# Patient Record
Sex: Female | Born: 1960
Health system: Southern US, Community
[De-identification: ages and names within clinical notes are randomized; demographics above are authoritative.]

## PROBLEM LIST (undated history)

## (undated) DIAGNOSIS — M1990 Unspecified osteoarthritis, unspecified site: Secondary | ICD-10-CM

## (undated) DIAGNOSIS — G4730 Sleep apnea, unspecified: Secondary | ICD-10-CM

## (undated) DIAGNOSIS — I219 Acute myocardial infarction, unspecified: Secondary | ICD-10-CM

## (undated) DIAGNOSIS — M359 Systemic involvement of connective tissue, unspecified: Secondary | ICD-10-CM

## (undated) DIAGNOSIS — E785 Hyperlipidemia, unspecified: Secondary | ICD-10-CM

## (undated) DIAGNOSIS — K635 Polyp of colon: Secondary | ICD-10-CM

## (undated) DIAGNOSIS — K219 Gastro-esophageal reflux disease without esophagitis: Secondary | ICD-10-CM

## (undated) DIAGNOSIS — Z9981 Dependence on supplemental oxygen: Secondary | ICD-10-CM

## (undated) DIAGNOSIS — I10 Essential (primary) hypertension: Secondary | ICD-10-CM

## (undated) DIAGNOSIS — I2510 Atherosclerotic heart disease of native coronary artery without angina pectoris: Secondary | ICD-10-CM

## (undated) DIAGNOSIS — K2270 Barrett's esophagus without dysplasia: Secondary | ICD-10-CM

## (undated) DIAGNOSIS — R079 Chest pain, unspecified: Secondary | ICD-10-CM

## (undated) DIAGNOSIS — F419 Anxiety disorder, unspecified: Secondary | ICD-10-CM

## (undated) DIAGNOSIS — M5430 Sciatica, unspecified side: Secondary | ICD-10-CM

## (undated) DIAGNOSIS — F32A Depression, unspecified: Secondary | ICD-10-CM

## (undated) DIAGNOSIS — Z720 Tobacco use: Secondary | ICD-10-CM

## (undated) DIAGNOSIS — J45909 Unspecified asthma, uncomplicated: Secondary | ICD-10-CM

## (undated) DIAGNOSIS — F329 Major depressive disorder, single episode, unspecified: Secondary | ICD-10-CM

## (undated) HISTORY — PX: COLONOSCOPY W/ POLYPECTOMY: SHX1380

## (undated) HISTORY — PX: DILATION AND CURETTAGE, DIAGNOSTIC / THERAPEUTIC: SUR384

## (undated) HISTORY — PX: COLONOSCOPY: SHX174

## (undated) HISTORY — DX: Barrett's esophagus without dysplasia: K22.70

## (undated) HISTORY — DX: Polyp of colon: K63.5

## (undated) HISTORY — DX: Tobacco use: Z72.0

## (undated) HISTORY — DX: Chest pain, unspecified: R07.9

## (undated) HISTORY — DX: Hyperlipidemia, unspecified: E78.5

---

## 2003-10-09 HISTORY — PX: CARDIAC CATHETERIZATION: SHX172

## 2007-05-06 ENCOUNTER — Emergency Department (HOSPITAL_COMMUNITY): Admission: EM | Admit: 2007-05-06 | Discharge: 2007-05-06 | Payer: Self-pay | Admitting: Emergency Medicine

## 2007-07-25 ENCOUNTER — Emergency Department (HOSPITAL_COMMUNITY): Admission: EM | Admit: 2007-07-25 | Discharge: 2007-07-25 | Payer: Self-pay | Admitting: Emergency Medicine

## 2007-09-21 ENCOUNTER — Encounter (INDEPENDENT_AMBULATORY_CARE_PROVIDER_SITE_OTHER): Payer: Self-pay | Admitting: Family Medicine

## 2007-09-21 ENCOUNTER — Ambulatory Visit: Payer: Self-pay | Admitting: Internal Medicine

## 2007-09-21 LAB — CONVERTED CEMR LAB
BUN: 13 mg/dL (ref 6–23)
Basophils Relative: 0 % (ref 0–1)
CO2: 22 meq/L (ref 19–32)
Calcium: 8.9 mg/dL (ref 8.4–10.5)
Chloride: 106 meq/L (ref 96–112)
Cholesterol: 221 mg/dL — ABNORMAL HIGH (ref 0–200)
Creatinine, Ser: 1.06 mg/dL (ref 0.40–1.20)
Eosinophils Relative: 1 % (ref 0–5)
HCT: 43.6 % (ref 36.0–46.0)
HDL: 50 mg/dL (ref >39)
Hemoglobin: 14.5 g/dL (ref 12.0–15.0)
Lymphocytes Relative: 18 % (ref 12–46)
MCHC: 33.3 g/dL (ref 30.0–36.0)
Monocytes Absolute: 0.5 10*3/uL (ref 0.2–0.7)
Monocytes Relative: 5 % (ref 3–11)
Neutro Abs: 7.8 10*3/uL — ABNORMAL HIGH (ref 1.7–7.7)
RBC: 5.02 M/uL (ref 3.87–5.11)
Total Bilirubin: 0.5 mg/dL (ref 0.3–1.2)
Total CHOL/HDL Ratio: 4.4

## 2007-09-23 ENCOUNTER — Ambulatory Visit: Payer: Self-pay | Admitting: *Deleted

## 2007-10-09 ENCOUNTER — Inpatient Hospital Stay (HOSPITAL_COMMUNITY): Admission: EM | Admit: 2007-10-09 | Discharge: 2007-10-12 | Payer: Self-pay | Admitting: Emergency Medicine

## 2007-10-10 HISTORY — PX: CARDIAC CATHETERIZATION: SHX172

## 2007-10-28 ENCOUNTER — Inpatient Hospital Stay (HOSPITAL_COMMUNITY): Admission: EM | Admit: 2007-10-28 | Discharge: 2007-11-02 | Payer: Self-pay | Admitting: Emergency Medicine

## 2007-10-31 HISTORY — PX: CORONARY ANGIOPLASTY WITH STENT PLACEMENT: SHX49

## 2007-11-07 ENCOUNTER — Ambulatory Visit: Payer: Self-pay | Admitting: Internal Medicine

## 2007-11-07 LAB — CONVERTED CEMR LAB
ALT: 15 units/L (ref 0–35)
Albumin: 4.4 g/dL (ref 3.5–5.2)
BUN: 21 mg/dL (ref 6–23)
CO2: 25 meq/L (ref 19–32)
Chloride: 105 meq/L (ref 96–112)
Cholesterol: 201 mg/dL — ABNORMAL HIGH (ref 0–200)
Creatinine, Ser: 1.02 mg/dL (ref 0.40–1.20)
Glucose, Bld: 85 mg/dL (ref 70–99)
HDL: 56 mg/dL (ref >39)
Sodium: 141 meq/L (ref 135–145)
Total Bilirubin: 0.4 mg/dL (ref 0.3–1.2)
Total CHOL/HDL Ratio: 3.6
Triglycerides: 114 mg/dL (ref <150)
VLDL: 23 mg/dL (ref 0–40)

## 2008-02-08 ENCOUNTER — Ambulatory Visit: Payer: Self-pay | Admitting: Internal Medicine

## 2008-02-08 ENCOUNTER — Encounter: Payer: Self-pay | Admitting: Family Medicine

## 2008-02-28 ENCOUNTER — Ambulatory Visit (HOSPITAL_COMMUNITY): Admission: RE | Admit: 2008-02-28 | Discharge: 2008-02-28 | Payer: Self-pay | Admitting: Family Medicine

## 2008-03-23 ENCOUNTER — Emergency Department (HOSPITAL_COMMUNITY): Admission: EM | Admit: 2008-03-23 | Discharge: 2008-03-23 | Payer: Self-pay | Admitting: Emergency Medicine

## 2008-05-26 ENCOUNTER — Inpatient Hospital Stay (HOSPITAL_COMMUNITY): Admission: EM | Admit: 2008-05-26 | Discharge: 2008-05-30 | Payer: Self-pay | Admitting: Emergency Medicine

## 2008-05-28 HISTORY — PX: CARDIAC CATHETERIZATION: SHX172

## 2008-06-04 ENCOUNTER — Ambulatory Visit: Payer: Self-pay | Admitting: Family Medicine

## 2008-06-18 ENCOUNTER — Emergency Department (HOSPITAL_COMMUNITY): Admission: EM | Admit: 2008-06-18 | Discharge: 2008-06-18 | Payer: Self-pay | Admitting: Emergency Medicine

## 2008-07-16 ENCOUNTER — Emergency Department (HOSPITAL_COMMUNITY): Admission: EM | Admit: 2008-07-16 | Discharge: 2008-07-16 | Payer: Self-pay | Admitting: Emergency Medicine

## 2009-01-06 ENCOUNTER — Emergency Department (HOSPITAL_COMMUNITY): Admission: EM | Admit: 2009-01-06 | Discharge: 2009-01-06 | Payer: Self-pay | Admitting: Emergency Medicine

## 2009-02-04 ENCOUNTER — Ambulatory Visit: Payer: Self-pay | Admitting: Internal Medicine

## 2009-02-04 ENCOUNTER — Encounter (INDEPENDENT_AMBULATORY_CARE_PROVIDER_SITE_OTHER): Payer: Self-pay | Admitting: Adult Health

## 2009-02-04 LAB — CONVERTED CEMR LAB
Alkaline Phosphatase: 72 units/L (ref 39–117)
Basophils Absolute: 0 10*3/uL (ref 0.0–0.1)
Basophils Relative: 0 % (ref 0–1)
Glucose, Bld: 75 mg/dL (ref 70–99)
HDL: 55 mg/dL (ref >39)
Hemoglobin: 14.7 g/dL (ref 12.0–15.0)
LDL Cholesterol: 129 mg/dL — ABNORMAL HIGH (ref 0–99)
MCHC: 34.1 g/dL (ref 30.0–36.0)
Monocytes Absolute: 0.5 10*3/uL (ref 0.1–1.0)
Neutro Abs: 5.9 10*3/uL (ref 1.7–7.7)
Neutrophils Relative %: 72 % (ref 43–77)
RDW: 15.7 % — ABNORMAL HIGH (ref 11.5–15.5)
Sodium: 138 meq/L (ref 135–145)
Total Bilirubin: 0.3 mg/dL (ref 0.3–1.2)
Total CHOL/HDL Ratio: 4.1
Total Protein: 7.4 g/dL (ref 6.0–8.3)
Triglycerides: 220 mg/dL — ABNORMAL HIGH (ref <150)
VLDL: 44 mg/dL — ABNORMAL HIGH (ref 0–40)

## 2009-02-07 ENCOUNTER — Ambulatory Visit: Payer: Self-pay | Admitting: Internal Medicine

## 2009-02-25 ENCOUNTER — Ambulatory Visit: Payer: Self-pay | Admitting: Internal Medicine

## 2009-02-28 ENCOUNTER — Ambulatory Visit (HOSPITAL_COMMUNITY): Admission: RE | Admit: 2009-02-28 | Discharge: 2009-02-28 | Payer: Self-pay | Admitting: Internal Medicine

## 2009-05-10 ENCOUNTER — Encounter (INDEPENDENT_AMBULATORY_CARE_PROVIDER_SITE_OTHER): Payer: Self-pay | Admitting: Adult Health

## 2009-05-10 ENCOUNTER — Ambulatory Visit: Payer: Self-pay | Admitting: Internal Medicine

## 2009-05-15 ENCOUNTER — Emergency Department (HOSPITAL_COMMUNITY): Admission: EM | Admit: 2009-05-15 | Discharge: 2009-05-15 | Payer: Self-pay | Admitting: Emergency Medicine

## 2009-05-20 ENCOUNTER — Emergency Department (HOSPITAL_COMMUNITY): Admission: EM | Admit: 2009-05-20 | Discharge: 2009-05-20 | Payer: Self-pay | Admitting: Emergency Medicine

## 2009-05-23 ENCOUNTER — Emergency Department (HOSPITAL_COMMUNITY): Admission: EM | Admit: 2009-05-23 | Discharge: 2009-05-23 | Payer: Self-pay | Admitting: Emergency Medicine

## 2009-08-23 HISTORY — PX: NM MYOCAR PERF WALL MOTION: HXRAD629

## 2009-08-26 ENCOUNTER — Inpatient Hospital Stay (HOSPITAL_COMMUNITY): Admission: EM | Admit: 2009-08-26 | Discharge: 2009-08-27 | Payer: Self-pay | Admitting: Emergency Medicine

## 2009-09-09 ENCOUNTER — Ambulatory Visit: Payer: Self-pay | Admitting: Internal Medicine

## 2009-09-15 HISTORY — PX: CARDIAC CATHETERIZATION: SHX172

## 2009-09-20 ENCOUNTER — Emergency Department (HOSPITAL_COMMUNITY): Admission: EM | Admit: 2009-09-20 | Discharge: 2009-09-20 | Payer: Self-pay | Admitting: Family Medicine

## 2009-09-20 ENCOUNTER — Telehealth (INDEPENDENT_AMBULATORY_CARE_PROVIDER_SITE_OTHER): Payer: Self-pay | Admitting: *Deleted

## 2009-10-09 ENCOUNTER — Ambulatory Visit: Payer: Self-pay | Admitting: Internal Medicine

## 2009-10-31 ENCOUNTER — Encounter (INDEPENDENT_AMBULATORY_CARE_PROVIDER_SITE_OTHER): Payer: Self-pay | Admitting: Adult Health

## 2009-10-31 ENCOUNTER — Ambulatory Visit: Payer: Self-pay | Admitting: Internal Medicine

## 2009-10-31 LAB — CONVERTED CEMR LAB
BUN: 22 mg/dL (ref 6–23)
CO2: 21 meq/L (ref 19–32)
Cholesterol: 184 mg/dL (ref 0–200)
Creatinine, Ser: 1.21 mg/dL — ABNORMAL HIGH (ref 0.40–1.20)
Glucose, Bld: 91 mg/dL (ref 70–99)
Total Bilirubin: 0.5 mg/dL (ref 0.3–1.2)
Total CHOL/HDL Ratio: 4.2
Triglycerides: 127 mg/dL (ref <150)
VLDL: 25 mg/dL (ref 0–40)

## 2009-12-13 ENCOUNTER — Emergency Department (HOSPITAL_COMMUNITY): Admission: EM | Admit: 2009-12-13 | Discharge: 2009-12-13 | Payer: Self-pay | Admitting: Family Medicine

## 2009-12-26 ENCOUNTER — Ambulatory Visit: Payer: Self-pay | Admitting: Family Medicine

## 2010-01-29 ENCOUNTER — Ambulatory Visit: Payer: Self-pay | Admitting: Internal Medicine

## 2010-03-03 ENCOUNTER — Ambulatory Visit (HOSPITAL_COMMUNITY): Admission: RE | Admit: 2010-03-03 | Discharge: 2010-03-03 | Payer: Self-pay | Admitting: Internal Medicine

## 2010-03-28 ENCOUNTER — Ambulatory Visit: Payer: Self-pay | Admitting: Family Medicine

## 2010-03-28 ENCOUNTER — Encounter (INDEPENDENT_AMBULATORY_CARE_PROVIDER_SITE_OTHER): Payer: Self-pay | Admitting: Adult Health

## 2010-03-28 ENCOUNTER — Emergency Department (HOSPITAL_COMMUNITY): Admission: EM | Admit: 2010-03-28 | Discharge: 2010-03-28 | Payer: Self-pay | Admitting: Emergency Medicine

## 2010-03-28 LAB — CONVERTED CEMR LAB
ALT: 18 units/L (ref 0–35)
Amphetamine Screen, Ur: NEGATIVE
BUN: 19 mg/dL (ref 6–23)
Barbiturate Quant, Ur: NEGATIVE
Basophils Relative: 0 % (ref 0–1)
CO2: 28 meq/L (ref 19–32)
Cholesterol: 154 mg/dL (ref 0–200)
Cocaine Metabolites: NEGATIVE
Creatinine, Ser: 1.11 mg/dL (ref 0.40–1.20)
Eosinophils Absolute: 0.1 10*3/uL (ref 0.0–0.7)
Eosinophils Relative: 2 % (ref 0–5)
HDL: 47 mg/dL (ref >39)
MCHC: 33.2 g/dL (ref 30.0–36.0)
MCV: 86.3 fL (ref 78.0–100.0)
Marijuana Metabolite: POSITIVE — AB
Monocytes Relative: 4 % (ref 3–12)
Neutrophils Relative %: 70 % (ref 43–77)
Opiate Screen, Urine: POSITIVE — AB
Phencyclidine (PCP): NEGATIVE
Platelets: 257 10*3/uL (ref 150–400)
Rhuematoid fact SerPl-aCnc: <20 intl units/mL (ref 0–20)
Total Bilirubin: 0.3 mg/dL (ref 0.3–1.2)
Total CHOL/HDL Ratio: 3.3
VLDL: 45 mg/dL — ABNORMAL HIGH (ref 0–40)

## 2010-06-16 ENCOUNTER — Ambulatory Visit: Payer: Self-pay | Admitting: Internal Medicine

## 2010-06-16 ENCOUNTER — Encounter (INDEPENDENT_AMBULATORY_CARE_PROVIDER_SITE_OTHER): Payer: Self-pay | Admitting: Adult Health

## 2010-06-16 LAB — CONVERTED CEMR LAB
Amphetamine Screen, Ur: NEGATIVE
Barbiturate Quant, Ur: NEGATIVE
Cocaine Metabolites: NEGATIVE
Marijuana Metabolite: POSITIVE — AB
Methadone: NEGATIVE
Opiate Screen, Urine: NEGATIVE

## 2010-09-10 ENCOUNTER — Encounter (INDEPENDENT_AMBULATORY_CARE_PROVIDER_SITE_OTHER): Payer: Self-pay | Admitting: Internal Medicine

## 2010-09-10 LAB — CONVERTED CEMR LAB
AST: 16 units/L (ref 0–37)
Albumin: 4.4 g/dL (ref 3.5–5.2)
Alkaline Phosphatase: 64 units/L (ref 39–117)
BUN: 16 mg/dL (ref 6–23)
Basophils Relative: 0 % (ref 0–1)
Benzodiazepines.: NEGATIVE
Calcium: 9.6 mg/dL (ref 8.4–10.5)
Chloride: 100 meq/L (ref 96–112)
Cocaine Metabolites: NEGATIVE
Creatinine, Ser: 1.01 mg/dL (ref 0.40–1.20)
Creatinine,U: 130.6 mg/dL
Eosinophils Absolute: 0.1 10*3/uL (ref 0.0–0.7)
Glucose, Bld: 81 mg/dL (ref 70–99)
Hemoglobin: 14.3 g/dL (ref 12.0–15.0)
Lymphs Abs: 1.8 10*3/uL (ref 0.7–4.0)
MCHC: 35 g/dL (ref 30.0–36.0)
MCV: 84.5 fL (ref 78.0–100.0)
Monocytes Absolute: 0.5 10*3/uL (ref 0.1–1.0)
Monocytes Relative: 7 % (ref 3–12)
Neutrophils Relative %: 66 % (ref 43–77)
Phencyclidine (PCP): NEGATIVE
Potassium: 3.6 meq/L (ref 3.5–5.3)
Propoxyphene: NEGATIVE
RBC: 4.84 M/uL (ref 3.87–5.11)
WBC: 7.3 10*3/uL (ref 4.0–10.5)

## 2010-09-11 ENCOUNTER — Encounter (INDEPENDENT_AMBULATORY_CARE_PROVIDER_SITE_OTHER): Payer: Self-pay | Admitting: *Deleted

## 2010-09-16 ENCOUNTER — Emergency Department (HOSPITAL_COMMUNITY)
Admission: EM | Admit: 2010-09-16 | Discharge: 2010-09-16 | Payer: Self-pay | Source: Home / Self Care | Admitting: Emergency Medicine

## 2010-10-13 ENCOUNTER — Encounter (INDEPENDENT_AMBULATORY_CARE_PROVIDER_SITE_OTHER): Payer: Self-pay | Admitting: *Deleted

## 2010-11-25 ENCOUNTER — Encounter (INDEPENDENT_AMBULATORY_CARE_PROVIDER_SITE_OTHER): Payer: Self-pay | Admitting: *Deleted

## 2010-12-25 NOTE — Letter (Signed)
Summary: Referral - not able to see patient  [facility]  [address] [place], [place] [address]   Phone: [phone]  Fax: [phone]        November 25, 2010    [facility] [address] [place], [place] [address]    Re:   Tracey Manning DOB:  Dec 31, 1960 MRN:   [id number]    Dear Dala Dock:  Thank you for your kind referral of the above patient.  We have attempted to schedule the recommended Consultation but have not been able to schedule because:  X   The patient was not available by phone and/or has not returned our calls.  ___ The patient declined to schedule the procedure at this time.  We appreciate the referral and hope that we will have the opportunity to treat this patient in the future.    Sincerely,    [facility] [phone]

## 2011-02-04 LAB — WET PREP, GENITAL
Clue Cells Wet Prep HPF POC: NONE SEEN
Trich, Wet Prep: NONE SEEN
Yeast Wet Prep HPF POC: NONE SEEN

## 2011-02-04 LAB — URINALYSIS, ROUTINE W REFLEX MICROSCOPIC
Nitrite: NEGATIVE
Protein, ur: NEGATIVE mg/dL
Specific Gravity, Urine: 1.006 (ref 1.005–1.030)
Urobilinogen, UA: 0.2 mg/dL (ref 0.0–1.0)

## 2011-02-04 LAB — COMPREHENSIVE METABOLIC PANEL
ALT: 33 U/L (ref 0–35)
AST: 25 U/L (ref 0–37)
Albumin: 3.7 g/dL (ref 3.5–5.2)
Alkaline Phosphatase: 64 U/L (ref 39–117)
CO2: 29 mEq/L (ref 19–32)
Calcium: 9.7 mg/dL (ref 8.4–10.5)
Chloride: 98 mEq/L (ref 96–112)
Total Protein: 7.2 g/dL (ref 6.0–8.3)

## 2011-02-04 LAB — DIFFERENTIAL
Basophils Relative: 0 % (ref 0–1)
Eosinophils Absolute: 0.1 10*3/uL (ref 0.0–0.7)
Eosinophils Relative: 1 % (ref 0–5)
Lymphs Abs: 1.7 10*3/uL (ref 0.7–4.0)
Monocytes Absolute: 0.5 10*3/uL (ref 0.1–1.0)
Monocytes Relative: 7 % (ref 3–12)
Neutrophils Relative %: 71 % (ref 43–77)

## 2011-02-04 LAB — CBC
MCHC: 34.2 g/dL (ref 30.0–36.0)
Platelets: 217 10*3/uL (ref 150–400)
RDW: 15.7 % — ABNORMAL HIGH (ref 11.5–15.5)
WBC: 8 10*3/uL (ref 4.0–10.5)

## 2011-02-04 LAB — POCT PREGNANCY, URINE: Preg Test, Ur: NEGATIVE

## 2011-02-04 LAB — GC/CHLAMYDIA PROBE AMP, GENITAL: GC Probe Amp, Genital: NEGATIVE

## 2011-02-26 LAB — CBC
HCT: 33 % — ABNORMAL LOW (ref 36.0–46.0)
Hemoglobin: 11.4 g/dL — ABNORMAL LOW (ref 12.0–15.0)
Hemoglobin: 12.5 g/dL (ref 12.0–15.0)
MCHC: 34.5 g/dL (ref 30.0–36.0)
RBC: 4.17 MIL/uL (ref 3.87–5.11)
RBC: 4.44 MIL/uL (ref 3.87–5.11)
RDW: 15.7 % — ABNORMAL HIGH (ref 11.5–15.5)
WBC: 5.4 10*3/uL (ref 4.0–10.5)
WBC: 8.8 10*3/uL (ref 4.0–10.5)

## 2011-02-26 LAB — BASIC METABOLIC PANEL
BUN: 10 mg/dL (ref 6–23)
BUN: 11 mg/dL (ref 6–23)
CO2: 26 mEq/L (ref 19–32)
Calcium: 8.6 mg/dL (ref 8.4–10.5)
Calcium: 9.1 mg/dL (ref 8.4–10.5)
Creatinine, Ser: 0.98 mg/dL (ref 0.4–1.2)
Creatinine, Ser: 1.04 mg/dL (ref 0.4–1.2)
GFR calc Af Amer: >60 mL/min (ref >60)
GFR calc non Af Amer: 57 mL/min — ABNORMAL LOW (ref >60)
GFR calc non Af Amer: >60 mL/min (ref >60)
GFR calc non Af Amer: >60 mL/min (ref >60)
Glucose, Bld: 84 mg/dL (ref 70–99)
Glucose, Bld: 95 mg/dL (ref 70–99)
Sodium: 136 mEq/L (ref 135–145)
Sodium: 136 mEq/L (ref 135–145)

## 2011-02-26 LAB — COMPREHENSIVE METABOLIC PANEL
ALT: 11 U/L (ref 0–35)
Alkaline Phosphatase: 57 U/L (ref 39–117)
CO2: 25 mEq/L (ref 19–32)
Chloride: 102 mEq/L (ref 96–112)
GFR calc non Af Amer: 59 mL/min — ABNORMAL LOW (ref >60)
Glucose, Bld: 92 mg/dL (ref 70–99)
Potassium: 2.9 mEq/L — ABNORMAL LOW (ref 3.5–5.1)
Sodium: 137 mEq/L (ref 135–145)
Total Bilirubin: 0.6 mg/dL (ref 0.3–1.2)
Total Protein: 6.4 g/dL (ref 6.0–8.3)

## 2011-02-26 LAB — DIFFERENTIAL
Basophils Absolute: 0 10*3/uL (ref 0.0–0.1)
Basophils Absolute: 0 10*3/uL (ref 0.0–0.1)
Basophils Relative: 0 % (ref 0–1)
Basophils Relative: 0 % (ref 0–1)
Eosinophils Absolute: 0.2 10*3/uL (ref 0.0–0.7)
Lymphocytes Relative: 20 % (ref 12–46)
Monocytes Relative: 7 % (ref 3–12)
Neutro Abs: 6.3 10*3/uL (ref 1.7–7.7)
Neutrophils Relative %: 64 % (ref 43–77)
Neutrophils Relative %: 71 % (ref 43–77)

## 2011-02-26 LAB — PROTIME-INR: INR: 1 (ref 0.00–1.49)

## 2011-02-26 LAB — POCT CARDIAC MARKERS: Myoglobin, poc: 72.4 ng/mL (ref 12–200)

## 2011-02-26 LAB — CARDIAC PANEL(CRET KIN+CKTOT+MB+TROPI)
CK, MB: 1.2 ng/mL (ref 0.3–4.0)
Relative Index: INVALID (ref 0.0–2.5)
Total CK: 72 U/L (ref 7–177)
Total CK: 78 U/L (ref 7–177)

## 2011-02-26 LAB — HEPARIN LEVEL (UNFRACTIONATED): Heparin Unfractionated: 1.43 IU/mL — ABNORMAL HIGH (ref 0.30–0.70)

## 2011-02-26 LAB — HEMOGLOBIN A1C: Mean Plasma Glucose: 117 mg/dL

## 2011-03-01 LAB — URINALYSIS, ROUTINE W REFLEX MICROSCOPIC
Bilirubin Urine: NEGATIVE
Hgb urine dipstick: NEGATIVE
Specific Gravity, Urine: 1.021 (ref 1.005–1.030)
pH: 6 (ref 5.0–8.0)

## 2011-03-01 LAB — URINE MICROSCOPIC-ADD ON

## 2011-03-10 LAB — COMPREHENSIVE METABOLIC PANEL
ALT: 15 U/L (ref 0–35)
Calcium: 8.9 mg/dL (ref 8.4–10.5)
Creatinine, Ser: 1.03 mg/dL (ref 0.4–1.2)
Glucose, Bld: 99 mg/dL (ref 70–99)
Sodium: 138 mEq/L (ref 135–145)
Total Protein: 7 g/dL (ref 6.0–8.3)

## 2011-03-10 LAB — URINALYSIS, ROUTINE W REFLEX MICROSCOPIC
Ketones, ur: NEGATIVE mg/dL
Nitrite: NEGATIVE
Protein, ur: NEGATIVE mg/dL

## 2011-03-10 LAB — CBC
Hemoglobin: 14.3 g/dL (ref 12.0–15.0)
MCHC: 35 g/dL (ref 30.0–36.0)
RDW: 15.7 % — ABNORMAL HIGH (ref 11.5–15.5)

## 2011-03-25 ENCOUNTER — Other Ambulatory Visit (HOSPITAL_COMMUNITY): Payer: Self-pay | Admitting: Family Medicine

## 2011-03-25 DIAGNOSIS — Z1231 Encounter for screening mammogram for malignant neoplasm of breast: Secondary | ICD-10-CM

## 2011-04-07 NOTE — Discharge Summary (Signed)
Tracey Manning, Tracey Manning             ACCOUNT NO.:  [id number]   MEDICAL RECORD NO.:  [id number]          PATIENT TYPE:  INP   LOCATION:  2029                         FACILITY:  [facility]   PHYSICIAN:  Nicki Guadalajara, M.D.     DATE OF BIRTH:  May 09, 1961   DATE OF ADMISSION:  05/26/2008  DATE OF DISCHARGE:  05/30/2008                               DISCHARGE SUMMARY   DISCHARGE DIAGNOSES:  1. Chest pain, unstable angina resolved.      a.     Negative myocardial infarction.  2. Coronary disease with cardiac catheterization this admission with      stable coronary, stable stent, all patent.  3. Sinus bradycardia, due to increase of Toprol.  We have decreased      down to 25.  4. Hypertension.  5. Left back pain with radiation to the left mid quadrant and down      left leg.  6. Hyperlipidemia.  7. Tobacco use, ongoing.  8. Depression anxiety, multiple home stressors.   DISCHARGE CONDITION:  Improved and stable.   PROCEDURES:  May 28, 2008 combined left heart cath by Dr. Yates Decamp with  very mild decrease in EF 50%-55% with 100% RCA stenosis which is old  with left-to-right collaterals.  Her circumflex stent Taxus is stable  and nonobstructive disease of the LAD.  Please note, the RCA is also  very small.   DISCHARGE MEDICATIONS:  1. Lisinopril 10 mg daily.  2. Lipitor 80 mg daily.  3. Aspirin 325 mg daily.  4. Plavix 75 mg daily.  5. Toprol-XL 25 mg daily.  6. Norvasc 5 mg daily.  7. Nitroglycerin sublingual as needed for chest pain.  8. Flexeril 5 mg one every 8 hours.  9. Vicodin 5/500 one to two every 6 hours as needed for back pain.  10.Zantac 150 mg one twice a day.  11.Nicotrol Inhaler as needed.   DISCHARGE INSTRUCTIONS:  1. May return to work June 07, 2008 if stable.  2. Low-sodium heart-healthy diet.  3. Increase activity slowly.  May shower or bathe.  No lifting for 1      week.  No driving for 2 days.  4. Wash cath site with soap and water.  Call if any bleeding,   swelling, or drainage.  5. Follow up with Dr. Tresa Endo, June 25, 2008 at 10 a.m.  6. Follow up with [facility] on June 04, 2008 as previously arranged.  7. Dr. Landry Dyke office will schedule orthopedic appointment for you and      will call you at home with the date and the time and the location.   HISTORY OF PRESENT ILLNESS:  This is a 50 year old African American  female with known coronary disease with a Taxus stent in the circumflex  came to the emergency room on May 26, 2008 with chest pain ongoing for  approximately a month.  It was worse on the day of admission and occurs  all day.  Today on the day of admission, she took one nitro and the  chest pain was relieved.  Pain was a pressure-type pain in the middle of  her chest described as 8/10 and awakened from her sleep.  That was even  worse later in the day, tried to go to work but was unable to work, and  she then came to the emergency room.   PAST MEDICAL HISTORY:  Last cath was in December 2008.  She has had  Taxus stent in the distal circumflex.  She still has a small RCA that  was occluded and a 60% mid LAD stenosis, though that had improved on  this cardiac cath this admission.  Other history of hypertension,  dyslipidemia, history of asthma, and tobacco use, on going.   MEDICATIONS:  Outpatient meds essentially the same as discharge, though  we did add back pain medications as well as Zantac to see if that will  control any chest pain she may have had from reflux.   ALLERGIES:  No known allergies.   FAMILY HISTORY/SOCIAL HISTORY/REVIEW OF SYSTEMS:  See H&P.   PHYSICAL EXAMINATION:  GENERAL:  Alert, oriented African American  female.  SKIN:  Warm and dry.  Brisk capillary refill.  EYES:  Sclerae clear.  NECK:  Supple.  No JVD.  HEART:  S1 and S2.  Regular rate and rhythm.  LUNGS:  Clear.  ABDOMEN:  Soft and nontender.   She continued to complain of back pain that improved with Vicodin the  night prior to discharge.   She was ambulating up and down the hall  without difficulty on the Vicodin.   The patient does relate the morphine helps more so with back pain than  the Vicodin does, but we added Flexeril as well.   LABORATORY DATA:  Hemoglobin was 13.7 on admission, 39.8 hematocrit, WBC  was 8.1, and platelets were 245, these remained stable throughout  hospitalization.  PT on admission was 14.8, INR of 1.1 on heparin, PTT  was greater than 200, and her heparin levels were 1.03 and therapeutic.  D-dimer was 0.24 and negative.  Chemistry, sodium 136, potassium 3.8,  chloride 103, CO2 26, glucose 89, BUN 12, creatinine 0.91, total bili  0.9, alkaline phos 63, SGOT 22, SGPT 28, total protein 7.5, albumin 4.2,  and calcium 9.4 and prior to discharge these remained same.  Magnesium  was 2.1, hemoglobin A1c was 5.9, and cardiac enzymes were negative.  CKs  128, 109, 119.  MBs 1.8, 1.7, 1.5, and troponin Is were negative at  0.02.  Urine study, she had 2 UAs done that were both negative.  TSH was  0.841.   Lipid panel, total cholesterol 115, triglycerides 43, HDL 44, LDL 62.   RADIOLOGY:  Chest x-ray on admission, cardiopericardial silhouette is  enlarged.  Lungs are clear.  No focal air space disease is evident.  The  visualized soft tissues and bony thorax were unremarkable.  A  cardiomegaly without failure.   MRI of the spine without contrast, advanced multilevel facet  degeneration for age, worst at L3-L4 and L5-S1.  These are a possible  source for local low back pain which can radiate.  Multifactorial  moderate L5-S1 right lateral recess stenosis, correlate for right S1  radiculitis and severe right L5 neural foraminal stenosis, correlate for  right L5 radiculitis, mild multifactorial L3-L4 lateral recess, and L4-  L5 neural foraminal stenosis.  EKG is sinus bradycardia of 42, no acute  changes.   HOSPITAL COURSE:  The patient was admitted with chest pain, though she  did state she had some back  pain on admission, was ruled out for  myocardial  infarction, and underwent cardiac catheterization with her  history of coronary disease.  Cath as stated previously stable, though  she needs to continue on statins, hypertension medicine including beta  blocker and lisinopril.  Her back pain increased prior to the cath and  on the next day and she was taking morphine frequently.  We changed  morphine to Vicodin and Flexeril which did not relieve it nearly as long  as the morphine, but she did obtained some pain control with this.  We  then did the MRI with results as stated.  She will follow up with [facility]  orthopedics and we will go from there concerning her back pain.   During the hospitalization, her husband was quite concerned about her  diet.  She eats what she wants including a lot of red meat.  I did have  nutritionist see both of them and discussed what she should and should  not eat and this relieved some of the stress.  She will follow up with  [facility].  There are multiple issues within the family, I believe her  2 sons are ill or in dire predicaments.  She will follow up with  [facility].  She may need SSRI which may help her back pain as well  though I do think with her amount of disease she needs further  management.  Dr. Jacinto Halim saw her on the day of discharge.  She will follow  up with Dr. Tresa Endo.  She was agreeable to the discharge.      Darcella Gasman. Annie Paras, N.P.    ______________________________  Nicki Guadalajara, M.D.    LRI/MEDQ  D:  05/30/2008  T:  05/31/2008  Job:  [id number]   cc:   [facility]  [facility]

## 2011-04-07 NOTE — Cardiovascular Report (Signed)
Tracey Manning             ACCOUNT NO.:  [id number]   MEDICAL RECORD NO.:  [id number]          PATIENT TYPE:  INP   LOCATION:  2922                         FACILITY:  [facility]   PHYSICIAN:  Tracey Manning, M.D.     DATE OF BIRTH:  03-Jul-1961   DATE OF PROCEDURE:  10/31/2007  DATE OF DISCHARGE:                            CARDIAC CATHETERIZATION   INDICATIONS:  Tracey Manning is a 50 year old, African American  female with a history of documented coronary artery disease by initial  cardiac catheterization several years ago in [place], [place].  She was  seen approximately 3 weeks ago and admitted for chest pain.  She was new  to our group at that time.  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  She also had diffuse 80% circumflex stenosis  distally and 70% septal stenosis with 60-70% mid LAD stenosis.  There  were left-to-right collaterals to her RCA.  She was started on medical  therapy.  She was readmitted to [facility] on Friday, October 28, 2007, with recurrent chest pain compatible with unstable angina.  Her  symptoms ultimately improved with heparinization and nitroglycerin.  She  also was started on additional medical therapy.  She did have recurrent  chest pain while hospitalized.  Repeat catheterization is performed  today with possible attempt at percutaneous coronary intervention.   PROCEDURE:  After premedication with Versed intravenously, 2 mg  initially with an additional 1 mg, the patient was prepped and draped in  usual fashion.  The right femoral artery was punctured anteriorly and a  5-French sheath was inserted without difficulty.  Diagnostic  catheterization was done with 5-French, Judkins-4, left coronary  catheters.  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  Pigtail catheter was used for RAO  ventriculography.  At this point, it was felt that an attempt should be  made in light of her recurrence symptomatology to try opening up the  very proximal RCA, if possible, and certainly to try opening up the  distal circumflex.  Of note, with the initial injection of her left  system, her large first marginal vessel appeared significantly stenotic,  but following intracoronary nitroglycerin, this has almost normalized,  but there was no change in the distal circumflex stenoses which remained  approximately 90%.  The 5-French sheath was upgraded to a 6-French  system.  Bivalirudin was administered.  In anticipation of her  procedure, the patient was given Plavix 150 mg on Saturday, an  additional 150 mg on Sunday yesterday and received her Plavix this  morning.  Attention was first directed at possibly performing PTCA to  the small proximal high-grade RCA stenosis in the anomalous origin  vessel.  Multiple attempts at selectively engaging the right coronary  artery with a 6-French system, however, were unsuccessful including an  AL-1 with side holes, an AL-0.75, an AR-1, an AL-2, a hockey-stick and a  Proofreader.  At this point, the decision was made not to attempt  intervention to the RCA due to inability to selectively engage the  anomalous takeoff with the guiding catheter.   Attention was then directed at the left circumflex system.  A Voda 3.5,  left guide was inserted.  A Prowater wire was advanced down to the  distal circumflex.  ACT was documented to be therapeutic throughout the  procedure.  Predilatation was done with a 2.0 x 20-mm, Maverick balloon.  A 2.25 x 24-mm, Taxus Adam stent was then successfully deployed up to 11  atmospheres in this distal circumflex segment.  Poststent dilatation was  done utilizing a 2.5 x 15-mm, Quantum balloon with planned with no with  stent taper from 2.3 distally to up to 2.41  in the proximal area of the  stent.  There was excellent angiographic result.  There was no evidence  for dissection.  The patient tolerated the procedure well.   HEMODYNAMIC DATA:  Central aortic pressure was initially 144/84.  On  pullback, left ventricular pressure was 140/10.  Aortic pressure was  approximately 140/83.   ANGIOGRAPHIC DATA:  Left main coronary artery was a long vessel which  bifurcated to an LAD and left circumflex system.   The LAD had mild tortuosity.  There was 60% mid stenoses.   The circumflex vessel gave rise to a large OM-1 vessel which on the  initial angiogram had 80 percent smooth stenosis proximally.  The distal  aspect of the circumflex had diffuse 80-90% stenoses somewhat  eccentrically.  Following IC nitroglycerin administration, the OM-1  vessel almost normalized with residual narrowing of approximately 10%.  There was no significant change in the more distal 80-90% circumflex  stenosis.  There were left-to-right collaterals supplying the distal  RCA.   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  There was 90% proximal stenosis in a very small-  caliber vessel.  A small marginal branch proximally had diffuse  narrowing of 80%.  The RCA was totally occluded in its mid segment.   Following successful coronary intervention to the distal circumflex  vessel with PTCA utilizing a 2.0 x 20-mm, Maverick balloon with stenting  with a 2.25 x 24 Taxus Adam stent postdilated to 2.4 proximally and 2.3  distally, the entire segment was reduced to 0%.  There was brisk TIMI-3  flow.  There was no evidence for dissection.   Single-plane RAO ventriculography did show mid to basal  hypocontractility with an ejection fraction of approximately 50%.   IMPRESSION:  1. Low normal global left ventricular function with mild posterobasal      hypocontractility.  2. 60% narrowing in the mid left anterior descending.   3. 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  4. Successful percutaneous coronary intervention/stenting of the      distal circumflex vessel with ultimate insertion of a 2.25 x 24-mm      Taxus Adam  drug-eluting stent done with bivalirudin/Plavix and      intracoronary nitroglycerin.           ______________________________  Tracey Manning, M.D.     TK/MEDQ  D:  10/31/2007  T:  10/31/2007  Job:  [id number]

## 2011-04-07 NOTE — Discharge Summary (Signed)
Tracey Manning, Tracey Manning             ACCOUNT NO.:  [id number]   MEDICAL RECORD NO.:  [id number]          PATIENT TYPE:  INP   LOCATION:  2033                         FACILITY:  [facility]   PHYSICIAN:  Nicki Guadalajara, M.D.     DATE OF BIRTH:  08/05/1961   DATE OF ADMISSION:  10/28/2007  DATE OF DISCHARGE:  11/02/2007                               DISCHARGE SUMMARY   DISCHARGE DIAGNOSES:  1. Unstable angina with negative CK-MBs, positive troponin.  2. Coronary artery disease with a 90% stenosis of the right coronary      artery, 80% of the circumflex, undergoing a percutaneous      transluminal coronary angiography and stent deployment with a Taxus      drug-eluting stent to the circumflex artery      a.     Unable to cross the 90% lesion at this time.  Treat       medically.      b.     Residual disease of the left anterior descending coronary       artery as well.  3. Coronary spasm on cardiac catheterization.  4. Hypertension.  5. Dyslipidemia.  6. History of asthma.  7. Sinus bradycardia on higher doses of beta blocker.   CONDITION ON DISCHARGE:  Improved.   PROCEDURE:  Combined left heart catheterization with stent deployment to  the circumflex on October 31, 2007, by Dr. Nicki Guadalajara, with a drug-  eluting Taxus stent placed.  We will need to medically treat the RCA  lesion.   DISCHARGE MEDICATIONS:  1. Imdur 30 mg daily.  2. Simvastatin 40 mg daily.  3. Aspirin 325 mg daily.  4. Nitroglycerin sublingual p.r.n. chest pain.  5. Lisinopril 5 mg daily.  6. Plavix 75 mg daily.  Do not stop for one year at least.  If she      stops, it could cause a heart attack.  7. Ranexa 500 mg, one twice daily.  8. Toprol XL 25 mg, 1/2 tab daily instead of the metoprolol that she      was on.  9. Stop Nicoderm patch.  10.Stop metoprolol.   DISCHARGE INSTRUCTIONS:  1. A low-sodium, heart-healthy diet.  2. Return to work at [employer].  Dr. Tresa Endo will decide.  I did fill out      an illness  leave paper for the patient.  I put possibly return to      work on November 14, 2007, but it will depend upon Dr. Landry Dyke      judgment.  3. She will follow up with Dr. Tresa Endo on November 08, 2007, at 11:30      a.m.  4. Wash right groin catheterization site with soap and water.  5. Call if any bleeding, swelling or drainage.  6. May shower and bathe.  7. Increase activity slowly.  8. No lifting for two days.  9. No driving for two days.   HISTORY OF PRESENT ILLNESS:  This 50 year old African/American female  who underwent a cardiac catheterization on October 10, 2007, was found  to have coronary artery disease of the RCA, as  well as the circumflex,  which were the most significant.  The recommendation was to continue  medical therapy at that time.  She has now failed medical therapy, being  readmitted on October 28, 2007, with chest pain.  The pain started on  October 27, 2007, at around 11 a.m.  She had pain throughout the day.  Could not sleep well and presented to the emergency room on October 28, 2007.  She was given nitroglycerin with partial relief.  She described  the pain a weight in the middle of her chest, radiating to her left neck  and bilateral shoulders.  She complained of shortness of breath.   She was admitted to the CCU and placed on heparin and nitroglycerin.  She had previously been on a  Nicotine patch, and that was decreased to 7 mg per 24 hours.   PAST MEDICAL HISTORY:  1. Coronary artery disease, as stated.  2. Hypertension.  3. Dyslipidemia.  4. Asthma.   FAMILY HISTORY/SOCIAL HISTORY/REVIEW OF SYSTEMS:  See the H&P.   ALLERGIES:  No known drug allergies.   DISCHARGE PHYSICAL EXAMINATION:  VITAL SIGNS:  Blood pressure down to  94/53, pulse 60, respirations 20, temperature 98.4 degrees. Oxygen  saturation on room air 97%.  HEART:  A regular rate and rhythm.  LUNGS:  Clear.  ABDOMEN:  Soft.  EXTREMITIES:  Catheterization site in the groin was  stable.   LABORATORY DATA:  Admitting labs revealed a hemoglobin of 12.2,  hematocrit 35.5, WBCs 7.4, platelets 311.  At discharge hemoglobin 12,  hematocrit 34.6, WBCs 7.4, platelets 256, MCV 84.9.  Chemistry:  On  admission sodium 137, potassium 3.1, chloride 105, CO2 of 26, BUN 15,  creatinine 1.1, glucose 138.  She was given a potassium supplement.  On  one other day it also dropped to 3.3, again with supplementation.  At  discharge sodium 137, potassium 4, chloride 103, CO2 of 29, BUN 13,  creatinine 1.17, glucose 87.  Actually this was done on October 30, 2007.  Coags were normal on admission.  Liver function tests were  normal.  AST 17, ALT 14, alkaline phosphatase 52, total bilirubin 0.7,  albumin 3.2.  Cardiac markers initially CK 79, 77, 50, 43 and 50, with  negative MBs of 4.3, 3.4, 1.4 and 1.7.  Troponin I on admission was  0.20.  It remained there and then dropped to 0.13, 0.17 and 0.16.  Calcium normal.  Urinalysis was negative.   Chest x-ray:  Cardiomegaly, mild bronchitic changes.   Electrocardiograms on admission:  Sinus bradycardia, ST abnormality.  Followup on October 29, 2007, sinus bradycardia, sinus arrhythmia, but  no acute changes from the previous tracing.  Her last electrocardiogram  on November 01, 2007:  Sinus bradycardia with a rate of 44, GGC was 429  msec.  No acute changes.   HOSPITAL COURSE:  The patient was admitted by Dr. Tresa Endo and placed on IV  heparin and IV nitroglycerin and taken to the CCU.  Cardiac CK-MBs were  negative.  Troponin was somewhat elevated, most likely due to unstable  angina, but it did have a strange pattern.  She was also hypokalemic and  was replaced for potassium.  She underwent a cardiac catheterization and  a stent deployment to the circumflex on October 31, 2007.  They were  unable to do the procedure in the RCA.  The ejection fraction was 50%.  The patient had been started on Ranexa and she continued to improve.  By   October 31, 2007, though she had heart rates down to 39.  Her blood  pressure was stable.  The beta blocker was held for 24 hours and the  heart rate occasionally would drop to 44, but mostly the heart rate was  in the 50's and 60's.  The blood pressure was slightly low at discharge  at 94/53.  That will be rechecked, but the patient was ambulating in the  hall without any side effects.   DISPOSITION:  The patient was seen in discharge by Dr. Nanetta Batty.  She will follow up with Dr. Tresa Endo as an outpatient on November 08, 2007,  and decisions made concerning return to work at that time.      Darcella Gasman. Annie Paras, N.P.    ______________________________  Nicki Guadalajara, M.D.    LRI/MEDQ  D:  11/02/2007  T:  11/02/2007  Job:  [id number]   cc:   Nicki Guadalajara, M.D.  [facility]

## 2011-04-07 NOTE — Cardiovascular Report (Signed)
Tracey Manning             ACCOUNT NO.:  [id number]   MEDICAL RECORD NO.:  [id number]          PATIENT TYPE:  INP   LOCATION:  3735                         FACILITY:  [facility]   PHYSICIAN:  Nicki Guadalajara, M.D.     DATE OF BIRTH:  June 19, 1961   DATE OF PROCEDURE:  DATE OF DISCHARGE:                            CARDIAC CATHETERIZATION   INDICATIONS:  [name] is a 50 year old African American  female who apparently has remote cardiac history.  In 2004, she  underwent apparent stenting of one her vessels.  She has no idea as to  which vessel was involved.  She has a history of hypertension as well as  continued tobacco use, currently smoking a pack of cigarettes per day.  She was admitted to [facility] last evening with chest pain  worrisome for unstable angina.  Cardiac catheterization was recommended.   PROCEDURE:  After premedication with Versed 2 mg intravenously, the  patient prepped and draped in usual fashion.  The right femoral artery  was punctured anteriorly and a 5-French sheath was inserted.  Diagnostic  cardiac catheterization was done utilizing 5-French Judkins for a left  coronary catheter as well as initially a 5-French right catheter.  The  right catheter was unsuccessful at selectively engaging the RCA and  ultimately was changed to a no torque which also was unsuccessful.  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  The pigtail catheter was used for biplane selective  ventriculography.  Distal aortography was also performed with the  patient's hypertensive history to make certain she did not have any  renovascular etiology to her hypertension.  Hemostasis was obtained by  direct manual pressure.   HEMODYNAMIC DATA:  Central aortic pressure was 159/91.  Left ventricle  pressure was 160/17 post A-wave 26.   ANGIOGRAPHIC DATA:  Left  main coronary was a long vessel which  bifurcated into an LAD and left circumflex system.   The LAD gave rise to a very small first diagonal vessel.  Following this  first diagonal vessel was a first septal perforating artery that was 70%  proximal stenosis.  The mid LAD before a sharp bend in the vessel had  narrowing of 60-70%.  This was after the second diagonal and septal  vessel.   The circumflex vessel was tortuous.  There was 20% narrowing in the  midportion after the first marginal branch.  In a distal marginal  branch, where the vessel was small, there was diffuse 80% stenosis.  There was collateralization of the distal RCA seen both via circumflex  as well as septal perforator collaterals.   The right coronary artery seemed to arise superiorly.  Ultimately an AL-  1 catheter was necessary for selective engagement.  There was a 90%  proximal stenosis diffusely in a bend in the small vessel followed by  70% stenosis on the secondary bend.  There was 80% diffuse stenosis in a  very small anterior RV marginal branch.  The RCA was  totally occluded in  its midsegment after a mid anterior RV marginal branch.  Again  collaterals were seen left-to-right on the left coronary circulation.   Biplane selective arteriography revealed low normal to preserved global  contractility with an ejection fraction of 50 55%.  There was mild mid  to basal posterior hypocontractility in the RAO projection and subtle  low posterolateral hypocontractility on the LAO projection.   Distal aortography revealed a significantly tortuous abdominal aorta  without significant stenoses.  There was no evidence for renal artery  stenosis.   IMPRESSION:  1. Low normal/normal LV contractility with mild residual mid to basal      to low posterolateral hypocontractility.  2. 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   RECOMMENDATIONS:  Will try to obtain the angiographic data from the  patients [facility] hospitalization of 2004.  Presently, she has not been  on much in the way of anti-anginal medication.  She will be started on  beta blockade, nitrates, antiplatelet therapy with probable plans for  initial medical therapy trial.  The patient will also be started on  statin for aggressive lipid lowering, and will require smoking  cessation.  She will continue on her ACE inhibition.           ______________________________  Nicki Guadalajara, M.D.     TK/MEDQ  D:  10/10/2007  T:  10/10/2007  Job:  [id number]   cc:   [facility]

## 2011-04-07 NOTE — Discharge Summary (Signed)
Tracey Manning, RIBAUDO             ACCOUNT NO.:  [id number]   MEDICAL RECORD NO.:  [id number]          PATIENT TYPE:  INP   LOCATION:  3735                         FACILITY:  [facility]   PHYSICIAN:  Nicki Guadalajara, M.D.     DATE OF BIRTH:  1961/01/05   DATE OF ADMISSION:  10/09/2007  DATE OF DISCHARGE:  10/12/2007                               DISCHARGE SUMMARY   DISCHARGE DIAGNOSES:  1. Unstable angina.  2. Three-vessel moderate coronary artery disease at catheterization.  3. Preserved left ventricular function.  4. Hypertension.  5. History of smoking.  6. Dyslipidemia.  7. History of asthma.   HOSPITAL COURSE:  The patient is a 50 year old [place] American female  who is followed by [facility].  She works at [employer].  She has a  history of a prior PCI in 2004 in [place], [place].  She was on  clonidine and lisinopril prior to admission as well as Ventolin  inhalers.  She presented October 09, 2007, with chest pain consistent  with unstable angina.  She was admitted and put on IV heparin and  nitrates.  Her CK-MB and troponins initially were negative.  Catheterization was done October 10, 2007.  This revealed a 90%  proximal RCA, 70% mid RCA and total RCA mid vessel.  There were left-to-  right collaterals.  She had a 70% septal perforator and 60-70% LAD and  an 80% distal circumflex lesion.  Her EF was 50-55%.  Her renal arteries  were normal and iliacs were normal.  Plan is for medical therapy.  Postprocedure, her troponins went to 0.15.  Her CK and MBs were  negative.  We did have smoking cessation counselor see her.  We feel she  can be discharged October 12, 2007.   DISCHARGE MEDICATIONS:  1. Imdur 30 mg a day.  2. Metoprolol 25 mg twice a day.  3. Simvastatin 40 mg a day.  4. Coated aspirin 325 mg a day.  5. Nicoderm patch 14 mg a day.  6. Nitroglycerin sublingual p.r.n.  7. Lisinopril 5 mg a day.   LABORATORY DATA:  White count 8.6, hemoglobin 11.4, hematocrit  32.7,  platelets 216.  Sodium 135, potassium 3.6, BUN 8, creatinine 0.8.  LFTs  were normal.  CK on September 09, 2007, was 87 with 2.4 MB and troponin of  0.15.  Alcohol level was less 5 on admission.  TSH is 3.37.   Chest X-Ray:  Mild cardiomegaly and chronic bronchitic changes.   EKG shows sinus rhythm, sinus bradycardia, poor anterior R-wave  progression.   DISPOSITION:  The patient is discharged in stable condition and will  follow-up with Dr. Tresa Endo.  She will see him next week.  She has been  instructed stay out of work until she sees Dr. Tresa Endo.      Abelino Derrick, P.A.    ______________________________  Nicki Guadalajara, M.D.    Lenard Lance  D:  10/12/2007  T:  10/12/2007  Job:  [id number]   cc:   Melvern Banker

## 2011-06-11 ENCOUNTER — Emergency Department (HOSPITAL_COMMUNITY): Payer: Self-pay

## 2011-06-11 ENCOUNTER — Inpatient Hospital Stay (HOSPITAL_COMMUNITY)
Admission: EM | Admit: 2011-06-11 | Discharge: 2011-06-12 | DRG: 247 | Disposition: A | Discharge status: Home / Self Care | Payer: Self-pay | Attending: Cardiovascular Disease | Admitting: Cardiovascular Disease

## 2011-06-11 DIAGNOSIS — I2510 Atherosclerotic heart disease of native coronary artery without angina pectoris: Principal | ICD-10-CM | POA: Diagnosis present

## 2011-06-11 DIAGNOSIS — R109 Unspecified abdominal pain: Secondary | ICD-10-CM | POA: Diagnosis present

## 2011-06-11 DIAGNOSIS — E669 Obesity, unspecified: Secondary | ICD-10-CM | POA: Diagnosis present

## 2011-06-11 DIAGNOSIS — E876 Hypokalemia: Secondary | ICD-10-CM | POA: Diagnosis not present

## 2011-06-11 DIAGNOSIS — F17200 Nicotine dependence, unspecified, uncomplicated: Secondary | ICD-10-CM | POA: Diagnosis present

## 2011-06-11 DIAGNOSIS — Z7982 Long term (current) use of aspirin: Secondary | ICD-10-CM

## 2011-06-11 DIAGNOSIS — I200 Unstable angina: Secondary | ICD-10-CM | POA: Diagnosis present

## 2011-06-11 DIAGNOSIS — Z79899 Other long term (current) drug therapy: Secondary | ICD-10-CM

## 2011-06-11 DIAGNOSIS — Z8249 Family history of ischemic heart disease and other diseases of the circulatory system: Secondary | ICD-10-CM

## 2011-06-11 DIAGNOSIS — G8929 Other chronic pain: Secondary | ICD-10-CM | POA: Diagnosis present

## 2011-06-11 DIAGNOSIS — E785 Hyperlipidemia, unspecified: Secondary | ICD-10-CM | POA: Diagnosis present

## 2011-06-11 DIAGNOSIS — I2582 Chronic total occlusion of coronary artery: Secondary | ICD-10-CM | POA: Diagnosis present

## 2011-06-11 DIAGNOSIS — M549 Dorsalgia, unspecified: Secondary | ICD-10-CM | POA: Diagnosis present

## 2011-06-11 HISTORY — PX: CORONARY ANGIOPLASTY WITH STENT PLACEMENT: SHX49

## 2011-06-11 LAB — COMPREHENSIVE METABOLIC PANEL
AST: 12 U/L (ref 0–37)
Albumin: 3.4 g/dL — ABNORMAL LOW (ref 3.5–5.2)
Alkaline Phosphatase: 61 U/L (ref 39–117)
Chloride: 102 mEq/L (ref 96–112)
Potassium: 3.7 mEq/L (ref 3.5–5.1)
Sodium: 138 mEq/L (ref 135–145)
Total Bilirubin: 0.3 mg/dL (ref 0.3–1.2)
Total Protein: 6.9 g/dL (ref 6.0–8.3)

## 2011-06-11 LAB — URINALYSIS, ROUTINE W REFLEX MICROSCOPIC
Bilirubin Urine: NEGATIVE
Glucose, UA: NEGATIVE mg/dL
Ketones, ur: NEGATIVE mg/dL
Leukocytes, UA: NEGATIVE
Specific Gravity, Urine: 1.018 (ref 1.005–1.030)
pH: 6 (ref 5.0–8.0)

## 2011-06-11 LAB — URINE MICROSCOPIC-ADD ON

## 2011-06-11 LAB — POCT PREGNANCY, URINE: Preg Test, Ur: NEGATIVE

## 2011-06-11 LAB — HEMOGLOBIN A1C
Hgb A1c MFr Bld: 6 % — ABNORMAL HIGH (ref <5.7)
Mean Plasma Glucose: 126 mg/dL — ABNORMAL HIGH (ref <117)

## 2011-06-11 LAB — CBC
HCT: 37.6 % (ref 36.0–46.0)
Hemoglobin: 13.4 g/dL (ref 12.0–15.0)
MCV: 83.2 fL (ref 78.0–100.0)
RBC: 4.52 MIL/uL (ref 3.87–5.11)
WBC: 7.4 10*3/uL (ref 4.0–10.5)

## 2011-06-11 LAB — PROTIME-INR: INR: 1.01 (ref 0.00–1.49)

## 2011-06-11 LAB — DIFFERENTIAL
Eosinophils Relative: 1 % (ref 0–5)
Lymphocytes Relative: 18 % (ref 12–46)
Lymphs Abs: 1.3 10*3/uL (ref 0.7–4.0)
Neutro Abs: 5.6 10*3/uL (ref 1.7–7.7)
Neutrophils Relative %: 76 % (ref 43–77)

## 2011-06-11 LAB — CARDIAC PANEL(CRET KIN+CKTOT+MB+TROPI)
CK, MB: 2.2 ng/mL (ref 0.3–4.0)
Total CK: 62 U/L (ref 7–177)
Troponin I: <0.3 ng/mL (ref <0.30)

## 2011-06-11 LAB — APTT: aPTT: 32 seconds (ref 24–37)

## 2011-06-11 LAB — CK TOTAL AND CKMB (NOT AT ARMC): Relative Index: INVALID (ref 0.0–2.5)

## 2011-06-11 LAB — TROPONIN I: Troponin I: <0.3 ng/mL (ref <0.30)

## 2011-06-12 LAB — LIPID PANEL
LDL Cholesterol: 136 mg/dL — ABNORMAL HIGH (ref 0–99)
Total CHOL/HDL Ratio: 4.5 RATIO
Triglycerides: 143 mg/dL (ref <150)
VLDL: 29 mg/dL (ref 0–40)

## 2011-06-12 LAB — CBC
MCH: 29 pg (ref 26.0–34.0)
MCHC: 34.9 g/dL (ref 30.0–36.0)
Platelets: 182 10*3/uL (ref 150–400)

## 2011-06-12 LAB — BASIC METABOLIC PANEL
CO2: 26 mEq/L (ref 19–32)
Calcium: 8.2 mg/dL — ABNORMAL LOW (ref 8.4–10.5)
Chloride: 104 mEq/L (ref 96–112)
Creatinine, Ser: 0.86 mg/dL (ref 0.50–1.10)
Glucose, Bld: 105 mg/dL — ABNORMAL HIGH (ref 70–99)
Sodium: 138 mEq/L (ref 135–145)

## 2011-06-12 LAB — CARDIAC PANEL(CRET KIN+CKTOT+MB+TROPI): Total CK: 59 U/L (ref 7–177)

## 2011-06-12 LAB — URINE CULTURE: Culture  Setup Time: 201207191527

## 2011-06-15 NOTE — Cardiovascular Report (Signed)
[name], [name]             ACCOUNT NO.:  [id number]  MEDICAL RECORD NO.:  [id number]  LOCATION:  6526                         FACILITY:  [facility]  PHYSICIAN:  Nicki Guadalajara, M.D.     DATE OF BIRTH:  Feb 25, 1961  DATE OF PROCEDURE:  06/11/2011 DATE OF DISCHARGE:                           CARDIAC CATHETERIZATION   INDICATIONS:  [name] is a 50 year old African American female who has documented history of coronary artery disease.  She is status post prior stenting to her circumflex vessel with a 2.25 x 24-mm Taxus Atom stent in December 2008.  She has previously documented 50% LAD lesions and known RCA occlusion.  Recently, she has experienced increased episodes of recurrent chest pain over the past 10 days.  Over the past 3 days, she has noticed increasing symptomatology to the point where yesterday, she required 6 nitroglycerin off and on throughout the day.  Her chest pain recurred again today where she required additionalnitroglycerin and ultimately presented to the emergency room for further evaluation.  In the ER, she was noted to have new T-wave changes in leads II, [name] and V2 through V6.  She is now referred for cardiac catheterization and possible percutaneous coronary intervention.  PROCEDURE:  After premedication with Versed 2 mg plus fentanyl 50 mcg, the patient was prepped and draped in usual fashion.  Her right femoral artery was punctured anteriorly and a 5-French sheath was inserted without difficulty.  Diagnostic catheterization was done utilizing 5- [place] Judkins for left coronary catheters.  An Amplatz L2 catheter was necessary for angiography to the right coronary artery.  The pigtail catheter was used for left ventriculography.  With the patient's history of hypertension, distal aortography was performed to make certain she did not have any renovascular etiology.  With the demonstration of a new 95% proximal-to-mid LAD stenosis, a decision was  made to perform intervention.  Her right femoral sheath was upgraded to a 6-French system.  The patient received additional Versed plus fentanyl for conscious sedation.  Because she was significantly hypertensive, IV nitroglycerin was titrated during the procedure up to 50 mcg. Initially, an XB LAD 3 guide was necessary, but this did not provide suboptimal coaxial engagement and an FL 3.5 catheter was used for the intervention.  We did not have an XB LAD 3.5 guide to use for the procedure.  IC nitroglycerin was administered down the LAD without significant change in the 95% stenosis.  The patient had been on chronic Plavix.  She was switched to Effient and received 60 mg load orally. Angiomax was administered and ACT was documented to be therapeutic.  A 10 Medtronic intervention wire was advanced down the LAD system. Predilatation was done utilizing a 2.5 x 12-mm sprinter balloon.  A 3.0 x 15-mm DES Resolute stent was then inserted and dilated x2 up to 13 atmospheres.  A 3.25 x 12-mm noncompliant sprinter balloon was used for post-stent dilatation up to 3.28 mm.  [facility]out angiography confirmed an excellent angiographic result.  The stent was placed just distal to the proximal septal perforating artery to cover a 40% stenosis beyond the septal and extended to involve the 95% stenosis.  All lesions were reduced to 0%.  There was no change in the previously noted 30%-40% mid LAD stenosis.  The patient tolerated the procedure well.  The arterial sheath was sutured in place with plantar sheath removal later today.  HEMODYNAMIC DATA:  Central aortic pressure is 170/105, electrical pressure 170/24.  ANGIOGRAPHIC DATA:  Left main coronary artery was angiographically normal vessel which bifurcated into an LAD and left circumflex system.  The LAD had an upper takeoff initially with a proximal bend.  After the first prominent septal perforating artery, there was 40% narrowing. Shortly thereafter  was a 95% stenosis before a bend in the vessel into the mid LAD segment.  There also was 30%-40% mid LAD stenosis.  The LAD wrapped around the LV apex.  Distal collateralization to the RCA was evident through the left injection.  The circumflex vessel gave rise to a proximal marginal vessel that had smooth 30%-40% proximal narrowing.  The mid distal AV groove circumflex had a widely patent previously placed Taxus 2.25 x 24-mm stent which was widely patent.  The right coronary artery was seen to arise in the area of the left coronary cusp.  There was 95% proximal stenosis.  After small proximal branches, the RCA was totally occluded.  There was faint antegrade collateral, supplying the mid RCA.  There was prominent left-to-right collateral supplying the distal RCA.  Left ventriculography revealed mid-to-basal posterior hypocontractility. Ejection fraction was 50%.  Distal aortography revealed patent renal arteries without significant obstructive aortoiliac disease.  Following percutaneous coronary intervention with PTCA and stenting to the proximal-to-mid LAD, the region of 40% and 95% LAD stenoses were reduced to 0% with insertion of the 3.0 x 15-mm DES Resolute stent postdilated to 3.28 mm.  IMPRESSION: 1. Low normal left ventricular function with mild mid-to-basal     posterior hypocontractility. 2. Multivessel coronary artery disease with 40% narrowing in the left     anterior descending artery just after the takeoff of the septal     perforating artery with 95% stenosis focally in the left anterior     descending artery shortly thereafter just proximal to the bend in     the left anterior descending artery system with 30%-40% mid left     anterior descending artery stenosis. 3. Patent stent in the mid distal atrioventricular groove circumflex     with evidence for 30%-40% narrowing in the first obtuse marginal     vessel. 4. A 95% very proximal right coronary artery  stenosis with total     occlusion of the right coronary artery with both right-to-right and     left-to-right collaterals. 5. No significant aortoiliac disease. 6. Successful percutaneous transluminal coronary angioplasty stenting     of the left anterior descending artery with the segmental proximal-     to-mid left anterior descending artery stenoses being reduced to 0%     with insertion of a 3.0 x 15-mm drug-eluting Resolute stent     postdilated to 3.28 mm. 7. Intravenous and intracoronary nitroglycerin/intravenous Lopressor     2.5 mg, bivalirudin and 60 mg oral Effient administered during the     procedure.          ______________________________ Nicki Guadalajara, M.D.     TK/MEDQ  D:  06/11/2011  T:  06/12/2011  Job:  [id number]  Electronically Signed by Nicki Guadalajara M.D. on 06/15/2011 04:23:36 PM

## 2011-06-24 NOTE — Discharge Summary (Signed)
Tracey Manning, Tracey Manning             ACCOUNT NO.:  [id number]  MEDICAL RECORD NO.:  [id number]  LOCATION:  6526                         FACILITY:  [facility]  PHYSICIAN:  Nicki Guadalajara, M.D.     DATE OF BIRTH:  [birth year]  DATE OF ADMISSION:  06/11/2011 DATE OF DISCHARGE:  06/12/2011                              DISCHARGE SUMMARY   DISCHARGE DIAGNOSES: 1. Coronary artery disease status post stent to Tracey circumflex artery     in 2008, status post left heart catheterization this admission with     a drug-eluting stent placed in Tracey left anterior descending     coronary artery, 95% proximal right coronary artery stenosis     followed by total occlusion of Tracey right coronary artery with     collateral arteries. 2. Chest pain, unstable angina. 3. Hypertension, treated. 4. Tobacco abuse. 5. Obesity. 6. Hypokalemia, repleted.  HOSPITAL COURSE:  Tracey Manning is a 50 year old obese African American female who continues to smoke.  She recently moved to [place], [place] approximately November of last year, and has been back within Tracey last month and a half.  She apparently had been seen Dr. Lawerance Cruel, cardiologist, in [place].  She has a history of 50-60% lesion in Tracey LAD, 90% stenosis in Tracey circumflex, status post stent to that artery in 2008, and a total nondominant RCA with left-to-right collaterals.  Her last catheterization was in 2010, which showed total RCA, nonocclusive disease elsewhere.  She had a normal nuclear stress test in 2010 as well.  Her history also includes chronic right upper quadrant pain and bradycardia.  She reported that chest pain as a "fluttering" in her chest as well as tightness and burning sensation.  She responded quickly to nitroglycerin which she had taken 6 Tracey day before admission.  She was also noted new T-wave inversions in V2 and V6 on EKG.  She was started on IV nitroglycerin and heparin.  She is scheduled for left heart catheterization Tracey day she was  presented to Tracey emergency room. Cardiac enzymes were cycled x8, negative each time.  Tobacco cessation was also initiated.  Her A1c, lipids, and TSH were checked, results below.  Left heart catheterization showed 90% LAD stenosis which was stented with a drug-eluting stent and also 30-40% mid LAD stenosis. Right coronary artery was occluded after a 90% proximal stenosis with left-to-right collaterals.  Currently, Tracey Manning is stable and has been seen by Dr. Allyson Sabal who feels she is ready for discharge home. Cardiac rehab was initiated.  Tobacco cessation counseling was completed on June 12, 2011.  Tracey Manning is interested in quitting very much.  Of note, Tracey Manning was crying during Tracey session and indicated she was feeling depressed.  Tracey Manning was referred to social work for assistance.  Tracey Manning was also referred to Tracey Quit Now Line for assistance.  DISCHARGE LABS:  WBC is 8.7, hemoglobin 12.0, hematocrit 34.4, platelets 182.  Sodium 138, potassium 3.4, chloride 104, carbon dioxide 26, BUN 10, creatinine 0.86, glucose 105, calcium 8.2.  Hemoglobin A1c 6.0. Cardiac enzymes negative x3.  Total cholesterol 212, triglycerides 143, HDL 47, LDL 136, VLDL 29.  Total cholesterol/HDL ratio 4.5.  Thyroid stimulating  hormone of 1.329.  Urine pregnancy test was negative.  A urinalysis showed small blood, protein of 30, few squamous epithelials, rare bacteria.  Total bilirubin 0.3, alkaline phosphatase 61, AST 12, ALT 15, total protein 6.9, albumin 3.4.  STUDIES/PROCEDURES: 1. Chest x-ray shows no acute cardiopulmonary findings. 2. Cardiac catheterization, June 11, 2011.  Impression; low normal     left ventricular function with mild mid-to-basal posterior     hypocontractility.  Multivessel coronary artery disease with 40%     narrowing in Tracey left anterior descending artery just after Tracey     takeoff Tracey septal perforating artery with 95% stenosis focally in     Tracey left anterior  descending artery, shortly thereafter just     proximal to Tracey bend in Tracey left anterior descending artery system     with 30-40% mid left anterior descending artery stenosis.  It was a     patent stent in Tracey mid distal atrioventricular groove circumflex     with evidence of 30-40% narrowing in Tracey first obtuse marginal     vessel.  A 95% very proximal right coronary artery stenosis with     total occlusion of Tracey right coronary artery with both right-to-     right and left-to-right collaterals.  No significant aortoiliac     disease.  Successful percutaneous transluminal coronary angioplasty     stenting to Tracey left anterior descending artery with segmental     proximal mid left anterior descending artery stenosis has been     reduced to 0% with a drug-eluting Resolute stent.  DISCHARGE MEDICATIONS: 1. Acetaminophen 325 mg 2 tablets by mouth every 4 hours as needed for     pain. 2. Amlodipine 5 mg 1 tablet by mouth daily. 3. Metoprolol 12.5 mg 1/2 tablet by mouth every 12 hours. 4. Prasugrel 10 mg 1 tablet by mouth daily. 5. Aspirin 325 mg 1 tablet by mouth daily. 6. Lipitor 40 mg 1 tablet by mouth daily. 7. Lisinopril/HCTZ 20/25 mg 1 tablet by mouth daily. 8. Nitroglycerin sublingual 0.4 mg 1 tablet under Tracey tongue every 5     minutes up to 3 doses total as needed for chest pain.  DISPOSITION:  Tracey Manning will be discharged home in stable condition. It was recommended she increase her activity slowly.  She may shower and bathe.  No lifting or driving for 3 days.  It was recommended she eats a heart-healthy, low-carbohydrate diet.  If her catheter site becomes red, painful, swollen, or discharges fluid or pus, she is to call our office. She will follow up with Dr. Tresa Endo on Tuesday, June 30, 2011, at 9:15 a.m., and she will follow up [facility] on June 17, 2011, at 8:45 a.m. Tracey Manning was also provided with Effient assistance form and 30-day prescription for  assistance.    ______________________________ Wilburt Finlay, PA   ______________________________ Nicki Guadalajara, M.D.    BH/MEDQ  D:  06/12/2011  T:  06/12/2011  Job:  [id number]  Electronically Signed by Wilburt Finlay PA on 06/22/2011 02:11:31 PM Electronically Signed by Nicki Guadalajara M.D. on 06/24/2011 04:38:05 PM

## 2011-08-20 LAB — CK TOTAL AND CKMB (NOT AT ARMC)
CK, MB: 1.5
CK, MB: 1.8
Relative Index: 1.4
Total CK: 119

## 2011-08-20 LAB — CBC
HCT: 36.7
HCT: 39.8
Hemoglobin: 12.4
Hemoglobin: 13.7
MCHC: 34.5
MCHC: 34.8
MCV: 84.9
MCV: 85.1
MCV: 86.2
Platelets: 223
Platelets: 224
Platelets: 245
RBC: 4.18
RBC: 4.18
RDW: 15.6 — ABNORMAL HIGH
RDW: 16 — ABNORMAL HIGH
WBC: 8.1

## 2011-08-20 LAB — URINALYSIS, ROUTINE W REFLEX MICROSCOPIC
Bilirubin Urine: NEGATIVE
Bilirubin Urine: NEGATIVE
Glucose, UA: NEGATIVE
Hgb urine dipstick: NEGATIVE
Ketones, ur: NEGATIVE
Nitrite: NEGATIVE
Protein, ur: NEGATIVE
Protein, ur: NEGATIVE
Specific Gravity, Urine: 1.02
Urobilinogen, UA: 0.2
Urobilinogen, UA: 1

## 2011-08-20 LAB — BASIC METABOLIC PANEL
BUN: 12
GFR calc non Af Amer: >60
Glucose, Bld: 104 — ABNORMAL HIGH
Potassium: 3.8

## 2011-08-20 LAB — COMPREHENSIVE METABOLIC PANEL
Albumin: 4.2
Alkaline Phosphatase: 63
BUN: 12
CO2: 26
Chloride: 103
Creatinine, Ser: 0.91
GFR calc non Af Amer: >60
Glucose, Bld: 89
Potassium: 3.8
Total Bilirubin: 0.9

## 2011-08-20 LAB — HEMOGLOBIN A1C: Hgb A1c MFr Bld: 5.9

## 2011-08-20 LAB — TROPONIN I: Troponin I: 0.02

## 2011-08-20 LAB — POCT CARDIAC MARKERS
CKMB, poc: 1.3
CKMB, poc: <1 — ABNORMAL LOW
Myoglobin, poc: 73.3
Myoglobin, poc: 88.9
Operator id: 196461
Troponin i, poc: <0.05

## 2011-08-20 LAB — D-DIMER, QUANTITATIVE: D-Dimer, Quant: 0.24

## 2011-08-20 LAB — DIFFERENTIAL
Basophils Absolute: 0
Basophils Relative: 0
Lymphocytes Relative: 20
Neutro Abs: 5.8
Neutrophils Relative %: 72

## 2011-08-20 LAB — LIPID PANEL
HDL: 44
Total CHOL/HDL Ratio: 2.6
VLDL: 9

## 2011-08-20 LAB — TSH: TSH: 0.841 (ref 0.350–4.500)

## 2011-08-20 LAB — MAGNESIUM: Magnesium: 2.1

## 2011-08-20 LAB — POCT PREGNANCY, URINE: Preg Test, Ur: NEGATIVE

## 2011-08-20 LAB — PROTIME-INR: Prothrombin Time: 14.8

## 2011-08-20 LAB — HEPARIN LEVEL (UNFRACTIONATED): Heparin Unfractionated: 0.9 — ABNORMAL HIGH

## 2011-08-31 LAB — CBC
HCT: 34.1 — ABNORMAL LOW
HCT: 36
HCT: 36.4
Hemoglobin: 11.9 — ABNORMAL LOW
Hemoglobin: 12.2
Hemoglobin: 12.4
Hemoglobin: 12.6
MCHC: 34.4
MCHC: 34.5
MCHC: 34.6
MCHC: 34.6
MCV: 85.5
MCV: 85.7
RBC: 4.15
RBC: 4.2
RBC: 4.26
RDW: 15
RDW: 15.3
RDW: 15.4
RDW: 15.4
RDW: 15.7 — ABNORMAL HIGH

## 2011-08-31 LAB — POCT CARDIAC MARKERS
Myoglobin, poc: 91.9
Operator id: 198171
Troponin i, poc: <0.05

## 2011-08-31 LAB — HEPARIN LEVEL (UNFRACTIONATED): Heparin Unfractionated: 0.96 — ABNORMAL HIGH

## 2011-08-31 LAB — CARDIAC PANEL(CRET KIN+CKTOT+MB+TROPI)
Relative Index: INVALID
Relative Index: INVALID
Relative Index: INVALID
Total CK: 43
Total CK: 50
Total CK: 77
Troponin I: 0.16 — ABNORMAL HIGH
Troponin I: 0.2 — ABNORMAL HIGH

## 2011-08-31 LAB — BASIC METABOLIC PANEL
CO2: 27
CO2: 28
CO2: 29
Calcium: 8.6
Calcium: 8.7
Calcium: 9.1
Chloride: 103
Chloride: 105
Creatinine, Ser: 1.01
Creatinine, Ser: 1.17
GFR calc Af Amer: >60
GFR calc Af Amer: >60
GFR calc non Af Amer: 51 — ABNORMAL LOW
GFR calc non Af Amer: 56 — ABNORMAL LOW
GFR calc non Af Amer: 59 — ABNORMAL LOW
Glucose, Bld: 87
Glucose, Bld: 95
Glucose, Bld: 95
Potassium: 3.3 — ABNORMAL LOW
Potassium: 3.5
Sodium: 136
Sodium: 137
Sodium: 138
Sodium: 139

## 2011-08-31 LAB — COMPREHENSIVE METABOLIC PANEL
AST: 17
Alkaline Phosphatase: 52
BUN: 15
CO2: 26
Chloride: 105
Creatinine, Ser: 1.1
GFR calc non Af Amer: 53 — ABNORMAL LOW
Potassium: 3.1 — ABNORMAL LOW
Total Bilirubin: 0.7

## 2011-08-31 LAB — URINALYSIS, ROUTINE W REFLEX MICROSCOPIC
Bilirubin Urine: NEGATIVE
Glucose, UA: NEGATIVE
Hgb urine dipstick: NEGATIVE
Ketones, ur: NEGATIVE
Specific Gravity, Urine: 1.009
pH: 6

## 2011-08-31 LAB — I-STAT 8, (EC8 V) (CONVERTED LAB)
BUN: 19
Chloride: 104
Glucose, Bld: 100 — ABNORMAL HIGH
pCO2, Ven: 43.3 — ABNORMAL LOW
pH, Ven: 7.398 — ABNORMAL HIGH

## 2011-08-31 LAB — PROTIME-INR
INR: 1.1
Prothrombin Time: 14.1
Prothrombin Time: 14.4

## 2011-09-01 LAB — CBC
HCT: 32.7 — ABNORMAL LOW
HCT: 38.2
Hemoglobin: 11.4 — ABNORMAL LOW
Hemoglobin: 11.5 — ABNORMAL LOW
Hemoglobin: 13
MCHC: 34.8
MCHC: 34.9
MCV: 84.1
RBC: 3.89
RBC: 3.91
RBC: 4.21
RBC: 4.47
RDW: 14.9
RDW: 15.1
WBC: 8.6
WBC: 9.4

## 2011-09-01 LAB — COMPREHENSIVE METABOLIC PANEL
ALT: 16
AST: 19
Albumin: 3.7
Alkaline Phosphatase: 58
Chloride: 101
GFR calc Af Amer: >60
Potassium: 3.1 — ABNORMAL LOW
Sodium: 136
Total Bilirubin: 0.7
Total Protein: 6.9

## 2011-09-01 LAB — POCT CARDIAC MARKERS
CKMB, poc: 1.2
Myoglobin, poc: 87.1
Operator id: 257131
Operator id: 272551
Troponin i, poc: <0.05
Troponin i, poc: <0.05
Troponin i, poc: <0.05

## 2011-09-01 LAB — I-STAT 8, (EC8 V) (CONVERTED LAB)
Acid-Base Excess: 1
BUN: 19
Bicarbonate: 27.4 — ABNORMAL HIGH
Chloride: 102
Glucose, Bld: 91
HCT: 43
Hemoglobin: 14.6
Operator id: 257131
Potassium: 3.2 — ABNORMAL LOW
Sodium: 136
TCO2: 29
pCO2, Ven: 49.3
pH, Ven: 7.353 — ABNORMAL HIGH

## 2011-09-01 LAB — DIFFERENTIAL
Basophils Absolute: 0.1
Basophils Relative: 1
Eosinophils Relative: 2
Monocytes Absolute: 0.5
Monocytes Relative: 5

## 2011-09-01 LAB — CARDIAC PANEL(CRET KIN+CKTOT+MB+TROPI)
CK, MB: 2.3
CK, MB: 2.4
Relative Index: INVALID
Total CK: 77
Total CK: 95

## 2011-09-01 LAB — CK TOTAL AND CKMB (NOT AT ARMC): Relative Index: 2.1

## 2011-09-01 LAB — ETHANOL: Alcohol, Ethyl (B): <5

## 2011-09-01 LAB — BASIC METABOLIC PANEL
Calcium: 8.3 — ABNORMAL LOW
GFR calc Af Amer: >60
GFR calc non Af Amer: >60
Sodium: 135

## 2011-09-01 LAB — POCT I-STAT CREATININE
Creatinine, Ser: 1.2
Operator id: 257131

## 2011-09-01 LAB — LIPID PANEL
Cholesterol: 236 — ABNORMAL HIGH
Total CHOL/HDL Ratio: 4.2

## 2011-09-01 LAB — HEPARIN LEVEL (UNFRACTIONATED): Heparin Unfractionated: <0.1 — ABNORMAL LOW

## 2011-09-01 LAB — TROPONIN I: Troponin I: 0.12 — ABNORMAL HIGH

## 2013-08-07 ENCOUNTER — Emergency Department (HOSPITAL_COMMUNITY): Payer: Medicaid Other

## 2013-08-07 ENCOUNTER — Encounter (HOSPITAL_COMMUNITY): Payer: Self-pay | Admitting: *Deleted

## 2013-08-07 ENCOUNTER — Emergency Department (HOSPITAL_COMMUNITY)
Admission: EM | Admit: 2013-08-07 | Discharge: 2013-08-07 | Disposition: A | Discharge status: Home / Self Care | Payer: Medicaid Other | Attending: Emergency Medicine | Admitting: Emergency Medicine

## 2013-08-07 DIAGNOSIS — G8929 Other chronic pain: Secondary | ICD-10-CM | POA: Insufficient documentation

## 2013-08-07 DIAGNOSIS — M549 Dorsalgia, unspecified: Secondary | ICD-10-CM | POA: Insufficient documentation

## 2013-08-07 DIAGNOSIS — N760 Acute vaginitis: Secondary | ICD-10-CM | POA: Insufficient documentation

## 2013-08-07 DIAGNOSIS — R109 Unspecified abdominal pain: Secondary | ICD-10-CM | POA: Insufficient documentation

## 2013-08-07 DIAGNOSIS — Z7902 Long term (current) use of antithrombotics/antiplatelets: Secondary | ICD-10-CM | POA: Insufficient documentation

## 2013-08-07 DIAGNOSIS — I10 Essential (primary) hypertension: Secondary | ICD-10-CM | POA: Insufficient documentation

## 2013-08-07 DIAGNOSIS — R51 Headache: Secondary | ICD-10-CM | POA: Insufficient documentation

## 2013-08-07 DIAGNOSIS — Z79899 Other long term (current) drug therapy: Secondary | ICD-10-CM | POA: Insufficient documentation

## 2013-08-07 DIAGNOSIS — F17200 Nicotine dependence, unspecified, uncomplicated: Secondary | ICD-10-CM | POA: Insufficient documentation

## 2013-08-07 DIAGNOSIS — I2510 Atherosclerotic heart disease of native coronary artery without angina pectoris: Secondary | ICD-10-CM | POA: Insufficient documentation

## 2013-08-07 DIAGNOSIS — I252 Old myocardial infarction: Secondary | ICD-10-CM | POA: Insufficient documentation

## 2013-08-07 DIAGNOSIS — Z3202 Encounter for pregnancy test, result negative: Secondary | ICD-10-CM | POA: Insufficient documentation

## 2013-08-07 DIAGNOSIS — M5430 Sciatica, unspecified side: Secondary | ICD-10-CM | POA: Insufficient documentation

## 2013-08-07 DIAGNOSIS — J45901 Unspecified asthma with (acute) exacerbation: Secondary | ICD-10-CM | POA: Insufficient documentation

## 2013-08-07 DIAGNOSIS — B9689 Other specified bacterial agents as the cause of diseases classified elsewhere: Secondary | ICD-10-CM | POA: Insufficient documentation

## 2013-08-07 DIAGNOSIS — A499 Bacterial infection, unspecified: Secondary | ICD-10-CM | POA: Insufficient documentation

## 2013-08-07 HISTORY — DX: Sciatica, unspecified side: M54.30

## 2013-08-07 HISTORY — DX: Unspecified asthma, uncomplicated: J45.909

## 2013-08-07 HISTORY — DX: Atherosclerotic heart disease of native coronary artery without angina pectoris: I25.10

## 2013-08-07 HISTORY — DX: Essential (primary) hypertension: I10

## 2013-08-07 HISTORY — DX: Acute myocardial infarction, unspecified: I21.9

## 2013-08-07 LAB — POCT PREGNANCY, URINE: Preg Test, Ur: NEGATIVE

## 2013-08-07 LAB — URINALYSIS, ROUTINE W REFLEX MICROSCOPIC
Hgb urine dipstick: NEGATIVE
Protein, ur: NEGATIVE mg/dL
Urobilinogen, UA: 0.2 mg/dL (ref 0.0–1.0)

## 2013-08-07 LAB — CBC
MCH: 30.4 pg (ref 26.0–34.0)
MCV: 84.5 fL (ref 78.0–100.0)
Platelets: 242 10*3/uL (ref 150–400)
RBC: 4.9 MIL/uL (ref 3.87–5.11)
RDW: 15.6 % — ABNORMAL HIGH (ref 11.5–15.5)

## 2013-08-07 LAB — PRO B NATRIURETIC PEPTIDE: Pro B Natriuretic peptide (BNP): 224.9 pg/mL — ABNORMAL HIGH (ref 0–125)

## 2013-08-07 LAB — BASIC METABOLIC PANEL
CO2: 28 mEq/L (ref 19–32)
Calcium: 9.6 mg/dL (ref 8.4–10.5)
Creatinine, Ser: 0.94 mg/dL (ref 0.50–1.10)

## 2013-08-07 LAB — WET PREP, GENITAL: Yeast Wet Prep HPF POC: NONE SEEN

## 2013-08-07 MED ORDER — METOCLOPRAMIDE HCL 5 MG/ML IJ SOLN
10.0000 mg | Freq: Once | INTRAMUSCULAR | Status: AC
  Administered 2013-08-07: 10 mg via INTRAVENOUS
  Filled 2013-08-07: qty 2

## 2013-08-07 MED ORDER — HYDROCODONE-ACETAMINOPHEN 5-325 MG PO TABS
1.0000 | ORAL_TABLET | Freq: Once | ORAL | Status: AC
  Administered 2013-08-07: 1 via ORAL
  Filled 2013-08-07: qty 1

## 2013-08-07 MED ORDER — METRONIDAZOLE 500 MG PO TABS: 500.0000 mg | ORAL_TABLET | Freq: Three times a day (TID) | ORAL | Status: DC

## 2013-08-07 MED ORDER — HYDROMORPHONE HCL PF 1 MG/ML IJ SOLN
1.0000 mg | Freq: Once | INTRAMUSCULAR | Status: DC
  Filled 2013-08-07: qty 1

## 2013-08-07 MED ORDER — ONDANSETRON HCL 4 MG/2ML IJ SOLN
4.0000 mg | Freq: Once | INTRAMUSCULAR | Status: DC
  Filled 2013-08-07: qty 2

## 2013-08-07 MED ORDER — IOHEXOL 300 MG/ML  SOLN
100.0000 mL | Freq: Once | INTRAMUSCULAR | Status: AC | PRN
  Administered 2013-08-07: 100 mL via INTRAVENOUS

## 2013-08-07 MED ORDER — IOHEXOL 300 MG/ML  SOLN
25.0000 mL | INTRAMUSCULAR | Status: AC
  Administered 2013-08-07: 25 mL via ORAL

## 2013-08-07 MED ORDER — DEXAMETHASONE SODIUM PHOSPHATE 10 MG/ML IJ SOLN
10.0000 mg | Freq: Once | INTRAMUSCULAR | Status: AC
  Administered 2013-08-07: 10 mg via INTRAVENOUS
  Filled 2013-08-07: qty 1

## 2013-08-07 MED ORDER — DIPHENHYDRAMINE HCL 50 MG/ML IJ SOLN
25.0000 mg | Freq: Once | INTRAMUSCULAR | Status: AC
  Administered 2013-08-07: 25 mg via INTRAVENOUS
  Filled 2013-08-07: qty 1

## 2013-08-07 NOTE — ED Notes (Signed)
Pt comfortable with d/c and f/u instructions. Prescriptions x1 

## 2013-08-07 NOTE — ED Provider Notes (Signed)
CSN: [id number]     Arrival date & time 08/07/13  1242 History   First MD Initiated Contact with Patient 08/07/13 1528     Chief Complaint  Patient presents with  . Chest Pain  . Cough  . Abdominal Pain   (Consider location/radiation/quality/duration/timing/severity/associated sxs/prior Treatment) HPI Comments: Patient is 52 year old female who reports that she just moved to [place] from [place] about 1 month ago - she states she has not gotten her medicaid card yet and so she was waiting on it to see a PCP.  She reports that she was seen years ago by Dr. Tresa Endo with [facility].  She is here with multiple complaints including headache with photophobia, left sided chest pain with shortness of breath, lower abdominal pain with no mentrual period this month and vaginal discharge and exacerbation of her right lower back pain with radiation into her right thigh.  She reports that she occasionally has issues with these and that she is not taking all of her medications that she is supposed to because they cost $1 each and she has no money.  She denies being homeless.   Patient is a 52 y.o. female presenting with chest pain, cough, and abdominal pain. The history is provided by the patient. No language interpreter was used.  Chest Pain Pain location:  L chest Pain quality: not aching and not dull   Pain radiates to:  Lower back Pain radiates to the back: yes   Pain severity:  Moderate Onset quality:  Gradual Duration:  1 week Timing:  Constant Progression:  Worsening Chronicity:  Chronic Context: breathing, at rest and stress   Context: not eating, no intercourse, no movement and no trauma   Relieved by:  Nothing Worsened by:  Nothing tried Ineffective treatments:  None tried Associated symptoms: abdominal pain, back pain, cough, fatigue, headache and shortness of breath   Associated symptoms: no dizziness, no fever, no heartburn, no nausea, no near-syncope, no numbness,  no palpitations, no syncope, not vomiting and no weakness   Risk factors: coronary artery disease, hypertension, obesity and smoking   Risk factors: no birth control   Cough Associated symptoms: chest pain, headaches and shortness of breath   Associated symptoms: no fever   Abdominal Pain Associated symptoms: chest pain, cough, fatigue and shortness of breath   Associated symptoms: no fever, no nausea and no vomiting     Past Medical History  Diagnosis Date  . Coronary artery disease   . Sciatica   . Hypertension   . Myocardial infarct    Past Surgical History  Procedure Laterality Date  . Coronary angioplasty with stent placement    . Cesarean section    . Dilation and curettage, diagnostic / therapeutic     No family history on file. History  Substance Use Topics  . Smoking status: Current Every Day Smoker  . Smokeless tobacco: Not on file  . Alcohol Use: No   OB History   Grav Para Term Preterm Abortions TAB SAB Ect Mult Living                 Review of Systems  Constitutional: Positive for fatigue. Negative for fever.  Respiratory: Positive for cough and shortness of breath.   Cardiovascular: Positive for chest pain. Negative for palpitations, syncope and near-syncope.  Gastrointestinal: Positive for abdominal pain. Negative for heartburn, nausea and vomiting.  Genitourinary: Positive for pelvic pain.  Musculoskeletal: Positive for back pain.  Neurological: Positive for  headaches. Negative for dizziness, weakness and numbness.  All other systems reviewed and are negative.    Allergies  Other  Home Medications   Current Outpatient Rx  Name  Route  Sig  Dispense  Refill  . clopidogrel (PLAVIX) 75 MG tablet   Oral   Take 75 mg by mouth daily.         Marland Kitchen HYDROCHLOROTHIAZIDE PO   Oral   Take by mouth. 3 am 2 pm bid         . oxyCODONE-acetaminophen (PERCOCET/ROXICET) 5-325 MG per tablet   Oral   Take 1 tablet by mouth every 4 (four) hours as needed  for pain.         . Sertraline HCl (ZOLOFT PO)   Oral   Take 1 tablet by mouth daily.         Marland Kitchen zolpidem (AMBIEN) 10 MG tablet   Oral   Take 10 mg by mouth at bedtime as needed for sleep.          BP 140/110  Pulse 66  Temp(Src) 97.4 F (36.3 C) (Oral)  Resp 20  SpO2 100% Physical Exam  Nursing note and vitals reviewed. Constitutional: She is oriented to person, place, and time. She appears well-developed and well-nourished. No distress.  HENT:  Head: Normocephalic and atraumatic.  Right Ear: External ear normal.  Left Ear: External ear normal.  Nose: Nose normal.  Mouth/Throat: Oropharynx is clear and moist. No oropharyngeal exudate.  Eyes: Conjunctivae are normal. Pupils are equal, round, and reactive to light. No scleral icterus.  Neck: Normal range of motion. Neck supple.  Cardiovascular: Normal rate, regular rhythm and normal heart sounds.  Exam reveals no gallop and no friction rub.   No murmur heard. Pulmonary/Chest: Effort normal and breath sounds normal. No respiratory distress. She has no wheezes. She has no rales. She exhibits no tenderness.  Abdominal: Soft. Bowel sounds are normal. She exhibits no distension and no mass. There is tenderness. There is no rebound and no guarding.  Mild suprapubic abdominal pain.  Genitourinary: Vagina normal. There is no rash or tenderness on the right labia. There is no rash or tenderness on the left labia. Uterus is tender. Cervix exhibits no motion tenderness, no discharge and no friability. Right adnexum displays tenderness. Right adnexum displays no mass. Left adnexum displays tenderness. Left adnexum displays no mass. No vaginal discharge found.  Musculoskeletal: Normal range of motion. She exhibits no edema and no tenderness.  Lymphadenopathy:    She has no cervical adenopathy.  Neurological: She is alert and oriented to person, place, and time. No cranial nerve deficit. She exhibits normal muscle tone. Coordination  normal.  Skin: Skin is warm and dry. No rash noted. No erythema. No pallor.  Psychiatric: She has a normal mood and affect. Her behavior is normal. Judgment and thought content normal.    ED Course  Procedures (including critical care time) Labs Review Labs Reviewed  CBC - Abnormal; Notable for the following:    RDW 15.6 (*)    All other components within normal limits  BASIC METABOLIC PANEL - Abnormal; Notable for the following:    GFR calc non Af Amer 69 (*)    GFR calc Af Amer 79 (*)    All other components within normal limits  PRO B NATRIURETIC PEPTIDE - Abnormal; Notable for the following:    Pro B Natriuretic peptide (BNP) 224.9 (*)    All other components within normal limits  GC/CHLAMYDIA PROBE AMP  WET PREP, GENITAL  RPR  HIV ANTIBODY (ROUTINE TESTING)  URINALYSIS, ROUTINE W REFLEX MICROSCOPIC  POCT PREGNANCY, URINE  POCT I-STAT TROPONIN I   Imaging Review Dg Chest 2 View  08/07/2013   CLINICAL DATA:  Chest pain, cough.  EXAM: CHEST  2 VIEW  COMPARISON:  06/11/2011  FINDINGS: The heart size and mediastinal contours are within normal limits. Both lungs are clear. The visualized skeletal structures are unremarkable.  IMPRESSION: No active cardiopulmonary disease.   Electronically Signed   By: Charlett Nose M.D.   On: 08/07/2013 13:50    Date: 08/07/2013  Rate: 68  Rhythm: sinus  QRS Axis: low voltage QRS  Intervals: normal  ST/T Wave abnormalities: non specific ST changes  Conduction Disutrbances:none  Narrative Interpretation: Reviewed by Dr. Wilkie Aye  Old EKG Reviewed: 06/11/2011 - ST inversion in anterior leads, overall improved. 7:34 PM Patient reports no relief of pain with any medication that she has been given.  She was initially offered dilaudid IV but she reports this was like "giving her water".  She then requested norco which I gave her, she says that she normally gets "the 10mg  ones and this will not help either".  I am getting more of an impression that this  paitent has a long history of chronic pain in multiple areas.  I am getting a CT scan of abdomen based on her being so tender on examination and pelvic exam.  My suspicion is there will be no acute findings on CT scan and she will be discharged back to home.  All other labs are basically unremarkable with the exception of + clue cells, her BNP is not elevated enough to warrant admission to the hospital.  Her main concern with me was more of the "sciatica pain" in the right lower back and radiation into the right thigh.  8:47 PM Report given to G. Tomasa Blase, NP who will take over care of the patient.  I suspect CT will be negative and she will follow up on her chronic pain issues.  MDM  Chronic pain  Patient with a history of chronic pain but with multiple medical issues.  I suspect this to be an exacerbation of chronic pain.   Izola Price Marisue Humble, PA-C 08/07/13 2050

## 2013-08-07 NOTE — ED Notes (Signed)
Pt is here with sciatica pain to right side - lower back and down leg.  Pt reports right lower abdominal pain for one week.  Pt reports chest pain and has history of cardiac stent and shortness of breath

## 2013-08-07 NOTE — ED Notes (Signed)
RN came in with dilaudid and zofran- pt sts "she don't listen that's like candy for me I need hydrocodone" PA made aware.

## 2013-08-08 LAB — GC/CHLAMYDIA PROBE AMP
CT Probe RNA: NEGATIVE
GC Probe RNA: NEGATIVE

## 2013-08-08 LAB — HIV ANTIBODY (ROUTINE TESTING W REFLEX): HIV: NONREACTIVE

## 2013-08-08 NOTE — ED Provider Notes (Signed)
Medical screening examination/treatment/procedure(s) were performed by non-physician practitioner and as supervising physician I was immediately available for consultation/collaboration.  Courtney F Horton, MD 08/08/13 1024 

## 2013-08-14 MED ORDER — IOHEXOL 300 MG/ML  SOLN
100.0000 mL | Freq: Once | INTRAMUSCULAR | Status: AC | PRN
  Administered 2013-08-14: 100 mL via INTRAVENOUS

## 2013-10-02 ENCOUNTER — Encounter (HOSPITAL_COMMUNITY): Payer: Self-pay | Admitting: Emergency Medicine

## 2013-10-02 ENCOUNTER — Emergency Department (HOSPITAL_COMMUNITY): Payer: Medicaid Other

## 2013-10-02 ENCOUNTER — Emergency Department (HOSPITAL_COMMUNITY)
Admission: EM | Admit: 2013-10-02 | Discharge: 2013-10-02 | Disposition: A | Discharge status: Home / Self Care | Payer: Medicaid Other | Attending: Emergency Medicine | Admitting: Emergency Medicine

## 2013-10-02 DIAGNOSIS — Y929 Unspecified place or not applicable: Secondary | ICD-10-CM | POA: Insufficient documentation

## 2013-10-02 DIAGNOSIS — R296 Repeated falls: Secondary | ICD-10-CM | POA: Insufficient documentation

## 2013-10-02 DIAGNOSIS — I252 Old myocardial infarction: Secondary | ICD-10-CM | POA: Insufficient documentation

## 2013-10-02 DIAGNOSIS — Y939 Activity, unspecified: Secondary | ICD-10-CM | POA: Insufficient documentation

## 2013-10-02 DIAGNOSIS — Z8739 Personal history of other diseases of the musculoskeletal system and connective tissue: Secondary | ICD-10-CM | POA: Insufficient documentation

## 2013-10-02 DIAGNOSIS — F17200 Nicotine dependence, unspecified, uncomplicated: Secondary | ICD-10-CM | POA: Insufficient documentation

## 2013-10-02 DIAGNOSIS — IMO0002 Reserved for concepts with insufficient information to code with codable children: Secondary | ICD-10-CM | POA: Insufficient documentation

## 2013-10-02 DIAGNOSIS — I2510 Atherosclerotic heart disease of native coronary artery without angina pectoris: Secondary | ICD-10-CM | POA: Insufficient documentation

## 2013-10-02 DIAGNOSIS — J45909 Unspecified asthma, uncomplicated: Secondary | ICD-10-CM | POA: Insufficient documentation

## 2013-10-02 DIAGNOSIS — M549 Dorsalgia, unspecified: Secondary | ICD-10-CM

## 2013-10-02 DIAGNOSIS — Z7982 Long term (current) use of aspirin: Secondary | ICD-10-CM | POA: Insufficient documentation

## 2013-10-02 DIAGNOSIS — Z79899 Other long term (current) drug therapy: Secondary | ICD-10-CM | POA: Insufficient documentation

## 2013-10-02 DIAGNOSIS — Z9861 Coronary angioplasty status: Secondary | ICD-10-CM | POA: Insufficient documentation

## 2013-10-02 DIAGNOSIS — I10 Essential (primary) hypertension: Secondary | ICD-10-CM | POA: Insufficient documentation

## 2013-10-02 MED ORDER — OXYCODONE-ACETAMINOPHEN 5-325 MG PO TABS: 2.0000 | ORAL_TABLET | Freq: Four times a day (QID) | ORAL | Status: DC | PRN

## 2013-10-02 MED ORDER — HYDROMORPHONE HCL PF 2 MG/ML IJ SOLN
2.0000 mg | Freq: Once | INTRAMUSCULAR | Status: AC
  Administered 2013-10-02: 2 mg via INTRAMUSCULAR
  Filled 2013-10-02: qty 1

## 2013-10-02 NOTE — ED Provider Notes (Signed)
CSN: [id number]     Arrival date & time 10/02/13  1151 History   First MD Initiated Contact with Patient 10/02/13 1207     Chief Complaint  Patient presents with  . Back Pain   (Consider location/radiation/quality/duration/timing/severity/associated sxs/prior Treatment) HPI Comments: Patient presents to the emergency department with chief complaint of low back pain. Patient reports having a fall approximately 3 weeks ago. States that she's been taking Percocet for the pain. She states the pain is moderate to severe. She states that she is trying to get in touch with the regular doctor today. She does not have a PCP. Patient states that hurts to ambulate. She denies bowel or bladder incontinence. Denies any saddle anesthesia. Denies fevers, chills, nausea, or vomiting. Nothing makes the symptoms better or worse.  The history is provided by the patient. No language interpreter was used.    Past Medical History  Diagnosis Date  . Coronary artery disease   . Sciatica   . Hypertension   . Myocardial infarct   . Asthma    Past Surgical History  Procedure Laterality Date  . Coronary angioplasty with stent placement    . Cesarean section    . Dilation and curettage, diagnostic / therapeutic     History reviewed. No pertinent family history. History  Substance Use Topics  . Smoking status: Current Every Day Smoker  . Smokeless tobacco: Not on file  . Alcohol Use: No   OB History   Grav Para Term Preterm Abortions TAB SAB Ect Mult Living                 Review of Systems  All other systems reviewed and are negative.    Allergies  Other  Home Medications   Current Outpatient Rx  Name  Route  Sig  Dispense  Refill  . albuterol (PROVENTIL HFA;VENTOLIN HFA) 108 (90 BASE) MCG/ACT inhaler   Inhalation   Inhale 2 puffs into the lungs every 6 (six) hours as needed for wheezing or shortness of breath.         . fluticasone (FLOVENT HFA) 220 MCG/ACT inhaler   Inhalation  Inhale 1 puff into the lungs 2 (two) times daily.         Marland Kitchen oxyCODONE-acetaminophen (PERCOCET/ROXICET) 5-325 MG per tablet   Oral   Take 1 tablet by mouth every 4 (four) hours as needed for pain.         Marland Kitchen acyclovir (ZOVIRAX) 400 MG tablet   Oral   Take 400 mg by mouth 2 (two) times daily.         Marland Kitchen amLODipine (NORVASC) 10 MG tablet   Oral   Take 10 mg by mouth daily.         Marland Kitchen aspirin EC 325 MG tablet   Oral   Take 325 mg by mouth daily.         Marland Kitchen atorvastatin (LIPITOR) 80 MG tablet   Oral   Take 80 mg by mouth daily.         . clopidogrel (PLAVIX) 75 MG tablet   Oral   Take 75 mg by mouth daily.         . furosemide (LASIX) 20 MG tablet   Oral   Take 20 mg by mouth 2 (two) times daily.         Marland Kitchen gabapentin (NEURONTIN) 300 MG capsule   Oral   Take 300 mg by mouth 3 (three) times daily.         Marland Kitchen  HYDROCHLOROTHIAZIDE PO   Oral   Take 2-3 tablets by mouth 2 (two) times daily. 3 am and 2 pm bid         . lisinopril (PRINIVIL,ZESTRIL) 40 MG tablet   Oral   Take 40 mg by mouth daily.         . nitroGLYCERIN (NITROSTAT) 0.4 MG SL tablet   Sublingual   Place 0.4 mg under the tongue every 5 (five) minutes as needed for chest pain.         Marland Kitchen oxyCODONE-acetaminophen (PERCOCET/ROXICET) 5-325 MG per tablet   Oral   Take 2 tablets by mouth every 6 (six) hours as needed for severe pain.   15 tablet   0   . Sertraline HCl (ZOLOFT PO)   Oral   Take 1 tablet by mouth daily.         Marland Kitchen zolpidem (AMBIEN) 10 MG tablet   Oral   Take 10 mg by mouth at bedtime as needed for sleep.          BP 166/100  Pulse 82  Temp(Src) 97 F (36.1 C) (Oral)  Resp 16  SpO2 95%  LMP 06/28/2013 Physical Exam  Nursing note and vitals reviewed. Constitutional: She is oriented to person, place, and time. She appears well-developed and well-nourished. No distress.  HENT:  Head: Normocephalic and atraumatic.  Eyes: Conjunctivae and EOM are normal. Right eye  exhibits no discharge. Left eye exhibits no discharge. No scleral icterus.  Neck: Normal range of motion. Neck supple. No tracheal deviation present.  Cardiovascular: Normal rate, regular rhythm and normal heart sounds.  Exam reveals no gallop and no friction rub.   No murmur heard. Pulmonary/Chest: Effort normal and breath sounds normal. No respiratory distress. She has no wheezes.  Abdominal: Soft. She exhibits no distension. There is no tenderness.  Musculoskeletal: Normal range of motion.  Lumbar paraspinal muscles tender to palpation, no bony tenderness, step-offs, or gross abnormality or deformity of spine, patient is able to ambulate, moves all extremities  Bilateral great toe extension intact Bilateral plantar/dorsiflexion intact  Neurological: She is alert and oriented to person, place, and time. She has normal reflexes.  Sensation and strength intact bilaterally Symmetrical reflexes  Skin: Skin is warm. She is not diaphoretic.  Psychiatric: She has a normal mood and affect. Her behavior is normal. Judgment and thought content normal.    ED Course  Procedures (including critical care time) Labs Review Labs Reviewed - No data to display Imaging Review Dg Foot Complete Left  10/02/2013   CLINICAL DATA:  Fall. Left foot pain.  EXAM: LEFT FOOT - COMPLETE 3+ VIEW  COMPARISON:  None.  FINDINGS: Plantar calcaneal spur. No acute bony abnormality. Specifically, no fracture, subluxation, or dislocation. Soft tissues are intact.  IMPRESSION: No acute bony abnormality.   Electronically Signed   By: Charlett Nose M.D.   On: 10/02/2013 14:59    EKG Interpretation   None       MDM   1. Back pain   Patient with back pain.  No neurological deficits and normal neuro exam.  Patient can walk but states is painful.  No loss of bowel or bladder control.  No concern for cauda equina.  No fever, night sweats, weight loss, h/o cancer, IVDU.  RICE protocol and pain medicine indicated and discussed  with patient.      Roxy Horseman, PA-C 10/02/13 1517

## 2013-10-02 NOTE — ED Notes (Signed)
Per EMS, pt had a fall 3 weeks ago and is out of pain medication.  Lower back pain. Pt ambulatory to room and ambulance. 160/110. Pulse 110

## 2013-10-02 NOTE — ED Provider Notes (Signed)
Medical screening examination/treatment/procedure(s) were performed by non-physician practitioner and as supervising physician I was immediately available for consultation/collaboration.  Sonda Coppens L Kyron Schlitt, MD 10/02/13 1623 

## 2013-10-27 ENCOUNTER — Other Ambulatory Visit: Payer: Self-pay | Admitting: Neurosurgery

## 2013-10-27 DIAGNOSIS — M545 Low back pain: Secondary | ICD-10-CM

## 2013-11-07 ENCOUNTER — Ambulatory Visit
Admission: RE | Admit: 2013-11-07 | Discharge: 2013-11-07 | Disposition: A | Discharge status: Home / Self Care | Payer: Medicaid Other | Source: Ambulatory Visit | Attending: Neurosurgery | Admitting: Neurosurgery

## 2013-11-07 DIAGNOSIS — M545 Low back pain, unspecified: Secondary | ICD-10-CM

## 2013-12-17 ENCOUNTER — Emergency Department (HOSPITAL_COMMUNITY): Payer: Medicaid Other

## 2013-12-17 ENCOUNTER — Emergency Department (HOSPITAL_COMMUNITY)
Admission: EM | Admit: 2013-12-17 | Discharge: 2013-12-17 | Disposition: A | Discharge status: Home / Self Care | Payer: Medicaid Other | Attending: Emergency Medicine | Admitting: Emergency Medicine

## 2013-12-17 ENCOUNTER — Encounter (HOSPITAL_COMMUNITY): Payer: Self-pay | Admitting: Emergency Medicine

## 2013-12-17 DIAGNOSIS — R5383 Other fatigue: Secondary | ICD-10-CM

## 2013-12-17 DIAGNOSIS — J45901 Unspecified asthma with (acute) exacerbation: Secondary | ICD-10-CM | POA: Insufficient documentation

## 2013-12-17 DIAGNOSIS — R5381 Other malaise: Secondary | ICD-10-CM | POA: Insufficient documentation

## 2013-12-17 DIAGNOSIS — Z7982 Long term (current) use of aspirin: Secondary | ICD-10-CM | POA: Insufficient documentation

## 2013-12-17 DIAGNOSIS — I10 Essential (primary) hypertension: Secondary | ICD-10-CM | POA: Insufficient documentation

## 2013-12-17 DIAGNOSIS — F17200 Nicotine dependence, unspecified, uncomplicated: Secondary | ICD-10-CM | POA: Insufficient documentation

## 2013-12-17 DIAGNOSIS — Z9861 Coronary angioplasty status: Secondary | ICD-10-CM | POA: Insufficient documentation

## 2013-12-17 DIAGNOSIS — Z7902 Long term (current) use of antithrombotics/antiplatelets: Secondary | ICD-10-CM | POA: Insufficient documentation

## 2013-12-17 DIAGNOSIS — I2510 Atherosclerotic heart disease of native coronary artery without angina pectoris: Secondary | ICD-10-CM | POA: Insufficient documentation

## 2013-12-17 DIAGNOSIS — IMO0002 Reserved for concepts with insufficient information to code with codable children: Secondary | ICD-10-CM | POA: Insufficient documentation

## 2013-12-17 DIAGNOSIS — I252 Old myocardial infarction: Secondary | ICD-10-CM | POA: Insufficient documentation

## 2013-12-17 DIAGNOSIS — Z79899 Other long term (current) drug therapy: Secondary | ICD-10-CM | POA: Insufficient documentation

## 2013-12-17 DIAGNOSIS — J40 Bronchitis, not specified as acute or chronic: Secondary | ICD-10-CM

## 2013-12-17 LAB — POCT I-STAT, CHEM 8
BUN: 6 mg/dL (ref 6–23)
CALCIUM ION: 1.16 mmol/L (ref 1.12–1.23)
Chloride: 102 mEq/L (ref 96–112)
Creatinine, Ser: 0.9 mg/dL (ref 0.50–1.10)
Glucose, Bld: 88 mg/dL (ref 70–99)
HEMATOCRIT: 41 % (ref 36.0–46.0)
Hemoglobin: 13.9 g/dL (ref 12.0–15.0)
Potassium: 3.8 mEq/L (ref 3.7–5.3)
Sodium: 139 mEq/L (ref 137–147)
TCO2: 26 mmol/L (ref 0–100)

## 2013-12-17 LAB — POCT I-STAT TROPONIN I: Troponin i, poc: 0.01 ng/mL (ref 0.00–0.08)

## 2013-12-17 LAB — CBC WITH DIFFERENTIAL/PLATELET
BASOS PCT: 0 % (ref 0–1)
Basophils Absolute: 0 10*3/uL (ref 0.0–0.1)
EOS PCT: 3 % (ref 0–5)
Eosinophils Absolute: 0.2 10*3/uL (ref 0.0–0.7)
HEMATOCRIT: 39 % (ref 36.0–46.0)
Hemoglobin: 13.7 g/dL (ref 12.0–15.0)
Lymphocytes Relative: 23 % (ref 12–46)
Lymphs Abs: 1.1 10*3/uL (ref 0.7–4.0)
MCH: 29.3 pg (ref 26.0–34.0)
MCHC: 35.1 g/dL (ref 30.0–36.0)
MCV: 83.5 fL (ref 78.0–100.0)
MONO ABS: 0.4 10*3/uL (ref 0.1–1.0)
Monocytes Relative: 8 % (ref 3–12)
Neutro Abs: 3.1 10*3/uL (ref 1.7–7.7)
Neutrophils Relative %: 65 % (ref 43–77)
Platelets: 197 10*3/uL (ref 150–400)
RBC: 4.67 MIL/uL (ref 3.87–5.11)
RDW: 15.1 % (ref 11.5–15.5)
WBC: 4.8 10*3/uL (ref 4.0–10.5)

## 2013-12-17 MED ORDER — PREDNISONE 10 MG PO TABS: ORAL_TABLET | ORAL | Status: DC

## 2013-12-17 MED ORDER — HYDROCOD POLST-CHLORPHEN POLST 10-8 MG/5ML PO LQCR
5.0000 mL | Freq: Once | ORAL | Status: AC
  Administered 2013-12-17: 5 mL via ORAL
  Filled 2013-12-17: qty 5

## 2013-12-17 MED ORDER — ALBUTEROL SULFATE (2.5 MG/3ML) 0.083% IN NEBU
5.0000 mg | INHALATION_SOLUTION | Freq: Once | RESPIRATORY_TRACT | Status: AC
  Administered 2013-12-17: 5 mg via RESPIRATORY_TRACT
  Filled 2013-12-17: qty 6

## 2013-12-17 MED ORDER — PREDNISONE 20 MG PO TABS
60.0000 mg | ORAL_TABLET | Freq: Once | ORAL | Status: AC
  Administered 2013-12-17: 60 mg via ORAL
  Filled 2013-12-17: qty 3

## 2013-12-17 MED ORDER — HYDROCODONE-HOMATROPINE 5-1.5 MG/5ML PO SYRP: 5.0000 mL | ORAL_SOLUTION | Freq: Four times a day (QID) | ORAL | Status: DC | PRN

## 2013-12-17 MED ORDER — IPRATROPIUM BROMIDE 0.02 % IN SOLN
0.5000 mg | Freq: Once | RESPIRATORY_TRACT | Status: AC
  Administered 2013-12-17: 0.5 mg via RESPIRATORY_TRACT
  Filled 2013-12-17: qty 2.5

## 2013-12-17 NOTE — ED Notes (Signed)
Pt has not taken her lisinopril and Lipitor,  Since last july

## 2013-12-17 NOTE — ED Notes (Signed)
Patient discharged to home with family. NAD.  

## 2013-12-17 NOTE — ED Notes (Signed)
Pt here with c/o chest pain and cough , pt has not taken her plavix since last July ,

## 2013-12-17 NOTE — ED Notes (Addendum)
Pt d/c'd from IV, monitor, continuous pulse oximetry and blood pressure cuff; pt getting dressed to be discharged home at this time

## 2013-12-17 NOTE — Discharge Instructions (Signed)
Take your pro air inhaler 2 puffs every 4 hrs. Take prednisone as prescribed until all gone. Take cough medication as prescribed. Follow up with primary care doctor.  Bronchitis Bronchitis is inflammation of the airways that extend from the windpipe into the lungs (bronchi). The inflammation often causes mucus to develop, which leads to a cough. If the inflammation becomes severe, it may cause shortness of breath. CAUSES  Bronchitis may be caused by:   Viral infections.   Bacteria.   Cigarette smoke.   Allergens, pollutants, and other irritants.  SIGNS AND SYMPTOMS  The most common symptom of bronchitis is a frequent cough that produces mucus. Other symptoms include:  Fever.   Body aches.   Chest congestion.   Chills.   Shortness of breath.   Sore throat.  DIAGNOSIS  Bronchitis is usually diagnosed through a medical history and physical exam. Tests, such as chest X-rays, are sometimes done to rule out other conditions.  TREATMENT  You may need to avoid contact with whatever caused the problem (smoking, for example). Medicines are sometimes needed. These may include:  Antibiotics. These may be prescribed if the condition is caused by bacteria.  Cough suppressants. These may be prescribed for relief of cough symptoms.   Inhaled medicines. These may be prescribed to help open your airways and make it easier for you to breathe.   Steroid medicines. These may be prescribed for those with recurrent (chronic) bronchitis. HOME CARE INSTRUCTIONS  Get plenty of rest.   Drink enough fluids to keep your urine clear or pale yellow (unless you have a medical condition that requires fluid restriction). Increasing fluids may help thin your secretions and will prevent dehydration.   Only take over-the-counter or prescription medicines as directed by your health care provider.  Only take antibiotics as directed. Make sure you finish them even if you start to feel  better.  Avoid secondhand smoke, irritating chemicals, and strong fumes. These will make bronchitis worse. If you are a smoker, quit smoking. Consider using nicotine gum or skin patches to help control withdrawal symptoms. Quitting smoking will help your lungs heal faster.   Put a cool-mist humidifier in your bedroom at night to moisten the air. This may help loosen mucus. Change the water in the humidifier daily. You can also run the hot water in your shower and sit in the bathroom with the door closed for 5 10 minutes.   Follow up with your health care provider as directed.   Wash your hands frequently to avoid catching bronchitis again or spreading an infection to others.  SEEK MEDICAL CARE IF: Your symptoms do not improve after 1 week of treatment.  SEEK IMMEDIATE MEDICAL CARE IF:  Your fever increases.  You have chills.   You have chest pain.   You have worsening shortness of breath.   You have bloody sputum.  You faint.  You have lightheadedness.  You have a severe headache.   You vomit repeatedly. MAKE SURE YOU:   Understand these instructions.  Will watch your condition.  Will get help right away if you are not doing well or get worse. Document Released: 11/09/2005 Document Revised: 08/30/2013 Document Reviewed: 07/04/2013 [facility] Patient Information 2014 [place].

## 2013-12-17 NOTE — ED Notes (Addendum)
Patient transported to X-ray 

## 2013-12-17 NOTE — ED Provider Notes (Signed)
CSN: [id number]     Arrival date & time 12/17/13  1236 History   First MD Initiated Contact with Patient 12/17/13 1251     Chief Complaint  Patient presents with  . Chest Pain  . Cough   (Consider location/radiation/quality/duration/timing/severity/associated sxs/prior Treatment) HPI Tracey Manning is a 53 y.o. female who presents to emergency department complaining of cough. Patient states she has hot cough for 2 weeks. States it is getting worse. She states that she is not feeling short of breath. She also admits to chest pain only when she's coughing. She states that she has been taking multiple over-the-counter medications including TheraFlu, Alka-Seltzer, DayQuil, with no relief. Patient states she also has 3 inhalers but only has been taking "the blue one." Patient denies any fevers. She states her cough is productive. It's worse with laying down. Patient also admits to generalized weakness. She states she has not been out of her house for the last 2 weeks because of this illness. She denies any nausea, vomiting, diarrhea, abdominal pain. She states she's congested but denies any sore throat. Patient also states she wants to get checked for diabetes.  Past Medical History  Diagnosis Date  . Coronary artery disease   . Sciatica   . Hypertension   . Myocardial infarct   . Asthma    Past Surgical History  Procedure Laterality Date  . Coronary angioplasty with stent placement    . Cesarean section    . Dilation and curettage, diagnostic / therapeutic     History reviewed. No pertinent family history. History  Substance Use Topics  . Smoking status: Current Every Day Smoker  . Smokeless tobacco: Not on file  . Alcohol Use: No   OB History   Grav Para Term Preterm Abortions TAB SAB Ect Mult Living                 Review of Systems  Constitutional: Negative for fever and chills.  HENT: Positive for congestion.   Respiratory: Positive for cough, shortness of breath and  wheezing. Negative for chest tightness.   Cardiovascular: Positive for chest pain. Negative for palpitations and leg swelling.  Gastrointestinal: Negative for nausea, vomiting, abdominal pain and diarrhea.  Genitourinary: Negative for dysuria, flank pain, vaginal bleeding, vaginal discharge, vaginal pain and pelvic pain.  Musculoskeletal: Negative for arthralgias, myalgias, neck pain and neck stiffness.  Skin: Negative for rash.  Neurological: Negative for dizziness, weakness and headaches.  All other systems reviewed and are negative.    Allergies  Other  Home Medications   Current Outpatient Rx  Name  Route  Sig  Dispense  Refill  . albuterol (PROVENTIL HFA;VENTOLIN HFA) 108 (90 BASE) MCG/ACT inhaler   Inhalation   Inhale 2 puffs into the lungs every 6 (six) hours as needed for wheezing or shortness of breath.         Marland Kitchen aspirin EC 325 MG tablet   Oral   Take 325 mg by mouth daily.         Marland Kitchen amLODipine (NORVASC) 10 MG tablet   Oral   Take 10 mg by mouth daily.         Marland Kitchen atorvastatin (LIPITOR) 80 MG tablet   Oral   Take 80 mg by mouth daily.         . clopidogrel (PLAVIX) 75 MG tablet   Oral   Take 75 mg by mouth daily.         . fluticasone (FLOVENT HFA) 220 MCG/ACT inhaler  Inhalation   Inhale 1 puff into the lungs 2 (two) times daily.         Marland Kitchen HYDROCHLOROTHIAZIDE PO   Oral   Take 2-3 tablets by mouth 2 (two) times daily. 3 am and 2 pm bid         . lisinopril (PRINIVIL,ZESTRIL) 40 MG tablet   Oral   Take 40 mg by mouth daily.         . nitroGLYCERIN (NITROSTAT) 0.4 MG SL tablet   Sublingual   Place 0.4 mg under the tongue every 5 (five) minutes as needed for chest pain.          BP 154/110  Pulse 87  Temp(Src) 97.3 F (36.3 C) (Oral)  Resp 20  Ht 5\' 1"  (1.549 m)  Wt 203 lb (92.08 kg)  BMI 38.38 kg/m2  SpO2 97%  LMP 06/28/2013 Physical Exam  Nursing note and vitals reviewed. Constitutional: She appears well-developed and  well-nourished. No distress.  HENT:  Head: Normocephalic.  Eyes: Conjunctivae are normal.  Neck: Neck supple.  Cardiovascular: Normal rate, regular rhythm and normal heart sounds.   Pulmonary/Chest: Effort normal and breath sounds normal. No respiratory distress. She has no wheezes. She has no rales.  Abdominal: Soft. Bowel sounds are normal. She exhibits no distension. There is no tenderness. There is no rebound.  Musculoskeletal: She exhibits no edema.  Neurological: She is alert.  Skin: Skin is warm and dry.  Psychiatric: She has a normal mood and affect. Her behavior is normal.    ED Course  Procedures (including critical care time) Labs Review Labs Reviewed  CBC WITH DIFFERENTIAL  POCT I-STAT, CHEM 8  POCT I-STAT TROPONIN I   Imaging Review Dg Chest 2 View  12/17/2013   CLINICAL DATA:  Cough and congestion.  EXAM: CHEST  2 VIEW  COMPARISON:  08/07/2013.  FINDINGS: Mediastinum and hilar structures normal. Mild cardiomegaly, no pulmonary venous congestion. No focal infiltrate. No pleural effusion or pneumothorax. Degenerative changes thoracic spine.  IMPRESSION: Mild cardiomegaly.  No CHF.   Electronically Signed   By: Marcello Moores  Register   On: 12/17/2013 14:20    EKG Interpretation    Date/Time:  Sunday December 17 2013 12:39:07 EST Ventricular Rate:  89 PR Interval:  152 QRS Duration: 92 QT Interval:  386 QTC Calculation: 469 R Axis:   74 Text Interpretation:  Sinus rhythm with occasional Premature ventricular complexes Otherwise normal ECG No significant change since last tracing Confirmed by [facility]  MD, JOHN ([id number]) on 12/17/2013 4:19:57 PM            MDM   1. Bronchitis     Patient in emergency department complaining of cough for several weeks. No fever, no chest pain, mild shortness of breath. She does have history of coronary disease, however she denies any chest pressure. Will get lab work including EKG and troponin, chest x-ray to rule out pneumonia, will  start on neb treatments, prednisone ordered, Tussionex for cough.  3:24 PM Pt's cxr showed cardiomegally, otherwise negative. Labs normal. Pt's cough and breathing improved with neb treatment, prednisone, Tussionex. Her vital signs are normal other than slight hypertension. She'll be discharged home with prednisone, inhaler, hycodan and follow up with PCP.    Filed Vitals:   12/17/13 1242 12/17/13 1427 12/17/13 1514 12/17/13 1524  BP: 154/110  165/98   Pulse:   85   Temp:      TempSrc:      Resp:   30  Height:      Weight:      SpO2:  100% 99% 99%      Renold Genta, PA-C 12/17/13 1626

## 2013-12-17 NOTE — ED Provider Notes (Signed)
Medical screening examination/treatment/procedure(s) were performed by non-physician practitioner and as supervising physician I was immediately available for consultation/collaboration.  EKG Interpretation    Date/Time:  Sunday December 17 2013 12:39:07 EST Ventricular Rate:  89 PR Interval:  152 QRS Duration: 92 QT Interval:  386 QTC Calculation: 469 R Axis:   74 Text Interpretation:  Sinus rhythm with occasional Premature ventricular complexes Otherwise normal ECG No significant change since last tracing Confirmed by Stevie Kern  MD, Jenny Reichmann [phone]) on 12/17/2013 4:19:57 PM             Babette Relic, MD 12/17/13 2038

## 2013-12-17 NOTE — ED Notes (Signed)
Pt is out of the department at this time 

## 2014-01-04 ENCOUNTER — Encounter (HOSPITAL_COMMUNITY): Payer: Self-pay | Admitting: Emergency Medicine

## 2014-01-04 ENCOUNTER — Emergency Department (INDEPENDENT_AMBULATORY_CARE_PROVIDER_SITE_OTHER)
Admission: EM | Admit: 2014-01-04 | Discharge: 2014-01-04 | Disposition: A | Discharge status: Home / Self Care | Payer: Medicaid Other | Source: Home / Self Care | Attending: Emergency Medicine | Admitting: Emergency Medicine

## 2014-01-04 ENCOUNTER — Other Ambulatory Visit (HOSPITAL_COMMUNITY)
Admission: RE | Admit: 2014-01-04 | Discharge: 2014-01-04 | Disposition: A | Discharge status: Home / Self Care | Payer: Medicaid Other | Source: Ambulatory Visit | Attending: Emergency Medicine | Admitting: Emergency Medicine

## 2014-01-04 DIAGNOSIS — B009 Herpesviral infection, unspecified: Secondary | ICD-10-CM

## 2014-01-04 DIAGNOSIS — R109 Unspecified abdominal pain: Secondary | ICD-10-CM

## 2014-01-04 DIAGNOSIS — B373 Candidiasis of vulva and vagina: Secondary | ICD-10-CM

## 2014-01-04 DIAGNOSIS — E7800 Pure hypercholesterolemia, unspecified: Secondary | ICD-10-CM

## 2014-01-04 DIAGNOSIS — M5430 Sciatica, unspecified side: Secondary | ICD-10-CM

## 2014-01-04 DIAGNOSIS — N760 Acute vaginitis: Secondary | ICD-10-CM | POA: Insufficient documentation

## 2014-01-04 DIAGNOSIS — R519 Headache, unspecified: Secondary | ICD-10-CM

## 2014-01-04 DIAGNOSIS — R079 Chest pain, unspecified: Secondary | ICD-10-CM

## 2014-01-04 DIAGNOSIS — M549 Dorsalgia, unspecified: Secondary | ICD-10-CM

## 2014-01-04 DIAGNOSIS — J45909 Unspecified asthma, uncomplicated: Secondary | ICD-10-CM

## 2014-01-04 DIAGNOSIS — I10 Essential (primary) hypertension: Secondary | ICD-10-CM

## 2014-01-04 DIAGNOSIS — R51 Headache: Secondary | ICD-10-CM

## 2014-01-04 DIAGNOSIS — M25569 Pain in unspecified knee: Secondary | ICD-10-CM

## 2014-01-04 DIAGNOSIS — O26899 Other specified pregnancy related conditions, unspecified trimester: Secondary | ICD-10-CM

## 2014-01-04 DIAGNOSIS — I2510 Atherosclerotic heart disease of native coronary artery without angina pectoris: Secondary | ICD-10-CM

## 2014-01-04 DIAGNOSIS — Z113 Encounter for screening for infections with a predominantly sexual mode of transmission: Secondary | ICD-10-CM | POA: Insufficient documentation

## 2014-01-04 DIAGNOSIS — B3731 Acute candidiasis of vulva and vagina: Secondary | ICD-10-CM

## 2014-01-04 LAB — POCT URINALYSIS DIP (DEVICE)
Bilirubin Urine: NEGATIVE
Glucose, UA: NEGATIVE mg/dL
KETONES UR: NEGATIVE mg/dL
LEUKOCYTES UA: NEGATIVE
Nitrite: NEGATIVE
PH: 6 (ref 5.0–8.0)
PROTEIN: 30 mg/dL — AB
Specific Gravity, Urine: 1.025 (ref 1.005–1.030)
Urobilinogen, UA: 0.2 mg/dL (ref 0.0–1.0)

## 2014-01-04 LAB — POCT I-STAT, CHEM 8
BUN: 13 mg/dL (ref 6–23)
CALCIUM ION: 1.22 mmol/L (ref 1.12–1.23)
Chloride: 102 mEq/L (ref 96–112)
Creatinine, Ser: 1.2 mg/dL — ABNORMAL HIGH (ref 0.50–1.10)
GLUCOSE: 101 mg/dL — AB (ref 70–99)
HCT: 41 % (ref 36.0–46.0)
Hemoglobin: 13.9 g/dL (ref 12.0–15.0)
Potassium: 3.7 mEq/L (ref 3.7–5.3)
Sodium: 141 mEq/L (ref 137–147)
TCO2: 29 mmol/L (ref 0–100)

## 2014-01-04 LAB — POCT PREGNANCY, URINE: PREG TEST UR: NEGATIVE

## 2014-01-04 MED ORDER — NITROGLYCERIN 0.4 MG SL SUBL: 0.4000 mg | SUBLINGUAL_TABLET | SUBLINGUAL | Status: DC | PRN

## 2014-01-04 MED ORDER — FLUTICASONE PROPIONATE HFA 220 MCG/ACT IN AERO: 2.0000 | INHALATION_SPRAY | Freq: Two times a day (BID) | RESPIRATORY_TRACT | Status: DC

## 2014-01-04 MED ORDER — FLUCONAZOLE 150 MG PO TABS: 150.0000 mg | ORAL_TABLET | Freq: Once | ORAL | Status: DC

## 2014-01-04 MED ORDER — CLOPIDOGREL BISULFATE 75 MG PO TABS: 75.0000 mg | ORAL_TABLET | Freq: Every day | ORAL | Status: DC

## 2014-01-04 MED ORDER — AMLODIPINE BESYLATE 5 MG PO TABS: 5.0000 mg | ORAL_TABLET | Freq: Every day | ORAL | Status: DC

## 2014-01-04 MED ORDER — ACYCLOVIR 400 MG PO TABS: 400.0000 mg | ORAL_TABLET | Freq: Two times a day (BID) | ORAL | Status: DC

## 2014-01-04 MED ORDER — ATORVASTATIN CALCIUM 40 MG PO TABS: 40.0000 mg | ORAL_TABLET | Freq: Every day | ORAL | Status: DC

## 2014-01-04 MED ORDER — LISINOPRIL-HYDROCHLOROTHIAZIDE 20-12.5 MG PO TABS: 1.0000 | ORAL_TABLET | Freq: Every day | ORAL | Status: DC

## 2014-01-04 MED ORDER — ALBUTEROL SULFATE HFA 108 (90 BASE) MCG/ACT IN AERS: 2.0000 | INHALATION_SPRAY | Freq: Four times a day (QID) | RESPIRATORY_TRACT | Status: DC | PRN

## 2014-01-04 MED ORDER — HYDROCODONE-ACETAMINOPHEN 5-325 MG PO TABS: ORAL_TABLET | ORAL | Status: DC

## 2014-01-04 MED ORDER — FLUTICASONE-SALMETEROL 115-21 MCG/ACT IN AERO: 2.0000 | INHALATION_SPRAY | Freq: Two times a day (BID) | RESPIRATORY_TRACT | Status: DC

## 2014-01-04 NOTE — ED Provider Notes (Signed)
Chief Complaint   Chief Complaint  Patient presents with  . Hypertension  . Vaginitis    History of Present Illness   Tracey Manning is a 53 year old female who recently moved here from [place]. She comes in today for refill on blood pressure pills post multiple other medications. She has had high blood pressure for about 15 years. She's been on lisinopril 20 mg once a day, Norvasc 5 mg per day, and Lasix 2 tablets daily. She's been off all blood pressure pills for the past 5 months. She would like to get refills. She's had some headaches and some spots in front of her eyes. She denies any dizziness or shortness of breath. She has had chest pain off-and-on the last 2 days. She's not been monitoring her blood pressure. The patient states that she does try to be careful about salt and sodium intake. She's not a smoker. She also needs a refill on her Plavix. She takes this because she had a stent placed. She also takes aspirin. She also notes aching in her knees, lower back pain, sciatica, and vaginal itching and discharge. She was seen at the emergency room a couple days ago with a respiratory infection. She was given codeine-containing cough syrup. Her menses have been regular. She has had some pelvic pain. She has a history of elevated cholesterol. Other medications include nitroglycerin, Advair, albuterol, Lipitor, and acyclovir for herpes. She has a history of asthma.  Review of Systems   Other than as noted above, the patient denies any of the following symptoms: Respiratory:  No coughing, wheezing, or shortness of breath. Cardiac:  No chest pain, tightness, pressure, palpitations, syncope, or edema. Neuro:  No headache, dizziness, blurred vision, weakness, paresthesias, or strokelike symptoms.   [place]   Past medical history, family history, social history, meds, and allergies were reviewed.    Physical Examination   Vital signs:  BP 169/115  Pulse 85  Temp(Src) 98.1 F (36.7  C) (Oral)  Resp 18  SpO2 98%  LMP 12/19/2012 General:  Alert, oriented, in no distress. Lungs:  Breath sounds clear and equal bilaterally.  No wheezes, rales, or rhonchi. Heart:  Regular rhythm, no gallops, murmers, clicks or rubs.  Abdomen:  Soft and flat.  Nontender, no organomegaly or mass.  No pulsatile midline abdominal mass or bruit. Pelvic: Normal external genitalia, vaginal and cervical mucosa were normal. There was minimal discharge. She has moderate tenderness to palpation of cervical motion, uterus is normal in size and shape and moderately tender. She has moderate bilateral adnexal tenderness without a mass. DNA probes for gonorrhea, Chlamydia, Trichomonas, Gardnerella, Candida were obtained. Ext:  No edema, pulses full. Neurological exam:  Alert and oriented.  Speech is clear.  No pronator drift.  CNs intact.  Labs   Results for orders placed during the hospital encounter of 01/04/14  POCT URINALYSIS DIP (DEVICE)      Result Value Ref Range   Glucose, UA NEGATIVE  NEGATIVE mg/dL   Bilirubin Urine NEGATIVE  NEGATIVE   Ketones, ur NEGATIVE  NEGATIVE mg/dL   Specific Gravity, Urine 1.025  1.005 - 1.030   Hgb urine dipstick SMALL (*) NEGATIVE   pH 6.0  5.0 - 8.0   Protein, ur 30 (*) NEGATIVE mg/dL   Urobilinogen, UA 0.2  0.0 - 1.0 mg/dL   Nitrite NEGATIVE  NEGATIVE   Leukocytes, UA NEGATIVE  NEGATIVE  POCT I-STAT, CHEM 8      Result Value Ref Range   Sodium 141  137 - 147 mEq/L   Potassium 3.7  3.7 - 5.3 mEq/L   Chloride 102  96 - 112 mEq/L   BUN 13  6 - 23 mg/dL   Creatinine, Ser 1.20 (*) 0.50 - 1.10 mg/dL   Glucose, Bld 101 (*) 70 - 99 mg/dL   Calcium, Ion 1.22  1.12 - 1.23 mmol/L   TCO2 29  0 - 100 mmol/L   Hemoglobin 13.9  12.0 - 15.0 g/dL   HCT 41.0  36.0 - 46.0 %  POCT PREGNANCY, URINE      Result Value Ref Range   Preg Test, Ur NEGATIVE  NEGATIVE    EKG Results    Date: 01/04/2014  Rate: 73  Rhythm: normal sinus rhythm and sinus arrhythmia  QRS Axis:  left  Intervals: normal  ST/T Wave abnormalities: normal  Conduction Disutrbances:none  Narrative Interpretation: Normal sinus rhythm with sinus arrhythmia, possible left atrial enlargement, no significant change since last tracing.  Old EKG Reviewed: unchanged  Assessment   The primary encounter diagnosis was Hypertension. Diagnoses of CAD (coronary artery disease), Hypercholesteremia, Asthma, Candida vaginitis, Herpes simplex, Chest pain, Abdominal pain complicating pregnancy, Headache, Knee pain, Back pain, and Sciatica were also pertinent to this visit.  The patient has multiple medical problems. She will need followup with primary care.  Plan   1.  Meds:  The following meds were prescribed:   Discharge Medication List as of 01/04/2014  3:25 PM    START taking these medications   Details  acyclovir (ZOVIRAX) 400 MG tablet Take 1 tablet (400 mg total) by mouth 2 (two) times daily., Starting 01/04/2014, Until Discontinued, Normal    !! albuterol (PROVENTIL HFA;VENTOLIN HFA) 108 (90 BASE) MCG/ACT inhaler Inhale 2 puffs into the lungs every 6 (six) hours as needed for wheezing or shortness of breath., Starting 01/04/2014, Until Discontinued, Normal    !! amLODipine (NORVASC) 5 MG tablet Take 1 tablet (5 mg total) by mouth daily., Starting 01/04/2014, Until Discontinued, Normal    !! atorvastatin (LIPITOR) 40 MG tablet Take 1 tablet (40 mg total) by mouth daily., Starting 01/04/2014, Until Discontinued, Normal    !! clopidogrel (PLAVIX) 75 MG tablet Take 1 tablet (75 mg total) by mouth daily with breakfast., Starting 01/04/2014, Until Discontinued, Normal    fluconazole (DIFLUCAN) 150 MG tablet Take 1 tablet (150 mg total) by mouth once., Starting 01/04/2014, Normal    !! fluticasone (FLOVENT HFA) 220 MCG/ACT inhaler Inhale 2 puffs into the lungs 2 (two) times daily., Starting 01/04/2014, Until Discontinued, Normal    fluticasone-salmeterol (ADVAIR HFA) 115-21 MCG/ACT inhaler Inhale 2 puffs  into the lungs 2 (two) times daily., Starting 01/04/2014, Until Discontinued, Normal    HYDROcodone-acetaminophen (NORCO/VICODIN) 5-325 MG per tablet 1 to 2 tabs every 4 to 6 hours as needed for pain., Print    lisinopril-hydrochlorothiazide (ZESTORETIC) 20-12.5 MG per tablet Take 1 tablet by mouth daily., Starting 01/04/2014, Until Discontinued, Normal    !! nitroGLYCERIN (NITROSTAT) 0.4 MG SL tablet Place 1 tablet (0.4 mg total) under the tongue every 5 (five) minutes as needed for chest pain., Starting 01/04/2014, Until Discontinued, Normal     !! - Potential duplicate medications found. Please discuss with provider.      2.  Patient Education/Counseling:  The patient was given appropriate handouts, self care instructions, and instructed in symptomatic relief. Specifically discussed salt and sodium restriction, weight control, and exercise.   3.  Follow up:  The patient was told to follow up here if no  better in 3 to 4 days, or sooner if becoming worse in any way, and given some red flag symptoms such as severe headache, vision changes, shortness of breath, chest pain or stroke like symptoms which would prompt immediate return.  Followup at the [facility] as soon as possible.    [name], MD 01/04/14 [phone]

## 2014-01-04 NOTE — ED Notes (Signed)
C/o high blood pressure. First time taking it was 181/119 lt side, second time on right side 169/115. Stated that she has been unable to see her PCP because she is on a waiting list. Hasn't taking any medications for blood pressure. Having back and leg pain. Also stated that she has a possible yeast infection, sx include white/brown discharge,itching, and no odor. Written by: Lenore Manner, SMA

## 2014-01-04 NOTE — Discharge Instructions (Signed)
Blood pressure over the ideal can put you at higher risk for stroke, heart disease, and kidney failure.  For this reason, it's important to try to get your blood pressure as close as possible to the ideal.  The ideal blood pressure is 120/80.  Blood pressures from 469-629 systolic over 52-84 diastolic are labeled as "prehypertension."  This means you are at higher risk of developing hypertension in the future.  Blood pressures in this range are not treated with medication, but lifestyle changes are recommended to prevent progression to hypertension.  Blood pressures of 132 and above systolic over 90 and above diastolic are classified as hypertension and are treated with medications.  Lifestyle changes which can benefit both prehypertension and hypertension include the following:   Salt and sodium restriction.  Weight loss.  Regular exercise.  Avoidance of tobacco.  Avoidance of excess alcohol.  The "D.A.S.H" diet.   People with hypertension and prehypertension should limit their salt intake to less than 1500 mg daily.  Reading the nutrition information on the label of many prepared foods can give you an idea of how much sodium you're consuming at each meal.  Remember that the most important number on the nutrition information is the serving size.  It may be smaller than you think.  Try to avoid adding extra salt at the table.  You may add small amounts of salt while cooking.  Remember that salt is an acquired taste and you may get used to a using a whole lot less salt than you are using now.  Using less salt lets the food's natural flavors come through.  You might want to consider using salt substitutes, potassium chloride, pepper, or blends of herbs and spices to enhance the flavor of your food.  Foods that contain the most salt include: processed meats (like ham, bacon, lunch meat, sausage, hot dogs, and breakfast meat), chips, pretzels, salted nuts, soups, salty snacks, canned foods, junk  food, fast food, restaurant food, mustard, pickles, pizza, popcorn, soy sauce, and worcestershire sauce--quite a list!  You might ask, "Is there anything I can eat?"  The answer is, "yes."  Fruits and vegetables are usually low in salt.  Fresh is better than frozen which is better than canned.  If you have canned vegetables, you can cut down on the salt content by rinsing them in tap water 3 times before cooking.     Weight loss is the second thing you can do to lower your blood pressure.  Getting to and maintaining ideal weight will often normalize your blood pressure and allow you to avoid medications, entirely, cut way down on your dosage of medications, or allow to wean off your meds.  (Note, this should only be done under the supervision of your primary care doctor.)  Of course, weight loss takes time and you may need to be on medication in the meantime.  You shoot for a body mass index of 20-25.  When you go to the urgent care or to your primary care doctor, they should calculate your BMI.  If you don't know what it is, ask.  You can calculate your BMI with the following formula:  Weight in pounds x 703/ (height in inches) x (height in inches).  There are many good diets out there: Weight Watchers and the D.A.S.H. Diet are the best, but often, just modifying a few factors can be helpful:  Don't skip meals, don't eat out, and keeping a food diary.  I do not recommend  fad diets or diet pills which often raise blood pressure.  ° °· Everyone should get regular exercise, but this is particularly important for people with high blood pressure.  Just about any exercise is good.  The only exercise which may be harmful is lifting extreme heavy weights.  I recommend moderate exercise such as walking for 30 minutes 5 days a week.  Going to the gym for a 50 minute workout 3 times a week is also good.  This amounts to 150 minutes of exercise weekly. ° °· Anyone with high blood pressure should avoid any use of tobacco.   Tobacco use does not elevate blood pressure, but it increases the risk of heart disease and stroke.  If you are interested in quitting, discuss with your doctor how to quit.  If you are not interested in quitting, ask yourself, "What would my life be like in 10 years if I continue to smoke?"  "How will I know when it is time to quit?"  "How would my life be better if I were to quit." ° °· Excess alcohol intake can raise the blood pressure.  The safe alcohol intake is 2 drinks or less per day for men and 1 drink per day or less for women. ° °· There is a very good diet which I recommend that has been designed for people with blood pressure called the D.A.S.H. Diet (dietary approaches to stop hypertension).  It consists of fruits, vegetables, lean meats, low fat dairy, whole grains, nuts and seeds.  It is very low in salt and sodium.  It has also been found to have other beneficial health effects such as lowering cholesterol and helping lose weight.  It has been developed by the National Institutes of [employer] and can be downloaded from the internet without any cost. Just do a web search on "D.A.S.H. Diet." or go the NIH website (www.nih.gov).  There are also cookbooks and diet plans that can be gotten from [employer] to help you with this diet. °·  °

## 2014-01-05 ENCOUNTER — Telehealth (HOSPITAL_COMMUNITY): Payer: Self-pay | Admitting: Emergency Medicine

## 2014-01-05 MED ORDER — METRONIDAZOLE 500 MG PO TABS: 500.0000 mg | ORAL_TABLET | Freq: Two times a day (BID) | ORAL | Status: DC

## 2014-01-05 NOTE — ED Notes (Signed)
DNA probe came back positive for Gardnerella. There was no yeast present. All other testing was negative. She was treated with Diflucan only. She was not treated with metronidazole. She will need metronidazole 500 mg, #14, 1 twice a day. We will send into her [facility] [address]. We will need to call and inform the patient of this result.  [name], MD 01/05/14 [phone]

## 2014-01-08 ENCOUNTER — Telehealth (HOSPITAL_COMMUNITY): Payer: Self-pay | Admitting: *Deleted

## 2014-01-08 NOTE — ED Notes (Signed)
GC/Chlamydia neg., Affirm: Candida and Trich neg., Gardnerella pos. Dr. Jake Michaelis e-prescribed Flagyl to pt. 's pharmacy.  I called pt. Pt. verified x 2 and given results.  Pt. told she needs Flagyl for bacterial vaginosis and where to pick up her Rx. Pt.'s questions answered.   Pt. instructed to no alcohol while taking this medication. Tracey Manning 01/08/2014

## 2014-03-14 ENCOUNTER — Other Ambulatory Visit: Payer: Self-pay

## 2014-03-14 DIAGNOSIS — Z1231 Encounter for screening mammogram for malignant neoplasm of breast: Secondary | ICD-10-CM

## 2014-03-29 ENCOUNTER — Encounter: Payer: Self-pay | Admitting: *Deleted

## 2014-03-29 ENCOUNTER — Telehealth: Payer: Self-pay | Admitting: Cardiovascular Disease

## 2014-03-30 ENCOUNTER — Encounter: Payer: Self-pay | Admitting: Cardiovascular Disease

## 2014-03-30 NOTE — Telephone Encounter (Signed)
Closed encounter °

## 2014-04-03 ENCOUNTER — Ambulatory Visit
Admission: RE | Admit: 2014-04-03 | Discharge: 2014-04-03 | Disposition: A | Discharge status: Home / Self Care | Payer: Medicaid Other | Source: Ambulatory Visit

## 2014-04-03 ENCOUNTER — Encounter (INDEPENDENT_AMBULATORY_CARE_PROVIDER_SITE_OTHER): Payer: Self-pay

## 2014-04-03 DIAGNOSIS — Z1231 Encounter for screening mammogram for malignant neoplasm of breast: Secondary | ICD-10-CM

## 2014-04-04 ENCOUNTER — Ambulatory Visit (INDEPENDENT_AMBULATORY_CARE_PROVIDER_SITE_OTHER): Payer: Medicaid Other | Admitting: Cardiovascular Disease

## 2014-04-04 ENCOUNTER — Encounter: Payer: Self-pay | Admitting: Cardiovascular Disease

## 2014-04-04 VITALS — BP 158/116 | HR 67 | Ht 61.0 in | Wt 202.9 lb

## 2014-04-04 DIAGNOSIS — E785 Hyperlipidemia, unspecified: Secondary | ICD-10-CM

## 2014-04-04 DIAGNOSIS — Z79899 Other long term (current) drug therapy: Secondary | ICD-10-CM

## 2014-04-04 DIAGNOSIS — I2510 Atherosclerotic heart disease of native coronary artery without angina pectoris: Secondary | ICD-10-CM

## 2014-04-04 DIAGNOSIS — R079 Chest pain, unspecified: Secondary | ICD-10-CM

## 2014-04-04 DIAGNOSIS — I10 Essential (primary) hypertension: Secondary | ICD-10-CM

## 2014-04-04 DIAGNOSIS — Z9861 Coronary angioplasty status: Secondary | ICD-10-CM | POA: Insufficient documentation

## 2014-04-04 DIAGNOSIS — R0602 Shortness of breath: Secondary | ICD-10-CM

## 2014-04-04 MED ORDER — LISINOPRIL-HYDROCHLOROTHIAZIDE 20-12.5 MG PO TABS: 1.0000 | ORAL_TABLET | Freq: Two times a day (BID) | ORAL | Status: DC

## 2014-04-04 NOTE — Progress Notes (Signed)
04/04/2014 Tracey Manning   [birth year]  [id number]  Primary Physician No PCP Per Patient Primary Cardiologist: [name] MD Renae Gloss   HPI:  Tracey Manning is a 53 year old moderately overweight single African American female mother of 2 children, grandmother and 2 grandchildren who currently is out of work on [employer] and disability. She was referred by Dr. Shawna Orleans for cardiovascular evaluation because of chest pain. Her cardiac risk factor profile is positive for 20-pack-years of tobacco abuse currently smoking one pack per day, treated type of pressure and high cholesterol. She has had multiple heart attacks in the past dating back to 2004. She had a stent placed in her circumflex by Dr. Einar Gip December 2008 (Taxus 2.5 x 24 mm). She had a Medtronic stent placed in her proximal LAD by Dr. Claiborne Billings 06/11/11. Over the last 6 months she has developed recurrent chest pain as well as dyspnea on exertion.   Current Outpatient Prescriptions  Medication Sig Dispense Refill  . acyclovir (ZOVIRAX) 400 MG tablet Take 1 tablet (400 mg total) by mouth 2 (two) times daily.  60 tablet  2  . albuterol (PROVENTIL HFA;VENTOLIN HFA) 108 (90 BASE) MCG/ACT inhaler Inhale 2 puffs into the lungs every 6 (six) hours as needed for wheezing or shortness of breath.      Marland Kitchen amLODipine (NORVASC) 5 MG tablet Take 1 tablet (5 mg total) by mouth daily.  30 tablet  2  . aspirin EC 325 MG tablet Take 325 mg by mouth daily.      Marland Kitchen atorvastatin (LIPITOR) 40 MG tablet Take 1 tablet (40 mg total) by mouth daily.  30 tablet  2  . clopidogrel (PLAVIX) 75 MG tablet Take 1 tablet (75 mg total) by mouth daily with breakfast.  30 tablet  2  . fluconazole (DIFLUCAN) 150 MG tablet Take 1 tablet (150 mg total) by mouth once.  1 tablet  0  . fluticasone (FLOVENT HFA) 220 MCG/ACT inhaler Inhale 1 puff into the lungs 2 (two) times daily.      . fluticasone-salmeterol (ADVAIR HFA) 115-21 MCG/ACT inhaler Inhale 2  puffs into the lungs 2 (two) times daily.  1 Inhaler  12  . HYDROcodone-acetaminophen (NORCO/VICODIN) 5-325 MG per tablet 1 to 2 tabs every 4 to 6 hours as needed for pain.  20 tablet  0  . HYDROcodone-homatropine (HYCODAN) 5-1.5 MG/5ML syrup Take 5 mLs by mouth every 6 (six) hours as needed for cough.  120 mL  0  . lisinopril-hydrochlorothiazide (ZESTORETIC) 20-12.5 MG per tablet Take 1 tablet by mouth daily.  30 tablet  2  . metroNIDAZOLE (FLAGYL) 500 MG tablet Take 1 tablet (500 mg total) by mouth 2 (two) times daily.  14 tablet  0  . nitroGLYCERIN (NITROSTAT) 0.4 MG SL tablet Place 0.4 mg under the tongue every 5 (five) minutes as needed for chest pain.      . nitroGLYCERIN (NITROSTAT) 0.4 MG SL tablet Place 1 tablet (0.4 mg total) under the tongue every 5 (five) minutes as needed for chest pain.  30 tablet  12   No current facility-administered medications for this visit.    Allergies  Allergen Reactions  . Other Anaphylaxis    Fresh strawberries    History   Social History  . Marital Status: Single    Spouse Name: N/A    Number of Children: 2  . Years of Education: 12   Occupational History  . Not on file.   Social History Main Topics  .  Smoking status: Current Every Day Smoker -- 0.50 packs/day    Types: Cigarettes  . Smokeless tobacco: Not on file  . Alcohol Use: No  . Drug Use: Yes    Special: Marijuana  . Sexual Activity: Not on file   Other Topics Concern  . Not on file   Social History Narrative  . No narrative on file     Review of Systems: General: negative for chills, fever, night sweats or weight changes.  Cardiovascular: negative for chest pain, dyspnea on exertion, edema, orthopnea, palpitations, paroxysmal nocturnal dyspnea or shortness of breath Dermatological: negative for rash Respiratory: negative for cough or wheezing Urologic: negative for hematuria Abdominal: negative for nausea, vomiting, diarrhea, bright red blood per rectum, melena, or  hematemesis Neurologic: negative for visual changes, syncope, or dizziness All other systems reviewed and are otherwise negative except as noted above.    Blood pressure 158/116, pulse 67, height 5\' 1"  (1.549 m), weight 202 lb 14.4 oz (92.035 kg), last menstrual period 12/19/2012.  General appearance: alert and no distress Neck: no adenopathy, no carotid bruit, no JVD, supple, symmetrical, trachea midline and thyroid not enlarged, symmetric, no tenderness/mass/nodules Lungs: clear to auscultation bilaterally Heart: regular rate and rhythm, S1, S2 normal, no murmur, click, rub or gallop Extremities: extremities normal, atraumatic, no cyanosis or edema and 2+ pedal pulses bilaterally  EKG normal sinus rhythm at 67 without ST or T wave changes  ASSESSMENT AND PLAN:   Coronary artery disease History of myocardial infarction in the past with multiple coronary stents. She had a Taxus drug-eluting stent placed to her circumflex coronary artery December 2008 by Dr. Einar Gip She underwent cardiac catheterization by Dr. Claiborne Billings originally 06/11/11 and had a Medtronic bare-metal stent placed in her proximal LAD. Her risk factors include ongoing tobacco abuse, hypertension and hyperlipidemia. Over the last 6 months she's had recurrent chest pain occurring several times a month along with dyspnea on exertion. I'm going to get an enzymologic Myoview stress test and 2-D echocardiogram, I will see her back after that.  Essential hypertension Poorly controlled on current medications. I am going to increase her lisinopril chlorothiazide.  Hyperlipidemia On statin therapy. We'll recheck a lipid and liver profile      [name] MD [facility], [facility] 04/04/2014 12:28 PM

## 2014-04-04 NOTE — Assessment & Plan Note (Signed)
History of myocardial infarction in the past with multiple coronary stents. She had a Taxus drug-eluting stent placed to her circumflex coronary artery December 2008 by Dr. Einar Gip She underwent cardiac catheterization by Dr. Claiborne Billings originally 06/11/11 and had a Medtronic bare-metal stent placed in her proximal LAD. Her risk factors include ongoing tobacco abuse, hypertension and hyperlipidemia. Over the last 6 months she's had recurrent chest pain occurring several times a month along with dyspnea on exertion. I'm going to get an enzymologic Myoview stress test and 2-D echocardiogram, I will see her back after that.

## 2014-04-04 NOTE — Assessment & Plan Note (Signed)
On statin therapy. We'll recheck a lipid and liver profile 

## 2014-04-04 NOTE — Assessment & Plan Note (Signed)
Poorly controlled on current medications. I am going to increase her lisinopril chlorothiazide.

## 2014-04-04 NOTE — Patient Instructions (Signed)
  We will see you back in follow up after the tests  Dr Gwenlyn Found has ordered :  1.  Echocardiogram. Echocardiography is a painless test that uses sound waves to create images of your heart. It provides your doctor with information about the size and shape of your heart and how well your heart's chambers and valves are working. This procedure takes approximately one hour. There are no restrictions for this procedure.   2. Lexiscan Myoview- this is a test that looks at the blood flow to your heart muscle.  It takes approximately 2 1/2 hours. Please follow instruction sheet, as given.   3. Increase the lisinopril hct to twice a day  4. Your physician recommends that you return for a FASTING lipid profile

## 2014-04-12 ENCOUNTER — Telehealth (HOSPITAL_COMMUNITY): Payer: Self-pay

## 2014-04-17 ENCOUNTER — Encounter (HOSPITAL_COMMUNITY): Payer: Self-pay | Admitting: Emergency Medicine

## 2014-04-17 ENCOUNTER — Ambulatory Visit (HOSPITAL_COMMUNITY)
Admission: RE | Admit: 2014-04-17 | Discharge: 2014-04-17 | Disposition: A | Discharge status: Home / Self Care | Payer: Medicaid Other | Source: Ambulatory Visit | Attending: Cardiovascular Disease | Admitting: Cardiovascular Disease

## 2014-04-17 ENCOUNTER — Emergency Department (HOSPITAL_COMMUNITY): Payer: Medicaid Other

## 2014-04-17 ENCOUNTER — Emergency Department (HOSPITAL_COMMUNITY)
Admission: EM | Admit: 2014-04-17 | Discharge: 2014-04-18 | Disposition: A | Discharge status: Home / Self Care | Payer: Medicaid Other | Attending: Emergency Medicine | Admitting: Emergency Medicine

## 2014-04-17 ENCOUNTER — Other Ambulatory Visit: Payer: Self-pay

## 2014-04-17 ENCOUNTER — Ambulatory Visit (HOSPITAL_BASED_OUTPATIENT_CLINIC_OR_DEPARTMENT_OTHER)
Admission: RE | Admit: 2014-04-17 | Discharge: 2014-04-17 | Disposition: A | Discharge status: Home / Self Care | Payer: Medicaid Other | Source: Ambulatory Visit | Attending: Cardiovascular Disease | Admitting: Cardiovascular Disease

## 2014-04-17 DIAGNOSIS — I519 Heart disease, unspecified: Secondary | ICD-10-CM

## 2014-04-17 DIAGNOSIS — R079 Chest pain, unspecified: Secondary | ICD-10-CM

## 2014-04-17 DIAGNOSIS — E785 Hyperlipidemia, unspecified: Secondary | ICD-10-CM | POA: Insufficient documentation

## 2014-04-17 DIAGNOSIS — J45909 Unspecified asthma, uncomplicated: Secondary | ICD-10-CM | POA: Insufficient documentation

## 2014-04-17 DIAGNOSIS — Z7982 Long term (current) use of aspirin: Secondary | ICD-10-CM | POA: Insufficient documentation

## 2014-04-17 DIAGNOSIS — R0602 Shortness of breath: Secondary | ICD-10-CM

## 2014-04-17 DIAGNOSIS — F17200 Nicotine dependence, unspecified, uncomplicated: Secondary | ICD-10-CM | POA: Insufficient documentation

## 2014-04-17 DIAGNOSIS — I2510 Atherosclerotic heart disease of native coronary artery without angina pectoris: Secondary | ICD-10-CM | POA: Insufficient documentation

## 2014-04-17 DIAGNOSIS — Z79899 Other long term (current) drug therapy: Secondary | ICD-10-CM | POA: Insufficient documentation

## 2014-04-17 DIAGNOSIS — M5430 Sciatica, unspecified side: Secondary | ICD-10-CM | POA: Insufficient documentation

## 2014-04-17 DIAGNOSIS — Z9889 Other specified postprocedural states: Secondary | ICD-10-CM | POA: Insufficient documentation

## 2014-04-17 DIAGNOSIS — Z794 Long term (current) use of insulin: Secondary | ICD-10-CM | POA: Insufficient documentation

## 2014-04-17 DIAGNOSIS — I252 Old myocardial infarction: Secondary | ICD-10-CM | POA: Insufficient documentation

## 2014-04-17 DIAGNOSIS — Z9861 Coronary angioplasty status: Secondary | ICD-10-CM | POA: Insufficient documentation

## 2014-04-17 DIAGNOSIS — Z87891 Personal history of nicotine dependence: Secondary | ICD-10-CM | POA: Insufficient documentation

## 2014-04-17 DIAGNOSIS — I10 Essential (primary) hypertension: Secondary | ICD-10-CM | POA: Insufficient documentation

## 2014-04-17 DIAGNOSIS — Z91040 Latex allergy status: Secondary | ICD-10-CM | POA: Insufficient documentation

## 2014-04-17 LAB — CBC WITH DIFFERENTIAL/PLATELET
Basophils Absolute: 0 10*3/uL (ref 0.0–0.1)
Basophils Relative: 0 % (ref 0–1)
EOS PCT: 1 % (ref 0–5)
Eosinophils Absolute: 0.1 10*3/uL (ref 0.0–0.7)
HCT: 39.3 % (ref 36.0–46.0)
Hemoglobin: 14.4 g/dL (ref 12.0–15.0)
LYMPHS ABS: 1.5 10*3/uL (ref 0.7–4.0)
Lymphocytes Relative: 15 % (ref 12–46)
MCH: 31 pg (ref 26.0–34.0)
MCHC: 36.6 g/dL — ABNORMAL HIGH (ref 30.0–36.0)
MCV: 84.7 fL (ref 78.0–100.0)
MONO ABS: 0.6 10*3/uL (ref 0.1–1.0)
Monocytes Relative: 6 % (ref 3–12)
Neutro Abs: 7.5 10*3/uL (ref 1.7–7.7)
Neutrophils Relative %: 77 % (ref 43–77)
PLATELETS: 223 10*3/uL (ref 150–400)
RBC: 4.64 MIL/uL (ref 3.87–5.11)
RDW: 15.6 % — ABNORMAL HIGH (ref 11.5–15.5)
WBC: 9.7 10*3/uL (ref 4.0–10.5)

## 2014-04-17 LAB — BASIC METABOLIC PANEL
BUN: 12 mg/dL (ref 6–23)
CALCIUM: 9.8 mg/dL (ref 8.4–10.5)
CO2: 25 meq/L (ref 19–32)
CREATININE: 0.74 mg/dL (ref 0.50–1.10)
Chloride: 97 mEq/L (ref 96–112)
GFR calc Af Amer: >90 mL/min (ref >90)
GLUCOSE: 89 mg/dL (ref 70–99)
Potassium: 3.6 mEq/L — ABNORMAL LOW (ref 3.7–5.3)
Sodium: 136 mEq/L — ABNORMAL LOW (ref 137–147)

## 2014-04-17 LAB — TROPONIN I: Troponin I: <0.3 ng/mL (ref <0.30)

## 2014-04-17 LAB — PRO B NATRIURETIC PEPTIDE: Pro B Natriuretic peptide (BNP): 168.7 pg/mL — ABNORMAL HIGH (ref 0–125)

## 2014-04-17 MED ORDER — MORPHINE SULFATE 4 MG/ML IJ SOLN
4.0000 mg | Freq: Once | INTRAMUSCULAR | Status: AC
  Administered 2014-04-18: 4 mg via INTRAVENOUS
  Filled 2014-04-17: qty 1

## 2014-04-17 MED ORDER — REGADENOSON 0.4 MG/5ML IV SOLN
0.4000 mg | Freq: Once | INTRAVENOUS | Status: AC
Start: 2014-04-17 — End: 2014-04-17
  Administered 2014-04-17: 0.4 mg via INTRAVENOUS

## 2014-04-17 MED ORDER — ONDANSETRON HCL 4 MG/2ML IJ SOLN
4.0000 mg | Freq: Once | INTRAMUSCULAR | Status: AC
  Administered 2014-04-18: 4 mg via INTRAVENOUS
  Filled 2014-04-17: qty 2

## 2014-04-17 MED ORDER — AMINOPHYLLINE 25 MG/ML IV SOLN
75.0000 mg | Freq: Once | INTRAVENOUS | Status: AC
  Administered 2014-04-17: 75 mg via INTRAVENOUS

## 2014-04-17 MED ORDER — TECHNETIUM TC 99M SESTAMIBI GENERIC - CARDIOLITE
10.8000 | Freq: Once | INTRAVENOUS | Status: AC | PRN
  Administered 2014-04-17: 11 via INTRAVENOUS

## 2014-04-17 MED ORDER — TECHNETIUM TC 99M SESTAMIBI GENERIC - CARDIOLITE
30.4000 | Freq: Once | INTRAVENOUS | Status: AC | PRN
Start: 2014-04-17 — End: 2014-04-17
  Administered 2014-04-17: 30 via INTRAVENOUS

## 2014-04-17 NOTE — ED Provider Notes (Signed)
CSN: [id number]     Arrival date & time 04/17/14  2100 History   First MD Initiated Contact with Patient 04/17/14 2137     Chief Complaint  Patient presents with  . Chest Pain     (Consider location/radiation/quality/duration/timing/severity/associated sxs/prior Treatment) HPI Comments: Patient presents to the ER for evaluation of chest pain. Patient reports that she developed chest pain after coming home from her stress test and cardiac echo. She reports that she laid down because she was tired, woke up with it he heaviness and pressure over the left side of her chest that radiates into the arm. Pain was severe when it started. She took nitroglycerin at home and reports that it did help the pain. Pain is now down to 8/10, significantly improved from previous. She also has a headache now since taking the nitroglycerin. There is no shortness of breath.  Patient is a 53 y.o. female presenting with chest pain.  Chest Pain   Past Medical History  Diagnosis Date  . Coronary artery disease   . Sciatica   . Hypertension   . Myocardial infarct   . Asthma   . Dyslipidemia   . Tobacco abuse    Past Surgical History  Procedure Laterality Date  . Cesarean section    . Dilation and curettage, diagnostic / therapeutic    . Nm myocar perf wall motion  08/2009    persantine myoview - normal perfusion in all regions, perfusion defect in anterior region (breast attenuation), EF 52%, low risk scan  . Cardiac catheterization  10/09/2003    [place] - LAD with 30% prox narrowing, 50% stenosis in mid-portion of PLA; RCA with 40% narrowing proximally ([name], [name])  . Cardiac catheterization  10/10/2007    70% stenosis in first septal perforator branch of LAD, 60-70% narrowing in mid LAD, 20% narrowing in mid AV groove Cfx with 80% diffuse narrowing in small distal marginal, total occlusion of mid RCA with L to R collaterals, 90% stenosis diffusely in prox branch of RCA  followed by 70% stenosis in secondary curve & 80% stenosis in small marginal branch ([name])  . Coronary angioplasty with stent placement  10/31/2007    PCI of distal Cfx with 2.25x39mm Taxus Adam DES, 60% narrowing of mid LAD ([name])  . Cardiac catheterization  05/28/2008    normal L main, RCA with 100% prox lesion w/distal filling from LAD collaterals, LAD with 20% prox tubular lesion/ 40% mid LAD lesion/previous stent patent ([name])  . Cardiac catheterization  09/15/2009    discrete 100% osital RCA lesion, 50% prox LAD lesion, non-obstructive disease in all coronaries ([name])  . Coronary angioplasty with stent placement  06/11/2011    PCI of prox-mid LAD with 3x2mm DES Resolute ([name])   Family History  Problem Relation Age of Onset  . Leukemia Mother   . Clotting disorder Father     blood clot  . Hypertension Sister   . Stroke Sister 29  . Bleeding Disorder Son     ITP "free bleeding disorder"   History  Substance Use Topics  . Smoking status: Current Every Day Smoker -- 0.50 packs/day    Types: Cigarettes  . Smokeless tobacco: Not on file  . Alcohol Use: No   OB History   Grav Para Term Preterm Abortions TAB SAB Ect Mult Living  Review of Systems  Cardiovascular: Positive for chest pain.  All other systems reviewed and are negative.     Allergies  Other; Dilaudid; and Latex  Home Medications   Prior to Admission medications   Medication Sig Start Date End Date Taking? Authorizing Provider  acyclovir (ZOVIRAX) 400 MG tablet Take 400 mg by mouth daily.   Yes Historical Provider, MD  albuterol (PROVENTIL HFA;VENTOLIN HFA) 108 (90 BASE) MCG/ACT inhaler Inhale 2 puffs into the lungs every 6 (six) hours as needed for wheezing or shortness of breath.   Yes Historical Provider, MD  amLODipine (NORVASC) 5 MG tablet Take 10 mg by mouth daily.   Yes Historical Provider, MD  aspirin EC 325 MG tablet Take 325 mg by mouth daily.    Yes Historical Provider, MD  atorvastatin (LIPITOR) 40 MG tablet Take 1 tablet (40 mg total) by mouth daily. 01/04/14  Yes [name], MD  clopidogrel (PLAVIX) 75 MG tablet Take 1 tablet (75 mg total) by mouth daily with breakfast. 01/04/14  Yes [name], MD  fluticasone (FLOVENT HFA) 220 MCG/ACT inhaler Inhale 1 puff into the lungs 2 (two) times daily.   Yes Historical Provider, MD  fluticasone-salmeterol (ADVAIR HFA) 115-21 MCG/ACT inhaler Inhale 2 puffs into the lungs 2 (two) times daily. 01/04/14  Yes [name], MD  lisinopril-hydrochlorothiazide (PRINZIDE,ZESTORETIC) 20-12.5 MG per tablet Take 2 tablets by mouth daily.   Yes Historical Provider, MD  nitroGLYCERIN (NITROSTAT) 0.4 MG SL tablet Place 0.4 mg under the tongue every 5 (five) minutes as needed for chest pain.   Yes Historical Provider, MD  zolpidem (AMBIEN) 10 MG tablet Take 10 mg by mouth at bedtime as needed for sleep.   Yes Historical Provider, MD   BP 132/76  Pulse 65  Temp(Src) 97.9 F (36.6 C) (Oral)  Resp 18  Ht 5\' 1"  (1.549 m)  Wt 202 lb (91.627 kg)  BMI 38.19 kg/m2  SpO2 98%  LMP 12/19/2012 Physical Exam  Constitutional: She is oriented to person, place, and time. She appears well-developed and well-nourished. No distress.  HENT:  Head: Normocephalic and atraumatic.  Right Ear: Hearing normal.  Left Ear: Hearing normal.  Nose: Nose normal.  Mouth/Throat: Oropharynx is clear and moist and mucous membranes are normal.  Eyes: Conjunctivae and EOM are normal. Pupils are equal, round, and reactive to light.  Neck: Normal range of motion. Neck supple.  Cardiovascular: Regular rhythm, S1 normal and S2 normal.  Exam reveals no gallop and no friction rub.   No murmur heard. Pulmonary/Chest: Effort normal and breath sounds normal. No respiratory distress. She exhibits no tenderness.  Abdominal: Soft. Normal appearance and bowel sounds are normal. There is no hepatosplenomegaly. There is no tenderness. There  is no rebound, no guarding, no tenderness at McBurney's point and negative Murphy's sign. No hernia.  Musculoskeletal: Normal range of motion.  Neurological: She is alert and oriented to person, place, and time. She has normal strength. No cranial nerve deficit or sensory deficit. Coordination normal. GCS eye subscore is 4. GCS verbal subscore is 5. GCS motor subscore is 6.  Skin: Skin is warm, dry and intact. No rash noted. No cyanosis.  Psychiatric: She has a normal mood and affect. Her speech is normal and behavior is normal. Thought content normal.    ED Course  Procedures (including critical care time) Labs Review Labs Reviewed  CBC WITH DIFFERENTIAL - Abnormal; Notable for the following:    MCHC 36.6 (*)    RDW  15.6 (*)    All other components within normal limits  BASIC METABOLIC PANEL - Abnormal; Notable for the following:    Sodium 136 (*)    Potassium 3.6 (*)    All other components within normal limits  PRO B NATRIURETIC PEPTIDE - Abnormal; Notable for the following:    Pro B Natriuretic peptide (BNP) 168.7 (*)    All other components within normal limits  TROPONIN I  TROPONIN I    Imaging Review Dg Chest 2 View  04/17/2014   CLINICAL DATA:  Left arm and chest pain.  EXAM: CHEST  2 VIEW  COMPARISON:  Chest radiograph performed 12/17/2013  FINDINGS: The lungs are well-aerated and clear. There is no evidence of focal opacification, pleural effusion or pneumothorax.  The heart is normal in size; the mediastinal contour is within normal limits. No acute osseous abnormalities are seen.  IMPRESSION: No acute cardiopulmonary process seen.   Electronically Signed   By: Garald Balding M.D.   On: 04/17/2014 23:37     EKG Interpretation   Date/Time:  Tuesday Apr 17 2014 22:40:48 EDT Ventricular Rate:  63 PR Interval:  173 QRS Duration: 93 QT Interval:  446 QTC Calculation: 457 R Axis:   26 Text Interpretation:  Age not entered, assumed to be  53 years old for  purpose of ECG  interpretation Sinus rhythm Borderline low voltage,  extremity leads ED PHYSICIAN INTERPRETATION AVAILABLE IN [facility]  Confirmed by TEST, Record ([id number]) on 04/19/2014 7:07:02 AM      Date: 04/17/2014  Rate: 63  Rhythm: normal sinus rhythm  QRS Axis: normal  Intervals: normal  ST/T Wave abnormalities: normal  Conduction Disutrbances:none  Narrative Interpretation:   Old EKG Reviewed: unchanged    MDM   Final diagnoses:  Chest pain  CAD (coronary artery disease)   Patient presents to the ER for evaluation of chest pain. Patient has a history of coronary artery disease, has had previous stenting. She appears, however, to have a somewhat chronic chest pain syndrome as she has been seen multiple times for this. She has been worked up by her primary doctor including heart attack echo and nuclear medicine perfusion test performed today, both of which were normal.  Patient's EKG is unchanged. Troponin is negative. Case discussed with cardiology fellow, Doctor Colon Flattery. As the patient has had studies today that were normal, he felt that the patient could be discharged if a second troponin was negative. Followup with Doctor Gwenlyn Found in the office as soon as possible.  Case will be signed out to Doctor Reather Converse to followup in the troponin, discharge if negative.   Orpah Greek, MD 04/19/14 [phone]

## 2014-04-17 NOTE — Progress Notes (Signed)
2D Echocardiogram Complete.  04/17/2014   Tashianna Broome, RDCS 

## 2014-04-17 NOTE — Procedures (Addendum)
[facility] [address] [place] [address] [phone]  Cardiology Nuclear Med Study  Tracey Manning is a 53 y.o. female     MRN : [id number]     DOB: 1961-04-09  Procedure Date: 04/17/2014  Nuclear Med Background Indication for Stress Test:  Stent Patency History:  Asthma, COPD and CAD;MI-2004;STENT/PTCA-multiple;Last NUC MPI on 08/30/2009-nonischemic;EF=52% Cardiac Risk Factors: Hypertension, Lipids, Obesity and Smoker  Symptoms:  Chest Pain, Dizziness, DOE, Light-Headedness and Palpitations   Nuclear Pre-Procedure Caffeine/Decaff Intake:  7:00pm NPO After: 5:00am   IV Site: R Forearm  IV 0.9% NS with Angio Cath:  22g  Chest Size (in):  n/a IV Started by: Rolene Course, RN  Height: 5\' 1"  (1.549 m)  Cup Size: DDD  BMI:  Body mass index is 38.19 kg/(m^2). Weight:  202 lb (91.627 kg)   Tech Comments:  n/a    Nuclear Med Study 1 or 2 day study: 1 day  Stress Test Type:  [place] Provider:  Quay Burow, MD   Resting Radionuclide: Technetium 55m Sestamibi  Resting Radionuclide Dose: 10.8 mCi   Stress Radionuclide:  Technetium 58m Sestamibi  Stress Radionuclide Dose: 30.4 mCi           Stress Protocol Rest HR: 57 Stress HR: 88  Rest BP: 139/102 Stress BP: 170/96  Exercise Time (min): n/a METS: n/a          Dose of Adenosine (mg):  n/a Dose of Lexiscan: 0.4 mg  Dose of Atropine (mg): n/a Dose of Dobutamine: n/a mcg/kg/min (at max HR)  Stress Test Technologist: Mellody Memos, CCT Nuclear Technologist: Imagene Riches, CNMT   Rest Procedure:  Myocardial perfusion imaging was performed at rest 45 minutes following the intravenous administration of Technetium 26m Sestamibi. Stress Procedure:  The patient received IV Lexiscan 0.4 mg over 15-seconds.  Technetium 29m Sestamibi injected Iv at 30-seconds.  Patient experienced shortness of breath, head pain, chest tightness and was administered 75 mg  of Aminophylline IV at 5 minutes. There were no significant changes with Lexiscan.  Quantitative spect images were obtained after a 45 minute delay.  Transient Ischemic Dilatation (Normal <1.22):  1.36 Lung/Heart Ratio (Normal <0.45):  0.25 QGS EDV:  138 ml QGS ESV:  80 ml LV Ejection Fraction: 42%        Rest ECG: NSR - Normal EKG  Stress ECG: No significant change from baseline ECG  QPS Raw Data Images:  Normal; no motion artifact; normal heart/lung ratio. Stress Images:  Normal homogeneous uptake in all areas of the myocardium. Rest Images:  Normal homogeneous uptake in all areas of the myocardium. Subtraction (SDS):  No evidence of ischemia.  Impression Exercise Capacity:  Lexiscan with no exercise. BP Response:  Hypertensive blood pressure response. Clinical Symptoms:  Typical chest pain. ECG Impression:  No significant ST segment change suggestive of ischemia. Comparison with Prior Nuclear Study: No significant change from previous study  Overall Impression:  Normal stress nuclear study.  LV Wall Motion:  Moderate global decraease in LV function   [name], MD  04/17/2014 5:17 PM

## 2014-04-17 NOTE — ED Notes (Signed)
Pt to ED via [facility] with c/o mid to left chest pain and pain in left arm.  Pt had stress test earlier today and went home to take a nap.  St's she woke up to watch a TV program when she noticed she was having pain in her chest and left arm.  Pt also st's she felt short of breath.  Pt took NTG 1 SL and 1 ASA prior to calling EMS.  EMS also gave pt 1 NTG.   Pt st's pain in chest is a #8 on pain scale o/10.

## 2014-04-18 ENCOUNTER — Telehealth: Payer: Self-pay | Admitting: Internal Medicine

## 2014-04-18 LAB — TROPONIN I: Troponin I: <0.3 ng/mL (ref <0.30)

## 2014-04-18 NOTE — Discharge Instructions (Signed)
Call cardiology tomorrow. If you were given medicines take as directed.  If you are on coumadin or contraceptives realize their levels and effectiveness is altered by many different medicines.  If you have any reaction (rash, tongues swelling, other) to the medicines stop taking and see a physician.   Please follow up as directed and return to the ER or see a physician for new or worsening symptoms.  Thank you. Filed Vitals:   04/17/14 2146  BP: 132/86  Temp: 97.9 F (36.6 C)  TempSrc: Oral  Resp: 21  Height: 5\' 1"  (1.549 m)  Weight: 202 lb (91.627 kg)  SpO2: 100%

## 2014-04-18 NOTE — Telephone Encounter (Signed)
Patient presented to ED on 5/26 with chest pain.  Patient had negative serial enzymes with no acute ekg changes and was discharged to follow up with Cardiology

## 2014-04-19 ENCOUNTER — Ambulatory Visit (HOSPITAL_COMMUNITY): Payer: Medicaid Other

## 2014-04-19 ENCOUNTER — Encounter (HOSPITAL_COMMUNITY): Payer: Medicaid Other

## 2014-04-20 ENCOUNTER — Telehealth: Payer: Self-pay | Admitting: *Deleted

## 2014-04-20 NOTE — Telephone Encounter (Signed)
Message copied by Raiford Simmonds on Fri Apr 20, 2014 12:14 PM ------      Message from: [name]      Created: Wed Apr 18, 2014  4:32 PM       No ischemia. Low risk study. EF mod decreased on myoview but nl on 2D which I believe. ------

## 2014-04-20 NOTE — Telephone Encounter (Signed)
Message copied by Raiford Simmonds on Fri Apr 20, 2014 12:16 PM ------      Message from: [name]      Created: Wed Apr 18, 2014  4:39 PM       Call and tell normal ------

## 2014-04-20 NOTE — Telephone Encounter (Signed)
Spoke to patient. Result given . Verbalized understanding  

## 2014-04-24 ENCOUNTER — Telehealth: Payer: Self-pay | Admitting: Cardiovascular Disease

## 2014-04-27 NOTE — Telephone Encounter (Signed)
Closed encounter °

## 2014-05-03 ENCOUNTER — Ambulatory Visit: Payer: Medicaid Other | Admitting: Cardiovascular Disease

## 2014-05-22 ENCOUNTER — Ambulatory Visit: Payer: Medicaid Other | Admitting: Cardiovascular Disease

## 2014-06-07 NOTE — Telephone Encounter (Signed)
Encounter complete. 

## 2014-06-08 ENCOUNTER — Encounter: Payer: Self-pay | Admitting: Cardiovascular Disease

## 2014-06-08 ENCOUNTER — Ambulatory Visit (INDEPENDENT_AMBULATORY_CARE_PROVIDER_SITE_OTHER): Payer: Medicaid Other | Admitting: Cardiovascular Disease

## 2014-06-08 VITALS — BP 174/114 | HR 62 | Ht 61.0 in | Wt 203.9 lb

## 2014-06-08 DIAGNOSIS — E785 Hyperlipidemia, unspecified: Secondary | ICD-10-CM

## 2014-06-08 DIAGNOSIS — I10 Essential (primary) hypertension: Secondary | ICD-10-CM

## 2014-06-08 NOTE — Assessment & Plan Note (Signed)
Her blood pressure is elevated today despite being on antihypertensive medications because causing pain chronic pain

## 2014-06-08 NOTE — Patient Instructions (Signed)
Your physician recommends that you schedule a follow-up appointment in: ONE YEAR with Dr. Berry.  

## 2014-06-08 NOTE — Assessment & Plan Note (Signed)
On statin therapy followed by her PCP 

## 2014-06-08 NOTE — Progress Notes (Signed)
06/08/2014 [name]   May 25, 1961  [id number]  Primary Physician No PCP Per Patient Primary Cardiologist: [name] MD Renae Gloss   HPI:  Ms. Veldman is a 53 year old moderately overweight single African American female mother of 2 children, grandmother and 2 grandchildren who currently is out of work on [employer] and disability. She was referred by Dr. Shawna Orleans for cardiovascular evaluation because of chest pain. Her cardiac risk factor profile is positive for 20-pack-years of tobacco abuse currently smoking one pack per day, treated type of pressure and high cholesterol. She has had multiple heart attacks in the past dating back to 2004. She had a stent placed in her circumflex by Dr. Einar Gip December 2008 (Taxus 2.5 x 24 mm). She had a Medtronic stent placed in her proximal LAD by Dr. Claiborne Billings 06/11/11. Over the last 6 months she has developed recurrent chest pain as well as dyspnea on exertion. Since I saw her on 04/04/14 I obtained a Myoview stress test which was normal and a 2-D echo which revealed normal LV function. She does have diffuse somatic complaints as well as depression. I'm not convinced that her symptoms are cardiac in nature. She does continue to smoke one half pack per day.    Current Outpatient Prescriptions  Medication Sig Dispense Refill  . acyclovir (ZOVIRAX) 400 MG tablet Take 400 mg by mouth daily.      Marland Kitchen albuterol (PROVENTIL HFA;VENTOLIN HFA) 108 (90 BASE) MCG/ACT inhaler Inhale 2 puffs into the lungs every 6 (six) hours as needed for wheezing or shortness of breath.      Marland Kitchen amLODipine (NORVASC) 5 MG tablet Take 10 mg by mouth daily.      Marland Kitchen aspirin EC 325 MG tablet Take 325 mg by mouth daily.      Marland Kitchen atorvastatin (LIPITOR) 40 MG tablet Take 1 tablet (40 mg total) by mouth daily.  30 tablet  2  . clopidogrel (PLAVIX) 75 MG tablet Take 1 tablet (75 mg total) by mouth daily with breakfast.  30 tablet  2  . fluticasone (FLOVENT HFA) 220 MCG/ACT  inhaler Inhale 1 puff into the lungs 2 (two) times daily.      . fluticasone-salmeterol (ADVAIR HFA) 115-21 MCG/ACT inhaler Inhale 2 puffs into the lungs 2 (two) times daily.  1 Inhaler  12  . lisinopril-hydrochlorothiazide (PRINZIDE,ZESTORETIC) 20-12.5 MG per tablet Take 2 tablets by mouth daily.      . nitroGLYCERIN (NITROSTAT) 0.4 MG SL tablet Place 0.4 mg under the tongue every 5 (five) minutes as needed for chest pain.      Marland Kitchen zolpidem (AMBIEN) 10 MG tablet Take 10 mg by mouth at bedtime as needed for sleep.       No current facility-administered medications for this visit.    Allergies  Allergen Reactions  . Other Anaphylaxis and Hives    Fresh strawberries (throat swelled)  . Dilaudid [Hydromorphone Hcl] Nausea And Vomiting  . Latex Swelling    No reaction with bandaids    History   Social History  . Marital Status: Single    Spouse Name: N/A    Number of Children: 2  . Years of Education: 12   Occupational History  . Not on file.   Social History Main Topics  . Smoking status: Current Every Day Smoker -- 0.50 packs/day    Types: Cigarettes  . Smokeless tobacco: Not on file  . Alcohol Use: No  . Drug Use: Yes    Special: Marijuana  .  Sexual Activity: Not on file   Other Topics Concern  . Not on file   Social History Narrative  . No narrative on file     Review of Systems: General: negative for chills, fever, night sweats or weight changes.  Cardiovascular: negative for chest pain, dyspnea on exertion, edema, orthopnea, palpitations, paroxysmal nocturnal dyspnea or shortness of breath Dermatological: negative for rash Respiratory: negative for cough or wheezing Urologic: negative for hematuria Abdominal: negative for nausea, vomiting, diarrhea, bright red blood per rectum, melena, or hematemesis Neurologic: negative for visual changes, syncope, or dizziness All other systems reviewed and are otherwise negative except as noted above.    Blood pressure  174/114, pulse 62, height 5\' 1"  (1.549 m), weight 203 lb 14.4 oz (92.488 kg), last menstrual period 12/19/2012.  General appearance: alert and no distress Neck: no adenopathy, no carotid bruit, no JVD, supple, symmetrical, trachea midline and thyroid not enlarged, symmetric, no tenderness/mass/nodules Lungs: clear to auscultation bilaterally Heart: regular rate and rhythm, S1, S2 normal, no murmur, click, rub or gallop Extremities: extremities normal, atraumatic, no cyanosis or edema  EKG not performed today  ASSESSMENT AND PLAN:   Hyperlipidemia On statin therapy followed by her PCP  Essential hypertension Her blood pressure is elevated today despite being on antihypertensive medications because causing pain chronic pain  Coronary artery disease The patient really had a Myoview stress test which was entirely normal as was a 2-D echo revealed normal LV function. I don't think her pain is cardiac. She does have diffuse somatic complaints as well as depression.      [name] MD FACP,FACC,FAHA, [facility] 06/08/2014 11:16 AM

## 2014-06-08 NOTE — Assessment & Plan Note (Signed)
The patient really had a Myoview stress test which was entirely normal as was a 2-D echo revealed normal LV function. I don't think her pain is cardiac. She does have diffuse somatic complaints as well as depression.

## 2014-06-19 ENCOUNTER — Ambulatory Visit (INDEPENDENT_AMBULATORY_CARE_PROVIDER_SITE_OTHER): Payer: Medicaid Other | Admitting: Obstetrics

## 2014-06-19 ENCOUNTER — Encounter: Payer: Self-pay | Admitting: Obstetrics

## 2014-06-19 VITALS — BP 177/100 | HR 65 | Temp 97.9°F | Wt 203.0 lb

## 2014-06-19 DIAGNOSIS — Z0000 Encounter for general adult medical examination without abnormal findings: Secondary | ICD-10-CM

## 2014-06-19 NOTE — Progress Notes (Signed)
Subjective:     Tracey Manning is a 53 y.o. female here for a routine exam.  Current complaints: None.    Personal health questionnaire:  Is patient [name], have a family history of breast and/or ovarian cancer: no Is there a family history of uterine cancer diagnosed at age < 22, gastrointestinal cancer, urinary tract cancer, family member who is a Field seismologist syndrome-associated carrier: no Is the patient overweight and hypertensive, family history of diabetes, personal history of gestational diabetes or PCOS: no Is patient over 27, have PCOS,  family history of premature CHD under age 67, diabetes, smoke, have hypertension or peripheral artery disease:  no At any time, has a partner hit, kicked or otherwise hurt or frightened you?: no Over the past 2 weeks, have you felt down, depressed or hopeless?: no Over the past 2 weeks, have you felt little interest or pleasure in doing things?:no   Gynecologic History Patient's last menstrual period was 12/19/2012. Contraception: none Last Pap: 2014. Results were: normal Last mammogram: 2014. Results were: normal  Obstetric History OB History  No data available    Past Medical History  Diagnosis Date  . Coronary artery disease   . Sciatica   . Hypertension   . Myocardial infarct   . Asthma   . Dyslipidemia   . Tobacco abuse   . Chest pain     Past Surgical History  Procedure Laterality Date  . Cesarean section    . Dilation and curettage, diagnostic / therapeutic    . Nm myocar perf wall motion  08/2009    persantine myoview - normal perfusion in all regions, perfusion defect in anterior region (breast attenuation), EF 52%, low risk scan  . Cardiac catheterization  10/09/2003    [place], [place]) - LAD with 30% prox narrowing, 50% stenosis in mid-portion of PLA; RCA with 40% narrowing proximally ([name], [name])  . Cardiac catheterization  10/10/2007    70% stenosis in first septal perforator branch  of LAD, 60-70% narrowing in mid LAD, 20% narrowing in mid AV groove Cfx with 80% diffuse narrowing in small distal marginal, total occlusion of mid RCA with L to R collaterals, 90% stenosis diffusely in prox branch of RCA followed by 70% stenosis in secondary curve & 80% stenosis in small marginal branch ([name])  . Coronary angioplasty with stent placement  10/31/2007    PCI of distal Cfx with 2.25x43mm Taxus Adam DES, 60% narrowing of mid LAD ([name])  . Cardiac catheterization  05/28/2008    normal L main, RCA with 100% prox lesion w/distal filling from LAD collaterals, LAD with 20% prox tubular lesion/ 40% mid LAD lesion/previous stent patent ([name])  . Cardiac catheterization  09/15/2009    discrete 100% osital RCA lesion, 50% prox LAD lesion, non-obstructive disease in all coronaries ([name])  . Coronary angioplasty with stent placement  06/11/2011    PCI of prox-mid LAD with 3x25mm DES Resolute ([name])    Current outpatient prescriptions:acyclovir (ZOVIRAX) 400 MG tablet, Take 400 mg by mouth daily., Disp: , Rfl: ;  albuterol (PROVENTIL HFA;VENTOLIN HFA) 108 (90 BASE) MCG/ACT inhaler, Inhale 2 puffs into the lungs every 6 (six) hours as needed for wheezing or shortness of breath., Disp: , Rfl: ;  amLODipine (NORVASC) 5 MG tablet, Take 10 mg by mouth daily., Disp: , Rfl: ;  aspirin EC 325 MG tablet, Take 325 mg by mouth daily., Disp: , Rfl:  atorvastatin (LIPITOR) 40 MG tablet, Take 1 tablet (40 mg total) by mouth daily., Disp: 30 tablet, Rfl: 2;  clopidogrel (PLAVIX) 75 MG tablet, Take 1 tablet (75 mg total) by mouth daily with breakfast., Disp: 30 tablet, Rfl: 2;  fluticasone (FLOVENT HFA) 220 MCG/ACT inhaler, Inhale 1 puff into the lungs 2 (two) times daily., Disp: , Rfl:  fluticasone-salmeterol (ADVAIR HFA) 115-21 MCG/ACT inhaler, Inhale 2 puffs into the lungs 2 (two) times daily., Disp: 1 Inhaler, Rfl: 12;  lisinopril-hydrochlorothiazide (PRINZIDE,ZESTORETIC)  20-12.5 MG per tablet, Take 2 tablets by mouth daily., Disp: , Rfl: ;  nitroGLYCERIN (NITROSTAT) 0.4 MG SL tablet, Place 0.4 mg under the tongue every 5 (five) minutes as needed for chest pain., Disp: , Rfl:  zolpidem (AMBIEN) 10 MG tablet, Take 10 mg by mouth at bedtime as needed for sleep., Disp: , Rfl:  Allergies  Allergen Reactions  . Other Anaphylaxis and Hives    Fresh strawberries (throat swelled)  . Dilaudid [Hydromorphone Hcl] Nausea And Vomiting  . Latex Swelling    No reaction with bandaids    History  Substance Use Topics  . Smoking status: Current Every Day Smoker -- 0.50 packs/day    Types: Cigarettes  . Smokeless tobacco: Not on file  . Alcohol Use: No    Family History  Problem Relation Age of Onset  . Leukemia Mother   . Clotting disorder Father     blood clot  . Hypertension Sister   . Stroke Sister 33  . Bleeding Disorder Son     ITP "free bleeding disorder"      Review of Systems  Constitutional: negative for fatigue and weight loss Respiratory: negative for cough and wheezing Cardiovascular: negative for chest pain, fatigue and palpitations Gastrointestinal: negative for abdominal pain and change in bowel habits Musculoskeletal:negative for myalgias Neurological: negative for gait problems and tremors Behavioral/Psych: negative for abusive relationship, depression Endocrine: negative for temperature intolerance   Genitourinary:negative for abnormal menstrual periods, genital lesions, hot flashes, sexual problems and vaginal discharge Integument/breast: negative for breast lump, breast tenderness, nipple discharge and skin lesion(s)    Objective:       BP 177/100  Pulse 65  Temp(Src) 97.9 F (36.6 C)  Wt 203 lb (92.08 kg)  LMP 12/19/2012 General:   alert  Skin:   no rash or abnormalities  Lungs:   clear to auscultation bilaterally  Heart:   regular rate and rhythm, S1, S2 normal, no murmur, click, rub or gallop  Breasts:   normal without  suspicious masses, skin or nipple changes or axillary nodes  Abdomen:  normal findings: no organomegaly, soft, non-tender and no hernia  Pelvis:  External genitalia: normal general appearance Urinary system: urethral meatus normal and bladder without fullness, nontender Vaginal: normal without tenderness, induration or masses Cervix: normal appearance Adnexa: normal bimanual exam Uterus: anteverted and non-tender, normal size   Lab Review Urine pregnancy test Labs reviewed yes Radiologic studies reviewed yes    Assessment:    Healthy female exam.    Plan:    Education reviewed: calcium supplements, low fat, low cholesterol diet, self breast exams, smoking cessation and weight bearing exercise. Follow up in: 1 year.   No orders of the defined types were placed in this encounter.   No orders of the defined types were placed in this encounter.

## 2014-06-19 NOTE — Addendum Note (Signed)
Addended by: Lewie Loron D on: 06/19/2014 04:47 PM   Modules accepted: Orders

## 2014-06-20 LAB — WET PREP BY MOLECULAR PROBE
Candida species: NEGATIVE
GARDNERELLA VAGINALIS: POSITIVE — AB
TRICHOMONAS VAG: NEGATIVE

## 2014-06-20 LAB — PAP IG AND HPV HIGH-RISK: HPV DNA HIGH RISK: NOT DETECTED

## 2014-06-20 LAB — GC/CHLAMYDIA PROBE AMP
CT PROBE, AMP APTIMA: NEGATIVE
GC PROBE AMP APTIMA: NEGATIVE

## 2014-06-21 ENCOUNTER — Other Ambulatory Visit: Payer: Self-pay | Admitting: *Deleted

## 2014-06-21 MED ORDER — TINIDAZOLE 500 MG PO TABS: 1.0000 g | ORAL_TABLET | Freq: Every day | ORAL | Status: DC

## 2014-06-21 NOTE — Progress Notes (Signed)
Pt was made aware of lab result and Tinidazole sent to pharmacy.

## 2014-07-17 ENCOUNTER — Ambulatory Visit (HOSPITAL_BASED_OUTPATIENT_CLINIC_OR_DEPARTMENT_OTHER): Payer: Medicaid Other | Attending: Internal Medicine | Admitting: Radiology

## 2014-07-17 VITALS — Ht 61.0 in | Wt 206.0 lb

## 2014-07-17 DIAGNOSIS — G4733 Obstructive sleep apnea (adult) (pediatric): Secondary | ICD-10-CM | POA: Insufficient documentation

## 2014-07-21 DIAGNOSIS — G4733 Obstructive sleep apnea (adult) (pediatric): Secondary | ICD-10-CM

## 2014-07-21 NOTE — Sleep Study (Signed)
   NAME: Tracey Manning DATE OF BIRTH:  May 31, 1961 MEDICAL RECORD NUMBER [id number]  LOCATION: [facility]  PHYSICIAN: Sierra Bissonette D  DATE OF STUDY: 07/17/2014  SLEEP STUDY TYPE: Nocturnal Polysomnogram               REFERRING PHYSICIAN: [name], MD  INDICATION FOR STUDY: Hypersomnia with sleep apnea  EPWORTH SLEEPINESS SCORE:   8/24 HEIGHT: 5\' 1"  (154.9 cm)  WEIGHT: 206 lb (93.441 kg)    Body mass index is 38.94 kg/(m^2).  NECK SIZE: 16 in.  MEDICATIONS: Charted for review  SLEEP ARCHITECTURE: Total sleep time 271.5 minutes with sleep efficiency 73.5%. Stage I was 8.1%, stage II 76.2%, stage [name] 0.9%, REM 14.7% of total sleep time. Sleep latency 6 minutes, REM latency 52.5 minutes, awake after sleep onset 92 minutes, arousal index 29.6. Bedtime medication: None  RESPIRATORY DATA: Apnea hypopneas index (AHI) 8 per hour. 36 total events scored including 4 obstructive apneas, 32 hypopneas. Most events were while nonsupine. REM AHI 45 per hour. There were not enough early events to permit application of split protocol CPAP titration.  OXYGEN DATA: Moderately loud snoring with oxygen desaturation to a nadir of 87% and mean saturation of 95.9% on room air  CARDIAC DATA: Normal sinus rhythm with PVCs and PACs  MOVEMENT/PARASOMNIA: 116 total limb jerks counted of which 5 were associated with arousal or awakening for periodic limb movement with arousal index of 1.1 per hour. Bathroom x2  IMPRESSION/ RECOMMENDATION:   1) Mild obstructive sleep apnea/hypopneas syndrome, AHI 8 per hour with most events while nonsupine and while in REM. Moderately loud snoring with oxygen desaturation to a nadir of 87% and mean saturation 95.9% on room air. 2) There were not enough events to permit application of split protocol CPAP titration on this study. If conservative measures are insufficient, CPAP may be an option, depending on clinical judgment.   Deneise Lever Diplomate, American Board of Sleep Medicine  ELECTRONICALLY SIGNED ON:  07/21/2014, 10:35 AM [place] PH: [phone]   FX: [phone] [place]

## 2014-11-30 ENCOUNTER — Encounter (HOSPITAL_COMMUNITY): Payer: Self-pay | Admitting: Emergency Medicine

## 2014-11-30 ENCOUNTER — Emergency Department (HOSPITAL_COMMUNITY)
Admission: EM | Admit: 2014-11-30 | Discharge: 2014-12-01 | Disposition: A | Discharge status: Home / Self Care | Payer: Medicaid Other | Attending: Emergency Medicine | Admitting: Emergency Medicine

## 2014-11-30 DIAGNOSIS — Z91040 Latex allergy status: Secondary | ICD-10-CM | POA: Insufficient documentation

## 2014-11-30 DIAGNOSIS — R51 Headache: Secondary | ICD-10-CM | POA: Insufficient documentation

## 2014-11-30 DIAGNOSIS — I10 Essential (primary) hypertension: Secondary | ICD-10-CM | POA: Diagnosis not present

## 2014-11-30 DIAGNOSIS — R519 Headache, unspecified: Secondary | ICD-10-CM

## 2014-11-30 DIAGNOSIS — Z720 Tobacco use: Secondary | ICD-10-CM | POA: Insufficient documentation

## 2014-11-30 DIAGNOSIS — Z79899 Other long term (current) drug therapy: Secondary | ICD-10-CM | POA: Insufficient documentation

## 2014-11-30 DIAGNOSIS — R110 Nausea: Secondary | ICD-10-CM | POA: Diagnosis not present

## 2014-11-30 DIAGNOSIS — Z7982 Long term (current) use of aspirin: Secondary | ICD-10-CM | POA: Insufficient documentation

## 2014-11-30 DIAGNOSIS — Z7902 Long term (current) use of antithrombotics/antiplatelets: Secondary | ICD-10-CM | POA: Insufficient documentation

## 2014-11-30 DIAGNOSIS — E785 Hyperlipidemia, unspecified: Secondary | ICD-10-CM | POA: Diagnosis not present

## 2014-11-30 DIAGNOSIS — J45909 Unspecified asthma, uncomplicated: Secondary | ICD-10-CM | POA: Diagnosis not present

## 2014-11-30 DIAGNOSIS — Z9889 Other specified postprocedural states: Secondary | ICD-10-CM | POA: Insufficient documentation

## 2014-11-30 DIAGNOSIS — I2510 Atherosclerotic heart disease of native coronary artery without angina pectoris: Secondary | ICD-10-CM | POA: Insufficient documentation

## 2014-11-30 DIAGNOSIS — Z7951 Long term (current) use of inhaled steroids: Secondary | ICD-10-CM | POA: Diagnosis not present

## 2014-11-30 DIAGNOSIS — I252 Old myocardial infarction: Secondary | ICD-10-CM | POA: Insufficient documentation

## 2014-11-30 DIAGNOSIS — Z955 Presence of coronary angioplasty implant and graft: Secondary | ICD-10-CM | POA: Insufficient documentation

## 2014-11-30 LAB — CBC WITH DIFFERENTIAL/PLATELET
BASOS ABS: 0 10*3/uL (ref 0.0–0.1)
BASOS PCT: 0 % (ref 0–1)
Eosinophils Absolute: 0.1 10*3/uL (ref 0.0–0.7)
Eosinophils Relative: 2 % (ref 0–5)
HCT: 41.8 % (ref 36.0–46.0)
Hemoglobin: 14.5 g/dL (ref 12.0–15.0)
Lymphocytes Relative: 26 % (ref 12–46)
Lymphs Abs: 1.8 10*3/uL (ref 0.7–4.0)
MCH: 29.1 pg (ref 26.0–34.0)
MCHC: 34.7 g/dL (ref 30.0–36.0)
MCV: 83.9 fL (ref 78.0–100.0)
MONO ABS: 0.3 10*3/uL (ref 0.1–1.0)
Monocytes Relative: 5 % (ref 3–12)
Neutro Abs: 4.6 10*3/uL (ref 1.7–7.7)
Neutrophils Relative %: 67 % (ref 43–77)
Platelets: 215 10*3/uL (ref 150–400)
RBC: 4.98 MIL/uL (ref 3.87–5.11)
RDW: 15.3 % (ref 11.5–15.5)
WBC: 6.9 10*3/uL (ref 4.0–10.5)

## 2014-11-30 LAB — COMPREHENSIVE METABOLIC PANEL
ALK PHOS: 77 U/L (ref 39–117)
ALT: 19 U/L (ref 0–35)
AST: 20 U/L (ref 0–37)
Albumin: 4 g/dL (ref 3.5–5.2)
Anion gap: 6 (ref 5–15)
BILIRUBIN TOTAL: 0.4 mg/dL (ref 0.3–1.2)
BUN: 17 mg/dL (ref 6–23)
CALCIUM: 9.5 mg/dL (ref 8.4–10.5)
CO2: 31 mmol/L (ref 19–32)
Chloride: 99 mEq/L (ref 96–112)
Creatinine, Ser: 1.05 mg/dL (ref 0.50–1.10)
GFR calc Af Amer: 69 mL/min — ABNORMAL LOW (ref >90)
GFR calc non Af Amer: 60 mL/min — ABNORMAL LOW (ref >90)
Glucose, Bld: 136 mg/dL — ABNORMAL HIGH (ref 70–99)
Potassium: 3.4 mmol/L — ABNORMAL LOW (ref 3.5–5.1)
SODIUM: 136 mmol/L (ref 135–145)
TOTAL PROTEIN: 7.2 g/dL (ref 6.0–8.3)

## 2014-11-30 MED ORDER — KETOROLAC TROMETHAMINE 30 MG/ML IJ SOLN
30.0000 mg | Freq: Once | INTRAMUSCULAR | Status: AC
  Administered 2014-11-30: 30 mg via INTRAVENOUS
  Filled 2014-11-30: qty 1

## 2014-11-30 MED ORDER — SODIUM CHLORIDE 0.9 % IV BOLUS (SEPSIS)
1000.0000 mL | Freq: Once | INTRAVENOUS | Status: AC
  Administered 2014-11-30: 1000 mL via INTRAVENOUS

## 2014-11-30 MED ORDER — DIPHENHYDRAMINE HCL 50 MG/ML IJ SOLN
25.0000 mg | Freq: Once | INTRAMUSCULAR | Status: AC
  Administered 2014-11-30: 25 mg via INTRAVENOUS
  Filled 2014-11-30: qty 1

## 2014-11-30 MED ORDER — METOCLOPRAMIDE HCL 5 MG/ML IJ SOLN
10.0000 mg | INTRAMUSCULAR | Status: AC
  Administered 2014-11-30: 10 mg via INTRAVENOUS
  Filled 2014-11-30: qty 2

## 2014-11-30 NOTE — ED Notes (Signed)
Pt. reports persistent headache onset this afternoon with mild nausea , denies head injury , denies fever or chills, hypertensive at triage .

## 2014-11-30 NOTE — ED Provider Notes (Signed)
CSN: [id number]     Arrival date & time 11/30/14  1926 History   First MD Initiated Contact with Patient 11/30/14 2239     Chief Complaint  Patient presents with  . Headache     (Consider location/radiation/quality/duration/timing/severity/associated sxs/prior Treatment) HPI Comments: Patient is a 54 year old female with a past medical history of migraines, hypertension, and CAD who presents with a headache that started a few hours prior to arrival. Patient reports a gradual onset and progressive worsening of the headache. The pain is sharp, constant and is located in left head without radiation. Patient has tried nothing for symptoms without relief. No alleviating/aggravating factors. Patient reports associated nausea. Patient denies fever, vomiting, photophobia, diarrhea, numbness/tingling, weakness, visual changes, congestion, chest pain, SOB, abdominal pain. Patient reports this feels like her normal migraines which generally improve with migraine cocktail. Patient denies head injury or trauma. Patient takes Plavix.      Past Medical History  Diagnosis Date  . Coronary artery disease   . Sciatica   . Hypertension   . Myocardial infarct   . Asthma   . Dyslipidemia   . Tobacco abuse   . Chest pain    Past Surgical History  Procedure Laterality Date  . Cesarean section    . Dilation and curettage, diagnostic / therapeutic    . Nm myocar perf wall motion  08/2009    persantine myoview - normal perfusion in all regions, perfusion defect in anterior region (breast attenuation), EF 52%, low risk scan  . Cardiac catheterization  10/09/2003    [place], [place]) - LAD with 30% prox narrowing, 50% stenosis in mid-portion of PLA; RCA with 40% narrowing proximally ([name], [name])  . Cardiac catheterization  10/10/2007    70% stenosis in first septal perforator branch of LAD, 60-70% narrowing in mid LAD, 20% narrowing in mid AV groove Cfx with 80% diffuse narrowing in  small distal marginal, total occlusion of mid RCA with L to R collaterals, 90% stenosis diffusely in prox branch of RCA followed by 70% stenosis in secondary curve & 80% stenosis in small marginal branch ([name])  . Coronary angioplasty with stent placement  10/31/2007    PCI of distal Cfx with 2.25x25mm Taxus Adam DES, 60% narrowing of mid LAD ([name])  . Cardiac catheterization  05/28/2008    normal L main, RCA with 100% prox lesion w/distal filling from LAD collaterals, LAD with 20% prox tubular lesion/ 40% mid LAD lesion/previous stent patent ([name])  . Cardiac catheterization  09/15/2009    discrete 100% osital RCA lesion, 50% prox LAD lesion, non-obstructive disease in all coronaries ([name])  . Coronary angioplasty with stent placement  06/11/2011    PCI of prox-mid LAD with 3x62mm DES Resolute ([name])   Family History  Problem Relation Age of Onset  . Leukemia Mother   . Clotting disorder Father     blood clot  . Hypertension Sister   . Stroke Sister 64  . Bleeding Disorder Son     ITP "free bleeding disorder"   History  Substance Use Topics  . Smoking status: Current Every Day Smoker -- 0.50 packs/day    Types: Cigarettes  . Smokeless tobacco: Not on file  . Alcohol Use: No   OB History    No data available     Review of Systems  Constitutional: Negative for fever, chills and fatigue.  HENT: Negative for trouble swallowing.  Eyes: Negative for visual disturbance.  Respiratory: Negative for shortness of breath.   Cardiovascular: Negative for chest pain and palpitations.  Gastrointestinal: Positive for nausea. Negative for vomiting, abdominal pain and diarrhea.  Genitourinary: Negative for dysuria and difficulty urinating.  Musculoskeletal: Negative for arthralgias and neck pain.  Skin: Negative for color change.  Neurological: Positive for headaches. Negative for dizziness and weakness.  Psychiatric/Behavioral: Negative for dysphoric  mood.      Allergies  Other; Dilaudid; and Latex  Home Medications   Prior to Admission medications   Medication Sig Start Date End Date Taking? Authorizing Provider  acyclovir (ZOVIRAX) 400 MG tablet Take 400 mg by mouth daily.    Historical Provider, MD  albuterol (PROVENTIL HFA;VENTOLIN HFA) 108 (90 BASE) MCG/ACT inhaler Inhale 2 puffs into the lungs every 6 (six) hours as needed for wheezing or shortness of breath.    Historical Provider, MD  amLODipine (NORVASC) 5 MG tablet Take 10 mg by mouth daily.    Historical Provider, MD  aspirin EC 325 MG tablet Take 325 mg by mouth daily.    Historical Provider, MD  atorvastatin (LIPITOR) 40 MG tablet Take 1 tablet (40 mg total) by mouth daily. 01/04/14   [name], MD  clopidogrel (PLAVIX) 75 MG tablet Take 1 tablet (75 mg total) by mouth daily with breakfast. 01/04/14   [name], MD  fluticasone (FLOVENT HFA) 220 MCG/ACT inhaler Inhale 1 puff into the lungs 2 (two) times daily.    Historical Provider, MD  fluticasone-salmeterol (ADVAIR HFA) 115-21 MCG/ACT inhaler Inhale 2 puffs into the lungs 2 (two) times daily. 01/04/14   [name], MD  lisinopril-hydrochlorothiazide (PRINZIDE,ZESTORETIC) 20-12.5 MG per tablet Take 2 tablets by mouth daily.    Historical Provider, MD  nitroGLYCERIN (NITROSTAT) 0.4 MG SL tablet Place 0.4 mg under the tongue every 5 (five) minutes as needed for chest pain.    Historical Provider, MD  tinidazole (TINDAMAX) 500 MG tablet Take 2 tablets (1,000 mg total) by mouth daily with breakfast. For 5 days 06/21/14   [name], MD  zolpidem (AMBIEN) 10 MG tablet Take 10 mg by mouth at bedtime as needed for sleep.    Historical Provider, MD   BP 185/120 mmHg  Pulse 88  Temp(Src) 98.3 F (36.8 C)  Resp 18  SpO2 99%  LMP 12/19/2012 Physical Exam  Constitutional: She is oriented to person, place, and time. She appears well-developed and well-nourished. No distress.  HENT:  Head: Normocephalic and  atraumatic.  Mouth/Throat: Oropharynx is clear and moist. No oropharyngeal exudate.  Eyes: Conjunctivae and EOM are normal. Pupils are equal, round, and reactive to light.  Neck: Normal range of motion.  Cardiovascular: Normal rate and regular rhythm.  Exam reveals no gallop and no friction rub.   No murmur heard. Pulmonary/Chest: Effort normal and breath sounds normal. She has no wheezes. She has no rales. She exhibits no tenderness.  Abdominal: Soft. She exhibits no distension. There is no tenderness. There is no rebound and no guarding.  Musculoskeletal: Normal range of motion.  Neurological: She is alert and oriented to person, place, and time. No cranial nerve deficit. Coordination normal.  Extremity strength and sensation equal and intact bilaterally. Speech is goal-oriented. Moves limbs without ataxia.   Skin: Skin is warm and dry.  Psychiatric: She has a normal mood and affect. Her behavior is normal.  Nursing note and vitals reviewed.   ED Course  Procedures (including critical care time) Labs Review Labs  Reviewed  COMPREHENSIVE METABOLIC PANEL - Abnormal; Notable for the following:    Potassium 3.4 (*)    Glucose, Bld 136 (*)    GFR calc non Af Amer 60 (*)    GFR calc Af Amer 69 (*)    All other components within normal limits  CBC WITH DIFFERENTIAL    Imaging Review Ct Head Wo Contrast  12/01/2014   CLINICAL DATA:  Headache since yesterday afternoon in the left frontal region and left eye.  EXAM: CT HEAD WITHOUT CONTRAST  TECHNIQUE: Contiguous axial images were obtained from the base of the skull through the vertex without intravenous contrast.  COMPARISON:  01/06/2009  FINDINGS: Ventricles and sulci appear symmetrical. No mass effect or midline shift. No abnormal extra-axial fluid collections. Gray-white matter junctions are distinct. Basal cisterns are not effaced. No evidence of acute intracranial hemorrhage. No depressed skull fractures. Visualized paranasal sinuses and  mastoid air cells are not opacified.  IMPRESSION: No acute intracranial abnormalities.  Normal examination.   Electronically Signed   By: Lucienne Capers M.D.   On: 12/01/2014 01:40     EKG Interpretation None      MDM   Final diagnoses:  Headache    11:18 PM Labs unremarkable for acute changes. Patient does not appear in distress on physical exam and interview. Patient has no neuro deficits. Patient has experienced these migraines in the past and reports improvement with "a cocktail." Patient will have fluids, toradol, reglan, benadryl.   2:21 AM CT head unremarkable for acute changes. Patient reports improvement of her symptoms. Patient will be discharged with instructions to return with worsening or concerning symptoms.    Alvina Chou, PA-C 12/01/14 0225  Mariea Clonts, MD 12/01/14 [phone]

## 2014-12-01 ENCOUNTER — Emergency Department (HOSPITAL_COMMUNITY): Payer: Medicaid Other

## 2014-12-01 NOTE — Discharge Instructions (Signed)
Refer to attached documents for more information. Return to the ED with worsening or concerning symptoms.

## 2015-01-07 ENCOUNTER — Telehealth: Payer: Self-pay | Admitting: Cardiovascular Disease

## 2015-01-07 NOTE — Telephone Encounter (Signed)
Tracey Manning is calling because she is needing clearance to have the dentist put her to sleep for cleaning and restoration  ,Has severe gag reflex. Also need to know how many days she need to stop her Plavix . Please call  the information to Phone # [phone].  Thanks

## 2015-01-08 NOTE — Telephone Encounter (Signed)
Okay to have dental procedure. Can be off Plavix for 1 week prior to procedure.

## 2015-01-14 NOTE — Telephone Encounter (Signed)
Letter faxed to [facility] giving clearance per Dr. Gwenlyn Found.

## 2015-02-04 ENCOUNTER — Emergency Department (HOSPITAL_COMMUNITY): Payer: Medicaid Other

## 2015-02-04 ENCOUNTER — Ambulatory Visit (HOSPITAL_COMMUNITY)
Admission: RE | Admit: 2015-02-04 | Discharge: 2015-02-04 | Disposition: A | Discharge status: Home / Self Care | Payer: Medicaid Other | Source: Ambulatory Visit | Attending: Internal Medicine | Admitting: Internal Medicine

## 2015-02-04 ENCOUNTER — Encounter (HOSPITAL_COMMUNITY): Payer: Self-pay | Admitting: Emergency Medicine

## 2015-02-04 ENCOUNTER — Other Ambulatory Visit (HOSPITAL_COMMUNITY): Payer: Self-pay | Admitting: Internal Medicine

## 2015-02-04 ENCOUNTER — Ambulatory Visit: Payer: Medicaid Other

## 2015-02-04 ENCOUNTER — Emergency Department (HOSPITAL_COMMUNITY)
Admission: EM | Admit: 2015-02-04 | Discharge: 2015-02-04 | Disposition: A | Discharge status: Home / Self Care | Payer: Medicaid Other | Attending: Emergency Medicine | Admitting: Emergency Medicine

## 2015-02-04 ENCOUNTER — Other Ambulatory Visit: Payer: Self-pay

## 2015-02-04 DIAGNOSIS — R52 Pain, unspecified: Secondary | ICD-10-CM

## 2015-02-04 DIAGNOSIS — M5430 Sciatica, unspecified side: Secondary | ICD-10-CM | POA: Insufficient documentation

## 2015-02-04 DIAGNOSIS — R51 Headache: Secondary | ICD-10-CM | POA: Diagnosis not present

## 2015-02-04 DIAGNOSIS — N8320 Unspecified ovarian cysts: Secondary | ICD-10-CM | POA: Insufficient documentation

## 2015-02-04 DIAGNOSIS — I252 Old myocardial infarction: Secondary | ICD-10-CM | POA: Insufficient documentation

## 2015-02-04 DIAGNOSIS — H53149 Visual discomfort, unspecified: Secondary | ICD-10-CM | POA: Insufficient documentation

## 2015-02-04 DIAGNOSIS — R109 Unspecified abdominal pain: Secondary | ICD-10-CM

## 2015-02-04 DIAGNOSIS — Z9861 Coronary angioplasty status: Secondary | ICD-10-CM | POA: Diagnosis not present

## 2015-02-04 DIAGNOSIS — N83299 Other ovarian cyst, unspecified side: Secondary | ICD-10-CM

## 2015-02-04 DIAGNOSIS — Z79899 Other long term (current) drug therapy: Secondary | ICD-10-CM | POA: Diagnosis not present

## 2015-02-04 DIAGNOSIS — Z91040 Latex allergy status: Secondary | ICD-10-CM | POA: Insufficient documentation

## 2015-02-04 DIAGNOSIS — I10 Essential (primary) hypertension: Secondary | ICD-10-CM | POA: Diagnosis not present

## 2015-02-04 DIAGNOSIS — R1032 Left lower quadrant pain: Secondary | ICD-10-CM | POA: Insufficient documentation

## 2015-02-04 DIAGNOSIS — Z7902 Long term (current) use of antithrombotics/antiplatelets: Secondary | ICD-10-CM | POA: Insufficient documentation

## 2015-02-04 DIAGNOSIS — Z3202 Encounter for pregnancy test, result negative: Secondary | ICD-10-CM | POA: Diagnosis not present

## 2015-02-04 DIAGNOSIS — I2510 Atherosclerotic heart disease of native coronary artery without angina pectoris: Secondary | ICD-10-CM | POA: Insufficient documentation

## 2015-02-04 DIAGNOSIS — R197 Diarrhea, unspecified: Secondary | ICD-10-CM | POA: Insufficient documentation

## 2015-02-04 DIAGNOSIS — J45909 Unspecified asthma, uncomplicated: Secondary | ICD-10-CM | POA: Diagnosis not present

## 2015-02-04 DIAGNOSIS — E785 Hyperlipidemia, unspecified: Secondary | ICD-10-CM | POA: Diagnosis not present

## 2015-02-04 DIAGNOSIS — Z7951 Long term (current) use of inhaled steroids: Secondary | ICD-10-CM | POA: Insufficient documentation

## 2015-02-04 DIAGNOSIS — Z9889 Other specified postprocedural states: Secondary | ICD-10-CM | POA: Diagnosis not present

## 2015-02-04 DIAGNOSIS — Z720 Tobacco use: Secondary | ICD-10-CM | POA: Diagnosis not present

## 2015-02-04 DIAGNOSIS — Z7982 Long term (current) use of aspirin: Secondary | ICD-10-CM | POA: Diagnosis not present

## 2015-02-04 DIAGNOSIS — R079 Chest pain, unspecified: Secondary | ICD-10-CM | POA: Diagnosis not present

## 2015-02-04 HISTORY — DX: Systemic involvement of connective tissue, unspecified: M35.9

## 2015-02-04 LAB — URINALYSIS, ROUTINE W REFLEX MICROSCOPIC
Bilirubin Urine: NEGATIVE
Glucose, UA: NEGATIVE mg/dL
Hgb urine dipstick: NEGATIVE
Ketones, ur: NEGATIVE mg/dL
LEUKOCYTES UA: NEGATIVE
Nitrite: NEGATIVE
PROTEIN: NEGATIVE mg/dL
Specific Gravity, Urine: 1.011 (ref 1.005–1.030)
UROBILINOGEN UA: 0.2 mg/dL (ref 0.0–1.0)
pH: 5.5 (ref 5.0–8.0)

## 2015-02-04 LAB — CBC
HCT: 43.7 % (ref 36.0–46.0)
Hemoglobin: 15 g/dL (ref 12.0–15.0)
MCH: 29 pg (ref 26.0–34.0)
MCHC: 34.3 g/dL (ref 30.0–36.0)
MCV: 84.4 fL (ref 78.0–100.0)
PLATELETS: 262 10*3/uL (ref 150–400)
RBC: 5.18 MIL/uL — ABNORMAL HIGH (ref 3.87–5.11)
RDW: 15.5 % (ref 11.5–15.5)
WBC: 7.6 10*3/uL (ref 4.0–10.5)

## 2015-02-04 LAB — BASIC METABOLIC PANEL
Anion gap: 11 (ref 5–15)
BUN: 12 mg/dL (ref 6–23)
CALCIUM: 9.5 mg/dL (ref 8.4–10.5)
CO2: 27 mmol/L (ref 19–32)
Chloride: 97 mmol/L (ref 96–112)
Creatinine, Ser: 0.96 mg/dL (ref 0.50–1.10)
GFR, EST AFRICAN AMERICAN: 77 mL/min — AB (ref >90)
GFR, EST NON AFRICAN AMERICAN: 66 mL/min — AB (ref >90)
Glucose, Bld: 92 mg/dL (ref 70–99)
POTASSIUM: 3.5 mmol/L (ref 3.5–5.1)
Sodium: 135 mmol/L (ref 135–145)

## 2015-02-04 LAB — I-STAT TROPONIN, ED: Troponin i, poc: 0 ng/mL (ref 0.00–0.08)

## 2015-02-04 LAB — HEPATIC FUNCTION PANEL
ALT: 14 U/L (ref 0–35)
AST: 16 U/L (ref 0–37)
Albumin: 3.9 g/dL (ref 3.5–5.2)
Alkaline Phosphatase: 76 U/L (ref 39–117)
BILIRUBIN DIRECT: 0.2 mg/dL (ref 0.0–0.5)
BILIRUBIN INDIRECT: 0.4 mg/dL (ref 0.3–0.9)
Total Bilirubin: 0.6 mg/dL (ref 0.3–1.2)
Total Protein: 8 g/dL (ref 6.0–8.3)

## 2015-02-04 LAB — LIPASE, BLOOD: Lipase: 23 U/L (ref 11–59)

## 2015-02-04 LAB — PREGNANCY, URINE: Preg Test, Ur: NEGATIVE

## 2015-02-04 MED ORDER — SODIUM CHLORIDE 0.9 % IV BOLUS (SEPSIS)
1000.0000 mL | Freq: Once | INTRAVENOUS | Status: AC
  Administered 2015-02-04: 1000 mL via INTRAVENOUS

## 2015-02-04 MED ORDER — MORPHINE SULFATE 4 MG/ML IJ SOLN
4.0000 mg | Freq: Once | INTRAMUSCULAR | Status: AC
  Administered 2015-02-04: 4 mg via INTRAVENOUS
  Filled 2015-02-04: qty 1

## 2015-02-04 MED ORDER — METOCLOPRAMIDE HCL 5 MG/ML IJ SOLN
10.0000 mg | Freq: Once | INTRAMUSCULAR | Status: AC
  Administered 2015-02-04: 10 mg via INTRAVENOUS
  Filled 2015-02-04: qty 2

## 2015-02-04 MED ORDER — OXYCODONE-ACETAMINOPHEN 5-325 MG PO TABS: 1.0000 | ORAL_TABLET | ORAL | Status: DC | PRN

## 2015-02-04 MED ORDER — IOHEXOL 300 MG/ML  SOLN
100.0000 mL | Freq: Once | INTRAMUSCULAR | Status: AC | PRN
  Administered 2015-02-04: 100 mL via INTRAVENOUS

## 2015-02-04 MED ORDER — IOHEXOL 300 MG/ML  SOLN
25.0000 mL | Freq: Once | INTRAMUSCULAR | Status: AC | PRN
  Administered 2015-02-04: 25 mL via ORAL

## 2015-02-04 MED ORDER — DIPHENHYDRAMINE HCL 50 MG/ML IJ SOLN
12.5000 mg | Freq: Once | INTRAMUSCULAR | Status: AC
  Administered 2015-02-04: 12.5 mg via INTRAVENOUS
  Filled 2015-02-04: qty 1

## 2015-02-04 NOTE — ED Notes (Signed)
MD at bedside. 

## 2015-02-04 NOTE — ED Notes (Signed)
Patient states started having back pain going down into R leg "a while back.  I have sciatica".  Patient states high blood pressure today, and has a bad headache.   Patient states she had went to her doctor this morning and he sent her here to get xrays of abdomen.  Patient started having abdominal pain x 3 days ago.  Patient states while she was in radiology, she started having chest pain.  EKG ran in triage.

## 2015-02-04 NOTE — ED Notes (Signed)
Patient transported to Ultrasound 

## 2015-02-04 NOTE — Discharge Instructions (Signed)
Take the prescribed medication as directed. Follow-up with your OB-GYN, you will need a follow-up ultrasound in 6-12 weeks.  Copies of imaging and labs attached on back for physician review. Return to the ED for new or worsening symptoms.

## 2015-02-04 NOTE — ED Provider Notes (Signed)
CSN: [id number]     Arrival date & time 02/04/15  1114 History   First MD Initiated Contact with Patient 02/04/15 1307     Chief Complaint  Patient presents with  . Chest Pain  . Abdominal Pain  . Back Pain  . Headache     (Consider location/radiation/quality/duration/timing/severity/associated sxs/prior Treatment) HPI  Pt is a 54yo female with hx of CAD including a stent in proximal-mid LAD, MI, HTN, asthma, dyslipidemia, tobacco abuse and sciatic, presenting to ED with multiple complaints including chest pain, HA, abdominal pain and lower back pain.  Pt states she was seen by her PCP this morning who increased her BP medication from once daily to twice daily.  States she believes her increased BP is due to the pain she has been in with her headache and abdomen as she has been taking her medications as prescribed.  PCP also sent pt here for x-ray of her abdomen due to intermittent left sided abdominal pain since the end of February, worse over the last 3 days.  Abdominal pain is sharp and aching, 10/10 at worst, worse with palpation. Pt does not know what makes it better or worse, no correlation with eating. Denies hx of kidney stones.  States she believes she is going through menopause but worried of the small chance of pregnancy. Denies urinary or vaginal symptoms.  Abdominal pain is along entire left flank.   Pt also c/o a severe frontal headache that has gradually worsened since last night.  Pain is aching and throbbing, 10/10.  States she has had more frequent headaches since January of this year.  Pt was seen in ED at that time and had a normal head CT.  Denies f/u with neurology. She has not taken any tylenol or motrin for pain.  Denies fever, chills, n/v/d. Denies change in vision. Denies recent trauma. Denies numbness or tingling in arms or legs.    Past Medical History  Diagnosis Date  . Coronary artery disease   . Sciatica   . Hypertension   . Myocardial infarct   . Asthma   .  Dyslipidemia   . Tobacco abuse   . Chest pain   . Collagen vascular disease    Past Surgical History  Procedure Laterality Date  . Cesarean section    . Dilation and curettage, diagnostic / therapeutic    . Nm myocar perf wall motion  08/2009    persantine myoview - normal perfusion in all regions, perfusion defect in anterior region (breast attenuation), EF 52%, low risk scan  . Cardiac catheterization  10/09/2003    [facility], [place]) - LAD with 30% prox narrowing, 50% stenosis in mid-portion of PLA; RCA with 40% narrowing proximally ([name], [name])  . Cardiac catheterization  10/10/2007    70% stenosis in first septal perforator branch of LAD, 60-70% narrowing in mid LAD, 20% narrowing in mid AV groove Cfx with 80% diffuse narrowing in small distal marginal, total occlusion of mid RCA with L to R collaterals, 90% stenosis diffusely in prox branch of RCA followed by 70% stenosis in secondary curve & 80% stenosis in small marginal branch ([name])  . Coronary angioplasty with stent placement  10/31/2007    PCI of distal Cfx with 2.25x67mm Taxus Adam DES, 60% narrowing of mid LAD ([name])  . Cardiac catheterization  05/28/2008    normal L main, RCA with 100% prox lesion w/distal filling from LAD collaterals, LAD with 20% prox  tubular lesion/ 40% mid LAD lesion/previous stent patent ([name])  . Cardiac catheterization  09/15/2009    discrete 100% osital RCA lesion, 50% prox LAD lesion, non-obstructive disease in all coronaries ([name])  . Coronary angioplasty with stent placement  06/11/2011    PCI of prox-mid LAD with 3x86mm DES Resolute ([name])   Family History  Problem Relation Age of Onset  . Leukemia Mother   . Clotting disorder Father     blood clot  . Hypertension Sister   . Stroke Sister 95  . Bleeding Disorder Son     ITP "free bleeding disorder"   History  Substance Use Topics  . Smoking status: Current Every Day Smoker  -- 0.50 packs/day    Types: Cigarettes  . Smokeless tobacco: Not on file  . Alcohol Use: No   OB History    No data available     Review of Systems  Constitutional: Negative for fever, chills, appetite change and fatigue.  Eyes: Positive for photophobia. Negative for pain, redness and visual disturbance.  Respiratory: Negative for shortness of breath.   Cardiovascular: Positive for chest pain. Negative for palpitations and leg swelling.  Gastrointestinal: Positive for abdominal pain ( left sided). Negative for nausea, vomiting, diarrhea, constipation and blood in stool.  Genitourinary: Positive for flank pain ( left). Negative for dysuria, urgency, frequency, hematuria, menstrual problem and pelvic pain.  Neurological: Positive for headaches.  All other systems reviewed and are negative.     Allergies  Other; Dilaudid; and Latex  Home Medications   Prior to Admission medications   Medication Sig Start Date End Date Taking? Authorizing Provider  acyclovir (ZOVIRAX) 400 MG tablet Take 400 mg by mouth daily.   Yes Historical Provider, MD  albuterol (PROVENTIL HFA;VENTOLIN HFA) 108 (90 BASE) MCG/ACT inhaler Inhale 2 puffs into the lungs every 6 (six) hours as needed for wheezing or shortness of breath.   Yes Historical Provider, MD  amLODipine (NORVASC) 5 MG tablet Take 10 mg by mouth daily.   Yes Historical Provider, MD  aspirin EC 325 MG tablet Take 325 mg by mouth daily.   Yes Historical Provider, MD  atorvastatin (LIPITOR) 40 MG tablet Take 1 tablet (40 mg total) by mouth daily. 01/04/14  Yes [name], MD  clopidogrel (PLAVIX) 75 MG tablet Take 1 tablet (75 mg total) by mouth daily with breakfast. 01/04/14  Yes [name], MD  fluticasone (FLOVENT HFA) 220 MCG/ACT inhaler Inhale 1 puff into the lungs 2 (two) times daily.   Yes Historical Provider, MD  fluticasone-salmeterol (ADVAIR HFA) 115-21 MCG/ACT inhaler Inhale 2 puffs into the lungs 2 (two) times daily. 01/04/14  Yes  [name], MD  lisinopril-hydrochlorothiazide (PRINZIDE,ZESTORETIC) 20-12.5 MG per tablet Take 2 tablets by mouth daily.   Yes Historical Provider, MD  nitroGLYCERIN (NITROSTAT) 0.4 MG SL tablet Place 0.4 mg under the tongue every 5 (five) minutes as needed for chest pain.   Yes Historical Provider, MD  zolpidem (AMBIEN) 10 MG tablet Take 10 mg by mouth at bedtime as needed for sleep.   Yes Historical Provider, MD  tinidazole (TINDAMAX) 500 MG tablet Take 2 tablets (1,000 mg total) by mouth daily with breakfast. For 5 days Patient not taking: Reported on 12/01/2014 06/21/14   [name], MD   BP 153/91 mmHg  Pulse 66  Temp(Src) 98 F (36.7 C) (Oral)  Resp 15  Ht 5\' 1"  (1.549 m)  Wt 199 lb 2 oz (  90.323 kg)  BMI 37.64 kg/m2  SpO2 100%  LMP 01/18/2013 Physical Exam  Constitutional: She is oriented to person, place, and time. She appears well-developed and well-nourished. No distress.  HENT:  Head: Normocephalic and atraumatic.  Eyes: Conjunctivae and EOM are normal. Pupils are equal, round, and reactive to light. No scleral icterus.  Neck: Normal range of motion. Neck supple.  Cardiovascular: Normal rate, regular rhythm and normal heart sounds.   Pulmonary/Chest: Effort normal and breath sounds normal. No respiratory distress. She has no wheezes. She has no rales. She exhibits no tenderness.  Abdominal: Soft. Bowel sounds are normal. She exhibits no distension and no mass. There is tenderness. There is guarding. There is no rebound.  Musculoskeletal: Normal range of motion.  Neurological: She is alert and oriented to person, place, and time. She has normal strength. No cranial nerve deficit or sensory deficit. Coordination normal. GCS eye subscore is 4. GCS verbal subscore is 5. GCS motor subscore is 6.  Skin: Skin is warm and dry. She is not diaphoretic.  Nursing note and vitals reviewed.   ED Course  Procedures (including critical care time) Labs Review Labs Reviewed  CBC -  Abnormal; Notable for the following:    RBC 5.18 (*)    All other components within normal limits  BASIC METABOLIC PANEL - Abnormal; Notable for the following:    GFR calc non Af Amer 66 (*)    GFR calc Af Amer 77 (*)    All other components within normal limits  HEPATIC FUNCTION PANEL  LIPASE, BLOOD  URINALYSIS, ROUTINE W REFLEX MICROSCOPIC  PREGNANCY, URINE  I-STAT TROPOININ, ED    Imaging Review Dg Chest 2 View  02/04/2015   CLINICAL DATA:  One day history of midportion chest pain  EXAM: CHEST  2 VIEW  COMPARISON:  Apr 17, 2014  FINDINGS: Lungs are clear. Heart size and pulmonary vascularity are normal. No adenopathy. No pneumothorax. There is degenerative change in the thoracic spine.  IMPRESSION: No edema or consolidation.   Electronically Signed   By: Lowella Grip [name] M.D.   On: 02/04/2015 13:06   Dg Abd 2 Views  02/04/2015   CLINICAL DATA:  Left lower quadrant abdominal pain and diarrhea for 3 days. History hypertension  EXAM: ABDOMEN - 2 VIEW  COMPARISON:  CT abdomen and pelvis - 08/07/2013  FINDINGS: Nonobstructive bowel-gas pattern. No pneumoperitoneum, pneumatosis or portal venous gas.  Several vascular calcifications overlie the lower pelvis bilaterally. Otherwise, no definite abnormal intra-abdominal calcifications.  Limited visualization of the lower thorax suggests borderline cardiomegaly, possibly accentuated due to technique.  No acute osseus abnormalities. Bilateral facet degenerative change of the lower lumbar spine is suspected though incompletely evaluated. Degenerative change of the pubic symphysis.  IMPRESSION: Nonobstructive bowel-gas pattern.   Electronically Signed   By: Sandi Mariscal M.D.   On: 02/04/2015 11:32     EKG Interpretation   Date/Time:  Monday February 04 2015 12:01:38 EDT Ventricular Rate:  68 PR Interval:  166 QRS Duration: 96 QT Interval:  422 QTC Calculation: 448 R Axis:   42 Text Interpretation:  Normal sinus rhythm Low voltage QRS Borderline  ECG  No significant change since last tracing Confirmed by YAO  MD, DAVID  ([id number]) on 02/04/2015 4:17:20 PM      MDM   Final diagnoses:  None    Pt is a 54yo female presenting to ED with multiple complaints including elevated BP (hx of HTN), HA, chest pain and left sided abdominal  pain. No focal neuro deficit. Pt is tender in Left side of abdomen with guarding. No masses.  Pt is afebrile, denies vomiting or diarrhea. Labs BM- earlier today.  Doubt SBO, however, will get CT abd r/o diverticulitis as persistent severe pain.  Doubt mesenteric ischemia as pain not worse after eating. Discussed pt with Dr. Darl Householder who also examined pt.  Agrees with plan to get CT abd w/ contrast.   Pt was given "headache cocktail" IV fluids, morphine, reglan and benadryl. HA improved from 10/10 to 8/10. HA was gradual in onset, similar to previous. Normal head CT in Jan 2016 for similar type of headache.  Doubt SAH, intracranial bleed or mass.  Doubt CVA.    Chest pain has resolved since BP decreasing and pt resting in ED.  BP initially 171/112 upon arrival, BP improved to 146/95 prior to medications being given.  Troponin: 0.00  EKG: similar to previous, see Dr. Kathleen Lime note. Doubt ACS.    4:17 PM CT abdomen- pending.  Pt signed out to Quincy Carnes, PA-C at shift change. Plan is to f/u on CT abdomen, tx appropriately.  If normal, pt may be discharged home to f/u with PCP.     Noland Fordyce, PA-C 02/04/15 1618  Wandra Arthurs, MD 02/04/15 [phone]

## 2015-02-04 NOTE — ED Notes (Signed)
Pt verbalized understanding of d/c instructions and has no further questions.  

## 2015-02-04 NOTE — ED Provider Notes (Signed)
Patient received in sign out from PA Whitsett at shift change.  Briefly, 54 y.o. F with left lower abdominal pain over the past few weeks.  No dysuria or vaginal complaints.  Patient states she does have hx of uterine fibroids.   Patient with other various complaints of headache and CP which have been evaluated in the past.  Lab work thus far reassuring.  Plan:  CT abdomen/pelvis pending.  Will follow results and dispo accordingly.  Results for orders placed or performed during the hospital encounter of 02/04/15  CBC  Result Value Ref Range   WBC 7.6 4.0 - 10.5 K/uL   RBC 5.18 (H) 3.87 - 5.11 MIL/uL   Hemoglobin 15.0 12.0 - 15.0 g/dL   HCT 43.7 36.0 - 46.0 %   MCV 84.4 78.0 - 100.0 fL   MCH 29.0 26.0 - 34.0 pg   MCHC 34.3 30.0 - 36.0 g/dL   RDW 15.5 11.5 - 15.5 %   Platelets 262 150 - 400 K/uL  Basic metabolic panel  Result Value Ref Range   Sodium 135 135 - 145 mmol/L   Potassium 3.5 3.5 - 5.1 mmol/L   Chloride 97 96 - 112 mmol/L   CO2 27 19 - 32 mmol/L   Glucose, Bld 92 70 - 99 mg/dL   BUN 12 6 - 23 mg/dL   Creatinine, Ser 0.96 0.50 - 1.10 mg/dL   Calcium 9.5 8.4 - 10.5 mg/dL   GFR calc non Af Amer 66 (L) >90 mL/min   GFR calc Af Amer 77 (L) >90 mL/min   Anion gap 11 5 - 15  Hepatic function panel  Result Value Ref Range   Total Protein 8.0 6.0 - 8.3 g/dL   Albumin 3.9 3.5 - 5.2 g/dL   AST 16 0 - 37 U/L   ALT 14 0 - 35 U/L   Alkaline Phosphatase 76 39 - 117 U/L   Total Bilirubin 0.6 0.3 - 1.2 mg/dL   Bilirubin, Direct 0.2 0.0 - 0.5 mg/dL   Indirect Bilirubin 0.4 0.3 - 0.9 mg/dL  Lipase, blood  Result Value Ref Range   Lipase 23 11 - 59 U/L  Urinalysis, Routine w reflex microscopic  Result Value Ref Range   Color, Urine YELLOW YELLOW   APPearance CLEAR CLEAR   Specific Gravity, Urine 1.011 1.005 - 1.030   pH 5.5 5.0 - 8.0   Glucose, UA NEGATIVE NEGATIVE mg/dL   Hgb urine dipstick NEGATIVE NEGATIVE   Bilirubin Urine NEGATIVE NEGATIVE   Ketones, ur NEGATIVE NEGATIVE  mg/dL   Protein, ur NEGATIVE NEGATIVE mg/dL   Urobilinogen, UA 0.2 0.0 - 1.0 mg/dL   Nitrite NEGATIVE NEGATIVE   Leukocytes, UA NEGATIVE NEGATIVE  Pregnancy, urine  Result Value Ref Range   Preg Test, Ur NEGATIVE NEGATIVE  I-stat troponin, ED (not at [facility])  Result Value Ref Range   Troponin i, poc 0.00 0.00 - 0.08 ng/mL   Comment 3           Dg Chest 2 View  02/04/2015   CLINICAL DATA:  One day history of midportion chest pain  EXAM: CHEST  2 VIEW  COMPARISON:  Apr 17, 2014  FINDINGS: Lungs are clear. Heart size and pulmonary vascularity are normal. No adenopathy. No pneumothorax. There is degenerative change in the thoracic spine.  IMPRESSION: No edema or consolidation.   Electronically Signed   By: Lowella Grip [name] M.D.   On: 02/04/2015 13:06   US Transvaginal Non-ob  02/04/2015  CLINICAL DATA:  Left pelvic pain, evaluate left ovarian cyst on CT  EXAM: TRANSABDOMINAL AND TRANSVAGINAL ULTRASOUND OF PELVIS  DOPPLER ULTRASOUND OF OVARIES  TECHNIQUE: Both transabdominal and transvaginal ultrasound examinations of the pelvis were performed. Transabdominal technique was performed for global imaging of the pelvis including uterus, ovaries, adnexal regions, and pelvic cul-de-sac.  It was necessary to proceed with endovaginal exam following the transabdominal exam to visualize the bilateral ovaries. Color and duplex Doppler ultrasound was utilized to evaluate blood flow to the ovaries.  COMPARISON:  CT abdomen pelvis dated 02/04/2015  FINDINGS: Uterus  Measurements: 7.9 x 4.9 x 4.7 cm. 3.2 x 2.2 x 2.6 cm subserosal fibroid in the posterior fundus.  Endometrium  Thickness: 11 mm.  No focal abnormality visualized.  Right ovary  Measurements: 3.7 x 2.3 x 2.2 cm. 1.8 x 1.4 x 1.4 cm cyst/follicle, likely physiologic.  Left ovary  Measurements: 4.7 x 2.4 x 3.7 cm. 3.2 x 2.2 x 2.6 cm cyst/follicle, simple, likely physiologic.  Pulsed Doppler evaluation of both ovaries demonstrates normal low-resistance  arterial and venous waveforms.  Other findings  No free fluid.  IMPRESSION: 3.2 cm simple left ovarian cyst/follicle, likely physiologic in a premenopausal female. If clinically warranted, follow-up pelvic ultrasound can be performed in 6-12 weeks.  No evidence of ovarian torsion.  3.2 cm subserosal fibroid in the posterior uterine fundus.   Electronically Signed   By: Julian Hy M.D.   On: 02/04/2015 18:51   US Pelvis Complete  02/04/2015   CLINICAL DATA:  Left pelvic pain, evaluate left ovarian cyst on CT  EXAM: TRANSABDOMINAL AND TRANSVAGINAL ULTRASOUND OF PELVIS  DOPPLER ULTRASOUND OF OVARIES  TECHNIQUE: Both transabdominal and transvaginal ultrasound examinations of the pelvis were performed. Transabdominal technique was performed for global imaging of the pelvis including uterus, ovaries, adnexal regions, and pelvic cul-de-sac.  It was necessary to proceed with endovaginal exam following the transabdominal exam to visualize the bilateral ovaries. Color and duplex Doppler ultrasound was utilized to evaluate blood flow to the ovaries.  COMPARISON:  CT abdomen pelvis dated 02/04/2015  FINDINGS: Uterus  Measurements: 7.9 x 4.9 x 4.7 cm. 3.2 x 2.2 x 2.6 cm subserosal fibroid in the posterior fundus.  Endometrium  Thickness: 11 mm.  No focal abnormality visualized.  Right ovary  Measurements: 3.7 x 2.3 x 2.2 cm. 1.8 x 1.4 x 1.4 cm cyst/follicle, likely physiologic.  Left ovary  Measurements: 4.7 x 2.4 x 3.7 cm. 3.2 x 2.2 x 2.6 cm cyst/follicle, simple, likely physiologic.  Pulsed Doppler evaluation of both ovaries demonstrates normal low-resistance arterial and venous waveforms.  Other findings  No free fluid.  IMPRESSION: 3.2 cm simple left ovarian cyst/follicle, likely physiologic in a premenopausal female. If clinically warranted, follow-up pelvic ultrasound can be performed in 6-12 weeks.  No evidence of ovarian torsion.  3.2 cm subserosal fibroid in the posterior uterine fundus.   Electronically  Signed   By: Julian Hy M.D.   On: 02/04/2015 18:51   Ct Abdomen Pelvis W Contrast  02/04/2015   CLINICAL DATA:  Initial evaluation for chest pain abdomen pain dizziness  EXAM: CT ABDOMEN AND PELVIS WITH CONTRAST  TECHNIQUE: Multidetector CT imaging of the abdomen and pelvis was performed using the standard protocol following bolus administration of intravenous contrast.  CONTRAST:  167mL OMNIPAQUE IOHEXOL 300 MG/ML  SOLN  COMPARISON:  08/07/2013  FINDINGS: Visualized portions of the lung bases clear.  Liver and gallbladder normal. Spleen and pancreas normal. 13 mm right adrenal low-attenuation  nodule stable, likely an adenoma. Left adrenal normal. 15 mm upper pole cyst right kidney stable. Two tiny right renal lesions consistent with stable simple cysts identified. Left kidney is normal.  No significant abnormality involving the aortoiliac vessels. Nonobstructive bowel gas pattern. Fluid contents into the distal colon. Diverticulosis of the descending colon. Appendix appears normal. Small bowel is normal. Stomach is normal.  No free fluid. No significant adenopathy in the abdomen or pelvis. 3.9 left ovarian cystic lesion. Reproductive organs otherwise normal. No acute musculoskeletal findings. Moderate calcified disc osteophytic bulge at L5-S1 similar to prior study.  IMPRESSION: Stable nonacute findings including right renal cysts, right adrenal lesion (likely an adenoma), and 3.9 cm simple appearing left ovarian cystic lesion.Although stable, given patient age and size of cyst, left ovary should be evaluated with ultrasound. This recommendation follows ACR consensus guidelines: White Paper of the ACR Incidental Findings Committee II on Adnexal Findings. J Am Coll Radiol [phone].   Electronically Signed   By: Skipper Cliche M.D.   On: 02/04/2015 16:51   [place] Art/ven Flow Abd Pelv Doppler  02/04/2015   CLINICAL DATA:  Left pelvic pain, evaluate left ovarian cyst on CT  EXAM: TRANSABDOMINAL AND  TRANSVAGINAL ULTRASOUND OF PELVIS  DOPPLER ULTRASOUND OF OVARIES  TECHNIQUE: Both transabdominal and transvaginal ultrasound examinations of the pelvis were performed. Transabdominal technique was performed for global imaging of the pelvis including uterus, ovaries, adnexal regions, and pelvic cul-de-sac.  It was necessary to proceed with endovaginal exam following the transabdominal exam to visualize the bilateral ovaries. Color and duplex Doppler ultrasound was utilized to evaluate blood flow to the ovaries.  COMPARISON:  CT abdomen pelvis dated 02/04/2015  FINDINGS: Uterus  Measurements: 7.9 x 4.9 x 4.7 cm. 3.2 x 2.2 x 2.6 cm subserosal fibroid in the posterior fundus.  Endometrium  Thickness: 11 mm.  No focal abnormality visualized.  Right ovary  Measurements: 3.7 x 2.3 x 2.2 cm. 1.8 x 1.4 x 1.4 cm cyst/follicle, likely physiologic.  Left ovary  Measurements: 4.7 x 2.4 x 3.7 cm. 3.2 x 2.2 x 2.6 cm cyst/follicle, simple, likely physiologic.  Pulsed Doppler evaluation of both ovaries demonstrates normal low-resistance arterial and venous waveforms.  Other findings  No free fluid.  IMPRESSION: 3.2 cm simple left ovarian cyst/follicle, likely physiologic in a premenopausal female. If clinically warranted, follow-up pelvic ultrasound can be performed in 6-12 weeks.  No evidence of ovarian torsion.  3.2 cm subserosal fibroid in the posterior uterine fundus.   Electronically Signed   By: Julian Hy M.D.   On: 02/04/2015 18:51   Dg Abd 2 Views  02/04/2015   CLINICAL DATA:  Left lower quadrant abdominal pain and diarrhea for 3 days. History hypertension  EXAM: ABDOMEN - 2 VIEW  COMPARISON:  CT abdomen and pelvis - 08/07/2013  FINDINGS: Nonobstructive bowel-gas pattern. No pneumoperitoneum, pneumatosis or portal venous gas.  Several vascular calcifications overlie the lower pelvis bilaterally. Otherwise, no definite abnormal intra-abdominal calcifications.  Limited visualization of the lower thorax suggests  borderline cardiomegaly, possibly accentuated due to technique.  No acute osseus abnormalities. Bilateral facet degenerative change of the lower lumbar spine is suspected though incompletely evaluated. Degenerative change of the pubic symphysis.  IMPRESSION: Nonobstructive bowel-gas pattern.   Electronically Signed   By: Sandi Mariscal M.D.   On: 02/04/2015 11:32      5:29 PM CT resulted, no evidence of diverticulitis but incidental findings of right renal cysts, likely adrenal adenoma and left sided ovarian cysts.  Ultrasound  recommended for further evaluation.  Patient has OB-GYN but is not scheduled to see them until July.  Will go ahead with u/s while in ED.  Pelvic u/s with physiologic ovarian cysts and fibroids, no evidence of torsion.  Patient has OB-GYN that she will follow up with for repeat ultrasound in 6-12 weeks as recommended.  Copies of lab work and imaging studies given for physician review.  Patient d/c home with short supply percocet for pain control.  Discussed plan with patient, he/she acknowledged understanding and agreed with plan of care.  Return precautions given for new or worsening symptoms.  Larene Pickett, PA-C 02/04/15 [id number]  Debby Freiberg, MD 02/06/15 [phone]

## 2015-02-06 ENCOUNTER — Telehealth: Payer: Self-pay | Admitting: Cardiovascular Disease

## 2015-02-06 NOTE — Telephone Encounter (Signed)
Pt called in wanting to know if Dr. Gwenlyn Found received some paperwork that would be coming from Dr. Claudie Revering  Office (dentist) in regards to some medication that she is taking. She would like to the questionnaire to be filled out promptly and faxed back so she can have the dental services done that she needs. Please call.   The number to Dr. Claudie Revering office is [phone]  Thanks

## 2015-02-07 ENCOUNTER — Ambulatory Visit: Payer: Medicaid Other | Admitting: Obstetrics

## 2015-02-07 NOTE — Telephone Encounter (Signed)
Paperwork received.  Dr Gwenlyn Found will review tomorrow.

## 2015-02-08 ENCOUNTER — Ambulatory Visit (INDEPENDENT_AMBULATORY_CARE_PROVIDER_SITE_OTHER): Payer: Medicaid Other | Admitting: Obstetrics

## 2015-02-08 ENCOUNTER — Encounter: Payer: Self-pay | Admitting: Obstetrics

## 2015-02-08 VITALS — BP 152/100 | HR 79 | Wt 200.0 lb

## 2015-02-08 DIAGNOSIS — N8320 Unspecified ovarian cysts: Secondary | ICD-10-CM | POA: Diagnosis not present

## 2015-02-08 DIAGNOSIS — R102 Pelvic and perineal pain: Secondary | ICD-10-CM | POA: Diagnosis not present

## 2015-02-08 DIAGNOSIS — K589 Irritable bowel syndrome without diarrhea: Secondary | ICD-10-CM | POA: Diagnosis not present

## 2015-02-08 DIAGNOSIS — R197 Diarrhea, unspecified: Secondary | ICD-10-CM | POA: Diagnosis not present

## 2015-02-08 DIAGNOSIS — N83202 Unspecified ovarian cyst, left side: Secondary | ICD-10-CM

## 2015-02-08 MED ORDER — DICYCLOMINE HCL 20 MG PO TABS: 20.0000 mg | ORAL_TABLET | Freq: Three times a day (TID) | ORAL | Status: DC

## 2015-02-08 MED ORDER — KETOROLAC TROMETHAMINE 60 MG/2ML IM SOLN
60.0000 mg | Freq: Once | INTRAMUSCULAR | Status: AC
Start: 2015-02-08 — End: 2015-02-08
  Administered 2015-02-08: 60 mg via INTRAMUSCULAR

## 2015-02-08 NOTE — Addendum Note (Signed)
Addended by: Lewie Loron D on: 02/08/2015 04:19 PM   Modules accepted: Orders

## 2015-02-08 NOTE — Progress Notes (Signed)
Patient ID: Tracey Manning, female   DOB: May 10, 1961, 54 y.o.   MRN: [id number]  Chief Complaint  Patient presents with  . Ovarian Cyst    HPI Tracey Manning is a 54 y.o. female.  Lower abdominal pain since February.  Has also had diarrhea since February.  HPI  Past Medical History  Diagnosis Date  . Coronary artery disease   . Sciatica   . Hypertension   . Myocardial infarct   . Asthma   . Dyslipidemia   . Tobacco abuse   . Chest pain   . Collagen vascular disease     Past Surgical History  Procedure Laterality Date  . Cesarean section    . Dilation and curettage, diagnostic / therapeutic    . Nm myocar perf wall motion  08/2009    persantine myoview - normal perfusion in all regions, perfusion defect in anterior region (breast attenuation), EF 52%, low risk scan  . Cardiac catheterization  10/09/2003    [facility] [place], [place]) - LAD with 30% prox narrowing, 50% stenosis in mid-portion of PLA; RCA with 40% narrowing proximally ([name], [name])  . Cardiac catheterization  10/10/2007    70% stenosis in first septal perforator branch of LAD, 60-70% narrowing in mid LAD, 20% narrowing in mid AV groove Cfx with 80% diffuse narrowing in small distal marginal, total occlusion of mid RCA with L to R collaterals, 90% stenosis diffusely in prox branch of RCA followed by 70% stenosis in secondary curve & 80% stenosis in small marginal branch ([name])  . Coronary angioplasty with stent placement  10/31/2007    PCI of distal Cfx with 2.25x26mm Taxus Adam DES, 60% narrowing of mid LAD ([name])  . Cardiac catheterization  05/28/2008    normal L main, RCA with 100% prox lesion w/distal filling from LAD collaterals, LAD with 20% prox tubular lesion/ 40% mid LAD lesion/previous stent patent ([name])  . Cardiac catheterization  09/15/2009    discrete 100% osital RCA lesion, 50% prox LAD lesion, non-obstructive disease in all coronaries ([name])  .  Coronary angioplasty with stent placement  06/11/2011    PCI of prox-mid LAD with 3x41mm DES Resolute ([name])    Family History  Problem Relation Age of Onset  . Leukemia Mother   . Clotting disorder Father     blood clot  . Hypertension Sister   . Stroke Sister 46  . Bleeding Disorder Son     ITP "free bleeding disorder"    Social History History  Substance Use Topics  . Smoking status: Current Every Day Smoker -- 0.50 packs/day    Types: Cigarettes  . Smokeless tobacco: Not on file  . Alcohol Use: No    Allergies  Allergen Reactions  . Other Anaphylaxis and Hives    Fresh strawberries (throat swelled)  . Dilaudid [Hydromorphone Hcl] Nausea And Vomiting  . Latex Swelling    No reaction with bandaids    Current Outpatient Prescriptions  Medication Sig Dispense Refill  . acyclovir (ZOVIRAX) 400 MG tablet Take 400 mg by mouth daily.    Marland Kitchen albuterol (PROVENTIL HFA;VENTOLIN HFA) 108 (90 BASE) MCG/ACT inhaler Inhale 2 puffs into the lungs every 6 (six) hours as needed for wheezing or shortness of breath.    Marland Kitchen amLODipine (NORVASC) 5 MG tablet Take 10 mg by mouth daily.    Marland Kitchen aspirin EC 325 MG tablet Take 325 mg by mouth daily.    Marland Kitchen  atorvastatin (LIPITOR) 40 MG tablet Take 1 tablet (40 mg total) by mouth daily. 30 tablet 2  . clopidogrel (PLAVIX) 75 MG tablet Take 1 tablet (75 mg total) by mouth daily with breakfast. 30 tablet 2  . dicyclomine (BENTYL) 20 MG tablet Take 1 tablet (20 mg total) by mouth every 8 (eight) hours. 42 tablet 1  . fluticasone (FLOVENT HFA) 220 MCG/ACT inhaler Inhale 1 puff into the lungs 2 (two) times daily.    . fluticasone-salmeterol (ADVAIR HFA) 115-21 MCG/ACT inhaler Inhale 2 puffs into the lungs 2 (two) times daily. 1 Inhaler 12  . lisinopril-hydrochlorothiazide (PRINZIDE,ZESTORETIC) 20-12.5 MG per tablet Take 2 tablets by mouth daily.    . nitroGLYCERIN (NITROSTAT) 0.4 MG SL tablet Place 0.4 mg under the tongue every 5 (five) minutes as needed  for chest pain.    Marland Kitchen oxyCODONE-acetaminophen (PERCOCET/ROXICET) 5-325 MG per tablet Take 1 tablet by mouth every 4 (four) hours as needed. 15 tablet 0  . tinidazole (TINDAMAX) 500 MG tablet Take 2 tablets (1,000 mg total) by mouth daily with breakfast. For 5 days (Patient not taking: Reported on 12/01/2014) 10 tablet 0  . zolpidem (AMBIEN) 10 MG tablet Take 10 mg by mouth at bedtime as needed for sleep.     No current facility-administered medications for this visit.    Review of Systems Review of Systems Constitutional: negative for fatigue and weight loss Respiratory: negative for cough and wheezing Cardiovascular: negative for chest pain, fatigue and palpitations Gastrointestinal: positive for abdominal pain and change in bowel habits Genitourinary: pelvic pain Integument/breast: negative for nipple discharge Musculoskeletal:negative for myalgias Neurological: negative for gait problems and tremors Behavioral/Psych: negative for abusive relationship, depression Endocrine: negative for temperature intolerance     Blood pressure 152/100, pulse 79, weight 200 lb (90.719 kg), last menstrual period 01/18/2015.  Physical Exam Physical Exam                Abdomen:  No organomegaly, soft.  Tender bilateral, LLQ>RLQ  Pelvis:  External genitalia: normal general appearance Urinary system: urethral meatus normal and bladder without fullness, nontender Vaginal: normal without tenderness, induration or masses Cervix: normal appearance Adnexa: LLQ tenderness.  No masses. Uterus: anteverted and non-tender, normal size      Data Reviewed Labs Ultrasound CT scan  Assessment     Pelvic pain. Left ovarian cyst, small, simple  Diarrhea.  R/O IBS-D.  May need GI referral.    Plan    CA 125 ordered Bentyl Rx F/U 2 weeks  Orders Placed This Encounter  Procedures  . SureSwab Bacterial Vaginosis/itis  . CA 125   Meds ordered this encounter  Medications  . dicyclomine (BENTYL)  20 MG tablet    Sig: Take 1 tablet (20 mg total) by mouth every 8 (eight) hours.    Dispense:  42 tablet    Refill:  1

## 2015-02-09 LAB — CA 125: CA 125: 10 U/mL (ref <35)

## 2015-02-11 NOTE — Telephone Encounter (Signed)
Form filled out by Dr Gwenlyn Found and faxed back to Dr Mable Paris.

## 2015-02-12 LAB — SURESWAB BACTERIAL VAGINOSIS/ITIS
Atopobium vaginae: 6.9 Log (cells/mL)
BV CATEGORY: UNDETERMINED — AB
C. TROPICALIS, DNA: DETECTED — AB
C. albicans, DNA: DETECTED — AB
C. glabrata, DNA: NOT DETECTED
C. parapsilosis, DNA: NOT DETECTED
Gardnerella vaginalis: 8 Log (cells/mL)
LACTOBACILLUS SPECIES: 6.5 Log (cells/mL)
MEGASPHAERA SPECIES: NOT DETECTED Log (cells/mL)
T. VAGINALIS RNA, QL TMA: NOT DETECTED

## 2015-02-14 ENCOUNTER — Other Ambulatory Visit: Payer: Self-pay | Admitting: Obstetrics

## 2015-02-14 DIAGNOSIS — B3731 Acute candidiasis of vulva and vagina: Secondary | ICD-10-CM

## 2015-02-14 DIAGNOSIS — B373 Candidiasis of vulva and vagina: Secondary | ICD-10-CM

## 2015-02-14 DIAGNOSIS — N760 Acute vaginitis: Principal | ICD-10-CM

## 2015-02-14 DIAGNOSIS — B9689 Other specified bacterial agents as the cause of diseases classified elsewhere: Secondary | ICD-10-CM

## 2015-02-14 MED ORDER — FLUCONAZOLE 150 MG PO TABS: 150.0000 mg | ORAL_TABLET | Freq: Once | ORAL | Status: DC

## 2015-02-14 MED ORDER — TINIDAZOLE 500 MG PO TABS: 1.0000 g | ORAL_TABLET | Freq: Every day | ORAL | Status: DC

## 2015-02-21 ENCOUNTER — Ambulatory Visit (INDEPENDENT_AMBULATORY_CARE_PROVIDER_SITE_OTHER): Payer: Medicaid Other | Admitting: Obstetrics

## 2015-02-21 ENCOUNTER — Encounter: Payer: Self-pay | Admitting: Obstetrics

## 2015-02-21 VITALS — BP 174/112 | HR 66 | Temp 97.7°F | Ht 61.0 in | Wt 201.0 lb

## 2015-02-21 DIAGNOSIS — N8320 Unspecified ovarian cysts: Secondary | ICD-10-CM

## 2015-02-21 DIAGNOSIS — N83202 Unspecified ovarian cyst, left side: Secondary | ICD-10-CM

## 2015-02-21 DIAGNOSIS — R52 Pain, unspecified: Secondary | ICD-10-CM

## 2015-02-21 MED ORDER — OXYCODONE-ACETAMINOPHEN 10-325 MG PO TABS
1.0000 | ORAL_TABLET | Freq: Four times a day (QID) | ORAL | Status: DC | PRN
Start: 2015-02-21 — End: 2015-03-28

## 2015-02-21 NOTE — Progress Notes (Signed)
Patient ID: Tracey Manning, female   DOB: Sep 06, 1961, 54 y.o.   MRN: [id number]  Chief Complaint  Patient presents with  . Follow-up    Ovaries     HPI Tracey Manning is a 54 y.o. female.  LLQ pelvic pain.  Ultrasound reveals small, simple left ovarian cyst.  CA 125 is normal. HPI  Past Medical History  Diagnosis Date  . Coronary artery disease   . Sciatica   . Hypertension   . Myocardial infarct   . Asthma   . Dyslipidemia   . Tobacco abuse   . Chest pain   . Collagen vascular disease     Past Surgical History  Procedure Laterality Date  . Cesarean section    . Dilation and curettage, diagnostic / therapeutic    . Nm myocar perf wall motion  08/2009    persantine myoview - normal perfusion in all regions, perfusion defect in anterior region (breast attenuation), EF 52%, low risk scan  . Cardiac catheterization  10/09/2003    [facility] [place], [place]) - LAD with 30% prox narrowing, 50% stenosis in mid-portion of PLA; RCA with 40% narrowing proximally ([name], [name])  . Cardiac catheterization  10/10/2007    70% stenosis in first septal perforator branch of LAD, 60-70% narrowing in mid LAD, 20% narrowing in mid AV groove Cfx with 80% diffuse narrowing in small distal marginal, total occlusion of mid RCA with L to R collaterals, 90% stenosis diffusely in prox branch of RCA followed by 70% stenosis in secondary curve & 80% stenosis in small marginal branch ([name])  . Coronary angioplasty with stent placement  10/31/2007    PCI of distal Cfx with 2.25x85mm Taxus Adam DES, 60% narrowing of mid LAD ([name])  . Cardiac catheterization  05/28/2008    normal L main, RCA with 100% prox lesion w/distal filling from LAD collaterals, LAD with 20% prox tubular lesion/ 40% mid LAD lesion/previous stent patent ([name])  . Cardiac catheterization  09/15/2009    discrete 100% osital RCA lesion, 50% prox LAD lesion, non-obstructive disease in all coronaries (Dr.  [name])  . Coronary angioplasty with stent placement  06/11/2011    PCI of prox-mid LAD with 3x35mm DES Resolute ([name])    Family History  Problem Relation Age of Onset  . Leukemia Mother   . Clotting disorder Father     blood clot  . Hypertension Sister   . Stroke Sister 66  . Bleeding Disorder Son     ITP "free bleeding disorder"    Social History History  Substance Use Topics  . Smoking status: Current Every Day Smoker -- 0.50 packs/day    Types: Cigarettes  . Smokeless tobacco: Not on file  . Alcohol Use: No    Allergies  Allergen Reactions  . Other Anaphylaxis and Hives    Fresh strawberries (throat swelled)  . Dilaudid [Hydromorphone Hcl] Nausea And Vomiting  . Latex Swelling    No reaction with bandaids    Current Outpatient Prescriptions  Medication Sig Dispense Refill  . acyclovir (ZOVIRAX) 400 MG tablet Take 400 mg by mouth daily.    Marland Kitchen albuterol (PROVENTIL HFA;VENTOLIN HFA) 108 (90 BASE) MCG/ACT inhaler Inhale 2 puffs into the lungs every 6 (six) hours as needed for wheezing or shortness of breath.    Marland Kitchen amLODipine (NORVASC) 5 MG tablet Take 10 mg by mouth daily.    Marland Kitchen aspirin EC 325 MG tablet Take 325 mg  by mouth daily.    Marland Kitchen atorvastatin (LIPITOR) 40 MG tablet Take 1 tablet (40 mg total) by mouth daily. 30 tablet 2  . clopidogrel (PLAVIX) 75 MG tablet Take 1 tablet (75 mg total) by mouth daily with breakfast. 30 tablet 2  . dicyclomine (BENTYL) 20 MG tablet Take 1 tablet (20 mg total) by mouth every 8 (eight) hours. 42 tablet 1  . fluticasone (FLOVENT HFA) 220 MCG/ACT inhaler Inhale 1 puff into the lungs 2 (two) times daily.    . fluticasone-salmeterol (ADVAIR HFA) 115-21 MCG/ACT inhaler Inhale 2 puffs into the lungs 2 (two) times daily. 1 Inhaler 12  . lisinopril-hydrochlorothiazide (PRINZIDE,ZESTORETIC) 20-12.5 MG per tablet Take 2 tablets by mouth daily.    . nitroGLYCERIN (NITROSTAT) 0.4 MG SL tablet Place 0.4 mg under the tongue every 5 (five)  minutes as needed for chest pain.    Marland Kitchen oxyCODONE-acetaminophen (PERCOCET/ROXICET) 5-325 MG per tablet Take 1 tablet by mouth every 4 (four) hours as needed. 15 tablet 0  . zolpidem (AMBIEN) 10 MG tablet Take 10 mg by mouth at bedtime as needed for sleep.    Marland Kitchen oxyCODONE-acetaminophen (PERCOCET) 10-325 MG per tablet Take 1 tablet by mouth every 6 (six) hours as needed for pain. 40 tablet 0   No current facility-administered medications for this visit.    Review of Systems Review of Systems Constitutional: negative for fatigue and weight loss Respiratory: negative for cough and wheezing Cardiovascular: negative for chest pain, fatigue and palpitations Gastrointestinal: negative for abdominal pain and change in bowel habits Genitourinary: Left ovarian cyst Integument/breast: negative for nipple discharge Musculoskeletal:negative for myalgias Neurological: negative for gait problems and tremors Behavioral/Psych: negative for abusive relationship, depression Endocrine: negative for temperature intolerance     Blood pressure 174/112, pulse 66, temperature 97.7 F (36.5 C), height 5\' 1"  (1.549 m), weight 201 lb (91.173 kg), last menstrual period 02/19/2015.  Physical Exam Physical Exam: 100% of 10 min visit spent on counseling and coordination of care.   Data Reviewed Ultrasound CA 125  Assessment     Symptomatic, small, simple left ovarian cyst.  Normal CA 125.   Perimenopause Dysmenorrhea      Plan    Repeat ultrasound in 6-8 weeks. Oxycodone Rx  No orders of the defined types were placed in this encounter.   Meds ordered this encounter  Medications  . oxyCODONE-acetaminophen (PERCOCET) 10-325 MG per tablet    Sig: Take 1 tablet by mouth every 6 (six) hours as needed for pain.    Dispense:  40 tablet    Refill:  0

## 2015-02-25 ENCOUNTER — Other Ambulatory Visit: Payer: Self-pay | Admitting: Obstetrics

## 2015-02-25 ENCOUNTER — Telehealth: Payer: Self-pay | Admitting: *Deleted

## 2015-02-25 ENCOUNTER — Telehealth: Payer: Self-pay | Admitting: Obstetrics

## 2015-02-25 ENCOUNTER — Emergency Department (HOSPITAL_COMMUNITY)
Admission: EM | Admit: 2015-02-25 | Discharge: 2015-02-26 | Disposition: A | Discharge status: Home / Self Care | Payer: Medicaid Other | Attending: Emergency Medicine | Admitting: Emergency Medicine

## 2015-02-25 ENCOUNTER — Encounter (HOSPITAL_COMMUNITY): Payer: Self-pay

## 2015-02-25 DIAGNOSIS — M5430 Sciatica, unspecified side: Secondary | ICD-10-CM | POA: Insufficient documentation

## 2015-02-25 DIAGNOSIS — Z7982 Long term (current) use of aspirin: Secondary | ICD-10-CM | POA: Diagnosis not present

## 2015-02-25 DIAGNOSIS — I10 Essential (primary) hypertension: Secondary | ICD-10-CM | POA: Diagnosis not present

## 2015-02-25 DIAGNOSIS — Z7902 Long term (current) use of antithrombotics/antiplatelets: Secondary | ICD-10-CM | POA: Insufficient documentation

## 2015-02-25 DIAGNOSIS — I2510 Atherosclerotic heart disease of native coronary artery without angina pectoris: Secondary | ICD-10-CM | POA: Insufficient documentation

## 2015-02-25 DIAGNOSIS — Z7951 Long term (current) use of inhaled steroids: Secondary | ICD-10-CM | POA: Insufficient documentation

## 2015-02-25 DIAGNOSIS — Z79899 Other long term (current) drug therapy: Secondary | ICD-10-CM | POA: Insufficient documentation

## 2015-02-25 DIAGNOSIS — R102 Pelvic and perineal pain: Secondary | ICD-10-CM

## 2015-02-25 DIAGNOSIS — R1031 Right lower quadrant pain: Secondary | ICD-10-CM | POA: Diagnosis present

## 2015-02-25 DIAGNOSIS — Z720 Tobacco use: Secondary | ICD-10-CM | POA: Insufficient documentation

## 2015-02-25 DIAGNOSIS — I252 Old myocardial infarction: Secondary | ICD-10-CM | POA: Insufficient documentation

## 2015-02-25 DIAGNOSIS — E785 Hyperlipidemia, unspecified: Secondary | ICD-10-CM | POA: Insufficient documentation

## 2015-02-25 DIAGNOSIS — Z9861 Coronary angioplasty status: Secondary | ICD-10-CM | POA: Insufficient documentation

## 2015-02-25 DIAGNOSIS — R109 Unspecified abdominal pain: Secondary | ICD-10-CM

## 2015-02-25 DIAGNOSIS — Z9889 Other specified postprocedural states: Secondary | ICD-10-CM | POA: Insufficient documentation

## 2015-02-25 DIAGNOSIS — R1084 Generalized abdominal pain: Secondary | ICD-10-CM | POA: Diagnosis not present

## 2015-02-25 DIAGNOSIS — J45909 Unspecified asthma, uncomplicated: Secondary | ICD-10-CM | POA: Insufficient documentation

## 2015-02-25 DIAGNOSIS — Z91040 Latex allergy status: Secondary | ICD-10-CM | POA: Diagnosis not present

## 2015-02-25 DIAGNOSIS — Z3202 Encounter for pregnancy test, result negative: Secondary | ICD-10-CM | POA: Diagnosis not present

## 2015-02-25 LAB — CBC WITH DIFFERENTIAL/PLATELET
BASOS ABS: 0 10*3/uL (ref 0.0–0.1)
BASOS PCT: 0 % (ref 0–1)
EOS ABS: 0.1 10*3/uL (ref 0.0–0.7)
EOS PCT: 1 % (ref 0–5)
HCT: 42.2 % (ref 36.0–46.0)
Hemoglobin: 14.3 g/dL (ref 12.0–15.0)
LYMPHS PCT: 22 % (ref 12–46)
Lymphs Abs: 1.7 10*3/uL (ref 0.7–4.0)
MCH: 28.7 pg (ref 26.0–34.0)
MCHC: 33.9 g/dL (ref 30.0–36.0)
MCV: 84.6 fL (ref 78.0–100.0)
Monocytes Absolute: 0.3 10*3/uL (ref 0.1–1.0)
Monocytes Relative: 4 % (ref 3–12)
Neutro Abs: 5.6 10*3/uL (ref 1.7–7.7)
Neutrophils Relative %: 73 % (ref 43–77)
PLATELETS: 225 10*3/uL (ref 150–400)
RBC: 4.99 MIL/uL (ref 3.87–5.11)
RDW: 15.7 % — AB (ref 11.5–15.5)
WBC: 7.7 10*3/uL (ref 4.0–10.5)

## 2015-02-25 LAB — COMPREHENSIVE METABOLIC PANEL
ALBUMIN: 3.7 g/dL (ref 3.5–5.2)
ALT: 20 U/L (ref 0–35)
AST: 19 U/L (ref 0–37)
Alkaline Phosphatase: 69 U/L (ref 39–117)
Anion gap: 8 (ref 5–15)
BUN: 10 mg/dL (ref 6–23)
CALCIUM: 9.1 mg/dL (ref 8.4–10.5)
CO2: 27 mmol/L (ref 19–32)
CREATININE: 0.98 mg/dL (ref 0.50–1.10)
Chloride: 99 mmol/L (ref 96–112)
GFR calc Af Amer: 75 mL/min — ABNORMAL LOW (ref >90)
GFR calc non Af Amer: 65 mL/min — ABNORMAL LOW (ref >90)
Glucose, Bld: 121 mg/dL — ABNORMAL HIGH (ref 70–99)
POTASSIUM: 3.2 mmol/L — AB (ref 3.5–5.1)
SODIUM: 134 mmol/L — AB (ref 135–145)
Total Bilirubin: 0.5 mg/dL (ref 0.3–1.2)
Total Protein: 6.9 g/dL (ref 6.0–8.3)

## 2015-02-25 LAB — URINE MICROSCOPIC-ADD ON

## 2015-02-25 LAB — URINALYSIS, ROUTINE W REFLEX MICROSCOPIC
Glucose, UA: NEGATIVE mg/dL
Hgb urine dipstick: NEGATIVE
Ketones, ur: 15 mg/dL — AB
Nitrite: NEGATIVE
Protein, ur: 30 mg/dL — AB
SPECIFIC GRAVITY, URINE: 1.026 (ref 1.005–1.030)
Urobilinogen, UA: 1 mg/dL (ref 0.0–1.0)
pH: 6 (ref 5.0–8.0)

## 2015-02-25 LAB — LIPASE, BLOOD: LIPASE: 21 U/L (ref 11–59)

## 2015-02-25 MED ORDER — MORPHINE SULFATE 4 MG/ML IJ SOLN
4.0000 mg | Freq: Once | INTRAMUSCULAR | Status: AC
Start: 2015-02-25 — End: 2015-02-25
  Administered 2015-02-25: 4 mg via INTRAVENOUS
  Filled 2015-02-25: qty 1

## 2015-02-25 MED ORDER — ONDANSETRON HCL 4 MG/2ML IJ SOLN
4.0000 mg | Freq: Once | INTRAMUSCULAR | Status: AC
  Administered 2015-02-25: 4 mg via INTRAVENOUS
  Filled 2015-02-25: qty 2

## 2015-02-25 MED ORDER — SODIUM CHLORIDE 0.9 % IV SOLN
1000.0000 mL | Freq: Once | INTRAVENOUS | Status: AC
  Administered 2015-02-25: 1000 mL via INTRAVENOUS

## 2015-02-25 MED ORDER — SODIUM CHLORIDE 0.9 % IV SOLN
1000.0000 mL | INTRAVENOUS | Status: DC
  Administered 2015-02-26: 1000 mL via INTRAVENOUS

## 2015-02-25 NOTE — ED Provider Notes (Signed)
CSN: [id number]     Arrival date & time 02/25/15  1913 History   First MD Initiated Contact with Patient 02/25/15 2216     Chief Complaint  Patient presents with  . Abdominal Pain     (Consider location/radiation/quality/duration/timing/severity/associated sxs/prior Treatment) Patient is a 54 y.o. female presenting with abdominal pain. The history is provided by the patient.  Abdominal Pain She complains of right lower abdominal pain for the last 2 days. Pain is severe and she rates at 10/10. She describes it as severe cramping pain. There is no radiation of pain but she does also have some cramping in her lower back. Pain is worse when she coughs but nothing makes it better. She denies fever, chills, sweats. She denies nausea, vomiting, diarrhea. There has been some mild anorexia. She denies urinary urgency, frequency, tenesmus, dysuria. She is currently on her menses. She states she has not had sexual intercourse for the last 3 months. She was seen in the emergency department 2 weeks ago and diagnosed with a left-sided ovarian cyst. She had taken 2 tablets of oxycodone-acetaminophen 10-325 without any relief.  Past Medical History  Diagnosis Date  . Coronary artery disease   . Sciatica   . Hypertension   . Myocardial infarct   . Asthma   . Dyslipidemia   . Tobacco abuse   . Chest pain   . Collagen vascular disease    Past Surgical History  Procedure Laterality Date  . Cesarean section    . Dilation and curettage, diagnostic / therapeutic    . Nm myocar perf wall motion  08/2009    persantine myoview - normal perfusion in all regions, perfusion defect in anterior region (breast attenuation), EF 52%, low risk scan  . Cardiac catheterization  10/09/2003    [place], [place]) - LAD with 30% prox narrowing, 50% stenosis in mid-portion of PLA; RCA with 40% narrowing proximally ([name], [name])  . Cardiac catheterization  10/10/2007    70% stenosis in first septal  perforator branch of LAD, 60-70% narrowing in mid LAD, 20% narrowing in mid AV groove Cfx with 80% diffuse narrowing in small distal marginal, total occlusion of mid RCA with L to R collaterals, 90% stenosis diffusely in prox branch of RCA followed by 70% stenosis in secondary curve & 80% stenosis in small marginal branch ([name])  . Coronary angioplasty with stent placement  10/31/2007    PCI of distal Cfx with 2.25x42mm Taxus Adam DES, 60% narrowing of mid LAD ([name])  . Cardiac catheterization  05/28/2008    normal L main, RCA with 100% prox lesion w/distal filling from LAD collaterals, LAD with 20% prox tubular lesion/ 40% mid LAD lesion/previous stent patent ([name])  . Cardiac catheterization  09/15/2009    discrete 100% osital RCA lesion, 50% prox LAD lesion, non-obstructive disease in all coronaries ([name])  . Coronary angioplasty with stent placement  06/11/2011    PCI of prox-mid LAD with 3x70mm DES Resolute ([name])   Family History  Problem Relation Age of Onset  . Leukemia Mother   . Clotting disorder Father     blood clot  . Hypertension Sister   . Stroke Sister 18  . Bleeding Disorder Son     ITP "free bleeding disorder"   History  Substance Use Topics  . Smoking status: Current Every Day Smoker -- 0.50 packs/day    Types: Cigarettes  . Smokeless tobacco: Not on  file  . Alcohol Use: No   OB History    No data available     Review of Systems  Gastrointestinal: Positive for abdominal pain.  All other systems reviewed and are negative.     Allergies  Other; Dilaudid; and Latex  Home Medications   Prior to Admission medications   Medication Sig Start Date End Date Taking? Authorizing Provider  acyclovir (ZOVIRAX) 400 MG tablet Take 400 mg by mouth daily.    Historical Provider, MD  albuterol (PROVENTIL HFA;VENTOLIN HFA) 108 (90 BASE) MCG/ACT inhaler Inhale 2 puffs into the lungs every 6 (six) hours as needed for wheezing or  shortness of breath.    Historical Provider, MD  amLODipine (NORVASC) 5 MG tablet Take 10 mg by mouth daily.    Historical Provider, MD  aspirin EC 325 MG tablet Take 325 mg by mouth daily.    Historical Provider, MD  atorvastatin (LIPITOR) 40 MG tablet Take 1 tablet (40 mg total) by mouth daily. 01/04/14   [name], MD  clopidogrel (PLAVIX) 75 MG tablet Take 1 tablet (75 mg total) by mouth daily with breakfast. 01/04/14   [name], MD  dicyclomine (BENTYL) 20 MG tablet Take 1 tablet (20 mg total) by mouth every 8 (eight) hours. 02/08/15   [name], MD  fluticasone (FLOVENT HFA) 220 MCG/ACT inhaler Inhale 1 puff into the lungs 2 (two) times daily.    Historical Provider, MD  fluticasone-salmeterol (ADVAIR HFA) 115-21 MCG/ACT inhaler Inhale 2 puffs into the lungs 2 (two) times daily. 01/04/14   [name], MD  lisinopril-hydrochlorothiazide (PRINZIDE,ZESTORETIC) 20-12.5 MG per tablet Take 2 tablets by mouth daily.    Historical Provider, MD  nitroGLYCERIN (NITROSTAT) 0.4 MG SL tablet Place 0.4 mg under the tongue every 5 (five) minutes as needed for chest pain.    Historical Provider, MD  oxyCODONE-acetaminophen (PERCOCET) 10-325 MG per tablet Take 1 tablet by mouth every 6 (six) hours as needed for pain. 02/21/15   [name], MD  oxyCODONE-acetaminophen (PERCOCET/ROXICET) 5-325 MG per tablet Take 1 tablet by mouth every 4 (four) hours as needed. 02/04/15   Larene Pickett, PA-C  zolpidem (AMBIEN) 10 MG tablet Take 10 mg by mouth at bedtime as needed for sleep.    Historical Provider, MD   BP 178/96 mmHg  Pulse 75  Temp(Src) 98.2 F (36.8 C) (Oral)  Resp 16  SpO2 98%  LMP 02/19/2015 Physical Exam  Nursing note and vitals reviewed.  54 year old female, resting comfortably and in no acute distress. Vital signs are significant for hypertension. Oxygen saturation is 98%, which is normal. Head is normocephalic and atraumatic. PERRLA, EOMI. Oropharynx is clear. Neck is  nontender and supple without adenopathy or JVD. Back is nontender and there is no CVA tenderness. Lungs are clear without rales, wheezes, or rhonchi. Chest is nontender. Heart has regular rate and rhythm without murmur. Abdomen is soft, flat, with diffuse tenderness. Tenderness is worse in the right lower quadrant. There is no rebound or guarding. There are no masses or hepatosplenomegaly and peristalsis is hypoactive. Extremities have no cyanosis or edema, full range of motion is present. Skin is warm and dry without rash. Neurologic: Mental status is normal, cranial nerves are intact, there are no motor or sensory deficits.  ED Course  Procedures (including critical care time) Labs Review Results for orders placed or performed during the hospital encounter of 02/25/15  CBC with Differential  Result Value Ref Range  WBC 7.7 4.0 - 10.5 K/uL   RBC 4.99 3.87 - 5.11 MIL/uL   Hemoglobin 14.3 12.0 - 15.0 g/dL   HCT 42.2 36.0 - 46.0 %   MCV 84.6 78.0 - 100.0 fL   MCH 28.7 26.0 - 34.0 pg   MCHC 33.9 30.0 - 36.0 g/dL   RDW 15.7 (H) 11.5 - 15.5 %   Platelets 225 150 - 400 K/uL   Neutrophils Relative % 73 43 - 77 %   Neutro Abs 5.6 1.7 - 7.7 K/uL   Lymphocytes Relative 22 12 - 46 %   Lymphs Abs 1.7 0.7 - 4.0 K/uL   Monocytes Relative 4 3 - 12 %   Monocytes Absolute 0.3 0.1 - 1.0 K/uL   Eosinophils Relative 1 0 - 5 %   Eosinophils Absolute 0.1 0.0 - 0.7 K/uL   Basophils Relative 0 0 - 1 %   Basophils Absolute 0.0 0.0 - 0.1 K/uL  Comprehensive metabolic panel  Result Value Ref Range   Sodium 134 (L) 135 - 145 mmol/L   Potassium 3.2 (L) 3.5 - 5.1 mmol/L   Chloride 99 96 - 112 mmol/L   CO2 27 19 - 32 mmol/L   Glucose, Bld 121 (H) 70 - 99 mg/dL   BUN 10 6 - 23 mg/dL   Creatinine, Ser 0.98 0.50 - 1.10 mg/dL   Calcium 9.1 8.4 - 10.5 mg/dL   Total Protein 6.9 6.0 - 8.3 g/dL   Albumin 3.7 3.5 - 5.2 g/dL   AST 19 0 - 37 U/L   ALT 20 0 - 35 U/L   Alkaline Phosphatase 69 39 - 117 U/L    Total Bilirubin 0.5 0.3 - 1.2 mg/dL   GFR calc non Af Amer 65 (L) >90 mL/min   GFR calc Af Amer 75 (L) >90 mL/min   Anion gap 8 5 - 15  Lipase, blood  Result Value Ref Range   Lipase 21 11 - 59 U/L  Urinalysis, Routine w reflex microscopic  Result Value Ref Range   Color, Urine AMBER (A) YELLOW   APPearance HAZY (A) CLEAR   Specific Gravity, Urine 1.026 1.005 - 1.030   pH 6.0 5.0 - 8.0   Glucose, UA NEGATIVE NEGATIVE mg/dL   Hgb urine dipstick NEGATIVE NEGATIVE   Bilirubin Urine SMALL (A) NEGATIVE   Ketones, ur 15 (A) NEGATIVE mg/dL   Protein, ur 30 (A) NEGATIVE mg/dL   Urobilinogen, UA 1.0 0.0 - 1.0 mg/dL   Nitrite NEGATIVE NEGATIVE   Leukocytes, UA TRACE (A) NEGATIVE  Urine microscopic-add on  Result Value Ref Range   Squamous Epithelial / LPF MANY (A) RARE   WBC, UA 0-2 <3 WBC/hpf   RBC / HPF 0-2 <3 RBC/hpf   Bacteria, UA FEW (A) RARE   Crystals CA OXALATE CRYSTALS (A) NEGATIVE   Urine-Other MUCOUS PRESENT   Pregnancy, urine  Result Value Ref Range   Preg Test, Ur NEGATIVE NEGATIVE  Imaging Review US Transvaginal Non-ob  02/26/2015   CLINICAL DATA:  Pelvic pain.  EXAM: TRANSABDOMINAL AND TRANSVAGINAL ULTRASOUND OF PELVIS  TECHNIQUE: Both transabdominal and transvaginal ultrasound examinations of the pelvis were performed. Transabdominal technique was performed for global imaging of the pelvis including uterus, ovaries, adnexal regions, and pelvic cul-de-sac. It was necessary to proceed with endovaginal exam following the transabdominal exam to visualize the endometrium and ovaries.  COMPARISON:  CT abdomen and pelvis 02/26/2015.  FINDINGS: Uterus  Measurements: 11 x 5.7 x 5.1 cm, anteverted. Exophytic hypoechoic nodule arising  from the posterior fundus measuring about 3.1 cm diameter consistent with a fibroid. Small nabothian cysts in the cervix.  Endometrium  Thickness: 14.3 mm.  No focal abnormality visualized.  Right ovary  Measurements: 3.6 x 2.2 x 2.4 cm. Normal appearance/no  adnexal mass.  Left ovary  Measurements: 2.4 x 1.6 x 1.3 cm. Normal appearance/no adnexal mass.  Other findings  Minimal free fluid in the pelvis.  IMPRESSION: Uterine fibroid. Minimal free fluid. Endometrium is upper limits of normal thickness. Ovaries are normal without abnormal adnexal mass.   Electronically Signed   By: Lucienne Capers M.D.   On: 02/26/2015 04:22   US Pelvis Complete  02/26/2015   CLINICAL DATA:  Pelvic pain.  EXAM: TRANSABDOMINAL AND TRANSVAGINAL ULTRASOUND OF PELVIS  TECHNIQUE: Both transabdominal and transvaginal ultrasound examinations of the pelvis were performed. Transabdominal technique was performed for global imaging of the pelvis including uterus, ovaries, adnexal regions, and pelvic cul-de-sac. It was necessary to proceed with endovaginal exam following the transabdominal exam to visualize the endometrium and ovaries.  COMPARISON:  CT abdomen and pelvis 02/26/2015.  FINDINGS: Uterus  Measurements: 11 x 5.7 x 5.1 cm, anteverted. Exophytic hypoechoic nodule arising from the posterior fundus measuring about 3.1 cm diameter consistent with a fibroid. Small nabothian cysts in the cervix.  Endometrium  Thickness: 14.3 mm.  No focal abnormality visualized.  Right ovary  Measurements: 3.6 x 2.2 x 2.4 cm. Normal appearance/no adnexal mass.  Left ovary  Measurements: 2.4 x 1.6 x 1.3 cm. Normal appearance/no adnexal mass.  Other findings  Minimal free fluid in the pelvis.  IMPRESSION: Uterine fibroid. Minimal free fluid. Endometrium is upper limits of normal thickness. Ovaries are normal without abnormal adnexal mass.   Electronically Signed   By: Lucienne Capers M.D.   On: 02/26/2015 04:22   Ct Abdomen Pelvis W Contrast  02/26/2015   CLINICAL DATA:  Left lower quadrant pain beginning 2 weeks ago. Diagnosed with an ovarian cyst. Last night began having right lower quadrant pain.  EXAM: CT ABDOMEN AND PELVIS WITH CONTRAST  TECHNIQUE: Multidetector CT imaging of the abdomen and pelvis was  performed using the standard protocol following bolus administration of intravenous contrast.  CONTRAST:  15mL OMNIPAQUE IOHEXOL 300 MG/ML  SOLN  COMPARISON:  Ultrasound pelvis 02/04/2015. CT abdomen and pelvis 02/04/2015.  FINDINGS: Lung bases are clear.  Mild diffuse fatty infiltration of the liver. Gallbladder, spleen, pancreas, inferior vena cava, and retroperitoneal lymph nodes are unremarkable. Calcification of the abdominal aorta without aneurysm. Subcentimeter cysts in the right kidney. No hydronephrosis in either kidney. Small right adrenal gland nodule measuring 17 mm diameter. No change since prior study. 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 Similar appearance on previous study from 09/16/2010. The stomach, small bowel, and colon are unremarkable for degree of distention. No free air or free fluid in the abdomen.  Pelvis: Appendix is normal. Uterus and ovaries are not enlarged. Previous left ovarian cyst has resolved. Bladder wall is not thickened. No free or loculated pelvic fluid collections. No pelvic mass or lymphadenopathy. Diverticula in the sigmoid colon without evidence of diverticulitis. Degenerative changes in the spine. No destructive bone lesions.  IMPRESSION: Mild diffuse fatty infiltration of the liver. Stable appearing right adrenal gland lesion. Interval resolution of previous left ovarian cyst. No abnormal adnexal masses demonstrated today.   Electronically Signed   By: Lucienne Capers M.D.   On: 02/26/2015 02:15   Images viewed by me.  MDM   Final diagnoses:  Abdominal pain, unspecified abdominal location  Pelvic pain in female    Right lower quadrant pain worrisome for possible appendicitis. However, I am suspicious that this might be another ovarian cyst. Evaluation will start with CT of abdomen and pelvis and will follow-up with pelvic ultrasound if no clear etiology found. Of note, laboratory evaluation shows normal WBC and urinalysis significant for  ketonuria.  CT was unremarkable. She was sent for ultrasound of the pelvis which was also unremarkable except for uterine fibroids. She initially had good relief of pain with morphine but pain started to recur. She's given an additional dose of morphine. She is referred back to her primary care provider and her obstetrician for further outpatient workup.  Delora Fuel, MD 64/40/34 [id number]

## 2015-02-25 NOTE — Telephone Encounter (Addendum)
Patient called regarding RLQ pain that started yesterday.  Denies N/V, diarrhea or fever.  H/O of left ovarian cyst that was painful on the left side last week but she says that that pain has ceased and now she has pain on the right side.  Has taken Percocet that is not working.  Discussed possibility of appendicitis.  Discussed signs and symptoms of appendicitis and to go to hospital if symptoms worsen.  A/P:  Pelvic pain.  R/O appendicitis.  Patient to call office in am with update on condition.  Baltazar Najjar MD

## 2015-02-25 NOTE — ED Notes (Signed)
The patient was called to be roomed and she  did not answer.  The patient was called three separated times and she did not answer.

## 2015-02-25 NOTE — Telephone Encounter (Signed)
Patient called- left message- she is in pain and the percocet is not helping she wants to know if she needs to go to the hospital. Dr [name] to call her.

## 2015-02-25 NOTE — ED Notes (Signed)
The patient was called and she did not answer.  She then came to the front desk and asked if she had been called and I advised her that we called and checked the restroom.  The patient was placed back in the waiting room.

## 2015-02-25 NOTE — ED Notes (Signed)
Pt. Reports seen x2 weeks ago and diagnosed with left sided ovarian cyst.  Has been taking percocet for pain but yesterday stopped working - pain is worse. States pain is right lower abdomen, denies left sided abdominal pain. Denies N/V/D. Denies vaginal discharge or urinary symptoms. Took percocet 10-325 x4 hours ago.

## 2015-02-26 ENCOUNTER — Emergency Department (HOSPITAL_COMMUNITY): Payer: Medicaid Other

## 2015-02-26 ENCOUNTER — Encounter (HOSPITAL_COMMUNITY): Payer: Self-pay

## 2015-02-26 LAB — PREGNANCY, URINE: Preg Test, Ur: NEGATIVE

## 2015-02-26 MED ORDER — IOHEXOL 300 MG/ML  SOLN
100.0000 mL | Freq: Once | INTRAMUSCULAR | Status: AC | PRN
  Administered 2015-02-26: 100 mL via INTRAVENOUS

## 2015-02-26 MED ORDER — MORPHINE SULFATE 4 MG/ML IJ SOLN
4.0000 mg | Freq: Once | INTRAMUSCULAR | Status: AC
Start: 2015-02-26 — End: 2015-02-26
  Administered 2015-02-26: 4 mg via INTRAVENOUS
  Filled 2015-02-26: qty 1

## 2015-02-26 NOTE — ED Notes (Signed)
Patient reverbalized discharge instructions and has good knowledge. Will call provider for follow up appointment.

## 2015-02-26 NOTE — Discharge Instructions (Signed)
Your CAT scan and ultrasound showed no evidence of appendicitis and no evidence of ovarian cyst. Cause for your pain is not clear. Please follow-up with your primary care provider and your gynecologist. Return if symptoms worsen.  Abdominal Pain Many things can cause abdominal pain. Usually, abdominal pain is not caused by a disease and will improve without treatment. It can often be observed and treated at home. Your health care provider will do a physical exam and possibly order blood tests and X-rays to help determine the seriousness of your pain. However, in many cases, more time must pass before a clear cause of the pain can be found. Before that point, your health care provider may not know if you need more testing or further treatment. HOME CARE INSTRUCTIONS  Monitor your abdominal pain for any changes. The following actions may help to alleviate any discomfort you are experiencing:  Only take over-the-counter or prescription medicines as directed by your health care provider.  Do not take laxatives unless directed to do so by your health care provider.  Try a clear liquid diet (broth, tea, or water) as directed by your health care provider. Slowly move to a bland diet as tolerated. SEEK MEDICAL CARE IF:  You have unexplained abdominal pain.  You have abdominal pain associated with nausea or diarrhea.  You have pain when you urinate or have a bowel movement.  You experience abdominal pain that wakes you in the night.  You have abdominal pain that is worsened or improved by eating food.  You have abdominal pain that is worsened with eating fatty foods.  You have a fever. SEEK IMMEDIATE MEDICAL CARE IF:   Your pain does not go away within 2 hours.  You keep throwing up (vomiting).  Your pain is felt only in portions of the abdomen, such as the right side or the left lower portion of the abdomen.  You pass bloody or black tarry stools. MAKE SURE YOU:  Understand these  instructions.   Will watch your condition.   Will get help right away if you are not doing well or get worse.  Document Released: 08/19/2005 Document Revised: 11/14/2013 Document Reviewed: 07/19/2013 [facility] Patient Information 2015 [place], [place]. This information is not intended to replace advice given to you by your health care provider. Make sure you discuss any questions you have with your health care provider.

## 2015-02-26 NOTE — ED Notes (Signed)
Patient has completed drinking contrast for CT and CT was phoned with update.

## 2015-02-26 NOTE — ED Notes (Signed)
Patient transported to Ultrasound 

## 2015-03-04 ENCOUNTER — Other Ambulatory Visit (HOSPITAL_COMMUNITY): Payer: Self-pay | Admitting: Internal Medicine

## 2015-03-04 DIAGNOSIS — Z1231 Encounter for screening mammogram for malignant neoplasm of breast: Secondary | ICD-10-CM

## 2015-03-14 ENCOUNTER — Telehealth: Payer: Self-pay | Admitting: *Deleted

## 2015-03-14 NOTE — Telephone Encounter (Signed)
Placed call to pt to make her aware that her Tinidazole Rx has been approved by insurance.  Pt advised to follow up with pharmacy to pick up Rx.

## 2015-03-15 ENCOUNTER — Telehealth: Payer: Self-pay | Admitting: *Deleted

## 2015-03-15 NOTE — Telephone Encounter (Signed)
Her blood pressure was elevated probably because of chronic pain during her last visit. The questions regarding her obstructive sleep apnea probably need to be referred to her primary care physician.

## 2015-03-15 NOTE — Telephone Encounter (Signed)
Dr Mable Paris sent a form requesting information.   1. Is her hypertension under good control?  2. Does the patient have mild, moderate, or severe sleep apnea?  3. Does patient have asthma, COPD, or other pulmonary disorder?   The planned dental treatment is fillings, extractions and/or gum treatment.

## 2015-03-18 NOTE — Telephone Encounter (Signed)
Information faxed back to Dr Mable Paris.

## 2015-03-28 ENCOUNTER — Emergency Department (HOSPITAL_COMMUNITY): Payer: Medicaid Other

## 2015-03-28 ENCOUNTER — Emergency Department (HOSPITAL_COMMUNITY)
Admission: EM | Admit: 2015-03-28 | Discharge: 2015-03-28 | Disposition: A | Discharge status: Home / Self Care | Payer: Medicaid Other | Attending: Emergency Medicine | Admitting: Emergency Medicine

## 2015-03-28 ENCOUNTER — Encounter (HOSPITAL_COMMUNITY): Payer: Self-pay | Admitting: Emergency Medicine

## 2015-03-28 DIAGNOSIS — Z91040 Latex allergy status: Secondary | ICD-10-CM | POA: Insufficient documentation

## 2015-03-28 DIAGNOSIS — I10 Essential (primary) hypertension: Secondary | ICD-10-CM | POA: Insufficient documentation

## 2015-03-28 DIAGNOSIS — Z79899 Other long term (current) drug therapy: Secondary | ICD-10-CM | POA: Insufficient documentation

## 2015-03-28 DIAGNOSIS — R102 Pelvic and perineal pain: Secondary | ICD-10-CM | POA: Diagnosis not present

## 2015-03-28 DIAGNOSIS — I252 Old myocardial infarction: Secondary | ICD-10-CM | POA: Diagnosis not present

## 2015-03-28 DIAGNOSIS — Z720 Tobacco use: Secondary | ICD-10-CM | POA: Diagnosis not present

## 2015-03-28 DIAGNOSIS — Z9889 Other specified postprocedural states: Secondary | ICD-10-CM | POA: Insufficient documentation

## 2015-03-28 DIAGNOSIS — E785 Hyperlipidemia, unspecified: Secondary | ICD-10-CM | POA: Diagnosis not present

## 2015-03-28 DIAGNOSIS — Z9861 Coronary angioplasty status: Secondary | ICD-10-CM | POA: Insufficient documentation

## 2015-03-28 DIAGNOSIS — Z8739 Personal history of other diseases of the musculoskeletal system and connective tissue: Secondary | ICD-10-CM | POA: Diagnosis not present

## 2015-03-28 DIAGNOSIS — J45909 Unspecified asthma, uncomplicated: Secondary | ICD-10-CM | POA: Diagnosis not present

## 2015-03-28 DIAGNOSIS — R112 Nausea with vomiting, unspecified: Secondary | ICD-10-CM | POA: Diagnosis not present

## 2015-03-28 DIAGNOSIS — Z7951 Long term (current) use of inhaled steroids: Secondary | ICD-10-CM | POA: Insufficient documentation

## 2015-03-28 DIAGNOSIS — I2510 Atherosclerotic heart disease of native coronary artery without angina pectoris: Secondary | ICD-10-CM | POA: Diagnosis not present

## 2015-03-28 DIAGNOSIS — Z7982 Long term (current) use of aspirin: Secondary | ICD-10-CM | POA: Insufficient documentation

## 2015-03-28 DIAGNOSIS — R1030 Lower abdominal pain, unspecified: Secondary | ICD-10-CM | POA: Diagnosis present

## 2015-03-28 LAB — BASIC METABOLIC PANEL
Anion gap: 9 (ref 5–15)
BUN: 10 mg/dL (ref 6–20)
CO2: 27 mmol/L (ref 22–32)
Calcium: 9.3 mg/dL (ref 8.9–10.3)
Chloride: 101 mmol/L (ref 101–111)
Creatinine, Ser: 0.88 mg/dL (ref 0.44–1.00)
GFR calc Af Amer: >60 mL/min (ref >60)
GFR calc non Af Amer: >60 mL/min (ref >60)
Glucose, Bld: 112 mg/dL — ABNORMAL HIGH (ref 70–99)
Potassium: 3.6 mmol/L (ref 3.5–5.1)
Sodium: 137 mmol/L (ref 135–145)

## 2015-03-28 LAB — CBC WITH DIFFERENTIAL/PLATELET
Basophils Absolute: 0 10*3/uL (ref 0.0–0.1)
Basophils Relative: 0 % (ref 0–1)
Eosinophils Absolute: 0.1 10*3/uL (ref 0.0–0.7)
Eosinophils Relative: 1 % (ref 0–5)
HCT: 41.5 % (ref 36.0–46.0)
Hemoglobin: 14.4 g/dL (ref 12.0–15.0)
Lymphocytes Relative: 24 % (ref 12–46)
Lymphs Abs: 1.9 10*3/uL (ref 0.7–4.0)
MCH: 29.2 pg (ref 26.0–34.0)
MCHC: 34.7 g/dL (ref 30.0–36.0)
MCV: 84.2 fL (ref 78.0–100.0)
Monocytes Absolute: 0.4 10*3/uL (ref 0.1–1.0)
Monocytes Relative: 5 % (ref 3–12)
Neutro Abs: 5.3 10*3/uL (ref 1.7–7.7)
Neutrophils Relative %: 70 % (ref 43–77)
Platelets: 218 10*3/uL (ref 150–400)
RBC: 4.93 MIL/uL (ref 3.87–5.11)
RDW: 15.8 % — ABNORMAL HIGH (ref 11.5–15.5)
WBC: 7.7 10*3/uL (ref 4.0–10.5)

## 2015-03-28 LAB — URINE MICROSCOPIC-ADD ON

## 2015-03-28 LAB — URINALYSIS, ROUTINE W REFLEX MICROSCOPIC
Bilirubin Urine: NEGATIVE
Glucose, UA: NEGATIVE mg/dL
Ketones, ur: NEGATIVE mg/dL
Leukocytes, UA: NEGATIVE
Nitrite: NEGATIVE
Protein, ur: 100 mg/dL — AB
Specific Gravity, Urine: 1.01 (ref 1.005–1.030)
Urobilinogen, UA: 0.2 mg/dL (ref 0.0–1.0)
pH: 7.5 (ref 5.0–8.0)

## 2015-03-28 LAB — WET PREP, GENITAL
Clue Cells Wet Prep HPF POC: NONE SEEN
Trich, Wet Prep: NONE SEEN
Yeast Wet Prep HPF POC: NONE SEEN

## 2015-03-28 LAB — POC URINE PREG, ED: Preg Test, Ur: NEGATIVE

## 2015-03-28 MED ORDER — MORPHINE SULFATE 4 MG/ML IJ SOLN
6.0000 mg | Freq: Once | INTRAMUSCULAR | Status: AC
  Administered 2015-03-28: 6 mg via INTRAVENOUS
  Filled 2015-03-28: qty 2

## 2015-03-28 MED ORDER — OXYCODONE-ACETAMINOPHEN 5-325 MG PO TABS: 1.0000 | ORAL_TABLET | Freq: Four times a day (QID) | ORAL | Status: DC | PRN

## 2015-03-28 MED ORDER — ONDANSETRON HCL 4 MG/2ML IJ SOLN
4.0000 mg | Freq: Once | INTRAMUSCULAR | Status: AC
  Administered 2015-03-28: 4 mg via INTRAVENOUS
  Filled 2015-03-28: qty 2

## 2015-03-28 NOTE — ED Notes (Signed)
Pt states she had a sudden onset of serve LLQ pain and vomited once. Pt states she is nauseous at this time denies any diarrhea.

## 2015-03-28 NOTE — ED Provider Notes (Signed)
CSN: [id number]     Arrival date & time 03/28/15  1139 History   First MD Initiated Contact with Patient 03/28/15 1156     Chief Complaint  Patient presents with  . Abdominal Pain     (Consider location/radiation/quality/duration/timing/severity/associated sxs/prior Treatment) HPI Comments: Patients with history of intermittent pelvic pain, ovarian cyst, fibroid -- presents with acute onset of left groin, vaginal, and lower abdominal pain beginning at 11:30 AM. Pain described as 'cramping'. It comes and goes in waves. It has been associated with vomiting. No fever, diarrhea, urinary symptoms. Last menstrual period was approximately 2 weeks ago. Patient states that she had 2 periods during the month of April. No current vaginal bleeding or discharge. Patient is sexually active with a new partner. No treatments prior to arrival. Course is constant. Nothing makes symptoms better. Palpation makes the pain worse. She has been taking Percocet at home which helps.   The history is provided by the patient and medical records.    Past Medical History  Diagnosis Date  . Coronary artery disease   . Sciatica   . Hypertension   . Myocardial infarct   . Asthma   . Dyslipidemia   . Tobacco abuse   . Chest pain   . Collagen vascular disease    Past Surgical History  Procedure Laterality Date  . Cesarean section    . Dilation and curettage, diagnostic / therapeutic    . Nm myocar perf wall motion  08/2009    persantine myoview - normal perfusion in all regions, perfusion defect in anterior region (breast attenuation), EF 52%, low risk scan  . Cardiac catheterization  10/09/2003    [place], [place]) - LAD with 30% prox narrowing, 50% stenosis in mid-portion of PLA; RCA with 40% narrowing proximally ([name], [name])  . Cardiac catheterization  10/10/2007    70% stenosis in first septal perforator branch of LAD, 60-70% narrowing in mid LAD, 20% narrowing in mid AV groove Cfx  with 80% diffuse narrowing in small distal marginal, total occlusion of mid RCA with L to R collaterals, 90% stenosis diffusely in prox branch of RCA followed by 70% stenosis in secondary curve & 80% stenosis in small marginal branch ([name])  . Coronary angioplasty with stent placement  10/31/2007    PCI of distal Cfx with 2.25x71mm Taxus Adam DES, 60% narrowing of mid LAD ([name])  . Cardiac catheterization  05/28/2008    normal L main, RCA with 100% prox lesion w/distal filling from LAD collaterals, LAD with 20% prox tubular lesion/ 40% mid LAD lesion/previous stent patent ([name])  . Cardiac catheterization  09/15/2009    discrete 100% osital RCA lesion, 50% prox LAD lesion, non-obstructive disease in all coronaries ([name])  . Coronary angioplasty with stent placement  06/11/2011    PCI of prox-mid LAD with 3x20mm DES Resolute ([name])   Family History  Problem Relation Age of Onset  . Leukemia Mother   . Clotting disorder Father     blood clot  . Hypertension Sister   . Stroke Sister 75  . Bleeding Disorder Son     ITP "free bleeding disorder"   History  Substance Use Topics  . Smoking status: Current Every Day Smoker -- 0.50 packs/day    Types: Cigarettes  . Smokeless tobacco: Not on file  . Alcohol Use: No   OB History    No data available     Review  of Systems  Constitutional: Negative for fever.  HENT: Negative for rhinorrhea and sore throat.   Eyes: Negative for redness.  Respiratory: Negative for cough.   Cardiovascular: Negative for chest pain.  Gastrointestinal: Positive for nausea, vomiting and abdominal pain. Negative for diarrhea.  Genitourinary: Positive for pelvic pain. Negative for dysuria, frequency, hematuria, flank pain, vaginal bleeding and vaginal discharge.  Musculoskeletal: Negative for myalgias.  Skin: Negative for rash.  Neurological: Negative for headaches.      Allergies  Other; Dilaudid; and Latex  Home  Medications   Prior to Admission medications   Medication Sig Start Date End Date Taking? Authorizing Provider  acyclovir (ZOVIRAX) 400 MG tablet Take 400 mg by mouth daily.    Historical Provider, MD  albuterol (PROVENTIL HFA;VENTOLIN HFA) 108 (90 BASE) MCG/ACT inhaler Inhale 2 puffs into the lungs every 6 (six) hours as needed for wheezing or shortness of breath.    Historical Provider, MD  amLODipine (NORVASC) 5 MG tablet Take 10 mg by mouth daily.    Historical Provider, MD  aspirin EC 325 MG tablet Take 325 mg by mouth daily.    Historical Provider, MD  atorvastatin (LIPITOR) 40 MG tablet Take 1 tablet (40 mg total) by mouth daily. 01/04/14   [name], MD  clopidogrel (PLAVIX) 75 MG tablet Take 1 tablet (75 mg total) by mouth daily with breakfast. 01/04/14   [name], MD  dicyclomine (BENTYL) 20 MG tablet Take 1 tablet (20 mg total) by mouth every 8 (eight) hours. 02/08/15   [name], MD  fluticasone (FLOVENT HFA) 220 MCG/ACT inhaler Inhale 1 puff into the lungs 2 (two) times daily.    Historical Provider, MD  fluticasone-salmeterol (ADVAIR HFA) 115-21 MCG/ACT inhaler Inhale 2 puffs into the lungs 2 (two) times daily. 01/04/14   [name], MD  lisinopril-hydrochlorothiazide (PRINZIDE,ZESTORETIC) 20-12.5 MG per tablet Take 2 tablets by mouth daily.    Historical Provider, MD  nitroGLYCERIN (NITROSTAT) 0.4 MG SL tablet Place 0.4 mg under the tongue every 5 (five) minutes as needed for chest pain.    Historical Provider, MD  oxyCODONE-acetaminophen (PERCOCET) 10-325 MG per tablet Take 1 tablet by mouth every 6 (six) hours as needed for pain. 02/21/15   [name], MD  oxyCODONE-acetaminophen (PERCOCET/ROXICET) 5-325 MG per tablet Take 1 tablet by mouth every 4 (four) hours as needed. 02/04/15   Larene Pickett, PA-C  zolpidem (AMBIEN) 10 MG tablet Take 10 mg by mouth at bedtime as needed for sleep.    Historical Provider, MD   BP 134/116 mmHg  Pulse 69  Temp(Src) 97.7  F (36.5 C) (Oral)  Resp 16  Ht 5\' 1"  (1.549 m)  Wt 201 lb (91.173 kg)  BMI 38.00 kg/m2  SpO2 100%  LMP 03/13/2015   Physical Exam  Constitutional: She appears well-developed and well-nourished.  HENT:  Head: Normocephalic and atraumatic.  Eyes: Conjunctivae are normal. Right eye exhibits no discharge. Left eye exhibits no discharge.  Neck: Normal range of motion. Neck supple.  Cardiovascular: Normal rate, regular rhythm and normal heart sounds.   Pulmonary/Chest: Effort normal and breath sounds normal.  Abdominal: Soft. She exhibits no distension. There is tenderness. There is no rebound and no guarding.  There is generalized tenderness all over the left upper and lower side of the abdomen. Not able to localize. No distention. No rebound or guarding.  Genitourinary: There is tenderness on the right labia. There is no rash or lesion on the right  labia. There is tenderness on the left labia. There is no rash or lesion on the left labia. Uterus is tender. Uterus is not enlarged. Cervix exhibits no motion tenderness and no discharge. Right adnexum displays no mass, no tenderness and no fullness. Left adnexum displays no mass, no tenderness and no fullness. There is tenderness in the vagina. No erythema or bleeding in the vagina. No foreign body around the vagina. No vaginal discharge found.  Generalized tenderness without localization, entire vagina with insertion of speculum and with insertion of fingers on bimanual. No masses palpated.   Neurological: She is alert.  Skin: Skin is warm and dry.  Psychiatric: She has a normal mood and affect.  Nursing note and vitals reviewed.   ED Course  Procedures (including critical care time) Labs Review Labs Reviewed  BASIC METABOLIC PANEL - Abnormal; Notable for the following:    Glucose, Bld 112 (*)    All other components within normal limits  CBC WITH DIFFERENTIAL/PLATELET - Abnormal; Notable for the following:    RDW 15.8 (*)    All other  components within normal limits  URINALYSIS, ROUTINE W REFLEX MICROSCOPIC - Abnormal; Notable for the following:    Hgb urine dipstick TRACE (*)    Protein, ur 100 (*)    All other components within normal limits  URINE MICROSCOPIC-ADD ON - Abnormal; Notable for the following:    Squamous Epithelial / LPF FEW (*)    All other components within normal limits  WET PREP, GENITAL  POC URINE PREG, ED  GC/CHLAMYDIA PROBE AMP ()    Imaging Review US Transvaginal Non-ob  03/28/2015   CLINICAL DATA:  Left lower quadrant pain .  EXAM: TRANSABDOMINAL AND TRANSVAGINAL  TECHNIQUE: Both transabdominal and transvaginal ultrasound examinations of the pelvis were performed. Transabdominal technique was performed for global imaging of the pelvis including uterus, ovaries, adnexal regions, and pelvic cul-de-sac.  It was necessary to proceed with endovaginal exam following the transabdominal exam to visualize the uterus and ovaries. Color and duplex Doppler ultrasound was utilized to evaluate blood flow to the ovaries.  COMPARISON:  None.  FINDINGS: Uterus  Measurements: 9.6 x 4.9 x 4.8 cm. 3.2 cm posterior fundal fibroid.  Endometrium  Thickness: 5.3 mm.  No focal abnormality visualized.  Right ovary  Measurements: 2.5 x 2.0 x 2.4 cm. Normal appearance/no adnexal mass.  Left ovary  Measurements: 3.7 x 2.1 x 3.5 cm. Normal appearance/no adnexal mass.  Pulsed Doppler evaluation of both ovaries demonstrates normal low-resistance arterial and venous waveforms.  Other findings  No free fluid.  IMPRESSION: 1. 3.2 cm uterine fibroid. 2. Exam otherwise normal.  No evidence of torsion.  ULTRASOUND OF PELVIS  DOPPLER ULTRASOUND OF OVARIES   Electronically Signed   By: Winona   On: 03/28/2015 14:23   US Pelvis Complete  03/28/2015   CLINICAL DATA:  Left lower quadrant pain .  EXAM: TRANSABDOMINAL AND TRANSVAGINAL  TECHNIQUE: Both transabdominal and transvaginal ultrasound examinations of the pelvis were  performed. Transabdominal technique was performed for global imaging of the pelvis including uterus, ovaries, adnexal regions, and pelvic cul-de-sac.  It was necessary to proceed with endovaginal exam following the transabdominal exam to visualize the uterus and ovaries. Color and duplex Doppler ultrasound was utilized to evaluate blood flow to the ovaries.  COMPARISON:  None.  FINDINGS: Uterus  Measurements: 9.6 x 4.9 x 4.8 cm. 3.2 cm posterior fundal fibroid.  Endometrium  Thickness: 5.3 mm.  No focal abnormality visualized.  Right ovary  Measurements: 2.5 x 2.0 x 2.4 cm. Normal appearance/no adnexal mass.  Left ovary  Measurements: 3.7 x 2.1 x 3.5 cm. Normal appearance/no adnexal mass.  Pulsed Doppler evaluation of both ovaries demonstrates normal low-resistance arterial and venous waveforms.  Other findings  No free fluid.  IMPRESSION: 1. 3.2 cm uterine fibroid. 2. Exam otherwise normal.  No evidence of torsion.  ULTRASOUND OF PELVIS  DOPPLER ULTRASOUND OF OVARIES   Electronically Signed   By: Marcello Moores  Register   On: 03/28/2015 14:23   [place] Art/ven Flow Abd Pelv Doppler  03/28/2015   CLINICAL DATA:  Left lower quadrant pain .  EXAM: TRANSABDOMINAL AND TRANSVAGINAL  TECHNIQUE: Both transabdominal and transvaginal ultrasound examinations of the pelvis were performed. Transabdominal technique was performed for global imaging of the pelvis including uterus, ovaries, adnexal regions, and pelvic cul-de-sac.  It was necessary to proceed with endovaginal exam following the transabdominal exam to visualize the uterus and ovaries. Color and duplex Doppler ultrasound was utilized to evaluate blood flow to the ovaries.  COMPARISON:  None.  FINDINGS: Uterus  Measurements: 9.6 x 4.9 x 4.8 cm. 3.2 cm posterior fundal fibroid.  Endometrium  Thickness: 5.3 mm.  No focal abnormality visualized.  Right ovary  Measurements: 2.5 x 2.0 x 2.4 cm. Normal appearance/no adnexal mass.  Left ovary  Measurements: 3.7 x 2.1 x 3.5 cm. Normal  appearance/no adnexal mass.  Pulsed Doppler evaluation of both ovaries demonstrates normal low-resistance arterial and venous waveforms.  Other findings  No free fluid.  IMPRESSION: 1. 3.2 cm uterine fibroid. 2. Exam otherwise normal.  No evidence of torsion.  ULTRASOUND OF PELVIS  DOPPLER ULTRASOUND OF OVARIES   Electronically Signed   By: Marcello Moores  Register   On: 03/28/2015 14:23     EKG Interpretation None       12:21 PM Patient seen and examined. Work-up initiated. Medications ordered. Will perform pelvic exam. Transvaginal ultrasound ordered. Feel given the acute onset of severe pain with vomiting necessitates rule out for torsion.  Vital signs reviewed and are as follows: BP 134/116 mmHg  Pulse 69  Temp(Src) 97.7 F (36.5 C) (Oral)  Resp 16  Ht 5\' 1"  (1.549 m)  Wt 201 lb (91.173 kg)  BMI 38.00 kg/m2  SpO2 100%  LMP 03/13/2015  4:18 PM Pelvic exam performed by McVey PA-S with nurse chaperone under my direct supervision. Findings as above.   Awaiting wet prep.   4:32 PM Patient informed of wet prep results. She is much more comfortable, drinking soda in the room.   Will discharge to home with GYN follow-up. Patient states she will call her doctor today or tomorrow. She has a scheduled appointment in one week. Will give pain medicine for home.  Patient counseled on use of narcotic pain medications. Counseled not to combine these medications with others containing tylenol. Urged not to drink alcohol, drive, or perform any other activities that requires focus while taking these medications. The patient verbalizes understanding and agrees with the plan.   The patient was urged to return to the Emergency Department immediately with worsening of current symptoms, worsening abdominal pain, persistent vomiting, blood noted in stools, fever, or any other concerns. The patient verbalized understanding.     MDM   Final diagnoses:  Adnexal pain  Vaginal pain   Patient with several  months of pelvic pain which has become recurrent. Patient presented today with left-sided pelvic pain, severe pain, with associated vomiting. Normal transvaginal ultrasound without signs of ovarian  torsion. Patient will need continued GYN f/u for further eval. Do not suspect soft tissue abscess given no fever, normal WBC, duration of symptoms. Patient appears well, non-toxic.   No dangerous or life-threatening conditions suspected or identified by history, physical exam, and by work-up. No indications for hospitalization identified.      Carlisle Cater, PA-C 03/28/15 1643  Charlesetta Shanks, MD 04/03/15 [phone]

## 2015-03-28 NOTE — Discharge Instructions (Signed)
Please read and follow all provided instructions.  Your diagnoses today include:  1. Vaginal pain   2. Adnexal pain     Tests performed today include:  Blood counts and electrolytes  Blood tests to check kidney function  Urine test to look for infection and pregnancy (in women)  Wet prep - does not show vaginal infection  Vital signs. See below for your results today.   Medications prescribed:   Percocet (oxycodone/acetaminophen) - narcotic pain medication  DO NOT drive or perform any activities that require you to be awake and alert because this medicine can make you drowsy. BE VERY CAREFUL not to take multiple medicines containing Tylenol (also called acetaminophen). Doing so can lead to an overdose which can damage your liver and cause liver failure and possibly death.  Take any prescribed medications only as directed.  Home care instructions:   Follow any educational materials contained in this packet.  Follow-up instructions: Please follow-up with your GYN in the next 7 days for further evaluation of your symptoms.    Return instructions:  SEEK IMMEDIATE MEDICAL ATTENTION IF:  The pain does not go away or becomes severe   A temperature above 101F develops   Repeated vomiting occurs (multiple episodes)   The pain becomes localized to portions of the abdomen. The right side could possibly be appendicitis. In an adult, the left lower portion of the abdomen could be colitis or diverticulitis.   Blood is being passed in stools or vomit (bright red or black tarry stools)   You develop chest pain, difficulty breathing, dizziness or fainting, or become confused, poorly responsive, or inconsolable (young children)  If you have any other emergent concerns regarding your health  Additional Information: Abdominal (belly) pain can be caused by many things. Your caregiver performed an examination and possibly ordered blood/urine tests and imaging (CT scan, x-rays,  ultrasound). Many cases can be observed and treated at home after initial evaluation in the emergency department. Even though you are being discharged home, abdominal pain can be unpredictable. Therefore, you need a repeated exam if your pain does not resolve, returns, or worsens. Most patients with abdominal pain don't have to be admitted to the hospital or have surgery, but serious problems like appendicitis and gallbladder attacks can start out as nonspecific pain. Many abdominal conditions cannot be diagnosed in one visit, so follow-up evaluations are very important.  Your vital signs today were: BP 161/101 mmHg   Pulse 75   Temp(Src) 97.7 F (36.5 C) (Oral)   Resp 18   Ht 5\' 1"  (1.549 m)   Wt 201 lb (91.173 kg)   BMI 38.00 kg/m2   SpO2 99%   LMP 03/13/2015 If your blood pressure (bp) was elevated above 135/85 this visit, please have this repeated by your doctor within one month. --------------

## 2015-03-28 NOTE — ED Notes (Signed)
Pt states she has not taken BP meds today.

## 2015-03-29 LAB — GC/CHLAMYDIA PROBE AMP (~~LOC~~) NOT AT ARMC
Chlamydia: NEGATIVE
Neisseria Gonorrhea: NEGATIVE

## 2015-04-04 ENCOUNTER — Ambulatory Visit (INDEPENDENT_AMBULATORY_CARE_PROVIDER_SITE_OTHER): Payer: Medicaid Other | Admitting: Obstetrics

## 2015-04-04 ENCOUNTER — Encounter: Payer: Self-pay | Admitting: Obstetrics

## 2015-04-04 VITALS — BP 178/111 | HR 60 | Wt 200.0 lb

## 2015-04-04 DIAGNOSIS — R102 Pelvic and perineal pain: Secondary | ICD-10-CM

## 2015-04-04 MED ORDER — OXYCODONE HCL 10 MG PO TABS: 10.0000 mg | ORAL_TABLET | Freq: Four times a day (QID) | ORAL | Status: DC | PRN

## 2015-04-05 ENCOUNTER — Encounter: Payer: Self-pay | Admitting: Obstetrics

## 2015-04-05 NOTE — Progress Notes (Signed)
Patient ID: Tracey Manning, female   DOB: 08-15-1961, 54 y.o.   MRN: [id number]  Chief Complaint  Patient presents with  . Follow-up    pelvic pain    HPI Tracey Manning is a 54 y.o. female.  H/O pelvic pain.  Denies N/V, diarrhea, constipation.  Ultrasound done and was WNL's.  HPI  Past Medical History  Diagnosis Date  . Coronary artery disease   . Sciatica   . Hypertension   . Myocardial infarct   . Asthma   . Dyslipidemia   . Tobacco abuse   . Chest pain   . Collagen vascular disease     Past Surgical History  Procedure Laterality Date  . Cesarean section    . Dilation and curettage, diagnostic / therapeutic    . Nm myocar perf wall motion  08/2009    persantine myoview - normal perfusion in all regions, perfusion defect in anterior region (breast attenuation), EF 52%, low risk scan  . Cardiac catheterization  10/09/2003    [place], [place]) - LAD with 30% prox narrowing, 50% stenosis in mid-portion of PLA; RCA with 40% narrowing proximally ([name], [name])  . Cardiac catheterization  10/10/2007    70% stenosis in first septal perforator branch of LAD, 60-70% narrowing in mid LAD, 20% narrowing in mid AV groove Cfx with 80% diffuse narrowing in small distal marginal, total occlusion of mid RCA with L to R collaterals, 90% stenosis diffusely in prox branch of RCA followed by 70% stenosis in secondary curve & 80% stenosis in small marginal branch ([name])  . Coronary angioplasty with stent placement  10/31/2007    PCI of distal Cfx with 2.25x89mm Taxus Adam DES, 60% narrowing of mid LAD ([name])  . Cardiac catheterization  05/28/2008    normal L main, RCA with 100% prox lesion w/distal filling from LAD collaterals, LAD with 20% prox tubular lesion/ 40% mid LAD lesion/previous stent patent ([name])  . Cardiac catheterization  09/15/2009    discrete 100% osital RCA lesion, 50% prox LAD lesion, non-obstructive disease in all coronaries (Dr.  [name])  . Coronary angioplasty with stent placement  06/11/2011    PCI of prox-mid LAD with 3x28mm DES Resolute ([name])    Family History  Problem Relation Age of Onset  . Leukemia Mother   . Clotting disorder Father     blood clot  . Hypertension Sister   . Stroke Sister 20  . Bleeding Disorder Son     ITP "free bleeding disorder"    Social History History  Substance Use Topics  . Smoking status: Current Every Day Smoker -- 0.50 packs/day    Types: Cigarettes  . Smokeless tobacco: Not on file  . Alcohol Use: No    Allergies  Allergen Reactions  . Other Anaphylaxis and Hives    Fresh strawberries (throat swelled)  . Dilaudid [Hydromorphone Hcl] Nausea And Vomiting  . Latex Swelling    No reaction with bandaids    Current Outpatient Prescriptions  Medication Sig Dispense Refill  . acyclovir (ZOVIRAX) 400 MG tablet Take 400 mg by mouth daily.    Marland Kitchen albuterol (PROVENTIL HFA;VENTOLIN HFA) 108 (90 BASE) MCG/ACT inhaler Inhale 2 puffs into the lungs every 6 (six) hours as needed for wheezing or shortness of breath.    Marland Kitchen amLODipine (NORVASC) 5 MG tablet Take 10 mg by mouth daily.    Marland Kitchen aspirin EC 325 MG tablet Take 325 mg by  mouth daily.    Marland Kitchen atorvastatin (LIPITOR) 40 MG tablet Take 1 tablet (40 mg total) by mouth daily. 30 tablet 2  . clopidogrel (PLAVIX) 75 MG tablet Take 1 tablet (75 mg total) by mouth daily with breakfast. 30 tablet 2  . dicyclomine (BENTYL) 20 MG tablet Take 1 tablet (20 mg total) by mouth every 8 (eight) hours. 42 tablet 1  . fluticasone (FLOVENT HFA) 220 MCG/ACT inhaler Inhale 1 puff into the lungs 2 (two) times daily as needed (wheezing).     . fluticasone-salmeterol (ADVAIR HFA) 115-21 MCG/ACT inhaler Inhale 2 puffs into the lungs 2 (two) times daily. 1 Inhaler 12  . lisinopril-hydrochlorothiazide (PRINZIDE,ZESTORETIC) 20-12.5 MG per tablet Take 2 tablets by mouth daily.    . nitroGLYCERIN (NITROSTAT) 0.4 MG SL tablet Place 0.4 mg under the  tongue every 5 (five) minutes as needed for chest pain.    . Oxycodone HCl 10 MG TABS Take 1 tablet (10 mg total) by mouth every 6 (six) hours as needed. 40 tablet 0  . oxyCODONE-acetaminophen (PERCOCET/ROXICET) 5-325 MG per tablet Take 1-2 tablets by mouth every 6 (six) hours as needed for severe pain. 10 tablet 0  . zolpidem (AMBIEN) 10 MG tablet Take 10 mg by mouth at bedtime as needed for sleep.     No current facility-administered medications for this visit.    Review of Systems Review of Systems Constitutional: negative for fatigue and weight loss Respiratory: negative for cough and wheezing Cardiovascular: negative for chest pain, fatigue and palpitations Gastrointestinal: negative for abdominal pain and change in bowel habits Genitourinary:pelvic pain Integument/breast: negative for nipple discharge Musculoskeletal:negative for myalgias Neurological: negative for gait problems and tremors Behavioral/Psych: negative for abusive relationship, depression Endocrine: negative for temperature intolerance     Blood pressure 178/111, pulse 60, weight 200 lb (90.719 kg), last menstrual period 03/13/2015.  Physical Exam Physical Exam General:   alert  Skin:   no rash or abnormalities  Lungs:   clear to auscultation bilaterally  Heart:   regular rate and rhythm, S1, S2 normal, no murmur, click, rub or gallop  Breasts:   normal without suspicious masses, skin or nipple changes or axillary nodes  Abdomen:  normal findings: no organomegaly, soft, non-tender and no hernia  Pelvis:  External genitalia: normal general appearance Urinary system: urethral meatus normal and bladder without fullness, nontender Vaginal: normal without tenderness, induration or masses Cervix: normal appearance Adnexa: normal bimanual exam Uterus: anteverted and non-tender, normal size     Data Reviewed ultrasound  Assessment     Pelvic pain.  ? Etiology.  R/O GI.     Plan  Consider referral to GI      No orders of the defined types were placed in this encounter.   Meds ordered this encounter  Medications  . Oxycodone HCl 10 MG TABS    Sig: Take 1 tablet (10 mg total) by mouth every 6 (six) hours as needed.    Dispense:  40 tablet    Refill:  0

## 2015-04-10 ENCOUNTER — Ambulatory Visit (HOSPITAL_COMMUNITY)
Admission: RE | Admit: 2015-04-10 | Discharge: 2015-04-10 | Disposition: A | Discharge status: Home / Self Care | Payer: Medicaid Other | Source: Ambulatory Visit | Attending: Internal Medicine | Admitting: Internal Medicine

## 2015-04-10 DIAGNOSIS — Z1231 Encounter for screening mammogram for malignant neoplasm of breast: Secondary | ICD-10-CM | POA: Diagnosis present

## 2015-04-27 ENCOUNTER — Encounter (HOSPITAL_COMMUNITY): Payer: Self-pay | Admitting: Emergency Medicine

## 2015-04-27 ENCOUNTER — Emergency Department (HOSPITAL_COMMUNITY)
Admission: EM | Admit: 2015-04-27 | Discharge: 2015-04-27 | Disposition: A | Discharge status: Home / Self Care | Payer: Medicaid Other | Attending: Emergency Medicine | Admitting: Emergency Medicine

## 2015-04-27 DIAGNOSIS — J45909 Unspecified asthma, uncomplicated: Secondary | ICD-10-CM | POA: Diagnosis not present

## 2015-04-27 DIAGNOSIS — Z9861 Coronary angioplasty status: Secondary | ICD-10-CM | POA: Insufficient documentation

## 2015-04-27 DIAGNOSIS — I2510 Atherosclerotic heart disease of native coronary artery without angina pectoris: Secondary | ICD-10-CM | POA: Diagnosis not present

## 2015-04-27 DIAGNOSIS — Z9889 Other specified postprocedural states: Secondary | ICD-10-CM | POA: Insufficient documentation

## 2015-04-27 DIAGNOSIS — Z720 Tobacco use: Secondary | ICD-10-CM | POA: Insufficient documentation

## 2015-04-27 DIAGNOSIS — E785 Hyperlipidemia, unspecified: Secondary | ICD-10-CM | POA: Insufficient documentation

## 2015-04-27 DIAGNOSIS — Z8739 Personal history of other diseases of the musculoskeletal system and connective tissue: Secondary | ICD-10-CM | POA: Diagnosis not present

## 2015-04-27 DIAGNOSIS — I252 Old myocardial infarction: Secondary | ICD-10-CM | POA: Insufficient documentation

## 2015-04-27 DIAGNOSIS — I10 Essential (primary) hypertension: Secondary | ICD-10-CM | POA: Diagnosis not present

## 2015-04-27 DIAGNOSIS — Z7951 Long term (current) use of inhaled steroids: Secondary | ICD-10-CM | POA: Insufficient documentation

## 2015-04-27 DIAGNOSIS — Z91040 Latex allergy status: Secondary | ICD-10-CM | POA: Diagnosis not present

## 2015-04-27 DIAGNOSIS — Z79899 Other long term (current) drug therapy: Secondary | ICD-10-CM | POA: Insufficient documentation

## 2015-04-27 DIAGNOSIS — L02214 Cutaneous abscess of groin: Secondary | ICD-10-CM | POA: Diagnosis not present

## 2015-04-27 DIAGNOSIS — Z7982 Long term (current) use of aspirin: Secondary | ICD-10-CM | POA: Diagnosis not present

## 2015-04-27 MED ORDER — LIDOCAINE-EPINEPHRINE (PF) 2 %-1:200000 IJ SOLN
10.0000 mL | Freq: Once | INTRAMUSCULAR | Status: AC
  Administered 2015-04-27: 10 mL
  Filled 2015-04-27: qty 20

## 2015-04-27 MED ORDER — OXYCODONE-ACETAMINOPHEN 5-325 MG PO TABS: 1.0000 | ORAL_TABLET | Freq: Four times a day (QID) | ORAL | Status: DC | PRN

## 2015-04-27 MED ORDER — SULFAMETHOXAZOLE-TRIMETHOPRIM 800-160 MG PO TABS: 1.0000 | ORAL_TABLET | Freq: Two times a day (BID) | ORAL | Status: AC

## 2015-04-27 MED ORDER — MORPHINE SULFATE 4 MG/ML IJ SOLN: 6.0000 mg | Freq: Once | INTRAMUSCULAR | Status: DC

## 2015-04-27 MED ORDER — MORPHINE SULFATE 4 MG/ML IJ SOLN
6.0000 mg | Freq: Once | INTRAMUSCULAR | Status: AC
  Administered 2015-04-27: 6 mg via INTRAMUSCULAR
  Filled 2015-04-27: qty 2

## 2015-04-27 MED ORDER — ONDANSETRON 4 MG PO TBDP
4.0000 mg | ORAL_TABLET | Freq: Once | ORAL | Status: AC
  Administered 2015-04-27: 4 mg via ORAL
  Filled 2015-04-27: qty 1

## 2015-04-27 NOTE — ED Notes (Signed)
Pt. Srtated, I got this boil on my vaginal part, Im in a lot of pain since yesterday morning

## 2015-04-27 NOTE — ED Provider Notes (Signed)
CSN: [id number]     Arrival date & time 04/27/15  1329 History  This chart was scribed for Carlisle Cater, PA-C, working with Pattricia Boss, MD by Julien Nordmann, ED Scribe. This patient was seen in room TR11C/TR11C and the patient's care was started at 2:11 PM.    Chief Complaint  Patient presents with  . Abscess     The history is provided by the patient. No language interpreter was used.    HPI Comments: Tracey Manning is a 54 y.o. female who presents to the Emergency Department complaining of a constant, gradual worsening abscess on her genital area. Pt has been been in a lot of pain since yesterday morning. She notes she is unable to wear underwear among many other things. Pt notes she does not have any prior history of having abscesses in this area. No fever, N/V. No treatments PTA.   Past Medical History  Diagnosis Date  . Coronary artery disease   . Sciatica   . Hypertension   . Myocardial infarct   . Asthma   . Dyslipidemia   . Tobacco abuse   . Chest pain   . Collagen vascular disease    Past Surgical History  Procedure Laterality Date  . Cesarean section    . Dilation and curettage, diagnostic / therapeutic    . Nm myocar perf wall motion  08/2009    persantine myoview - normal perfusion in all regions, perfusion defect in anterior region (breast attenuation), EF 52%, low risk scan  . Cardiac catheterization  10/09/2003    [facility], [place]) - LAD with 30% prox narrowing, 50% stenosis in mid-portion of PLA; RCA with 40% narrowing proximally ([name], [name])  . Cardiac catheterization  10/10/2007    70% stenosis in first septal perforator branch of LAD, 60-70% narrowing in mid LAD, 20% narrowing in mid AV groove Cfx with 80% diffuse narrowing in small distal marginal, total occlusion of mid RCA with L to R collaterals, 90% stenosis diffusely in prox branch of RCA followed by 70% stenosis in secondary curve & 80% stenosis in small marginal branch ([name])  . Coronary angioplasty with stent placement  10/31/2007    PCI of distal Cfx with 2.25x9mm Taxus Adam DES, 60% narrowing of mid LAD ([name])  . Cardiac catheterization  05/28/2008    normal L main, RCA with 100% prox lesion w/distal filling from LAD collaterals, LAD with 20% prox tubular lesion/ 40% mid LAD lesion/previous stent patent ([name])  . Cardiac catheterization  09/15/2009    discrete 100% osital RCA lesion, 50% prox LAD lesion, non-obstructive disease in all coronaries ([name])  . Coronary angioplasty with stent placement  06/11/2011    PCI of prox-mid LAD with 3x93mm DES Resolute ([name])   Family History  Problem Relation Age of Onset  . Leukemia Mother   . Clotting disorder Father     blood clot  . Hypertension Sister   . Stroke Sister 44  . Bleeding Disorder Son     ITP "free bleeding disorder"   History  Substance Use Topics  . Smoking status: Current Every Day Smoker -- 0.50 packs/day    Types: Cigarettes  . Smokeless tobacco: Not on file  . Alcohol Use: No   OB History    No data available     Review of Systems  Constitutional: Negative for fever.  Gastrointestinal: Negative for nausea and vomiting.  Skin: Negative for  color change.       Pt has an abscess on top genital area  Hematological: Negative for adenopathy.      Allergies  Other; Dilaudid; and Latex  Home Medications   Prior to Admission medications   Medication Sig Start Date End Date Taking? Authorizing Provider  acyclovir (ZOVIRAX) 400 MG tablet Take 400 mg by mouth daily.    Historical Provider, MD  albuterol (PROVENTIL HFA;VENTOLIN HFA) 108 (90 BASE) MCG/ACT inhaler Inhale 2 puffs into the lungs every 6 (six) hours as needed for wheezing or shortness of breath.    Historical Provider, MD  amLODipine (NORVASC) 5 MG tablet Take 10 mg by mouth daily.    Historical Provider, MD  aspirin EC 325 MG tablet Take 325 mg by mouth daily.    Historical Provider, MD   atorvastatin (LIPITOR) 40 MG tablet Take 1 tablet (40 mg total) by mouth daily. 01/04/14   [name], MD  clopidogrel (PLAVIX) 75 MG tablet Take 1 tablet (75 mg total) by mouth daily with breakfast. 01/04/14   [name], MD  dicyclomine (BENTYL) 20 MG tablet Take 1 tablet (20 mg total) by mouth every 8 (eight) hours. 02/08/15   [name], MD  fluticasone (FLOVENT HFA) 220 MCG/ACT inhaler Inhale 1 puff into the lungs 2 (two) times daily as needed (wheezing).     Historical Provider, MD  fluticasone-salmeterol (ADVAIR HFA) 115-21 MCG/ACT inhaler Inhale 2 puffs into the lungs 2 (two) times daily. 01/04/14   [name], MD  lisinopril-hydrochlorothiazide (PRINZIDE,ZESTORETIC) 20-12.5 MG per tablet Take 2 tablets by mouth daily.    Historical Provider, MD  nitroGLYCERIN (NITROSTAT) 0.4 MG SL tablet Place 0.4 mg under the tongue every 5 (five) minutes as needed for chest pain.    Historical Provider, MD  Oxycodone HCl 10 MG TABS Take 1 tablet (10 mg total) by mouth every 6 (six) hours as needed. 04/04/15   [name], MD  oxyCODONE-acetaminophen (PERCOCET/ROXICET) 5-325 MG per tablet Take 1-2 tablets by mouth every 6 (six) hours as needed for severe pain. 03/28/15   Carlisle Cater, PA-C  zolpidem (AMBIEN) 10 MG tablet Take 10 mg by mouth at bedtime as needed for sleep.    Historical Provider, MD   Triage vitals: BP 149/106 mmHg  Pulse 79  Temp(Src) 97.5 F (36.4 C) (Oral)  Resp 20  Ht 5\' 1"  (1.549 m)  Wt 210 lb (95.255 kg)  BMI 39.70 kg/m2  SpO2 95%  LMP 04/07/2015 Physical Exam  Constitutional: She is oriented to person, place, and time. She appears well-developed and well-nourished. No distress.  HENT:  Head: Normocephalic.  Eyes: Conjunctivae are normal. Pupils are equal, round, and reactive to light. No scleral icterus.  Neck: Normal range of motion. Neck supple. No thyromegaly present.  Cardiovascular: Normal rate and regular rhythm.  Exam reveals no gallop and no  friction rub.   No murmur heard. Pulmonary/Chest: Effort normal and breath sounds normal. No respiratory distress. She has no wheezes. She has no rales.  Abdominal: Soft. Bowel sounds are normal. She exhibits no distension. There is no tenderness. There is no rebound.  Musculoskeletal: Normal range of motion.  Neurological: She is alert and oriented to person, place, and time.  Skin: Skin is warm and dry. No rash noted.  2 cm area of induration consistent with abscess right groin. No surrounding cellulitis. Very tender to palpation.  Psychiatric: She has a normal mood and affect. Her behavior is normal.  Nursing  note and vitals reviewed.   ED Course  Procedures  DIAGNOSTIC STUDIES: Oxygen Saturation is 95% on RA,adequate by my interpretation.  COORDINATION OF CARE:  2:14 PM Discussed treatment plan which includes I&D and pain medication with pt at bedside and pt agreed to plan.   Labs Review Labs Reviewed - No data to display  Imaging Review No results found.   EKG Interpretation None      INCISION AND DRAINAGE Performed by: Faustino Congress Consent: Verbal consent obtained. Risks and benefits: risks, benefits and alternatives were discussed Type: abscess  Body area: R groin  Anesthesia: local infiltration  Incision was made with a scalpel.  Local anesthetic: lidocaine 2% with epinephrine  Anesthetic total: 4 ml  Complexity: complex Blunt dissection to break up loculations  Drainage: purulent  Drainage amount: moderate  Packing material: none  Patient tolerance: Patient tolerated the procedure well with no immediate complications.  The patient was urged to return to the Emergency Department urgently with worsening pain, swelling, expanding erythema especially if it streaks away from the affected area, fever, or if they have any other concerns.   The patient was urged to return to the Emergency Department or go to their PCP in 48 hours for wound recheck if  the area is not significantly improved. The patient verbalized understanding and stated agreement with this plan.   Patient counseled on use of narcotic pain medications. Counseled not to combine these medications with others containing tylenol. Urged not to drink alcohol, drive, or perform any other activities that requires focus while taking these medications. The patient verbalizes understanding and agrees with the plan.  4:22 PM BP elevated prior to discharge. Pt asymptomatic. Pt just underwent painful procedure. Also, she has not taken her BP meds yet. She is going to go home and take her lisinopril. D/c to home.    MDM   Final diagnoses:  Abscess of groin, right   Patient with skin abscess amenable to incision and drainage. No signs of cellulitis is surrounding skin. Will d/c to home. Abx given due to location.   I personally performed the services described in this documentation, which was scribed in my presence. The recorded information has been reviewed and is accurate.     Carlisle Cater, PA-C 04/27/15 [address] PA-C 04/27/15 1623  Pattricia Boss, MD 04/27/15 [phone]

## 2015-04-27 NOTE — Discharge Instructions (Signed)
Please read and follow all provided instructions.  Your diagnoses today include:  1. Abscess of groin, right     Tests performed today include:  Vital signs. See below for your results today.   Medications prescribed:   Percocet (oxycodone/acetaminophen) - narcotic pain medication  DO NOT drive or perform any activities that require you to be awake and alert because this medicine can make you drowsy. BE VERY CAREFUL not to take multiple medicines containing Tylenol (also called acetaminophen). Doing so can lead to an overdose which can damage your liver and cause liver failure and possibly death.  Take any prescribed medications only as directed.   Home care instructions:   Follow any educational materials contained in this packet  Follow-up instructions: Return to the Emergency Department in 48 hours for a recheck if your symptoms are not significantly improved.  Please follow-up with your primary care provider in the next 1 week for further evaluation of your symptoms.   Return instructions:  Return to the Emergency Department if you have:  Fever  Worsening symptoms  Worsening pain  Worsening swelling  Redness of the skin that moves away from the affected area, especially if it streaks away from the affected area   Any other emergent concerns  Your vital signs today were: BP 149/106 mmHg   Pulse 79   Temp(Src) 97.5 F (36.4 C) (Oral)   Resp 20   Ht 5\' 1"  (1.549 m)   Wt 210 lb (95.255 kg)   BMI 39.70 kg/m2   SpO2 95%   LMP 04/07/2015 If your blood pressure (BP) was elevated above 135/85 this visit, please have this repeated by your doctor within one month. --------------

## 2015-04-27 NOTE — ED Notes (Signed)
Declined W/C at D/C and was escorted to lobby by RN. 

## 2015-05-15 ENCOUNTER — Telehealth: Payer: Self-pay | Admitting: Cardiovascular Disease

## 2015-05-21 ENCOUNTER — Ambulatory Visit (INDEPENDENT_AMBULATORY_CARE_PROVIDER_SITE_OTHER): Payer: Medicaid Other | Admitting: Podiatry

## 2015-05-21 ENCOUNTER — Encounter: Payer: Self-pay | Admitting: Podiatry

## 2015-05-21 VITALS — BP 156/105 | HR 74 | Resp 16

## 2015-05-21 DIAGNOSIS — L603 Nail dystrophy: Secondary | ICD-10-CM | POA: Diagnosis not present

## 2015-05-21 NOTE — Progress Notes (Signed)
   Subjective:    Patient ID: Tracey Manning, female    DOB: [birth year], 54 y.o.   MRN: [id number]  HPI Comments: "I have dark nails and something in the bottoms"  Patient c/o dark 1st nails bilateral for several years. There is a darkened line running from base of nail to tip. They are not sore, just concerned.  Also, she has 2 areas plantar bilateral that are callused and dark. Hurts when they get thick and when she wears flip flops.     Review of Systems  Musculoskeletal: Positive for back pain.  Skin:       Change in nails  Allergic/Immunologic: Positive for food allergies.  All other systems reviewed and are negative.      Objective:   Physical Exam: I have reviewed her past medical history medications allergies surgery social history and review of systems. Pulses are strongly palpable bilateral. Neurologic sensorium is intact Semmes-Weinstein monofilament. Deep tendon reflexes are intact bilaterally muscle strength is 5 over 5 dorsiflexion plantar flexors internal evertors. Orthopedic evaluation shows almost distal to the angle full range of motion without crepitation. Cutaneous evaluation demonstrates melanocytic onychia and 2 calluses bilaterally symmetrical medial and lateral midfoot associated with shoe gear wear.        Assessment & Plan:  Assessment: Callosities plantar aspect of the bilateral foot due to shoe gear. Next is normal melanocytic onychia.  Plan: Follow up with me as needed. We discussed the etiology pathology conservative versus surgical therapies.

## 2015-05-24 NOTE — Telephone Encounter (Signed)
Close encounter 

## 2015-05-27 ENCOUNTER — Emergency Department (HOSPITAL_COMMUNITY): Payer: Medicaid Other

## 2015-05-27 ENCOUNTER — Encounter (HOSPITAL_COMMUNITY): Payer: Self-pay | Admitting: Emergency Medicine

## 2015-05-27 ENCOUNTER — Emergency Department (HOSPITAL_COMMUNITY)
Admission: EM | Admit: 2015-05-27 | Discharge: 2015-05-27 | Disposition: A | Discharge status: Home / Self Care | Payer: Medicaid Other | Attending: Emergency Medicine | Admitting: Emergency Medicine

## 2015-05-27 DIAGNOSIS — I252 Old myocardial infarction: Secondary | ICD-10-CM | POA: Insufficient documentation

## 2015-05-27 DIAGNOSIS — I10 Essential (primary) hypertension: Secondary | ICD-10-CM | POA: Insufficient documentation

## 2015-05-27 DIAGNOSIS — Z91040 Latex allergy status: Secondary | ICD-10-CM | POA: Insufficient documentation

## 2015-05-27 DIAGNOSIS — Z9861 Coronary angioplasty status: Secondary | ICD-10-CM | POA: Insufficient documentation

## 2015-05-27 DIAGNOSIS — E785 Hyperlipidemia, unspecified: Secondary | ICD-10-CM | POA: Diagnosis not present

## 2015-05-27 DIAGNOSIS — Z9889 Other specified postprocedural states: Secondary | ICD-10-CM | POA: Insufficient documentation

## 2015-05-27 DIAGNOSIS — J45901 Unspecified asthma with (acute) exacerbation: Secondary | ICD-10-CM | POA: Diagnosis not present

## 2015-05-27 DIAGNOSIS — I2510 Atherosclerotic heart disease of native coronary artery without angina pectoris: Secondary | ICD-10-CM | POA: Insufficient documentation

## 2015-05-27 DIAGNOSIS — Z720 Tobacco use: Secondary | ICD-10-CM | POA: Diagnosis not present

## 2015-05-27 DIAGNOSIS — Z8739 Personal history of other diseases of the musculoskeletal system and connective tissue: Secondary | ICD-10-CM | POA: Insufficient documentation

## 2015-05-27 DIAGNOSIS — Z7982 Long term (current) use of aspirin: Secondary | ICD-10-CM | POA: Insufficient documentation

## 2015-05-27 DIAGNOSIS — Z7951 Long term (current) use of inhaled steroids: Secondary | ICD-10-CM | POA: Insufficient documentation

## 2015-05-27 DIAGNOSIS — Z79899 Other long term (current) drug therapy: Secondary | ICD-10-CM | POA: Diagnosis not present

## 2015-05-27 DIAGNOSIS — R0602 Shortness of breath: Secondary | ICD-10-CM | POA: Diagnosis present

## 2015-05-27 MED ORDER — HYDROCODONE-HOMATROPINE 5-1.5 MG/5ML PO SYRP: 5.0000 mL | ORAL_SOLUTION | Freq: Four times a day (QID) | ORAL | Status: DC | PRN

## 2015-05-27 MED ORDER — IPRATROPIUM BROMIDE 0.02 % IN SOLN
0.5000 mg | Freq: Once | RESPIRATORY_TRACT | Status: AC
  Administered 2015-05-27: 0.5 mg via RESPIRATORY_TRACT
  Filled 2015-05-27: qty 2.5

## 2015-05-27 MED ORDER — ALBUTEROL SULFATE HFA 108 (90 BASE) MCG/ACT IN AERS
2.0000 | INHALATION_SPRAY | Freq: Four times a day (QID) | RESPIRATORY_TRACT | Status: DC
  Administered 2015-05-27: 2 via RESPIRATORY_TRACT
  Filled 2015-05-27: qty 6.7

## 2015-05-27 MED ORDER — ALBUTEROL SULFATE (2.5 MG/3ML) 0.083% IN NEBU
5.0000 mg | INHALATION_SOLUTION | Freq: Once | RESPIRATORY_TRACT | Status: AC
  Administered 2015-05-27: 5 mg via RESPIRATORY_TRACT
  Filled 2015-05-27: qty 6

## 2015-05-27 MED ORDER — PREDNISONE 20 MG PO TABS: 40.0000 mg | ORAL_TABLET | Freq: Every day | ORAL | Status: DC

## 2015-05-27 MED ORDER — AZITHROMYCIN 250 MG PO TABS: 250.0000 mg | ORAL_TABLET | Freq: Every day | ORAL | Status: DC

## 2015-05-27 MED ORDER — PREDNISONE 20 MG PO TABS
60.0000 mg | ORAL_TABLET | Freq: Once | ORAL | Status: AC
  Administered 2015-05-27: 60 mg via ORAL
  Filled 2015-05-27: qty 3

## 2015-05-27 NOTE — ED Provider Notes (Signed)
CSN: [id number]     Arrival date & time 05/27/15  1354 History  This chart was scribed for non-physician practitioner, Montine Circle, PA-C, working with Orlie Dakin, MD by Ladene Artist, ED Scribe. This patient was seen in room TR01C/TR01C and the patient's care was started at 2:45 PM.   Chief Complaint  Patient presents with  . Asthma   The history is provided by the patient. No language interpreter was used.   HPI Comments: Tracey Manning is a 54 y.o. female, with a h/o asthma, who presents to the [facility] complaining of gradually worsening productive cough with sputum over the past week. Pt reports associated chest pain only with coughing, SOB and a globus sensation. She has tried 2 nebulizer treatments and using her rescue inhaler today without relief. Pt has tried Prednisone in the past. No h/o DM.  Past Medical History  Diagnosis Date  . Coronary artery disease   . Sciatica   . Hypertension   . Myocardial infarct   . Asthma   . Dyslipidemia   . Tobacco abuse   . Chest pain   . Collagen vascular disease    Past Surgical History  Procedure Laterality Date  . Cesarean section    . Dilation and curettage, diagnostic / therapeutic    . Nm myocar perf wall motion  08/2009    persantine myoview - normal perfusion in all regions, perfusion defect in anterior region (breast attenuation), EF 52%, low risk scan  . Cardiac catheterization  10/09/2003    [facility] [place], [place]) - LAD with 30% prox narrowing, 50% stenosis in mid-portion of PLA; RCA with 40% narrowing proximally ([name], [name])  . Cardiac catheterization  10/10/2007    70% stenosis in first septal perforator branch of LAD, 60-70% narrowing in mid LAD, 20% narrowing in mid AV groove Cfx with 80% diffuse narrowing in small distal marginal, total occlusion of mid RCA with L to R collaterals, 90% stenosis diffusely in prox branch of RCA followed by 70% stenosis in secondary curve & 80%  stenosis in small marginal branch ([name])  . Coronary angioplasty with stent placement  10/31/2007    PCI of distal Cfx with 2.25x42mm Taxus Adam DES, 60% narrowing of mid LAD ([name])  . Cardiac catheterization  05/28/2008    normal L main, RCA with 100% prox lesion w/distal filling from LAD collaterals, LAD with 20% prox tubular lesion/ 40% mid LAD lesion/previous stent patent ([name])  . Cardiac catheterization  09/15/2009    discrete 100% osital RCA lesion, 50% prox LAD lesion, non-obstructive disease in all coronaries ([name])  . Coronary angioplasty with stent placement  06/11/2011    PCI of prox-mid LAD with 3x2mm DES Resolute ([name])   Family History  Problem Relation Age of Onset  . Leukemia Mother   . Clotting disorder Father     blood clot  . Hypertension Sister   . Stroke Sister 58  . Bleeding Disorder Son     ITP "free bleeding disorder"   History  Substance Use Topics  . Smoking status: Current Every Day Smoker -- 0.50 packs/day    Types: Cigarettes  . Smokeless tobacco: Not on file  . Alcohol Use: 0.0 oz/week    0 Standard drinks or equivalent per week   OB History    No data available     Review of Systems  Constitutional: Negative for fever.  Respiratory: Positive  for cough and shortness of breath.   Cardiovascular: Positive for chest pain (only with coughing).   Allergies  Other; Dilaudid; and Latex  Home Medications   Prior to Admission medications   Medication Sig Start Date End Date Taking? Authorizing Provider  acyclovir (ZOVIRAX) 400 MG tablet Take 400 mg by mouth daily.    Historical Provider, MD  albuterol (PROVENTIL HFA;VENTOLIN HFA) 108 (90 BASE) MCG/ACT inhaler Inhale 2 puffs into the lungs every 6 (six) hours as needed for wheezing or shortness of breath.    Historical Provider, MD  amLODipine (NORVASC) 5 MG tablet Take 10 mg by mouth daily.    Historical Provider, MD  aspirin EC 325 MG tablet Take 325 mg by  mouth daily.    Historical Provider, MD  atorvastatin (LIPITOR) 40 MG tablet Take 1 tablet (40 mg total) by mouth daily. 01/04/14   [name], MD  clopidogrel (PLAVIX) 75 MG tablet Take 1 tablet (75 mg total) by mouth daily with breakfast. 01/04/14   [name], MD  dicyclomine (BENTYL) 20 MG tablet Take 1 tablet (20 mg total) by mouth every 8 (eight) hours. 02/08/15   [name], MD  fluticasone (FLOVENT HFA) 220 MCG/ACT inhaler Inhale 1 puff into the lungs 2 (two) times daily as needed (wheezing).     Historical Provider, MD  fluticasone-salmeterol (ADVAIR HFA) 115-21 MCG/ACT inhaler Inhale 2 puffs into the lungs 2 (two) times daily. 01/04/14   [name], MD  lisinopril-hydrochlorothiazide (PRINZIDE,ZESTORETIC) 20-12.5 MG per tablet Take 2 tablets by mouth daily.    Historical Provider, MD  nitroGLYCERIN (NITROSTAT) 0.4 MG SL tablet Place 0.4 mg under the tongue every 5 (five) minutes as needed for chest pain.    Historical Provider, MD  Oxycodone HCl 10 MG TABS Take 1 tablet (10 mg total) by mouth every 6 (six) hours as needed. 04/04/15   [name], MD  oxyCODONE-acetaminophen (PERCOCET/ROXICET) 5-325 MG per tablet Take 1 tablet by mouth every 6 (six) hours as needed for severe pain. 04/27/15   Carlisle Cater, PA-C  zolpidem (AMBIEN) 10 MG tablet Take 10 mg by mouth at bedtime as needed for sleep.    Historical Provider, MD   BP 134/88 mmHg  Pulse 104  Temp(Src) 98.2 F (36.8 C) (Oral)  Resp 22  Ht 5\' 1"  (1.549 m)  Wt 210 lb (95.255 kg)  BMI 39.70 kg/m2  SpO2 97%  LMP 05/15/2015 Physical Exam  Constitutional: She is oriented to person, place, and time. She appears well-developed and well-nourished. No distress.  HENT:  Head: Normocephalic and atraumatic.  Eyes: Conjunctivae and EOM are normal.  Neck: Neck supple. No tracheal deviation present.  Cardiovascular: Normal rate.   Pulmonary/Chest: Effort normal. No respiratory distress. She has wheezes.  Scattered  wheezes. No increased work of breathing. Coughing on exam.   Musculoskeletal: Normal range of motion.  Neurological: She is alert and oriented to person, place, and time.  Skin: Skin is warm and dry.  Psychiatric: She has a normal mood and affect. Her behavior is normal.  Nursing note and vitals reviewed.  ED Course  Procedures (including critical care time) DIAGNOSTIC STUDIES: Oxygen Saturation is 97% on RA, normal by my interpretation.    COORDINATION OF CARE: 2:48 PM-Discussed treatment plan which includes Prednisone, nebulizer and CXR with pt at bedside and pt agreed to plan.   Labs Review Labs Reviewed - No data to display  Imaging Review Dg Chest 2 View  05/27/2015  CLINICAL DATA:  Three day history of nonproductive cough and shortness of breath. Mid chest pain while coughing. Current smoker with current history of asthma, hypertension and prior MI with coronary stent placement.  EXAM: CHEST  2 VIEW  COMPARISON:  02/04/2015 and earlier.  FINDINGS: Suboptimal inspiration due to body habitus accounts for crowded bronchovascular markings, especially in the bases, and accentuates the cardiac silhouette. Taking this into account, cardiac silhouette mildly enlarged but stable. Lungs clear. Bronchovascular markings normal. Pulmonary vascularity normal. No visible pleural effusions. No pneumothorax. Degenerative changes throughout the thoracic spine.  IMPRESSION: Suboptimal inspiration. Stable mild cardiomegaly. No acute cardiopulmonary disease.   Electronically Signed   By: Evangeline Dakin M.D.   On: 05/27/2015 15:17    EKG Interpretation None      MDM   Final diagnoses:  Asthma exacerbation    Patient ambulated in ED with O2 saturations maintained >90, no current signs of respiratory distress. Lung exam improved after nebulizer treatment. Prednisone given in the ED and pt will bd dc with 5 day burst. Pt states they are breathing at baseline. Pt has been instructed to continue using  prescribed medications and to speak with PCP about today's exacerbation.   Filed Vitals:   05/27/15 1608  BP: 127/79  Pulse: 79  Temp: 98.2 F (36.8 C)  Resp: 20     I personally performed the services described in this documentation, which was scribed in my presence. The recorded information has been reviewed and is accurate.     Montine Circle, PA-C 05/27/15 1622  Orlie Dakin, MD 05/27/15 [id number]

## 2015-05-27 NOTE — ED Notes (Signed)
Okayed with Janit Bern, NP to move patient to FT.

## 2015-05-27 NOTE — ED Notes (Signed)
Patient states has been having trouble with asthma for the last week.  Patient states started having additional problems today, took two breathing treatments this morning and rescue inhaler.   Patient states chest hurts only when she coughs.   Patient can speak in full sentences.

## 2015-05-27 NOTE — ED Notes (Signed)
Pt verbalizes understanding of d/c instructions and denies any further needs at this time. 

## 2015-05-27 NOTE — Discharge Instructions (Signed)

## 2015-06-05 ENCOUNTER — Ambulatory Visit (INDEPENDENT_AMBULATORY_CARE_PROVIDER_SITE_OTHER): Payer: Medicaid Other | Admitting: Cardiovascular Disease

## 2015-06-05 ENCOUNTER — Encounter: Payer: Self-pay | Admitting: Cardiovascular Disease

## 2015-06-05 VITALS — BP 142/90 | HR 77 | Ht 61.0 in | Wt 194.4 lb

## 2015-06-05 DIAGNOSIS — I10 Essential (primary) hypertension: Secondary | ICD-10-CM

## 2015-06-05 DIAGNOSIS — I2583 Coronary atherosclerosis due to lipid rich plaque: Secondary | ICD-10-CM

## 2015-06-05 DIAGNOSIS — E785 Hyperlipidemia, unspecified: Secondary | ICD-10-CM

## 2015-06-05 DIAGNOSIS — I2510 Atherosclerotic heart disease of native coronary artery without angina pectoris: Secondary | ICD-10-CM

## 2015-06-05 DIAGNOSIS — R0602 Shortness of breath: Secondary | ICD-10-CM

## 2015-06-05 MED ORDER — NITROGLYCERIN 0.4 MG SL SUBL: 0.4000 mg | SUBLINGUAL_TABLET | SUBLINGUAL | Status: DC | PRN

## 2015-06-05 NOTE — Patient Instructions (Signed)
Dr Berry recommends that you schedule a follow-up appointment in 1 year. You will receive a reminder letter in the mail two months in advance. If you don't receive a letter, please call our office to schedule the follow-up appointment. 

## 2015-06-05 NOTE — Assessment & Plan Note (Signed)
History of coronary artery disease dating back to 2004. She has been placed by Dr. Royal Piedra December 2008 (Taxus 2.5 mm x 24,000,000). She had a Medtronic stent placed in her proximal LAD by Dr. Claiborne Billings 06/11/11. A Myoview stress test performed last year and 2-D echo were normal. She denies chest pain or shortness of breath.

## 2015-06-05 NOTE — Assessment & Plan Note (Signed)
History of hypertension blood pressure measured at 142/90. She did say she that she missed her lisinopril this morning. She is on amlodipine, lisinopril and hydrochlorothiazide. Continue current meds at current dosing

## 2015-06-05 NOTE — Progress Notes (Signed)
06/05/2015 Tracey Manning   07/27/1961  [id number]  Primary Physician [name], MD Primary Cardiologist: [name] MD Renae Gloss   HPI:  Ms. Hayden is a 54 year old moderately overweight single African American female mother of 2 children, grandmother and 2 grandchildren who currently is out of work on [employer] and disability. I last saw her in the office one year ago.She was referred by Dr. Shawna Orleans for cardiovascular evaluation because of chest pain. Her cardiac risk factor profile is positive for 20-pack-years of tobacco abuse currently smoking one pack per day, treated type of pressure and high cholesterol. She has had multiple heart attacks in the past dating back to 2004. She had a stent placed in her circumflex by Dr. Einar Gip December 2008 (Taxus 2.5 x 24 mm). She had a Medtronic stent placed in her proximal LAD by Dr. Claiborne Billings 06/11/11. Over the last 6 months she has developed recurrent chest pain as well as dyspnea on exertion. Since I saw her on 04/04/14 I obtained a Myoview stress test which was normal and a 2-D echo which revealed normal LV function. Since I saw her back year ago she's done remarkably well. She is trying to lose weight. Her blood pressure is under better control.  Current Outpatient Prescriptions  Medication Sig Dispense Refill  . acyclovir (ZOVIRAX) 400 MG tablet Take 400 mg by mouth daily.    Marland Kitchen albuterol (PROVENTIL HFA;VENTOLIN HFA) 108 (90 BASE) MCG/ACT inhaler Inhale 2 puffs into the lungs every 6 (six) hours as needed for wheezing or shortness of breath.    Marland Kitchen amLODipine (NORVASC) 5 MG tablet Take 10 mg by mouth daily.    Marland Kitchen aspirin EC 325 MG tablet Take 325 mg by mouth daily.    Marland Kitchen atorvastatin (LIPITOR) 40 MG tablet Take 1 tablet (40 mg total) by mouth daily. 30 tablet 2  . azithromycin (ZITHROMAX Z-PAK) 250 MG tablet Take 1 tablet (250 mg total) by mouth daily. 500mg  PO day 1, then 250mg  PO days 205 6 tablet 0  . clopidogrel  (PLAVIX) 75 MG tablet Take 1 tablet (75 mg total) by mouth daily with breakfast. 30 tablet 2  . dicyclomine (BENTYL) 20 MG tablet Take 1 tablet (20 mg total) by mouth every 8 (eight) hours. 42 tablet 1  . fluticasone (FLOVENT HFA) 220 MCG/ACT inhaler Inhale 1 puff into the lungs 2 (two) times daily as needed (wheezing).     . fluticasone-salmeterol (ADVAIR HFA) 115-21 MCG/ACT inhaler Inhale 2 puffs into the lungs 2 (two) times daily. 1 Inhaler 12  . HYDROcodone-homatropine (HYCODAN) 5-1.5 MG/5ML syrup Take 5 mLs by mouth every 6 (six) hours as needed for cough. 120 mL 0  . lisinopril-hydrochlorothiazide (PRINZIDE,ZESTORETIC) 20-12.5 MG per tablet Take 2 tablets by mouth daily.    . nitroGLYCERIN (NITROSTAT) 0.4 MG SL tablet Place 1 tablet (0.4 mg total) under the tongue every 5 (five) minutes as needed for chest pain. 25 tablet 6  . Oxycodone HCl 10 MG TABS Take 1 tablet (10 mg total) by mouth every 6 (six) hours as needed. 40 tablet 0  . oxyCODONE-acetaminophen (PERCOCET/ROXICET) 5-325 MG per tablet Take 1 tablet by mouth every 6 (six) hours as needed for severe pain. 6 tablet 0  . predniSONE (DELTASONE) 20 MG tablet Take 2 tablets (40 mg total) by mouth daily. 10 tablet 0  . zolpidem (AMBIEN) 10 MG tablet Take 10 mg by mouth at bedtime as needed for sleep.     No current facility-administered medications  for this visit.    Allergies  Allergen Reactions  . Other Anaphylaxis and Hives    Fresh strawberries (throat swelled)  . Dilaudid [Hydromorphone Hcl] Nausea And Vomiting  . Latex Swelling    No reaction with bandaids    History   Social History  . Marital Status: Single    Spouse Name: N/A  . Number of Children: 2  . Years of Education: 12   Occupational History  . Not on file.   Social History Main Topics  . Smoking status: Current Every Day Smoker -- 0.50 packs/day    Types: Cigarettes  . Smokeless tobacco: Not on file  . Alcohol Use: 0.0 oz/week    0 Standard drinks or  equivalent per week  . Drug Use: Yes    Special: Marijuana  . Sexual Activity: Not Currently   Other Topics Concern  . Not on file   Social History Narrative     Review of Systems: General: negative for chills, fever, night sweats or weight changes.  Cardiovascular: negative for chest pain, dyspnea on exertion, edema, orthopnea, palpitations, paroxysmal nocturnal dyspnea or shortness of breath Dermatological: negative for rash Respiratory: negative for cough or wheezing Urologic: negative for hematuria Abdominal: negative for nausea, vomiting, diarrhea, bright red blood per rectum, melena, or hematemesis Neurologic: negative for visual changes, syncope, or dizziness All other systems reviewed and are otherwise negative except as noted above.    Blood pressure 142/90, pulse 77, height 5\' 1"  (1.549 m), weight 194 lb 6.4 oz (88.179 kg), last menstrual period 05/14/2015.  General appearance: alert and no distress Neck: no adenopathy, no carotid bruit, no JVD, supple, symmetrical, trachea midline and thyroid not enlarged, symmetric, no tenderness/mass/nodules Lungs: clear to auscultation bilaterally Heart: regular rate and rhythm, S1, S2 normal, no murmur, click, rub or gallop Extremities: extremities normal, atraumatic, no cyanosis or edema  EKG normal sinus rhythm at 77 without ST or T-wave changes. I personally reviewed this EKG  ASSESSMENT AND PLAN:   Hyperlipidemia History of hyperlipidemia on Lipitor for 40 mg a day followed by her PCP  Essential hypertension History of hypertension blood pressure measured at 142/90. She did say she that she missed her lisinopril this morning. She is on amlodipine, lisinopril and hydrochlorothiazide. Continue current meds at current dosing  Coronary artery disease History of coronary artery disease dating back to 2004. She has been placed by Dr. Royal Piedra December 2008 (Taxus 2.5 mm x 24,000,000). She had a Medtronic stent placed in her proximal  LAD by Dr. Claiborne Billings 06/11/11. A Myoview stress test performed last year and 2-D echo were normal. She denies chest pain or shortness of breath.      [name] MD FACP,FACC,FAHA, [facility] 06/05/2015 11:26 AM

## 2015-06-05 NOTE — Assessment & Plan Note (Signed)
History of hyperlipidemia on Lipitor for 40 mg a day followed by her PCP

## 2015-06-20 ENCOUNTER — Ambulatory Visit (INDEPENDENT_AMBULATORY_CARE_PROVIDER_SITE_OTHER): Payer: Medicaid Other | Admitting: Obstetrics

## 2015-06-20 DIAGNOSIS — Z0000 Encounter for general adult medical examination without abnormal findings: Secondary | ICD-10-CM

## 2015-06-21 NOTE — Progress Notes (Signed)
Patient cancelled appointment.

## 2015-08-05 ENCOUNTER — Ambulatory Visit (INDEPENDENT_AMBULATORY_CARE_PROVIDER_SITE_OTHER): Payer: Medicaid Other | Admitting: Obstetrics

## 2015-08-05 ENCOUNTER — Encounter: Payer: Self-pay | Admitting: Obstetrics

## 2015-08-05 VITALS — BP 143/95 | HR 67 | Temp 97.7°F | Wt 206.6 lb

## 2015-08-05 DIAGNOSIS — Z01419 Encounter for gynecological examination (general) (routine) without abnormal findings: Secondary | ICD-10-CM

## 2015-08-05 DIAGNOSIS — IMO0002 Reserved for concepts with insufficient information to code with codable children: Secondary | ICD-10-CM

## 2015-08-05 DIAGNOSIS — Z0000 Encounter for general adult medical examination without abnormal findings: Secondary | ICD-10-CM

## 2015-08-05 DIAGNOSIS — R102 Pelvic and perineal pain: Secondary | ICD-10-CM

## 2015-08-05 MED ORDER — OXYCODONE HCL 10 MG PO TABS: 10.0000 mg | ORAL_TABLET | Freq: Four times a day (QID) | ORAL | Status: DC | PRN

## 2015-08-06 ENCOUNTER — Encounter: Payer: Self-pay | Admitting: Obstetrics

## 2015-08-06 NOTE — Progress Notes (Signed)
Subjective:        Tracey Manning is a 54 y.o. female here for a routine exam.  Current complaints: Painful intercourse.    Personal health questionnaire:  Is patient [name], have a family history of breast and/or ovarian cancer: no Is there a family history of uterine cancer diagnosed at age < 82, gastrointestinal cancer, urinary tract cancer, family member who is a Field seismologist syndrome-associated carrier: no Is the patient overweight and hypertensive, family history of diabetes, personal history of gestational diabetes, preeclampsia or PCOS: no Is patient over 46, have PCOS,  family history of premature CHD under age 40, diabetes, smoke, have hypertension or peripheral artery disease:  no At any time, has a partner hit, kicked or otherwise hurt or frightened you?: no Over the past 2 weeks, have you felt down, depressed or hopeless?: no Over the past 2 weeks, have you felt little interest or pleasure in doing things?:no   Gynecologic History No LMP recorded. Contraception: none Last Pap: 2015. Results were: normal Last mammogram: 2016. Results were: normal  Obstetric History OB History  No data available    Past Medical History  Diagnosis Date  . Coronary artery disease   . Sciatica   . Hypertension   . Myocardial infarct   . Asthma   . Dyslipidemia   . Tobacco abuse   . Chest pain   . Collagen vascular disease     Past Surgical History  Procedure Laterality Date  . Cesarean section    . Dilation and curettage, diagnostic / therapeutic    . Nm myocar perf wall motion  08/2009    persantine myoview - normal perfusion in all regions, perfusion defect in anterior region (breast attenuation), EF 52%, low risk scan  . Cardiac catheterization  10/09/2003    [place], [place]) - LAD with 30% prox narrowing, 50% stenosis in mid-portion of PLA; RCA with 40% narrowing proximally ([name], [name])  . Cardiac catheterization  10/10/2007    70%  stenosis in first septal perforator branch of LAD, 60-70% narrowing in mid LAD, 20% narrowing in mid AV groove Cfx with 80% diffuse narrowing in small distal marginal, total occlusion of mid RCA with L to R collaterals, 90% stenosis diffusely in prox branch of RCA followed by 70% stenosis in secondary curve & 80% stenosis in small marginal branch ([name])  . Coronary angioplasty with stent placement  10/31/2007    PCI of distal Cfx with 2.25x43mm Taxus Adam DES, 60% narrowing of mid LAD ([name])  . Cardiac catheterization  05/28/2008    normal L main, RCA with 100% prox lesion w/distal filling from LAD collaterals, LAD with 20% prox tubular lesion/ 40% mid LAD lesion/previous stent patent ([name])  . Cardiac catheterization  09/15/2009    discrete 100% osital RCA lesion, 50% prox LAD lesion, non-obstructive disease in all coronaries ([name])  . Coronary angioplasty with stent placement  06/11/2011    PCI of prox-mid LAD with 3x46mm DES Resolute ([name])     Current outpatient prescriptions:  .  acyclovir (ZOVIRAX) 400 MG tablet, Take 400 mg by mouth daily., Disp: , Rfl:  .  amLODipine (NORVASC) 5 MG tablet, Take 10 mg by mouth daily., Disp: , Rfl:  .  aspirin EC 325 MG tablet, Take 325 mg by mouth daily., Disp: , Rfl:  .  atorvastatin (LIPITOR) 40 MG tablet, Take 1 tablet (40 mg total) by mouth daily.,  Disp: 30 tablet, Rfl: 2 .  clopidogrel (PLAVIX) 75 MG tablet, Take 1 tablet (75 mg total) by mouth daily with breakfast., Disp: 30 tablet, Rfl: 2 .  dicyclomine (BENTYL) 20 MG tablet, Take 1 tablet (20 mg total) by mouth every 8 (eight) hours., Disp: 42 tablet, Rfl: 1 .  fluticasone (FLOVENT HFA) 220 MCG/ACT inhaler, Inhale 1 puff into the lungs 2 (two) times daily as needed (wheezing). , Disp: , Rfl:  .  fluticasone-salmeterol (ADVAIR HFA) 115-21 MCG/ACT inhaler, Inhale 2 puffs into the lungs 2 (two) times daily., Disp: 1 Inhaler, Rfl: 12 .   lisinopril-hydrochlorothiazide (PRINZIDE,ZESTORETIC) 20-12.5 MG per tablet, Take 2 tablets by mouth daily., Disp: , Rfl:  .  nitroGLYCERIN (NITROSTAT) 0.4 MG SL tablet, Place 1 tablet (0.4 mg total) under the tongue every 5 (five) minutes as needed for chest pain., Disp: 25 tablet, Rfl: 6 .  oxyCODONE-acetaminophen (PERCOCET/ROXICET) 5-325 MG per tablet, Take 1 tablet by mouth every 6 (six) hours as needed for severe pain., Disp: 6 tablet, Rfl: 0 .  zolpidem (AMBIEN) 10 MG tablet, Take 10 mg by mouth at bedtime as needed for sleep., Disp: , Rfl:  .  albuterol (PROVENTIL HFA;VENTOLIN HFA) 108 (90 BASE) MCG/ACT inhaler, Inhale 2 puffs into the lungs every 6 (six) hours as needed for wheezing or shortness of breath., Disp: , Rfl:  .  azithromycin (ZITHROMAX Z-PAK) 250 MG tablet, Take 1 tablet (250 mg total) by mouth daily. 500mg  PO day 1, then 250mg  PO days 205, Disp: 6 tablet, Rfl: 0 .  HYDROcodone-homatropine (HYCODAN) 5-1.5 MG/5ML syrup, Take 5 mLs by mouth every 6 (six) hours as needed for cough., Disp: 120 mL, Rfl: 0 .  Oxycodone HCl 10 MG TABS, Take 1 tablet (10 mg total) by mouth every 6 (six) hours as needed., Disp: 40 tablet, Rfl: 0 .  predniSONE (DELTASONE) 20 MG tablet, Take 2 tablets (40 mg total) by mouth daily., Disp: 10 tablet, Rfl: 0 Allergies  Allergen Reactions  . Other Anaphylaxis and Hives    Fresh strawberries (throat swelled)  . Dilaudid [Hydromorphone Hcl] Nausea And Vomiting  . Latex Swelling    No reaction with bandaids    Social History  Substance Use Topics  . Smoking status: Current Every Day Smoker -- 0.50 packs/day    Types: Cigarettes  . Smokeless tobacco: Not on file  . Alcohol Use: 0.0 oz/week    0 Standard drinks or equivalent per week    Family History  Problem Relation Age of Onset  . Leukemia Mother   . Clotting disorder Father     blood clot  . Hypertension Sister   . Stroke Sister 32  . Bleeding Disorder Son     ITP "free bleeding disorder"       Review of Systems  Constitutional: negative for fatigue and weight loss Respiratory: negative for cough and wheezing Cardiovascular: negative for chest pain, fatigue and palpitations Gastrointestinal: negative for abdominal pain and change in bowel habits Musculoskeletal:negative for myalgias Neurological: negative for gait problems and tremors Behavioral/Psych: negative for abusive relationship, depression Endocrine: negative for temperature intolerance   Genitourinary:negative for abnormal menstrual periods, genital lesions, hot flashes, sexual problems and vaginal discharge Integument/breast: negative for breast lump, breast tenderness, nipple discharge and skin lesion(s)    Objective:       BP 143/95 mmHg  Pulse 67  Temp(Src) 97.7 F (36.5 C)  Wt 206 lb 9.6 oz (93.713 kg) General:   alert  Skin:   no rash  or abnormalities  Lungs:   clear to auscultation bilaterally  Heart:   regular rate and rhythm, S1, S2 normal, no murmur, click, rub or gallop  Breasts:   normal without suspicious masses, skin or nipple changes or axillary nodes  Abdomen:  normal findings: no organomegaly, soft, non-tender and no hernia  Pelvis:  External genitalia: normal general appearance Urinary system: urethral meatus normal and bladder without fullness, nontender Vaginal: normal without tenderness, induration or masses Cervix: normal appearance Adnexa: normal bimanual exam Uterus: anteverted and non-tender, normal size   Lab Review Urine pregnancy test Labs reviewed yes Radiologic studies reviewed yes    Assessment:    Healthy female exam.    Dyspareunia / Pelvic pain   Plan:   Oxycodone Rx   Education reviewed: calcium supplements, low fat, low cholesterol diet, safe sex/STD prevention, self breast exams and weight bearing exercise. Follow up in: 1 year.   Meds ordered this encounter  Medications  . Oxycodone HCl 10 MG TABS    Sig: Take 1 tablet (10 mg total) by mouth every  6 (six) hours as needed.    Dispense:  40 tablet    Refill:  0   Orders Placed This Encounter  Procedures  . SureSwab, Vaginosis/Vaginitis Plus

## 2015-08-07 LAB — PAP, TP IMAGING W/ HPV RNA, RFLX HPV TYPE 16,18/45: HPV mRNA, High Risk: NOT DETECTED

## 2015-08-09 ENCOUNTER — Other Ambulatory Visit: Payer: Self-pay | Admitting: Obstetrics

## 2015-08-09 DIAGNOSIS — N760 Acute vaginitis: Principal | ICD-10-CM

## 2015-08-09 DIAGNOSIS — B9689 Other specified bacterial agents as the cause of diseases classified elsewhere: Secondary | ICD-10-CM

## 2015-08-09 LAB — SURESWAB, VAGINOSIS/VAGINITIS PLUS
Atopobium vaginae: 7.6 Log (cells/mL)
C. albicans, DNA: NOT DETECTED
C. glabrata, DNA: NOT DETECTED
C. parapsilosis, DNA: NOT DETECTED
C. trachomatis RNA, TMA: NOT DETECTED
C. tropicalis, DNA: NOT DETECTED
Gardnerella vaginalis: >8 Log (cells/mL)
LACTOBACILLUS SPECIES: NOT DETECTED Log (cells/mL)
MEGASPHAERA SPECIES: >8 Log (cells/mL)
N. gonorrhoeae RNA, TMA: NOT DETECTED
T. vaginalis RNA, QL TMA: NOT DETECTED

## 2015-08-09 MED ORDER — METRONIDAZOLE 500 MG PO TABS: 500.0000 mg | ORAL_TABLET | Freq: Two times a day (BID) | ORAL | Status: DC

## 2015-08-12 ENCOUNTER — Telehealth: Payer: Self-pay | Admitting: *Deleted

## 2015-08-12 NOTE — Telephone Encounter (Signed)
Error

## 2015-11-13 ENCOUNTER — Observation Stay (HOSPITAL_COMMUNITY)
Admission: AD | Admit: 2015-11-13 | Discharge: 2015-11-14 | Disposition: A | Discharge status: Home / Self Care | Payer: Medicaid Other | Source: Ambulatory Visit | Attending: Internal Medicine | Admitting: Internal Medicine

## 2015-11-13 DIAGNOSIS — N939 Abnormal uterine and vaginal bleeding, unspecified: Secondary | ICD-10-CM | POA: Diagnosis present

## 2015-11-13 DIAGNOSIS — N760 Acute vaginitis: Secondary | ICD-10-CM | POA: Insufficient documentation

## 2015-11-13 DIAGNOSIS — Z91040 Latex allergy status: Secondary | ICD-10-CM | POA: Insufficient documentation

## 2015-11-13 DIAGNOSIS — Z7902 Long term (current) use of antithrombotics/antiplatelets: Secondary | ICD-10-CM | POA: Insufficient documentation

## 2015-11-13 DIAGNOSIS — I2510 Atherosclerotic heart disease of native coronary artery without angina pectoris: Secondary | ICD-10-CM | POA: Insufficient documentation

## 2015-11-13 DIAGNOSIS — J45909 Unspecified asthma, uncomplicated: Secondary | ICD-10-CM | POA: Insufficient documentation

## 2015-11-13 DIAGNOSIS — R079 Chest pain, unspecified: Secondary | ICD-10-CM | POA: Diagnosis not present

## 2015-11-13 DIAGNOSIS — R102 Pelvic and perineal pain: Secondary | ICD-10-CM | POA: Diagnosis not present

## 2015-11-13 DIAGNOSIS — M5136 Other intervertebral disc degeneration, lumbar region: Secondary | ICD-10-CM | POA: Diagnosis not present

## 2015-11-13 DIAGNOSIS — Z79899 Other long term (current) drug therapy: Secondary | ICD-10-CM | POA: Diagnosis not present

## 2015-11-13 DIAGNOSIS — R001 Bradycardia, unspecified: Secondary | ICD-10-CM | POA: Diagnosis not present

## 2015-11-13 DIAGNOSIS — K5730 Diverticulosis of large intestine without perforation or abscess without bleeding: Secondary | ICD-10-CM | POA: Insufficient documentation

## 2015-11-13 DIAGNOSIS — R109 Unspecified abdominal pain: Secondary | ICD-10-CM | POA: Diagnosis present

## 2015-11-13 DIAGNOSIS — E785 Hyperlipidemia, unspecified: Secondary | ICD-10-CM | POA: Insufficient documentation

## 2015-11-13 DIAGNOSIS — B9689 Other specified bacterial agents as the cause of diseases classified elsewhere: Secondary | ICD-10-CM

## 2015-11-13 DIAGNOSIS — I10 Essential (primary) hypertension: Secondary | ICD-10-CM | POA: Insufficient documentation

## 2015-11-13 DIAGNOSIS — R1031 Right lower quadrant pain: Secondary | ICD-10-CM | POA: Insufficient documentation

## 2015-11-13 DIAGNOSIS — Z885 Allergy status to narcotic agent status: Secondary | ICD-10-CM | POA: Insufficient documentation

## 2015-11-13 DIAGNOSIS — K760 Fatty (change of) liver, not elsewhere classified: Secondary | ICD-10-CM | POA: Diagnosis not present

## 2015-11-13 DIAGNOSIS — N938 Other specified abnormal uterine and vaginal bleeding: Secondary | ICD-10-CM | POA: Diagnosis not present

## 2015-11-13 DIAGNOSIS — R51 Headache: Secondary | ICD-10-CM | POA: Insufficient documentation

## 2015-11-13 DIAGNOSIS — D259 Leiomyoma of uterus, unspecified: Secondary | ICD-10-CM | POA: Diagnosis not present

## 2015-11-13 DIAGNOSIS — F17210 Nicotine dependence, cigarettes, uncomplicated: Secondary | ICD-10-CM | POA: Insufficient documentation

## 2015-11-13 DIAGNOSIS — Z7982 Long term (current) use of aspirin: Secondary | ICD-10-CM | POA: Diagnosis not present

## 2015-11-13 DIAGNOSIS — Z888 Allergy status to other drugs, medicaments and biological substances status: Secondary | ICD-10-CM | POA: Insufficient documentation

## 2015-11-13 DIAGNOSIS — M25511 Pain in right shoulder: Secondary | ICD-10-CM | POA: Diagnosis not present

## 2015-11-13 DIAGNOSIS — Z9861 Coronary angioplasty status: Secondary | ICD-10-CM | POA: Diagnosis present

## 2015-11-13 DIAGNOSIS — I252 Old myocardial infarction: Secondary | ICD-10-CM | POA: Diagnosis not present

## 2015-11-13 DIAGNOSIS — R0789 Other chest pain: Secondary | ICD-10-CM | POA: Diagnosis present

## 2015-11-13 HISTORY — DX: Gastro-esophageal reflux disease without esophagitis: K21.9

## 2015-11-13 HISTORY — DX: Anxiety disorder, unspecified: F41.9

## 2015-11-13 HISTORY — DX: Depression, unspecified: F32.A

## 2015-11-13 HISTORY — DX: Major depressive disorder, single episode, unspecified: F32.9

## 2015-11-13 NOTE — MAU Note (Signed)
Provider at bedside

## 2015-11-13 NOTE — MAU Note (Signed)
Pt reports no period since May 2016, started bleeding on 12/09 and has continued to bleed, also reports lower abd pain since 12/09 and is getting worse. Also started having chest pain off/on x 2 days, has NTG and used it at 2300 and now has a headache.

## 2015-11-13 NOTE — MAU Provider Note (Signed)
History     CSN: [id number]  Arrival date and time: 11/13/15 2307   First Provider Initiated Contact with Patient 11/13/15 2328      Chief Complaint  Patient presents with  . Vaginal Bleeding  . Chest Pain   Vaginal Bleeding The patient's primary symptoms include vaginal discharge. This is a new problem. The current episode started 1 to 4 weeks ago. The problem occurs constantly. The problem has been gradually worsening. The pain is moderate. The problem affects both sides. She is not pregnant. Associated symptoms include abdominal pain and headaches. The vaginal discharge was bloody. The vaginal bleeding is heavier than menses. She has not been passing clots. She has not been passing tissue. Nothing aggravates the symptoms. She has tried nothing for the symptoms. She is sexually active. It is unknown whether or not her partner has an STD. (Had stent and ballon on the left in 2012 )  Chest Pain  This is a new problem. The current episode started today. The onset quality is sudden. The problem has been unchanged. The pain is present in the substernal region. The pain is at a severity of 10/10. The pain radiates to the right arm and left arm. Associated symptoms include abdominal pain, headaches and shortness of breath (intermittent ). She has tried nitroglycerin for the symptoms. The treatment provided no relief. Risk factors include obesity.  Her past medical history is significant for hypertension. Past medical history comments: had stent and ballon on the left in 2012      Past Medical History  Diagnosis Date  . Coronary artery disease   . Sciatica   . Hypertension   . Myocardial infarct   . Asthma   . Dyslipidemia   . Tobacco abuse   . Chest pain   . Collagen vascular disease     Past Surgical History  Procedure Laterality Date  . Cesarean section    . Dilation and curettage, diagnostic / therapeutic    . Nm myocar perf wall motion  08/2009    persantine myoview - normal  perfusion in all regions, perfusion defect in anterior region (breast attenuation), EF 52%, low risk scan  . Cardiac catheterization  10/09/2003    [place], [place]) - LAD with 30% prox narrowing, 50% stenosis in mid-portion of PLA; RCA with 40% narrowing proximally ([name], [name])  . Cardiac catheterization  10/10/2007    70% stenosis in first septal perforator branch of LAD, 60-70% narrowing in mid LAD, 20% narrowing in mid AV groove Cfx with 80% diffuse narrowing in small distal marginal, total occlusion of mid RCA with L to R collaterals, 90% stenosis diffusely in prox branch of RCA followed by 70% stenosis in secondary curve & 80% stenosis in small marginal branch ([name])  . Coronary angioplasty with stent placement  10/31/2007    PCI of distal Cfx with 2.25x21mm Taxus Adam DES, 60% narrowing of mid LAD ([name])  . Cardiac catheterization  05/28/2008    normal L main, RCA with 100% prox lesion w/distal filling from LAD collaterals, LAD with 20% prox tubular lesion/ 40% mid LAD lesion/previous stent patent ([name])  . Cardiac catheterization  09/15/2009    discrete 100% osital RCA lesion, 50% prox LAD lesion, non-obstructive disease in all coronaries ([name])  . Coronary angioplasty with stent placement  06/11/2011    PCI of prox-mid LAD with 3x84mm DES Resolute ([name])    Family History  Problem Relation Age of Onset  . Leukemia Mother   . Clotting disorder Father     blood clot  . Hypertension Sister   . Stroke Sister 24  . Bleeding Disorder Son     ITP "free bleeding disorder"    Social History  Substance Use Topics  . Smoking status: Current Every Day Smoker -- 0.50 packs/day    Types: Cigarettes  . Smokeless tobacco: Not on file  . Alcohol Use: 0.0 oz/week    0 Standard drinks or equivalent per week    Allergies:  Allergies  Allergen Reactions  . Other Anaphylaxis and Hives    Fresh strawberries (throat swelled)  .  Dilaudid [Hydromorphone Hcl] Nausea And Vomiting  . Latex Swelling    No reaction with bandaids    Prescriptions prior to admission  Medication Sig Dispense Refill Last Dose  . acyclovir (ZOVIRAX) 400 MG tablet Take 400 mg by mouth daily.   Taking  . albuterol (PROVENTIL HFA;VENTOLIN HFA) 108 (90 BASE) MCG/ACT inhaler Inhale 2 puffs into the lungs every 6 (six) hours as needed for wheezing or shortness of breath.   Not Taking  . amLODipine (NORVASC) 5 MG tablet Take 10 mg by mouth daily.   Taking  . aspirin EC 325 MG tablet Take 325 mg by mouth daily.   Taking  . atorvastatin (LIPITOR) 40 MG tablet Take 1 tablet (40 mg total) by mouth daily. 30 tablet 2 Taking  . azithromycin (ZITHROMAX Z-PAK) 250 MG tablet Take 1 tablet (250 mg total) by mouth daily. 500mg  PO day 1, then 250mg  PO days 205 6 tablet 0 Taking  . clopidogrel (PLAVIX) 75 MG tablet Take 1 tablet (75 mg total) by mouth daily with breakfast. 30 tablet 2 Taking  . dicyclomine (BENTYL) 20 MG tablet Take 1 tablet (20 mg total) by mouth every 8 (eight) hours. 42 tablet 1 Taking  . fluticasone (FLOVENT HFA) 220 MCG/ACT inhaler Inhale 1 puff into the lungs 2 (two) times daily as needed (wheezing).    Taking  . fluticasone-salmeterol (ADVAIR HFA) 115-21 MCG/ACT inhaler Inhale 2 puffs into the lungs 2 (two) times daily. 1 Inhaler 12 Taking  . HYDROcodone-homatropine (HYCODAN) 5-1.5 MG/5ML syrup Take 5 mLs by mouth every 6 (six) hours as needed for cough. 120 mL 0 Taking  . lisinopril-hydrochlorothiazide (PRINZIDE,ZESTORETIC) 20-12.5 MG per tablet Take 2 tablets by mouth daily.   Taking  . metroNIDAZOLE (FLAGYL) 500 MG tablet Take 1 tablet (500 mg total) by mouth 2 (two) times daily. 14 tablet 2   . nitroGLYCERIN (NITROSTAT) 0.4 MG SL tablet Place 1 tablet (0.4 mg total) under the tongue every 5 (five) minutes as needed for chest pain. 25 tablet 6 Taking  . Oxycodone HCl 10 MG TABS Take 1 tablet (10 mg total) by mouth every 6 (six) hours as  needed. 40 tablet 0   . oxyCODONE-acetaminophen (PERCOCET/ROXICET) 5-325 MG per tablet Take 1 tablet by mouth every 6 (six) hours as needed for severe pain. 6 tablet 0 Taking  . predniSONE (DELTASONE) 20 MG tablet Take 2 tablets (40 mg total) by mouth daily. 10 tablet 0 Taking  . zolpidem (AMBIEN) 10 MG tablet Take 10 mg by mouth at bedtime as needed for sleep.   Taking    Review of Systems  Respiratory: Positive for shortness of breath (intermittent ).   Cardiovascular: Positive for chest pain.  Gastrointestinal: Positive for abdominal pain.  Genitourinary: Positive for vaginal bleeding and vaginal discharge.  Neurological: Positive for headaches.  Physical Exam   Blood pressure 173/105, pulse 71, temperature 97.9 F (36.6 C), temperature source Oral, resp. rate 20, last menstrual period 10/24/2015, SpO2 96 %.  Physical Exam  Nursing note and vitals reviewed. Constitutional: She is oriented to person, place, and time. She appears well-developed and well-nourished.  HENT:  Head: Normocephalic.  Cardiovascular: Normal rate.   Respiratory: Effort normal.  GI: Soft. There is no tenderness. There is no rebound.  Neurological: She is alert and oriented to person, place, and time.  Skin: Skin is warm and dry.  Psychiatric: She has a normal mood and affect.    MAU Course  Procedures  MDM 2337: Dr. Laneta Simmers at [facility] accepts transfer  2356: [facility] here to transfer patient  Assessment and Plan   1. Chest pain, unspecified chest pain type    Transfer to [facility] for further evaluation    Mathis Bud 11/13/2015, 11:29 PM

## 2015-11-14 ENCOUNTER — Ambulatory Visit (HOSPITAL_COMMUNITY): Payer: Medicaid Other

## 2015-11-14 ENCOUNTER — Observation Stay (HOSPITAL_COMMUNITY): Payer: Medicaid Other

## 2015-11-14 ENCOUNTER — Inpatient Hospital Stay (HOSPITAL_COMMUNITY): Payer: Medicaid Other

## 2015-11-14 ENCOUNTER — Encounter (HOSPITAL_COMMUNITY): Payer: Self-pay | Admitting: Emergency Medicine

## 2015-11-14 DIAGNOSIS — B9689 Other specified bacterial agents as the cause of diseases classified elsewhere: Secondary | ICD-10-CM | POA: Diagnosis present

## 2015-11-14 DIAGNOSIS — N760 Acute vaginitis: Secondary | ICD-10-CM | POA: Diagnosis not present

## 2015-11-14 DIAGNOSIS — R079 Chest pain, unspecified: Secondary | ICD-10-CM | POA: Insufficient documentation

## 2015-11-14 DIAGNOSIS — R102 Pelvic and perineal pain: Secondary | ICD-10-CM | POA: Diagnosis present

## 2015-11-14 DIAGNOSIS — N938 Other specified abnormal uterine and vaginal bleeding: Secondary | ICD-10-CM | POA: Diagnosis present

## 2015-11-14 DIAGNOSIS — R0789 Other chest pain: Secondary | ICD-10-CM | POA: Diagnosis present

## 2015-11-14 DIAGNOSIS — A499 Bacterial infection, unspecified: Secondary | ICD-10-CM

## 2015-11-14 DIAGNOSIS — M25511 Pain in right shoulder: Secondary | ICD-10-CM | POA: Diagnosis present

## 2015-11-14 DIAGNOSIS — R109 Unspecified abdominal pain: Secondary | ICD-10-CM | POA: Diagnosis present

## 2015-11-14 LAB — COMPREHENSIVE METABOLIC PANEL
ALK PHOS: 59 U/L (ref 38–126)
ALT: 13 U/L — ABNORMAL LOW (ref 14–54)
ANION GAP: 10 (ref 5–15)
AST: 13 U/L — ABNORMAL LOW (ref 15–41)
Albumin: 3.7 g/dL (ref 3.5–5.0)
BUN: 15 mg/dL (ref 6–20)
CALCIUM: 9.6 mg/dL (ref 8.9–10.3)
CHLORIDE: 100 mmol/L — AB (ref 101–111)
CO2: 25 mmol/L (ref 22–32)
Creatinine, Ser: 0.89 mg/dL (ref 0.44–1.00)
GFR calc non Af Amer: >60 mL/min (ref >60)
Glucose, Bld: 92 mg/dL (ref 65–99)
Potassium: 3.5 mmol/L (ref 3.5–5.1)
SODIUM: 135 mmol/L (ref 135–145)
Total Bilirubin: 0.4 mg/dL (ref 0.3–1.2)
Total Protein: 6.2 g/dL — ABNORMAL LOW (ref 6.5–8.1)

## 2015-11-14 LAB — TROPONIN I

## 2015-11-14 LAB — CBC WITH DIFFERENTIAL/PLATELET
Basophils Absolute: 0 10*3/uL (ref 0.0–0.1)
Basophils Relative: 0 %
EOS ABS: 0.2 10*3/uL (ref 0.0–0.7)
Eosinophils Relative: 2 %
HCT: 39.5 % (ref 36.0–46.0)
Hemoglobin: 13.3 g/dL (ref 12.0–15.0)
LYMPHS ABS: 2.3 10*3/uL (ref 0.7–4.0)
Lymphocytes Relative: 26 %
MCH: 28.9 pg (ref 26.0–34.0)
MCHC: 33.7 g/dL (ref 30.0–36.0)
MCV: 85.9 fL (ref 78.0–100.0)
MONOS PCT: 4 %
Monocytes Absolute: 0.4 10*3/uL (ref 0.1–1.0)
Neutro Abs: 6 10*3/uL (ref 1.7–7.7)
Neutrophils Relative %: 67 %
PLATELETS: 251 10*3/uL (ref 150–400)
RBC: 4.6 MIL/uL (ref 3.87–5.11)
RDW: 15.7 % — ABNORMAL HIGH (ref 11.5–15.5)
WBC: 8.9 10*3/uL (ref 4.0–10.5)

## 2015-11-14 LAB — URINALYSIS, ROUTINE W REFLEX MICROSCOPIC
BILIRUBIN URINE: NEGATIVE
Glucose, UA: NEGATIVE mg/dL
KETONES UR: NEGATIVE mg/dL
Leukocytes, UA: NEGATIVE
Nitrite: NEGATIVE
PROTEIN: NEGATIVE mg/dL
Specific Gravity, Urine: 1.02 (ref 1.005–1.030)
pH: 6 (ref 5.0–8.0)

## 2015-11-14 LAB — URINE MICROSCOPIC-ADD ON: WBC, UA: NONE SEEN WBC/hpf (ref 0–5)

## 2015-11-14 LAB — WET PREP, GENITAL
SPERM: NONE SEEN
Trich, Wet Prep: NONE SEEN
Yeast Wet Prep HPF POC: NONE SEEN

## 2015-11-14 LAB — GC/CHLAMYDIA PROBE AMP (~~LOC~~) NOT AT ARMC
CHLAMYDIA, DNA PROBE: NEGATIVE
NEISSERIA GONORRHEA: NEGATIVE

## 2015-11-14 LAB — I-STAT TROPONIN, ED
TROPONIN I, POC: 0.01 ng/mL (ref 0.00–0.08)
Troponin i, poc: 0.01 ng/mL (ref 0.00–0.08)

## 2015-11-14 LAB — HCG, SERUM, QUALITATIVE: PREG SERUM: NEGATIVE

## 2015-11-14 LAB — HIV ANTIBODY (ROUTINE TESTING W REFLEX): HIV Screen 4th Generation wRfx: NONREACTIVE

## 2015-11-14 LAB — LIPASE, BLOOD: LIPASE: 21 U/L (ref 11–51)

## 2015-11-14 LAB — RPR: RPR: NONREACTIVE

## 2015-11-14 MED ORDER — ACETAMINOPHEN 325 MG PO TABS: 650.0000 mg | ORAL_TABLET | ORAL | Status: DC | PRN

## 2015-11-14 MED ORDER — METRONIDAZOLE 500 MG PO TABS
500.0000 mg | ORAL_TABLET | Freq: Two times a day (BID) | ORAL | Status: DC
  Administered 2015-11-14: 500 mg via ORAL
  Filled 2015-11-14: qty 1

## 2015-11-14 MED ORDER — METRONIDAZOLE IN NACL 5-0.79 MG/ML-% IV SOLN
500.0000 mg | Freq: Two times a day (BID) | INTRAVENOUS | Status: DC
Start: 2015-11-14 — End: 2015-11-14

## 2015-11-14 MED ORDER — PANTOPRAZOLE SODIUM 40 MG IV SOLR: 40.0000 mg | INTRAVENOUS | Status: DC

## 2015-11-14 MED ORDER — KCL IN DEXTROSE-NACL 20-5-0.9 MEQ/L-%-% IV SOLN
INTRAVENOUS | Status: DC
  Filled 2015-11-14 (×2): qty 1000

## 2015-11-14 MED ORDER — ASPIRIN EC 325 MG PO TBEC
325.0000 mg | DELAYED_RELEASE_TABLET | Freq: Every day | ORAL | Status: DC
  Administered 2015-11-14: 325 mg via ORAL
  Filled 2015-11-14: qty 1

## 2015-11-14 MED ORDER — IBUPROFEN 600 MG PO TABS: 600.0000 mg | ORAL_TABLET | Freq: Four times a day (QID) | ORAL | Status: DC | PRN

## 2015-11-14 MED ORDER — ENOXAPARIN SODIUM 40 MG/0.4ML ~~LOC~~ SOLN
40.0000 mg | SUBCUTANEOUS | Status: DC
  Filled 2015-11-14: qty 0.4

## 2015-11-14 MED ORDER — ATORVASTATIN CALCIUM 40 MG PO TABS
40.0000 mg | ORAL_TABLET | Freq: Every day | ORAL | Status: DC
  Administered 2015-11-14: 40 mg via ORAL
  Filled 2015-11-14: qty 1

## 2015-11-14 MED ORDER — MORPHINE SULFATE (PF) 2 MG/ML IV SOLN: 1.0000 mg | INTRAVENOUS | Status: DC | PRN

## 2015-11-14 MED ORDER — ACYCLOVIR 400 MG PO TABS
400.0000 mg | ORAL_TABLET | Freq: Every day | ORAL | Status: DC
  Administered 2015-11-14: 400 mg via ORAL
  Filled 2015-11-14: qty 1

## 2015-11-14 MED ORDER — METRONIDAZOLE 500 MG PO TABS: 500.0000 mg | ORAL_TABLET | Freq: Two times a day (BID) | ORAL | Status: DC

## 2015-11-14 MED ORDER — KETOROLAC TROMETHAMINE 15 MG/ML IJ SOLN
15.0000 mg | Freq: Three times a day (TID) | INTRAMUSCULAR | Status: DC
  Filled 2015-11-14 (×4): qty 1

## 2015-11-14 MED ORDER — MOMETASONE FURO-FORMOTEROL FUM 100-5 MCG/ACT IN AERO
2.0000 | INHALATION_SPRAY | Freq: Two times a day (BID) | RESPIRATORY_TRACT | Status: DC
  Filled 2015-11-14: qty 8.8

## 2015-11-14 MED ORDER — LISINOPRIL 20 MG PO TABS
20.0000 mg | ORAL_TABLET | Freq: Every day | ORAL | Status: DC
  Administered 2015-11-14: 20 mg via ORAL
  Filled 2015-11-14: qty 1

## 2015-11-14 MED ORDER — PROMETHAZINE HCL 25 MG/ML IJ SOLN
25.0000 mg | Freq: Four times a day (QID) | INTRAMUSCULAR | Status: DC | PRN
Start: 2015-11-14 — End: 2015-11-14

## 2015-11-14 MED ORDER — ALBUTEROL SULFATE (2.5 MG/3ML) 0.083% IN NEBU: 3.0000 mL | INHALATION_SOLUTION | Freq: Four times a day (QID) | RESPIRATORY_TRACT | Status: DC | PRN

## 2015-11-14 MED ORDER — IOHEXOL 300 MG/ML  SOLN
100.0000 mL | Freq: Once | INTRAMUSCULAR | Status: AC | PRN
  Administered 2015-11-14: 100 mL via INTRAVENOUS

## 2015-11-14 MED ORDER — ONDANSETRON HCL 4 MG/2ML IJ SOLN
4.0000 mg | Freq: Once | INTRAMUSCULAR | Status: AC
  Administered 2015-11-14: 4 mg via INTRAVENOUS
  Filled 2015-11-14: qty 2

## 2015-11-14 MED ORDER — ONDANSETRON HCL 4 MG/2ML IJ SOLN
4.0000 mg | Freq: Four times a day (QID) | INTRAMUSCULAR | Status: DC | PRN
  Administered 2015-11-14: 4 mg via INTRAVENOUS
  Filled 2015-11-14: qty 2

## 2015-11-14 MED ORDER — GI COCKTAIL ~~LOC~~: 30.0000 mL | Freq: Four times a day (QID) | ORAL | Status: DC | PRN

## 2015-11-14 MED ORDER — PANTOPRAZOLE SODIUM 40 MG PO TBEC: 40.0000 mg | DELAYED_RELEASE_TABLET | Freq: Every day | ORAL | Status: DC

## 2015-11-14 MED ORDER — CLOPIDOGREL BISULFATE 75 MG PO TABS
75.0000 mg | ORAL_TABLET | Freq: Every day | ORAL | Status: DC
Start: 2015-11-15 — End: 2015-11-14
  Filled 2015-11-14: qty 1

## 2015-11-14 MED ORDER — MORPHINE SULFATE (PF) 4 MG/ML IV SOLN
8.0000 mg | Freq: Once | INTRAVENOUS | Status: AC
  Administered 2015-11-14: 8 mg via INTRAVENOUS
  Filled 2015-11-14: qty 2

## 2015-11-14 MED ORDER — ZOLPIDEM TARTRATE 5 MG PO TABS: 5.0000 mg | ORAL_TABLET | Freq: Every evening | ORAL | Status: DC | PRN

## 2015-11-14 MED ORDER — SERTRALINE HCL 100 MG PO TABS
100.0000 mg | ORAL_TABLET | Freq: Every day | ORAL | Status: DC
  Administered 2015-11-14: 100 mg via ORAL
  Filled 2015-11-14: qty 1

## 2015-11-14 MED ORDER — HYDROCODONE-ACETAMINOPHEN 5-325 MG PO TABS
2.0000 | ORAL_TABLET | Freq: Once | ORAL | Status: AC
  Administered 2015-11-14: 2 via ORAL
  Filled 2015-11-14: qty 2

## 2015-11-14 MED ORDER — ONDANSETRON HCL 4 MG/2ML IJ SOLN: 4.0000 mg | Freq: Four times a day (QID) | INTRAMUSCULAR | Status: DC | PRN

## 2015-11-14 MED ORDER — OXYCODONE HCL 5 MG PO TABS
5.0000 mg | ORAL_TABLET | ORAL | Status: DC | PRN
  Administered 2015-11-14: 10 mg via ORAL
  Filled 2015-11-14: qty 2

## 2015-11-14 MED ORDER — AMLODIPINE BESYLATE 10 MG PO TABS
10.0000 mg | ORAL_TABLET | Freq: Every day | ORAL | Status: DC
  Administered 2015-11-14: 10 mg via ORAL
  Filled 2015-11-14: qty 1

## 2015-11-14 MED ORDER — BUDESONIDE 0.5 MG/2ML IN SUSP: 0.5000 mg | Freq: Two times a day (BID) | RESPIRATORY_TRACT | Status: DC | PRN

## 2015-11-14 NOTE — ED Notes (Signed)
Patient transported to X-ray 

## 2015-11-14 NOTE — Progress Notes (Signed)
Consult for  Patient inability to pay for Medication  Patient has Medicaid insurance with prescription drug coverage $3.00 and less. Unable to provide any assistance.  Member: Tracey Manning, Tracey Manning [id number]  Plan: MEDICAID [place] ACCESS [246* Payor: MEDICAID [place] [[id number]]    Coverage Information    Coverage information:     Subscriber: [id number] [name],[name]     Rel to sub: 01 - Self     Member ID: [id number]     Payor: 246-MEDICAID No Name     Benefit plan: [id number]-MEDICAID [place] Ph: [phone]     Group number: Not given     Member effective dates: Not given

## 2015-11-14 NOTE — Discharge Summary (Signed)
Physician Discharge Summary  Tracey Manning [id number] DOB: 06-12-1961 DOA: 11/13/2015  PCP: [name], MD  Admit date: 11/13/2015 Discharge date: 11/14/2015  Time spent: 30 minutes  Recommendations for Outpatient Follow-up:  1. Follow-up with gynecologist regarding vaginal bleeding and recurrent bacterial vaginosis 2. Keep previously scheduled appointment with cardiologist 3. Follow up with PCP regarding right shoulder pain    Discharge Diagnoses:  Principal Problem:   Chest pain syndrome Active Problems:   Coronary artery disease   Essential hypertension   Dysfunctional uterine bleeding   Right sided abdominal pain   Pelvic pain in female   Hyperlipidemia   Right anterior shoulder pain   Bacterial vaginosis (recurrent)   Chest pain   Discharge Condition: Stable  Diet recommendation: Heart healthy  Filed Weights   11/14/15 0810  Weight: 200 lb 13.4 oz (91.1 kg)    History of present illness:  54 year old female patient with known CAD and history of multiple MI. She has prior stent to the circumflex in 2008 as well as stent to the proximal LAD in 2012. Last evaluated by cardiology July 2016 and at that time was not having chest pain. Last Myoview stress test was May 2015 and this was normal as well as 2-D echo which revealed normal LV function. Patient has history of hypertension, obesity, ongoing tobacco abuse, dyslipidemia. She reports of history of recurrent bacterial vaginosis. Patient recently developed dysfunctional uterine bleeding on 12/9 after having no menses since May 2016. She reported intermittent chest discomfort not completely typical for her prior cardiac pain at that time. She was utilizing nitroglycerin with some resolution in her pain. She was evaluated by the gynecologist at [facility] for her bleeding on 12/21. In further discussion with the patient in addition to vaginal bleeding the patient reported she has had substernal chest  discomfort that radiates both to the right and left arm associated at times with shortness of breath but no nausea or dyspnea on exertion. Because of her history of CAD and chest pain symptoms she was transported to [facility] ER. Additional symptoms clarified since arrival include: Patient was having chest pain as described above initially not typical of her previous cardiac pain which was significant substernal chest pain. Chest pain has become more frequent in the substernal area radiating to both arms as described above. She also describes "a fluttering" in her chest with this pain. Nitroglycerin has helped but has given her significant headache. In addition she's also had some right side abdominal pain with pelvic pain and has had 2 days of diarrhea. No apparent dysuria. She reports since decreasing her cigarettes that her weight is up to 207 pounds.   ER Evaluation and treatment: EKG with sinus rhythm and nonischemic Pelvic exam completed in ER with moderate white vaginal discharge noted with only scant blood within the vagina. No CMT or adnexal masses although she was tender in the right adnexal region with RLQ tenderness to palpation CT abd/pelvis with contrast revealed steatic hepatosis, no appendicitis, colonic diverticulosis; fibroid uterus Chest x-ray was unremarkable Morphine 8 mg IV 1 Zofran 4 mg IV 1 Vicodin 5-3 25 2  tablets 1  Hospital Course:  Chest pain syndrome/CAD w/ prior stents (circumflex 2008 and proximal LAD 2012) -Telemetry observation status -Initial  Point of caretroponin negative 2 with negative EKG -Heart score 4 (known CAD, HLD, age 7, cont'd tobacco abuse, obesity and HTN) -Cycled troponin x 1 more collection which was also normal -Current sxs and evaluation  Were not typical for  cardiac ischemia  Mitzi Hansen more consistent with musculoskeletal etiology -Continue aspirin and Plavix -Noted with occasional bradycardia so no beta blocker -No indication for  ECHO  Active Problems:  Essential hypertension -cont Norvasc and lisinopril/HCTZ   Dysfunctional uterine bleeding -onset bleeding 12/9 -No active bleeding at present -Hgb stable w/o evidence of anemia -Suspect patient perimenopausal -Follow-up with GYN after discharge -Fibroid noted on CT abd/pelvis   Right sided abdominal pain/Pelvic pain in female -Uncertain etiology -LFTs normal -UA unremarkable -Contrasted CT abdomen pelvis unrevealing -+ Clue cells but BV typically does not cause pain -EDP obtained RPR HIV and GC chlamydia probe with results pending at time of discharge   Hyperlipidemia -Continue statin   Right anterior shoulder pain -Appears musculoskeletal in nature -Denied injury or trauma -Right shoulder XR completed- no acute injury -IV Toradol given during hospitalization, transitioned to ibuprofen 600 mg prn pain -Recommendation follow up with primary care physician regarding this pain   Bacterial vaginosis (recurrent) -Flagyl 500 twice a day 7 days  Procedures:  None  Consultations:  None  Discharge Exam: Filed Vitals:   11/14/15 0810 11/14/15 1132  BP: 170/93 133/94  Pulse: 70 69  Temp: 98.6 F (37 C) 98.6 F (37 C)  Resp: 20 18    General: In no acute distress except for reports of right anterior shoulder discomfort and right-sided abdominal pain Cardiovascular: Regular rate and rhythm, no murmurs, no JVD, no peripheral edema Respiratory: Clear to auscultation with room air saturations and percent Abdomen: nontender except for mild persistent pelvic pain Extremities; Mild persistent right shoulder pain with AROM  Discharge Instructions   Discharge Instructions    Call MD for:  persistant nausea and vomiting    Complete by:  As directed      Call MD for:  severe uncontrolled pain    Complete by:  As directed      Call MD for:  temperature >100.4    Complete by:  As directed      Diet general    Complete by:  As directed       Increase activity slowly    Complete by:  As directed      Sexual Activity Restrictions    Complete by:  As directed   No sexual activity for one week-if pelvic pain persists then no sex until seen by gynecologist          Current Discharge Medication List    START taking these medications   Details  ibuprofen (ADVIL,MOTRIN) 600 MG tablet Take 1 tablet (600 mg total) by mouth every 6 (six) hours as needed for headache or moderate pain (take with food/snack- DO NOT take on empty stomach- May substitute e OTC Ibuprofen tabs if prefers to not obtain by prescription). Qty: 30 tablet, Refills: 0    !! metroNIDAZOLE (FLAGYL) 500 MG tablet Take 1 tablet (500 mg total) by mouth 2 (two) times daily. Qty: 14 tablet, Refills: 0    !! metroNIDAZOLE (FLAGYL) 500 MG tablet Take 1 tablet (500 mg total) by mouth every 12 (twelve) hours. Qty: 14 tablet, Refills: 0     !! - Potential duplicate medications found. Please discuss with provider.    CONTINUE these medications which have NOT CHANGED   Details  acyclovir (ZOVIRAX) 400 MG tablet Take 400 mg by mouth daily.    albuterol (PROVENTIL HFA;VENTOLIN HFA) 108 (90 BASE) MCG/ACT inhaler Inhale 2 puffs into the lungs every 6 (six) hours as needed for wheezing or shortness of breath.  amLODipine (NORVASC) 5 MG tablet Take 10 mg by mouth daily.    aspirin EC 325 MG tablet Take 325 mg by mouth daily.    atorvastatin (LIPITOR) 40 MG tablet Take 1 tablet (40 mg total) by mouth daily. Qty: 30 tablet, Refills: 2    clopidogrel (PLAVIX) 75 MG tablet Take 1 tablet (75 mg total) by mouth daily with breakfast. Qty: 30 tablet, Refills: 2    fluticasone (FLOVENT HFA) 220 MCG/ACT inhaler Inhale 1 puff into the lungs 2 (two) times daily as needed (wheezing).     fluticasone-salmeterol (ADVAIR HFA) 115-21 MCG/ACT inhaler Inhale 2 puffs into the lungs 2 (two) times daily. Qty: 1 Inhaler, Refills: 12    lisinopril-hydrochlorothiazide (PRINZIDE,ZESTORETIC)  20-12.5 MG per tablet Take 2 tablets by mouth daily.    nitroGLYCERIN (NITROSTAT) 0.4 MG SL tablet Place 1 tablet (0.4 mg total) under the tongue every 5 (five) minutes as needed for chest pain. Qty: 25 tablet, Refills: 6    sertraline (ZOLOFT) 100 MG tablet Take 100 mg by mouth daily.    zolpidem (AMBIEN) 10 MG tablet Take 10 mg by mouth at bedtime as needed for sleep.       Allergies  Allergen Reactions  . Other Anaphylaxis and Hives    Fresh strawberries (throat swelled)  . Dilaudid [Hydromorphone Hcl] Nausea And Vomiting  . Latex Swelling    No reaction with bandaids   Follow-up Information    Follow up with [facility].   Specialty:  Emergency Medicine   Why:  If symptoms worsen   Contact information:   [id number] [place] [phone]      Call [name], MD.   Specialty:  Internal Medicine   Why:  follow-up in 2-3 days   Contact information:   [place] [address] [phone]        The results of significant diagnostics from this hospitalization (including imaging, microbiology, ancillary and laboratory) are listed below for reference.    Significant Diagnostic Studies: Dg Chest 2 View  11/14/2015  CLINICAL DATA:  54 year old female with chest pain EXAM: CHEST  2 VIEW COMPARISON:  Radiograph dated 05/27/2015 FINDINGS: The heart size and mediastinal contours are within normal limits. Both lungs are clear. The visualized skeletal structures are unremarkable. IMPRESSION: No active cardiopulmonary disease. Electronically Signed   By: Anner Crete M.D.   On: 11/14/2015 01:48   Dg Shoulder Right  11/14/2015  CLINICAL DATA:  Right shoulder joint pain for 6 months. Symptoms are worsening. EXAM: RIGHT SHOULDER - 2+ VIEW COMPARISON:  Chest radiograph 11/14/2015 FINDINGS: Right shoulder is located without a fracture. Right AC joint is intact. Visualized  right ribs are intact. Minimal degenerative changes along the inferior aspect of the right glenohumeral joint. IMPRESSION: No acute abnormality. Electronically Signed   By: Markus Daft M.D.   On: 11/14/2015 13:53   Ct Abdomen Pelvis W Contrast  11/14/2015  CLINICAL DATA:  Right lower quadrant abdominal pain EXAM: CT ABDOMEN AND PELVIS WITH CONTRAST TECHNIQUE: Multidetector CT imaging of the abdomen and pelvis was performed using the standard protocol following bolus administration of intravenous contrast. CONTRAST:  118mL OMNIPAQUE IOHEXOL 300 MG/ML  SOLN COMPARISON:  02/26/2015 FINDINGS: Lower chest and abdominal wall: Coronary atherosclerosis seen in the left circumflex Hepatobiliary: Hepatic steatosis.No evidence of biliary obstruction or stone. Pancreas: Splenule touches the tail the pancreas.  No acute finding. Spleen: Unremarkable. Adrenals/Urinary Tract: 13 mm right adrenal adenoma.  18 mm low-density lesion in the right renal cortex measures greater than water density but is likely a complex cyst given water density by CT in 2014. Other renal cortical low densities are too small to characterize. No hydronephrosis or stone. Unremarkable bladder. Reproductive:Prominent cervical thickness, but stable and likely from redundant vaginal wall. 03/28/2015 sonography shows unremarkable appearing cervical architecture with nabothian cysts. Fibroids, up to 32 mm at the fundus. Stomach/Bowel: No obstruction. No appendicitis. Extensive colonic diverticulosis. Vascular/Lymphatic: Atherosclerosis with aortic and iliac tortuosity. No mass or adenopathy. Peritoneal: Trace pelvic fluid, nonspecific. Musculoskeletal: Severe facet degeneration throughout the lumbar spine. IMPRESSION: 1. No acute finding. 2. Fibroid uterus. 3. Colonic diverticulosis. Electronically Signed   By: Monte Fantasia M.D.   On: 11/14/2015 03:59    Microbiology: Recent Results (from the past 240 hour(s))  Wet prep, genital     Status: Abnormal    Collection Time: 11/14/15  2:35 AM  Result Value Ref Range Status   Yeast Wet Prep HPF POC NONE SEEN NONE SEEN Final   Trich, Wet Prep NONE SEEN NONE SEEN Final   Clue Cells Wet Prep HPF POC PRESENT (A) NONE SEEN Final   WBC, Wet Prep HPF POC FEW (A) NONE SEEN Final   Sperm NONE SEEN  Final     Labs: Basic Metabolic Panel:  Recent Labs Lab 11/14/15 0005  NA 135  K 3.5  CL 100*  CO2 25  GLUCOSE 92  BUN 15  CREATININE 0.89  CALCIUM 9.6   Liver Function Tests:  Recent Labs Lab 11/14/15 0005  AST 13*  ALT 13*  ALKPHOS 59  BILITOT 0.4  PROT 6.2*  ALBUMIN 3.7    Recent Labs Lab 11/14/15 0005  LIPASE 21   No results for input(s): AMMONIA in the last 168 hours. CBC:  Recent Labs Lab 11/14/15 0005  WBC 8.9  NEUTROABS 6.0  HGB 13.3  HCT 39.5  MCV 85.9  PLT 251   Cardiac Enzymes:  Recent Labs Lab 11/14/15 0940 11/14/15 1700  TROPONINI <0.03 <0.03   BNP: BNP (last 3 results) No results for input(s): BNP in the last 8760 hours.  ProBNP (last 3 results) No results for input(s): PROBNP in the last 8760 hours.  CBG: No results for input(s): GLUCAP in the last 168 hours.     Signed:  Erin Hearing, ANP  [facility] 11/14/2015, 6:13 PM

## 2015-11-14 NOTE — ED Notes (Signed)
Patient transported to CT 

## 2015-11-14 NOTE — ED Provider Notes (Signed)
CSN: [id number]     Arrival date & time 11/13/15  2307 History   By signing my name below, I, Forrestine Him, attest that this documentation has been prepared under the direction and in the presence of Forde Dandy, MD.  Electronically Signed: Forrestine Him, ED Scribe. 11/14/2015. 12:37 AM.   Chief Complaint  Patient presents with  . Vaginal Bleeding  . Chest Pain   The history is provided by the patient. No language interpreter was used.    HPI Comments: Tracey Manning brought in by [facility] form [facility] is a 54 y.o. female with a PMHx of CAD, HTN, and MI who presents to the Emergency Department complaining of constant, ongoing vaginal bleeding x 3 weeks. Pt states blood has been a brownish/purple color. She admits to going through 30 overnight pads in a 24 hour period. No recent follow up with OB/GYN since onset of symptoms. However, pt states she contacted her PCP but had to cancel her appointment. LNMP May 2016. Pt also reports constant, ongoing abdominal pain x 2 weeks. Associated constant diarrhea x 2 days reported that is now resolved. Pt states she had several episodes of diarrhea throughout the day earlier this week.  Pt reports intermittent, ongoing chest pain x 1 week, worsened this evening. Currently she described chest pain as "someone hitting me in the chest with a hammer". No aggravating or alleviating factors at this time. 1 dose of Nitro attempted prior to arrival. In addition, pt states she has been taking 3 Nitro tabs daily since onset of chest pain. Ms. Leffers also reports intermittent, ongoing R shoulder pain today after taking nitro. Discomfort is made worse with deep palpation and movement over shoulder but chest pain is not reproducible. No alleviating factors at this time. No recent injury, trauma, heavy lifting, or fall. Pt denies any fever, chills, or shortness of breath.  PCP: [name], MD    Past Medical History  Diagnosis Date  . Coronary artery disease   .  Sciatica   . Hypertension   . Myocardial infarct ([place])   . Asthma   . Dyslipidemia   . Tobacco abuse   . Chest pain   . Collagen vascular disease [facility])    Past Surgical History  Procedure Laterality Date  . Cesarean section    . Dilation and curettage, diagnostic / therapeutic    . Nm myocar perf wall motion  08/2009    persantine myoview - normal perfusion in all regions, perfusion defect in anterior region (breast attenuation), EF 52%, low risk scan  . Cardiac catheterization  10/09/2003    [facility] [place], [place]) - LAD with 30% prox narrowing, 50% stenosis in mid-portion of PLA; RCA with 40% narrowing proximally ([name], [name])  . Cardiac catheterization  10/10/2007    70% stenosis in first septal perforator branch of LAD, 60-70% narrowing in mid LAD, 20% narrowing in mid AV groove Cfx with 80% diffuse narrowing in small distal marginal, total occlusion of mid RCA with L to R collaterals, 90% stenosis diffusely in prox branch of RCA followed by 70% stenosis in secondary curve & 80% stenosis in small marginal branch ([name])  . Coronary angioplasty with stent placement  10/31/2007    PCI of distal Cfx with 2.25x32mm Taxus Adam DES, 60% narrowing of mid LAD ([name])  . Cardiac catheterization  05/28/2008    normal L main, RCA with 100% prox lesion w/distal filling from LAD collaterals, LAD with 20% prox  tubular lesion/ 40% mid LAD lesion/previous stent patent ([name])  . Cardiac catheterization  09/15/2009    discrete 100% osital RCA lesion, 50% prox LAD lesion, non-obstructive disease in all coronaries ([name])  . Coronary angioplasty with stent placement  06/11/2011    PCI of prox-mid LAD with 3x67mm DES Resolute ([name])   Family History  Problem Relation Age of Onset  . Leukemia Mother   . Clotting disorder Father     blood clot  . Hypertension Sister   . Stroke Sister 71  . Bleeding Disorder Son     ITP "free bleeding disorder"    Social History  Substance Use Topics  . Smoking status: Current Every Day Smoker -- 0.50 packs/day    Types: Cigarettes  . Smokeless tobacco: None  . Alcohol Use: 0.0 oz/week    0 Standard drinks or equivalent per week   OB History    No data available     Review of Systems  Constitutional: Negative for fever and chills.  Respiratory: Negative for shortness of breath.   Cardiovascular: Positive for chest pain.  Gastrointestinal: Positive for abdominal pain and diarrhea.  Genitourinary: Positive for vaginal bleeding and vaginal discharge.  Musculoskeletal: Positive for arthralgias.  Neurological: Negative for weakness and numbness.  Psychiatric/Behavioral: Negative for confusion.  All other systems reviewed and are negative.     Allergies  Other; Dilaudid; and Latex  Home Medications   Prior to Admission medications   Medication Sig Start Date End Date Taking? Authorizing Provider  acyclovir (ZOVIRAX) 400 MG tablet Take 400 mg by mouth daily.   Yes Historical Provider, MD  albuterol (PROVENTIL HFA;VENTOLIN HFA) 108 (90 BASE) MCG/ACT inhaler Inhale 2 puffs into the lungs every 6 (six) hours as needed for wheezing or shortness of breath.   Yes Historical Provider, MD  amLODipine (NORVASC) 5 MG tablet Take 10 mg by mouth daily.   Yes Historical Provider, MD  aspirin EC 325 MG tablet Take 325 mg by mouth daily.   Yes Historical Provider, MD  atorvastatin (LIPITOR) 40 MG tablet Take 1 tablet (40 mg total) by mouth daily. 01/04/14  Yes [name], MD  clopidogrel (PLAVIX) 75 MG tablet Take 1 tablet (75 mg total) by mouth daily with breakfast. 01/04/14  Yes [name], MD  fluticasone (FLOVENT HFA) 220 MCG/ACT inhaler Inhale 1 puff into the lungs 2 (two) times daily as needed (wheezing).    Yes Historical Provider, MD  fluticasone-salmeterol (ADVAIR HFA) 115-21 MCG/ACT inhaler Inhale 2 puffs into the lungs 2 (two) times daily. 01/04/14  Yes [name], MD   lisinopril-hydrochlorothiazide (PRINZIDE,ZESTORETIC) 20-12.5 MG per tablet Take 2 tablets by mouth daily.   Yes Historical Provider, MD  nitroGLYCERIN (NITROSTAT) 0.4 MG SL tablet Place 1 tablet (0.4 mg total) under the tongue every 5 (five) minutes as needed for chest pain. 06/05/15  Yes [name], MD  sertraline (ZOLOFT) 100 MG tablet Take 100 mg by mouth daily.   Yes Historical Provider, MD  zolpidem (AMBIEN) 10 MG tablet Take 10 mg by mouth at bedtime as needed for sleep.   Yes Historical Provider, MD  metroNIDAZOLE (FLAGYL) 500 MG tablet Take 1 tablet (500 mg total) by mouth 2 (two) times daily. 11/14/15   Forde Dandy, MD   Triage Vitals: BP 128/76 mmHg  Pulse 55  Temp(Src) 97.9 F (36.6 C) (Oral)  Resp 21  SpO2 95%  LMP 10/24/2015   Physical Exam  Constitutional:  She is oriented to person, place, and time. She appears well-developed and well-nourished. No distress.  HENT:  Head: Normocephalic and atraumatic.  Eyes: EOM are normal.  Neck: Normal range of motion.  Cardiovascular: Normal rate, regular rhythm and normal heart sounds.   Pulses:      Dorsalis pedis pulses are 2+ on the right side, and 2+ on the left side.  Pulmonary/Chest: Effort normal and breath sounds normal. No respiratory distress. She has no wheezes. She has no rales.  Tenderness to palpation over R shoulder to R anterior chest wall No deformity noted   Abdominal: Soft. She exhibits no distension. There is tenderness.  Tenderness to periumbilical to lower abdomen  Musculoskeletal: Normal range of motion. She exhibits no edema.  Neurological: She is alert and oriented to person, place, and time.  Skin: Skin is warm and dry.  Psychiatric: She has a normal mood and affect. Judgment normal.  Nursing note and vitals reviewed.  Pelvic: Normal external genitalia. Normal internal genitalia. Moderate white vaginaldischarge. Scant blood within the vagina. No active bleeding. No cervical motion tenderness. No  adnexal masses. Right adnexal and RLQ tenderness to palpation  ED Course  Procedures (including critical care time)  DIAGNOSTIC STUDIES: Oxygen Saturation is 98% on RA, Normal by my interpretation.    COORDINATION OF CARE: 12:21 AM- Will give Morphine and Zofran. Will order CXR, RPR, HIV antibody, CBC, CMP, Lipase, urinalysis, hCG, and i-stat troponin. Discussed treatment plan with pt at bedside and pt agreed to plan.     Labs Review Labs Reviewed  WET PREP, GENITAL - Abnormal; Notable for the following:    Clue Cells Wet Prep HPF POC PRESENT (*)    WBC, Wet Prep HPF POC FEW (*)    All other components within normal limits  CBC WITH DIFFERENTIAL/PLATELET - Abnormal; Notable for the following:    RDW 15.7 (*)    All other components within normal limits  COMPREHENSIVE METABOLIC PANEL - Abnormal; Notable for the following:    Chloride 100 (*)    Total Protein 6.2 (*)    AST 13 (*)    ALT 13 (*)    All other components within normal limits  URINALYSIS, ROUTINE W REFLEX MICROSCOPIC (NOT AT [facility]) - Abnormal; Notable for the following:    APPearance CLOUDY (*)    Hgb urine dipstick TRACE (*)    All other components within normal limits  URINE MICROSCOPIC-ADD ON - Abnormal; Notable for the following:    Squamous Epithelial / LPF 0-5 (*)    Bacteria, UA RARE (*)    All other components within normal limits  LIPASE, BLOOD  HCG, SERUM, QUALITATIVE  RPR  HIV ANTIBODY (ROUTINE TESTING)  TROPONIN I  TROPONIN I  TROPONIN I  I-STAT TROPOININ, ED  I-STAT TROPOININ, ED  GC/CHLAMYDIA PROBE AMP ([place]) NOT AT [facility]    Imaging Review Dg Chest 2 View  11/14/2015  CLINICAL DATA:  54 year old female with chest pain EXAM: CHEST  2 VIEW COMPARISON:  Radiograph dated 05/27/2015 FINDINGS: The heart size and mediastinal contours are within normal limits. Both lungs are clear. The visualized skeletal structures are unremarkable. IMPRESSION: No active cardiopulmonary disease. Electronically  Signed   By: Anner Crete M.D.   On: 11/14/2015 01:48   Ct Abdomen Pelvis W Contrast  11/14/2015  CLINICAL DATA:  Right lower quadrant abdominal pain EXAM: CT ABDOMEN AND PELVIS WITH CONTRAST TECHNIQUE: Multidetector CT imaging of the abdomen and pelvis was performed using the standard protocol  following bolus administration of intravenous contrast. CONTRAST:  125mL OMNIPAQUE IOHEXOL 300 MG/ML  SOLN COMPARISON:  02/26/2015 FINDINGS: Lower chest and abdominal wall: Coronary atherosclerosis seen in the left circumflex Hepatobiliary: Hepatic steatosis.No evidence of biliary obstruction or stone. Pancreas: Splenule touches the tail the pancreas.  No acute finding. Spleen: Unremarkable. Adrenals/Urinary Tract: 13 mm right adrenal adenoma. 18 mm low-density lesion in the right renal cortex measures greater than water density but is likely a complex cyst given water density by CT in 2014. Other renal cortical low densities are too small to characterize. No hydronephrosis or stone. Unremarkable bladder. Reproductive:Prominent cervical thickness, but stable and likely from redundant vaginal wall. 03/28/2015 sonography shows unremarkable appearing cervical architecture with nabothian cysts. Fibroids, up to 32 mm at the fundus. Stomach/Bowel: No obstruction. No appendicitis. Extensive colonic diverticulosis. Vascular/Lymphatic: Atherosclerosis with aortic and iliac tortuosity. No mass or adenopathy. Peritoneal: Trace pelvic fluid, nonspecific. Musculoskeletal: Severe facet degeneration throughout the lumbar spine. IMPRESSION: 1. No acute finding. 2. Fibroid uterus. 3. Colonic diverticulosis. Electronically Signed   By: Monte Fantasia M.D.   On: 11/14/2015 03:59   I have personally reviewed and evaluated these images and lab results as part of my medical decision-making.   EKG Interpretation   Date/Time:  Thursday November 14 2015 04:05:26 EST Ventricular Rate:  63 PR Interval:  172 QRS Duration: 100 QT  Interval:  443 QTC Calculation: 453 R Axis:   55 Text Interpretation:  Sinus rhythm Consider left atrial enlargement No  significant change since last tracing Confirmed by LIU MD, DANA [phone]) on  11/14/2015 4:44:39 AM      MDM   Final diagnoses:  Chest pain, unspecified chest pain type  Uterine leiomyoma, unspecified location  Vaginal bleeding  Bacterial vaginosis  Right shoulder pain    54 year old female with extensive cardiac history who presents with intermittent chest pain as well as ongoing abdominal pain and vaginal bleeding. Is tearful on presentation, stating that she is having severe chest pain and abdominal pain.She is hemodynamically stable. Her abdomen is soft and non-peritoneal, with initially reported diffuse tenderness to palpation. Cardiopulmonary exam is unremarkable. The pain over her right shoulder is reproducible with palpation and movement, and seems more musculoskeletal in nature. States that her central chest pressure though is not reproducible. EKG does not show acute ischemic changes. Serial troponins are negative and repeat EKG does not show dynamic changes. Chest x-ray without acute cardiopulmonary processes. Presentation not concerning for that of PE or dissection. Pain improves with analgesics. Chest pain resolves but right shoulder pain persists. Given increased frequency of chest pain with strong cardiac history, will obs admit for cardiac rule out.   In regards to her abdominal pain and vaginal bleeding, she does not have active bleeding noted, and she has stable hemoglobin. Scant blood is noted within the vagina, she is noted to have some significant right adnexal tenderness.CT shows uterine fibroid, likely cause of her pain and bleeding. She will follow-up with her gynecologist regarding this.      I personally performed the services described in this documentation, which was scribed in my presence. The recorded information has been reviewed and is  accurate.   Forde Dandy, MD 11/14/15 [phone]

## 2015-11-14 NOTE — ED Notes (Addendum)
Pt presents with [facility] from [facility] for post menapausal bleeding and CP intermittent x 1 weeks; pt CAOx4 at this time; [facility] reports staff at [facility] obtained IV access and patient took own NTG x 1 without relief (pt reports HA); pt reports changing pads Q1hr x 2 days

## 2015-11-14 NOTE — ED Notes (Signed)
Liu, MD at bedside.

## 2015-11-14 NOTE — [facility] Note (Signed)
Patient discharged from [facility] to transfer to [facility]. Patient verbalized understanding. Belongings sent with patient.

## 2015-11-14 NOTE — Plan of Care (Signed)
Was called for admission by Dr. [name] with regarding to patient Tracey Manning, 54 year old female with known history of CAD presenting with chest pain and abdominal pain and vaginal bleeding. CT abdomen and pelvis shows uterine fibroid probably causing the vaginal bleed. Patient's chest pain at this time has resolved but given the history of CAD patient will be admitted for further workup for chest pain. Cardiac markers and EKG were unremarkable.  Tracey Manning.

## 2015-11-14 NOTE — H&P (Signed)
[facility] History and Physical                                                                                    Tracey Manning, is a 54 y.o. female  MRN: [id number]   DOB - 01/12/1961  Admit Date - 11/13/2015  Outpatient Primary MD for the patient is Tracey Fendt, MD  Referring MD: [name] / ER  PMH: Past Medical History  Diagnosis Date  . Coronary artery disease   . Sciatica   . Hypertension   . Myocardial infarct ([place])   . Asthma   . Dyslipidemia   . Tobacco abuse   . Chest pain   . Collagen vascular disease (HCC)       PSH: Past Surgical History  Procedure Laterality Date  . Cesarean section    . Dilation and curettage, diagnostic / therapeutic    . Nm myocar perf wall motion  08/2009    persantine myoview - normal perfusion in all regions, perfusion defect in anterior region (breast attenuation), EF 52%, low risk scan  . Cardiac catheterization  10/09/2003    [facility] [place], [place]) - LAD with 30% prox narrowing, 50% stenosis in mid-portion of PLA; RCA with 40% narrowing proximally ([name], [name])  . Cardiac catheterization  10/10/2007    70% stenosis in first septal perforator branch of LAD, 60-70% narrowing in mid LAD, 20% narrowing in mid AV groove Cfx with 80% diffuse narrowing in small distal marginal, total occlusion of mid RCA with L to R collaterals, 90% stenosis diffusely in prox branch of RCA followed by 70% stenosis in secondary curve & 80% stenosis in small marginal branch ([name])  . Coronary angioplasty with stent placement  10/31/2007    PCI of distal Cfx with 2.25x62mm Taxus Adam DES, 60% narrowing of mid LAD ([name])  . Cardiac catheterization  05/28/2008    normal L main, RCA with 100% prox lesion w/distal filling from LAD collaterals, LAD with 20% prox tubular lesion/ 40% mid LAD lesion/previous stent patent ([name])  . Cardiac catheterization  09/15/2009    discrete 100% osital RCA lesion, 50% prox LAD  lesion, non-obstructive disease in all coronaries ([name])  . Coronary angioplasty with stent placement  06/11/2011    PCI of prox-mid LAD with 3x9mm DES Resolute ([name])     CC:  Chief Complaint  Patient presents with  . Vaginal Bleeding  . Chest Pain     HPI: 54 year old female patient with known CAD and history of multiple MI. She has prior stent to the circumflex in 2008 as well as stent to the proximal LAD in 2012. Last evaluated by cardiology July 2016 and at that time was not having chest pain. Last Myoview stress test was May 2015 and this was normal as well as 2-D echo which revealed normal LV function. Patient has history of hypertension, obesity, ongoing tobacco abuse, dyslipidemia. She reports of history of recurrent bacterial vaginosis. Patient recently developed dysfunctional uterine bleeding on 12/9 after having no menses since May 2016. She reported intermittent chest discomfort not completely typical for her prior cardiac  pain at that time. She was utilizing nitroglycerin with some resolution in her pain. She was evaluated by the gynecologist at [facility] for her bleeding on 12/21. In further discussion with the patient in addition to vaginal bleeding the patient reported she has had substernal chest discomfort that radiates both to the right and left arm associated at times with shortness of breath but no nausea or dyspnea on exertion. Because of her history of CAD and chest pain symptoms she was transported to [name] ER. Additional symptoms clarified since arrival include: Patient was having chest pain as described above initially not typical of her previous cardiac pain which was significant substernal chest pain. Chest pain has become more frequent in the substernal area radiating to both arms as described above. She also describes "a fluttering" in her chest with this pain. Nitroglycerin has helped but has given her significant headache. In addition  she's also had some right side abdominal pain with pelvic pain and has had 2 days of diarrhea. No apparent dysuria. She reports since decreasing her cigarettes that her weight is up to 207 pounds.  ER Evaluation and treatment: EKG with sinus rhythm and nonischemic Pelvic exam completed in ER with moderate white vaginal discharge noted with only scant blood within the vagina. No CMT or adnexal masses although she was tender in the right adnexal region with RLQ tenderness to palpation CT abd/pelvis with contrast revealed steatic hepatosis, no appendicitis, colonic diverticulosis; fibroid uterus Chest x-ray was unremarkable Morphine 8 mg IV 1 Zofran 4 mg IV 1 Vicodin 5-3 25 2  tablets 1  Review of Systems   In addition to the HPI above,  No Fever-chills, myalgias or other constitutional symptoms No changes with Vision or hearing, new weakness, tingling, numbness in any extremity, No problems swallowing food or Liquids, indigestion/reflux No Cough or Shortness of Breath, palpitations, orthopnea or DOE No  N/V; no melena or hematochezia, no dark tarry stools No dysuria, hematuria or flank pain No new skin rashes, lesions, masses or bruises, No new joints pains-aches No recent weight gain or loss No polyuria, polydypsia or polyphagia,  *A full 10 point Review of Systems was done, except as stated above, all other Review of Systems were negative.  Social History Social History  Substance Use Topics  . Smoking status: Current Every Day Smoker -- 0.50 packs/day    Types: Cigarettes  . Smokeless tobacco: Not on file  . Alcohol Use: 0.0 oz/week    0 Standard drinks or equivalent per week    Resides at: Private residence  Lives with: Boyfriend  Ambulatory status: Without assistive devices   Family History Family History  Problem Relation Age of Onset  . Leukemia Mother   . Clotting disorder Father     blood clot  . Hypertension Sister   . Stroke Sister 25  . Bleeding Disorder  Son     ITP "free bleeding disorder"     Prior to Admission medications   Medication Sig Start Date End Date Taking? Authorizing Provider  acyclovir (ZOVIRAX) 400 MG tablet Take 400 mg by mouth daily.   Yes Historical Provider, MD  albuterol (PROVENTIL HFA;VENTOLIN HFA) 108 (90 BASE) MCG/ACT inhaler Inhale 2 puffs into the lungs every 6 (six) hours as needed for wheezing or shortness of breath.   Yes Historical Provider, MD  amLODipine (NORVASC) 5 MG tablet Take 10 mg by mouth daily.   Yes Historical Provider, MD  aspirin EC 325 MG tablet Take 325 mg by  mouth daily.   Yes Historical Provider, MD  atorvastatin (LIPITOR) 40 MG tablet Take 1 tablet (40 mg total) by mouth daily. 01/04/14  Yes [name], MD  clopidogrel (PLAVIX) 75 MG tablet Take 1 tablet (75 mg total) by mouth daily with breakfast. 01/04/14  Yes [name], MD  fluticasone (FLOVENT HFA) 220 MCG/ACT inhaler Inhale 1 puff into the lungs 2 (two) times daily as needed (wheezing).    Yes Historical Provider, MD  fluticasone-salmeterol (ADVAIR HFA) 115-21 MCG/ACT inhaler Inhale 2 puffs into the lungs 2 (two) times daily. 01/04/14  Yes [name], MD  lisinopril-hydrochlorothiazide (PRINZIDE,ZESTORETIC) 20-12.5 MG per tablet Take 2 tablets by mouth daily.   Yes Historical Provider, MD  nitroGLYCERIN (NITROSTAT) 0.4 MG SL tablet Place 1 tablet (0.4 mg total) under the tongue every 5 (five) minutes as needed for chest pain. 06/05/15  Yes [name], MD  sertraline (ZOLOFT) 100 MG tablet Take 100 mg by mouth daily.   Yes Historical Provider, MD  zolpidem (AMBIEN) 10 MG tablet Take 10 mg by mouth at bedtime as needed for sleep.   Yes Historical Provider, MD  metroNIDAZOLE (FLAGYL) 500 MG tablet Take 1 tablet (500 mg total) by mouth 2 (two) times daily. 11/14/15   Forde Dandy, MD    Allergies  Allergen Reactions  . Other Anaphylaxis and Hives    Fresh strawberries (throat swelled)  . Dilaudid [Hydromorphone Hcl] Nausea And  Vomiting  . Latex Swelling    No reaction with bandaids    Physical Exam  Vitals  Blood pressure 128/76, pulse 55, temperature 97.9 F (36.6 C), temperature source Oral, resp. rate 21, last menstrual period 10/24/2015, SpO2 95 %.   General:  In no acute distress initially although began complaining of significant abdominal pain after abdominal exam completed, otherwise appears healthy and well nourished  Psych:  Normal affect, Denies Suicidal or Homicidal ideations, Awake Alert, Oriented X 3. Speech and thought patterns are clear and appropriate, no apparent short term memory deficits  Neuro:   No focal neurological deficits, CN II through XII intact, Strength 5/5 all 4 extremities, Sensation intact all 4 extremities.  ENT:  Ears and Eyes appear Normal, Conjunctivae clear, PER. Moist oral mucosa without erythema or exudates.  Neck:  Supple, No lymphadenopathy appreciated  Respiratory:  Symmetrical chest wall movement, Good air movement bilaterally, CTAB. Room Air-see below regarding right-sided reproducible chest wall tenderness  Cardiac:  RRR, No Murmurs, no LE edema noted, no JVD, No carotid bruits, peripheral pulses palpable at 2+  Abdomen:  Positive but hypoactive bowel sounds, Soft, exquisitely tender right side right lower quadrant and suprapubic region tender, Non distended,  No masses appreciated, no obvious hepatosplenomegaly  Skin:  No Cyanosis, Normal Skin Turgor, No Skin Rash or Bruise.  Extremities: Symmetrical without obvious trauma or injury,  no effusions. Patient exquisitely tender to palpation with palpation over right anterior chest wall and right shoulder and pain also reproducible with passive range of motion of right arm  Data Review  CBC  Recent Labs Lab 11/14/15 0005  WBC 8.9  HGB 13.3  HCT 39.5  PLT 251  MCV 85.9  MCH 28.9  MCHC 33.7  RDW 15.7*  LYMPHSABS 2.3  MONOABS 0.4  EOSABS 0.2  BASOSABS 0.0    Chemistries   Recent Labs Lab  11/14/15 0005  NA 135  K 3.5  CL 100*  CO2 25  GLUCOSE 92  BUN 15  CREATININE 0.89  CALCIUM 9.6  AST 13*  ALT 13*  ALKPHOS 59  BILITOT 0.4    CrCl cannot be calculated (Unknown ideal weight.).  No results for input(s): TSH, T4TOTAL, T3FREE, THYROIDAB in the last 72 hours.  Invalid input(s): FREET3  Coagulation profile No results for input(s): INR, PROTIME in the last 168 hours.  No results for input(s): DDIMER in the last 72 hours.  Cardiac Enzymes No results for input(s): CKMB, TROPONINI, MYOGLOBIN in the last 168 hours.  Invalid input(s): CK  Invalid input(s): POCBNP  Urinalysis    Component Value Date/Time   COLORURINE YELLOW 11/14/2015 0058   APPEARANCEUR CLOUDY* 11/14/2015 0058   LABSPEC 1.020 11/14/2015 0058   PHURINE 6.0 11/14/2015 0058   GLUCOSEU NEGATIVE 11/14/2015 0058   HGBUR TRACE* 11/14/2015 0058   BILIRUBINUR NEGATIVE 11/14/2015 0058   KETONESUR NEGATIVE 11/14/2015 0058   PROTEINUR NEGATIVE 11/14/2015 0058   UROBILINOGEN 0.2 03/28/2015 1236   NITRITE NEGATIVE 11/14/2015 0058   LEUKOCYTESUR NEGATIVE 11/14/2015 0058    Imaging results:   Dg Chest 2 View  11/14/2015  CLINICAL DATA:  54 year old female with chest pain EXAM: CHEST  2 VIEW COMPARISON:  Radiograph dated 05/27/2015 FINDINGS: The heart size and mediastinal contours are within normal limits. Both lungs are clear. The visualized skeletal structures are unremarkable. IMPRESSION: No active cardiopulmonary disease. Electronically Signed   By: Anner Crete M.D.   On: 11/14/2015 01:48   Ct Abdomen Pelvis W Contrast  11/14/2015  CLINICAL DATA:  Right lower quadrant abdominal pain EXAM: CT ABDOMEN AND PELVIS WITH CONTRAST TECHNIQUE: Multidetector CT imaging of the abdomen and pelvis was performed using the standard protocol following bolus administration of intravenous contrast. CONTRAST:  131mL OMNIPAQUE IOHEXOL 300 MG/ML  SOLN COMPARISON:  02/26/2015 FINDINGS: Lower chest and abdominal  wall: Coronary atherosclerosis seen in the left circumflex Hepatobiliary: Hepatic steatosis.No evidence of biliary obstruction or stone. Pancreas: Splenule touches the tail the pancreas.  No acute finding. Spleen: Unremarkable. Adrenals/Urinary Tract: 13 mm right adrenal adenoma. 18 mm low-density lesion in the right renal cortex measures greater than water density but is likely a complex cyst given water density by CT in 2014. Other renal cortical low densities are too small to characterize. No hydronephrosis or stone. Unremarkable bladder. Reproductive:Prominent cervical thickness, but stable and likely from redundant vaginal wall. 03/28/2015 sonography shows unremarkable appearing cervical architecture with nabothian cysts. Fibroids, up to 32 mm at the fundus. Stomach/Bowel: No obstruction. No appendicitis. Extensive colonic diverticulosis. Vascular/Lymphatic: Atherosclerosis with aortic and iliac tortuosity. No mass or adenopathy. Peritoneal: Trace pelvic fluid, nonspecific. Musculoskeletal: Severe facet degeneration throughout the lumbar spine. IMPRESSION: 1. No acute finding. 2. Fibroid uterus. 3. Colonic diverticulosis. Electronically Signed   By: Monte Fantasia M.D.   On: 11/14/2015 03:59     EKG: (Independently reviewed) sinus rhythm with QTC 453 mainly seconds and no ischemic changes.   Assessment & Plan  Principal Problem:   Chest pain syndrome/CAD w/ prior stents (circumflex 2008 and proximal LAD 2012) -Telemetry observation status -Initial troponin negative 2 with negative EKG -Heart score 4 (known CAD, HLD, age 58, cont'd tobacco abuse, obesity and HTN) -Cycle troponin x 1 more collection -Current sxs and evaluation do not appear typical for cardiac ischemia although pain was relieved with nitroglycerin and at times was associated with shortness of breath -Continue aspirin and Plavix -Noted with occasional bradycardia so no beta blocker -No indication for full anticoagulation or  ECHO  Active Problems:   Essential hypertension -cont Norvasc and lisinopril but hold thiazide diuretic  Dysfunctional uterine bleeding -onset bleeding 12/9 -No active bleeding at present -Hgb stable w/o evidence of anemia -Suspect patient perimenopausal -Follow-up with GYN after discharge -Fibroid noted on CT abd/pelvis    Right sided abdominal pain/Pelvic pain in female -Uncertain etiology -LFTs normal -UA unremarkable -Contrasted CT abdomen pelvis unrevealing -+ Clue cells but BV typically does not cause pain -EDP obtained RPR HIV and GC chlamydia probe -Oxycodone and Tylenol while in ER    Hyperlipidemia -Continue statin    Right anterior shoulder pain -Appears musculoskeletal in nature -Denies injury or trauma -Scheduled IV Toradol then PO NSAIDs after dc    Bacterial vaginosis (recurrent) -Flagyl 500 twice a day 7 days    DVT Prophylaxis: Lovenox  Family Communication:   No family at bedside  Code Status: Full code   Condition:  Stable  Discharge disposition: Anticipate discharge today if 3rd TNI negative Time spent in minutes : 60      Roy Snuffer L. ANP on 11/14/2015 at 7:52 AM  You may contact me by going to [email or web address] - password [email or web address]  I am available from 7a-7p but please confirm I am on the schedule by going to Amion as above.   After 7p please contact night coverage person covering me after hours  [facility]

## 2015-11-14 NOTE — Progress Notes (Signed)
Pt has orders to be discharged. Discharge instructions given and pt has no additional questions at this time. Medication regimen reviewed and pt educated. Pt verbalized understanding and has no additional questions. Telemetry box removed. IV removed and site in good condition. Pt stable and waiting for transportation. 

## 2015-11-14 NOTE — Discharge Instructions (Signed)
Bacterial Vaginosis Bacterial vaginosis is an infection of the vagina. It happens when too many germs (bacteria) grow in the vagina. Having this infection puts you at risk for getting other infections from sex. Treating this infection can help lower your risk for other infections, such as:   Chlamydia.  Gonorrhea.  HIV.  Herpes. HOME CARE  Take your medicine as told by your doctor.  Finish your medicine even if you start to feel better.  Tell your sex partner that you have an infection. They should see their doctor for treatment.  During treatment:  Avoid sex or use condoms correctly.  Do not douche.  Do not drink alcohol unless your doctor tells you it is ok.  Do not breastfeed unless your doctor tells you it is ok. GET HELP IF:  You are not getting better after 3 days of treatment.  You have more grey fluid (discharge) coming from your vagina than before.  You have more pain than before.  You have a fever. MAKE SURE YOU:   Understand these instructions.  Will watch your condition.  Will get help right away if you are not doing well or get worse.   This information is not intended to replace advice given to you by your health care provider. Make sure you discuss any questions you have with your health care provider.   Document Released: 08/18/2008 Document Revised: 11/30/2014 Document Reviewed: 06/21/2013 Elsevier Interactive Patient Education 2016 Else[employer]  Shoulder Pain The shoulder is the joint that connects your arms to your body. The bones that form the shoulder joint include the upper arm bone (humerus), the shoulder blade (scapula), and the collarbone (clavicle). The top of the humerus is shaped like a ball and fits into a rather flat socket on the scapula (glenoid cavity). A combination of muscles and strong, fibrous tissues that connect muscles to bones (tendons) support your shoulder joint and hold the ball in the socket. Small, fluid-filled sacs  (bursae) are located in different areas of the joint. They act as cushions between the bones and the overlying soft tissues and help reduce friction between the gliding tendons and the bone as you move your arm. Your shoulder joint allows a wide range of motion in your arm. This range of motion allows you to do things like scratch your back or throw a ball. However, this range of motion also makes your shoulder more prone to pain from overuse and injury. Causes of shoulder pain can originate from both injury and overuse and usually can be grouped in the following four categories:  Redness, swelling, and pain (inflammation) of the tendon (tendinitis) or the bursae (bursitis).  Instability, such as a dislocation of the joint.  Inflammation of the joint (arthritis).  Broken bone (fracture). HOME CARE INSTRUCTIONS   Apply ice to the sore area.  Put ice in a plastic bag.  Place a towel between your skin and the bag.  Leave the ice on for 15-20 minutes, 3-4 times per day for the first 2 days, or as directed by your health care provider.  Stop using cold packs if they do not help with the pain.  If you have a shoulder sling or immobilizer, wear it as long as your caregiver instructs. Only remove it to shower or bathe. Move your arm as little as possible, but keep your hand moving to prevent swelling.  Squeeze a soft ball or foam pad as much as possible to help prevent swelling.  Only take over-the-counter or  prescription medicines for pain, discomfort, or fever as directed by your caregiver. SEEK MEDICAL CARE IF:   Your shoulder pain increases, or new pain develops in your arm, hand, or fingers.  Your hand or fingers become cold and numb.  Your pain is not relieved with medicines. SEEK IMMEDIATE MEDICAL CARE IF:   Your arm, hand, or fingers are numb or tingling.  Your arm, hand, or fingers are significantly swollen or turn white or blue. MAKE SURE YOU:   Understand these  instructions.  Will watch your condition.  Will get help right away if you are not doing well or get worse.   This information is not intended to replace advice given to you by your health care provider. Make sure you discuss any questions you have with your health care provider.   Document Released: 08/19/2005 Document Revised: 11/30/2014 Document Reviewed: 03/04/2015 Elsevier Interactive Patient Education [employer].

## 2015-11-20 ENCOUNTER — Ambulatory Visit (INDEPENDENT_AMBULATORY_CARE_PROVIDER_SITE_OTHER): Payer: Medicaid Other | Admitting: Obstetrics

## 2015-11-20 ENCOUNTER — Encounter: Payer: Self-pay | Admitting: Obstetrics

## 2015-11-20 VITALS — BP 171/103 | HR 69 | Temp 97.6°F | Wt 210.0 lb

## 2015-11-20 DIAGNOSIS — N739 Female pelvic inflammatory disease, unspecified: Secondary | ICD-10-CM | POA: Diagnosis not present

## 2015-11-20 DIAGNOSIS — N760 Acute vaginitis: Secondary | ICD-10-CM | POA: Diagnosis not present

## 2015-11-20 DIAGNOSIS — A499 Bacterial infection, unspecified: Secondary | ICD-10-CM | POA: Diagnosis not present

## 2015-11-20 DIAGNOSIS — B9689 Other specified bacterial agents as the cause of diseases classified elsewhere: Secondary | ICD-10-CM

## 2015-11-20 DIAGNOSIS — R102 Pelvic and perineal pain: Secondary | ICD-10-CM

## 2015-11-20 MED ORDER — DOXYCYCLINE HYCLATE 50 MG PO CAPS: 50.0000 mg | ORAL_CAPSULE | Freq: Two times a day (BID) | ORAL | Status: DC

## 2015-11-20 MED ORDER — METRONIDAZOLE 0.75 % VA GEL: 1.0000 | Freq: Two times a day (BID) | VAGINAL | Status: DC

## 2015-11-20 NOTE — Progress Notes (Signed)
Patient ID: Tracey Manning, female   DOB: [birth year], 54 y.o.   MRN: [id number]  Chief Complaint  Patient presents with  . Follow-up    Hospital follow up    HPI Tracey Manning is a 54 y.o. female.  H/O chronic pelvic pain.  Also had stopped having periods in June, then started an abnormally long period from 11-01-15 to 11-14-15 that was very heavy and painful. HPI  Past Medical History  Diagnosis Date  . Coronary artery disease   . Sciatica   . Hypertension   . Myocardial infarct ([place])   . Asthma   . Dyslipidemia   . Tobacco abuse   . Chest pain   . Collagen vascular disease ([place])   . Anxiety   . Depression   . GERD (gastroesophageal reflux disease)     Past Surgical History  Procedure Laterality Date  . Cesarean section    . Dilation and curettage, diagnostic / therapeutic    . Nm myocar perf wall motion  08/2009    persantine myoview - normal perfusion in all regions, perfusion defect in anterior region (breast attenuation), EF 52%, low risk scan  . Cardiac catheterization  10/09/2003    [facility] [place]) - LAD with 30% prox narrowing, 50% stenosis in mid-portion of PLA; RCA with 40% narrowing proximally ([name], [name])  . Cardiac catheterization  10/10/2007    70% stenosis in first septal perforator branch of LAD, 60-70% narrowing in mid LAD, 20% narrowing in mid AV groove Cfx with 80% diffuse narrowing in small distal marginal, total occlusion of mid RCA with L to R collaterals, 90% stenosis diffusely in prox branch of RCA followed by 70% stenosis in secondary curve & 80% stenosis in small marginal branch ([name])  . Coronary angioplasty with stent placement  10/31/2007    PCI of distal Cfx with 2.25x11mm Taxus Adam DES, 60% narrowing of mid LAD ([name])  . Cardiac catheterization  05/28/2008    normal L main, RCA with 100% prox lesion w/distal filling from LAD collaterals, LAD with 20% prox tubular lesion/ 40% mid LAD lesion/previous  stent patent ([name])  . Cardiac catheterization  09/15/2009    discrete 100% osital RCA lesion, 50% prox LAD lesion, non-obstructive disease in all coronaries ([name])  . Coronary angioplasty with stent placement  06/11/2011    PCI of prox-mid LAD with 3x34mm DES Resolute ([name])    Family History  Problem Relation Age of Onset  . Leukemia Mother   . Clotting disorder Father     blood clot  . Hypertension Sister   . Stroke Sister 68  . Bleeding Disorder Son     ITP "free bleeding disorder"    Social History Social History  Substance Use Topics  . Smoking status: Current Every Day Smoker -- 0.50 packs/day for 20 years    Types: Cigarettes  . Smokeless tobacco: Never Used  . Alcohol Use: 0.0 oz/week    0 Standard drinks or equivalent per week    Allergies  Allergen Reactions  . Other Anaphylaxis and Hives    Fresh strawberries (throat swelled)  . Dilaudid [Hydromorphone Hcl] Nausea And Vomiting  . Latex Swelling    No reaction with bandaids    Current Outpatient Prescriptions  Medication Sig Dispense Refill  . acyclovir (ZOVIRAX) 400 MG tablet Take 400 mg by mouth daily.    Marland Kitchen albuterol (PROVENTIL HFA;VENTOLIN HFA) 108 (90 BASE) MCG/ACT inhaler Inhale  2 puffs into the lungs every 6 (six) hours as needed for wheezing or shortness of breath.    Marland Kitchen amLODipine (NORVASC) 5 MG tablet Take 10 mg by mouth daily.    Marland Kitchen aspirin EC 325 MG tablet Take 325 mg by mouth daily.    Marland Kitchen atorvastatin (LIPITOR) 40 MG tablet Take 1 tablet (40 mg total) by mouth daily. 30 tablet 2  . clopidogrel (PLAVIX) 75 MG tablet Take 1 tablet (75 mg total) by mouth daily with breakfast. 30 tablet 2  . fluticasone (FLOVENT HFA) 220 MCG/ACT inhaler Inhale 1 puff into the lungs 2 (two) times daily as needed (wheezing).     . fluticasone-salmeterol (ADVAIR HFA) 115-21 MCG/ACT inhaler Inhale 2 puffs into the lungs 2 (two) times daily. 1 Inhaler 12  . ibuprofen (ADVIL,MOTRIN) 600 MG tablet Take  1 tablet (600 mg total) by mouth every 6 (six) hours as needed for headache or moderate pain (take with food/snack- DO NOT take on empty stomach- May substitute e OTC Ibuprofen tabs if prefers to not obtain by prescription). 30 tablet 0  . lisinopril-hydrochlorothiazide (PRINZIDE,ZESTORETIC) 20-12.5 MG per tablet Take 2 tablets by mouth daily.    . metroNIDAZOLE (FLAGYL) 500 MG tablet Take 1 tablet (500 mg total) by mouth every 12 (twelve) hours. 14 tablet 0  . nitroGLYCERIN (NITROSTAT) 0.4 MG SL tablet Place 1 tablet (0.4 mg total) under the tongue every 5 (five) minutes as needed for chest pain. 25 tablet 6  . sertraline (ZOLOFT) 100 MG tablet Take 100 mg by mouth daily.    Marland Kitchen zolpidem (AMBIEN) 10 MG tablet Take 10 mg by mouth at bedtime as needed for sleep.    Marland Kitchen doxycycline (VIBRAMYCIN) 50 MG capsule Take 1 capsule (50 mg total) by mouth 2 (two) times daily. 14 capsule 0  . metroNIDAZOLE (METROGEL VAGINAL) 0.75 % vaginal gel Place 1 Applicatorful vaginally 2 (two) times daily. 70 g 2   No current facility-administered medications for this visit.    Review of Systems Review of Systems Constitutional: negative for fatigue and weight loss Respiratory: negative for cough and wheezing Cardiovascular: negative for chest pain, fatigue and palpitations Gastrointestinal: negative for abdominal pain and change in bowel habits Genitourinary:negative Integument/breast: negative for nipple discharge Musculoskeletal:negative for myalgias Neurological: negative for gait problems and tremors Behavioral/Psych: negative for abusive relationship, depression Endocrine: negative for temperature intolerance     Blood pressure 171/103, pulse 69, temperature 97.6 F (36.4 C), weight 210 lb (95.255 kg), last menstrual period 10/24/2015.  Physical Exam Physical Exam:  Deferred  100% of 10 min visit spent on counseling and coordination of care.   Data Reviewed Ultrasound Labs  Assessment     Chronic  pelvic pain.  Ultrasound normal except small intramural fibroid. AUB     Plan    F/U in 2 weeks for Endometrial Biopsy.   No orders of the defined types were placed in this encounter.   Meds ordered this encounter  Medications  . metroNIDAZOLE (METROGEL VAGINAL) 0.75 % vaginal gel    Sig: Place 1 Applicatorful vaginally 2 (two) times daily.    Dispense:  70 g    Refill:  2  . doxycycline (VIBRAMYCIN) 50 MG capsule    Sig: Take 1 capsule (50 mg total) by mouth 2 (two) times daily.    Dispense:  14 capsule    Refill:  0

## 2015-11-20 NOTE — Patient Instructions (Signed)
Endometrial Biopsy Endometrial biopsy is a procedure in which a tissue sample is taken from inside the uterus. The tissue sample is then looked at under a microscope to see if the tissue is normal or abnormal. The endometrium is the lining of the uterus. This procedure helps determine where you are in your menstrual cycle and how hormone levels are affecting the lining of the uterus. This procedure may also be used to evaluate uterine bleeding or to diagnose endometrial cancer, tuberculosis, polyps, or inflammatory conditions.  LET YOUR HEALTH CARE PROVIDER KNOW ABOUT:  Any allergies you have.  All medicines you are taking, including vitamins, herbs, eye drops, creams, and over-the-counter medicines.  Previous problems you or members of your family have had with the use of anesthetics.  Any blood disorders you have.  Previous surgeries you have had.  Medical conditions you have.  Possibility of pregnancy. RISKS AND COMPLICATIONS Generally, this is a safe procedure. However, as with any procedure, complications can occur. Possible complications include:  Bleeding.  Pelvic infection.  Puncture of the uterine wall with the biopsy device (rare). BEFORE THE PROCEDURE   Keep a record of your menstrual cycles as directed by your health care provider. You may need to schedule your procedure for a specific time in your cycle.  You may want to bring a sanitary pad to wear home after the procedure.  Arrange for someone to drive you home after the procedure if you will be given a medicine to help you relax (sedative). PROCEDURE   You may be given a sedative to relax you.  You will lie on an exam table with your feet and legs supported as in a pelvic exam.  Your health care provider will insert an instrument (speculum) into your vagina to see your cervix.  Your cervix will be cleansed with an antiseptic solution. A medicine (local anesthetic) will be used to numb the cervix.  A forceps  instrument (tenaculum) will be used to hold your cervix steady for the biopsy.  A thin, rodlike instrument (uterine sound) will be inserted through your cervix to determine the length of your uterus and the location where the biopsy sample will be removed.  A thin, flexible tube (catheter) will be inserted through your cervix and into the uterus. The catheter is used to collect the biopsy sample from your endometrial tissue.  The catheter and speculum will then be removed, and the tissue sample will be sent to a lab for examination. AFTER THE PROCEDURE  You will rest in a recovery area until you are ready to go home.  You may have mild cramping and a small amount of vaginal bleeding for a few days after the procedure. This is normal.  Make sure you find out how to get your test results.   This information is not intended to replace advice given to you by your health care provider. Make sure you discuss any questions you have with your health care provider.   Document Released: 03/12/2005 Document Revised: 07/12/2013 Document Reviewed: 04/26/2013 Elsevier Interactive Patient Education 2016 [employer]  

## 2015-12-24 ENCOUNTER — Emergency Department (HOSPITAL_COMMUNITY): Payer: Medicaid Other

## 2015-12-24 ENCOUNTER — Telehealth: Payer: Self-pay | Admitting: *Deleted

## 2015-12-24 ENCOUNTER — Encounter (HOSPITAL_COMMUNITY): Payer: Self-pay | Admitting: Cardiology

## 2015-12-24 ENCOUNTER — Emergency Department (HOSPITAL_COMMUNITY)
Admission: EM | Admit: 2015-12-24 | Discharge: 2015-12-24 | Disposition: A | Discharge status: Home / Self Care | Payer: Medicaid Other | Attending: Emergency Medicine | Admitting: Emergency Medicine

## 2015-12-24 DIAGNOSIS — Z7902 Long term (current) use of antithrombotics/antiplatelets: Secondary | ICD-10-CM | POA: Insufficient documentation

## 2015-12-24 DIAGNOSIS — F419 Anxiety disorder, unspecified: Secondary | ICD-10-CM | POA: Diagnosis not present

## 2015-12-24 DIAGNOSIS — Z3202 Encounter for pregnancy test, result negative: Secondary | ICD-10-CM | POA: Insufficient documentation

## 2015-12-24 DIAGNOSIS — N939 Abnormal uterine and vaginal bleeding, unspecified: Secondary | ICD-10-CM

## 2015-12-24 DIAGNOSIS — J45909 Unspecified asthma, uncomplicated: Secondary | ICD-10-CM | POA: Insufficient documentation

## 2015-12-24 DIAGNOSIS — Z91040 Latex allergy status: Secondary | ICD-10-CM | POA: Diagnosis not present

## 2015-12-24 DIAGNOSIS — F329 Major depressive disorder, single episode, unspecified: Secondary | ICD-10-CM | POA: Diagnosis not present

## 2015-12-24 DIAGNOSIS — F17210 Nicotine dependence, cigarettes, uncomplicated: Secondary | ICD-10-CM | POA: Insufficient documentation

## 2015-12-24 DIAGNOSIS — I10 Essential (primary) hypertension: Secondary | ICD-10-CM | POA: Diagnosis not present

## 2015-12-24 DIAGNOSIS — Z79899 Other long term (current) drug therapy: Secondary | ICD-10-CM | POA: Diagnosis not present

## 2015-12-24 DIAGNOSIS — I2510 Atherosclerotic heart disease of native coronary artery without angina pectoris: Secondary | ICD-10-CM | POA: Diagnosis not present

## 2015-12-24 DIAGNOSIS — Z7982 Long term (current) use of aspirin: Secondary | ICD-10-CM | POA: Insufficient documentation

## 2015-12-24 DIAGNOSIS — I252 Old myocardial infarction: Secondary | ICD-10-CM | POA: Diagnosis not present

## 2015-12-24 DIAGNOSIS — K219 Gastro-esophageal reflux disease without esophagitis: Secondary | ICD-10-CM | POA: Diagnosis not present

## 2015-12-24 DIAGNOSIS — R102 Pelvic and perineal pain: Secondary | ICD-10-CM

## 2015-12-24 LAB — COMPREHENSIVE METABOLIC PANEL
ALT: 27 U/L (ref 14–54)
ANION GAP: 10 (ref 5–15)
AST: 29 U/L (ref 15–41)
Albumin: 3.9 g/dL (ref 3.5–5.0)
Alkaline Phosphatase: 69 U/L (ref 38–126)
BUN: 12 mg/dL (ref 6–20)
CHLORIDE: 104 mmol/L (ref 101–111)
CO2: 27 mmol/L (ref 22–32)
Calcium: 9.3 mg/dL (ref 8.9–10.3)
Creatinine, Ser: 1.13 mg/dL — ABNORMAL HIGH (ref 0.44–1.00)
GFR calc Af Amer: >60 mL/min (ref >60)
GFR, EST NON AFRICAN AMERICAN: 54 mL/min — AB (ref >60)
Glucose, Bld: 99 mg/dL (ref 65–99)
POTASSIUM: 3.7 mmol/L (ref 3.5–5.1)
Sodium: 141 mmol/L (ref 135–145)
Total Bilirubin: 0.8 mg/dL (ref 0.3–1.2)
Total Protein: 6.9 g/dL (ref 6.5–8.1)

## 2015-12-24 LAB — CBC
HEMATOCRIT: 40.1 % (ref 36.0–46.0)
HEMOGLOBIN: 13.5 g/dL (ref 12.0–15.0)
MCH: 28.9 pg (ref 26.0–34.0)
MCHC: 33.7 g/dL (ref 30.0–36.0)
MCV: 85.9 fL (ref 78.0–100.0)
Platelets: 241 10*3/uL (ref 150–400)
RBC: 4.67 MIL/uL (ref 3.87–5.11)
RDW: 16 % — AB (ref 11.5–15.5)
WBC: 6.6 10*3/uL (ref 4.0–10.5)

## 2015-12-24 LAB — WET PREP, GENITAL
CLUE CELLS WET PREP: NONE SEEN
SPERM: NONE SEEN
TRICH WET PREP: NONE SEEN
YEAST WET PREP: NONE SEEN

## 2015-12-24 LAB — I-STAT BETA HCG BLOOD, ED (MC, WL, AP ONLY): I-stat hCG, quantitative: <5 m[IU]/mL (ref <5)

## 2015-12-24 LAB — LIPASE, BLOOD: LIPASE: 26 U/L (ref 11–51)

## 2015-12-24 MED ORDER — MEDROXYPROGESTERONE ACETATE 400 MG/ML IM SUSP
400.0000 mg | Freq: Once | INTRAMUSCULAR | Status: AC
  Administered 2015-12-24: 400 mg via INTRAMUSCULAR
  Filled 2015-12-24: qty 1

## 2015-12-24 MED ORDER — OXYCODONE-ACETAMINOPHEN 5-325 MG PO TABS
1.0000 | ORAL_TABLET | Freq: Once | ORAL | Status: AC
  Administered 2015-12-24: 1 via ORAL
  Filled 2015-12-24 (×2): qty 1

## 2015-12-24 MED ORDER — KETOROLAC TROMETHAMINE 60 MG/2ML IM SOLN
60.0000 mg | Freq: Once | INTRAMUSCULAR | Status: AC
  Administered 2015-12-24: 60 mg via INTRAMUSCULAR
  Filled 2015-12-24: qty 2

## 2015-12-24 MED ORDER — PROMETHAZINE HCL 25 MG/ML IJ SOLN
25.0000 mg | Freq: Once | INTRAMUSCULAR | Status: AC
  Administered 2015-12-24: 25 mg via INTRAMUSCULAR
  Filled 2015-12-24: qty 1

## 2015-12-24 MED ORDER — OXYCODONE-ACETAMINOPHEN 5-325 MG PO TABS: 1.0000 | ORAL_TABLET | Freq: Four times a day (QID) | ORAL | Status: DC | PRN

## 2015-12-24 NOTE — ED Notes (Signed)
Discharge vitals complete.

## 2015-12-24 NOTE — Telephone Encounter (Signed)
Patient ask for call back- no reason given 12:15 Call to patient- patient reports she has been bleeding so haevily that she is soaking pads. She is at the hospital now. She states she is bleeding like water is running. Told her that is where she needs to be and I will let Dr [name] know.

## 2015-12-24 NOTE — ED Notes (Signed)
Patient refused 5/325 Percocet.   "Those won't help me, but if she will give me 10's, I can make it".   Patient then changed mind, "You know, nevermind on the percocet 10's, I will need morphine".

## 2015-12-24 NOTE — Discharge Instructions (Signed)
Dr [name] wants to see you in the office for follow up. Call to schedule a time. Do not drive while taking the narcotic as it will make you sleepy.  Abnormal Uterine Bleeding Abnormal uterine bleeding can affect women at various stages in life, including teenagers, women in their reproductive years, pregnant women, and women who have reached menopause. Several kinds of uterine bleeding are considered abnormal, including:  Bleeding or spotting between periods.   Bleeding after sexual intercourse.   Bleeding that is heavier or more than normal.   Periods that last longer than usual.  Bleeding after menopause.  Many cases of abnormal uterine bleeding are minor and simple to treat, while others are more serious. Any type of abnormal bleeding should be evaluated by your health care provider. Treatment will depend on the cause of the bleeding. HOME CARE INSTRUCTIONS Monitor your condition for any changes. The following actions may help to alleviate any discomfort you are experiencing:  Avoid the use of tampons and douches as directed by your health care provider.  Change your pads frequently. You should get regular pelvic exams and Pap tests. Keep all follow-up appointments for diagnostic tests as directed by your health care provider.  SEEK MEDICAL CARE IF:   Your bleeding lasts more than 1 week.   You feel dizzy at times.  SEEK IMMEDIATE MEDICAL CARE IF:   You pass out.   You are changing pads every 15 to 30 minutes.   You have abdominal pain.  You have a fever.   You become sweaty or weak.   You are passing large blood clots from the vagina.   You start to feel nauseous and vomit. MAKE SURE YOU:   Understand these instructions.  Will watch your condition.  Will get help right away if you are not doing well or get worse.   This information is not intended to replace advice given to you by your health care provider. Make sure you discuss any questions you have  with your health care provider.   Document Released: 11/09/2005 Document Revised: 11/14/2013 Document Reviewed: 06/08/2013 Elsevier Interactive Patient Education [employer].

## 2015-12-24 NOTE — ED Notes (Signed)
Pt reports abd pain and vaginal bleeding over the past couple of months. States she saw her OB/GYN but has not followed up. Reports intermittent bleeding since the first of December.

## 2015-12-24 NOTE — ED Provider Notes (Signed)
CSN: [id number]     Arrival date & time 12/24/15  1213 History  By signing my name below, I, Tracey Manning, attest that this documentation has been prepared under the direction and in the presence of Tracey Baller, NP. Electronically Signed: [name], ED Scribe. 12/24/2015. 1:40 PM.   Chief Complaint  Patient presents with  . Abdominal Pain  . Vaginal Bleeding   Patient is a 55 y.o. female presenting with abdominal pain and vaginal bleeding. The history is provided by the patient. No language interpreter was used.  Abdominal Pain Pain location:  Suprapubic Pain quality: cramping   Pain radiates to:  Does not radiate Pain severity:  Moderate Onset quality:  Gradual Duration: months. Timing:  Intermittent Progression:  Worsening Chronicity:  Recurrent Associated symptoms: nausea and vaginal bleeding   Associated symptoms: no chills, no dysuria and no fever   Vaginal Bleeding Severity:  Moderate Onset quality:  Gradual Duration: months. Timing:  Intermittent Progression:  Worsening Chronicity:  Recurrent Menstrual history:  Postmenopausal Number of pads used:  4-6 per hour Associated symptoms: abdominal pain and nausea   Associated symptoms: no dysuria and no fever   Risk factors: no new sexual partner      HPI Comments: Tracey Manning is a 55 y.o. female who presents to the Emergency Department complaining of gradual onset, intermittent, moderate, cramping, abdominal pain and vaginal bleeding.x 2 months. Pt reports that in June 2016 (7 months ago) she began going through menopause. In December 2016 (2 months ago) she began having prolonged vaginal bleeding. Pt reports persistent vaginal bleeding from 11/01/2015 - 11/14/2015. She was seen by her OBGYN, Dr. Jodi Manning, at that time and had a CT done that showed fibroids. Pt has hx of fibroids that were removed in the past. Her last ultrasound was in May 2016. Dr. Jodi Manning told pt that he wanted to test for endometriosis but she has not  had the test done yet. Pt then had vaginal bleeding from 11/24/2015-12/06/2015, 12/08/2015-12/13/2015, and again from 12/15/2015-present. Pt reports that the most recent vaginal bleeding is the worst that it's been in the past 2 months. She mentions using 4-6 pads in 1 hour. Pt was told by her OBGYN to go to [name] if her pain and vaginal bleeding gets worse. She states that she was seen 1 week ago at [name] but was transferred here due to having chest pain during her visit. She also complains of nausea from the pain. Pt has had 1 sexual partner for the past 10 years. She has hx of STDS (trichomoniasis and bacterial vaginosis) that was treated. Pt reports that she attempted to have intercourse 4 days ago but was unable to do so due to the pain being so unbearable. She does state she and her partner believe she could be having a miscarriage, although she took an at home pregnancy test recently which was negative. Denies fever, chills, vomiting, frequency, dysuria, or any other associated symptoms. [id number].   OBGYN - Dr. Jodi Manning   Past Medical History  Diagnosis Date  . Coronary artery disease   . Sciatica   . Hypertension   . Myocardial infarct ([place])   . Asthma   . Dyslipidemia   . Tobacco abuse   . Chest pain   . Collagen vascular disease ([place])   . Anxiety   . Depression   . GERD (gastroesophageal reflux disease)    Past Surgical History  Procedure Laterality Date  . Cesarean section    . Dilation and curettage,  diagnostic / therapeutic    . Nm myocar perf wall motion  08/2009    persantine myoview - normal perfusion in all regions, perfusion defect in anterior region (breast attenuation), EF 52%, low risk scan  . Cardiac catheterization  10/09/2003    [place], [place]) - LAD with 30% prox narrowing, 50% stenosis in mid-portion of PLA; RCA with 40% narrowing proximally ([name], [name])  . Cardiac catheterization  10/10/2007    70% stenosis in first septal  perforator branch of LAD, 60-70% narrowing in mid LAD, 20% narrowing in mid AV groove Cfx with 80% diffuse narrowing in small distal marginal, total occlusion of mid RCA with L to R collaterals, 90% stenosis diffusely in prox branch of RCA followed by 70% stenosis in secondary curve & 80% stenosis in small marginal branch ([name])  . Coronary angioplasty with stent placement  10/31/2007    PCI of distal Cfx with 2.25x63mm Taxus Adam DES, 60% narrowing of mid LAD ([name])  . Cardiac catheterization  05/28/2008    normal L main, RCA with 100% prox lesion w/distal filling from LAD collaterals, LAD with 20% prox tubular lesion/ 40% mid LAD lesion/previous stent patent ([name])  . Cardiac catheterization  09/15/2009    discrete 100% osital RCA lesion, 50% prox LAD lesion, non-obstructive disease in all coronaries ([name])  . Coronary angioplasty with stent placement  06/11/2011    PCI of prox-mid LAD with 3x72mm DES Resolute ([name])   Family History  Problem Relation Age of Onset  . Leukemia Mother   . Clotting disorder Father     blood clot  . Hypertension Sister   . Stroke Sister 55  . Bleeding Disorder Son     ITP "free bleeding disorder"   Social History  Substance Use Topics  . Smoking status: Current Every Day Smoker -- 0.50 packs/day for 20 years    Types: Cigarettes  . Smokeless tobacco: Never Used  . Alcohol Use: 0.0 oz/week    0 Standard drinks or equivalent per week   OB History    No data available     Review of Systems  Constitutional: Negative for fever and chills.  Gastrointestinal: Positive for nausea and abdominal pain.  Genitourinary: Positive for vaginal bleeding. Negative for dysuria and frequency.  All other systems reviewed and are negative.   Allergies  Other; Dilaudid; and Latex  Home Medications   Prior to Admission medications   Medication Sig Start Date End Date Taking? Authorizing Provider  acyclovir (ZOVIRAX) 400 MG  tablet Take 400 mg by mouth daily.    Historical Provider, MD  albuterol (PROVENTIL HFA;VENTOLIN HFA) 108 (90 BASE) MCG/ACT inhaler Inhale 2 puffs into the lungs every 6 (six) hours as needed for wheezing or shortness of breath.    Historical Provider, MD  amLODipine (NORVASC) 5 MG tablet Take 10 mg by mouth daily.    Historical Provider, MD  aspirin EC 325 MG tablet Take 325 mg by mouth daily.    Historical Provider, MD  atorvastatin (LIPITOR) 40 MG tablet Take 1 tablet (40 mg total) by mouth daily. 01/04/14   [name], MD  clopidogrel (PLAVIX) 75 MG tablet Take 1 tablet (75 mg total) by mouth daily with breakfast. 01/04/14   [name], MD  doxycycline (VIBRAMYCIN) 50 MG capsule Take 1 capsule (50 mg total) by mouth 2 (two) times daily. 11/20/15   [name], MD  fluticasone (  FLOVENT HFA) 220 MCG/ACT inhaler Inhale 1 puff into the lungs 2 (two) times daily as needed (wheezing).     Historical Provider, MD  fluticasone-salmeterol (ADVAIR HFA) 115-21 MCG/ACT inhaler Inhale 2 puffs into the lungs 2 (two) times daily. 01/04/14   [name], MD  ibuprofen (ADVIL,MOTRIN) 600 MG tablet Take 1 tablet (600 mg total) by mouth every 6 (six) hours as needed for headache or moderate pain (take with food/snack- DO NOT take on empty stomach- May substitute e OTC Ibuprofen tabs if prefers to not obtain by prescription). 11/14/15   Samella Parr, NP  lisinopril-hydrochlorothiazide (PRINZIDE,ZESTORETIC) 20-12.5 MG per tablet Take 2 tablets by mouth daily.    Historical Provider, MD  metroNIDAZOLE (FLAGYL) 500 MG tablet Take 1 tablet (500 mg total) by mouth every 12 (twelve) hours. 11/14/15   Samella Parr, NP  metroNIDAZOLE (METROGEL VAGINAL) 0.75 % vaginal gel Place 1 Applicatorful vaginally 2 (two) times daily. 11/20/15   [name], MD  nitroGLYCERIN (NITROSTAT) 0.4 MG SL tablet Place 1 tablet (0.4 mg total) under the tongue every 5 (five) minutes as needed for chest pain. 06/05/15    [name], MD  sertraline (ZOLOFT) 100 MG tablet Take 100 mg by mouth daily.    Historical Provider, MD  zolpidem (AMBIEN) 10 MG tablet Take 10 mg by mouth at bedtime as needed for sleep.    Historical Provider, MD   BP 166/96 mmHg  Pulse 87  Temp(Src) 98.5 F (36.9 C) (Oral)  Resp 18  SpO2 99%  LMP 10/24/2015   Physical Exam  Constitutional: She is oriented to person, place, and time. She appears well-developed and well-nourished. No distress.  HENT:  Head: Normocephalic and atraumatic.  Eyes: Conjunctivae and EOM are normal.  Neck: Neck supple. No tracheal deviation present.  Cardiovascular: Normal rate, regular rhythm and normal heart sounds.   Pulmonary/Chest: Effort normal and breath sounds normal. No respiratory distress. She has no wheezes. She has no rales.  Abdominal: Soft. Bowel sounds are normal. There is tenderness. There is no rebound, no guarding and no CVA tenderness.  Tenderness in the lower abdomen  Genitourinary:  External genitalia without lesions,moderate blood vaginal vault, positive CMT, bilateral adnexal tenderness, unable to palpate uterus due to patient habitus.   Musculoskeletal: Normal range of motion.  Neurological: She is alert and oriented to person, place, and time.  Skin: Skin is warm and dry.  Psychiatric: She has a normal mood and affect. Her behavior is normal.  Nursing note and vitals reviewed.   ED Course  Procedures (including critical care time)  DIAGNOSTIC STUDIES: Oxygen Saturation is 99% on RA, normal by my interpretation.    COORDINATION OF CARE: 1:36 PM-Discussed treatment plan which includes US Pelvis, Lipase, CMP, CBC, UA, and beta hCG with pt at bedside and pt agreed to plan.   Labs Review Results for orders placed or performed during the hospital encounter of 12/24/15 (from the past 24 hour(s))  Lipase, blood     Status: None   Collection Time: 12/24/15 12:29 PM  Result Value Ref Range   Lipase 26 11 - 51 U/L   Comprehensive metabolic panel     Status: Abnormal   Collection Time: 12/24/15 12:29 PM  Result Value Ref Range   Sodium 141 135 - 145 mmol/L   Potassium 3.7 3.5 - 5.1 mmol/L   Chloride 104 101 - 111 mmol/L   CO2 27 22 - 32 mmol/L   Glucose, Bld 99 65 -  99 mg/dL   BUN 12 6 - 20 mg/dL   Creatinine, Ser 1.13 (H) 0.44 - 1.00 mg/dL   Calcium 9.3 8.9 - 10.3 mg/dL   Total Protein 6.9 6.5 - 8.1 g/dL   Albumin 3.9 3.5 - 5.0 g/dL   AST 29 15 - 41 U/L   ALT 27 14 - 54 U/L   Alkaline Phosphatase 69 38 - 126 U/L   Total Bilirubin 0.8 0.3 - 1.2 mg/dL   GFR calc non Af Amer 54 (L) >60 mL/min   GFR calc Af Amer >60 >60 mL/min   Anion gap 10 5 - 15  CBC     Status: Abnormal   Collection Time: 12/24/15 12:29 PM  Result Value Ref Range   WBC 6.6 4.0 - 10.5 K/uL   RBC 4.67 3.87 - 5.11 MIL/uL   Hemoglobin 13.5 12.0 - 15.0 g/dL   HCT 40.1 36.0 - 46.0 %   MCV 85.9 78.0 - 100.0 fL   MCH 28.9 26.0 - 34.0 pg   MCHC 33.7 30.0 - 36.0 g/dL   RDW 16.0 (H) 11.5 - 15.5 %   Platelets 241 150 - 400 K/uL  I-Stat beta hCG blood, ED ([facility], [facility], [facility] only)     Status: None   Collection Time: 12/24/15 12:46 PM  Result Value Ref Range   I-stat hCG, quantitative <5.0 <5 mIU/mL   Comment 3          Wet prep, genital     Status: Abnormal   Collection Time: 12/24/15  3:10 PM  Result Value Ref Range   Yeast Wet Prep HPF POC NONE SEEN NONE SEEN   Trich, Wet Prep NONE SEEN NONE SEEN   Clue Cells Wet Prep HPF POC NONE SEEN NONE SEEN   WBC, Wet Prep HPF POC FEW (A) NONE SEEN   Sperm NONE SEEN     Imaging Review US Transvaginal Non-ob  12/24/2015  CLINICAL DATA:  Dysfunctional uterine bleeding and pelvic pain for 1 month. EXAM: TRANSABDOMINAL AND TRANSVAGINAL ULTRASOUND OF PELVIS TECHNIQUE: Both transabdominal and transvaginal ultrasound examinations of the pelvis were performed. Transabdominal technique was performed for global imaging of the pelvis including uterus, ovaries, adnexal regions, and pelvic cul-de-sac. It  was necessary to proceed with endovaginal exam following the transabdominal exam to visualize the ovaries and endometrium. COMPARISON:  CT scan 11/14/2015. FINDINGS: Uterus Measurements: 8.6 x 5.1 x 5.0 cm. 2.6 x 2.7 x 2.9 cm posterior fundal fibroid. Endometrium Thickness: 15 mm.  No focal abnormality visualized. Right ovary Not visualized.  No adnexal mass. Left ovary Not visualized.  No adnexal mass. Other findings No abnormal free fluid. IMPRESSION: 1. Small fundal fibroid. 2. The endometrium measures 15 mm. This is within normal limits for a non postmenopausal patient. 3. The ovaries could not be identified but no adnexal masses were seen. Electronically Signed   By: Marijo Sanes M.D.   On: 12/24/2015 14:34   US Pelvis Complete  12/24/2015  CLINICAL DATA:  Dysfunctional uterine bleeding and pelvic pain for 1 month. EXAM: TRANSABDOMINAL AND TRANSVAGINAL ULTRASOUND OF PELVIS TECHNIQUE: Both transabdominal and transvaginal ultrasound examinations of the pelvis were performed. Transabdominal technique was performed for global imaging of the pelvis including uterus, ovaries, adnexal regions, and pelvic cul-de-sac. It was necessary to proceed with endovaginal exam following the transabdominal exam to visualize the ovaries and endometrium. COMPARISON:  CT scan 11/14/2015. FINDINGS: Uterus Measurements: 8.6 x 5.1 x 5.0 cm. 2.6 x 2.7 x 2.9 cm posterior fundal fibroid. Endometrium  Thickness: 15 mm.  No focal abnormality visualized. Right ovary Not visualized.  No adnexal mass. Left ovary Not visualized.  No adnexal mass. Other findings No abnormal free fluid. IMPRESSION: 1. Small fundal fibroid. 2. The endometrium measures 15 mm. This is within normal limits for a non postmenopausal patient. 3. The ovaries could not be identified but no adnexal masses were seen. Electronically Signed   By: Marijo Sanes M.D.   On: 12/24/2015 14:34   I have personally reviewed and evaluated these images and lab results as part of my  medical decision-making.  Consult with Dr. Jodi Manning, patient's GYN, and he request Depo Provera 400 mg IM to help stop the bleeding and f/u in the office in 2 weeks or sooner if needed. He request she has a few narcotic pain pills until follow up.   MDM  55 y.o. female with pelvic pain and postmenopausal bleeding stable for d/c with normal CBC and pain improved with medication. Patient to follow up with Dr. Jodi Manning in 2 weeks or sooner for problems. Will treat for pain. Patient without pain at time of d/c.    Final diagnoses:  Pelvic pain in female    I personally performed the services described in this documentation, which was scribed in my presence. The recorded information has been reviewed and is accurate.    [address] [place], NP 12/24/15 [place], MD 12/25/15 [phone]

## 2015-12-25 LAB — GC/CHLAMYDIA PROBE AMP (~~LOC~~) NOT AT ARMC
Chlamydia: NEGATIVE
Neisseria Gonorrhea: NEGATIVE

## 2015-12-31 ENCOUNTER — Other Ambulatory Visit: Payer: Self-pay | Admitting: *Deleted

## 2015-12-31 ENCOUNTER — Ambulatory Visit (INDEPENDENT_AMBULATORY_CARE_PROVIDER_SITE_OTHER): Payer: Medicaid Other | Admitting: Obstetrics

## 2015-12-31 ENCOUNTER — Encounter: Payer: Self-pay | Admitting: Obstetrics

## 2015-12-31 VITALS — BP 149/92 | HR 73 | Temp 97.6°F | Wt 208.0 lb

## 2015-12-31 DIAGNOSIS — N946 Dysmenorrhea, unspecified: Secondary | ICD-10-CM | POA: Diagnosis not present

## 2015-12-31 DIAGNOSIS — N939 Abnormal uterine and vaginal bleeding, unspecified: Secondary | ICD-10-CM

## 2015-12-31 MED ORDER — MEDROXYPROGESTERONE ACETATE 10 MG PO TABS: 10.0000 mg | ORAL_TABLET | Freq: Every day | ORAL | Status: DC

## 2015-12-31 MED ORDER — IBUPROFEN 800 MG PO TABS: 800.0000 mg | ORAL_TABLET | Freq: Three times a day (TID) | ORAL | Status: DC | PRN

## 2015-12-31 NOTE — Progress Notes (Addendum)
Patient ID: Tracey Manning, female   DOB: Feb 14, 1961, 56 y.o.   MRN: [id number]  Chief Complaint  Patient presents with  . Follow-up    Hospital follow up. Patient states her vaginal bleeding has lightened up.     HPI Tracey Manning is a 55 y.o. female.  H/O AUB.  Seen at [facility] and given high dose Depo Provera for AUB.  Present today for F/U.  States that the vaginal bleeding has almost stopped.  Less cramping.  HPI  Past Medical History  Diagnosis Date  . Coronary artery disease   . Sciatica   . Hypertension   . Myocardial infarct ([place])   . Asthma   . Dyslipidemia   . Tobacco abuse   . Chest pain   . Collagen vascular disease ([place])   . Anxiety   . Depression   . GERD (gastroesophageal reflux disease)     Past Surgical History  Procedure Laterality Date  . Cesarean section    . Dilation and curettage, diagnostic / therapeutic    . Nm myocar perf wall motion  08/2009    persantine myoview - normal perfusion in all regions, perfusion defect in anterior region (breast attenuation), EF 52%, low risk scan  . Cardiac catheterization  10/09/2003    [place], [place]) - LAD with 30% prox narrowing, 50% stenosis in mid-portion of PLA; RCA with 40% narrowing proximally ([name], [name])  . Cardiac catheterization  10/10/2007    70% stenosis in first septal perforator branch of LAD, 60-70% narrowing in mid LAD, 20% narrowing in mid AV groove Cfx with 80% diffuse narrowing in small distal marginal, total occlusion of mid RCA with L to R collaterals, 90% stenosis diffusely in prox branch of RCA followed by 70% stenosis in secondary curve & 80% stenosis in small marginal branch ([name])  . Coronary angioplasty with stent placement  10/31/2007    PCI of distal Cfx with 2.25x15mm Taxus Adam DES, 60% narrowing of mid LAD ([name])  . Cardiac catheterization  05/28/2008    normal L main, RCA with 100% prox lesion w/distal filling from LAD collaterals, LAD  with 20% prox tubular lesion/ 40% mid LAD lesion/previous stent patent ([name])  . Cardiac catheterization  09/15/2009    discrete 100% osital RCA lesion, 50% prox LAD lesion, non-obstructive disease in all coronaries ([name])  . Coronary angioplasty with stent placement  06/11/2011    PCI of prox-mid LAD with 3x17mm DES Resolute ([name])    Family History  Problem Relation Age of Onset  . Leukemia Mother   . Clotting disorder Father     blood clot  . Hypertension Sister   . Diabetes Sister   . Stroke Sister 78  . Bleeding Disorder Son     ITP "free bleeding disorder"    Social History Social History  Substance Use Topics  . Smoking status: Current Every Day Smoker -- 0.50 packs/day for 20 years    Types: Cigarettes  . Smokeless tobacco: Never Used  . Alcohol Use: 0.0 oz/week    0 Standard drinks or equivalent per week     Comment: Occassionally     Allergies  Allergen Reactions  . Other Anaphylaxis and Hives    Fresh strawberries (throat swelled)  . Dilaudid [Hydromorphone Hcl] Nausea And Vomiting  . Latex Swelling    No reaction with bandaids    Current Outpatient Prescriptions  Medication Sig Dispense Refill  .  acyclovir (ZOVIRAX) 400 MG tablet Take 400 mg by mouth daily.    Marland Kitchen albuterol (PROVENTIL HFA;VENTOLIN HFA) 108 (90 BASE) MCG/ACT inhaler Inhale 2 puffs into the lungs every 6 (six) hours as needed for wheezing or shortness of breath.    Marland Kitchen amLODipine (NORVASC) 5 MG tablet Take 10 mg by mouth daily.    Marland Kitchen aspirin EC 325 MG tablet Take 325 mg by mouth daily.    Marland Kitchen atorvastatin (LIPITOR) 40 MG tablet Take 1 tablet (40 mg total) by mouth daily. 30 tablet 2  . clopidogrel (PLAVIX) 75 MG tablet Take 1 tablet (75 mg total) by mouth daily with breakfast. 30 tablet 2  . doxycycline (VIBRAMYCIN) 50 MG capsule Take 1 capsule (50 mg total) by mouth 2 (two) times daily. 14 capsule 0  . fluticasone (FLOVENT HFA) 220 MCG/ACT inhaler Inhale 1 puff into the  lungs 2 (two) times daily as needed (wheezing).     . fluticasone-salmeterol (ADVAIR HFA) 115-21 MCG/ACT inhaler Inhale 2 puffs into the lungs 2 (two) times daily. 1 Inhaler 12  . ibuprofen (ADVIL,MOTRIN) 800 MG tablet Take 1 tablet (800 mg total) by mouth every 8 (eight) hours as needed for mild pain or moderate pain. 30 tablet 5  . ibuprofen (ADVIL,MOTRIN) 800 MG tablet Take 1 tablet (800 mg total) by mouth every 8 (eight) hours as needed. 30 tablet 5  . lisinopril-hydrochlorothiazide (PRINZIDE,ZESTORETIC) 20-12.5 MG per tablet Take 2 tablets by mouth daily.    . medroxyPROGESTERone (PROVERA) 10 MG tablet Take 1 tablet (10 mg total) by mouth daily. 30 tablet 0  . metroNIDAZOLE (FLAGYL) 500 MG tablet Take 1 tablet (500 mg total) by mouth every 12 (twelve) hours. 14 tablet 0  . metroNIDAZOLE (METROGEL VAGINAL) 0.75 % vaginal gel Place 1 Applicatorful vaginally 2 (two) times daily. 70 g 2  . nitroGLYCERIN (NITROSTAT) 0.4 MG SL tablet Place 1 tablet (0.4 mg total) under the tongue every 5 (five) minutes as needed for chest pain. 25 tablet 6  . oxyCODONE-acetaminophen (ROXICET) 5-325 MG tablet Take 1-2 tablets by mouth every 6 (six) hours as needed for severe pain. 15 tablet 0  . sertraline (ZOLOFT) 100 MG tablet Take 100 mg by mouth daily.    Marland Kitchen zolpidem (AMBIEN) 10 MG tablet Take 10 mg by mouth at bedtime as needed for sleep.     No current facility-administered medications for this visit.    Review of Systems Review of Systems Constitutional: negative for fatigue and weight loss Respiratory: negative for cough and wheezing Cardiovascular: negative for chest pain, fatigue and palpitations Gastrointestinal: negative for abdominal pain and change in bowel habits Genitourinary:  Positive for heavy vaginal bleeding and cramping with prolonged periods Integument/breast: negative for nipple discharge Musculoskeletal:negative for myalgias Neurological: negative for gait problems and  tremors Behavioral/Psych: negative for abusive relationship.  Positive for depression Endocrine: negative for temperature intolerance     Blood pressure 149/92, pulse 73, temperature 97.6 F (36.4 C), weight 208 lb (94.348 kg), last menstrual period 10/24/2015.  Physical Exam Physical Exam:  Deferred  100% of 15 min visit spent on counseling and coordination of care.   Data Reviewed Labs Ultrasound  Assessment     AUB - Hormonal Imbalance.      Plan    Management options discussed.  All questions answered. Wants Endometrial Ablation.   Educational material dispensed Will schedule Ablation in 3-4 weeks. Provera 10 mg po daily for endometrial preparation pre-op.  No orders of the defined types were placed in  this encounter.   Meds ordered this encounter  Medications  . DISCONTD: ibuprofen (ADVIL,MOTRIN) 800 MG tablet    Sig: Take 1 tablet (800 mg total) by mouth every 8 (eight) hours as needed.    Dispense:  30 tablet    Refill:  5  . medroxyPROGESTERone (PROVERA) 10 MG tablet    Sig: Take 1 tablet (10 mg total) by mouth daily.    Dispense:  30 tablet    Refill:  0  . ibuprofen (ADVIL,MOTRIN) 800 MG tablet    Sig: Take 1 tablet (800 mg total) by mouth every 8 (eight) hours as needed for mild pain or moderate pain.    Dispense:  30 tablet    Refill:  5  . ibuprofen (ADVIL,MOTRIN) 800 MG tablet    Sig: Take 1 tablet (800 mg total) by mouth every 8 (eight) hours as needed.    Dispense:  30 tablet    Refill:  5

## 2016-01-15 ENCOUNTER — Encounter (HOSPITAL_COMMUNITY)
Admission: RE | Admit: 2016-01-15 | Discharge: 2016-01-15 | Disposition: A | Discharge status: Home / Self Care | Payer: Medicaid Other | Source: Ambulatory Visit | Attending: Obstetrics | Admitting: Obstetrics

## 2016-01-15 ENCOUNTER — Encounter (HOSPITAL_COMMUNITY): Payer: Self-pay

## 2016-01-15 ENCOUNTER — Telehealth: Payer: Self-pay | Admitting: Cardiovascular Disease

## 2016-01-15 DIAGNOSIS — Z01812 Encounter for preprocedural laboratory examination: Secondary | ICD-10-CM | POA: Diagnosis not present

## 2016-01-15 LAB — BASIC METABOLIC PANEL
Anion gap: 7 (ref 5–15)
BUN: 16 mg/dL (ref 6–20)
CHLORIDE: 104 mmol/L (ref 101–111)
CO2: 29 mmol/L (ref 22–32)
Calcium: 9.5 mg/dL (ref 8.9–10.3)
Creatinine, Ser: 1.02 mg/dL — ABNORMAL HIGH (ref 0.44–1.00)
GFR calc non Af Amer: >60 mL/min (ref >60)
Glucose, Bld: 102 mg/dL — ABNORMAL HIGH (ref 65–99)
POTASSIUM: 3.6 mmol/L (ref 3.5–5.1)
SODIUM: 140 mmol/L (ref 135–145)

## 2016-01-15 LAB — CBC
HEMATOCRIT: 39.5 % (ref 36.0–46.0)
HEMOGLOBIN: 13.6 g/dL (ref 12.0–15.0)
MCH: 29 pg (ref 26.0–34.0)
MCHC: 34.4 g/dL (ref 30.0–36.0)
MCV: 84.2 fL (ref 78.0–100.0)
Platelets: 259 10*3/uL (ref 150–400)
RBC: 4.69 MIL/uL (ref 3.87–5.11)
RDW: 16.1 % — ABNORMAL HIGH (ref 11.5–15.5)
WBC: 8.7 10*3/uL (ref 4.0–10.5)

## 2016-01-15 NOTE — Telephone Encounter (Signed)
See below: Request for cardiac clearance and med hold for D&C.

## 2016-01-15 NOTE — Pre-Procedure Instructions (Signed)
Patient arrived to PAT without a cardiac or medical clearance. Pt has a cardiac stent and is on Plavix. Pt states she saw her PMD today and he told her to stop Plavix for 7 days prior to surgery. Dr. Delma Post informed and he wants Dr. Gwenlyn Found, her cardiologist, to make the decision re Plavix being stopped and for how long. I asked if pt needs cardiac clearance and Dr. Delma Post said "nice if we can get it but not necessary." I called and spoke with operator at Dr. Kennon Holter office and she will deliver message to him re Plavix and clearance.

## 2016-01-15 NOTE — Telephone Encounter (Signed)
New message      Request for surgical clearance:  What type of surgery is being performed?  DNC When is this surgery scheduled?  01-24-16  1. Are there any medications that need to be held prior to surgery and how long? Hold plavix?  If yes, for how long? And need cardiac clearance  Name of physician performing surgery?  Dr [name] 2. What is your office phone and fax number?  Fax [phone]

## 2016-01-15 NOTE — Patient Instructions (Signed)
Your procedure is scheduled on:01/24/16  Enter through the Main Entrance at :9am Pick up desk phone and dial [phone] and inform us of your arrival.  Please call [phone] if you have any problems the morning of surgery.  Remember: Do not eat food or drink liquids after midnight:Thursday   You may brush your teeth the morning of surgery.  Take these meds the morning of surgery with a sip of water: BP pills and pain pill as needed  DO NOT wear jewelry, eye make-up, lipstick,body lotion, or dark fingernail polish.  (Polished toes are ok) You may wear deodorant.  If you are to be admitted after surgery, leave suitcase in car until your room has been assigned. Patients discharged on the day of surgery will not be allowed to drive home. Wear loose fitting, comfortable clothes for your ride home.

## 2016-01-20 ENCOUNTER — Encounter (HOSPITAL_COMMUNITY): Payer: Self-pay | Admitting: Emergency Medicine

## 2016-01-20 ENCOUNTER — Emergency Department (HOSPITAL_COMMUNITY)
Admission: EM | Admit: 2016-01-20 | Discharge: 2016-01-21 | Disposition: A | Discharge status: Home / Self Care | Payer: Medicaid Other | Attending: Emergency Medicine | Admitting: Emergency Medicine

## 2016-01-20 ENCOUNTER — Emergency Department (HOSPITAL_COMMUNITY): Payer: Medicaid Other

## 2016-01-20 DIAGNOSIS — I10 Essential (primary) hypertension: Secondary | ICD-10-CM | POA: Diagnosis not present

## 2016-01-20 DIAGNOSIS — Z8719 Personal history of other diseases of the digestive system: Secondary | ICD-10-CM | POA: Diagnosis not present

## 2016-01-20 DIAGNOSIS — I2510 Atherosclerotic heart disease of native coronary artery without angina pectoris: Secondary | ICD-10-CM | POA: Insufficient documentation

## 2016-01-20 DIAGNOSIS — F329 Major depressive disorder, single episode, unspecified: Secondary | ICD-10-CM | POA: Insufficient documentation

## 2016-01-20 DIAGNOSIS — R000 Tachycardia, unspecified: Secondary | ICD-10-CM | POA: Insufficient documentation

## 2016-01-20 DIAGNOSIS — N390 Urinary tract infection, site not specified: Secondary | ICD-10-CM | POA: Insufficient documentation

## 2016-01-20 DIAGNOSIS — H9209 Otalgia, unspecified ear: Secondary | ICD-10-CM | POA: Insufficient documentation

## 2016-01-20 DIAGNOSIS — Z7951 Long term (current) use of inhaled steroids: Secondary | ICD-10-CM | POA: Diagnosis not present

## 2016-01-20 DIAGNOSIS — Z79899 Other long term (current) drug therapy: Secondary | ICD-10-CM | POA: Diagnosis not present

## 2016-01-20 DIAGNOSIS — Z9861 Coronary angioplasty status: Secondary | ICD-10-CM | POA: Diagnosis not present

## 2016-01-20 DIAGNOSIS — E785 Hyperlipidemia, unspecified: Secondary | ICD-10-CM | POA: Insufficient documentation

## 2016-01-20 DIAGNOSIS — R42 Dizziness and giddiness: Secondary | ICD-10-CM | POA: Insufficient documentation

## 2016-01-20 DIAGNOSIS — Z7982 Long term (current) use of aspirin: Secondary | ICD-10-CM | POA: Insufficient documentation

## 2016-01-20 DIAGNOSIS — Z8739 Personal history of other diseases of the musculoskeletal system and connective tissue: Secondary | ICD-10-CM | POA: Insufficient documentation

## 2016-01-20 DIAGNOSIS — J45901 Unspecified asthma with (acute) exacerbation: Secondary | ICD-10-CM | POA: Insufficient documentation

## 2016-01-20 DIAGNOSIS — F17210 Nicotine dependence, cigarettes, uncomplicated: Secondary | ICD-10-CM | POA: Insufficient documentation

## 2016-01-20 DIAGNOSIS — Z7902 Long term (current) use of antithrombotics/antiplatelets: Secondary | ICD-10-CM | POA: Insufficient documentation

## 2016-01-20 DIAGNOSIS — Z9889 Other specified postprocedural states: Secondary | ICD-10-CM | POA: Insufficient documentation

## 2016-01-20 DIAGNOSIS — Z91040 Latex allergy status: Secondary | ICD-10-CM | POA: Diagnosis not present

## 2016-01-20 DIAGNOSIS — I252 Old myocardial infarction: Secondary | ICD-10-CM | POA: Insufficient documentation

## 2016-01-20 DIAGNOSIS — R51 Headache: Secondary | ICD-10-CM | POA: Diagnosis not present

## 2016-01-20 DIAGNOSIS — F419 Anxiety disorder, unspecified: Secondary | ICD-10-CM | POA: Diagnosis not present

## 2016-01-20 DIAGNOSIS — R05 Cough: Secondary | ICD-10-CM | POA: Diagnosis present

## 2016-01-20 LAB — COMPREHENSIVE METABOLIC PANEL
ALBUMIN: 3.7 g/dL (ref 3.5–5.0)
ALT: 15 U/L (ref 14–54)
ANION GAP: 13 (ref 5–15)
AST: 15 U/L (ref 15–41)
Alkaline Phosphatase: 59 U/L (ref 38–126)
BILIRUBIN TOTAL: 0.5 mg/dL (ref 0.3–1.2)
BUN: 9 mg/dL (ref 6–20)
CO2: 25 mmol/L (ref 22–32)
Calcium: 9.3 mg/dL (ref 8.9–10.3)
Chloride: 102 mmol/L (ref 101–111)
Creatinine, Ser: 0.88 mg/dL (ref 0.44–1.00)
Glucose, Bld: 105 mg/dL — ABNORMAL HIGH (ref 65–99)
POTASSIUM: 3.3 mmol/L — AB (ref 3.5–5.1)
SODIUM: 140 mmol/L (ref 135–145)
TOTAL PROTEIN: 7.4 g/dL (ref 6.5–8.1)

## 2016-01-20 LAB — LIPASE, BLOOD: LIPASE: 21 U/L (ref 11–51)

## 2016-01-20 LAB — CBC
HEMATOCRIT: 38.5 % (ref 36.0–46.0)
Hemoglobin: 13 g/dL (ref 12.0–15.0)
MCH: 28.5 pg (ref 26.0–34.0)
MCHC: 33.8 g/dL (ref 30.0–36.0)
MCV: 84.4 fL (ref 78.0–100.0)
Platelets: 208 10*3/uL (ref 150–400)
RBC: 4.56 MIL/uL (ref 3.87–5.11)
RDW: 15.7 % — AB (ref 11.5–15.5)
WBC: 6.5 10*3/uL (ref 4.0–10.5)

## 2016-01-20 MED ORDER — OXYCODONE-ACETAMINOPHEN 5-325 MG PO TABS
ORAL_TABLET | ORAL | Status: AC
  Filled 2016-01-20: qty 1

## 2016-01-20 MED ORDER — ACETAMINOPHEN 325 MG PO TABS
ORAL_TABLET | ORAL | Status: AC
  Filled 2016-01-20: qty 1

## 2016-01-20 MED ORDER — ONDANSETRON 4 MG PO TBDP
4.0000 mg | ORAL_TABLET | Freq: Once | ORAL | Status: AC | PRN
  Administered 2016-01-20: 4 mg via ORAL

## 2016-01-20 MED ORDER — OXYCODONE-ACETAMINOPHEN 5-325 MG PO TABS
1.0000 | ORAL_TABLET | Freq: Once | ORAL | Status: AC
  Administered 2016-01-20: 1 via ORAL

## 2016-01-20 MED ORDER — ACETAMINOPHEN 325 MG PO TABS
650.0000 mg | ORAL_TABLET | Freq: Once | ORAL | Status: AC | PRN
  Administered 2016-01-20: 325 mg via ORAL

## 2016-01-20 MED ORDER — ONDANSETRON 4 MG PO TBDP
ORAL_TABLET | ORAL | Status: AC
  Filled 2016-01-20: qty 1

## 2016-01-20 NOTE — ED Provider Notes (Signed)
CSN: [id number]     Arrival date & time 01/20/16  2018 History  By signing my name below, I, Helane Gunther, attest that this documentation has been prepared under the direction and in the presence of Varney Biles, MD. Electronically Signed: Helane Gunther, ED Scribe. 01/21/2016. 1:10 AM.     Chief Complaint  Patient presents with  . Cough  . Headache  . Shortness of Breath   The history is provided by the patient. No language interpreter was used.   HPI Comments: Tracey Manning is a 55 y.o. female smoker at 0.5 ppd with a PMHx of CAD, MI, dyslipidemia, collagen vascular disease, HTN, asthma, and tobacco abuse who presents to the Emergency Department complaining of productive (clear saliva) cough, HA, and post-tussive SOB onset 2 days ago. She reports associated jaw pain, sore throat, ear pain, cold chills, hot flashes, dark urine, and generalized abdominal pain. She also endorses dizziness and exacerbation of the SOB when walking. She states she has been drinking a lot of fluids and has not been smoking since falling ill. She has not wanted to use her nebulizer as this makes her "feel jittery." She notes a PSHX of stent placement on 05/2011 by Dr Claiborne Billings. She notes she was scheduled for a DNC surgery 3 days ago due to irregular, heavy bleeding and right-sided suprapubic and vaginal pain. She reports she is still taking Provera, prescribed 1 week ago by her OB-GYN, which effectively relieved the vaginal bleeding this week. Pt denies CP, dysuria, and generalized myalgias.   Past Medical History  Diagnosis Date  . Coronary artery disease   . Sciatica   . Hypertension   . Myocardial infarct ([place])   . Asthma   . Dyslipidemia   . Tobacco abuse   . Chest pain   . Collagen vascular disease ([place])   . Anxiety   . Depression   . GERD (gastroesophageal reflux disease)    Past Surgical History  Procedure Laterality Date  . Cesarean section    . Dilation and curettage, diagnostic / therapeutic     . Nm myocar perf wall motion  08/2009    persantine myoview - normal perfusion in all regions, perfusion defect in anterior region (breast attenuation), EF 52%, low risk scan  . Cardiac catheterization  10/09/2003    [facility], [place]) - LAD with 30% prox narrowing, 50% stenosis in mid-portion of PLA; RCA with 40% narrowing proximally ([name], [name])  . Cardiac catheterization  10/10/2007    70% stenosis in first septal perforator branch of LAD, 60-70% narrowing in mid LAD, 20% narrowing in mid AV groove Cfx with 80% diffuse narrowing in small distal marginal, total occlusion of mid RCA with L to R collaterals, 90% stenosis diffusely in prox branch of RCA followed by 70% stenosis in secondary curve & 80% stenosis in small marginal branch ([name])  . Coronary angioplasty with stent placement  10/31/2007    PCI of distal Cfx with 2.25x36mm Taxus Adam DES, 60% narrowing of mid LAD ([name])  . Cardiac catheterization  05/28/2008    normal L main, RCA with 100% prox lesion w/distal filling from LAD collaterals, LAD with 20% prox tubular lesion/ 40% mid LAD lesion/previous stent patent ([name])  . Cardiac catheterization  09/15/2009    discrete 100% osital RCA lesion, 50% prox LAD lesion, non-obstructive disease in all coronaries ([name])  . Coronary angioplasty with stent placement  06/11/2011    PCI  of prox-mid LAD with 3x15mm DES Resolute ([name])   Family History  Problem Relation Age of Onset  . Leukemia Mother   . Clotting disorder Father     blood clot  . Hypertension Sister   . Diabetes Sister   . Stroke Sister 66  . Bleeding Disorder Son     ITP "free bleeding disorder"   Social History  Substance Use Topics  . Smoking status: Current Every Day Smoker -- 0.50 packs/day for 20 years    Types: Cigarettes  . Smokeless tobacco: Never Used  . Alcohol Use: 0.0 oz/week    0 Standard drinks or equivalent per week     Comment:  Occassionally    OB History    No data available     Review of Systems  Constitutional: Positive for chills.  HENT: Positive for ear pain and sore throat.   Respiratory: Positive for cough and shortness of breath.   Cardiovascular: Negative for chest pain.  Gastrointestinal: Positive for abdominal pain.  Genitourinary: Positive for vaginal pain. Negative for dysuria.  Musculoskeletal: Negative for myalgias.  Neurological: Positive for dizziness and headaches.    ROS 10 Systems reviewed and are negative for acute change except as noted in the HPI.     Allergies  Other; Dilaudid; and Latex  Home Medications   Prior to Admission medications   Medication Sig Start Date End Date Taking? Authorizing Provider  albuterol (PROVENTIL HFA;VENTOLIN HFA) 108 (90 BASE) MCG/ACT inhaler Inhale 2 puffs into the lungs every 6 (six) hours as needed for wheezing or shortness of breath.   Yes Historical Provider, MD  amLODipine (NORVASC) 10 MG tablet Take 10 mg by mouth daily.   Yes Historical Provider, MD  aspirin EC 325 MG tablet Take 325 mg by mouth daily.   Yes Historical Provider, MD  atorvastatin (LIPITOR) 40 MG tablet Take 1 tablet (40 mg total) by mouth daily. 01/04/14  Yes [name], MD  clopidogrel (PLAVIX) 75 MG tablet Take 1 tablet (75 mg total) by mouth daily with breakfast. 01/04/14  Yes [name], MD  fluticasone-salmeterol (ADVAIR [facility]) [id number] MCG/ACT inhaler Inhale 2 puffs into the lungs 2 (two) times daily. 01/04/14  Yes [name], MD  ibuprofen (ADVIL,MOTRIN) 800 MG tablet Take 1 tablet (800 mg total) by mouth every 8 (eight) hours as needed for mild pain or moderate pain. 12/31/15  Yes [name], MD  lisinopril-hydrochlorothiazide (PRINZIDE,ZESTORETIC) 20-12.5 MG per tablet Take 2 tablets by mouth daily.   Yes Historical Provider, MD  medroxyPROGESTERone (PROVERA) 10 MG tablet Take 1 tablet (10 mg total) by mouth daily. 12/31/15  Yes [name], MD   nitroGLYCERIN (NITROSTAT) 0.4 MG SL tablet Place 1 tablet (0.4 mg total) under the tongue every 5 (five) minutes as needed for chest pain. 06/05/15  Yes [name], MD  sertraline (ZOLOFT) 50 MG tablet Take 50 mg by mouth daily.   Yes Historical Provider, MD  zolpidem (AMBIEN) 10 MG tablet Take 10 mg by mouth at bedtime as needed for sleep.   Yes Historical Provider, MD  cephALEXin (KEFLEX) 500 MG capsule Take 1 capsule (500 mg total) by mouth 2 (two) times daily. 01/21/16   Varney Biles, MD  doxycycline (VIBRAMYCIN) 50 MG capsule Take 1 capsule (50 mg total) by mouth 2 (two) times daily. Patient not taking: Reported on 01/08/2016 11/20/15   [name], MD  ibuprofen (ADVIL,MOTRIN) 800 MG tablet Take 1 tablet (800 mg total)  by mouth every 8 (eight) hours as needed. Patient not taking: Reported on 01/08/2016 12/31/15   [name], MD  metroNIDAZOLE (FLAGYL) 500 MG tablet Take 1 tablet (500 mg total) by mouth every 12 (twelve) hours. Patient not taking: Reported on 01/08/2016 11/14/15   Samella Parr, NP  metroNIDAZOLE (METROGEL VAGINAL) 0.75 % vaginal gel Place 1 Applicatorful vaginally 2 (two) times daily. Patient not taking: Reported on 01/08/2016 11/20/15   [name], MD  oxyCODONE-acetaminophen (ROXICET) 5-325 MG tablet Take 1-2 tablets by mouth every 6 (six) hours as needed for severe pain. Patient not taking: Reported on 01/08/2016 12/24/15   Ashley Murrain, NP  predniSONE (DELTASONE) 10 MG tablet Take 6 tablets (60 mg total) by mouth daily. 01/21/16   Shakeya Kerkman Kathrynn Humble, MD   BP 169/101 mmHg  Pulse 78  Temp(Src) 98.1 F (36.7 C) (Oral)  Resp 22  SpO2 97%  LMP 01/11/2015 Physical Exam  Constitutional: She appears well-developed and well-nourished.  HENT:  Head: Normocephalic and atraumatic.  Eyes: Conjunctivae are normal. Right eye exhibits no discharge. Left eye exhibits no discharge.  Cardiovascular: Regular rhythm.  Tachycardia present.   Pulmonary/Chest: Effort  normal. No respiratory distress. She has wheezes.  Diffuse wheezing, worse in the right lung base  Abdominal: Soft. She exhibits no distension. There is no tenderness.  Mild lower quadrant tenderness, non focal  Musculoskeletal: Normal range of motion. She exhibits no edema or tenderness.  No pitting edema in the BLE, no unilateral calf swelling or TTP  Neurological: She is alert. Coordination normal.  Skin: Skin is warm and dry. No rash noted. She is not diaphoretic. No erythema.  Psychiatric: She has a normal mood and affect.  Nursing note and vitals reviewed.   ED Course  Procedures  DIAGNOSTIC STUDIES: Oxygen Saturation is 100% on RA, normal by my interpretation.    COORDINATION OF CARE: 12:19 AM - Discussed plans to order a breathing treatment and a steroid shot. Pt advised of plan for treatment and pt agrees.  Labs Review Labs Reviewed  COMPREHENSIVE METABOLIC PANEL - Abnormal; Notable for the following:    Potassium 3.3 (*)    Glucose, Bld 105 (*)    All other components within normal limits  CBC - Abnormal; Notable for the following:    RDW 15.7 (*)    All other components within normal limits  URINALYSIS, ROUTINE W REFLEX MICROSCOPIC (NOT AT [facility]) - Abnormal; Notable for the following:    Color, Urine AMBER (*)    APPearance CLOUDY (*)    Hgb urine dipstick MODERATE (*)    Bilirubin Urine SMALL (*)    Ketones, ur 15 (*)    Protein, ur 100 (*)    All other components within normal limits  URINE MICROSCOPIC-ADD ON - Abnormal; Notable for the following:    Squamous Epithelial / LPF 6-30 (*)    Bacteria, UA MANY (*)    Casts HYALINE CASTS (*)    All other components within normal limits  URINE CULTURE  LIPASE, BLOOD    Imaging Review Dg Chest 2 View  01/20/2016  CLINICAL DATA:  Cough and shortness of breath for 1. EXAM: CHEST  2 VIEW COMPARISON:  Both 01/12/2015 FINDINGS: Midline trachea. Normal heart size and mediastinal contours.Sharp costophrenic angles. No  pneumothorax. Clear lungs. Lateral view degraded by patient arm position. IMPRESSION: No active cardiopulmonary disease. Electronically Signed   By: Abigail Miyamoto M.D.   On: 01/20/2016 21:18   I have personally reviewed  and evaluated these images and lab results as part of my medical decision-making.   EKG Interpretation   Date/Time:  Tuesday January 21 2016 00:26:50 EST Ventricular Rate:  94 PR Interval:  166 QRS Duration: 87 QT Interval:  362 QTC Calculation: 453 R Axis:   77 Text Interpretation:  Sinus rhythm Baseline wander in lead(s) V6 No acute  changes No significant change since last tracing Confirmed by Kaiulani Sitton,  MD, Advay Volante [phone]) on 01/21/2016 8:18:33 AM      LATE ENTRY: Repeat exam reveals clearing of wheezing in all lung fields. Patient is not in any respiratory distress nor is there hypoxia. Ambulatory pulsox is normal. MDM   Final diagnoses:  UTI (lower urinary tract infection)  Asthma exacerbation    I personally performed the services described in this documentation, which was scribed in my presence. The recorded information has been reviewed and is accurate.  Pt comes in with cc of wheezing. Asthma exacerbation clinically. The patient was counseled on the dangers of tobacco use, and was advised to quit.  Reviewed strategies to maximize success, including removing cigarettes and smoking materials from environment, stress management and substitution of other forms of reinforcement. Discussed 2-3 min.  Pt has no hx of PE, DVT and denies any exogenous estrogen use, long distance travels or surgery in the past 6 weeks, active cancer, recent immobilization.  No ACS like clinical concerns given the clinical picture. CXR ordered, and shows no pneumonia, WC is normal. [place] is + - and she does report some discomfort with urination and lower pain - so will treat as cystitis.    CRITICAL CARE Performed by: Varney Biles   Total critical care time: 33  minutes  Critical care time was exclusive of separately billable procedures and treating other patients.  Critical care was necessary to treat or prevent imminent or life-threatening deterioration.  Critical care was time spent personally by me on the following activities: development of treatment plan with patient and/or surrogate as well as nursing, discussions with consultants, evaluation of patient's response to treatment, examination of patient, obtaining history from patient or surrogate, ordering and performing treatments and interventions, ordering and review of laboratory studies, ordering and review of radiographic studies, pulse oximetry and re-evaluation of patient's condition.    Varney Biles, MD 01/21/16 [phone]

## 2016-01-20 NOTE — ED Notes (Signed)
Patient arrives with complaint of cough, shortness of breath, muscle pain, chills. Has been using nebulizer at home with minimal improvement.

## 2016-01-21 ENCOUNTER — Other Ambulatory Visit: Payer: Self-pay

## 2016-01-21 LAB — URINALYSIS, ROUTINE W REFLEX MICROSCOPIC
Glucose, UA: NEGATIVE mg/dL
KETONES UR: 15 mg/dL — AB
LEUKOCYTES UA: NEGATIVE
NITRITE: NEGATIVE
PH: 5.5 (ref 5.0–8.0)
PROTEIN: 100 mg/dL — AB
Specific Gravity, Urine: 1.027 (ref 1.005–1.030)

## 2016-01-21 LAB — URINE MICROSCOPIC-ADD ON

## 2016-01-21 MED ORDER — DEXAMETHASONE SODIUM PHOSPHATE 10 MG/ML IJ SOLN
10.0000 mg | Freq: Once | INTRAMUSCULAR | Status: AC
  Administered 2016-01-21: 10 mg via INTRAMUSCULAR
  Filled 2016-01-21: qty 1

## 2016-01-21 MED ORDER — PREDNISONE 10 MG PO TABS: 60.0000 mg | ORAL_TABLET | Freq: Every day | ORAL | Status: DC

## 2016-01-21 MED ORDER — CEPHALEXIN 500 MG PO CAPS: 500.0000 mg | ORAL_CAPSULE | Freq: Two times a day (BID) | ORAL | Status: DC

## 2016-01-21 MED ORDER — BENZONATATE 100 MG PO CAPS
100.0000 mg | ORAL_CAPSULE | Freq: Once | ORAL | Status: AC
  Administered 2016-01-21: 100 mg via ORAL
  Filled 2016-01-21: qty 1

## 2016-01-21 MED ORDER — CEPHALEXIN 250 MG PO CAPS
500.0000 mg | ORAL_CAPSULE | Freq: Once | ORAL | Status: AC
  Administered 2016-01-21: 500 mg via ORAL
  Filled 2016-01-21: qty 2

## 2016-01-21 MED ORDER — LEVALBUTEROL HCL 1.25 MG/0.5ML IN NEBU
1.2500 mg | INHALATION_SOLUTION | RESPIRATORY_TRACT | Status: DC | PRN
  Administered 2016-01-21: 1.25 mg via RESPIRATORY_TRACT
  Filled 2016-01-21: qty 0.5

## 2016-01-21 NOTE — ED Notes (Signed)
Pt ambulated in hallway steady gait O2 sat at 96%

## 2016-01-21 NOTE — Discharge Instructions (Signed)
We saw you in the ER for your asthma related complains. We gave you some breathing treatments in the ER, and seems like your symptoms have improved. Please take albuterol as needed every 4 hours. Please take the medications prescribed. Please refrain from smoking or smoke exposure. Please see a primary care doctor in 1 week. Return to the ER if your symptoms worsen.  You also have a uti - take the antibiotics as prescribed.   Asthma, Adult Asthma is a recurring condition in which the airways tighten and narrow. Asthma can make it difficult to breathe. It can cause coughing, wheezing, and shortness of breath. Asthma episodes, also called asthma attacks, range from minor to life-threatening. Asthma cannot be cured, but medicines and lifestyle changes can help control it. CAUSES Asthma is believed to be caused by inherited (genetic) and environmental factors, but its exact cause is unknown. Asthma may be triggered by allergens, lung infections, or irritants in the air. Asthma triggers are different for each person. Common triggers include:   Animal dander.  Dust mites.  Cockroaches.  Pollen from trees or grass.  Mold.  Smoke.  Air pollutants such as dust, household cleaners, hair sprays, aerosol sprays, paint fumes, strong chemicals, or strong odors.  Cold air, weather changes, and winds (which increase molds and pollens in the air).  Strong emotional expressions such as crying or laughing hard.  Stress.  Certain medicines (such as aspirin) or types of drugs (such as beta-blockers).  Sulfites in foods and drinks. Foods and drinks that may contain sulfites include dried fruit, potato chips, and sparkling grape juice.  Infections or inflammatory conditions such as the flu, a cold, or an inflammation of the nasal membranes (rhinitis).  Gastroesophageal reflux disease (GERD).  Exercise or strenuous activity. SYMPTOMS Symptoms may occur immediately after asthma is triggered or  many hours later. Symptoms include:  Wheezing.  Excessive nighttime or early morning coughing.  Frequent or severe coughing with a common cold.  Chest tightness.  Shortness of breath. DIAGNOSIS  The diagnosis of asthma is made by a review of your medical history and a physical exam. Tests may also be performed. These may include:  Lung function studies. These tests show how much air you breathe in and out.  Allergy tests.  Imaging tests such as X-rays. TREATMENT  Asthma cannot be cured, but it can usually be controlled. Treatment involves identifying and avoiding your asthma triggers. It also involves medicines. There are 2 classes of medicine used for asthma treatment:   Controller medicines. These prevent asthma symptoms from occurring. They are usually taken every day.  Reliever or rescue medicines. These quickly relieve asthma symptoms. They are used as needed and provide short-term relief. Your health care provider will help you create an asthma action plan. An asthma action plan is a written plan for managing and treating your asthma attacks. It includes a list of your asthma triggers and how they may be avoided. It also includes information on when medicines should be taken and when their dosage should be changed. An action plan may also involve the use of a device called a peak flow meter. A peak flow meter measures how well the lungs are working. It helps you monitor your condition. HOME CARE INSTRUCTIONS   Take medicines only as directed by your health care provider. Speak with your health care provider if you have questions about how or when to take the medicines.  Use a peak flow meter as directed by your health care  provider. Record and keep track of readings.  Understand and use the action plan to help minimize or stop an asthma attack without needing to seek medical care.  Control your home environment in the following ways to help prevent asthma attacks:  Do not  smoke. Avoid being exposed to secondhand smoke.  Change your heating and air conditioning filter regularly.  Limit your use of fireplaces and wood stoves.  Get rid of pests (such as roaches and mice) and their droppings.  Throw away plants if you see mold on them.  Clean your floors and dust regularly. Use unscented cleaning products.  Try to have someone else vacuum for you regularly. Stay out of rooms while they are being vacuumed and for a short while afterward. If you vacuum, use a dust mask from a hardware store, a double-layered or microfilter vacuum cleaner bag, or a vacuum cleaner with a HEPA filter.  Replace carpet with wood, tile, or vinyl flooring. Carpet can trap dander and dust.  Use allergy-proof pillows, mattress covers, and box spring covers.  Wash bed sheets and blankets every week in hot water and dry them in a dryer.  Use blankets that are made of polyester or cotton.  Clean bathrooms and kitchens with bleach. If possible, have someone repaint the walls in these rooms with mold-resistant paint. Keep out of the rooms that are being cleaned and painted.  Wash hands frequently. SEEK MEDICAL CARE IF:   You have wheezing, shortness of breath, or a cough even if taking medicine to prevent attacks.  The colored mucus you cough up (sputum) is thicker than usual.  Your sputum changes from clear or white to yellow, green, gray, or bloody.  You have any problems that may be related to the medicines you are taking (such as a rash, itching, swelling, or trouble breathing).  You are using a reliever medicine more than 2-3 times per week.  Your peak flow is still at 50-79% of your personal best after following your action plan for 1 hour.  You have a fever. SEEK IMMEDIATE MEDICAL CARE IF:   You seem to be getting worse and are unresponsive to treatment during an asthma attack.  You are short of breath even at rest.  You get short of breath when doing very little  physical activity.  You have difficulty eating, drinking, or talking due to asthma symptoms.  You develop chest pain.  You develop a fast heartbeat.  You have a bluish color to your lips or fingernails.  You are light-headed, dizzy, or faint.  Your peak flow is less than 50% of your personal best.   This information is not intended to replace advice given to you by your health care provider. Make sure you discuss any questions you have with your health care provider.   Document Released: 11/09/2005 Document Revised: 07/31/2015 Document Reviewed: 06/08/2013 Elsevier Interactive Patient Education 2016 [employer]  Urinary Tract Infection A urinary tract infection (UTI) can occur any place along the urinary tract. The tract includes the kidneys, ureters, bladder, and urethra. A type of germ called bacteria often causes a UTI. UTIs are often helped with antibiotic medicine.  HOME CARE   If given, take antibiotics as told by your doctor. Finish them even if you start to feel better.  Drink enough fluids to keep your pee (urine) clear or pale yellow.  Avoid tea, drinks with caffeine, and bubbly (carbonated) drinks.  Pee often. Avoid holding your pee in for a long time.  Pee before and after having sex (intercourse).  Wipe from front to back after you poop (bowel movement) if you are a woman. Use each tissue only once. GET HELP RIGHT AWAY IF:   You have back pain.  You have lower belly (abdominal) pain.  You have chills.  You feel sick to your stomach (nauseous).  You throw up (vomit).  Your burning or discomfort with peeing does not go away.  You have a fever.  Your symptoms are not better in 3 days. MAKE SURE YOU:   Understand these instructions.  Will watch your condition.  Will get help right away if you are not doing well or get worse.   This information is not intended to replace advice given to you by your health care provider. Make sure you discuss any  questions you have with your health care provider.   Document Released: 04/27/2008 Document Revised: 11/30/2014 Document Reviewed: 06/09/2012 Elsevier Interactive Patient Education [employer].

## 2016-01-21 NOTE — ED Notes (Signed)
Pt. Ambulate [id number]

## 2016-01-22 LAB — URINE CULTURE

## 2016-01-23 MED ORDER — SODIUM CHLORIDE 0.9 % IV SOLN
3.0000 g | INTRAVENOUS | Status: DC
  Filled 2016-01-23: qty 3

## 2016-01-24 ENCOUNTER — Ambulatory Visit (HOSPITAL_COMMUNITY)
Admission: RE | Admit: 2016-01-24 | Discharge: 2016-01-24 | Disposition: A | Discharge status: Home / Self Care | Payer: Medicaid Other | Source: Ambulatory Visit | Attending: Obstetrics | Admitting: Obstetrics

## 2016-01-24 ENCOUNTER — Encounter (HOSPITAL_COMMUNITY): Payer: Self-pay | Admitting: Anesthesiology

## 2016-01-24 ENCOUNTER — Ambulatory Visit (HOSPITAL_COMMUNITY): Payer: Medicaid Other | Admitting: Anesthesiology

## 2016-01-24 ENCOUNTER — Encounter (HOSPITAL_COMMUNITY)
Admission: RE | Disposition: A | Discharge status: Home / Self Care | Payer: Self-pay | Source: Ambulatory Visit | Attending: Obstetrics

## 2016-01-24 DIAGNOSIS — B9689 Other specified bacterial agents as the cause of diseases classified elsewhere: Secondary | ICD-10-CM

## 2016-01-24 DIAGNOSIS — R102 Pelvic and perineal pain unspecified side: Secondary | ICD-10-CM

## 2016-01-24 DIAGNOSIS — N760 Acute vaginitis: Secondary | ICD-10-CM

## 2016-01-24 DIAGNOSIS — M25511 Pain in right shoulder: Secondary | ICD-10-CM

## 2016-01-24 DIAGNOSIS — R079 Chest pain, unspecified: Secondary | ICD-10-CM

## 2016-01-24 DIAGNOSIS — E785 Hyperlipidemia, unspecified: Secondary | ICD-10-CM

## 2016-01-24 DIAGNOSIS — I10 Essential (primary) hypertension: Secondary | ICD-10-CM

## 2016-01-24 DIAGNOSIS — N938 Other specified abnormal uterine and vaginal bleeding: Secondary | ICD-10-CM

## 2016-01-24 SURGERY — DILATATION & CURETTAGE/HYSTEROSCOPY WITH HYDROTHERMAL ABLATION
Anesthesia: General

## 2016-01-24 MED ORDER — KETOROLAC TROMETHAMINE 30 MG/ML IJ SOLN
INTRAMUSCULAR | Status: AC
  Filled 2016-01-24: qty 1

## 2016-01-24 MED ORDER — DEXAMETHASONE SODIUM PHOSPHATE 4 MG/ML IJ SOLN
INTRAMUSCULAR | Status: AC
  Filled 2016-01-24: qty 1

## 2016-01-24 MED ORDER — LIDOCAINE HCL (CARDIAC) 20 MG/ML IV SOLN
INTRAVENOUS | Status: AC
  Filled 2016-01-24: qty 5

## 2016-01-24 MED ORDER — SCOPOLAMINE 1 MG/3DAYS TD PT72: 1.0000 | MEDICATED_PATCH | Freq: Once | TRANSDERMAL | Status: DC

## 2016-01-24 MED ORDER — LACTATED RINGERS IV SOLN: INTRAVENOUS | Status: DC

## 2016-01-24 MED ORDER — SCOPOLAMINE 1 MG/3DAYS TD PT72
MEDICATED_PATCH | TRANSDERMAL | Status: AC
  Filled 2016-01-24: qty 1

## 2016-01-24 MED ORDER — PROPOFOL 10 MG/ML IV BOLUS
INTRAVENOUS | Status: AC
  Filled 2016-01-24: qty 20

## 2016-01-24 MED ORDER — ONDANSETRON HCL 4 MG/2ML IJ SOLN
INTRAMUSCULAR | Status: AC
  Filled 2016-01-24: qty 2

## 2016-01-24 NOTE — H&P (Signed)
Tracey Manning is an 55 y.o. female. Presents for [facility], Hysteroscopy and HTA for AUB.  Pertinent Gynecological History: Menses: flow is excessive with use of several pads or tampons on heaviest days Bleeding: dysfunctional uterine bleeding Contraception: none DES exposure: denies Blood transfusions: unknown Sexually transmitted diseases: no past history Previous GYN Procedures: DNC  Last mammogram: normal Date: 2016 Last pap: normal    Date: 2016 Patient's last menstrual period was 10/24/2015.    Past Medical History  Diagnosis Date  . Coronary artery disease   . Sciatica   . Hypertension   . Myocardial infarct ([place])   . Asthma   . Dyslipidemia   . Tobacco abuse   . Chest pain   . Collagen vascular disease ([place])   . Anxiety   . Depression   . GERD (gastroesophageal reflux disease)     Past Surgical History  Procedure Laterality Date  . Cesarean section    . Dilation and curettage, diagnostic / therapeutic    . Nm myocar perf wall motion  08/2009    persantine myoview - normal perfusion in all regions, perfusion defect in anterior region (breast attenuation), EF 52%, low risk scan  . Cardiac catheterization  10/09/2003    [facility] [place], [place]) - LAD with 30% prox narrowing, 50% stenosis in mid-portion of PLA; RCA with 40% narrowing proximally ([name], [name])  . Cardiac catheterization  10/10/2007    70% stenosis in first septal perforator branch of LAD, 60-70% narrowing in mid LAD, 20% narrowing in mid AV groove Cfx with 80% diffuse narrowing in small distal marginal, total occlusion of mid RCA with L to R collaterals, 90% stenosis diffusely in prox branch of RCA followed by 70% stenosis in secondary curve & 80% stenosis in small marginal branch ([name])  . Coronary angioplasty with stent placement  10/31/2007    PCI of distal Cfx with 2.25x63mm Taxus Adam DES, 60% narrowing of mid LAD ([name])  . Cardiac catheterization  05/28/2008    normal L  main, RCA with 100% prox lesion w/distal filling from LAD collaterals, LAD with 20% prox tubular lesion/ 40% mid LAD lesion/previous stent patent ([name])  . Cardiac catheterization  09/15/2009    discrete 100% osital RCA lesion, 50% prox LAD lesion, non-obstructive disease in all coronaries ([name])  . Coronary angioplasty with stent placement  06/11/2011    PCI of prox-mid LAD with 3x28mm DES Resolute ([name])    Family History  Problem Relation Age of Onset  . Leukemia Mother   . Clotting disorder Father     blood clot  . Hypertension Sister   . Diabetes Sister   . Stroke Sister 73  . Bleeding Disorder Son     ITP "free bleeding disorder"    Social History:  reports that she has been smoking Cigarettes.  She has a 10 pack-year smoking history. She has never used smokeless tobacco. She reports that she drinks alcohol. She reports that she uses illicit drugs (Marijuana).  Allergies:  Allergies  Allergen Reactions  . Other Anaphylaxis and Hives    Fresh strawberries (throat swelled)  . Dilaudid [Hydromorphone Hcl] Nausea And Vomiting  . Latex Swelling    No reaction with bandaids    Prescriptions prior to admission  Medication Sig Dispense Refill Last Dose  . albuterol (PROVENTIL HFA;VENTOLIN HFA) 108 (90 BASE) MCG/ACT inhaler Inhale 2 puffs into the lungs every 6 (six) hours as needed for wheezing  or shortness of breath.   01/20/2016 at Unknown time  . amLODipine (NORVASC) 10 MG tablet Take 10 mg by mouth daily.   01/20/2016 at Unknown time  . aspirin EC 325 MG tablet Take 325 mg by mouth daily.   01/20/2016 at Unknown time  . atorvastatin (LIPITOR) 40 MG tablet Take 1 tablet (40 mg total) by mouth daily. 30 tablet 2 01/19/2016 at Unknown time  . clopidogrel (PLAVIX) 75 MG tablet Take 1 tablet (75 mg total) by mouth daily with breakfast. 30 tablet 2 01/20/2016 at Unknown time  . fluticasone-salmeterol (ADVAIR HFA) 115-21 MCG/ACT inhaler Inhale 2 puffs into the  lungs 2 (two) times daily. 1 Inhaler 12 01/20/2016 at Unknown time  . ibuprofen (ADVIL,MOTRIN) 800 MG tablet Take 1 tablet (800 mg total) by mouth every 8 (eight) hours as needed for mild pain or moderate pain. 30 tablet 5 01/20/2016 at Unknown time  . lisinopril-hydrochlorothiazide (PRINZIDE,ZESTORETIC) 20-12.5 MG per tablet Take 2 tablets by mouth daily.   01/20/2016 at Unknown time  . medroxyPROGESTERone (PROVERA) 10 MG tablet Take 1 tablet (10 mg total) by mouth daily. 30 tablet 0 01/20/2016 at Unknown time  . nitroGLYCERIN (NITROSTAT) 0.4 MG SL tablet Place 1 tablet (0.4 mg total) under the tongue every 5 (five) minutes as needed for chest pain. 25 tablet 6 unknown  . sertraline (ZOLOFT) 50 MG tablet Take 50 mg by mouth daily.   01/20/2016 at Unknown time  . zolpidem (AMBIEN) 10 MG tablet Take 10 mg by mouth at bedtime as needed for sleep.   01/19/2016 at Unknown time  . cephALEXin (KEFLEX) 500 MG capsule Take 1 capsule (500 mg total) by mouth 2 (two) times daily. 14 capsule 0   . doxycycline (VIBRAMYCIN) 50 MG capsule Take 1 capsule (50 mg total) by mouth 2 (two) times daily. (Patient not taking: Reported on 01/08/2016) 14 capsule 0 Completed Course at Unknown time  . ibuprofen (ADVIL,MOTRIN) 800 MG tablet Take 1 tablet (800 mg total) by mouth every 8 (eight) hours as needed. (Patient not taking: Reported on 01/08/2016) 30 tablet 5 Not Taking at Unknown time  . metroNIDAZOLE (FLAGYL) 500 MG tablet Take 1 tablet (500 mg total) by mouth every 12 (twelve) hours. (Patient not taking: Reported on 01/08/2016) 14 tablet 0 Completed Course at Unknown time  . metroNIDAZOLE (METROGEL VAGINAL) 0.75 % vaginal gel Place 1 Applicatorful vaginally 2 (two) times daily. (Patient not taking: Reported on 01/08/2016) 70 g 2 Completed Course at Unknown time  . oxyCODONE-acetaminophen (ROXICET) 5-325 MG tablet Take 1-2 tablets by mouth every 6 (six) hours as needed for severe pain. (Patient not taking: Reported on 01/08/2016) 15  tablet 0 Not Taking at Unknown time  . predniSONE (DELTASONE) 10 MG tablet Take 6 tablets (60 mg total) by mouth daily. 30 tablet 0     Review of Systems  Respiratory: Positive for cough, sputum production, shortness of breath and wheezing.   All other systems reviewed and are negative.   Last menstrual period 10/24/2015. Physical Exam  Nursing note and vitals reviewed. Constitutional: She is oriented to person, place, and time. She appears well-developed and well-nourished. She appears distressed.  HENT:  Head: Normocephalic and atraumatic.  Eyes: Conjunctivae are normal. Pupils are equal, round, and reactive to light.  Neck: Normal range of motion. Neck supple.  Cardiovascular: Normal rate, regular rhythm and normal heart sounds.   Respiratory: She is in respiratory distress. She has wheezes. She has rales.  GI: Soft.  Musculoskeletal: Normal range  of motion.  Neurological: She is alert and oriented to person, place, and time.  Skin: Skin is warm and dry.  Psychiatric: She has a normal mood and affect. Her behavior is normal. Judgment and thought content normal.    No results found for this or any previous visit (from the past 24 hour(s)).  No results found.  Assessment/Plan: AUB.  For D&C, Hysteroscopy and HTA but has elevated BP and respiratory distress.  Discussed with Anesthesia, Dr. Jillyn Hidden, and decision made to cancel case.  Will reschedule.  Patient agrees with assessment and decision to cancel.  HARPER,CHARLES A 01/24/2016, 9:33 AM

## 2016-01-25 NOTE — Telephone Encounter (Signed)
Cleared for D & C at low risk . OK to hold plavix

## 2016-01-29 ENCOUNTER — Encounter: Payer: Self-pay | Admitting: Cardiovascular Disease

## 2016-01-29 ENCOUNTER — Ambulatory Visit (INDEPENDENT_AMBULATORY_CARE_PROVIDER_SITE_OTHER): Payer: Medicaid Other | Admitting: Cardiovascular Disease

## 2016-01-29 VITALS — BP 136/98 | HR 88 | Ht 61.0 in | Wt 208.6 lb

## 2016-01-29 DIAGNOSIS — I2510 Atherosclerotic heart disease of native coronary artery without angina pectoris: Secondary | ICD-10-CM | POA: Diagnosis not present

## 2016-01-29 DIAGNOSIS — I10 Essential (primary) hypertension: Secondary | ICD-10-CM | POA: Diagnosis not present

## 2016-01-29 DIAGNOSIS — I2583 Coronary atherosclerosis due to lipid rich plaque: Principal | ICD-10-CM

## 2016-01-29 NOTE — Progress Notes (Signed)
01/29/2016 Tracey Manning   05-Mar-1961  [id number]  Primary Physician [name], MD Primary Cardiologist: [name] MD Renae Gloss   HPI:  Tracey Manning is a 55-year-old moderately overweight single [place] American female mother of 2 children, grandmother and 2 grandchildren who currently is out of work on [employer] and disability. I last saw her in the office 06/05/15.She was referred by Dr. Shawna Orleans for cardiovascular evaluation because of chest pain. Her cardiac risk factor profile is positive for 20-pack-years of tobacco abuse currently smoking one pack per day, treated type of pressure and high cholesterol. She has had multiple heart attacks in the past dating back to 2004. She had a stent placed in her circumflex by Dr. Einar Gip December 2008 (Taxus 2.5 x 24 mm). She had a Medtronic stent placed in her proximal LAD by Dr. Claiborne Billings 06/11/11. Over the last 6 months she has developed recurrent chest pain as well as dyspnea on exertion. Since I saw her on 04/04/14 I obtained a Myoview stress test which was normal and a 2-D echo which revealed normal LV function. Since I saw her back in July 2016 she's done well clinically. She denies chest pain or shortness of breath. Her blood pressure is somewhat elevated due to back and abdominal pain however.   Current Outpatient Prescriptions  Medication Sig Dispense Refill  . albuterol (PROVENTIL HFA;VENTOLIN HFA) 108 (90 BASE) MCG/ACT inhaler Inhale 2 puffs into the lungs every 6 (six) hours as needed for wheezing or shortness of breath.    Marland Kitchen amLODipine (NORVASC) 10 MG tablet Take 10 mg by mouth daily.    Marland Kitchen aspirin EC 325 MG tablet Take 325 mg by mouth daily.    Marland Kitchen atorvastatin (LIPITOR) 40 MG tablet Take 1 tablet (40 mg total) by mouth daily. 30 tablet 2  . cephALEXin (KEFLEX) 500 MG capsule Take 1 capsule (500 mg total) by mouth 2 (two) times daily. 14 capsule 0  . clopidogrel (PLAVIX) 75 MG tablet Take 1 tablet (75 mg total) by  mouth daily with breakfast. 30 tablet 2  . doxycycline (VIBRAMYCIN) 50 MG capsule Take 1 capsule (50 mg total) by mouth 2 (two) times daily. 14 capsule 0  . fluticasone-salmeterol (ADVAIR HFA) 115-21 MCG/ACT inhaler Inhale 2 puffs into the lungs 2 (two) times daily. 1 Inhaler 12  . ibuprofen (ADVIL,MOTRIN) 800 MG tablet Take 1 tablet (800 mg total) by mouth every 8 (eight) hours as needed for mild pain or moderate pain. 30 tablet 5  . lisinopril-hydrochlorothiazide (PRINZIDE,ZESTORETIC) 20-12.5 MG per tablet Take 2 tablets by mouth daily.    . medroxyPROGESTERone (PROVERA) 10 MG tablet Take 1 tablet (10 mg total) by mouth daily. 30 tablet 0  . nitroGLYCERIN (NITROSTAT) 0.4 MG SL tablet Place 1 tablet (0.4 mg total) under the tongue every 5 (five) minutes as needed for chest pain. 25 tablet 6  . sertraline (ZOLOFT) 50 MG tablet Take 50 mg by mouth daily.    Marland Kitchen zolpidem (AMBIEN) 10 MG tablet Take 10 mg by mouth at bedtime as needed for sleep.     No current facility-administered medications for this visit.    Allergies  Allergen Reactions  . Other Anaphylaxis and Hives    Fresh strawberries (throat swelled)  . Dilaudid [Hydromorphone Hcl] Nausea And Vomiting  . Latex Swelling    No reaction with bandaids    Social History   Social History  . Marital Status: Single    Spouse Name: N/A  .  Number of Children: 2  . Years of Education: 12   Occupational History  . Not on file.   Social History Main Topics  . Smoking status: Current Every Day Smoker -- 0.50 packs/day for 20 years    Types: Cigarettes  . Smokeless tobacco: Never Used  . Alcohol Use: 0.0 oz/week    0 Standard drinks or equivalent per week     Comment: Occassionally   . Drug Use: Yes    Special: Marijuana     Comment: Occassionally  . Sexual Activity: Not Currently   Other Topics Concern  . Not on file   Social History Narrative     Review of Systems: General: negative for chills, fever, night sweats or weight  changes.  Cardiovascular: negative for chest pain, dyspnea on exertion, edema, orthopnea, palpitations, paroxysmal nocturnal dyspnea or shortness of breath Dermatological: negative for rash Respiratory: negative for cough or wheezing Urologic: negative for hematuria Abdominal: negative for nausea, vomiting, diarrhea, bright red blood per rectum, melena, or hematemesis Neurologic: negative for visual changes, syncope, or dizziness All other systems reviewed and are otherwise negative except as noted above.    Blood pressure 136/98, pulse 88, height 5\' 1"  (1.549 m), weight 208 lb 9.6 oz (94.62 kg), last menstrual period 01/11/2015.  General appearance: alert and no distress Neck: no adenopathy, no carotid bruit, no JVD, supple, symmetrical, trachea midline and thyroid not enlarged, symmetric, no tenderness/mass/nodules Lungs: clear to auscultation bilaterally Heart: regular rate and rhythm, S1, S2 normal, no murmur, click, rub or gallop Extremities: extremities normal, atraumatic, no cyanosis or edema  EKG not performed today  ASSESSMENT AND PLAN:   Essential hypertension History of hypertension with blood pressure measured at 136/98. She does state that she is in a lot of pain  Accounting for her elevated blood pressure. She is on amlodipine, lisinopril and hydrochlorothiazide. Continue current meds at current dose  Hyperlipidemia History of hyperlipidemia on statin therapy followed by her PCP  Coronary artery disease History of CAD status post multiple heart attacks in the past. She had a stent placed by Dr. Einar Gip  December 2008 (Taxus 2.5 mm x 24 mm). She had Medtronic stent placed approximately the by Dr. Claiborne Billings 06/11/11. Her last Myoview performed 04/04/14 was nonischemic. She denies chest pain.      [name] MD FACP,FACC,FAHA, [facility] 01/29/2016 1:55 PM

## 2016-01-29 NOTE — Patient Instructions (Signed)

## 2016-01-29 NOTE — Assessment & Plan Note (Signed)
History of CAD status post multiple heart attacks in the past. She had a stent placed by Dr. Einar Gip  December 2008 (Taxus 2.5 mm x 24 mm). She had Medtronic stent placed approximately the by Dr. Claiborne Billings 06/11/11. Her last Myoview performed 04/04/14 was nonischemic. She denies chest pain.

## 2016-01-29 NOTE — Assessment & Plan Note (Signed)
History of hypertension with blood pressure measured at 136/98. She does state that she is in a lot of pain  Accounting for her elevated blood pressure. She is on amlodipine, lisinopril and hydrochlorothiazide. Continue current meds at current dose

## 2016-01-29 NOTE — Assessment & Plan Note (Signed)
History of hyperlipidemia on statin therapy followed by her PCP. 

## 2016-01-30 NOTE — Telephone Encounter (Signed)
Clearance faxed attn; Marcie Bal

## 2016-01-30 NOTE — Telephone Encounter (Signed)
Per Verbal Order: Okay to hold Plavix for 7 days prior to procedure.

## 2016-02-11 ENCOUNTER — Ambulatory Visit: Payer: Medicaid Other | Admitting: Obstetrics

## 2016-02-11 ENCOUNTER — Ambulatory Visit (INDEPENDENT_AMBULATORY_CARE_PROVIDER_SITE_OTHER): Payer: Medicaid Other | Admitting: Obstetrics

## 2016-02-11 ENCOUNTER — Encounter: Payer: Self-pay | Admitting: Obstetrics

## 2016-02-11 VITALS — BP 156/95 | HR 67 | Wt 208.0 lb

## 2016-02-11 DIAGNOSIS — J45909 Unspecified asthma, uncomplicated: Secondary | ICD-10-CM | POA: Diagnosis not present

## 2016-02-11 DIAGNOSIS — I2510 Atherosclerotic heart disease of native coronary artery without angina pectoris: Secondary | ICD-10-CM

## 2016-02-11 DIAGNOSIS — N946 Dysmenorrhea, unspecified: Secondary | ICD-10-CM

## 2016-02-11 DIAGNOSIS — N939 Abnormal uterine and vaginal bleeding, unspecified: Secondary | ICD-10-CM

## 2016-02-11 MED ORDER — MEDROXYPROGESTERONE ACETATE 10 MG PO TABS: 10.0000 mg | ORAL_TABLET | Freq: Every day | ORAL | Status: DC

## 2016-02-11 MED ORDER — OXYCODONE-ACETAMINOPHEN 10-325 MG PO TABS: 1.0000 | ORAL_TABLET | ORAL | Status: DC | PRN

## 2016-02-11 NOTE — Progress Notes (Signed)
Patient ID: Tracey Manning, female   DOB: [birth year], 55 y.o.   MRN: [id number]  Chief Complaint  Patient presents with  . Follow-up    Patient states she is having some abnormal uterine bleeding. Patient had to cancel her abaltion surgery earlier this month because she was sick. Patient states she is taking the Provera. Patient states she is in pain and Ibuprofen is not helping with the pain.     HPI Tracey Manning is a 55 y.o. female.  H/O AUB.  Also has asthma and cardiovascular disease.  Was scheduled for endometrial ablation but procedure was cancelled because an asthmatic exacerbation and poor pulmonary function.  Patient would like to reschedule procedure.   HPI  Past Medical History  Diagnosis Date  . Coronary artery disease   . Sciatica   . Hypertension   . Myocardial infarct ([place])   . Asthma   . Dyslipidemia   . Tobacco abuse   . Chest pain   . Collagen vascular disease ([place])   . Anxiety   . Depression   . GERD (gastroesophageal reflux disease)     Past Surgical History  Procedure Laterality Date  . Cesarean section    . Dilation and curettage, diagnostic / therapeutic    . Nm myocar perf wall motion  08/2009    persantine myoview - normal perfusion in all regions, perfusion defect in anterior region (breast attenuation), EF 52%, low risk scan  . Cardiac catheterization  10/09/2003    [facility] [place], [place]) - LAD with 30% prox narrowing, 50% stenosis in mid-portion of PLA; RCA with 40% narrowing proximally ([name], [name])  . Cardiac catheterization  10/10/2007    70% stenosis in first septal perforator branch of LAD, 60-70% narrowing in mid LAD, 20% narrowing in mid AV groove Cfx with 80% diffuse narrowing in small distal marginal, total occlusion of mid RCA with L to R collaterals, 90% stenosis diffusely in prox branch of RCA followed by 70% stenosis in secondary curve & 80% stenosis in small marginal branch ([name])  . Coronary angioplasty  with stent placement  10/31/2007    PCI of distal Cfx with 2.25x68mm Taxus Adam DES, 60% narrowing of mid LAD ([name])  . Cardiac catheterization  05/28/2008    normal L main, RCA with 100% prox lesion w/distal filling from LAD collaterals, LAD with 20% prox tubular lesion/ 40% mid LAD lesion/previous stent patent ([name])  . Cardiac catheterization  09/15/2009    discrete 100% osital RCA lesion, 50% prox LAD lesion, non-obstructive disease in all coronaries ([name])  . Coronary angioplasty with stent placement  06/11/2011    PCI of prox-mid LAD with 3x45mm DES Resolute ([name])    Family History  Problem Relation Age of Onset  . Leukemia Mother   . Clotting disorder Father     blood clot  . Hypertension Sister   . Diabetes Sister   . Stroke Sister 81  . Bleeding Disorder Son     ITP "free bleeding disorder"    Social History Social History  Substance Use Topics  . Smoking status: Current Every Day Smoker -- 0.50 packs/day for 20 years    Types: Cigarettes  . Smokeless tobacco: Never Used  . Alcohol Use: 0.0 oz/week    0 Standard drinks or equivalent per week     Comment: Occassionally     Allergies  Allergen Reactions  . Other Anaphylaxis and Hives  Fresh strawberries (throat swelled)  . Dilaudid [Hydromorphone Hcl] Nausea And Vomiting  . Latex Swelling    No reaction with bandaids    Current Outpatient Prescriptions  Medication Sig Dispense Refill  . albuterol (PROVENTIL HFA;VENTOLIN HFA) 108 (90 BASE) MCG/ACT inhaler Inhale 2 puffs into the lungs every 6 (six) hours as needed for wheezing or shortness of breath.    Marland Kitchen amLODipine (NORVASC) 10 MG tablet Take 10 mg by mouth daily.    Marland Kitchen aspirin EC 325 MG tablet Take 325 mg by mouth daily.    Marland Kitchen atorvastatin (LIPITOR) 40 MG tablet Take 1 tablet (40 mg total) by mouth daily. 30 tablet 2  . clopidogrel (PLAVIX) 75 MG tablet Take 1 tablet (75 mg total) by mouth daily with breakfast. 30 tablet 2  .  fluticasone-salmeterol (ADVAIR HFA) 115-21 MCG/ACT inhaler Inhale 2 puffs into the lungs 2 (two) times daily. 1 Inhaler 12  . ibuprofen (ADVIL,MOTRIN) 800 MG tablet Take 1 tablet (800 mg total) by mouth every 8 (eight) hours as needed for mild pain or moderate pain. 30 tablet 5  . lisinopril-hydrochlorothiazide (PRINZIDE,ZESTORETIC) 20-12.5 MG per tablet Take 2 tablets by mouth daily.    . medroxyPROGESTERone (PROVERA) 10 MG tablet Take 1 tablet (10 mg total) by mouth daily. 30 tablet 0  . sertraline (ZOLOFT) 50 MG tablet Take 50 mg by mouth daily.    Marland Kitchen zolpidem (AMBIEN) 10 MG tablet Take 10 mg by mouth at bedtime as needed for sleep.    . nitroGLYCERIN (NITROSTAT) 0.4 MG SL tablet Place 1 tablet (0.4 mg total) under the tongue every 5 (five) minutes as needed for chest pain. (Patient not taking: Reported on 02/11/2016) 25 tablet 6  . oxyCODONE-acetaminophen (PERCOCET) 10-325 MG tablet Take 1 tablet by mouth every 4 (four) hours as needed for pain. 40 tablet 0   No current facility-administered medications for this visit.    Review of Systems Review of Systems Constitutional: negative for fatigue and weight loss Respiratory: positive for asthma Cardiovascular: positive for cardiovascular disease Gastrointestinal: negative for abdominal pain and change in bowel habits Genitourinary:negative Integument/breast: negative for nipple discharge Musculoskeletal:negative for myalgias Neurological: negative for gait problems and tremors Behavioral/Psych: negative for abusive relationship, depression Endocrine: negative for temperature intolerance     Blood pressure 156/95, pulse 67, weight 208 lb (94.348 kg), last menstrual period 01/11/2015.  Physical Exam Physical Exam:  Deferred  100% of 15 min visit spent on counseling and coordination of care.   Data Reviewed Labs  Assessment     AUB  Severe asthma and cardiovascular disease    Plan    Need medical clearance to proceed with  procedure Reschedule Ablation with medical clearance  No orders of the defined types were placed in this encounter.   Meds ordered this encounter  Medications  . medroxyPROGESTERone (PROVERA) 10 MG tablet    Sig: Take 1 tablet (10 mg total) by mouth daily.    Dispense:  30 tablet    Refill:  0  . oxyCODONE-acetaminophen (PERCOCET) 10-325 MG tablet    Sig: Take 1 tablet by mouth every 4 (four) hours as needed for pain.    Dispense:  40 tablet    Refill:  0

## 2016-02-25 ENCOUNTER — Telehealth: Payer: Self-pay | Admitting: Obstetrics

## 2016-02-25 NOTE — Telephone Encounter (Signed)
Pt called stating that she's been taking the medicine as prescribed by Dr. [name], but she's still bleeding and in pain. Pt would like to know when her surgery will be scheduled. Please advise

## 2016-03-03 NOTE — Telephone Encounter (Signed)
Please review for scheduling

## 2016-03-10 ENCOUNTER — Other Ambulatory Visit: Payer: Self-pay | Admitting: Obstetrics

## 2016-03-10 DIAGNOSIS — N946 Dysmenorrhea, unspecified: Secondary | ICD-10-CM

## 2016-03-10 MED ORDER — OXYCODONE-ACETAMINOPHEN 10-325 MG PO TABS: 1.0000 | ORAL_TABLET | ORAL | Status: DC | PRN

## 2016-03-25 ENCOUNTER — Other Ambulatory Visit: Payer: Self-pay | Admitting: *Deleted

## 2016-03-25 ENCOUNTER — Emergency Department (HOSPITAL_COMMUNITY)
Admission: EM | Admit: 2016-03-25 | Discharge: 2016-03-25 | Disposition: A | Discharge status: Home / Self Care | Payer: Medicaid Other | Attending: Emergency Medicine | Admitting: Emergency Medicine

## 2016-03-25 ENCOUNTER — Encounter (HOSPITAL_COMMUNITY): Payer: Self-pay | Admitting: Emergency Medicine

## 2016-03-25 ENCOUNTER — Emergency Department (HOSPITAL_COMMUNITY): Payer: Medicaid Other

## 2016-03-25 DIAGNOSIS — E669 Obesity, unspecified: Secondary | ICD-10-CM | POA: Insufficient documentation

## 2016-03-25 DIAGNOSIS — Z91040 Latex allergy status: Secondary | ICD-10-CM | POA: Insufficient documentation

## 2016-03-25 DIAGNOSIS — Z8719 Personal history of other diseases of the digestive system: Secondary | ICD-10-CM | POA: Diagnosis not present

## 2016-03-25 DIAGNOSIS — Z9861 Coronary angioplasty status: Secondary | ICD-10-CM | POA: Diagnosis not present

## 2016-03-25 DIAGNOSIS — F419 Anxiety disorder, unspecified: Secondary | ICD-10-CM | POA: Diagnosis not present

## 2016-03-25 DIAGNOSIS — I10 Essential (primary) hypertension: Secondary | ICD-10-CM | POA: Diagnosis not present

## 2016-03-25 DIAGNOSIS — E785 Hyperlipidemia, unspecified: Secondary | ICD-10-CM | POA: Insufficient documentation

## 2016-03-25 DIAGNOSIS — Z7951 Long term (current) use of inhaled steroids: Secondary | ICD-10-CM | POA: Diagnosis not present

## 2016-03-25 DIAGNOSIS — Z7982 Long term (current) use of aspirin: Secondary | ICD-10-CM | POA: Diagnosis not present

## 2016-03-25 DIAGNOSIS — M79602 Pain in left arm: Secondary | ICD-10-CM | POA: Insufficient documentation

## 2016-03-25 DIAGNOSIS — F329 Major depressive disorder, single episode, unspecified: Secondary | ICD-10-CM | POA: Diagnosis not present

## 2016-03-25 DIAGNOSIS — I252 Old myocardial infarction: Secondary | ICD-10-CM | POA: Diagnosis not present

## 2016-03-25 DIAGNOSIS — M79601 Pain in right arm: Secondary | ICD-10-CM | POA: Insufficient documentation

## 2016-03-25 DIAGNOSIS — R0789 Other chest pain: Secondary | ICD-10-CM | POA: Insufficient documentation

## 2016-03-25 DIAGNOSIS — Z79899 Other long term (current) drug therapy: Secondary | ICD-10-CM | POA: Diagnosis not present

## 2016-03-25 DIAGNOSIS — M79605 Pain in left leg: Secondary | ICD-10-CM | POA: Insufficient documentation

## 2016-03-25 DIAGNOSIS — R1084 Generalized abdominal pain: Secondary | ICD-10-CM | POA: Insufficient documentation

## 2016-03-25 DIAGNOSIS — M79604 Pain in right leg: Secondary | ICD-10-CM | POA: Diagnosis not present

## 2016-03-25 DIAGNOSIS — J45909 Unspecified asthma, uncomplicated: Secondary | ICD-10-CM | POA: Insufficient documentation

## 2016-03-25 DIAGNOSIS — F17210 Nicotine dependence, cigarettes, uncomplicated: Secondary | ICD-10-CM | POA: Insufficient documentation

## 2016-03-25 DIAGNOSIS — I2510 Atherosclerotic heart disease of native coronary artery without angina pectoris: Secondary | ICD-10-CM | POA: Insufficient documentation

## 2016-03-25 DIAGNOSIS — Z9889 Other specified postprocedural states: Secondary | ICD-10-CM | POA: Diagnosis not present

## 2016-03-25 DIAGNOSIS — R079 Chest pain, unspecified: Secondary | ICD-10-CM | POA: Diagnosis present

## 2016-03-25 LAB — BASIC METABOLIC PANEL
ANION GAP: 10 (ref 5–15)
BUN: 13 mg/dL (ref 6–20)
CALCIUM: 9.1 mg/dL (ref 8.9–10.3)
CO2: 22 mmol/L (ref 22–32)
CREATININE: 0.57 mg/dL (ref 0.44–1.00)
Chloride: 104 mmol/L (ref 101–111)
Glucose, Bld: 123 mg/dL — ABNORMAL HIGH (ref 65–99)
Potassium: 3.4 mmol/L — ABNORMAL LOW (ref 3.5–5.1)
SODIUM: 136 mmol/L (ref 135–145)

## 2016-03-25 LAB — I-STAT TROPONIN, ED
TROPONIN I, POC: 0.01 ng/mL (ref 0.00–0.08)
TROPONIN I, POC: 0.05 ng/mL (ref 0.00–0.08)

## 2016-03-25 LAB — CBC
HCT: 39.7 % (ref 36.0–46.0)
Hemoglobin: 13.6 g/dL (ref 12.0–15.0)
MCH: 29 pg (ref 26.0–34.0)
MCHC: 34.3 g/dL (ref 30.0–36.0)
MCV: 84.6 fL (ref 78.0–100.0)
Platelets: 235 10*3/uL (ref 150–400)
RBC: 4.69 MIL/uL (ref 3.87–5.11)
RDW: 15.8 % — ABNORMAL HIGH (ref 11.5–15.5)
WBC: 8.5 10*3/uL (ref 4.0–10.5)

## 2016-03-25 NOTE — ED Notes (Addendum)
Per [facility]   PT coming from home 1 am CP pressure L side tender with palpation. Emsis x4. 1 after she wokwe up and 3 later on   Hx of MI and HTN Stents place 1 NTG 4 am. Relieved it but had worn off by the time EMS arrived. 324 ASA prior to EMS arrival. 2 NTG given. 10/10 pain decreased to 1/10 following EMS 2 NTG. 4 zofran given   20RAC   198/138

## 2016-03-25 NOTE — ED Notes (Signed)
Pt reports cp and n/v started this am states she has taken nitro , states her chest is sore and needs to have d and c is on hormone pills that make her nipples sore

## 2016-03-25 NOTE — Discharge Instructions (Signed)
Nonspecific Chest Pain  °Chest pain can be caused by many different conditions. There is always a chance that your pain could be related to something serious, such as a heart attack or a blood clot in your lungs. Chest pain can also be caused by conditions that are not life-threatening. If you have chest pain, it is very important to follow up with your health care provider. °CAUSES  °Chest pain can be caused by: °· Heartburn. °· Pneumonia or bronchitis. °· Anxiety or stress. °· Inflammation around your heart (pericarditis) or lung (pleuritis or pleurisy). °· A blood clot in your lung. °· A collapsed lung (pneumothorax). It can develop suddenly on its own (spontaneous pneumothorax) or from trauma to the chest. °· Shingles infection (varicella-zoster virus). °· Heart attack. °· Damage to the bones, muscles, and cartilage that make up your chest wall. This can include: °¨ Bruised bones due to injury. °¨ Strained muscles or cartilage due to frequent or repeated coughing or overwork. °¨ Fracture to one or more ribs. °¨ Sore cartilage due to inflammation (costochondritis). °RISK FACTORS  °Risk factors for chest pain may include: °· Activities that increase your risk for trauma or injury to your chest. °· Respiratory infections or conditions that cause frequent coughing. °· Medical conditions or overeating that can cause heartburn. °· Heart disease or family history of heart disease. °· Conditions or health behaviors that increase your risk of developing a blood clot. °· Having had chicken pox (varicella zoster). °SIGNS AND SYMPTOMS °Chest pain can feel like: °· Burning or tingling on the surface of your chest or deep in your chest. °· Crushing, pressure, aching, or squeezing pain. °· Dull or sharp pain that is worse when you move, cough, or take a deep breath. °· Pain that is also felt in your back, neck, shoulder, or arm, or pain that spreads to any of these areas. °Your chest pain may come and go, or it may stay  constant. °DIAGNOSIS °Lab tests or other studies may be needed to find the cause of your pain. Your health care provider may have you take a test called an ambulatory ECG (electrocardiogram). An ECG records your heartbeat patterns at the time the test is performed. You may also have other tests, such as: °· Transthoracic echocardiogram (TTE). During echocardiography, sound waves are used to create a picture of all of the heart structures and to look at how blood flows through your heart. °· Transesophageal echocardiogram (TEE). This is a more advanced imaging test that obtains images from inside your body. It allows your health care provider to see your heart in finer detail. °· Cardiac monitoring. This allows your health care provider to monitor your heart rate and rhythm in real time. °· Holter monitor. This is a portable device that records your heartbeat and can help to diagnose abnormal heartbeats. It allows your health care provider to track your heart activity for several days, if needed. °· Stress tests. These can be done through exercise or by taking medicine that makes your heart beat more quickly. °· Blood tests. °· Imaging tests. °TREATMENT  °Your treatment depends on what is causing your chest pain. Treatment may include: °· Medicines. These may include: °¨ Acid blockers for heartburn. °¨ Anti-inflammatory medicine. °¨ Pain medicine for inflammatory conditions. °¨ Antibiotic medicine, if an infection is present. °¨ Medicines to dissolve blood clots. °¨ Medicines to treat coronary artery disease. °· Supportive care for conditions that do not require medicines. This may include: °¨ Resting. °¨ Applying heat   or cold packs to injured areas. °¨ Limiting activities until pain decreases. °HOME CARE INSTRUCTIONS °· If you were prescribed an antibiotic medicine, finish it all even if you start to feel better. °· Avoid any activities that bring on chest pain. °· Do not use any tobacco products, including  cigarettes, chewing tobacco, or electronic cigarettes. If you need help quitting, ask your health care provider. °· Do not drink alcohol. °· Take medicines only as directed by your health care provider. °· Keep all follow-up visits as directed by your health care provider. This is important. This includes any further testing if your chest pain does not go away. °· If heartburn is the cause for your chest pain, you may be told to keep your head raised (elevated) while sleeping. This reduces the chance that acid will go from your stomach into your esophagus. °· Make lifestyle changes as directed by your health care provider. These may include: °¨ Getting regular exercise. Ask your health care provider to suggest some activities that are safe for you. °¨ Eating a heart-healthy diet. A registered dietitian can help you to learn healthy eating options. °¨ Maintaining a healthy weight. °¨ Managing diabetes, if necessary. °¨ Reducing stress. °SEEK MEDICAL CARE IF: °· Your chest pain does not go away after treatment. °· You have a rash with blisters on your chest. °· You have a fever. °SEEK IMMEDIATE MEDICAL CARE IF:  °· Your chest pain is worse. °· You have an increasing cough, or you cough up blood. °· You have severe abdominal pain. °· You have severe weakness. °· You faint. °· You have chills. °· You have sudden, unexplained chest discomfort. °· You have sudden, unexplained discomfort in your arms, back, neck, or jaw. °· You have shortness of breath at any time. °· You suddenly start to sweat, or your skin gets clammy. °· You feel nauseous or you vomit. °· You suddenly feel light-headed or dizzy. °· Your heart begins to beat quickly, or it feels like it is skipping beats. °These symptoms may represent a serious problem that is an emergency. Do not wait to see if the symptoms will go away. Get medical help right away. Call your local emergency services (911 in the [place] Do not drive yourself to the hospital. °  °This  information is not intended to replace advice given to you by your health care provider. Make sure you discuss any questions you have with your health care provider. °  °Document Released: 08/19/2005 Document Revised: 11/30/2014 Document Reviewed: 06/15/2014 °Elsevier Interactive Patient Education ©2016 [employer] ° °
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
## 2016-03-27 NOTE — Patient Instructions (Addendum)
Your procedure is scheduled on:  Friday, Apr 03, 2016  Enter through the Main Entrance of [facility] at:  10:30 AM  Pick up the phone at the desk and dial [phone].  Call this number if you have problems the morning of surgery: [phone].  Remember: Do NOT eat food:  After Midnight Thursday  Do NOT drink clear liquids after: 8:00 AM day of surgery  Take these medicines the morning of surgery with a SIP OF WATER:  Norvasc, Lisinopril, Lipitor, Zoloft  Bring Asthma inhaler day of surgery.  Do NOT wear jewelry (body piercing), metal hair clips/bobby pins, make-up, or nail polish. Do NOT wear lotions, powders, or perfumes.  You may wear deodorant. Do NOT shave for 48 hours prior to surgery. Do NOT bring valuables to the hospital. Contacts, dentures, or bridgework may not be worn into surgery.  Have a responsible adult drive you home and stay with you for 24 hours after your procedure

## 2016-03-30 ENCOUNTER — Encounter (HOSPITAL_COMMUNITY): Payer: Self-pay

## 2016-03-30 ENCOUNTER — Encounter (HOSPITAL_COMMUNITY)
Admission: RE | Admit: 2016-03-30 | Discharge: 2016-03-30 | Disposition: A | Discharge status: Home / Self Care | Payer: Medicaid Other | Source: Ambulatory Visit | Attending: Obstetrics | Admitting: Obstetrics

## 2016-03-30 DIAGNOSIS — J45909 Unspecified asthma, uncomplicated: Secondary | ICD-10-CM | POA: Insufficient documentation

## 2016-03-30 DIAGNOSIS — F17200 Nicotine dependence, unspecified, uncomplicated: Secondary | ICD-10-CM | POA: Diagnosis not present

## 2016-03-30 DIAGNOSIS — E785 Hyperlipidemia, unspecified: Secondary | ICD-10-CM | POA: Insufficient documentation

## 2016-03-30 DIAGNOSIS — F419 Anxiety disorder, unspecified: Secondary | ICD-10-CM | POA: Diagnosis not present

## 2016-03-30 DIAGNOSIS — G4733 Obstructive sleep apnea (adult) (pediatric): Secondary | ICD-10-CM | POA: Diagnosis not present

## 2016-03-30 DIAGNOSIS — I2510 Atherosclerotic heart disease of native coronary artery without angina pectoris: Secondary | ICD-10-CM | POA: Diagnosis not present

## 2016-03-30 DIAGNOSIS — I252 Old myocardial infarction: Secondary | ICD-10-CM | POA: Diagnosis not present

## 2016-03-30 DIAGNOSIS — Z01812 Encounter for preprocedural laboratory examination: Secondary | ICD-10-CM | POA: Diagnosis not present

## 2016-03-30 DIAGNOSIS — N939 Abnormal uterine and vaginal bleeding, unspecified: Secondary | ICD-10-CM | POA: Insufficient documentation

## 2016-03-30 DIAGNOSIS — F329 Major depressive disorder, single episode, unspecified: Secondary | ICD-10-CM | POA: Insufficient documentation

## 2016-03-30 DIAGNOSIS — I10 Essential (primary) hypertension: Secondary | ICD-10-CM | POA: Insufficient documentation

## 2016-03-30 DIAGNOSIS — K219 Gastro-esophageal reflux disease without esophagitis: Secondary | ICD-10-CM | POA: Insufficient documentation

## 2016-03-30 HISTORY — DX: Unspecified osteoarthritis, unspecified site: M19.90

## 2016-03-30 HISTORY — DX: Sleep apnea, unspecified: G47.30

## 2016-03-30 NOTE — Pre-Procedure Instructions (Signed)
Dr. Delma Post made aware of [name]'s CBC and BMP results from 03/25/16, we do not need to draw any additional labs prior to her surgery on Friday, Apr 03, 2016.

## 2016-04-02 MED ORDER — SODIUM CHLORIDE 0.9 % IV SOLN
3.0000 g | INTRAVENOUS | Status: AC
  Administered 2016-04-03: 3 g via INTRAVENOUS
  Filled 2016-04-02: qty 3

## 2016-04-03 ENCOUNTER — Ambulatory Visit (HOSPITAL_COMMUNITY): Payer: Medicaid Other | Admitting: Anesthesiology

## 2016-04-03 ENCOUNTER — Encounter (HOSPITAL_COMMUNITY): Payer: Self-pay | Admitting: Registered Nurse

## 2016-04-03 ENCOUNTER — Encounter (HOSPITAL_COMMUNITY)
Admission: RE | Disposition: A | Discharge status: Home / Self Care | Payer: Self-pay | Source: Ambulatory Visit | Attending: Obstetrics

## 2016-04-03 ENCOUNTER — Ambulatory Visit (HOSPITAL_COMMUNITY)
Admission: RE | Admit: 2016-04-03 | Discharge: 2016-04-04 | Disposition: A | Discharge status: Home / Self Care | Payer: Medicaid Other | Source: Ambulatory Visit | Attending: Obstetrics | Admitting: Obstetrics

## 2016-04-03 DIAGNOSIS — E785 Hyperlipidemia, unspecified: Secondary | ICD-10-CM | POA: Diagnosis not present

## 2016-04-03 DIAGNOSIS — I252 Old myocardial infarction: Secondary | ICD-10-CM | POA: Insufficient documentation

## 2016-04-03 DIAGNOSIS — Z955 Presence of coronary angioplasty implant and graft: Secondary | ICD-10-CM | POA: Insufficient documentation

## 2016-04-03 DIAGNOSIS — I2510 Atherosclerotic heart disease of native coronary artery without angina pectoris: Secondary | ICD-10-CM | POA: Diagnosis not present

## 2016-04-03 DIAGNOSIS — Z885 Allergy status to narcotic agent status: Secondary | ICD-10-CM | POA: Insufficient documentation

## 2016-04-03 DIAGNOSIS — N939 Abnormal uterine and vaginal bleeding, unspecified: Secondary | ICD-10-CM

## 2016-04-03 DIAGNOSIS — K219 Gastro-esophageal reflux disease without esophagitis: Secondary | ICD-10-CM | POA: Diagnosis not present

## 2016-04-03 DIAGNOSIS — M1990 Unspecified osteoarthritis, unspecified site: Secondary | ICD-10-CM | POA: Diagnosis not present

## 2016-04-03 DIAGNOSIS — Z7902 Long term (current) use of antithrombotics/antiplatelets: Secondary | ICD-10-CM | POA: Diagnosis not present

## 2016-04-03 DIAGNOSIS — F329 Major depressive disorder, single episode, unspecified: Secondary | ICD-10-CM | POA: Diagnosis not present

## 2016-04-03 DIAGNOSIS — Z91018 Allergy to other foods: Secondary | ICD-10-CM | POA: Insufficient documentation

## 2016-04-03 DIAGNOSIS — F17210 Nicotine dependence, cigarettes, uncomplicated: Secondary | ICD-10-CM | POA: Insufficient documentation

## 2016-04-03 DIAGNOSIS — Z880 Allergy status to penicillin: Secondary | ICD-10-CM | POA: Diagnosis not present

## 2016-04-03 DIAGNOSIS — G4730 Sleep apnea, unspecified: Secondary | ICD-10-CM | POA: Diagnosis not present

## 2016-04-03 DIAGNOSIS — J45909 Unspecified asthma, uncomplicated: Secondary | ICD-10-CM | POA: Insufficient documentation

## 2016-04-03 DIAGNOSIS — Z6838 Body mass index (BMI) 38.0-38.9, adult: Secondary | ICD-10-CM | POA: Insufficient documentation

## 2016-04-03 DIAGNOSIS — F419 Anxiety disorder, unspecified: Secondary | ICD-10-CM | POA: Insufficient documentation

## 2016-04-03 DIAGNOSIS — I10 Essential (primary) hypertension: Secondary | ICD-10-CM | POA: Diagnosis not present

## 2016-04-03 DIAGNOSIS — Z91040 Latex allergy status: Secondary | ICD-10-CM | POA: Insufficient documentation

## 2016-04-03 DIAGNOSIS — M358 Other specified systemic involvement of connective tissue: Secondary | ICD-10-CM | POA: Insufficient documentation

## 2016-04-03 DIAGNOSIS — Z7982 Long term (current) use of aspirin: Secondary | ICD-10-CM | POA: Diagnosis not present

## 2016-04-03 HISTORY — PX: DILITATION & CURRETTAGE/HYSTROSCOPY WITH HYDROTHERMAL ABLATION: SHX5570

## 2016-04-03 LAB — PREGNANCY, URINE: Preg Test, Ur: NEGATIVE

## 2016-04-03 LAB — GLUCOSE, CAPILLARY: Glucose-Capillary: 122 mg/dL — ABNORMAL HIGH (ref 65–99)

## 2016-04-03 SURGERY — DILATATION & CURETTAGE/HYSTEROSCOPY WITH HYDROTHERMAL ABLATION
Anesthesia: General

## 2016-04-03 MED ORDER — LACTATED RINGERS IV SOLN
INTRAVENOUS | Status: DC
  Administered 2016-04-03: 10:00:00 via INTRAVENOUS

## 2016-04-03 MED ORDER — MORPHINE SULFATE (PF) 4 MG/ML IV SOLN
INTRAVENOUS | Status: AC
  Filled 2016-04-03: qty 1

## 2016-04-03 MED ORDER — ONDANSETRON HCL 4 MG/2ML IJ SOLN
INTRAMUSCULAR | Status: AC
  Filled 2016-04-03: qty 2

## 2016-04-03 MED ORDER — FENTANYL CITRATE (PF) 100 MCG/2ML IJ SOLN
INTRAMUSCULAR | Status: DC | PRN
  Administered 2016-04-03: 25 ug via INTRAVENOUS
  Administered 2016-04-03: 50 ug via INTRAVENOUS
  Administered 2016-04-03: 25 ug via INTRAVENOUS

## 2016-04-03 MED ORDER — LISINOPRIL-HYDROCHLOROTHIAZIDE 20-12.5 MG PO TABS: 2.0000 | ORAL_TABLET | Freq: Every day | ORAL | Status: DC

## 2016-04-03 MED ORDER — LIDOCAINE HCL 1 % IJ SOLN
INTRAMUSCULAR | Status: DC | PRN
  Administered 2016-04-03: 20 mL

## 2016-04-03 MED ORDER — PROMETHAZINE HCL 25 MG/ML IJ SOLN
6.2500 mg | Freq: Once | INTRAMUSCULAR | Status: AC
  Administered 2016-04-03: 6.25 mg via INTRAVENOUS

## 2016-04-03 MED ORDER — OXYCODONE-ACETAMINOPHEN 10-325 MG PO TABS: 1.0000 | ORAL_TABLET | ORAL | Status: DC | PRN

## 2016-04-03 MED ORDER — OXYCODONE-ACETAMINOPHEN 5-325 MG PO TABS
ORAL_TABLET | ORAL | Status: AC
  Filled 2016-04-03: qty 1

## 2016-04-03 MED ORDER — AMLODIPINE BESYLATE 10 MG PO TABS
10.0000 mg | ORAL_TABLET | Freq: Every day | ORAL | Status: DC
  Administered 2016-04-04: 10 mg via ORAL
  Filled 2016-04-03: qty 1

## 2016-04-03 MED ORDER — ATORVASTATIN CALCIUM 40 MG PO TABS: 40.0000 mg | ORAL_TABLET | Freq: Every day | ORAL | Status: DC

## 2016-04-03 MED ORDER — FENTANYL CITRATE (PF) 100 MCG/2ML IJ SOLN
INTRAMUSCULAR | Status: AC
  Filled 2016-04-03: qty 2

## 2016-04-03 MED ORDER — MIDAZOLAM HCL 2 MG/2ML IJ SOLN
INTRAMUSCULAR | Status: AC
  Filled 2016-04-03: qty 2

## 2016-04-03 MED ORDER — PROMETHAZINE HCL 25 MG/ML IJ SOLN
INTRAMUSCULAR | Status: AC
  Administered 2016-04-03: 6.25 mg via INTRAVENOUS
  Filled 2016-04-03: qty 1

## 2016-04-03 MED ORDER — FENTANYL CITRATE (PF) 100 MCG/2ML IJ SOLN
25.0000 ug | INTRAMUSCULAR | Status: DC | PRN
  Administered 2016-04-03 (×3): 50 ug via INTRAVENOUS

## 2016-04-03 MED ORDER — ZOLPIDEM TARTRATE 5 MG PO TABS
5.0000 mg | ORAL_TABLET | Freq: Every evening | ORAL | Status: DC | PRN
  Administered 2016-04-03: 5 mg via ORAL
  Filled 2016-04-03: qty 1

## 2016-04-03 MED ORDER — LABETALOL HCL 5 MG/ML IV SOLN
INTRAVENOUS | Status: AC
  Administered 2016-04-03: 10 mg via INTRAVENOUS
  Filled 2016-04-03: qty 4

## 2016-04-03 MED ORDER — MIDAZOLAM HCL 5 MG/5ML IJ SOLN
INTRAMUSCULAR | Status: DC | PRN
  Administered 2016-04-03: 2 mg via INTRAVENOUS

## 2016-04-03 MED ORDER — DEXAMETHASONE SODIUM PHOSPHATE 4 MG/ML IJ SOLN
INTRAMUSCULAR | Status: DC | PRN
  Administered 2016-04-03: 4 mg via INTRAVENOUS

## 2016-04-03 MED ORDER — ALBUTEROL SULFATE (2.5 MG/3ML) 0.083% IN NEBU: 3.0000 mL | INHALATION_SOLUTION | Freq: Four times a day (QID) | RESPIRATORY_TRACT | Status: DC | PRN

## 2016-04-03 MED ORDER — MORPHINE SULFATE (PF) 4 MG/ML IV SOLN
INTRAVENOUS | Status: AC
  Administered 2016-04-03: 1 mg via INTRAVENOUS
  Filled 2016-04-03: qty 1

## 2016-04-03 MED ORDER — LISINOPRIL 40 MG PO TABS
40.0000 mg | ORAL_TABLET | Freq: Every day | ORAL | Status: DC
  Administered 2016-04-04: 40 mg via ORAL
  Filled 2016-04-03: qty 1

## 2016-04-03 MED ORDER — LACTATED RINGERS IV SOLN
INTRAVENOUS | Status: DC
  Administered 2016-04-03: 17:00:00 via INTRAVENOUS

## 2016-04-03 MED ORDER — OXYCODONE-ACETAMINOPHEN 5-325 MG PO TABS: 1.0000 | ORAL_TABLET | ORAL | Status: DC | PRN

## 2016-04-03 MED ORDER — PROPOFOL 10 MG/ML IV BOLUS
INTRAVENOUS | Status: AC
  Filled 2016-04-03: qty 20

## 2016-04-03 MED ORDER — SCOPOLAMINE 1 MG/3DAYS TD PT72
MEDICATED_PATCH | TRANSDERMAL | Status: AC
  Administered 2016-04-03: 1.5 mg via TRANSDERMAL
  Filled 2016-04-03: qty 1

## 2016-04-03 MED ORDER — METOCLOPRAMIDE HCL 5 MG/ML IJ SOLN
10.0000 mg | Freq: Once | INTRAMUSCULAR | Status: AC | PRN
Start: 2016-04-03 — End: 2016-04-03
  Administered 2016-04-03: 10 mg via INTRAVENOUS

## 2016-04-03 MED ORDER — LABETALOL HCL 5 MG/ML IV SOLN
10.0000 mg | INTRAVENOUS | Status: DC | PRN
  Administered 2016-04-03 (×4): 10 mg via INTRAVENOUS

## 2016-04-03 MED ORDER — SCOPOLAMINE 1 MG/3DAYS TD PT72: 1.0000 | MEDICATED_PATCH | Freq: Once | TRANSDERMAL | Status: DC

## 2016-04-03 MED ORDER — LACTATED RINGERS IV SOLN
INTRAVENOUS | Status: DC
  Administered 2016-04-03 – 2016-04-04 (×2): via INTRAVENOUS

## 2016-04-03 MED ORDER — SERTRALINE HCL 50 MG PO TABS
50.0000 mg | ORAL_TABLET | Freq: Every day | ORAL | Status: DC
  Administered 2016-04-04: 50 mg via ORAL
  Filled 2016-04-03: qty 1

## 2016-04-03 MED ORDER — HYDRALAZINE HCL 20 MG/ML IJ SOLN
5.0000 mg | Freq: Once | INTRAMUSCULAR | Status: AC
  Administered 2016-04-03: 5 mg via INTRAVENOUS

## 2016-04-03 MED ORDER — MEPERIDINE HCL 25 MG/ML IJ SOLN: 6.2500 mg | INTRAMUSCULAR | Status: DC | PRN

## 2016-04-03 MED ORDER — HYDROCHLOROTHIAZIDE 25 MG PO TABS
25.0000 mg | ORAL_TABLET | Freq: Every day | ORAL | Status: DC
  Administered 2016-04-04 (×2): 25 mg via ORAL
  Filled 2016-04-03: qty 1

## 2016-04-03 MED ORDER — LIDOCAINE HCL 1 % IJ SOLN
INTRAMUSCULAR | Status: AC
  Filled 2016-04-03: qty 20

## 2016-04-03 MED ORDER — SCOPOLAMINE 1 MG/3DAYS TD PT72
1.0000 | MEDICATED_PATCH | TRANSDERMAL | Status: DC
  Administered 2016-04-03: 1.5 mg via TRANSDERMAL

## 2016-04-03 MED ORDER — ONDANSETRON HCL 4 MG/2ML IJ SOLN
INTRAMUSCULAR | Status: DC | PRN
  Administered 2016-04-03: 4 mg via INTRAVENOUS

## 2016-04-03 MED ORDER — METOCLOPRAMIDE HCL 5 MG/ML IJ SOLN
INTRAMUSCULAR | Status: AC
  Filled 2016-04-03: qty 2

## 2016-04-03 MED ORDER — KETOROLAC TROMETHAMINE 30 MG/ML IJ SOLN
INTRAMUSCULAR | Status: AC
  Filled 2016-04-03: qty 1

## 2016-04-03 MED ORDER — IBUPROFEN 800 MG PO TABS: 800.0000 mg | ORAL_TABLET | Freq: Three times a day (TID) | ORAL | Status: DC | PRN

## 2016-04-03 MED ORDER — MORPHINE SULFATE (PF) 4 MG/ML IV SOLN
1.0000 mg | INTRAVENOUS | Status: DC | PRN
  Administered 2016-04-03 (×2): 1 mg via INTRAVENOUS
  Administered 2016-04-03 (×3): 2 mg via INTRAVENOUS

## 2016-04-03 MED ORDER — LIDOCAINE HCL (CARDIAC) 20 MG/ML IV SOLN
INTRAVENOUS | Status: AC
  Filled 2016-04-03: qty 5

## 2016-04-03 MED ORDER — OXYCODONE HCL 5 MG PO TABS
5.0000 mg | ORAL_TABLET | ORAL | Status: DC | PRN
  Administered 2016-04-03: 5 mg via ORAL
  Filled 2016-04-03: qty 1

## 2016-04-03 MED ORDER — HYDRALAZINE HCL 20 MG/ML IJ SOLN
INTRAMUSCULAR | Status: AC
Start: 2016-04-03 — End: 2016-04-03
  Administered 2016-04-03: 5 mg via INTRAVENOUS
  Filled 2016-04-03: qty 1

## 2016-04-03 MED ORDER — DEXAMETHASONE SODIUM PHOSPHATE 4 MG/ML IJ SOLN
INTRAMUSCULAR | Status: AC
  Filled 2016-04-03: qty 1

## 2016-04-03 MED ORDER — KETOROLAC TROMETHAMINE 30 MG/ML IJ SOLN
INTRAMUSCULAR | Status: DC | PRN
  Administered 2016-04-03: 30 mg via INTRAVENOUS

## 2016-04-03 MED ORDER — PROPOFOL 10 MG/ML IV BOLUS
INTRAVENOUS | Status: DC | PRN
  Administered 2016-04-03: 170 mg via INTRAVENOUS
  Administered 2016-04-03: 30 mg via INTRAVENOUS

## 2016-04-03 MED ORDER — OXYCODONE-ACETAMINOPHEN 5-325 MG PO TABS
1.0000 | ORAL_TABLET | ORAL | Status: DC | PRN
  Administered 2016-04-03: 1 via ORAL
  Filled 2016-04-03: qty 1

## 2016-04-03 MED ORDER — LIDOCAINE HCL (CARDIAC) 20 MG/ML IV SOLN
INTRAVENOUS | Status: DC | PRN
  Administered 2016-04-03: 100 mg via INTRAVENOUS

## 2016-04-03 SURGICAL SUPPLY — 16 items
CATH ROBINSON RED A/P 16FR (CATHETERS) IMPLANT
CLOTH BEACON ORANGE TIMEOUT ST (SAFETY) ×3 IMPLANT
CONTAINER PREFILL 10% NBF 60ML (FORM) ×6 IMPLANT
ELECT REM PT RETURN 9FT ADLT (ELECTROSURGICAL)
ELECTRODE REM PT RTRN 9FT ADLT (ELECTROSURGICAL) IMPLANT
GLOVE BIO SURGEON STRL SZ8 (GLOVE) ×6 IMPLANT
GLOVE BIOGEL PI IND STRL 7.0 (GLOVE) ×1 IMPLANT
GLOVE BIOGEL PI INDICATOR 7.0 (GLOVE) ×2
GOWN STRL REUS W/TWL LRG LVL3 (GOWN DISPOSABLE) ×6 IMPLANT
NEEDLE HYPO 22GX1.5 SAFETY (NEEDLE) ×3 IMPLANT
NS IRRIG 1000ML POUR BTL (IV SOLUTION) ×3 IMPLANT
PACK VAGINAL MINOR WOMEN LF (CUSTOM PROCEDURE TRAY) ×3 IMPLANT
PAD OB MATERNITY 4.3X12.25 (PERSONAL CARE ITEMS) ×3 IMPLANT
SET GENESYS HTA PROCERVA (MISCELLANEOUS) ×2 IMPLANT
TOWEL OR 17X24 6PK STRL BLUE (TOWEL DISPOSABLE) ×6 IMPLANT
WATER STERILE IRR 1000ML POUR (IV SOLUTION) ×3 IMPLANT

## 2016-04-03 NOTE — Anesthesia Postprocedure Evaluation (Signed)
Anesthesia Post Note  Patient: Tracey Manning  Procedure(s) Performed: Procedure(s) (LRB): DILATATION & CURETTAGE/HYSTEROSCOPY WITH HYDROTHERMAL ABLATION (N/A)  Patient location during evaluation: PACU Anesthesia Type: General Level of consciousness: awake and alert Vital Signs Assessment: post-procedure vital signs reviewed and stable Respiratory status: spontaneous breathing, nonlabored ventilation, respiratory function stable and patient connected to nasal cannula oxygen Cardiovascular status: stable Anesthetic complications: no     Last Vitals:  Filed Vitals:   04/03/16 1700 04/03/16 1715  BP: 197/114 196/107  Pulse: 66 73  Temp:  36.2 C  Resp: 18 18    Last Pain:  Filed Vitals:   04/03/16 1739  PainSc: 8    Pain Goal: Patients Stated Pain Goal: 4 (04/03/16 1715)               Montez Hageman

## 2016-04-03 NOTE — H&P (Signed)
Tracey Manning is an 55 y.o. female. AUB.  Pertinent Gynecological History: Menses: flow is moderate Bleeding: dysfunctional uterine bleeding Contraception: none DES exposure: denies Blood transfusions: none Sexually transmitted diseases: no past history Previous GYN Procedures: DNC  Last mammogram: normal Date: 2016 Last pap: normal Date: 2016   Menstrual History: Menarche age: 89  No LMP recorded. Patient is not currently having periods (Reason: Perimenopausal).    Past Medical History  Diagnosis Date  . Coronary artery disease   . Sciatica   . Hypertension   . Asthma   . Dyslipidemia   . Tobacco abuse   . Chest pain   . Collagen vascular disease ([place])   . Anxiety   . Depression   . Myocardial infarct ([place]) [id number]  . Sleep apnea     no CPAP ordered but using oxygen at bedtime  . GERD (gastroesophageal reflux disease)     occ  . Arthritis     Past Surgical History  Procedure Laterality Date  . Cesarean section    . Dilation and curettage, diagnostic / therapeutic    . Nm myocar perf wall motion  08/2009    persantine myoview - normal perfusion in all regions, perfusion defect in anterior region (breast attenuation), EF 52%, low risk scan  . Cardiac catheterization  10/09/2003    [place], [place]) - LAD with 30% prox narrowing, 50% stenosis in mid-portion of PLA; RCA with 40% narrowing proximally ([name], [name])  . Cardiac catheterization  10/10/2007    70% stenosis in first septal perforator branch of LAD, 60-70% narrowing in mid LAD, 20% narrowing in mid AV groove Cfx with 80% diffuse narrowing in small distal marginal, total occlusion of mid RCA with L to R collaterals, 90% stenosis diffusely in prox branch of RCA followed by 70% stenosis in secondary curve & 80% stenosis in small marginal branch ([name])  . Coronary angioplasty with stent placement  10/31/2007    PCI of distal Cfx with 2.25x63mm Taxus Adam DES, 60% narrowing of mid  LAD ([name])  . Cardiac catheterization  05/28/2008    normal L main, RCA with 100% prox lesion w/distal filling from LAD collaterals, LAD with 20% prox tubular lesion/ 40% mid LAD lesion/previous stent patent ([name])  . Cardiac catheterization  09/15/2009    discrete 100% osital RCA lesion, 50% prox LAD lesion, non-obstructive disease in all coronaries ([name])  . Coronary angioplasty with stent placement  06/11/2011    PCI of prox-mid LAD with 3x78mm DES Resolute ([name])  . Colonoscopy    . Colonoscopy w/ polypectomy      Family History  Problem Relation Age of Onset  . Leukemia Mother   . Clotting disorder Father     blood clot  . Hypertension Sister   . Diabetes Sister   . Stroke Sister 69  . Bleeding Disorder Son     ITP "free bleeding disorder"    Social History:  reports that she has been smoking Cigarettes.  She has a 10 pack-year smoking history. She has never used smokeless tobacco. She reports that she drinks alcohol. She reports that she uses illicit drugs (Marijuana).  Allergies:  Allergies  Allergen Reactions  . Other Anaphylaxis and Hives    Fresh strawberries (throat swelled)  . Amoxicillin Nausea And Vomiting  . Dilaudid [Hydromorphone Hcl] Nausea And Vomiting  . Latex Swelling    No reaction with bandaids    Prescriptions  prior to admission  Medication Sig Dispense Refill Last Dose  . albuterol (PROVENTIL HFA;VENTOLIN HFA) 108 (90 BASE) MCG/ACT inhaler Inhale 2 puffs into the lungs every 6 (six) hours as needed for wheezing or shortness of breath.   Past Month at Unknown time  . amLODipine (NORVASC) 10 MG tablet Take 10 mg by mouth daily.   04/03/2016 at 0700  . aspirin EC 325 MG tablet Take 325 mg by mouth daily.   04/03/2016 at 0700  . atorvastatin (LIPITOR) 40 MG tablet Take 1 tablet (40 mg total) by mouth daily. 30 tablet 2 04/03/2016 at 0700  . lisinopril-hydrochlorothiazide (PRINZIDE,ZESTORETIC) 20-12.5 MG per tablet Take 2  tablets by mouth daily.   04/03/2016 at 0700  . medroxyPROGESTERone (PROVERA) 10 MG tablet Take 1 tablet (10 mg total) by mouth daily. 30 tablet 0 04/03/2016 at 0700  . nitroGLYCERIN (NITROSTAT) 0.4 MG SL tablet Place 1 tablet (0.4 mg total) under the tongue every 5 (five) minutes as needed for chest pain. 25 tablet 6 Past Week at Unknown time  . oxyCODONE-acetaminophen (PERCOCET) 10-325 MG tablet Take 1 tablet by mouth every 4 (four) hours as needed for pain. 40 tablet 0 Past Week at Unknown time  . sertraline (ZOLOFT) 50 MG tablet Take 50 mg by mouth daily.   04/03/2016 at 0700  . zolpidem (AMBIEN) 10 MG tablet Take 10 mg by mouth at bedtime as needed for sleep.   04/02/2016 at 2200  . clopidogrel (PLAVIX) 75 MG tablet Take 1 tablet (75 mg total) by mouth daily with breakfast. 30 tablet 2 03/27/2016 at 0800  . fluticasone-salmeterol (ADVAIR HFA) 115-21 MCG/ACT inhaler Inhale 2 puffs into the lungs 2 (two) times daily. 1 Inhaler 12 More than a month at Unknown time  . ibuprofen (ADVIL,MOTRIN) 800 MG tablet Take 1 tablet (800 mg total) by mouth every 8 (eight) hours as needed for mild pain or moderate pain. (Patient not taking: Reported on 03/26/2016) 30 tablet 5 Not Taking at Unknown time    Review of Systems  All other systems reviewed and are negative.   Blood pressure 154/90, pulse 70, temperature 97.8 F (36.6 C), temperature source Oral, resp. rate 20, SpO2 100 %. Physical Exam  Nursing note and vitals reviewed. Constitutional: She is oriented to person, place, and time. She appears well-developed and well-nourished.  HENT:  Head: Normocephalic and atraumatic.  Eyes: Conjunctivae are normal. Pupils are equal, round, and reactive to light.  Neck: Normal range of motion. Neck supple.  Cardiovascular: Normal rate and regular rhythm.   Respiratory: Effort normal and breath sounds normal.  GI: Soft.  Musculoskeletal: Normal range of motion.  Neurological: She is alert and oriented to person,  place, and time.  Skin: Skin is warm and dry.  Psychiatric: She has a normal mood and affect. Her behavior is normal. Judgment and thought content normal.    Results for orders placed or performed during the hospital encounter of 04/03/16 (from the past 24 hour(s))  Pregnancy, urine     Status: None   Collection Time: 04/03/16 10:00 AM  Result Value Ref Range   Preg Test, Ur NEGATIVE NEGATIVE    No results found.  Assessment/Plan: AUB.  For HTA Endometrial Ablation.  HARPER,CHARLES A 04/03/2016, 12:58 PM

## 2016-04-03 NOTE — Op Note (Signed)
Preop Diagnosis: AUB   Postop Diagnosis: Abnormal Uterine Bleeding   Procedure: HYSTEROSCOPY WITH HYDRO THERMAL ABLATION  Anesthesia: Choice   Anesthesiologist: No responsible provider has been recorded for the case.   Attending: [name], MD   Assistant:  Surgical Technician  Findings:  Normal uterine cavity  Pathology:  None  Fluids: per anesthesia  UOP:  Per anesthesia  EBL:  Per anesthesia  Complications:  None  Procedure: The patient was taken to the operating room after risks benefits and alternatives were discussed with patient, the patient verbalized understanding and consent signed and witnessed. The patient was placed under general anesthesia and prepped and draped in normal sterile fashion. A bivalve speculum was placed in the patient's vagina and the anterior lip of the cervix was grasped with a single-tooth tenaculum. The cervix was dilated for passage of the hysteroscope. The uterus sounded to 7.  The hysteroscope was introduced into the uterine cavity with findings as noted above. A curettage was performed and currettings sent to pathology. The hysteroscope was reintroduced and hydrothermal ablation was performed without difficulty. The tenaculum and bivalve speculum were removed and there was good hemostasis at the tenaculum sites. Sponge lap and needle count was correct. The patient tolerated procedure well and was returned to the recovery room in good condition.

## 2016-04-03 NOTE — Addendum Note (Signed)
Addendum  created 04/03/16 1740 by Montez Hageman, MD   Modules edited: Clinical Notes, Notes Section   Clinical Notes:  File: [id number]   Notes Section:  Delete: [id number]

## 2016-04-03 NOTE — Anesthesia Procedure Notes (Signed)
Procedure Name: LMA Insertion Date/Time: 04/03/2016 1:13 PM Performed by: Talbot Grumbling Pre-anesthesia Checklist: Patient identified, Emergency Drugs available, Suction available and Patient being monitored Patient Re-evaluated:Patient Re-evaluated prior to inductionOxygen Delivery Method: Circle system utilized Preoxygenation: Pre-oxygenation with 100% oxygen Intubation Type: IV induction Ventilation: Mask ventilation without difficulty LMA: LMA inserted LMA Size: 4.0 Number of attempts: 1 Placement Confirmation: positive ETCO2 and breath sounds checked- equal and bilateral Tube secured with: Tape Dental Injury: Teeth and Oropharynx as per pre-operative assessment

## 2016-04-03 NOTE — Anesthesia Preprocedure Evaluation (Addendum)
Anesthesia Evaluation  Patient identified by MRN, date of birth, ID band Patient awake    Reviewed: Allergy & Precautions, NPO status , Patient's Chart, lab work & pertinent test results  Airway Mallampati: II  TM Distance: >3 FB Neck ROM: Full    Dental no notable dental hx.    Pulmonary asthma , Current Smoker,    Pulmonary exam normal breath sounds clear to auscultation       Cardiovascular hypertension, Pt. on medications + CAD, + Past MI and + Cardiac Stents  Normal cardiovascular exam Rhythm:Regular Rate:Normal  Recent ER visit for chest pain. Work-up negative. Enzymes negative. Attrituted to stress anxiety   Neuro/Psych Anxiety Depression negative neurological ROS  negative psych ROS   GI/Hepatic Neg liver ROS, GERD  Controlled,  Endo/Other  Morbid obesity  Renal/GU negative Renal ROS  negative genitourinary   Musculoskeletal negative musculoskeletal ROS (+)   Abdominal   Peds negative pediatric ROS (+)  Hematology negative hematology ROS (+)   Anesthesia Other Findings   Reproductive/Obstetrics negative OB ROS                            Anesthesia Physical Anesthesia Plan  ASA: [name]  Anesthesia Plan: General   Post-op Pain Management:    Induction: Intravenous  Airway Management Planned: LMA  Additional Equipment:   Intra-op Plan:   Post-operative Plan: Extubation in OR  Informed Consent: I have reviewed the patients History and Physical, chart, labs and discussed the procedure including the risks, benefits and alternatives for the proposed anesthesia with the patient or authorized representative who has indicated his/her understanding and acceptance.   Dental advisory given  Plan Discussed with: CRNA  Anesthesia Plan Comments:         Anesthesia Quick Evaluation

## 2016-04-03 NOTE — Transfer of Care (Signed)
Immediate Anesthesia Transfer of Care Note  Patient: Tracey Manning  Procedure(s) Performed: Procedure(s): DILATATION & CURETTAGE/HYSTEROSCOPY WITH HYDROTHERMAL ABLATION (N/A)  Patient Location: PACU  Anesthesia Type:General  Level of Consciousness: sedated  Airway & Oxygen Therapy: Patient Spontanous Breathing and Patient connected to nasal cannula oxygen  Post-op Assessment: Report given to RN  Post vital signs: Reviewed  Last Vitals:  Filed Vitals:   04/03/16 1000  BP: 154/90  Pulse: 70  Temp: 36.6 C  Resp: 20    Last Pain:  Filed Vitals:   04/03/16 1003  PainSc: 10-Worst pain ever      Patients Stated Pain Goal: 4 ([id number])  Complications: No apparent anesthesia complications

## 2016-04-03 NOTE — Progress Notes (Signed)
Dr Marcell Barlow reevaluated patient and instructed me to call Dr. [name] for orders to admit for observation overnight due to patient's elevated BP, pain not controlled - continue to treat pain and nausea if needed.

## 2016-04-03 NOTE — Discharge Instructions (Signed)
D&C/Endometrial Ablation Care After Read the instructions below. Refer to this sheet in the next few weeks. These instructions provide you with general information on caring for yourself after you leave the hospital. Your caregiver may also give you specific instructions.  A D&C/endometrial ablation  is a minor operation. A D&C involves the stretching (dilatation) of the cervix and scraping (curettage) of the inside lining of the uterus. Endometrial ablation (EA) is a surgery that makes a womans period much lighter or stops it completely.  You may have light cramping and bleeding for a couple of days to two weeks after the procedure. This procedure may be done in a hospital, outpatient clinic, or doctor's office. You may be given a drug to make you sleep (general anesthetic) or a drug that numbs the area (local anesthetic) in and around the cervix. HOME CARE INSTRUCTIONS  Do not drive for 24 hours.   Wait one week before returning to strenuous activities.   Take your temperature two times a day for 4 days and write it down. Provide these temperatures to your caregiver if they are abnormal (above 98.6 F or 37.0 C).   Avoid long periods of standing, and do no heavy lifting (more than 10 pounds), pushing or pulling.   Limit stair climbing to once or twice a day.   Take rest periods often.   You may resume your usual diet.   Drink plenty of fluids (6-8 glasses a day).   You should return to your usual bowel function. If constipation should occur, you may:   Take a mild laxative with permission from your caregiver.   Add fruit and bran to your diet.   Drink more fluids. This helps with constipation.   Take showers instead of baths until your caregiver gives you permission to take baths.   Do not go swimming or use a hot tub until your caregiver gives you permission.   Try to have someone with you or available for you the first 24 to 48 hours, especially if you had a general  anesthetic.   Do not douche, use tampons, or have intercourse until after your follow-up appointment, or when your caregiver approves.   Only take over-the-counter or prescription medicines for pain, discomfort, or fever as directed by your caregiver. Do not take aspirin. It can cause bleeding.   If a prescription was given, follow your caregiver's directions. You may be given a medicine that kills germs (antibiotic) to prevent an infection.   Keep all your follow-up appointments recommended by your caregiver.  SEEK MEDICAL CARE IF:  You have increasing cramps or pain not relieved with medication.   You develop belly (abdominal) pain which does not seem to be related to the same area of earlier cramping and pain.   You feel dizzy or feel like fainting.   You have bad smelling vaginal discharge.   You develop a rash.   You develop a reaction or allergy to your medication.  SEEK IMMEDIATE MEDICAL CARE IF:  Bleeding is heavier than a normal menstrual period.   You have an oral temperature above 100.6, not controlled by medicine.   You develop chest pain.   You develop shortness of breath.   You pass out.   You develop pain in your shoulder strap area.   You develop heavy vaginal bleeding with or without blood clots.  MAKE SURE YOU:   Understand these instructions.   Will watch your condition.   Will get help right away if  you are not doing well or get worse.  Document Released: 11/06/2000 Document Re-Released: 04/29/2010 [facility] Patient Information 2011 [place].

## 2016-04-03 NOTE — Anesthesia Postprocedure Evaluation (Deleted)
Anesthesia Post Note  Patient: Tracey Manning  Procedure(s) Performed: Procedure(s) (LRB): DILATATION & CURETTAGE/HYSTEROSCOPY WITH HYDROTHERMAL ABLATION (N/A)  Patient location during evaluation: PACU Anesthesia Type: General Level of consciousness: awake and alert Pain management: pain level controlled Vital Signs Assessment: post-procedure vital signs reviewed and stable Respiratory status: spontaneous breathing, nonlabored ventilation, respiratory function stable and patient connected to nasal cannula oxygen Cardiovascular status: blood pressure returned to baseline and stable Postop Assessment: no signs of nausea or vomiting Anesthetic complications: no     Last Vitals:  Filed Vitals:   04/03/16 1000 04/03/16 1400  BP: 154/90 166/107  Pulse: 70 84  Temp: 36.6 C 37.2 C  Resp: 20 15    Last Pain:  Filed Vitals:   04/03/16 1407  PainSc: 10-Worst pain ever   Pain Goal: Patients Stated Pain Goal: 4 (04/03/16 1400)               Montez Hageman

## 2016-04-04 DIAGNOSIS — N939 Abnormal uterine and vaginal bleeding, unspecified: Secondary | ICD-10-CM | POA: Diagnosis not present

## 2016-04-04 MED ORDER — OXYCODONE-ACETAMINOPHEN 10-325 MG PO TABS: 1.0000 | ORAL_TABLET | Freq: Four times a day (QID) | ORAL | Status: DC | PRN

## 2016-04-04 NOTE — Addendum Note (Signed)
Addendum  created 04/04/16 0816 by Ignacia Bayley, CRNA   Modules edited: Clinical Notes   Clinical Notes:  File: [id number]

## 2016-04-04 NOTE — Progress Notes (Signed)
D/c instructions and prescriptions reviewed with pt. Pt has a f/u in 2 weeks with Dr. [name]. Pt stable, ambulatory to lobby states she will wait for her friend to pick her up should be here in 30 min. All questions and concerns answered.

## 2016-04-04 NOTE — Discharge Summary (Signed)
Physician Discharge Summary  Patient ID: Tracey Manning MRN: [id number] DOB/AGE: 25-Feb-1961 55 y.o.  Admit date: 04/03/2016 Discharge date: 04/04/2016  Admission Diagnoses:  AUB  Discharge Diagnoses:   AUB Active Problems:   Abnormal uterine bleeding (AUB)   Discharged Condition: good  Hospital Course: Admitted for observation after outpatient endometrial ablation because of unstable BP and pain control.  Much improved this am.  Ready for discharge.  Consults: None  Significant Diagnostic Studies: none  Treatments: IV hydration and cardiac meds: BP meds  Discharge Exam: Blood pressure 146/87, pulse 79, temperature 98.8 F (37.1 C), temperature source Oral, resp. rate 16, height 5\' 1"  (1.549 m), weight 205 lb (92.987 kg), SpO2 98 %. General appearance: alert and no distress Resp: clear to auscultation bilaterally Cardio: regular rate and rhythm GI: soft, non-tender; bowel sounds normal; no masses,  no organomegaly Pelvic: Scant vaginal bleeding Extremities: extremities normal, atraumatic, no cyanosis or edema  Disposition: 01-Home or Self Care  Discharge Instructions    Diet - low sodium heart healthy    Complete by:  As directed      Diet - low sodium heart healthy    Complete by:  As directed      Increase activity slowly    Complete by:  As directed      Increase activity slowly    Complete by:  As directed             Medication List    STOP taking these medications        medroxyPROGESTERone 10 MG tablet  Commonly known as:  PROVERA      TAKE these medications        albuterol 108 (90 Base) MCG/ACT inhaler  Commonly known as:  PROVENTIL HFA;VENTOLIN HFA  Inhale 2 puffs into the lungs every 6 (six) hours as needed for wheezing or shortness of breath.     amLODipine 10 MG tablet  Commonly known as:  NORVASC  Take 10 mg by mouth daily.     aspirin EC 325 MG tablet  Take 325 mg by mouth daily.     atorvastatin 40 MG tablet  Commonly known as:   LIPITOR  Take 1 tablet (40 mg total) by mouth daily.     clopidogrel 75 MG tablet  Commonly known as:  PLAVIX  Take 1 tablet (75 mg total) by mouth daily with breakfast.     fluticasone-salmeterol 115-21 MCG/ACT inhaler  Commonly known as:  ADVAIR HFA  Inhale 2 puffs into the lungs 2 (two) times daily.     ibuprofen 800 MG tablet  Commonly known as:  ADVIL,MOTRIN  Take 1 tablet (800 mg total) by mouth every 8 (eight) hours as needed for mild pain or moderate pain.     lisinopril-hydrochlorothiazide 20-12.5 MG tablet  Commonly known as:  PRINZIDE,ZESTORETIC  Take 2 tablets by mouth daily.     nitroGLYCERIN 0.4 MG SL tablet  Commonly known as:  NITROSTAT  Place 1 tablet (0.4 mg total) under the tongue every 5 (five) minutes as needed for chest pain.     oxyCODONE-acetaminophen 10-325 MG tablet  Commonly known as:  PERCOCET  Take 1 tablet by mouth every 4 (four) hours as needed for pain.     oxyCODONE-acetaminophen 10-325 MG tablet  Commonly known as:  PERCOCET  Take 1 tablet by mouth every 4 (four) hours as needed for pain.     sertraline 50 MG tablet  Commonly known as:  ZOLOFT  Take 50 mg  by mouth daily.     zolpidem 10 MG tablet  Commonly known as:  AMBIEN  Take 10 mg by mouth at bedtime as needed for sleep.           Follow-up Information    Follow up with Evania Lyne A, MD. Schedule an appointment as soon as possible for a visit in 2 weeks.   Specialty:  Obstetrics and Gynecology   Contact information:   [address] [place] [address] [phone]       Follow up with Kenny Rea A, MD In 2 weeks.   Specialty:  Obstetrics and Gynecology   Contact information:   [address] [place] [address] [phone]       Signed: [name] 04/04/2016, 8:06 AM

## 2016-04-04 NOTE — Anesthesia Postprocedure Evaluation (Signed)
Anesthesia Post Note  Patient: Tracey Manning  Procedure(s) Performed: Procedure(s) (LRB): DILATATION & CURETTAGE/HYSTEROSCOPY WITH HYDROTHERMAL ABLATION (N/A)  Patient location during evaluation: [facility] Anesthesia Type: General Level of consciousness: awake Pain management: pain level controlled Vital Signs Assessment: post-procedure vital signs reviewed and stable Respiratory status: spontaneous breathing Cardiovascular status: stable Postop Assessment: no headache, no backache, patient able to bend at knees, no signs of nausea or vomiting and adequate PO intake Anesthetic complications: no     Last Vitals:  Filed Vitals:   04/04/16 0203 04/04/16 0542  BP: 162/86 146/87  Pulse: 101 79  Temp: 37.1 C 37.1 C  Resp: 18 16    Last Pain:  Filed Vitals:   04/04/16 0543  PainSc: 0-No pain   Pain Goal: Patients Stated Pain Goal: 3 (04/03/16 2138)               Barkley Boards

## 2016-04-04 NOTE — Progress Notes (Signed)
1 Day Post-Op Procedure(s) (LRB): DILATATION & CURETTAGE/HYSTEROSCOPY WITH HYDROTHERMAL ABLATION (N/A)  Subjective: Patient reports tolerating PO, + flatus and no problems voiding.    Objective: I have reviewed patient's vital signs, intake and output, medications and labs.  General: alert and no distress Resp: clear to auscultation bilaterally Cardio: regular rate and rhythm GI: soft, non-tender; bowel sounds normal; no masses,  no organomegaly Vaginal Bleeding: scant  Assessment: s/p Procedure(s): DILATATION & CURETTAGE/HYSTEROSCOPY WITH HYDROTHERMAL ABLATION (N/A): stable  Plan: Discharge home     HARPER,CHARLES A 04/04/2016, 8:01 AM

## 2016-04-06 ENCOUNTER — Encounter (HOSPITAL_COMMUNITY): Payer: Self-pay | Admitting: Obstetrics

## 2016-04-13 ENCOUNTER — Telehealth: Payer: Self-pay | Admitting: *Deleted

## 2016-04-13 NOTE — Telephone Encounter (Signed)
Patient wants to know if she can take a bath. She had D&E on 5/12.

## 2016-04-13 NOTE — Telephone Encounter (Signed)
Yes, she may take a bath.

## 2016-04-14 NOTE — Telephone Encounter (Signed)
Patient notified

## 2016-04-16 ENCOUNTER — Ambulatory Visit (INDEPENDENT_AMBULATORY_CARE_PROVIDER_SITE_OTHER): Payer: Medicaid Other | Admitting: Obstetrics

## 2016-04-16 ENCOUNTER — Encounter: Payer: Self-pay | Admitting: Obstetrics

## 2016-04-16 VITALS — BP 141/93 | HR 81 | Wt 210.0 lb

## 2016-04-16 DIAGNOSIS — Z9889 Other specified postprocedural states: Secondary | ICD-10-CM | POA: Diagnosis not present

## 2016-04-16 DIAGNOSIS — N939 Abnormal uterine and vaginal bleeding, unspecified: Secondary | ICD-10-CM

## 2016-04-20 ENCOUNTER — Encounter: Payer: Self-pay | Admitting: Obstetrics

## 2016-04-20 ENCOUNTER — Other Ambulatory Visit: Payer: Self-pay | Admitting: Obstetrics

## 2016-04-20 DIAGNOSIS — N760 Acute vaginitis: Principal | ICD-10-CM

## 2016-04-20 DIAGNOSIS — B9689 Other specified bacterial agents as the cause of diseases classified elsewhere: Secondary | ICD-10-CM

## 2016-04-20 LAB — NUSWAB VG+, CANDIDA 6SP
Atopobium vaginae: HIGH Score — AB
CANDIDA GLABRATA, NAA: NEGATIVE
CANDIDA KRUSEI, NAA: NEGATIVE
CANDIDA TROPICALIS, NAA: NEGATIVE
Candida albicans, NAA: NEGATIVE
Candida lusitaniae, NAA: NEGATIVE
Candida parapsilosis, NAA: NEGATIVE
Chlamydia trachomatis, NAA: NEGATIVE
Neisseria gonorrhoeae, NAA: NEGATIVE
TRICH VAG BY NAA: NEGATIVE

## 2016-04-20 MED ORDER — CLINDAMYCIN HCL 300 MG PO CAPS: 300.0000 mg | ORAL_CAPSULE | Freq: Three times a day (TID) | ORAL | Status: DC

## 2016-04-20 NOTE — Progress Notes (Signed)
Patient ID: Tracey Manning, female   DOB: 1961/10/03, 55 y.o.   MRN: [id number]  Chief Complaint  Patient presents with  . Follow-up    Post opt - Hysteroscopy    HPI Tracey Manning is a 55 y.o. female.  S/P Hysteroscopy / HTA.  No complaints.  HPI  Past Medical History  Diagnosis Date  . Coronary artery disease   . Sciatica   . Hypertension   . Asthma   . Dyslipidemia   . Tobacco abuse   . Chest pain   . Collagen vascular disease ([place])   . Anxiety   . Depression   . Myocardial infarct ([place]) [id number]  . Sleep apnea     no CPAP ordered but using oxygen at bedtime  . GERD (gastroesophageal reflux disease)     occ  . Arthritis     Past Surgical History  Procedure Laterality Date  . Cesarean section    . Dilation and curettage, diagnostic / therapeutic    . Nm myocar perf wall motion  08/2009    persantine myoview - normal perfusion in all regions, perfusion defect in anterior region (breast attenuation), EF 52%, low risk scan  . Cardiac catheterization  10/09/2003    [facility] [place] [place]) - LAD with 30% prox narrowing, 50% stenosis in mid-portion of PLA; RCA with 40% narrowing proximally ([name], [name])  . Cardiac catheterization  10/10/2007    70% stenosis in first septal perforator branch of LAD, 60-70% narrowing in mid LAD, 20% narrowing in mid AV groove Cfx with 80% diffuse narrowing in small distal marginal, total occlusion of mid RCA with L to R collaterals, 90% stenosis diffusely in prox branch of RCA followed by 70% stenosis in secondary curve & 80% stenosis in small marginal branch ([name])  . Coronary angioplasty with stent placement  10/31/2007    PCI of distal Cfx with 2.25x83mm Taxus Adam DES, 60% narrowing of mid LAD ([name])  . Cardiac catheterization  05/28/2008    normal L main, RCA with 100% prox lesion w/distal filling from LAD collaterals, LAD with 20% prox tubular lesion/ 40% mid LAD lesion/previous stent patent ([name])  . Cardiac catheterization  09/15/2009    discrete 100% osital RCA lesion, 50% prox LAD lesion, non-obstructive disease in all coronaries ([name])  . Coronary angioplasty with stent placement  06/11/2011    PCI of prox-mid LAD with 3x60mm DES Resolute ([name])  . Colonoscopy    . Colonoscopy w/ polypectomy    . Dilitation & currettage/hystroscopy with hydrothermal ablation N/A 04/03/2016    Procedure: DILATATION & CURETTAGE/HYSTEROSCOPY WITH HYDROTHERMAL ABLATION;  Surgeon: [name], MD;  Location: [facility];  Service: Gynecology;  Laterality: N/A;    Family History  Problem Relation Age of Onset  . Leukemia Mother   . Clotting disorder Father     blood clot  . Hypertension Sister   . Diabetes Sister   . Stroke Sister 73  . Bleeding Disorder Son     ITP "free bleeding disorder"    Social History Social History  Substance Use Topics  . Smoking status: Current Every Day Smoker -- 0.50 packs/day for 20 years    Types: Cigarettes  . Smokeless tobacco: Never Used  . Alcohol Use: 0.0 oz/week    0 Standard drinks or equivalent per week     Comment: Occassionally     Allergies  Allergen Reactions  . Other Anaphylaxis and  Hives    Fresh strawberries (throat swelled)  . Amoxicillin Nausea And Vomiting  . Dilaudid [Hydromorphone Hcl] Nausea And Vomiting  . Latex Swelling    No reaction with bandaids    Current Outpatient Prescriptions  Medication Sig Dispense Refill  . albuterol (PROVENTIL HFA;VENTOLIN HFA) 108 (90 BASE) MCG/ACT inhaler Inhale 2 puffs into the lungs every 6 (six) hours as needed for wheezing or shortness of breath.    Marland Kitchen amLODipine (NORVASC) 10 MG tablet Take 10 mg by mouth daily.    Marland Kitchen aspirin EC 325 MG tablet Take 325 mg by mouth daily.    Marland Kitchen atorvastatin (LIPITOR) 40 MG tablet Take 1 tablet (40 mg total) by mouth daily. 30 tablet 2  . clopidogrel (PLAVIX) 75 MG tablet Take 1 tablet (75 mg total) by mouth daily with breakfast. 30 tablet 2   . fluticasone-salmeterol (ADVAIR HFA) 115-21 MCG/ACT inhaler Inhale 2 puffs into the lungs 2 (two) times daily. 1 Inhaler 12  . lisinopril-hydrochlorothiazide (PRINZIDE,ZESTORETIC) 20-12.5 MG per tablet Take 2 tablets by mouth daily.    . nitroGLYCERIN (NITROSTAT) 0.4 MG SL tablet Place 1 tablet (0.4 mg total) under the tongue every 5 (five) minutes as needed for chest pain. 25 tablet 6  . sertraline (ZOLOFT) 50 MG tablet Take 50 mg by mouth daily.    Marland Kitchen zolpidem (AMBIEN) 10 MG tablet Take 10 mg by mouth at bedtime as needed for sleep.    . clindamycin (CLEOCIN) 300 MG capsule Take 1 capsule (300 mg total) by mouth 3 (three) times daily. 30 capsule 0   No current facility-administered medications for this visit.    Review of Systems Review of Systems Constitutional: negative for fatigue and weight loss Respiratory: negative for cough and wheezing Cardiovascular: negative for chest pain, fatigue and palpitations Gastrointestinal: negative for abdominal pain and change in bowel habits Genitourinary:negative Integument/breast: negative for nipple discharge Musculoskeletal:negative for myalgias Neurological: negative for gait problems and tremors Behavioral/Psych: negative for abusive relationship, depression Endocrine: negative for temperature intolerance     Blood pressure 141/93, pulse 81, weight 210 lb (95.255 kg), last menstrual period 11/01/2015.  Physical Exam Physical Exam:           General:  Alert and no distress Abdomen:  normal findings: no organomegaly, soft, non-tender and no hernia  Pelvis:  External genitalia: normal general appearance Urinary system: urethral meatus normal and bladder without fullness, nontender Vaginal: normal without tenderness, induration or masses Cervix: normal appearance Adnexa: normal bimanual exam Uterus: anteverted and non-tender, normal size      Data Reviewed Pathology  Assessment     AUB.  Doing well after Hysteroscopy / HTA      Plan    F/U 3 months   No orders of the defined types were placed in this encounter.   No orders of the defined types were placed in this encounter.

## 2016-04-23 ENCOUNTER — Encounter: Payer: Self-pay | Admitting: *Deleted

## 2016-05-07 ENCOUNTER — Telehealth: Payer: Self-pay | Admitting: *Deleted

## 2016-05-07 NOTE — Telephone Encounter (Signed)
Patient states she is having bad cramping after her surgical procedure. Call forwarded to provider.

## 2016-07-12 ENCOUNTER — Emergency Department (HOSPITAL_COMMUNITY): Payer: Medicaid Other

## 2016-07-12 ENCOUNTER — Encounter (HOSPITAL_COMMUNITY): Payer: Self-pay | Admitting: Emergency Medicine

## 2016-07-12 ENCOUNTER — Other Ambulatory Visit: Payer: Self-pay

## 2016-07-12 DIAGNOSIS — Z79899 Other long term (current) drug therapy: Secondary | ICD-10-CM | POA: Diagnosis not present

## 2016-07-12 DIAGNOSIS — J45909 Unspecified asthma, uncomplicated: Secondary | ICD-10-CM | POA: Insufficient documentation

## 2016-07-12 DIAGNOSIS — Z955 Presence of coronary angioplasty implant and graft: Secondary | ICD-10-CM | POA: Insufficient documentation

## 2016-07-12 DIAGNOSIS — Z7902 Long term (current) use of antithrombotics/antiplatelets: Secondary | ICD-10-CM | POA: Diagnosis not present

## 2016-07-12 DIAGNOSIS — Z7982 Long term (current) use of aspirin: Secondary | ICD-10-CM | POA: Diagnosis not present

## 2016-07-12 DIAGNOSIS — I2510 Atherosclerotic heart disease of native coronary artery without angina pectoris: Secondary | ICD-10-CM | POA: Diagnosis not present

## 2016-07-12 DIAGNOSIS — I252 Old myocardial infarction: Secondary | ICD-10-CM | POA: Insufficient documentation

## 2016-07-12 DIAGNOSIS — Z91040 Latex allergy status: Secondary | ICD-10-CM | POA: Insufficient documentation

## 2016-07-12 DIAGNOSIS — F17210 Nicotine dependence, cigarettes, uncomplicated: Secondary | ICD-10-CM | POA: Diagnosis not present

## 2016-07-12 DIAGNOSIS — R079 Chest pain, unspecified: Secondary | ICD-10-CM | POA: Diagnosis not present

## 2016-07-12 DIAGNOSIS — I10 Essential (primary) hypertension: Secondary | ICD-10-CM | POA: Insufficient documentation

## 2016-07-12 LAB — BASIC METABOLIC PANEL
Anion gap: 13 (ref 5–15)
BUN: 17 mg/dL (ref 6–20)
CHLORIDE: 102 mmol/L (ref 101–111)
CO2: 21 mmol/L — ABNORMAL LOW (ref 22–32)
CREATININE: 1.09 mg/dL — AB (ref 0.44–1.00)
Calcium: 9.5 mg/dL (ref 8.9–10.3)
GFR calc non Af Amer: 56 mL/min — ABNORMAL LOW (ref >60)
GLUCOSE: 129 mg/dL — AB (ref 65–99)
Potassium: 3.2 mmol/L — ABNORMAL LOW (ref 3.5–5.1)
SODIUM: 136 mmol/L (ref 135–145)

## 2016-07-12 LAB — CBC
HCT: 41.6 % (ref 36.0–46.0)
Hemoglobin: 13.8 g/dL (ref 12.0–15.0)
MCH: 28.7 pg (ref 26.0–34.0)
MCHC: 33.2 g/dL (ref 30.0–36.0)
MCV: 86.5 fL (ref 78.0–100.0)
PLATELETS: 230 10*3/uL (ref 150–400)
RBC: 4.81 MIL/uL (ref 3.87–5.11)
RDW: 15.7 % — ABNORMAL HIGH (ref 11.5–15.5)
WBC: 9.2 10*3/uL (ref 4.0–10.5)

## 2016-07-12 LAB — I-STAT TROPONIN, ED: Troponin i, poc: 0.01 ng/mL (ref 0.00–0.08)

## 2016-07-12 NOTE — ED Triage Notes (Signed)
Pt. reports central chest/left chest pain radiating to left shoulder/left upper arm onset last night with SOB and nausea , no emesis or diaphoresis . Her cardiologist is Dr. Claiborne Billings / Dr. Gwenlyn Found , history of CAD / Coronary stent placement .

## 2016-07-12 NOTE — ED Notes (Signed)
Mortimer Fries RN made aware of elevated BP

## 2016-07-13 ENCOUNTER — Emergency Department (HOSPITAL_COMMUNITY)
Admission: EM | Admit: 2016-07-13 | Discharge: 2016-07-13 | Disposition: A | Discharge status: Home / Self Care | Payer: Medicaid Other | Attending: Emergency Medicine | Admitting: Emergency Medicine

## 2016-07-13 DIAGNOSIS — R079 Chest pain, unspecified: Secondary | ICD-10-CM

## 2016-07-13 LAB — I-STAT TROPONIN, ED: Troponin i, poc: 0.01 ng/mL (ref 0.00–0.08)

## 2016-07-13 MED ORDER — MORPHINE SULFATE (PF) 4 MG/ML IV SOLN
4.0000 mg | Freq: Once | INTRAVENOUS | Status: AC
  Administered 2016-07-13: 4 mg via INTRAVENOUS
  Filled 2016-07-13: qty 1

## 2016-07-13 MED ORDER — NITROGLYCERIN 2 % TD OINT
1.0000 [in_us] | TOPICAL_OINTMENT | Freq: Once | TRANSDERMAL | Status: AC
  Administered 2016-07-13: 1 [in_us] via TOPICAL
  Filled 2016-07-13: qty 1

## 2016-07-13 NOTE — ED Provider Notes (Signed)
[facility] DEPT Provider Note   CSN: [id number] Arrival date & time: 07/12/16  2243  By signing my name below, I, Tracey Manning, attest that this documentation has been prepared under the direction and in the presence of [name], MD. Electronically Signed: Hansel Manning, ED Scribe. 07/13/16. 12:58 AM.    History   Chief Complaint Chief Complaint  Patient presents with  . Chest Pain    HPI Tracey Manning is a 55 y.o. female with h/o CAD, HTN, MI, cardiac stent, cardiac cath who presents to the Emergency Department complaining of moderate, intermittent 10/10 CP and heaviness onset yesterday. She also complains of left arm pain and intermittent SOB. She states her pain is not affected by exertion or eating. She states she has taken 325 mg ASA today, but she has not taken NTG today. Pt states her current pain feels similar to prior MIs. She notes that she also has pain frequently at baseline, but not as severe. Pt states she is supposed to take both ASA and NTG daily, but states that she does not take NTG daily because it causes her to have HA. Cardiologist is Dr. Gwenlyn Found. No h/o PE/DVT  She denies nausea, leg swelling.   Patient has previous history of coronary artery disease status post stent placement. Most recent Myoview 2015 which was negative. Normal EF on echo.  The history is provided by the patient. No language interpreter was used.    Past Medical History:  Diagnosis Date  . Anxiety   . Arthritis   . Asthma   . Chest pain   . Collagen vascular disease ([place])   . Coronary artery disease   . Depression   . Dyslipidemia   . GERD (gastroesophageal reflux disease)    occ  . Hypertension   . Myocardial infarct ([place]) [id number]  . Sciatica   . Sleep apnea    no CPAP ordered but using oxygen at bedtime  . Tobacco abuse     Patient Active Problem List   Diagnosis Date Noted  . Abnormal uterine bleeding (AUB) 04/03/2016  . Chest pain syndrome 11/14/2015  .  Dysfunctional uterine bleeding 11/14/2015  . Right sided abdominal pain 11/14/2015  . Right anterior shoulder pain 11/14/2015  . Pelvic pain in female 11/14/2015  . Bacterial vaginosis (recurrent) 11/14/2015  . Chest pain 11/14/2015  . Essential hypertension 04/04/2014  . Hyperlipidemia 04/04/2014  . Coronary artery disease 04/04/2014    Past Surgical History:  Procedure Laterality Date  . CARDIAC CATHETERIZATION  10/09/2003   [place], [place]) - LAD with 30% prox narrowing, 50% stenosis in mid-portion of PLA; RCA with 40% narrowing proximally ([name], [name])  . CARDIAC CATHETERIZATION  10/10/2007   70% stenosis in first septal perforator branch of LAD, 60-70% narrowing in mid LAD, 20% narrowing in mid AV groove Cfx with 80% diffuse narrowing in small distal marginal, total occlusion of mid RCA with L to R collaterals, 90% stenosis diffusely in prox branch of RCA followed by 70% stenosis in secondary curve & 80% stenosis in small marginal branch ([name])  . CARDIAC CATHETERIZATION  05/28/2008   normal L main, RCA with 100% prox lesion w/distal filling from LAD collaterals, LAD with 20% prox tubular lesion/ 40% mid LAD lesion/previous stent patent ([name])  . CARDIAC CATHETERIZATION  09/15/2009   discrete 100% osital RCA lesion, 50% prox LAD lesion, non-obstructive disease in all coronaries ([name])  . CESAREAN SECTION    .  COLONOSCOPY    . COLONOSCOPY W/ POLYPECTOMY    . CORONARY ANGIOPLASTY WITH STENT PLACEMENT  10/31/2007   PCI of distal Cfx with 2.25x29mm Taxus Adam DES, 60% narrowing of mid LAD ([name])  . CORONARY ANGIOPLASTY WITH STENT PLACEMENT  06/11/2011   PCI of prox-mid LAD with 3x58mm DES Resolute ([name])  . DILATION AND CURETTAGE, DIAGNOSTIC / THERAPEUTIC    . DILITATION & CURRETTAGE/HYSTROSCOPY WITH HYDROTHERMAL ABLATION N/A 04/03/2016   Procedure: DILATATION & CURETTAGE/HYSTEROSCOPY WITH HYDROTHERMAL ABLATION;  Surgeon:  [name], MD;  Location: [facility];  Service: Gynecology;  Laterality: N/A;  . NM MYOCAR PERF WALL MOTION  08/2009   persantine myoview - normal perfusion in all regions, perfusion defect in anterior region (breast attenuation), EF 52%, low risk scan    OB History    No data available       Home Medications    Prior to Admission medications   Medication Sig Start Date End Date Taking? Authorizing Provider  albuterol (PROVENTIL HFA;VENTOLIN HFA) 108 (90 BASE) MCG/ACT inhaler Inhale 2 puffs into the lungs every 6 (six) hours as needed for wheezing or shortness of breath.    Historical Provider, MD  amLODipine (NORVASC) 10 MG tablet Take 10 mg by mouth daily.    Historical Provider, MD  aspirin EC 325 MG tablet Take 325 mg by mouth daily.    Historical Provider, MD  atorvastatin (LIPITOR) 40 MG tablet Take 1 tablet (40 mg total) by mouth daily. 01/04/14   [name], MD  clindamycin (CLEOCIN) 300 MG capsule Take 1 capsule (300 mg total) by mouth 3 (three) times daily. 04/20/16   [name], MD  clopidogrel (PLAVIX) 75 MG tablet Take 1 tablet (75 mg total) by mouth daily with breakfast. 01/04/14   [name], MD  fluticasone-salmeterol (ADVAIR [facility]) [phone] MCG/ACT inhaler Inhale 2 puffs into the lungs 2 (two) times daily. 01/04/14   [name], MD  lisinopril-hydrochlorothiazide (PRINZIDE,ZESTORETIC) 20-12.5 MG per tablet Take 2 tablets by mouth daily.    Historical Provider, MD  nitroGLYCERIN (NITROSTAT) 0.4 MG SL tablet Place 1 tablet (0.4 mg total) under the tongue every 5 (five) minutes as needed for chest pain. 06/05/15   [name], MD  sertraline (ZOLOFT) 50 MG tablet Take 50 mg by mouth daily.    Historical Provider, MD  zolpidem (AMBIEN) 10 MG tablet Take 10 mg by mouth at bedtime as needed for sleep.    Historical Provider, MD    Family History Family History  Problem Relation Age of Onset  . Leukemia Mother   . Clotting disorder Father     blood clot  .  Hypertension Sister   . Diabetes Sister   . Stroke Sister 75  . Bleeding Disorder Son     ITP "free bleeding disorder"    Social History Social History  Substance Use Topics  . Smoking status: Current Every Day Smoker    Packs/day: 0.00    Years: 0.00    Types: Cigarettes  . Smokeless tobacco: Never Used  . Alcohol use 0.0 oz/week     Allergies   Other; Amoxicillin; Dilaudid [hydromorphone hcl]; and Latex   Review of Systems Review of Systems  Respiratory: Positive for shortness of breath.   Cardiovascular: Positive for chest pain. Negative for leg swelling.  Gastrointestinal: Negative for nausea.  Musculoskeletal: Positive for myalgias (left arm pain).  All other systems reviewed and are negative.   Physical Exam  Updated Vital Signs BP (!) 168/117 (BP Location: Right Arm)   Pulse 86   Temp 98 F (36.7 C) (Oral)   Resp 18   Ht 5\' 1"  (1.549 m)   Wt 206 lb (93.4 kg)   LMP  (LMP Unknown)   SpO2 99%   BMI 38.92 kg/m   Physical Exam  Constitutional: She is oriented to person, place, and time. She appears well-developed and well-nourished.  Overweight  HENT:  Head: Normocephalic and atraumatic.  Cardiovascular: Normal rate, regular rhythm and normal heart sounds.   No murmur heard. Pulmonary/Chest: Effort normal and breath sounds normal. No respiratory distress. She has no wheezes.  Abdominal: Soft. Bowel sounds are normal. There is no tenderness.  Musculoskeletal:  Trace lower extremity edema  Neurological: She is alert and oriented to person, place, and time.  Skin: Skin is warm and dry.  Psychiatric: She has a normal mood and affect.  Nursing note and vitals reviewed.    ED Treatments / Results  Labs (all labs ordered are listed, but only abnormal results are displayed) Labs Reviewed  BASIC METABOLIC PANEL - Abnormal; Notable for the following:       Result Value   Potassium 3.2 (*)    CO2 21 (*)    Glucose, Bld 129 (*)    Creatinine, Ser 1.09 (*)     GFR calc non Af Amer 56 (*)    All other components within normal limits  CBC - Abnormal; Notable for the following:    RDW 15.7 (*)    All other components within normal limits  I-STAT TROPOININ, ED  I-STAT TROPOININ, ED    EKG  EKG Interpretation  Date/Time:  Sunday July 12 2016 22:48:50 EDT Ventricular Rate:  92 PR Interval:  172 QRS Duration: 94 QT Interval:  386 QTC Calculation: 477 R Axis:   3 Text Interpretation:  Normal sinus rhythm Possible Left atrial enlargement Borderline ECG Confirmed by Dina Rich  MD, Loma Sousa ([id number]) on 07/13/2016 12:09:16 AM       Radiology Dg Chest 2 View  Result Date: 07/12/2016 CLINICAL DATA:  Left-sided chest pain today. History of coronary stenting EXAM: CHEST  2 VIEW COMPARISON:  03/25/2016 FINDINGS: Normal heart size and stable aortic contours. Negative hila. There is no edema, consolidation, effusion, or pneumothorax. No acute osseous finding. IMPRESSION: Stable.  No evidence of acute disease. Electronically Signed   By: Monte Fantasia M.D.   On: 07/12/2016 23:17    Procedures Procedures (including critical care time)  DIAGNOSTIC STUDIES: Oxygen Saturation is 99% on RA, normal by my interpretation.    COORDINATION OF CARE: 12:57 AM Discussed treatment plan with pt at bedside which includes CXR, lab work and pt agreed to plan.    Medications Ordered in ED Medications  nitroGLYCERIN (NITROGLYN) 2 % ointment 1 inch (1 inch Topical Given 07/13/16 0129)  morphine 4 MG/ML injection 4 mg (4 mg Intravenous Given 07/13/16 0129)     Initial Impression / Assessment and Plan / ED Course  I have reviewed the triage vital signs and the nursing notes.  Pertinent labs & imaging results that were available during my care of the patient were reviewed by me and considered in my medical decision making (see chart for details).  Clinical Course   Patient presents for chest pain. Ongoing for the last 24 hours. She does have a history of  coronary artery disease. Pain is very atypical for ACS; however, she does related to prior heart attacks. She is nontoxic.  Afebrile. Blood pressure 168/117. She does have reproducible pain on exam. EKG is nonischemic. Chest x-ray is reassuring. Troponin 2 negative. Would suspect troponin would be elevated given ongoing nature pain over last 24 hours if this was related to ischemic heart disease. Doubt PE. Patient had improvement of pain with morphine and nitroglycerin. She has nitroglycerin at home but does not like to take it because it gives her headache. I have encouraged her to follow-up very closely with cardiology given the recurrent nature of her pain. She may need repeat stress testing. She was given strict return precautions.  After history, exam, and medical workup I feel the patient has been appropriately medically screened and is safe for discharge home. Pertinent diagnoses were discussed with the patient. Patient was given return precautions.   Final Clinical Impressions(s) / ED Diagnoses   Final diagnoses:  Chest pain, unspecified chest pain type    New Prescriptions New Prescriptions   No medications on file    I personally performed the services described in this documentation, which was scribed in my presence. The recorded information has been reviewed and is accurate.    [name], MD 07/13/16 [id number]

## 2016-07-20 ENCOUNTER — Encounter: Payer: Medicaid Other | Admitting: Obstetrics

## 2016-09-07 ENCOUNTER — Ambulatory Visit: Payer: Self-pay | Admitting: Obstetrics

## 2016-09-11 ENCOUNTER — Emergency Department (HOSPITAL_COMMUNITY)
Admission: EM | Admit: 2016-09-11 | Discharge: 2016-09-11 | Disposition: A | Discharge status: Home / Self Care | Payer: Medicaid Other | Attending: Emergency Medicine | Admitting: Emergency Medicine

## 2016-09-11 ENCOUNTER — Encounter (HOSPITAL_COMMUNITY): Payer: Self-pay

## 2016-09-11 DIAGNOSIS — Z955 Presence of coronary angioplasty implant and graft: Secondary | ICD-10-CM | POA: Insufficient documentation

## 2016-09-11 DIAGNOSIS — I10 Essential (primary) hypertension: Secondary | ICD-10-CM | POA: Insufficient documentation

## 2016-09-11 DIAGNOSIS — I2510 Atherosclerotic heart disease of native coronary artery without angina pectoris: Secondary | ICD-10-CM | POA: Insufficient documentation

## 2016-09-11 DIAGNOSIS — J45909 Unspecified asthma, uncomplicated: Secondary | ICD-10-CM | POA: Insufficient documentation

## 2016-09-11 DIAGNOSIS — Z91040 Latex allergy status: Secondary | ICD-10-CM | POA: Diagnosis not present

## 2016-09-11 DIAGNOSIS — Z7982 Long term (current) use of aspirin: Secondary | ICD-10-CM | POA: Diagnosis not present

## 2016-09-11 DIAGNOSIS — F17210 Nicotine dependence, cigarettes, uncomplicated: Secondary | ICD-10-CM | POA: Diagnosis not present

## 2016-09-11 DIAGNOSIS — I252 Old myocardial infarction: Secondary | ICD-10-CM | POA: Diagnosis not present

## 2016-09-11 DIAGNOSIS — H578 Other specified disorders of eye and adnexa: Secondary | ICD-10-CM | POA: Diagnosis present

## 2016-09-11 DIAGNOSIS — H00015 Hordeolum externum left lower eyelid: Secondary | ICD-10-CM | POA: Insufficient documentation

## 2016-09-11 DIAGNOSIS — Z79899 Other long term (current) drug therapy: Secondary | ICD-10-CM | POA: Insufficient documentation

## 2016-09-11 MED ORDER — ERYTHROMYCIN 5 MG/GM OP OINT: 1.0000 "application " | TOPICAL_OINTMENT | Freq: Three times a day (TID) | OPHTHALMIC | 1 refills | Status: DC

## 2016-09-11 NOTE — ED Triage Notes (Signed)
Per Pt, Pt reports having eye drainage and swelling to the left eye with some blurriness. Pt reports having symptoms for one month. Denies fevers.

## 2016-09-11 NOTE — ED Notes (Signed)
Papers and prescriptions reviewed with patient. SHe received no medication while here and expresses intent to follow up with the eye doctor. SHe states she has been trying to get an appointment but has been unsuccessful and came in today to get the immediate problem alleviated while she waits for the appointment. Leaving ambulatory today by herself

## 2016-09-11 NOTE — Discharge Instructions (Signed)

## 2016-09-11 NOTE — ED Provider Notes (Signed)
[facility] DEPT Provider Note   CSN: [id number] Arrival date & time: 09/11/16  0944     History   Chief Complaint Chief Complaint  Patient presents with  . Eye Drainage    HPI Tracey Manning is a 55 y.o. female.  HPI  L eye drainage from a small sty / abscess appearing lesion on the L lower medial lid which started well over a month ago - she had been seen by opthalmology several times last month and had used a couple of different antibiotic drops (unsure of names) and has not had in a month as She has run out of the drops and has been out of town. She has not seen the eye doctor in over a month. She states that her left eye feels irritated. She has been using intermittent warm compresses and mashing on the area to get the pus to come out but states that it is tender  Past Medical History:  Diagnosis Date  . Anxiety   . Arthritis   . Asthma   . Chest pain   . Collagen vascular disease ([place])   . Coronary artery disease   . Depression   . Dyslipidemia   . GERD (gastroesophageal reflux disease)    occ  . Hypertension   . Myocardial infarct [id number]  . Sciatica   . Sleep apnea    no CPAP ordered but using oxygen at bedtime  . Tobacco abuse     Patient Active Problem List   Diagnosis Date Noted  . Abnormal uterine bleeding (AUB) 04/03/2016  . Chest pain syndrome 11/14/2015  . Dysfunctional uterine bleeding 11/14/2015  . Right sided abdominal pain 11/14/2015  . Right anterior shoulder pain 11/14/2015  . Pelvic pain in female 11/14/2015  . Bacterial vaginosis (recurrent) 11/14/2015  . Chest pain 11/14/2015  . Essential hypertension 04/04/2014  . Hyperlipidemia 04/04/2014  . Coronary artery disease 04/04/2014    Past Surgical History:  Procedure Laterality Date  . CARDIAC CATHETERIZATION  10/09/2003   [place], [place]) - LAD with 30% prox narrowing, 50% stenosis in mid-portion of PLA; RCA with 40% narrowing proximally ([name], [name])    . CARDIAC CATHETERIZATION  10/10/2007   70% stenosis in first septal perforator branch of LAD, 60-70% narrowing in mid LAD, 20% narrowing in mid AV groove Cfx with 80% diffuse narrowing in small distal marginal, total occlusion of mid RCA with L to R collaterals, 90% stenosis diffusely in prox branch of RCA followed by 70% stenosis in secondary curve & 80% stenosis in small marginal branch ([name])  . CARDIAC CATHETERIZATION  05/28/2008   normal L main, RCA with 100% prox lesion w/distal filling from LAD collaterals, LAD with 20% prox tubular lesion/ 40% mid LAD lesion/previous stent patent ([name])  . CARDIAC CATHETERIZATION  09/15/2009   discrete 100% osital RCA lesion, 50% prox LAD lesion, non-obstructive disease in all coronaries ([name])  . CESAREAN SECTION    . COLONOSCOPY    . COLONOSCOPY W/ POLYPECTOMY    . CORONARY ANGIOPLASTY WITH STENT PLACEMENT  10/31/2007   PCI of distal Cfx with 2.25x79mm Taxus Adam DES, 60% narrowing of mid LAD ([name])  . CORONARY ANGIOPLASTY WITH STENT PLACEMENT  06/11/2011   PCI of prox-mid LAD with 3x34mm DES Resolute ([name])  . DILATION AND CURETTAGE, DIAGNOSTIC / THERAPEUTIC    . DILITATION & CURRETTAGE/HYSTROSCOPY WITH HYDROTHERMAL ABLATION N/A 04/03/2016   Procedure: DILATATION & CURETTAGE/HYSTEROSCOPY WITH  HYDROTHERMAL ABLATION;  Surgeon: [name], MD;  Location: [facility];  Service: Gynecology;  Laterality: N/A;  . NM MYOCAR PERF WALL MOTION  08/2009   persantine myoview - normal perfusion in all regions, perfusion defect in anterior region (breast attenuation), EF 52%, low risk scan    OB History    No data available       Home Medications    Prior to Admission medications   Medication Sig Start Date End Date Taking? Authorizing Provider  albuterol (PROVENTIL HFA;VENTOLIN HFA) 108 (90 BASE) MCG/ACT inhaler Inhale 2 puffs into the lungs every 6 (six) hours as needed for wheezing or shortness of breath.     Historical Provider, MD  amLODipine (NORVASC) 10 MG tablet Take 10 mg by mouth daily.    Historical Provider, MD  aspirin EC 325 MG tablet Take 325 mg by mouth daily.    Historical Provider, MD  atorvastatin (LIPITOR) 40 MG tablet Take 1 tablet (40 mg total) by mouth daily. 01/04/14   [name], MD  clindamycin (CLEOCIN) 300 MG capsule Take 1 capsule (300 mg total) by mouth 3 (three) times daily. 04/20/16   [name], MD  clopidogrel (PLAVIX) 75 MG tablet Take 1 tablet (75 mg total) by mouth daily with breakfast. 01/04/14   [name], MD  erythromycin ophthalmic ointment Place 1 application into the left eye 3 (three) times daily. Place 1/2 inch ribbon of ointment in the affected eye 4 times a day 09/11/16   Noemi Chapel, MD  fluticasone-salmeterol (ADVAIR [facility]) [phone] MCG/ACT inhaler Inhale 2 puffs into the lungs 2 (two) times daily. 01/04/14   [name], MD  lisinopril-hydrochlorothiazide (PRINZIDE,ZESTORETIC) 20-12.5 MG per tablet Take 2 tablets by mouth daily.    Historical Provider, MD  nitroGLYCERIN (NITROSTAT) 0.4 MG SL tablet Place 1 tablet (0.4 mg total) under the tongue every 5 (five) minutes as needed for chest pain. 06/05/15   [name], MD  sertraline (ZOLOFT) 50 MG tablet Take 50 mg by mouth daily.    Historical Provider, MD  zolpidem (AMBIEN) 10 MG tablet Take 10 mg by mouth at bedtime as needed for sleep.    Historical Provider, MD    Family History Family History  Problem Relation Age of Onset  . Leukemia Mother   . Clotting disorder Father     blood clot  . Hypertension Sister   . Diabetes Sister   . Stroke Sister 41  . Bleeding Disorder Son     ITP "free bleeding disorder"    Social History Social History  Substance Use Topics  . Smoking status: Current Every Day Smoker    Packs/day: 0.00    Years: 0.00    Types: Cigarettes  . Smokeless tobacco: Never Used  . Alcohol use 0.0 oz/week     Allergies   Other; Amoxicillin; Dilaudid  [hydromorphone hcl]; and Latex   Review of Systems Review of Systems  Constitutional: Negative for fever.  Eyes: Positive for discharge.     Physical Exam Updated Vital Signs BP (!) 160/101   Pulse 87   Temp 98.8 F (37.1 C) (Oral)   Resp 14   Ht 5\' 5"  (1.651 m)   Wt 215 lb (97.5 kg)   SpO2 100%   BMI 35.78 kg/m   Physical Exam  Constitutional: She appears well-developed and well-nourished.  HENT:  Head: Normocephalic and atraumatic.  Eyes: Conjunctivae and EOM are normal. Pupils are equal, round, and reactive to light. Right  eye exhibits no discharge. Left eye exhibits discharge. No scleral icterus.  Left lower medial lid with small stye or abscess, approximately 1-2 mm in size, spontaneously draining onto the lid margin  Pulmonary/Chest: Effort normal. No respiratory distress.  Neurological: She is alert. Coordination normal.  Skin: Skin is warm and dry. No rash noted. She is not diaphoretic. No erythema.  Psychiatric: She has a normal mood and affect.  Nursing note and vitals reviewed.    ED Treatments / Results  Labs (all labs ordered are listed, but only abnormal results are displayed) Labs Reviewed - No data to display   Radiology No results found.  Procedures Procedures (including critical care time)  Medications Ordered in ED Medications - No data to display   Initial Impression / Assessment and Plan / ED Course  I have reviewed the triage vital signs and the nursing notes.  Pertinent labs & imaging results that were available during my care of the patient were reviewed by me and considered in my medical decision making (see chart for details).  Clinical Course    There is a clear tiny abscess on the lid margin, this is draining appropriately, I will place her on erythromycin ointment, follow-up with ophthalmology, she states she has R to call the office for an appointment but has not yet been given a time. Well-appearing, no signs of conjunctival  infection  Final Clinical Impressions(s) / ED Diagnoses   Final diagnoses:  Hordeolum externum of left lower eyelid    New Prescriptions New Prescriptions   ERYTHROMYCIN OPHTHALMIC OINTMENT    Place 1 application into the left eye 3 (three) times daily. Place 1/2 inch ribbon of ointment in the affected eye 4 times a day     Noemi Chapel, MD 09/11/16 1009

## 2016-09-14 ENCOUNTER — Telehealth: Payer: Self-pay | Admitting: *Deleted

## 2016-09-14 NOTE — Telephone Encounter (Signed)
Pharmacy called related to Rx: erythromycin instructions...EDCM clarified with EDP Rogene Houston) to change Rx to: place 1 application into left eye 3 times daily.

## 2016-09-15 ENCOUNTER — Encounter: Payer: Self-pay | Admitting: Obstetrics

## 2016-09-15 ENCOUNTER — Other Ambulatory Visit (HOSPITAL_COMMUNITY)
Admission: RE | Admit: 2016-09-15 | Discharge: 2016-09-15 | Disposition: A | Discharge status: Home / Self Care | Payer: Medicaid Other | Source: Ambulatory Visit | Attending: Obstetrics | Admitting: Obstetrics

## 2016-09-15 ENCOUNTER — Ambulatory Visit (INDEPENDENT_AMBULATORY_CARE_PROVIDER_SITE_OTHER): Payer: Medicaid Other | Admitting: Obstetrics

## 2016-09-15 VITALS — BP 182/133 | HR 71 | Temp 97.0°F | Wt 211.8 lb

## 2016-09-15 DIAGNOSIS — R102 Pelvic and perineal pain: Secondary | ICD-10-CM

## 2016-09-15 DIAGNOSIS — Z113 Encounter for screening for infections with a predominantly sexual mode of transmission: Secondary | ICD-10-CM | POA: Diagnosis not present

## 2016-09-15 DIAGNOSIS — N760 Acute vaginitis: Secondary | ICD-10-CM | POA: Insufficient documentation

## 2016-09-15 DIAGNOSIS — Z9889 Other specified postprocedural states: Secondary | ICD-10-CM | POA: Diagnosis not present

## 2016-09-15 DIAGNOSIS — N738 Other specified female pelvic inflammatory diseases: Secondary | ICD-10-CM | POA: Diagnosis not present

## 2016-09-15 DIAGNOSIS — N719 Inflammatory disease of uterus, unspecified: Secondary | ICD-10-CM

## 2016-09-15 DIAGNOSIS — N730 Acute parametritis and pelvic cellulitis: Secondary | ICD-10-CM

## 2016-09-15 DIAGNOSIS — R1084 Generalized abdominal pain: Secondary | ICD-10-CM

## 2016-09-15 LAB — POCT URINALYSIS DIPSTICK
Bilirubin, UA: NEGATIVE
Blood, UA: 250
Glucose, UA: NEGATIVE
Ketones, UA: NEGATIVE
LEUKOCYTES UA: NEGATIVE
NITRITE UA: NEGATIVE
PH UA: 5
PROTEIN UA: 1
Spec Grav, UA: 1.02
Urobilinogen, UA: NEGATIVE

## 2016-09-15 MED ORDER — METRONIDAZOLE 500 MG PO TABS: 500.0000 mg | ORAL_TABLET | Freq: Two times a day (BID) | ORAL | 1 refills | Status: DC

## 2016-09-15 MED ORDER — KETOROLAC TROMETHAMINE 60 MG/2ML IM SOLN
60.0000 mg | Freq: Once | INTRAMUSCULAR | Status: AC
  Administered 2016-09-15: 60 mg via INTRAMUSCULAR

## 2016-09-15 MED ORDER — OXYCODONE HCL 10 MG PO TABS: 10.0000 mg | ORAL_TABLET | Freq: Four times a day (QID) | ORAL | 0 refills | Status: DC | PRN

## 2016-09-15 MED ORDER — DOXYCYCLINE HYCLATE 100 MG PO TABS: 100.0000 mg | ORAL_TABLET | Freq: Two times a day (BID) | ORAL | 1 refills | Status: DC

## 2016-09-15 NOTE — Addendum Note (Signed)
Addended by: Valli Glance F on: 09/15/2016 04:50 PM   Modules accepted: Orders

## 2016-09-15 NOTE — Progress Notes (Signed)
Patient ID: Tracey Manning, female   DOB: 07-13-1961, 55 y.o.   MRN: [id number]  Chief Complaint  Patient presents with  . Gynecologic Exam    Patient reports she is having severe abdominal pain. Patient has not had a cycle since her surgery -  patient does have abdominal pain with intercourse.    HPI Tracey Manning is a 55 y.o. female.  Severe pelvic pain.  Had Endometrial Ablation in May 2017 for AUB.  No vaginal bleeding since ablation but has progressively more severe pain.  Denies fever / chills, dysuria, diarrhea or constipation.  Also c/o blister upper inner left thigh for 2-3 days. HPI  Past Medical History:  Diagnosis Date  . Anxiety   . Arthritis   . Asthma   . Chest pain   . Collagen vascular disease ([place])   . Coronary artery disease   . Depression   . Dyslipidemia   . GERD (gastroesophageal reflux disease)    occ  . Hypertension   . Myocardial infarct [id number]  . Sciatica   . Sleep apnea    no CPAP ordered but using oxygen at bedtime  . Tobacco abuse     Past Surgical History:  Procedure Laterality Date  . CARDIAC CATHETERIZATION  10/09/2003   [facility] [place], [place]) - LAD with 30% prox narrowing, 50% stenosis in mid-portion of PLA; RCA with 40% narrowing proximally ([name], [name])  . CARDIAC CATHETERIZATION  10/10/2007   70% stenosis in first septal perforator branch of LAD, 60-70% narrowing in mid LAD, 20% narrowing in mid AV groove Cfx with 80% diffuse narrowing in small distal marginal, total occlusion of mid RCA with L to R collaterals, 90% stenosis diffusely in prox branch of RCA followed by 70% stenosis in secondary curve & 80% stenosis in small marginal branch ([name])  . CARDIAC CATHETERIZATION  05/28/2008   normal L main, RCA with 100% prox lesion w/distal filling from LAD collaterals, LAD with 20% prox tubular lesion/ 40% mid LAD lesion/previous stent patent ([name])  . CARDIAC CATHETERIZATION  09/15/2009   discrete 100%  osital RCA lesion, 50% prox LAD lesion, non-obstructive disease in all coronaries ([name])  . CESAREAN SECTION    . COLONOSCOPY    . COLONOSCOPY W/ POLYPECTOMY    . CORONARY ANGIOPLASTY WITH STENT PLACEMENT  10/31/2007   PCI of distal Cfx with 2.25x56mm Taxus Adam DES, 60% narrowing of mid LAD ([name])  . CORONARY ANGIOPLASTY WITH STENT PLACEMENT  06/11/2011   PCI of prox-mid LAD with 3x6mm DES Resolute ([name])  . DILATION AND CURETTAGE, DIAGNOSTIC / THERAPEUTIC    . DILITATION & CURRETTAGE/HYSTROSCOPY WITH HYDROTHERMAL ABLATION N/A 04/03/2016   Procedure: DILATATION & CURETTAGE/HYSTEROSCOPY WITH HYDROTHERMAL ABLATION;  Surgeon: [name], MD;  Location: [facility];  Service: Gynecology;  Laterality: N/A;  . NM MYOCAR PERF WALL MOTION  08/2009   persantine myoview - normal perfusion in all regions, perfusion defect in anterior region (breast attenuation), EF 52%, low risk scan    Family History  Problem Relation Age of Onset  . Leukemia Mother   . Clotting disorder Father     blood clot  . Hypertension Sister   . Diabetes Sister   . Stroke Sister 90  . Bleeding Disorder Son     ITP "free bleeding disorder"    Social History Social History  Substance Use Topics  . Smoking status: Current Every Day Smoker    Packs/day:  0.50    Years: 0.00    Types: Cigarettes  . Smokeless tobacco: Never Used  . Alcohol use No     Comment: seldom    Allergies  Allergen Reactions  . Other Anaphylaxis and Hives    Fresh strawberries (throat swelled)  . Amoxicillin Nausea And Vomiting  . Dilaudid [Hydromorphone Hcl] Nausea And Vomiting  . Latex Swelling    No reaction with bandaids    Current Outpatient Prescriptions  Medication Sig Dispense Refill  . albuterol (PROVENTIL HFA;VENTOLIN HFA) 108 (90 BASE) MCG/ACT inhaler Inhale 2 puffs into the lungs every 6 (six) hours as needed for wheezing or shortness of breath.    Marland Kitchen amLODipine (NORVASC) 10 MG tablet Take 10 mg by  mouth daily.    Marland Kitchen aspirin EC 325 MG tablet Take 325 mg by mouth daily.    Marland Kitchen atorvastatin (LIPITOR) 40 MG tablet Take 1 tablet (40 mg total) by mouth daily. 30 tablet 2  . clopidogrel (PLAVIX) 75 MG tablet Take 1 tablet (75 mg total) by mouth daily with breakfast. 30 tablet 2  . erythromycin ophthalmic ointment Place 1 application into the left eye 3 (three) times daily. Place 1/2 inch ribbon of ointment in the affected eye 4 times a day 1 g 1  . fluticasone-salmeterol (ADVAIR HFA) 115-21 MCG/ACT inhaler Inhale 2 puffs into the lungs 2 (two) times daily. 1 Inhaler 12  . lisinopril-hydrochlorothiazide (PRINZIDE,ZESTORETIC) 20-12.5 MG per tablet Take 2 tablets by mouth daily.    . nitroGLYCERIN (NITROSTAT) 0.4 MG SL tablet Place 1 tablet (0.4 mg total) under the tongue every 5 (five) minutes as needed for chest pain. 25 tablet 6  . sertraline (ZOLOFT) 50 MG tablet Take 50 mg by mouth daily.    Marland Kitchen zolpidem (AMBIEN) 10 MG tablet Take 10 mg by mouth at bedtime as needed for sleep.    Marland Kitchen doxycycline (VIBRA-TABS) 100 MG tablet Take 1 tablet (100 mg total) by mouth 2 (two) times daily. 28 tablet 1  . metroNIDAZOLE (FLAGYL) 500 MG tablet Take 1 tablet (500 mg total) by mouth 2 (two) times daily. 28 tablet 1  . Oxycodone HCl 10 MG TABS Take 1 tablet (10 mg total) by mouth every 6 (six) hours as needed. 30 tablet 0   No current facility-administered medications for this visit.     Review of Systems Review of Systems Constitutional: negative for fatigue and weight loss Respiratory: negative for cough and wheezing Cardiovascular: negative for chest pain, fatigue and palpitations Gastrointestinal: negative for abdominal pain and change in bowel habits Genitourinary:positive for pelvic pain Integument/breast: negative for nipple discharge Musculoskeletal:negative for myalgias Neurological: negative for gait problems and tremors Behavioral/Psych: negative for abusive relationship, depression Endocrine:  negative for temperature intolerance     Blood pressure (!) 182/133, pulse 71, temperature 97 F (36.1 C), weight 211 lb 12.8 oz (96.1 kg).  Physical Exam Physical Exam General:   alert  Skin:   no rash or abnormalities  Lungs:   clear to auscultation bilaterally  Heart:   regular rate and rhythm, S1, S2 normal, no murmur, click, rub or gallop  Breasts:   normal without suspicious masses, skin or nipple changes or axillary nodes  Abdomen:  normal findings: no organomegaly, soft, non-tender and no hernia  Pelvis:  External genitalia: normal general appearance Urinary system: urethral meatus normal and bladder without fullness, nontender Vaginal: normal without tenderness, induration or masses Cervix: normal appearance Adnexa: not appreciated due to obesity and pain Uterus: not appreciated due  to obesity and pain  Thigh:  Small erosive lesion upper inner left thigh.  Herpes Cx done    50% of 20 min visit spent on counseling and coordination of care.   Data Reviewed Labs Ultrasound  Assessment     Pelvic pain S/P Endometrial Ablation ( [facility] ) Possible endometritis / PID Morbid Obesity HTN H/O Genital Herpes    Plan     Doxy / Flagyl Rx x 14 days  Ultrasound ordered  Oxycodone Rx  Patient has appointment with PCP tomorrow for HTN  F/U in 3 weeks  Orders Placed This Encounter  Procedures  . Herpes simplex virus culture    Order Specific Question:   Source    Answer:   labia  . POCT urinalysis dipstick   Meds ordered this encounter  Medications  . doxycycline (VIBRA-TABS) 100 MG tablet    Sig: Take 1 tablet (100 mg total) by mouth 2 (two) times daily.    Dispense:  28 tablet    Refill:  1  . metroNIDAZOLE (FLAGYL) 500 MG tablet    Sig: Take 1 tablet (500 mg total) by mouth 2 (two) times daily.    Dispense:  28 tablet    Refill:  1  . Oxycodone HCl 10 MG TABS    Sig: Take 1 tablet (10 mg total) by mouth every 6 (six) hours as needed.    Dispense:  30 tablet     Refill:  0          Patient ID: Tedi Denkins, female   DOB: June 19, 1961, 55 y.o.   MRN: [id number]

## 2016-09-16 LAB — CERVICOVAGINAL ANCILLARY ONLY
Chlamydia: NEGATIVE
NEISSERIA GONORRHEA: NEGATIVE
Trichomonas: NEGATIVE

## 2016-09-18 LAB — HERPES SIMPLEX VIRUS CULTURE

## 2016-09-21 LAB — CERVICOVAGINAL ANCILLARY ONLY
BACTERIAL VAGINITIS: POSITIVE — AB
Candida vaginitis: NEGATIVE

## 2016-09-22 ENCOUNTER — Other Ambulatory Visit: Payer: Medicaid Other

## 2016-09-22 ENCOUNTER — Other Ambulatory Visit: Payer: Self-pay | Admitting: Obstetrics

## 2016-09-30 ENCOUNTER — Ambulatory Visit (INDEPENDENT_AMBULATORY_CARE_PROVIDER_SITE_OTHER): Payer: Medicaid Other | Admitting: Obstetrics

## 2016-09-30 ENCOUNTER — Encounter: Payer: Self-pay | Admitting: Obstetrics

## 2016-09-30 VITALS — BP 153/103 | HR 73 | Temp 97.0°F | Wt 217.7 lb

## 2016-09-30 DIAGNOSIS — I10 Essential (primary) hypertension: Secondary | ICD-10-CM | POA: Diagnosis not present

## 2016-09-30 DIAGNOSIS — N719 Inflammatory disease of uterus, unspecified: Secondary | ICD-10-CM | POA: Diagnosis not present

## 2016-09-30 DIAGNOSIS — R102 Pelvic and perineal pain: Secondary | ICD-10-CM | POA: Diagnosis not present

## 2016-09-30 DIAGNOSIS — N730 Acute parametritis and pelvic cellulitis: Secondary | ICD-10-CM | POA: Diagnosis not present

## 2016-09-30 MED ORDER — OXYCODONE HCL 10 MG PO TABS: 10.0000 mg | ORAL_TABLET | Freq: Four times a day (QID) | ORAL | 0 refills | Status: DC | PRN

## 2016-10-01 NOTE — Progress Notes (Signed)
Patient ID: Tracey Manning, female   DOB: Jun 23, 1961, 55 y.o.   MRN: [id number]  Chief Complaint  Patient presents with  . Follow-up    pelvic pain, htn and endometrial ablation on 03/2016    HPI Tracey Manning is a 55 y.o. female.  Presents for follow up after Endometrial Ablation, which was complicated by endometritis post op.  Started on Doxy / Flagyl for 2 weeks.  Pain has resolved after antibiotics.  Denies vaginal bleeding. HPI  Past Medical History:  Diagnosis Date  . Anxiety   . Arthritis   . Asthma   . Chest pain   . Collagen vascular disease ([place])   . Coronary artery disease   . Depression   . Dyslipidemia   . GERD (gastroesophageal reflux disease)    occ  . Hypertension   . Myocardial infarct [id number]  . Sciatica   . Sleep apnea    no CPAP ordered but using oxygen at bedtime  . Tobacco abuse     Past Surgical History:  Procedure Laterality Date  . CARDIAC CATHETERIZATION  10/09/2003   [place], [place]) - LAD with 30% prox narrowing, 50% stenosis in mid-portion of PLA; RCA with 40% narrowing proximally ([name], [name])  . CARDIAC CATHETERIZATION  10/10/2007   70% stenosis in first septal perforator branch of LAD, 60-70% narrowing in mid LAD, 20% narrowing in mid AV groove Cfx with 80% diffuse narrowing in small distal marginal, total occlusion of mid RCA with L to R collaterals, 90% stenosis diffusely in prox branch of RCA followed by 70% stenosis in secondary curve & 80% stenosis in small marginal branch ([name])  . CARDIAC CATHETERIZATION  05/28/2008   normal L main, RCA with 100% prox lesion w/distal filling from LAD collaterals, LAD with 20% prox tubular lesion/ 40% mid LAD lesion/previous stent patent ([name])  . CARDIAC CATHETERIZATION  09/15/2009   discrete 100% osital RCA lesion, 50% prox LAD lesion, non-obstructive disease in all coronaries ([name])  . CESAREAN SECTION    . COLONOSCOPY    . COLONOSCOPY W/  POLYPECTOMY    . CORONARY ANGIOPLASTY WITH STENT PLACEMENT  10/31/2007   PCI of distal Cfx with 2.25x25mm Taxus Adam DES, 60% narrowing of mid LAD ([name])  . CORONARY ANGIOPLASTY WITH STENT PLACEMENT  06/11/2011   PCI of prox-mid LAD with 3x30mm DES Resolute ([name])  . DILATION AND CURETTAGE, DIAGNOSTIC / THERAPEUTIC    . DILITATION & CURRETTAGE/HYSTROSCOPY WITH HYDROTHERMAL ABLATION N/A 04/03/2016   Procedure: DILATATION & CURETTAGE/HYSTEROSCOPY WITH HYDROTHERMAL ABLATION;  Surgeon: [name], MD;  Location: [facility];  Service: Gynecology;  Laterality: N/A;  . NM MYOCAR PERF WALL MOTION  08/2009   persantine myoview - normal perfusion in all regions, perfusion defect in anterior region (breast attenuation), EF 52%, low risk scan    Family History  Problem Relation Age of Onset  . Leukemia Mother   . Clotting disorder Father     blood clot  . Hypertension Sister   . Diabetes Sister   . Stroke Sister 63  . Bleeding Disorder Son     ITP "free bleeding disorder"    Social History Social History  Substance Use Topics  . Smoking status: Current Every Day Smoker    Packs/day: 0.50    Years: 0.00    Types: Cigarettes  . Smokeless tobacco: Never Used  . Alcohol use No     Comment: seldom  Allergies  Allergen Reactions  . Other Anaphylaxis and Hives    Fresh strawberries (throat swelled)  . Amoxicillin Nausea And Vomiting  . Dilaudid [Hydromorphone Hcl] Nausea And Vomiting  . Latex Swelling    No reaction with bandaids    Current Outpatient Prescriptions  Medication Sig Dispense Refill  . albuterol (PROVENTIL HFA;VENTOLIN HFA) 108 (90 BASE) MCG/ACT inhaler Inhale 2 puffs into the lungs every 6 (six) hours as needed for wheezing or shortness of breath.    Marland Kitchen amLODipine (NORVASC) 10 MG tablet Take 10 mg by mouth daily.    Marland Kitchen aspirin EC 325 MG tablet Take 325 mg by mouth daily.    Marland Kitchen atorvastatin (LIPITOR) 40 MG tablet Take 1 tablet (40 mg total) by mouth daily.  30 tablet 2  . clopidogrel (PLAVIX) 75 MG tablet Take 1 tablet (75 mg total) by mouth daily with breakfast. 30 tablet 2  . doxycycline (VIBRA-TABS) 100 MG tablet Take 1 tablet (100 mg total) by mouth 2 (two) times daily. 28 tablet 1  . erythromycin ophthalmic ointment Place 1 application into the left eye 3 (three) times daily. Place 1/2 inch ribbon of ointment in the affected eye 4 times a day 1 g 1  . fluticasone-salmeterol (ADVAIR HFA) 115-21 MCG/ACT inhaler Inhale 2 puffs into the lungs 2 (two) times daily. 1 Inhaler 12  . lisinopril-hydrochlorothiazide (PRINZIDE,ZESTORETIC) 20-12.5 MG per tablet Take 2 tablets by mouth daily.    . metroNIDAZOLE (FLAGYL) 500 MG tablet Take 1 tablet (500 mg total) by mouth 2 (two) times daily. 28 tablet 1  . nitroGLYCERIN (NITROSTAT) 0.4 MG SL tablet Place 1 tablet (0.4 mg total) under the tongue every 5 (five) minutes as needed for chest pain. 25 tablet 6  . sertraline (ZOLOFT) 50 MG tablet Take 50 mg by mouth daily.    Marland Kitchen zolpidem (AMBIEN) 10 MG tablet Take 10 mg by mouth at bedtime as needed for sleep.    Marland Kitchen Oxycodone HCl 10 MG TABS Take 1 tablet (10 mg total) by mouth every 6 (six) hours as needed. 30 tablet 0   No current facility-administered medications for this visit.     Review of Systems Review of Systems Constitutional: negative for fatigue and weight loss Respiratory: negative for cough and wheezing Cardiovascular: negative for chest pain, fatigue and palpitations Gastrointestinal: negative for abdominal pain and change in bowel habits Genitourinary:positive for occasional cramping Integument/breast: negative for nipple discharge Musculoskeletal:negative for myalgias Neurological: negative for gait problems and tremors Behavioral/Psych: negative for abusive relationship, depression Endocrine: negative for temperature intolerance      Blood pressure (!) 153/103, pulse 73, temperature 97 F (36.1 C), temperature source Oral, weight 217 lb 11.2  oz (98.7 kg).  Physical Exam Physical Exam General:   alert  Skin:   no rash or abnormalities  Lungs:   clear to auscultation bilaterally  Heart:   regular rate and rhythm, S1, S2 normal, no murmur, click, rub or gallop  Breasts:   normal without suspicious masses, skin or nipple changes or axillary nodes  Abdomen:  normal findings: no organomegaly, soft, non-tender and no hernia  Pelvis:  External genitalia: normal general appearance Urinary system: urethral meatus normal and bladder without fullness, nontender Vaginal: normal without tenderness, induration or masses Cervix: normal appearance Adnexa: normal bimanual exam Uterus: anteverted and non-tender, normal size    50% of 15 min visit spent on counseling and coordination of care.    Data Reviewed Labs  Assessment     Endometritis probably  from [place] after Endometrial Ablation.  Resolving.with Doxy / Flagyl x 14 days Hypertension    Plan    Repeat Doxy / Flagyl for 2 more weeks  Referred to PCP today for BP evaluation  Follow up in 3 months   Orders Placed This Encounter  Procedures  . Ambulatory referral to Internal Medicine    Referral Priority:   Routine    Referral Type:   Consultation    Referral Reason:   Specialty Services Required    Requested Specialty:   Internal Medicine    Number of Visits Requested:   1   Meds ordered this encounter  Medications  . Oxycodone HCl 10 MG TABS    Sig: Take 1 tablet (10 mg total) by mouth every 6 (six) hours as needed.    Dispense:  30 tablet    Refill:  0

## 2016-10-27 ENCOUNTER — Encounter (HOSPITAL_COMMUNITY): Payer: Self-pay | Admitting: Emergency Medicine

## 2016-10-27 ENCOUNTER — Emergency Department (HOSPITAL_COMMUNITY)
Admission: EM | Admit: 2016-10-27 | Discharge: 2016-10-27 | Disposition: A | Discharge status: Home / Self Care | Payer: Medicaid Other | Attending: Emergency Medicine | Admitting: Emergency Medicine

## 2016-10-27 ENCOUNTER — Emergency Department (HOSPITAL_COMMUNITY): Payer: Medicaid Other

## 2016-10-27 DIAGNOSIS — Z955 Presence of coronary angioplasty implant and graft: Secondary | ICD-10-CM | POA: Insufficient documentation

## 2016-10-27 DIAGNOSIS — Z91040 Latex allergy status: Secondary | ICD-10-CM | POA: Insufficient documentation

## 2016-10-27 DIAGNOSIS — J45909 Unspecified asthma, uncomplicated: Secondary | ICD-10-CM | POA: Insufficient documentation

## 2016-10-27 DIAGNOSIS — Z79899 Other long term (current) drug therapy: Secondary | ICD-10-CM | POA: Diagnosis not present

## 2016-10-27 DIAGNOSIS — I2510 Atherosclerotic heart disease of native coronary artery without angina pectoris: Secondary | ICD-10-CM | POA: Insufficient documentation

## 2016-10-27 DIAGNOSIS — I252 Old myocardial infarction: Secondary | ICD-10-CM | POA: Insufficient documentation

## 2016-10-27 DIAGNOSIS — Z7982 Long term (current) use of aspirin: Secondary | ICD-10-CM | POA: Insufficient documentation

## 2016-10-27 DIAGNOSIS — Y929 Unspecified place or not applicable: Secondary | ICD-10-CM | POA: Insufficient documentation

## 2016-10-27 DIAGNOSIS — F17210 Nicotine dependence, cigarettes, uncomplicated: Secondary | ICD-10-CM | POA: Insufficient documentation

## 2016-10-27 DIAGNOSIS — W08XXXA Fall from other furniture, initial encounter: Secondary | ICD-10-CM | POA: Insufficient documentation

## 2016-10-27 DIAGNOSIS — I10 Essential (primary) hypertension: Secondary | ICD-10-CM | POA: Diagnosis not present

## 2016-10-27 DIAGNOSIS — W19XXXA Unspecified fall, initial encounter: Secondary | ICD-10-CM

## 2016-10-27 DIAGNOSIS — Y939 Activity, unspecified: Secondary | ICD-10-CM | POA: Diagnosis not present

## 2016-10-27 DIAGNOSIS — M25551 Pain in right hip: Secondary | ICD-10-CM | POA: Diagnosis not present

## 2016-10-27 DIAGNOSIS — M25511 Pain in right shoulder: Secondary | ICD-10-CM | POA: Diagnosis present

## 2016-10-27 DIAGNOSIS — Y999 Unspecified external cause status: Secondary | ICD-10-CM | POA: Diagnosis not present

## 2016-10-27 MED ORDER — OXYCODONE-ACETAMINOPHEN 5-325 MG PO TABS
1.0000 | ORAL_TABLET | Freq: Once | ORAL | Status: AC
  Administered 2016-10-27: 1 via ORAL
  Filled 2016-10-27: qty 1

## 2016-10-27 MED ORDER — NAPROXEN 500 MG PO TABS: 500.0000 mg | ORAL_TABLET | Freq: Two times a day (BID) | ORAL | 0 refills | Status: DC

## 2016-10-27 MED ORDER — METHOCARBAMOL 500 MG PO TABS: 500.0000 mg | ORAL_TABLET | Freq: Two times a day (BID) | ORAL | 0 refills | Status: DC

## 2016-10-27 NOTE — ED Notes (Signed)
Patient Alert and oriented X4. Stable and ambulatory. Patient verbalized understanding of the discharge instructions.  Patient belongings were taken by the patient.  

## 2016-10-27 NOTE — ED Provider Notes (Signed)
[name] DEPT Provider Note    By signing my name below, I, Bea Graff, attest that this documentation has been prepared under the direction and in the presence of Quincy Carnes, PA-C. Electronically Signed: Bea Graff, ED Scribe. 10/27/16. 2:44 PM.    History   Chief Complaint Chief Complaint  Patient presents with  . Fall   The history is provided by the patient and medical records. No language interpreter was used.    HPI Comments:  Tracey Manning is an obese 55 y.o. female who presents to the Emergency Department complaining of a fall that occurred four days ago. Pt states she fell off a step stool onto a cement floor while hanging curtains. She is experiencing right hip and right shoulder pain. She states she was seen by her PCP and was given a prescription for pain medication but she lost it. She has not taken anything for pain. Elevating her RUE increases her shoulder pain and walking increases her hip pain. She denies head trauma, LOC, numbness, tingling or weakness of any extremity, bruising, wounds, nausea, vomiting, neck pain or any other injuries. She is right hand dominant.    Past Medical History:  Diagnosis Date  . Anxiety   . Arthritis   . Asthma   . Chest pain   . Collagen vascular disease ([place])   . Coronary artery disease   . Depression   . Dyslipidemia   . GERD (gastroesophageal reflux disease)    occ  . Hypertension   . Myocardial infarct [id number]  . Sciatica   . Sleep apnea    no CPAP ordered but using oxygen at bedtime  . Tobacco abuse     Patient Active Problem List   Diagnosis Date Noted  . Abnormal uterine bleeding (AUB) 04/03/2016  . Chest pain syndrome 11/14/2015  . Dysfunctional uterine bleeding 11/14/2015  . Right sided abdominal pain 11/14/2015  . Right anterior shoulder pain 11/14/2015  . Pelvic pain in female 11/14/2015  . Bacterial vaginosis (recurrent) 11/14/2015  . Chest pain 11/14/2015  . Essential hypertension  04/04/2014  . Hyperlipidemia 04/04/2014  . Coronary artery disease 04/04/2014    Past Surgical History:  Procedure Laterality Date  . CARDIAC CATHETERIZATION  10/09/2003   [facility] [place], [place]) - LAD with 30% prox narrowing, 50% stenosis in mid-portion of PLA; RCA with 40% narrowing proximally ([name], [name])  . CARDIAC CATHETERIZATION  10/10/2007   70% stenosis in first septal perforator branch of LAD, 60-70% narrowing in mid LAD, 20% narrowing in mid AV groove Cfx with 80% diffuse narrowing in small distal marginal, total occlusion of mid RCA with L to R collaterals, 90% stenosis diffusely in prox branch of RCA followed by 70% stenosis in secondary curve & 80% stenosis in small marginal branch ([name])  . CARDIAC CATHETERIZATION  05/28/2008   normal L main, RCA with 100% prox lesion w/distal filling from LAD collaterals, LAD with 20% prox tubular lesion/ 40% mid LAD lesion/previous stent patent ([name])  . CARDIAC CATHETERIZATION  09/15/2009   discrete 100% osital RCA lesion, 50% prox LAD lesion, non-obstructive disease in all coronaries ([name])  . CESAREAN SECTION    . COLONOSCOPY    . COLONOSCOPY W/ POLYPECTOMY    . CORONARY ANGIOPLASTY WITH STENT PLACEMENT  10/31/2007   PCI of distal Cfx with 2.25x67mm Taxus Adam DES, 60% narrowing of mid LAD ([name])  . CORONARY ANGIOPLASTY WITH STENT PLACEMENT  06/11/2011   PCI  of prox-mid LAD with 3x57mm DES Resolute ([name])  . DILATION AND CURETTAGE, DIAGNOSTIC / THERAPEUTIC    . DILITATION & CURRETTAGE/HYSTROSCOPY WITH HYDROTHERMAL ABLATION N/A 04/03/2016   Procedure: DILATATION & CURETTAGE/HYSTEROSCOPY WITH HYDROTHERMAL ABLATION;  Surgeon: [name], MD;  Location: [facility];  Service: Gynecology;  Laterality: N/A;  . NM MYOCAR PERF WALL MOTION  08/2009   persantine myoview - normal perfusion in all regions, perfusion defect in anterior region (breast attenuation), EF 52%, low risk scan     OB History    No data available       Home Medications    Prior to Admission medications   Medication Sig Start Date End Date Taking? Authorizing Provider  albuterol (PROVENTIL HFA;VENTOLIN HFA) 108 (90 BASE) MCG/ACT inhaler Inhale 2 puffs into the lungs every 6 (six) hours as needed for wheezing or shortness of breath.    Historical Provider, MD  amLODipine (NORVASC) 10 MG tablet Take 10 mg by mouth daily.    Historical Provider, MD  aspirin EC 325 MG tablet Take 325 mg by mouth daily.    Historical Provider, MD  atorvastatin (LIPITOR) 40 MG tablet Take 1 tablet (40 mg total) by mouth daily. 01/04/14   [name], MD  clopidogrel (PLAVIX) 75 MG tablet Take 1 tablet (75 mg total) by mouth daily with breakfast. 01/04/14   [name], MD  doxycycline (VIBRA-TABS) 100 MG tablet Take 1 tablet (100 mg total) by mouth 2 (two) times daily. 09/15/16   [name], MD  erythromycin ophthalmic ointment Place 1 application into the left eye 3 (three) times daily. Place 1/2 inch ribbon of ointment in the affected eye 4 times a day 09/11/16   Noemi Chapel, MD  fluticasone-salmeterol (ADVAIR [facility]) [id number] MCG/ACT inhaler Inhale 2 puffs into the lungs 2 (two) times daily. 01/04/14   [name], MD  lisinopril-hydrochlorothiazide (PRINZIDE,ZESTORETIC) 20-12.5 MG per tablet Take 2 tablets by mouth daily.    Historical Provider, MD  metroNIDAZOLE (FLAGYL) 500 MG tablet Take 1 tablet (500 mg total) by mouth 2 (two) times daily. 09/15/16   [name], MD  nitroGLYCERIN (NITROSTAT) 0.4 MG SL tablet Place 1 tablet (0.4 mg total) under the tongue every 5 (five) minutes as needed for chest pain. 06/05/15   [name], MD  Oxycodone HCl 10 MG TABS Take 1 tablet (10 mg total) by mouth every 6 (six) hours as needed. 09/30/16   [name], MD  sertraline (ZOLOFT) 50 MG tablet Take 50 mg by mouth daily.    Historical Provider, MD  zolpidem (AMBIEN) 10 MG tablet Take 10 mg by mouth at  bedtime as needed for sleep.    Historical Provider, MD    Family History Family History  Problem Relation Age of Onset  . Leukemia Mother   . Clotting disorder Father     blood clot  . Hypertension Sister   . Diabetes Sister   . Stroke Sister 44  . Bleeding Disorder Son     ITP "free bleeding disorder"    Social History Social History  Substance Use Topics  . Smoking status: Current Every Day Smoker    Packs/day: 0.50    Years: 0.00    Types: Cigarettes  . Smokeless tobacco: Never Used  . Alcohol use No     Comment: seldom     Allergies   Other; Amoxicillin; Dilaudid [hydromorphone hcl]; and Latex   Review of Systems Review of Systems  Gastrointestinal: Negative for nausea and vomiting.  Musculoskeletal: Positive for myalgias. Negative for neck pain.  Skin: Negative for color change and wound.  Neurological: Negative for syncope, weakness and numbness.  All other systems reviewed and are negative.    Physical Exam Updated Vital Signs BP (!) 171/109 (BP Location: Right Arm)   Pulse 66   Temp 98.3 F (36.8 C) (Oral)   Resp 18   SpO2 99%   Physical Exam  Constitutional: She is oriented to person, place, and time. She appears well-developed and well-nourished.  No signs of head trauma  HENT:  Head: Normocephalic and atraumatic.  Mouth/Throat: Oropharynx is clear and moist.  Eyes: Conjunctivae and EOM are normal. Pupils are equal, round, and reactive to light.  Neck: Normal range of motion.  Cardiovascular: Normal rate, regular rhythm and normal heart sounds.   Pulmonary/Chest: Effort normal and breath sounds normal.  Abdominal: Soft. Bowel sounds are normal.  Musculoskeletal:  Freely moving the right shoulder and right hip during conversation, however during formal exam patient begins wincing and crying before she is even touched; seems to have pain of the lateral right hip and shoulder; she has full ROM of both areas; no gross deformities; no leg  shortening; normal strength in all 4 extremities, normal gait  Neurological: She is alert and oriented to person, place, and time.  Skin: Skin is warm and dry.  Psychiatric: She has a normal mood and affect.  Nursing note and vitals reviewed.    ED Treatments / Results  DIAGNOSTIC STUDIES: Oxygen Saturation is 99% on RA, normal by my interpretation.   COORDINATION OF CARE: 2:42 PM- Will order imaging and pain medication. Pt verbalizes understanding and agrees to plan.  Medications - No data to display  Labs (all labs ordered are listed, but only abnormal results are displayed) Labs Reviewed - No data to display  EKG  EKG Interpretation None       Radiology Dg Shoulder Right  Result Date: 10/27/2016 CLINICAL DATA:  [place] onto floor 3 days ago.  Right shoulder pain. EXAM: RIGHT SHOULDER - 2+ VIEW COMPARISON:  11/14/2015 FINDINGS: No shoulder dislocation presently. No evidence of glenohumeral degenerative change. Normal humeral acromial distance. 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 Regional ribs appear normal. IMPRESSION: No acute or traumatic finding.  Mild osteoarthritis at the [facility] joint. Electronically Signed   By: Nelson Chimes M.D.   On: 10/27/2016 16:00   Dg Hip Unilat W Or Wo Pelvis 2-3 Views Right  Result Date: 10/27/2016 CLINICAL DATA:  Status post fall onto a cement floor 3 days ago with persistent pain over the right hip. EXAM: DG HIP (WITH OR WITHOUT PELVIS) 2-3V RIGHT COMPARISON:  CT scan of the abdomen and pelvis dated November 14, 2015 FINDINGS: The bony pelvis is subjectively adequately mineralized. There is no lytic or blastic lesion nor acute pelvic fracture observed. The observed portions of the sacrum are normal. There is degenerative disc disease of the lower lumbar spine. AP and lateral views of the right hip reveal preservation of the joint space. The articular surfaces of the femoral head and acetabulum remains smoothly rounded. The femoral neck,  intertrochanteric, and subtrochanteric regions are normal. IMPRESSION: There is no acute or significant chronic bony abnormality of the right hip. Electronically Signed   By: David  Martinique M.D.   On: 10/27/2016 15:59    Procedures Procedures (including critical care time)  Medications Ordered in ED Medications - No data to display   Initial Impression /  Assessment and Plan / ED Course  I have reviewed the triage vital signs and the nursing notes.  Pertinent labs & imaging results that were available during my care of the patient were reviewed by me and considered in my medical decision making (see chart for details).  Clinical Course    55 y.o. F here with fall 4 days ago while hanging curtains.  No head injury or LOC.  Complains of right shoulder and hip pain.  Exam findings as above-- no acute deformities noted.  She is ambulatory with steady gait.  X-rays negative for acute bony findings.  Will d/c home with symptomatic care.   FU with PCP. Discussed plan with patient, she acknowledged understanding and agreed with plan of care.  Return precautions given for new or worsening symptoms.\  I personally performed the services described in this documentation, which was scribed in my presence. The recorded information has been reviewed and is accurate.  Final Clinical Impressions(s) / ED Diagnoses   Final diagnoses:  Fall, initial encounter  Acute pain of right shoulder  Right hip pain    New Prescriptions New Prescriptions   METHOCARBAMOL (ROBAXIN) 500 MG TABLET    Take 1 tablet (500 mg total) by mouth 2 (two) times daily.   NAPROXEN (NAPROSYN) 500 MG TABLET    Take 1 tablet (500 mg total) by mouth 2 (two) times daily with a meal.     Larene Pickett, PA-C 10/27/16 1610    Leo Grosser, MD 10/27/16 [phone]

## 2016-10-27 NOTE — ED Notes (Signed)
Patient states she had an endometrial ablation so X rays can be done without pregnancy test.  X RAY tech notified and are sending for patient.

## 2016-10-27 NOTE — Discharge Instructions (Signed)
Take the prescribed medication as directed. °Follow-up with your primary care doctor. °Return to the ED for new or worsening symptoms. °

## 2016-10-27 NOTE — ED Triage Notes (Signed)
Pt sts right hip pain after fall 4 days ago pt is ambulatory at present

## 2016-10-27 NOTE — ED Notes (Signed)
Patient taken to XRAY

## 2016-12-31 ENCOUNTER — Ambulatory Visit (INDEPENDENT_AMBULATORY_CARE_PROVIDER_SITE_OTHER): Payer: Medicaid Other | Admitting: Obstetrics

## 2016-12-31 ENCOUNTER — Encounter: Payer: Self-pay | Admitting: Obstetrics

## 2016-12-31 ENCOUNTER — Other Ambulatory Visit (HOSPITAL_COMMUNITY)
Admission: RE | Admit: 2016-12-31 | Discharge: 2016-12-31 | Disposition: A | Discharge status: Home / Self Care | Payer: Medicaid Other | Source: Ambulatory Visit | Attending: Obstetrics | Admitting: Obstetrics

## 2016-12-31 VITALS — BP 143/95 | HR 62 | Wt 212.8 lb

## 2016-12-31 DIAGNOSIS — N719 Inflammatory disease of uterus, unspecified: Secondary | ICD-10-CM | POA: Diagnosis not present

## 2016-12-31 DIAGNOSIS — N760 Acute vaginitis: Secondary | ICD-10-CM

## 2016-12-31 DIAGNOSIS — R102 Pelvic and perineal pain: Secondary | ICD-10-CM

## 2016-12-31 DIAGNOSIS — Z9889 Other specified postprocedural states: Secondary | ICD-10-CM

## 2016-12-31 DIAGNOSIS — B9689 Other specified bacterial agents as the cause of diseases classified elsewhere: Secondary | ICD-10-CM

## 2016-12-31 MED ORDER — DICYCLOMINE HCL 20 MG PO TABS: 20.0000 mg | ORAL_TABLET | Freq: Four times a day (QID) | ORAL | 2 refills | Status: DC

## 2016-12-31 MED ORDER — OXYCODONE HCL 10 MG PO TABS: 10.0000 mg | ORAL_TABLET | Freq: Four times a day (QID) | ORAL | 0 refills | Status: DC | PRN

## 2016-12-31 NOTE — Progress Notes (Signed)
Patient ID: Tracey Manning, female   DOB: [birth year], 56 y.o.   MRN: [id number]  Chief Complaint  Patient presents with  . Follow-up    HPI Tracey Manning is a 56 y.o. female.  Follow up after 4 weeks of Doxy / Flagyl for Endometritis after HTA Ablation.  Pain has resolved.  Now with occasional cramps. HPI  Past Medical History:  Diagnosis Date  . Anxiety   . Arthritis   . Asthma   . Chest pain   . Collagen vascular disease ([place])   . Coronary artery disease   . Depression   . Dyslipidemia   . GERD (gastroesophageal reflux disease)    occ  . Hypertension   . Myocardial infarct [id number]  . Sciatica   . Sleep apnea    no CPAP ordered but using oxygen at bedtime  . Tobacco abuse     Past Surgical History:  Procedure Laterality Date  . CARDIAC CATHETERIZATION  10/09/2003   [facility] [place], [place]) - LAD with 30% prox narrowing, 50% stenosis in mid-portion of PLA; RCA with 40% narrowing proximally ([name], [name])  . CARDIAC CATHETERIZATION  10/10/2007   70% stenosis in first septal perforator branch of LAD, 60-70% narrowing in mid LAD, 20% narrowing in mid AV groove Cfx with 80% diffuse narrowing in small distal marginal, total occlusion of mid RCA with L to R collaterals, 90% stenosis diffusely in prox branch of RCA followed by 70% stenosis in secondary curve & 80% stenosis in small marginal branch ([name])  . CARDIAC CATHETERIZATION  05/28/2008   normal L main, RCA with 100% prox lesion w/distal filling from LAD collaterals, LAD with 20% prox tubular lesion/ 40% mid LAD lesion/previous stent patent ([name])  . CARDIAC CATHETERIZATION  09/15/2009   discrete 100% osital RCA lesion, 50% prox LAD lesion, non-obstructive disease in all coronaries ([name])  . CESAREAN SECTION    . COLONOSCOPY    . COLONOSCOPY W/ POLYPECTOMY    . CORONARY ANGIOPLASTY WITH STENT PLACEMENT  10/31/2007   PCI of distal Cfx with 2.25x68mm Taxus Adam DES, 60% narrowing  of mid LAD ([name])  . CORONARY ANGIOPLASTY WITH STENT PLACEMENT  06/11/2011   PCI of prox-mid LAD with 3x34mm DES Resolute ([name])  . DILATION AND CURETTAGE, DIAGNOSTIC / THERAPEUTIC    . DILITATION & CURRETTAGE/HYSTROSCOPY WITH HYDROTHERMAL ABLATION N/A 04/03/2016   Procedure: DILATATION & CURETTAGE/HYSTEROSCOPY WITH HYDROTHERMAL ABLATION;  Surgeon: [name], MD;  Location: [facility];  Service: Gynecology;  Laterality: N/A;  . NM MYOCAR PERF WALL MOTION  08/2009   persantine myoview - normal perfusion in all regions, perfusion defect in anterior region (breast attenuation), EF 52%, low risk scan    Family History  Problem Relation Age of Onset  . Leukemia Mother   . Clotting disorder Father     blood clot  . Hypertension Sister   . Diabetes Sister   . Stroke Sister 37  . Bleeding Disorder Son     ITP "free bleeding disorder"    Social History Social History  Substance Use Topics  . Smoking status: Current Every Day Smoker    Packs/day: 0.50    Years: 0.00    Types: Cigarettes  . Smokeless tobacco: Never Used  . Alcohol use No     Comment: seldom    Allergies  Allergen Reactions  . Other Anaphylaxis and Hives    Fresh strawberries (throat swelled)  . Amoxicillin  Nausea And Vomiting  . Dilaudid [Hydromorphone Hcl] Nausea And Vomiting  . Latex Swelling    No reaction with bandaids    Current Outpatient Prescriptions  Medication Sig Dispense Refill  . albuterol (PROVENTIL HFA;VENTOLIN HFA) 108 (90 BASE) MCG/ACT inhaler Inhale 2 puffs into the lungs every 6 (six) hours as needed for wheezing or shortness of breath.    Marland Kitchen amLODipine (NORVASC) 10 MG tablet Take 10 mg by mouth daily.    Marland Kitchen aspirin EC 325 MG tablet Take 325 mg by mouth daily.    Marland Kitchen atorvastatin (LIPITOR) 40 MG tablet Take 1 tablet (40 mg total) by mouth daily. 30 tablet 2  . clopidogrel (PLAVIX) 75 MG tablet Take 1 tablet (75 mg total) by mouth daily with breakfast. 30 tablet 2  . dicyclomine  (BENTYL) 20 MG tablet Take 1 tablet (20 mg total) by mouth every 6 (six) hours. 30 tablet 2  . doxycycline (VIBRA-TABS) 100 MG tablet Take 1 tablet (100 mg total) by mouth 2 (two) times daily. 28 tablet 1  . erythromycin ophthalmic ointment Place 1 application into the left eye 3 (three) times daily. Place 1/2 inch ribbon of ointment in the affected eye 4 times a day 1 g 1  . fluticasone-salmeterol (ADVAIR HFA) 115-21 MCG/ACT inhaler Inhale 2 puffs into the lungs 2 (two) times daily. 1 Inhaler 12  . lisinopril-hydrochlorothiazide (PRINZIDE,ZESTORETIC) 20-12.5 MG per tablet Take 2 tablets by mouth daily.    . methocarbamol (ROBAXIN) 500 MG tablet Take 1 tablet (500 mg total) by mouth 2 (two) times daily. 20 tablet 0  . metroNIDAZOLE (FLAGYL) 500 MG tablet Take 1 tablet (500 mg total) by mouth 2 (two) times daily. 28 tablet 1  . naproxen (NAPROSYN) 500 MG tablet Take 1 tablet (500 mg total) by mouth 2 (two) times daily with a meal. 30 tablet 0  . nitroGLYCERIN (NITROSTAT) 0.4 MG SL tablet Place 1 tablet (0.4 mg total) under the tongue every 5 (five) minutes as needed for chest pain. 25 tablet 6  . Oxycodone HCl 10 MG TABS Take 1 tablet (10 mg total) by mouth every 6 (six) hours as needed. 30 tablet 0  . sertraline (ZOLOFT) 50 MG tablet Take 50 mg by mouth daily.    Marland Kitchen zolpidem (AMBIEN) 10 MG tablet Take 10 mg by mouth at bedtime as needed for sleep.     No current facility-administered medications for this visit.     Review of Systems Review of Systems Constitutional: negative for fatigue and weight loss Respiratory: negative for cough and wheezing Cardiovascular: negative for chest pain, fatigue and palpitations Gastrointestinal: positive for occasional abdominal pain, but no change in bowel habits Genitourinary:positive for occasional cramping Integument/breast: negative for nipple discharge Musculoskeletal:negative for myalgias Neurological: negative for gait problems and  tremors Behavioral/Psych: negative for abusive relationship, depression Endocrine: negative for temperature intolerance      Blood pressure (!) 143/95, pulse 62, weight 212 lb 12.8 oz (96.5 kg).  Physical Exam Physical Exam General:   alert  Skin:   no rash or abnormalities  Lungs:   clear to auscultation bilaterally  Heart:   regular rate and rhythm, S1, S2 normal, no murmur, click, rub or gallop  Breasts:   normal without suspicious masses, skin or nipple changes or axillary nodes  Abdomen:  normal findings: no organomegaly, soft, non-tender and no hernia  Pelvis:  External genitalia: normal general appearance Urinary system: urethral meatus normal and bladder without fullness, nontender Vaginal: normal without tenderness, induration or  masses Cervix: normal appearance Adnexa: normal bimanual exam Uterus: anteverted and non-tender, normal size    50% of 15 min visit spent on counseling and coordination of care.    Data Reviewed Wet prep  Assessment     H/O pelvic pain from endometritis after HTA Endometrial Ablation.   S/P 4 weeks of p.o. Doxy / Flagyl.  Pain resolved.  May now have occasional GI spasm.    Plan    Wet prep and cultures done Bentyl Rx for GI spasm F/U in 6 months for pap and annual  No orders of the defined types were placed in this encounter.  Meds ordered this encounter  Medications  . DISCONTD: dicyclomine (BENTYL) 20 MG tablet    Sig: Take 1 tablet (20 mg total) by mouth every 6 (six) hours.    Dispense:  30 tablet    Refill:  2  . dicyclomine (BENTYL) 20 MG tablet    Sig: Take 1 tablet (20 mg total) by mouth every 6 (six) hours.    Dispense:  30 tablet    Refill:  2  . Oxycodone HCl 10 MG TABS    Sig: Take 1 tablet (10 mg total) by mouth every 6 (six) hours as needed.    Dispense:  30 tablet    Refill:  0

## 2016-12-31 NOTE — Progress Notes (Signed)
Patient is here to follow up on Ablation done 03/2016. She complains of having a headache for the last 3 days. She has an appointment with her PCP 01/06/2017

## 2017-01-01 LAB — CERVICOVAGINAL ANCILLARY ONLY
BACTERIAL VAGINITIS: NEGATIVE
Candida vaginitis: NEGATIVE
Chlamydia: NEGATIVE
NEISSERIA GONORRHEA: NEGATIVE
TRICH (WINDOWPATH): NEGATIVE

## 2017-02-08 ENCOUNTER — Telehealth: Payer: Self-pay | Admitting: *Deleted

## 2017-02-08 DIAGNOSIS — B373 Candidiasis of vulva and vagina: Secondary | ICD-10-CM

## 2017-02-08 DIAGNOSIS — B3731 Acute candidiasis of vulva and vagina: Secondary | ICD-10-CM

## 2017-02-08 MED ORDER — TERCONAZOLE 0.8 % VA CREA: 1.0000 | TOPICAL_CREAM | Freq: Every day | VAGINAL | 0 refills | Status: DC

## 2017-02-08 NOTE — Telephone Encounter (Signed)
Patient is itching and she needs the vaginal cream.Patient is sure she has yeast. She prefers vaginal treatment. Rx sent per protocol- patient to call if her symptoms do not improve.

## 2017-02-09 ENCOUNTER — Ambulatory Visit: Payer: Medicaid Other | Admitting: Cardiovascular Disease

## 2017-02-12 ENCOUNTER — Ambulatory Visit (INDEPENDENT_AMBULATORY_CARE_PROVIDER_SITE_OTHER): Payer: Medicaid Other | Admitting: Cardiovascular Disease

## 2017-02-12 ENCOUNTER — Encounter: Payer: Self-pay | Admitting: Cardiovascular Disease

## 2017-02-12 VITALS — BP 170/110 | HR 73 | Ht 61.0 in | Wt 214.8 lb

## 2017-02-12 DIAGNOSIS — I2583 Coronary atherosclerosis due to lipid rich plaque: Secondary | ICD-10-CM | POA: Diagnosis not present

## 2017-02-12 DIAGNOSIS — I10 Essential (primary) hypertension: Secondary | ICD-10-CM

## 2017-02-12 DIAGNOSIS — I2510 Atherosclerotic heart disease of native coronary artery without angina pectoris: Secondary | ICD-10-CM

## 2017-02-12 NOTE — Progress Notes (Signed)
02/12/2017 Tracey Manning   December 15, 1960  [id number]  Primary Physician [name], MD Primary Cardiologist: [name] MD Renae Gloss  HPI:   Tracey Manning is a 56 year old moderately overweight single African American female mother of 2 children, grandmother and 2 grandchildren who currently is out of work on [employer] and disability. I last saw her in the office 01/29/16.She was referred by Dr. Shawna Orleans for cardiovascular evaluation because of chest pain. Her cardiac risk factor profile is positive for 20-pack-years of tobacco abuse currently smoking one pack per day, treated type of pressure and high cholesterol. She has had multiple heart attacks in the past dating back to 2004. She had a stent placed in her circumflex by Dr. Einar Gip December 2008 (Taxus 2.5 x 24 mm). She had a Medtronic stent placed in her proximal LAD by Dr. Claiborne Billings 06/11/11. Over the last 6 months she has developed recurrent chest pain as well as dyspnea on exertion. Since I saw her on 04/04/14 I obtained a Myoview stress test which was normal and a 2-D echo which revealed normal LV function. Since I saw her a year ago, she's done well clinically. She gets occasional chest pain which has not changed in frequency or severity. Her blood pressure is somewhat elevated due to back and abdominal pain however.   Current Outpatient Prescriptions  Medication Sig Dispense Refill  . albuterol (PROVENTIL HFA;VENTOLIN HFA) 108 (90 BASE) MCG/ACT inhaler Inhale 2 puffs into the lungs every 6 (six) hours as needed for wheezing or shortness of breath.    Marland Kitchen amLODipine (NORVASC) 10 MG tablet Take 10 mg by mouth daily.    Marland Kitchen aspirin EC 325 MG tablet Take 325 mg by mouth daily.    Marland Kitchen atorvastatin (LIPITOR) 40 MG tablet Take 1 tablet (40 mg total) by mouth daily. 30 tablet 2  . clopidogrel (PLAVIX) 75 MG tablet Take 1 tablet (75 mg total) by mouth daily with breakfast. 30 tablet 2  . dicyclomine (BENTYL) 20 MG tablet Take  1 tablet (20 mg total) by mouth every 6 (six) hours. 30 tablet 2  . doxycycline (VIBRA-TABS) 100 MG tablet Take 1 tablet (100 mg total) by mouth 2 (two) times daily. 28 tablet 1  . erythromycin ophthalmic ointment Place 1 application into the left eye 3 (three) times daily. Place 1/2 inch ribbon of ointment in the affected eye 4 times a day 1 g 1  . fluticasone-salmeterol (ADVAIR HFA) 115-21 MCG/ACT inhaler Inhale 2 puffs into the lungs 2 (two) times daily. 1 Inhaler 12  . lisinopril-hydrochlorothiazide (PRINZIDE,ZESTORETIC) 20-12.5 MG per tablet Take 2 tablets by mouth daily.    . methocarbamol (ROBAXIN) 500 MG tablet Take 1 tablet (500 mg total) by mouth 2 (two) times daily. 20 tablet 0  . metroNIDAZOLE (FLAGYL) 500 MG tablet Take 1 tablet (500 mg total) by mouth 2 (two) times daily. 28 tablet 1  . naproxen (NAPROSYN) 500 MG tablet Take 1 tablet (500 mg total) by mouth 2 (two) times daily with a meal. 30 tablet 0  . nitroGLYCERIN (NITROSTAT) 0.4 MG SL tablet Place 1 tablet (0.4 mg total) under the tongue every 5 (five) minutes as needed for chest pain. 25 tablet 6  . Oxycodone HCl 10 MG TABS Take 1 tablet (10 mg total) by mouth every 6 (six) hours as needed. 30 tablet 0  . sertraline (ZOLOFT) 50 MG tablet Take 50 mg by mouth daily.    Marland Kitchen terconazole (TERAZOL 3) 0.8 %  vaginal cream Place 1 applicator vaginally at bedtime. 20 g 0  . zolpidem (AMBIEN) 10 MG tablet Take 10 mg by mouth at bedtime as needed for sleep.     No current facility-administered medications for this visit.     Allergies  Allergen Reactions  . Other Anaphylaxis and Hives    Fresh strawberries (throat swelled)  . Amoxicillin Nausea And Vomiting  . Dilaudid [Hydromorphone Hcl] Nausea And Vomiting  . Latex Swelling    No reaction with bandaids    Social History   Social History  . Marital status: Single    Spouse name: N/A  . Number of children: 2  . Years of education: 12   Occupational History  . Not on file.    Social History Main Topics  . Smoking status: Current Every Day Smoker    Packs/day: 0.50    Years: 0.00    Types: Cigarettes  . Smokeless tobacco: Never Used  . Alcohol use No     Comment: seldom  . Drug use: No     Comment: Occassionally, last time April 2017  . Sexual activity: Yes    Partners: Male    Birth control/ protection: Post-menopausal   Other Topics Concern  . Not on file   Social History Narrative  . No narrative on file     Review of Systems: General: negative for chills, fever, night sweats or weight changes.  Cardiovascular: negative for chest pain, dyspnea on exertion, edema, orthopnea, palpitations, paroxysmal nocturnal dyspnea or shortness of breath Dermatological: negative for rash Respiratory: negative for cough or wheezing Urologic: negative for hematuria Abdominal: negative for nausea, vomiting, diarrhea, bright red blood per rectum, melena, or hematemesis Neurologic: negative for visual changes, syncope, or dizziness All other systems reviewed and are otherwise negative except as noted above.    Blood pressure (!) 170/110, pulse 73, height 5\' 1"  (1.549 m), weight 214 lb 12.8 oz (97.4 kg).  General appearance: alert and no distress Neck: no adenopathy, no carotid bruit, no JVD, supple, symmetrical, trachea midline and thyroid not enlarged, symmetric, no tenderness/mass/nodules Lungs: clear to auscultation bilaterally Heart: regular rate and rhythm, S1, S2 normal, no murmur, click, rub or gallop Extremities: extremities normal, atraumatic, no cyanosis or edema  EKG sinus rhythm at 73 nonspecific ST-T wave changes. Occasional PVCs. I personally reviewed this EKG.  ASSESSMENT AND PLAN:   Essential hypertension History of hypertension with blood pressure today 170/110. She is on amlodipine and Zestoretic. She is in a lot of pain from her back probably contributing to her hypertension.  Hyperlipidemia History of hyperlipidemia on statin therapy  followed by her PCP  Coronary artery disease History of coronary artery disease status post multiple heart attacks in the past dating back 2004. She has been placed in her circumflex by Dr. Nadyne Coombes December 2008 (Taxus 2.5 x 24 mm). She had a Medtronic stent placed by Dr. Claiborne Billings in her proximal LAD 06/11/11. Her last Myoview performed 04/17/14 was nonischemic. She does get occasional chest pain which has not changed in frequency or severity.      [name] MD FACP,FACC,FAHA, [facility] 02/12/2017 3:47 PM

## 2017-02-12 NOTE — Assessment & Plan Note (Signed)
History of coronary artery disease status post multiple heart attacks in the past dating back 2004. She has been placed in her circumflex by Dr. Nadyne Coombes December 2008 (Taxus 2.5 x 24 mm). She had a Medtronic stent placed by Dr. Claiborne Billings in her proximal LAD 06/11/11. Her last Myoview performed 04/17/14 was nonischemic. She does get occasional chest pain which has not changed in frequency or severity.

## 2017-02-12 NOTE — Assessment & Plan Note (Signed)
History of hyperlipidemia on statin therapy followed by her PCP. 

## 2017-02-12 NOTE — Patient Instructions (Signed)
Medication Instructions:  Your physician recommends that you continue on your current medications as directed. Please refer to the Current Medication list given to you today.  Labwork: None   Testing/Procedures: None   Follow-Up: Your physician wants you to follow-up in: [place]. You will receive a reminder letter in the mail two months in advance. If you don't receive a letter, please call our office to schedule the follow-up appointment.  Any Other Special Instructions Will Be Listed Below (If Applicable).  If you need a refill on your cardiac medications before your next appointment, please call your pharmacy.

## 2017-02-12 NOTE — Assessment & Plan Note (Signed)
History of hypertension with blood pressure today 170/110. She is on amlodipine and Zestoretic. She is in a lot of pain from her back probably contributing to her hypertension.

## 2017-02-15 ENCOUNTER — Emergency Department (HOSPITAL_COMMUNITY)
Admission: EM | Admit: 2017-02-15 | Discharge: 2017-02-15 | Disposition: A | Discharge status: Home / Self Care | Payer: Medicaid Other | Attending: Emergency Medicine | Admitting: Emergency Medicine

## 2017-02-15 ENCOUNTER — Encounter (HOSPITAL_COMMUNITY): Payer: Self-pay

## 2017-02-15 DIAGNOSIS — Z7982 Long term (current) use of aspirin: Secondary | ICD-10-CM | POA: Insufficient documentation

## 2017-02-15 DIAGNOSIS — I2510 Atherosclerotic heart disease of native coronary artery without angina pectoris: Secondary | ICD-10-CM | POA: Insufficient documentation

## 2017-02-15 DIAGNOSIS — M79661 Pain in right lower leg: Secondary | ICD-10-CM | POA: Insufficient documentation

## 2017-02-15 DIAGNOSIS — I10 Essential (primary) hypertension: Secondary | ICD-10-CM | POA: Insufficient documentation

## 2017-02-15 DIAGNOSIS — M5441 Lumbago with sciatica, right side: Secondary | ICD-10-CM | POA: Insufficient documentation

## 2017-02-15 DIAGNOSIS — I252 Old myocardial infarction: Secondary | ICD-10-CM | POA: Diagnosis not present

## 2017-02-15 DIAGNOSIS — Z955 Presence of coronary angioplasty implant and graft: Secondary | ICD-10-CM | POA: Insufficient documentation

## 2017-02-15 DIAGNOSIS — Z91040 Latex allergy status: Secondary | ICD-10-CM | POA: Diagnosis not present

## 2017-02-15 DIAGNOSIS — Z79899 Other long term (current) drug therapy: Secondary | ICD-10-CM | POA: Diagnosis not present

## 2017-02-15 DIAGNOSIS — J45909 Unspecified asthma, uncomplicated: Secondary | ICD-10-CM | POA: Insufficient documentation

## 2017-02-15 DIAGNOSIS — F17210 Nicotine dependence, cigarettes, uncomplicated: Secondary | ICD-10-CM | POA: Insufficient documentation

## 2017-02-15 MED ORDER — MORPHINE SULFATE (PF) 4 MG/ML IV SOLN
4.0000 mg | Freq: Once | INTRAVENOUS | Status: AC
  Administered 2017-02-15: 4 mg via INTRAMUSCULAR
  Filled 2017-02-15: qty 1

## 2017-02-15 MED ORDER — DEXAMETHASONE SODIUM PHOSPHATE 10 MG/ML IJ SOLN
10.0000 mg | Freq: Once | INTRAMUSCULAR | Status: AC
  Administered 2017-02-15: 10 mg via INTRAMUSCULAR
  Filled 2017-02-15: qty 1

## 2017-02-15 MED ORDER — METHOCARBAMOL 500 MG PO TABS: 500.0000 mg | ORAL_TABLET | Freq: Two times a day (BID) | ORAL | 0 refills | Status: DC

## 2017-02-15 NOTE — Discharge Instructions (Signed)
Please read and follow all provided instructions.  Your diagnoses today include:  1. Acute right-sided low back pain with right-sided sciatica     Tests performed today include: Vital signs - see below for your results today  Medications prescribed:   Take any prescribed medications only as directed.  Home care instructions:  Follow any educational materials contained in this packet Please rest, use ice or heat on your back for the next several days Do not lift, push, pull anything more than 10 pounds for the next week  Follow-up instructions: Please follow-up with your primary care provider in the next 1 week for further evaluation of your symptoms.   Return instructions:  SEEK IMMEDIATE MEDICAL ATTENTION IF YOU HAVE: New numbness, tingling, weakness, or problem with the use of your arms or legs Severe back pain not relieved with medications Loss control of your bowels or bladder Increasing pain in any areas of the body (such as chest or abdominal pain) Shortness of breath, dizziness, or fainting.  Worsening nausea (feeling sick to your stomach), vomiting, fever, or sweats Any other emergent concerns regarding your health   Additional Information:  Your vital signs today were: BP (!) 158/103 (BP Location: Right Arm)    Pulse 71    Temp 97.8 F (36.6 C) (Oral)    Resp 18    LMP  (LMP Unknown) Comment: verified BEFORE imaging   SpO2 98%  If your blood pressure (BP) was elevated above 135/85 this visit, please have this repeated by your doctor within one month. --------------

## 2017-02-15 NOTE — ED Notes (Addendum)
Pt in restroom 

## 2017-02-15 NOTE — ED Provider Notes (Signed)
[facility] DEPT Provider Note   CSN: [id number] Arrival date & time: 02/15/17  1719     History   Chief Complaint Chief Complaint  Patient presents with  . Sciatica    HPI Tracey Manning is a 56 y.o. female.  HPI  56 y.o. female with a hx of Asthma, HTN, presents to the Emergency Department today from PCP office due to sciatic back pain with uncontrolled pain. Notes pain on right side. Hx same. Has been ongoing x 1 month. No loss of bowel or bladder function. No saddle anesthesia. No numbness/tingling. Notes pain radiates from lower lumbar spine to toes. Rates pain 10/10. No fevers. No N/V. No CP/SOB/ABD pain. Also notes right calf pain x 1 month. No hx DVT. PT not on anticoagulation. No other symptoms noted.   Past Medical History:  Diagnosis Date  . Anxiety   . Arthritis   . Asthma   . Chest pain   . Collagen vascular disease ([place])   . Coronary artery disease   . Depression   . Dyslipidemia   . GERD (gastroesophageal reflux disease)    occ  . Hypertension   . Myocardial infarct 4128,7867  . Sciatica   . Sleep apnea    no CPAP ordered but using oxygen at bedtime  . Tobacco abuse     Patient Active Problem List   Diagnosis Date Noted  . Abnormal uterine bleeding (AUB) 04/03/2016  . Chest pain syndrome 11/14/2015  . Dysfunctional uterine bleeding 11/14/2015  . Right sided abdominal pain 11/14/2015  . Right anterior shoulder pain 11/14/2015  . Pelvic pain in female 11/14/2015  . Bacterial vaginosis (recurrent) 11/14/2015  . Chest pain 11/14/2015  . Essential hypertension 04/04/2014  . Hyperlipidemia 04/04/2014  . Coronary artery disease 04/04/2014    Past Surgical History:  Procedure Laterality Date  . CARDIAC CATHETERIZATION  10/09/2003   [place], [place]) - LAD with 30% prox narrowing, 50% stenosis in mid-portion of PLA; RCA with 40% narrowing proximally ([name], [name])  . CARDIAC CATHETERIZATION  10/10/2007   70% stenosis in  first septal perforator branch of LAD, 60-70% narrowing in mid LAD, 20% narrowing in mid AV groove Cfx with 80% diffuse narrowing in small distal marginal, total occlusion of mid RCA with L to R collaterals, 90% stenosis diffusely in prox branch of RCA followed by 70% stenosis in secondary curve & 80% stenosis in small marginal branch ([name])  . CARDIAC CATHETERIZATION  05/28/2008   normal L main, RCA with 100% prox lesion w/distal filling from LAD collaterals, LAD with 20% prox tubular lesion/ 40% mid LAD lesion/previous stent patent ([name])  . CARDIAC CATHETERIZATION  09/15/2009   discrete 100% osital RCA lesion, 50% prox LAD lesion, non-obstructive disease in all coronaries ([name])  . CESAREAN SECTION    . COLONOSCOPY    . COLONOSCOPY W/ POLYPECTOMY    . CORONARY ANGIOPLASTY WITH STENT PLACEMENT  10/31/2007   PCI of distal Cfx with 2.25x89mm Taxus Adam DES, 60% narrowing of mid LAD ([name])  . CORONARY ANGIOPLASTY WITH STENT PLACEMENT  06/11/2011   PCI of prox-mid LAD with 3x38mm DES Resolute ([name])  . DILATION AND CURETTAGE, DIAGNOSTIC / THERAPEUTIC    . DILITATION & CURRETTAGE/HYSTROSCOPY WITH HYDROTHERMAL ABLATION N/A 04/03/2016   Procedure: DILATATION & CURETTAGE/HYSTEROSCOPY WITH HYDROTHERMAL ABLATION;  Surgeon: [name], MD;  Location: [facility];  Service: Gynecology;  Laterality: N/A;  . NM MYOCAR PERF WALL  MOTION  08/2009   persantine myoview - normal perfusion in all regions, perfusion defect in anterior region (breast attenuation), EF 52%, low risk scan    OB History    No data available       Home Medications    Prior to Admission medications   Medication Sig Start Date End Date Taking? Authorizing Provider  albuterol (PROVENTIL HFA;VENTOLIN HFA) 108 (90 BASE) MCG/ACT inhaler Inhale 2 puffs into the lungs every 6 (six) hours as needed for wheezing or shortness of breath.    Historical Provider, MD  amLODipine (NORVASC) 10 MG tablet Take  10 mg by mouth daily.    Historical Provider, MD  aspirin EC 325 MG tablet Take 325 mg by mouth daily.    Historical Provider, MD  atorvastatin (LIPITOR) 40 MG tablet Take 1 tablet (40 mg total) by mouth daily. 01/04/14   [name], MD  clopidogrel (PLAVIX) 75 MG tablet Take 1 tablet (75 mg total) by mouth daily with breakfast. 01/04/14   [name], MD  dicyclomine (BENTYL) 20 MG tablet Take 1 tablet (20 mg total) by mouth every 6 (six) hours. 12/31/16   [name], MD  doxycycline (VIBRA-TABS) 100 MG tablet Take 1 tablet (100 mg total) by mouth 2 (two) times daily. 09/15/16   [name], MD  erythromycin ophthalmic ointment Place 1 application into the left eye 3 (three) times daily. Place 1/2 inch ribbon of ointment in the affected eye 4 times a day 09/11/16   Noemi Chapel, MD  fluticasone-salmeterol (ADVAIR [facility]) [phone] MCG/ACT inhaler Inhale 2 puffs into the lungs 2 (two) times daily. 01/04/14   [name], MD  lisinopril-hydrochlorothiazide (PRINZIDE,ZESTORETIC) 20-12.5 MG per tablet Take 2 tablets by mouth daily.    Historical Provider, MD  methocarbamol (ROBAXIN) 500 MG tablet Take 1 tablet (500 mg total) by mouth 2 (two) times daily. 10/27/16   Larene Pickett, PA-C  metroNIDAZOLE (FLAGYL) 500 MG tablet Take 1 tablet (500 mg total) by mouth 2 (two) times daily. 09/15/16   [name], MD  naproxen (NAPROSYN) 500 MG tablet Take 1 tablet (500 mg total) by mouth 2 (two) times daily with a meal. 10/27/16   Larene Pickett, PA-C  nitroGLYCERIN (NITROSTAT) 0.4 MG SL tablet Place 1 tablet (0.4 mg total) under the tongue every 5 (five) minutes as needed for chest pain. 06/05/15   [name], MD  Oxycodone HCl 10 MG TABS Take 1 tablet (10 mg total) by mouth every 6 (six) hours as needed. 12/31/16   [name], MD  sertraline (ZOLOFT) 50 MG tablet Take 50 mg by mouth daily.    Historical Provider, MD  terconazole (TERAZOL 3) 0.8 % vaginal cream Place 1 applicator vaginally  at bedtime. 02/08/17   [name], MD  zolpidem (AMBIEN) 10 MG tablet Take 10 mg by mouth at bedtime as needed for sleep.    Historical Provider, MD    Family History Family History  Problem Relation Age of Onset  . Leukemia Mother   . Clotting disorder Father     blood clot  . Hypertension Sister   . Diabetes Sister   . Stroke Sister 34  . Bleeding Disorder Son     ITP "free bleeding disorder"    Social History Social History  Substance Use Topics  . Smoking status: Current Every Day Smoker    Packs/day: 0.50    Years: 0.00    Types: Cigarettes  .  Smokeless tobacco: Never Used  . Alcohol use No     Comment: seldom     Allergies   Other; Amoxicillin; Dilaudid [hydromorphone hcl]; and Latex   Review of Systems Review of Systems ROS reviewed and all are negative for acute change except as noted in the HPI.  Physical Exam Updated Vital Signs BP (!) 158/103 (BP Location: Right Arm)   Pulse 71   Temp 97.8 F (36.6 C) (Oral)   Resp 18   LMP  (LMP Unknown) Comment: verified BEFORE imaging  SpO2 98%   Physical Exam  Constitutional: She is oriented to person, place, and time. Vital signs are normal. She appears well-developed and well-nourished.  HENT:  Head: Normocephalic and atraumatic.  Right Ear: Hearing normal.  Left Ear: Hearing normal.  Eyes: Conjunctivae and EOM are normal. Pupils are equal, round, and reactive to light.  Neck: Normal range of motion. Neck supple.  Cardiovascular: Normal rate, regular rhythm, normal heart sounds and intact distal pulses.   Pulmonary/Chest: Effort normal and breath sounds normal.  Abdominal: There is no tenderness.  Musculoskeletal: Normal range of motion.  TTP Right lower lumbar musculature. Also TTP along posterior right calf. Neg homas sign.   Neurological: She is alert and oriented to person, place, and time.  Skin: Skin is warm and dry.  Psychiatric: She has a normal mood and affect. Her speech is normal and  behavior is normal. Thought content normal.  Nursing note and vitals reviewed.  ED Treatments / Results  Labs (all labs ordered are listed, but only abnormal results are displayed) Labs Reviewed - No data to display  EKG  EKG Interpretation None       Radiology No results found.  Procedures Procedures (including critical care time)  Medications Ordered in ED Medications - No data to display   Initial Impression / Assessment and Plan / ED Course  I have reviewed the triage vital signs and the nursing notes.  Pertinent labs & imaging results that were available during my care of the patient were reviewed by me and considered in my medical decision making (see chart for details).  Final Clinical Impressions(s) / ED Diagnoses     {I have reviewed the relevant previous healthcare records.   {Patient discussed with supervising physician.  ED Course:  Assessment: Pt is a 56 y.o. female with hx Asthma, HTN, presents to the Emergency Department today from PCP office due to sciatic back pain with uncontrolled pain. Notes pain on right side. Hx same. Has been ongoing x 1 month. No loss of bowel or bladder function. No saddle anesthesia. No numbness/tingling. Notes pain radiates from lower lumbar spine to toes.. On exam, pt in NAD. Nontoxic/nonseptic appearing. VSS. Afebrile. Lungs CTA. Heart RRR. Abdomen nontender soft. BLE exam unremarkable. Motor/sensation intact. Calf tenderness relieved with muscle relaxant. Doubt DVT, but will ordered ambulatory referral ultrasound. Pt with Rx medrol. Given robaxin as well as decadron in ED. Plan is to DC home with follow up to PCPD. At time of discharge, Patient is in no acute distress. Vital Signs are stable. Patient is able to ambulate. Patient able to tolerate PO.   Disposition/Plan:  DC Home Additional Verbal discharge instructions given and discussed with patient.  Pt Instructed to f/u with PCP in the next week for evaluation and treatment of  symptoms. Return precautions given Pt acknowledges and agrees with plan  Supervising Physician Daleen Bo, MD  Final diagnoses:  Acute right-sided low back pain with right-sided sciatica  Right  calf pain    New Prescriptions New Prescriptions   No medications on file     Shary Decamp, PA-C 02/15/17 2143    Daleen Bo, MD 02/16/17 1011

## 2017-02-15 NOTE — ED Notes (Signed)
PTS ACUITY MADE A "3" BASED ON BP OF 203/144 AND LEVEL OF PAIN.

## 2017-02-15 NOTE — ED Triage Notes (Signed)
Pt reports sciatica exacerbation that started one month ago but the pain worsened today. She has prescription for percocet and took it today but it has not helped. Pt very tearful and sobbing in pain at triage.

## 2017-02-16 ENCOUNTER — Ambulatory Visit (HOSPITAL_COMMUNITY)
Admission: RE | Admit: 2017-02-16 | Discharge: 2017-02-16 | Disposition: A | Discharge status: Home / Self Care | Payer: Medicaid Other | Source: Ambulatory Visit | Attending: Physician Assistant | Admitting: Physician Assistant

## 2017-02-16 DIAGNOSIS — M79609 Pain in unspecified limb: Secondary | ICD-10-CM

## 2017-02-16 DIAGNOSIS — M79661 Pain in right lower leg: Secondary | ICD-10-CM | POA: Insufficient documentation

## 2017-02-16 NOTE — Progress Notes (Signed)
**  Preliminary report by tech**  Right lower extremity venous duplex complete. There is no evidence of deep or superficial vein thrombosis involving the right lower extremity. All visualized vessels appear patent and compressible. There is no evidence of a Baker's cyst on the right.  02/16/17 8:57 AM Tracey Manning RVT

## 2017-03-03 ENCOUNTER — Encounter (HOSPITAL_COMMUNITY): Payer: Self-pay

## 2017-03-03 ENCOUNTER — Emergency Department (HOSPITAL_COMMUNITY)
Admission: EM | Admit: 2017-03-03 | Discharge: 2017-03-03 | Disposition: A | Discharge status: Home / Self Care | Payer: Medicaid Other | Attending: Emergency Medicine | Admitting: Emergency Medicine

## 2017-03-03 DIAGNOSIS — I10 Essential (primary) hypertension: Secondary | ICD-10-CM | POA: Diagnosis not present

## 2017-03-03 DIAGNOSIS — Z955 Presence of coronary angioplasty implant and graft: Secondary | ICD-10-CM | POA: Diagnosis not present

## 2017-03-03 DIAGNOSIS — I2510 Atherosclerotic heart disease of native coronary artery without angina pectoris: Secondary | ICD-10-CM | POA: Insufficient documentation

## 2017-03-03 DIAGNOSIS — Z7982 Long term (current) use of aspirin: Secondary | ICD-10-CM | POA: Insufficient documentation

## 2017-03-03 DIAGNOSIS — J45909 Unspecified asthma, uncomplicated: Secondary | ICD-10-CM | POA: Diagnosis not present

## 2017-03-03 DIAGNOSIS — Z91040 Latex allergy status: Secondary | ICD-10-CM | POA: Insufficient documentation

## 2017-03-03 DIAGNOSIS — I252 Old myocardial infarction: Secondary | ICD-10-CM | POA: Insufficient documentation

## 2017-03-03 DIAGNOSIS — M5431 Sciatica, right side: Secondary | ICD-10-CM | POA: Insufficient documentation

## 2017-03-03 DIAGNOSIS — F17210 Nicotine dependence, cigarettes, uncomplicated: Secondary | ICD-10-CM | POA: Insufficient documentation

## 2017-03-03 DIAGNOSIS — M79604 Pain in right leg: Secondary | ICD-10-CM | POA: Diagnosis present

## 2017-03-03 MED ORDER — HALOPERIDOL LACTATE 5 MG/ML IJ SOLN
2.0000 mg | Freq: Once | INTRAMUSCULAR | Status: AC
  Administered 2017-03-03: 2 mg via INTRAVENOUS
  Filled 2017-03-03: qty 1

## 2017-03-03 MED ORDER — MORPHINE SULFATE (PF) 4 MG/ML IV SOLN: 6.0000 mg | Freq: Once | INTRAVENOUS | Status: DC

## 2017-03-03 MED ORDER — HALOPERIDOL LACTATE 5 MG/ML IJ SOLN: 2.0000 mg | Freq: Once | INTRAMUSCULAR | Status: DC

## 2017-03-03 MED ORDER — KETOROLAC TROMETHAMINE 15 MG/ML IJ SOLN
15.0000 mg | Freq: Once | INTRAMUSCULAR | Status: AC
  Administered 2017-03-03: 15 mg via INTRAVENOUS
  Filled 2017-03-03: qty 1

## 2017-03-03 MED ORDER — DEXAMETHASONE SODIUM PHOSPHATE 10 MG/ML IJ SOLN
10.0000 mg | Freq: Once | INTRAMUSCULAR | Status: AC
  Administered 2017-03-03: 10 mg via INTRAVENOUS
  Filled 2017-03-03: qty 1

## 2017-03-03 MED ORDER — MORPHINE SULFATE (PF) 4 MG/ML IV SOLN
8.0000 mg | Freq: Once | INTRAVENOUS | Status: AC
  Administered 2017-03-03: 8 mg via INTRAVENOUS
  Filled 2017-03-03: qty 2

## 2017-03-03 NOTE — ED Triage Notes (Signed)
Pt states that her sciatic of her R leg is acting up and her pain medication at home is not working, pt states that EMS gave her 50 mcg of fentanyl and that only lasted 10 min and she needs something else and she wants a MRI.

## 2017-03-03 NOTE — ED Notes (Signed)
Pt called in lobby 4th time

## 2017-03-03 NOTE — ED Notes (Signed)
Called Pt X3

## 2017-03-11 NOTE — ED Provider Notes (Signed)
[facility] DEPT Provider Note   CSN: [id number] Arrival date & time: 03/03/17  1841     History   Chief Complaint Chief Complaint  Patient presents with  . Sciatica    HPI Tracey Manning is a 56 y.o. female.  HPI  54yF with back/R leg pain. Chronic. Worse in the last few days. Pain meds not working. No numbness, tinlging or loss of strength. No urinary complaints. Pain worse with movement/walking.   Past Medical History:  Diagnosis Date  . Anxiety   . Arthritis   . Asthma   . Chest pain   . Collagen vascular disease ([place])   . Coronary artery disease   . Depression   . Dyslipidemia   . GERD (gastroesophageal reflux disease)    occ  . Hypertension   . Myocardial infarct ([place]) 7619,5093  . Sciatica   . Sleep apnea    no CPAP ordered but using oxygen at bedtime  . Tobacco abuse     Patient Active Problem List   Diagnosis Date Noted  . Abnormal uterine bleeding (AUB) 04/03/2016  . Chest pain syndrome 11/14/2015  . Dysfunctional uterine bleeding 11/14/2015  . Right sided abdominal pain 11/14/2015  . Right anterior shoulder pain 11/14/2015  . Pelvic pain in female 11/14/2015  . Bacterial vaginosis (recurrent) 11/14/2015  . Chest pain 11/14/2015  . Essential hypertension 04/04/2014  . Hyperlipidemia 04/04/2014  . Coronary artery disease 04/04/2014    Past Surgical History:  Procedure Laterality Date  . CARDIAC CATHETERIZATION  10/09/2003   [facility] [place], [place]) - LAD with 30% prox narrowing, 50% stenosis in mid-portion of PLA; RCA with 40% narrowing proximally ([name], [name])  . CARDIAC CATHETERIZATION  10/10/2007   70% stenosis in first septal perforator branch of LAD, 60-70% narrowing in mid LAD, 20% narrowing in mid AV groove Cfx with 80% diffuse narrowing in small distal marginal, total occlusion of mid RCA with L to R collaterals, 90% stenosis diffusely in prox branch of RCA followed by 70% stenosis in secondary curve & 80% stenosis  in small marginal branch ([name])  . CARDIAC CATHETERIZATION  05/28/2008   normal L main, RCA with 100% prox lesion w/distal filling from LAD collaterals, LAD with 20% prox tubular lesion/ 40% mid LAD lesion/previous stent patent ([name])  . CARDIAC CATHETERIZATION  09/15/2009   discrete 100% osital RCA lesion, 50% prox LAD lesion, non-obstructive disease in all coronaries ([name])  . CESAREAN SECTION    . COLONOSCOPY    . COLONOSCOPY W/ POLYPECTOMY    . CORONARY ANGIOPLASTY WITH STENT PLACEMENT  10/31/2007   PCI of distal Cfx with 2.25x38mm Taxus Adam DES, 60% narrowing of mid LAD ([name])  . CORONARY ANGIOPLASTY WITH STENT PLACEMENT  06/11/2011   PCI of prox-mid LAD with 3x4mm DES Resolute ([name])  . DILATION AND CURETTAGE, DIAGNOSTIC / THERAPEUTIC    . DILITATION & CURRETTAGE/HYSTROSCOPY WITH HYDROTHERMAL ABLATION N/A 04/03/2016   Procedure: DILATATION & CURETTAGE/HYSTEROSCOPY WITH HYDROTHERMAL ABLATION;  Surgeon: [name], MD;  Location: [facility];  Service: Gynecology;  Laterality: N/A;  . NM MYOCAR PERF WALL MOTION  08/2009   persantine myoview - normal perfusion in all regions, perfusion defect in anterior region (breast attenuation), EF 52%, low risk scan    OB History    No data available       Home Medications    Prior to Admission medications   Medication Sig Start Date End  Date Taking? Authorizing Provider  albuterol (PROVENTIL HFA;VENTOLIN HFA) 108 (90 BASE) MCG/ACT inhaler Inhale 2 puffs into the lungs every 6 (six) hours as needed for wheezing or shortness of breath.    Historical Provider, MD  amLODipine (NORVASC) 10 MG tablet Take 10 mg by mouth daily.    Historical Provider, MD  aspirin EC 325 MG tablet Take 325 mg by mouth daily.    Historical Provider, MD  atorvastatin (LIPITOR) 40 MG tablet Take 1 tablet (40 mg total) by mouth daily. 01/04/14   [name], MD  clopidogrel (PLAVIX) 75 MG tablet Take 1 tablet (75 mg total) by  mouth daily with breakfast. 01/04/14   [name], MD  dicyclomine (BENTYL) 20 MG tablet Take 1 tablet (20 mg total) by mouth every 6 (six) hours. 12/31/16   [name], MD  doxycycline (VIBRA-TABS) 100 MG tablet Take 1 tablet (100 mg total) by mouth 2 (two) times daily. 09/15/16   [name], MD  erythromycin ophthalmic ointment Place 1 application into the left eye 3 (three) times daily. Place 1/2 inch ribbon of ointment in the affected eye 4 times a day 09/11/16   Noemi Chapel, MD  fluticasone-salmeterol (ADVAIR [facility]) [phone] MCG/ACT inhaler Inhale 2 puffs into the lungs 2 (two) times daily. 01/04/14   [name], MD  lisinopril-hydrochlorothiazide (PRINZIDE,ZESTORETIC) 20-12.5 MG per tablet Take 2 tablets by mouth daily.    Historical Provider, MD  methocarbamol (ROBAXIN) 500 MG tablet Take 1 tablet (500 mg total) by mouth 2 (two) times daily. 02/15/17   Shary Decamp, PA-C  metroNIDAZOLE (FLAGYL) 500 MG tablet Take 1 tablet (500 mg total) by mouth 2 (two) times daily. 09/15/16   [name], MD  naproxen (NAPROSYN) 500 MG tablet Take 1 tablet (500 mg total) by mouth 2 (two) times daily with a meal. 10/27/16   Larene Pickett, PA-C  nitroGLYCERIN (NITROSTAT) 0.4 MG SL tablet Place 1 tablet (0.4 mg total) under the tongue every 5 (five) minutes as needed for chest pain. 06/05/15   [name], MD  Oxycodone HCl 10 MG TABS Take 1 tablet (10 mg total) by mouth every 6 (six) hours as needed. 12/31/16   [name], MD  sertraline (ZOLOFT) 50 MG tablet Take 50 mg by mouth daily.    Historical Provider, MD  terconazole (TERAZOL 3) 0.8 % vaginal cream Place 1 applicator vaginally at bedtime. 02/08/17   [name], MD  zolpidem (AMBIEN) 10 MG tablet Take 10 mg by mouth at bedtime as needed for sleep.    Historical Provider, MD    Family History Family History  Problem Relation Age of Onset  . Leukemia Mother   . Clotting disorder Father     blood clot  . Hypertension Sister    . Diabetes Sister   . Stroke Sister 74  . Bleeding Disorder Son     ITP "free bleeding disorder"    Social History Social History  Substance Use Topics  . Smoking status: Current Every Day Smoker    Packs/day: 0.50    Years: 0.00    Types: Cigarettes  . Smokeless tobacco: Never Used  . Alcohol use No     Comment: seldom     Allergies   Other; Amoxicillin; Dilaudid [hydromorphone hcl]; and Latex   Review of Systems Review of Systems  All systems reviewed and negative, other than as noted in HPI.  Physical Exam Updated Vital Signs BP (!) 185/114 (BP  Location: Right Arm)   Pulse 75   Temp 98.4 F (36.9 C) (Oral)   Resp 18   Ht 5\' 1"  (1.549 m)   Wt 216 lb (98 kg)   LMP  (LMP Unknown) Comment: verified BEFORE imaging  SpO2 98%   BMI 40.81 kg/m   Physical Exam  Constitutional: She appears well-developed and well-nourished. No distress.  HENT:  Head: Normocephalic and atraumatic.  Eyes: Conjunctivae are normal. Right eye exhibits no discharge. Left eye exhibits no discharge.  Neck: Neck supple.  Cardiovascular: Normal rate, regular rhythm and normal heart sounds.  Exam reveals no gallop and no friction rub.   No murmur heard. Pulmonary/Chest: Effort normal and breath sounds normal. No respiratory distress.  Abdominal: Soft. She exhibits no distension. There is no tenderness.  Musculoskeletal: She exhibits no edema or tenderness.  R buttock tenderness. + straight leg test.NVI.   Neurological: She is alert.  Skin: Skin is warm and dry.  Psychiatric: She has a normal mood and affect. Her behavior is normal. Thought content normal.  Nursing note and vitals reviewed.    ED Treatments / Results  Labs (all labs ordered are listed, but only abnormal results are displayed) Labs Reviewed - No data to display  EKG  EKG Interpretation None       Radiology No results found.  Procedures Procedures (including critical care time)  Medications Ordered in  ED Medications  haloperidol lactate (HALDOL) injection 2 mg (2 mg Intravenous Given 03/03/17 2042)  morphine 4 MG/ML injection 8 mg (8 mg Intravenous Given 03/03/17 2043)  ketorolac (TORADOL) 15 MG/ML injection 15 mg (15 mg Intravenous Given 03/03/17 2042)  dexamethasone (DECADRON) injection 10 mg (10 mg Intravenous Given 03/03/17 2042)     Initial Impression / Assessment and Plan / ED Course  I have reviewed the triage vital signs and the nursing notes.  Pertinent labs & imaging results that were available during my care of the patient were reviewed by me and considered in my medical decision making (see chart for details).   Final Clinical Impressions(s) / ED Diagnoses   Final diagnoses:  Sciatica of right side    New Prescriptions Discharge Medication List as of 03/03/2017  9:04 PM       Virgel Manifold, MD 03/11/17 1056

## 2017-03-30 ENCOUNTER — Encounter: Payer: Self-pay | Admitting: Obstetrics

## 2017-03-30 ENCOUNTER — Ambulatory Visit (INDEPENDENT_AMBULATORY_CARE_PROVIDER_SITE_OTHER): Payer: Medicaid Other | Admitting: Obstetrics

## 2017-03-30 ENCOUNTER — Other Ambulatory Visit (HOSPITAL_COMMUNITY)
Admission: RE | Admit: 2017-03-30 | Discharge: 2017-03-30 | Disposition: A | Discharge status: Home / Self Care | Payer: Medicaid Other | Source: Ambulatory Visit | Attending: Obstetrics | Admitting: Obstetrics

## 2017-03-30 VITALS — BP 170/116 | HR 75 | Ht 61.0 in | Wt 212.0 lb

## 2017-03-30 DIAGNOSIS — N760 Acute vaginitis: Secondary | ICD-10-CM

## 2017-03-30 DIAGNOSIS — B373 Candidiasis of vulva and vagina: Secondary | ICD-10-CM | POA: Diagnosis not present

## 2017-03-30 DIAGNOSIS — B9689 Other specified bacterial agents as the cause of diseases classified elsewhere: Secondary | ICD-10-CM

## 2017-03-30 DIAGNOSIS — N730 Acute parametritis and pelvic cellulitis: Secondary | ICD-10-CM

## 2017-03-30 DIAGNOSIS — R102 Pelvic and perineal pain: Secondary | ICD-10-CM | POA: Insufficient documentation

## 2017-03-30 DIAGNOSIS — N711 Chronic inflammatory disease of uterus: Secondary | ICD-10-CM | POA: Diagnosis not present

## 2017-03-30 DIAGNOSIS — Z9889 Other specified postprocedural states: Secondary | ICD-10-CM

## 2017-03-30 DIAGNOSIS — B3731 Acute candidiasis of vulva and vagina: Secondary | ICD-10-CM

## 2017-03-30 MED ORDER — METRONIDAZOLE 500 MG PO TABS: 500.0000 mg | ORAL_TABLET | Freq: Two times a day (BID) | ORAL | 1 refills | Status: DC

## 2017-03-30 MED ORDER — DOXYCYCLINE HYCLATE 100 MG PO TABS: 100.0000 mg | ORAL_TABLET | Freq: Two times a day (BID) | ORAL | 1 refills | Status: DC

## 2017-03-30 MED ORDER — OXYCODONE HCL 10 MG PO TABS: 10.0000 mg | ORAL_TABLET | ORAL | 0 refills | Status: DC | PRN

## 2017-03-30 MED ORDER — METHOCARBAMOL 500 MG PO TABS: 500.0000 mg | ORAL_TABLET | Freq: Two times a day (BID) | ORAL | 2 refills | Status: DC

## 2017-03-30 MED ORDER — KETOROLAC TROMETHAMINE 60 MG/2ML IM SOLN
60.0000 mg | Freq: Once | INTRAMUSCULAR | Status: AC
  Administered 2017-03-30: 60 mg via INTRAMUSCULAR

## 2017-03-30 MED ORDER — TERCONAZOLE 0.8 % VA CREA: 1.0000 | TOPICAL_CREAM | Freq: Every day | VAGINAL | 2 refills | Status: DC

## 2017-03-30 NOTE — Progress Notes (Signed)
Pt complains of having right side abdominal pain.

## 2017-03-31 ENCOUNTER — Encounter: Payer: Self-pay | Admitting: Obstetrics

## 2017-03-31 LAB — CERVICOVAGINAL ANCILLARY ONLY
Bacterial vaginitis: NEGATIVE
CHLAMYDIA, DNA PROBE: NEGATIVE
Candida vaginitis: NEGATIVE
NEISSERIA GONORRHEA: NEGATIVE
Trichomonas: POSITIVE — AB

## 2017-03-31 NOTE — Progress Notes (Signed)
Patient ID: Tracey Manning, female   DOB: 10-16-1961, 56 y.o.   MRN: [id number]  Chief Complaint  Patient presents with  . Abdominal Pain    HPI Tracey Manning is a 56 y.o. female.  Pelvic pain.  S/P Endometrial Ablation 5 / 2018.  Developed an endometritis post op and was treated with 2 weeks of Doxy / Flagyl and the pain resolved.  She now returns with a recurrence of pelvic pain.  Denies fever/chills or vaginal discharge or bleeding. HPI  Past Medical History:  Diagnosis Date  . Anxiety   . Arthritis   . Asthma   . Chest pain   . Collagen vascular disease ([place])   . Coronary artery disease   . Depression   . Dyslipidemia   . GERD (gastroesophageal reflux disease)    occ  . Hypertension   . Myocardial infarct ([place]) [id number]  . Sciatica   . Sleep apnea    no CPAP ordered but using oxygen at bedtime  . Tobacco abuse     Past Surgical History:  Procedure Laterality Date  . CARDIAC CATHETERIZATION  10/09/2003   [place], [place]) - LAD with 30% prox narrowing, 50% stenosis in mid-portion of PLA; RCA with 40% narrowing proximally ([name], [name])  . CARDIAC CATHETERIZATION  10/10/2007   70% stenosis in first septal perforator branch of LAD, 60-70% narrowing in mid LAD, 20% narrowing in mid AV groove Cfx with 80% diffuse narrowing in small distal marginal, total occlusion of mid RCA with L to R collaterals, 90% stenosis diffusely in prox branch of RCA followed by 70% stenosis in secondary curve & 80% stenosis in small marginal branch ([name])  . CARDIAC CATHETERIZATION  05/28/2008   normal L main, RCA with 100% prox lesion w/distal filling from LAD collaterals, LAD with 20% prox tubular lesion/ 40% mid LAD lesion/previous stent patent ([name])  . CARDIAC CATHETERIZATION  09/15/2009   discrete 100% osital RCA lesion, 50% prox LAD lesion, non-obstructive disease in all coronaries ([name])  . CESAREAN SECTION    . COLONOSCOPY    .  COLONOSCOPY W/ POLYPECTOMY    . CORONARY ANGIOPLASTY WITH STENT PLACEMENT  10/31/2007   PCI of distal Cfx with 2.25x85mm Taxus Adam DES, 60% narrowing of mid LAD ([name])  . CORONARY ANGIOPLASTY WITH STENT PLACEMENT  06/11/2011   PCI of prox-mid LAD with 3x53mm DES Resolute ([name])  . DILATION AND CURETTAGE, DIAGNOSTIC / THERAPEUTIC    . DILITATION & CURRETTAGE/HYSTROSCOPY WITH HYDROTHERMAL ABLATION N/A 04/03/2016   Procedure: DILATATION & CURETTAGE/HYSTEROSCOPY WITH HYDROTHERMAL ABLATION;  Surgeon: [name], MD;  Location: [facility];  Service: Gynecology;  Laterality: N/A;  . NM MYOCAR PERF WALL MOTION  08/2009   persantine myoview - normal perfusion in all regions, perfusion defect in anterior region (breast attenuation), EF 52%, low risk scan    Family History  Problem Relation Age of Onset  . Leukemia Mother   . Clotting disorder Father     blood clot  . Hypertension Sister   . Diabetes Sister   . Stroke Sister 10  . Bleeding Disorder Son     ITP "free bleeding disorder"    Social History Social History  Substance Use Topics  . Smoking status: Current Every Day Smoker    Packs/day: 0.50    Years: 0.00    Types: Cigarettes  . Smokeless tobacco: Never Used  . Alcohol use No  Comment: seldom    Allergies  Allergen Reactions  . Other Anaphylaxis and Hives    Fresh strawberries (throat swelled)  . Amoxicillin Nausea And Vomiting  . Dilaudid [Hydromorphone Hcl] Nausea And Vomiting  . Latex Swelling    No reaction with bandaids    Current Outpatient Prescriptions  Medication Sig Dispense Refill  . albuterol (PROVENTIL HFA;VENTOLIN HFA) 108 (90 BASE) MCG/ACT inhaler Inhale 2 puffs into the lungs every 6 (six) hours as needed for wheezing or shortness of breath.    Marland Kitchen amLODipine (NORVASC) 10 MG tablet Take 10 mg by mouth daily.    Marland Kitchen aspirin EC 325 MG tablet Take 325 mg by mouth daily.    Marland Kitchen atorvastatin (LIPITOR) 40 MG tablet Take 1 tablet (40 mg total)  by mouth daily. 30 tablet 2  . clopidogrel (PLAVIX) 75 MG tablet Take 1 tablet (75 mg total) by mouth daily with breakfast. 30 tablet 2  . erythromycin ophthalmic ointment Place 1 application into the left eye 3 (three) times daily. Place 1/2 inch ribbon of ointment in the affected eye 4 times a day 1 g 1  . fluticasone-salmeterol (ADVAIR HFA) 115-21 MCG/ACT inhaler Inhale 2 puffs into the lungs 2 (two) times daily. 1 Inhaler 12  . lisinopril-hydrochlorothiazide (PRINZIDE,ZESTORETIC) 20-12.5 MG per tablet Take 2 tablets by mouth daily.    . methocarbamol (ROBAXIN) 500 MG tablet Take 1 tablet (500 mg total) by mouth 2 (two) times daily. 30 tablet 2  . sertraline (ZOLOFT) 50 MG tablet Take 50 mg by mouth daily.    Marland Kitchen zolpidem (AMBIEN) 10 MG tablet Take 10 mg by mouth at bedtime as needed for sleep.    Marland Kitchen doxycycline (VIBRA-TABS) 100 MG tablet Take 1 tablet (100 mg total) by mouth 2 (two) times daily. 28 tablet 1  . metroNIDAZOLE (FLAGYL) 500 MG tablet Take 1 tablet (500 mg total) by mouth 2 (two) times daily. 28 tablet 1  . naproxen (NAPROSYN) 500 MG tablet Take 1 tablet (500 mg total) by mouth 2 (two) times daily with a meal. (Patient not taking: Reported on 03/30/2017) 30 tablet 0  . nitroGLYCERIN (NITROSTAT) 0.4 MG SL tablet Place 1 tablet (0.4 mg total) under the tongue every 5 (five) minutes as needed for chest pain. (Patient not taking: Reported on 03/30/2017) 25 tablet 6  . Oxycodone HCl 10 MG TABS Take 1 tablet (10 mg total) by mouth every 4 (four) hours as needed. 30 tablet 0  . terconazole (TERAZOL 3) 0.8 % vaginal cream Place 1 applicator vaginally at bedtime. 20 g 2   No current facility-administered medications for this visit.     Review of Systems Review of Systems Constitutional: negative for fatigue and weight loss Respiratory: negative for cough and wheezing Cardiovascular: negative for chest pain, fatigue and palpitations Gastrointestinal: negative for abdominal pain and change in  bowel habits Genitourinary:positive for pelvic pain Integument/breast: negative for nipple discharge Musculoskeletal:negative for myalgias Neurological: negative for gait problems and tremors Behavioral/Psych: negative for abusive relationship, depression Endocrine: negative for temperature intolerance      Blood pressure (!) 170/116, pulse 75, height 5\' 1"  (1.549 m), weight 212 lb (96.2 kg).  Physical Exam Physical Exam General:   alert  Skin:   no rash or abnormalities  Lungs:   clear to auscultation bilaterally  Heart:   regular rate and rhythm, S1, S2 normal, no murmur, click, rub or gallop  Breasts:   normal without suspicious masses, skin or nipple changes or axillary nodes  Abdomen:  normal findings: no organomegaly, soft, non-tender and no hernia  Pelvis:  External genitalia: normal general appearance Urinary system: urethral meatus normal and bladder without fullness, nontender Vaginal: normal without tenderness, induration or masses Cervix: normal appearance Adnexa: tenderness bilaterally.  No masses. Uterus: anteverted and tender, normal size    50% of 15 min visit spent on counseling and coordination of care.    Data Reviewed Labs Ultrasound  Assessment     Endometritis    Plan    Doxy / Flagyl Rx Oxycodone / Robaxin  Rx F/U in 2 weeks  No orders of the defined types were placed in this encounter.  Meds ordered this encounter  Medications  . DISCONTD: methocarbamol (ROBAXIN) 500 MG tablet    Sig: Take 1 tablet (500 mg total) by mouth 2 (two) times daily.    Dispense:  30 tablet    Refill:  2  . Oxycodone HCl 10 MG TABS    Sig: Take 1 tablet (10 mg total) by mouth every 4 (four) hours as needed.    Dispense:  30 tablet    Refill:  0  . doxycycline (VIBRA-TABS) 100 MG tablet    Sig: Take 1 tablet (100 mg total) by mouth 2 (two) times daily.    Dispense:  28 tablet    Refill:  1  . metroNIDAZOLE (FLAGYL) 500 MG tablet    Sig: Take 1 tablet (500 mg  total) by mouth 2 (two) times daily.    Dispense:  28 tablet    Refill:  1  . terconazole (TERAZOL 3) 0.8 % vaginal cream    Sig: Place 1 applicator vaginally at bedtime.    Dispense:  20 g    Refill:  2  . methocarbamol (ROBAXIN) 500 MG tablet    Sig: Take 1 tablet (500 mg total) by mouth 2 (two) times daily.    Dispense:  30 tablet    Refill:  2  . ketorolac (TORADOL) injection 60 mg

## 2017-04-01 ENCOUNTER — Other Ambulatory Visit: Payer: Self-pay | Admitting: Obstetrics

## 2017-04-01 DIAGNOSIS — A5901 Trichomonal vulvovaginitis: Secondary | ICD-10-CM

## 2017-04-01 MED ORDER — TINIDAZOLE 500 MG PO TABS: 2.0000 g | ORAL_TABLET | Freq: Once | ORAL | 0 refills | Status: AC

## 2017-07-04 ENCOUNTER — Emergency Department (HOSPITAL_COMMUNITY)
Admission: EM | Admit: 2017-07-04 | Discharge: 2017-07-04 | Disposition: A | Discharge status: Home / Self Care | Payer: Medicaid Other | Attending: Emergency Medicine | Admitting: Emergency Medicine

## 2017-07-04 ENCOUNTER — Encounter (HOSPITAL_COMMUNITY): Payer: Self-pay | Admitting: *Deleted

## 2017-07-04 DIAGNOSIS — Z880 Allergy status to penicillin: Secondary | ICD-10-CM | POA: Diagnosis not present

## 2017-07-04 DIAGNOSIS — F17210 Nicotine dependence, cigarettes, uncomplicated: Secondary | ICD-10-CM | POA: Diagnosis not present

## 2017-07-04 DIAGNOSIS — I10 Essential (primary) hypertension: Secondary | ICD-10-CM | POA: Diagnosis not present

## 2017-07-04 DIAGNOSIS — Z7902 Long term (current) use of antithrombotics/antiplatelets: Secondary | ICD-10-CM | POA: Diagnosis not present

## 2017-07-04 DIAGNOSIS — Z79899 Other long term (current) drug therapy: Secondary | ICD-10-CM | POA: Diagnosis not present

## 2017-07-04 DIAGNOSIS — Z7982 Long term (current) use of aspirin: Secondary | ICD-10-CM | POA: Diagnosis not present

## 2017-07-04 DIAGNOSIS — J45909 Unspecified asthma, uncomplicated: Secondary | ICD-10-CM | POA: Diagnosis not present

## 2017-07-04 DIAGNOSIS — Z91040 Latex allergy status: Secondary | ICD-10-CM | POA: Diagnosis not present

## 2017-07-04 DIAGNOSIS — H1031 Unspecified acute conjunctivitis, right eye: Secondary | ICD-10-CM | POA: Diagnosis not present

## 2017-07-04 DIAGNOSIS — M5432 Sciatica, left side: Secondary | ICD-10-CM | POA: Diagnosis not present

## 2017-07-04 DIAGNOSIS — M545 Low back pain: Secondary | ICD-10-CM | POA: Diagnosis present

## 2017-07-04 DIAGNOSIS — I2510 Atherosclerotic heart disease of native coronary artery without angina pectoris: Secondary | ICD-10-CM | POA: Insufficient documentation

## 2017-07-04 DIAGNOSIS — I252 Old myocardial infarction: Secondary | ICD-10-CM | POA: Diagnosis not present

## 2017-07-04 MED ORDER — MORPHINE SULFATE (PF) 4 MG/ML IV SOLN
4.0000 mg | Freq: Once | INTRAVENOUS | Status: AC
  Administered 2017-07-04: 4 mg via INTRAMUSCULAR
  Filled 2017-07-04: qty 1

## 2017-07-04 MED ORDER — KETOROLAC TROMETHAMINE 30 MG/ML IJ SOLN
15.0000 mg | Freq: Once | INTRAMUSCULAR | Status: AC
  Administered 2017-07-04: 15 mg via INTRAMUSCULAR
  Filled 2017-07-04: qty 1

## 2017-07-04 MED ORDER — ERYTHROMYCIN 5 MG/GM OP OINT: 1.0000 "application " | TOPICAL_OINTMENT | Freq: Four times a day (QID) | OPHTHALMIC | 1 refills | Status: DC

## 2017-07-04 MED ORDER — NAPROXEN 250 MG PO TABS: 250.0000 mg | ORAL_TABLET | Freq: Two times a day (BID) | ORAL | 0 refills | Status: DC

## 2017-07-04 NOTE — ED Provider Notes (Signed)
[name] DEPT Provider Note   CSN: [id number] Arrival date & time: 07/04/17  1617  By signing my name below, I, Tracey Manning, attest that this documentation has been prepared under the direction and in the presence of Tracey Izeyah Deike, PA-C. Electronically Signed: Dora Manning, Scribe. 07/04/2017. 7:27 PM.  History   Chief Complaint Chief Complaint  Patient presents with  . Back Pain  . Eye Problem   The history is provided by the patient. No language interpreter was used.    HPI Comments: Tracey Manning is a 56 y.o. female with PMHx including sciatica who presents to the Emergency Department with two separate complaints. She is primarily complaining of an acute exacerbation of her chronic sciatica. Patient reports left-sided lower back pain radiating into the left thigh for about two weeks. The pain is worse with movement and ambulation. No recent falls or trauma to her back. She tried 15mg  morphine two nights ago from her brother without relief. Patient is followed by pain management and receives routine steroid injections, but notes that she cannot secure an appointment anytime soon as she has been having to travel back and forth to [place], [place]. No h/o cancer. Patient denies numbness/tingling, focal weakness, urinary/bowel incontinence, dysuria, hematuria, urinary retention, chest pain, dyspnea, or any other associated symptoms.  She is secondarily complaining of a right eye problem that began this morning. She states that she awoke with right eye redness and itching along with swelling of the upper eyelid. She has had some watery discharge from the eye today. There are no alleviating factors noted. Patient has not recently used any new facial products and she denies any unusual environmental exposures. She wears eye glasses, no contacts. Patient denies eye pain, rhinorrhea, congestion, left eye problems, vision changes, or any other associated symptoms. She is followed by an eye  specialist with whom she intends to follow-up.  Past Medical History:  Diagnosis Date  . Anxiety   . Arthritis   . Asthma   . Chest pain   . Collagen vascular disease ([place])   . Coronary artery disease   . Depression   . Dyslipidemia   . GERD (gastroesophageal reflux disease)    occ  . Hypertension   . Myocardial infarct ([place]) [phone]  . Sciatica   . Sleep apnea    no CPAP ordered but using oxygen at bedtime  . Tobacco abuse     Patient Active Problem List   Diagnosis Date Noted  . Abnormal uterine bleeding (AUB) 04/03/2016  . Chest pain syndrome 11/14/2015  . Dysfunctional uterine bleeding 11/14/2015  . Right sided abdominal pain 11/14/2015  . Right anterior shoulder pain 11/14/2015  . Pelvic pain in female 11/14/2015  . Bacterial vaginosis (recurrent) 11/14/2015  . Chest pain 11/14/2015  . Essential hypertension 04/04/2014  . Hyperlipidemia 04/04/2014  . Coronary artery disease 04/04/2014    Past Surgical History:  Procedure Laterality Date  . CARDIAC CATHETERIZATION  10/09/2003   [name], [place]) - LAD with 30% prox narrowing, 50% stenosis in mid-portion of PLA; RCA with 40% narrowing proximally ([name], [name])  . CARDIAC CATHETERIZATION  10/10/2007   70% stenosis in first septal perforator branch of LAD, 60-70% narrowing in mid LAD, 20% narrowing in mid AV groove Cfx with 80% diffuse narrowing in small distal marginal, total occlusion of mid RCA with L to R collaterals, 90% stenosis diffusely in prox branch of RCA followed by 70% stenosis in secondary curve & 80% stenosis in small marginal  branch ([name])  . CARDIAC CATHETERIZATION  05/28/2008   normal L main, RCA with 100% prox lesion w/distal filling from LAD collaterals, LAD with 20% prox tubular lesion/ 40% mid LAD lesion/previous stent patent ([name])  . CARDIAC CATHETERIZATION  09/15/2009   discrete 100% osital RCA lesion, 50% prox LAD lesion, non-obstructive disease in all  coronaries ([name])  . CESAREAN SECTION    . COLONOSCOPY    . COLONOSCOPY W/ POLYPECTOMY    . CORONARY ANGIOPLASTY WITH STENT PLACEMENT  10/31/2007   PCI of distal Cfx with 2.25x52mm Taxus Adam DES, 60% narrowing of mid LAD ([name])  . CORONARY ANGIOPLASTY WITH STENT PLACEMENT  06/11/2011   PCI of prox-mid LAD with 3x36mm DES Resolute ([name])  . DILATION AND CURETTAGE, DIAGNOSTIC / THERAPEUTIC    . DILITATION & CURRETTAGE/HYSTROSCOPY WITH HYDROTHERMAL ABLATION N/A 04/03/2016   Procedure: DILATATION & CURETTAGE/HYSTEROSCOPY WITH HYDROTHERMAL ABLATION;  Surgeon: [name], MD;  Location: [facility];  Service: Gynecology;  Laterality: N/A;  . NM MYOCAR PERF WALL MOTION  08/2009   persantine myoview - normal perfusion in all regions, perfusion defect in anterior region (breast attenuation), EF 52%, low risk scan    OB History    No data available       Home Medications    Prior to Admission medications   Medication Sig Start Date End Date Taking? Authorizing Provider  albuterol (PROVENTIL HFA;VENTOLIN HFA) 108 (90 BASE) MCG/ACT inhaler Inhale 2 puffs into the lungs every 6 (six) hours as needed for wheezing or shortness of breath.    [provider]  amLODipine (NORVASC) 10 MG tablet Take 10 mg by mouth daily.    [provider]  aspirin EC 325 MG tablet Take 325 mg by mouth daily.    [provider]  atorvastatin (LIPITOR) 40 MG tablet Take 1 tablet (40 mg total) by mouth daily. 01/04/14   [name], MD  clopidogrel (PLAVIX) 75 MG tablet Take 1 tablet (75 mg total) by mouth daily with breakfast. 01/04/14   [name], MD  doxycycline (VIBRA-TABS) 100 MG tablet Take 1 tablet (100 mg total) by mouth 2 (two) times daily. 03/30/17   [name], MD  erythromycin ophthalmic ointment Place 1 application into both eyes every 6 (six) hours. Place 1/2 inch ribbon of ointment in the affected eye 4 times a day 07/04/17   [name],  PA-C  fluticasone-salmeterol (ADVAIR HFA) [phone] MCG/ACT inhaler Inhale 2 puffs into the lungs 2 (two) times daily. 01/04/14   [name], MD  lisinopril-hydrochlorothiazide (PRINZIDE,ZESTORETIC) 20-12.5 MG per tablet Take 2 tablets by mouth daily.    [provider]  methocarbamol (ROBAXIN) 500 MG tablet Take 1 tablet (500 mg total) by mouth 2 (two) times daily. 03/30/17   [name], MD  metroNIDAZOLE (FLAGYL) 500 MG tablet Take 1 tablet (500 mg total) by mouth 2 (two) times daily. 03/30/17   [name], MD  naproxen (NAPROSYN) 250 MG tablet Take 1 tablet (250 mg total) by mouth 2 (two) times daily with a meal. 07/04/17   [name], PA-C  nitroGLYCERIN (NITROSTAT) 0.4 MG SL tablet Place 1 tablet (0.4 mg total) under the tongue every 5 (five) minutes as needed for chest pain. Patient not taking: Reported on 03/30/2017 06/05/15   [name], MD  Oxycodone HCl 10 MG TABS Take 1 tablet (10 mg total) by mouth every 4 (four) hours as  needed. 03/30/17   [name], MD  sertraline (ZOLOFT) 50 MG tablet Take 50 mg by mouth daily.    [provider]  terconazole (TERAZOL 3) 0.8 % vaginal cream Place 1 applicator vaginally at bedtime. 03/30/17   [name], MD  zolpidem (AMBIEN) 10 MG tablet Take 10 mg by mouth at bedtime as needed for sleep.    [provider]    Family History Family History  Problem Relation Age of Onset  . Leukemia Mother   . Clotting disorder Father        blood clot  . Hypertension Sister   . Diabetes Sister   . Stroke Sister 45  . Bleeding Disorder Son        ITP "free bleeding disorder"    Social History Social History  Substance Use Topics  . Smoking status: Current Every Day Smoker    Packs/day: 0.50    Years: 0.00    Types: Cigarettes  . Smokeless tobacco: Never Used  . Alcohol use No     Comment: seldom     Allergies   Other; Amoxicillin; Dilaudid [hydromorphone hcl]; and Latex   Review of  Systems Review of Systems  Constitutional: Negative for chills and fever.  HENT: Negative for congestion, rhinorrhea and sore throat.   Eyes: Positive for discharge (R), redness (R) and itching (R). Negative for photophobia, pain and visual disturbance.  Respiratory: Negative for cough, shortness of breath and wheezing.   Cardiovascular: Negative for chest pain and palpitations.  Gastrointestinal: Negative for abdominal pain, diarrhea, nausea and vomiting.       Negative for bowel incontinence.  Genitourinary: Negative for difficulty urinating, dysuria, hematuria and urgency.       Negative for urinary incontinence.  Musculoskeletal: Positive for back pain and myalgias. Negative for neck pain.  Skin: Negative for rash.  Allergic/Immunologic: Negative for immunocompromised state.  Neurological: Negative for weakness, numbness and headaches.   Physical Exam Updated Vital Signs BP (!) 183/109 (BP Location: Right Arm)   Pulse 77   Temp 98.1 F (36.7 C) (Oral)   Resp 18   Ht 5\' 1"  (1.549 m)   Wt 96.3 kg (212 lb 4.8 oz)   LMP  (LMP Unknown) Comment: verified BEFORE imaging  SpO2 98%   BMI 40.11 kg/m   Physical Exam  Constitutional: She appears well-developed and well-nourished. No distress.   Nontoxic appearing. Obese female.  HENT:  Head: Normocephalic and atraumatic.  Mouth/Throat: Oropharynx is clear and moist.  Eyes: Pupils are equal, round, and reactive to light. EOM are normal. Right eye exhibits chemosis. Right eye exhibits no discharge. Left eye exhibits no discharge. Right conjunctiva is injected.  Right eye conjunctival injection with slight ecchymosis. She has some slight surrounding paranoid edema without erythema. EOMs are intact without pain or discomfort. Vision is grossly intact. No purulent discharge.  Neck: Neck supple.  Cardiovascular: Normal rate, regular rhythm, normal heart sounds and intact distal pulses.   Pulses:      Dorsalis pedis pulses are 2+ on the  right side, and 2+ on the left side.       Posterior tibial pulses are 2+ on the right side, and 2+ on the left side.  Bilateral dorsalis pedis and posterior tibialis pulses are intact.  Pulmonary/Chest: Effort normal and breath sounds normal. No respiratory distress.  Abdominal: Soft. There is no tenderness.  Musculoskeletal: Normal range of motion. She exhibits tenderness. She exhibits no edema or deformity.  No midline neck  or back tenderness. Tenderness over the left low back musculature. No back erythema, deformity or ecchymosis. Good strength to her bilateral lower extremities.  Neurological: She is alert. She displays normal reflexes. No sensory deficit. Coordination normal.  Bilateral patellar DTRs are intact. Normal gait.  Skin: Skin is warm and dry. Capillary refill takes less than 2 seconds. No rash noted. She is not diaphoretic. No erythema. No pallor.  Psychiatric: She has a normal mood and affect. Her behavior is normal.  Nursing note and vitals reviewed.  ED Treatments / Results  Labs (all labs ordered are listed, but only abnormal results are displayed) Labs Reviewed - No data to display  EKG  EKG Interpretation None       Radiology No results found.  Procedures Procedures (including critical care time)  DIAGNOSTIC STUDIES: Oxygen Saturation is 98% on RA, normal by my interpretation.    COORDINATION OF CARE: 7:23 PM Discussed treatment plan with pt at bedside and pt agreed to plan.  Medications Ordered in ED Medications  ketorolac (TORADOL) 30 MG/ML injection 15 mg (not administered)  morphine 4 MG/ML injection 4 mg (not administered)     Initial Impression / Assessment and Plan / ED Course  I have reviewed the triage vital signs and the nursing notes.  Pertinent labs & imaging results that were available during my care of the patient were reviewed by me and considered in my medical decision making (see chart for details).    This s a 56 y.o. female  with PMHx including sciatica who presents to the Emergency Department with two separate complaints. She is primarily complaining of an acute exacerbation of her chronic sciatica. Patient reports left-sided lower back pain radiating into the left thigh for about two weeks. The pain is worse with movement and ambulation. No recent falls or trauma to her back.  No h/o cancer. Patient denies numbness/tingling, focal weakness, urinary/bowel incontinence, dysuria, hematuria, urinary retention, chest pain, dyspnea, or any other associated symptoms.  She is secondarily complaining of a right eye problem that began this morning. She states that she awoke with right eye redness and itching along with swelling of the upper eyelid. She has had some watery discharge from the eye today. There are no alleviating factors noted. Patient has not recently used any new facial products and she denies any unusual environmental exposures. She wears eye glasses, no contacts. Patient denies eye pain, rhinorrhea, congestion, left eye problems, vision changes.  Patient with back pain and sciatica.  No neurological deficits and normal neuro exam.  Patient can walk but states is painful.  No loss of bowel or bladder control.  No concern for cauda equina.  No fever, night sweats, weight loss, h/o cancer, IVDU.  Tracey provide patient with IM morphine and Toradol here in the emergency room and discharged with a short course of naproxen. She is taking this previously. She needs follow-up with her PCP and pain management. Eye exam reveals conjunctivitis. Likely viral. She has conjunctival injection and slight ecchymosis. She has slight periorbital edema without erythema. No evidence of periorbital cellulitis. EOMs are intact without pain. We'll discharge with erythromycin. Return precautions discussed. I advised the patient to follow-up with their primary care provider this week. I advised the patient to return to the emergency department with  new or worsening symptoms or new concerns. The patient verbalized understanding and agreement with plan.      Final Clinical Impressions(s) / ED Diagnoses   Final diagnoses:  Sciatica of left  side  Acute conjunctivitis of right eye, unspecified acute conjunctivitis type    New Prescriptions New Prescriptions   ERYTHROMYCIN OPHTHALMIC OINTMENT    Place 1 application into both eyes every 6 (six) hours. Place 1/2 inch ribbon of ointment in the affected eye 4 times a day   NAPROXEN (NAPROSYN) 250 MG TABLET    Take 1 tablet (250 mg total) by mouth 2 (two) times daily with a meal.   I personally performed the services described in this documentation, which was scribed in my presence. The recorded information has been reviewed and is accurate.      [name], PA-C 07/04/17 1940    Isla Pence, MD 07/04/17 2023

## 2017-07-04 NOTE — ED Triage Notes (Signed)
The pt is c/o back pain  She has had her last shot of pain may 22  She has sciatica and she goes to pain management clinic   She has been out of town

## 2017-07-07 ENCOUNTER — Encounter: Payer: Self-pay | Admitting: Obstetrics

## 2017-07-07 ENCOUNTER — Other Ambulatory Visit (HOSPITAL_COMMUNITY)
Admission: RE | Admit: 2017-07-07 | Discharge: 2017-07-07 | Disposition: A | Discharge status: Home / Self Care | Payer: Medicaid Other | Source: Ambulatory Visit | Attending: Obstetrics | Admitting: Obstetrics

## 2017-07-07 ENCOUNTER — Ambulatory Visit (INDEPENDENT_AMBULATORY_CARE_PROVIDER_SITE_OTHER): Payer: Medicaid Other | Admitting: Obstetrics

## 2017-07-07 VITALS — BP 149/94 | HR 62 | Wt 213.4 lb

## 2017-07-07 DIAGNOSIS — Z202 Contact with and (suspected) exposure to infections with a predominantly sexual mode of transmission: Secondary | ICD-10-CM

## 2017-07-07 DIAGNOSIS — R102 Pelvic and perineal pain: Secondary | ICD-10-CM

## 2017-07-07 LAB — POCT URINALYSIS DIPSTICK
BILIRUBIN UA: NEGATIVE
Blood, UA: NEGATIVE
GLUCOSE UA: NEGATIVE
LEUKOCYTES UA: NEGATIVE
NITRITE UA: NEGATIVE
PH UA: 5 (ref 5.0–8.0)
Protein, UA: NEGATIVE
Spec Grav, UA: 1.015 (ref 1.010–1.025)
Urobilinogen, UA: 0.2 E.U./dL

## 2017-07-07 MED ORDER — OXYCODONE HCL 10 MG PO TABS: 10.0000 mg | ORAL_TABLET | Freq: Four times a day (QID) | ORAL | 0 refills | Status: DC | PRN

## 2017-07-07 NOTE — Progress Notes (Signed)
Patient is in the office for follow up TOC.

## 2017-07-08 ENCOUNTER — Other Ambulatory Visit: Payer: Self-pay | Admitting: Obstetrics

## 2017-07-08 ENCOUNTER — Encounter: Payer: Self-pay | Admitting: Obstetrics

## 2017-07-08 DIAGNOSIS — R102 Pelvic and perineal pain: Secondary | ICD-10-CM

## 2017-07-08 DIAGNOSIS — N730 Acute parametritis and pelvic cellulitis: Secondary | ICD-10-CM

## 2017-07-08 DIAGNOSIS — B3731 Acute candidiasis of vulva and vagina: Secondary | ICD-10-CM

## 2017-07-08 DIAGNOSIS — B373 Candidiasis of vulva and vagina: Secondary | ICD-10-CM

## 2017-07-08 LAB — CERVICOVAGINAL ANCILLARY ONLY
BACTERIAL VAGINITIS: POSITIVE — AB
CHLAMYDIA, DNA PROBE: NEGATIVE
Candida vaginitis: POSITIVE — AB
Neisseria Gonorrhea: NEGATIVE
Trichomonas: NEGATIVE

## 2017-07-08 MED ORDER — DOXYCYCLINE HYCLATE 100 MG PO TABS: 100.0000 mg | ORAL_TABLET | Freq: Two times a day (BID) | ORAL | 1 refills | Status: DC

## 2017-07-08 MED ORDER — TERCONAZOLE 0.8 % VA CREA: 1.0000 | TOPICAL_CREAM | Freq: Every day | VAGINAL | 2 refills | Status: DC

## 2017-07-08 MED ORDER — METRONIDAZOLE 500 MG PO TABS: 500.0000 mg | ORAL_TABLET | Freq: Two times a day (BID) | ORAL | 1 refills | Status: DC

## 2017-07-08 NOTE — Progress Notes (Signed)
Patient ID: Tracey Manning, female   DOB: May 15, 1961, 56 y.o.   MRN: [id number]  Chief Complaint  Patient presents with  . Follow up    HPI Tracey Manning is a 56 y.o. female.  History of Trichomonas vaginitis, treated.  Presents for TOC cultures.  Continues to have pelvic pain. HPI  Past Medical History:  Diagnosis Date  . Anxiety   . Arthritis   . Asthma   . Chest pain   . Collagen vascular disease ([place])   . Coronary artery disease   . Depression   . Dyslipidemia   . GERD (gastroesophageal reflux disease)    occ  . Hypertension   . Myocardial infarct ([place]) [phone]  . Sciatica   . Sleep apnea    no CPAP ordered but using oxygen at bedtime  . Tobacco abuse     Past Surgical History:  Procedure Laterality Date  . CARDIAC CATHETERIZATION  10/09/2003   [facility] [place], [place]) - LAD with 30% prox narrowing, 50% stenosis in mid-portion of PLA; RCA with 40% narrowing proximally ([name], [name])  . CARDIAC CATHETERIZATION  10/10/2007   70% stenosis in first septal perforator branch of LAD, 60-70% narrowing in mid LAD, 20% narrowing in mid AV groove Cfx with 80% diffuse narrowing in small distal marginal, total occlusion of mid RCA with L to R collaterals, 90% stenosis diffusely in prox branch of RCA followed by 70% stenosis in secondary curve & 80% stenosis in small marginal branch ([name])  . CARDIAC CATHETERIZATION  05/28/2008   normal L main, RCA with 100% prox lesion w/distal filling from LAD collaterals, LAD with 20% prox tubular lesion/ 40% mid LAD lesion/previous stent patent ([name])  . CARDIAC CATHETERIZATION  09/15/2009   discrete 100% osital RCA lesion, 50% prox LAD lesion, non-obstructive disease in all coronaries ([name])  . CESAREAN SECTION    . COLONOSCOPY    . COLONOSCOPY W/ POLYPECTOMY    . CORONARY ANGIOPLASTY WITH STENT PLACEMENT  10/31/2007   PCI of distal Cfx with 2.25x3mm Taxus Adam DES, 60% narrowing of mid LAD ([name])  . CORONARY ANGIOPLASTY WITH STENT PLACEMENT  06/11/2011   PCI of prox-mid LAD with 3x71mm DES Resolute ([name])  . DILATION AND CURETTAGE, DIAGNOSTIC / THERAPEUTIC    . DILITATION & CURRETTAGE/HYSTROSCOPY WITH HYDROTHERMAL ABLATION N/A 04/03/2016   Procedure: DILATATION & CURETTAGE/HYSTEROSCOPY WITH HYDROTHERMAL ABLATION;  Surgeon: [name], MD;  Location: [facility];  Service: Gynecology;  Laterality: N/A;  . NM MYOCAR PERF WALL MOTION  08/2009   persantine myoview - normal perfusion in all regions, perfusion defect in anterior region (breast attenuation), EF 52%, low risk scan    Family History  Problem Relation Age of Onset  . Leukemia Mother   . Clotting disorder Father        blood clot  . Hypertension Sister   . Diabetes Sister   . Stroke Sister 73  . Bleeding Disorder Son        ITP "free bleeding disorder"    Social History Social History  Substance Use Topics  . Smoking status: Current Every Day Smoker    Packs/day: 0.50    Years: 0.00    Types: Cigarettes  . Smokeless tobacco: Never Used  . Alcohol use No     Comment: seldom    Allergies  Allergen Reactions  . Other Anaphylaxis and Hives    Fresh strawberries (throat swelled)  .  Amoxicillin Nausea And Vomiting  . Dilaudid [Hydromorphone Hcl] Nausea And Vomiting  . Latex Swelling    No reaction with bandaids    Current Outpatient Prescriptions  Medication Sig Dispense Refill  . albuterol (PROVENTIL HFA;VENTOLIN HFA) 108 (90 BASE) MCG/ACT inhaler Inhale 2 puffs into the lungs every 6 (six) hours as needed for wheezing or shortness of breath.    Marland Kitchen amLODipine (NORVASC) 10 MG tablet Take 10 mg by mouth daily.    Marland Kitchen aspirin EC 325 MG tablet Take 325 mg by mouth daily.    Marland Kitchen atorvastatin (LIPITOR) 40 MG tablet Take 1 tablet (40 mg total) by mouth daily. 30 tablet 2  . clopidogrel (PLAVIX) 75 MG tablet Take 1 tablet (75 mg total) by mouth daily with breakfast. 30 tablet 2  . doxycycline (VIBRA-TABS)  100 MG tablet Take 1 tablet (100 mg total) by mouth 2 (two) times daily. 28 tablet 1  . erythromycin ophthalmic ointment Place 1 application into both eyes every 6 (six) hours. Place 1/2 inch ribbon of ointment in the affected eye 4 times a day 1 g 1  . fluticasone-salmeterol (ADVAIR HFA) 115-21 MCG/ACT inhaler Inhale 2 puffs into the lungs 2 (two) times daily. 1 Inhaler 12  . lisinopril-hydrochlorothiazide (PRINZIDE,ZESTORETIC) 20-12.5 MG per tablet Take 2 tablets by mouth daily.    . methocarbamol (ROBAXIN) 500 MG tablet Take 1 tablet (500 mg total) by mouth 2 (two) times daily. 30 tablet 2  . naproxen (NAPROSYN) 250 MG tablet Take 1 tablet (250 mg total) by mouth 2 (two) times daily with a meal. 20 tablet 0  . nitroGLYCERIN (NITROSTAT) 0.4 MG SL tablet Place 1 tablet (0.4 mg total) under the tongue every 5 (five) minutes as needed for chest pain. 25 tablet 6  . Oxycodone HCl 10 MG TABS Take 1 tablet (10 mg total) by mouth every 6 (six) hours as needed. 30 tablet 0  . sertraline (ZOLOFT) 50 MG tablet Take 50 mg by mouth daily.    Marland Kitchen terconazole (TERAZOL 3) 0.8 % vaginal cream Place 1 applicator vaginally at bedtime. 20 g 2  . zolpidem (AMBIEN) 10 MG tablet Take 10 mg by mouth at bedtime as needed for sleep.    . metroNIDAZOLE (FLAGYL) 500 MG tablet Take 1 tablet (500 mg total) by mouth 2 (two) times daily. (Patient not taking: Reported on 07/07/2017) 28 tablet 1   No current facility-administered medications for this visit.     Review of Systems Review of Systems Constitutional: negative for fatigue and weight loss Respiratory: negative for cough and wheezing Cardiovascular: negative for chest pain, fatigue and palpitations Gastrointestinal: negative for abdominal pain and change in bowel habits Genitourinary:positive for pelvic pain Integument/breast: negative for nipple discharge Musculoskeletal:negative for myalgias Neurological: negative for gait problems and tremors Behavioral/Psych:  negative for abusive relationship, depression Endocrine: negative for temperature intolerance      Blood pressure (!) 149/94, pulse 62, weight 213 lb 6.4 oz (96.8 kg).  Physical Exam Physical Exam           General:  Alert and no distress Abdomen:  normal findings: no organomegaly, soft, non-tender and no hernia  Pelvis:  External genitalia: normal general appearance Urinary system: urethral meatus normal and bladder without fullness, nontender Vaginal: normal without tenderness, induration or masses Cervix: normal appearance Adnexa: normal bimanual exam Uterus: anteverted and non-tender, normal size    50% of 15 min visit spent on counseling and coordination of care.    Data Reviewed Labs  Assessment  1. Exposure to trichomonas Rx: - Cervicovaginal ancillary only - Oxycodone HCl 10 MG TABS; Take 1 tablet (10 mg total) by mouth every 6 (six) hours as needed.  Dispense: 30 tablet; Refill: 0  2. Pelvic pain Rx: - POCT Urinalysis Dipstick -Oxycodone Rx    Plan    Follow up in 3 months  Orders Placed This Encounter  Procedures  . POCT Urinalysis Dipstick   Meds ordered this encounter  Medications  . Oxycodone HCl 10 MG TABS    Sig: Take 1 tablet (10 mg total) by mouth every 6 (six) hours as needed.    Dispense:  30 tablet    Refill:  0

## 2017-07-09 ENCOUNTER — Other Ambulatory Visit: Payer: Self-pay | Admitting: Obstetrics

## 2017-09-30 ENCOUNTER — Ambulatory Visit: Payer: Self-pay | Admitting: Obstetrics

## 2017-10-06 ENCOUNTER — Emergency Department (HOSPITAL_COMMUNITY)
Admission: EM | Admit: 2017-10-06 | Discharge: 2017-10-06 | Disposition: A | Discharge status: Home / Self Care | Payer: Medicaid Other | Attending: Emergency Medicine | Admitting: Emergency Medicine

## 2017-10-06 ENCOUNTER — Emergency Department (HOSPITAL_COMMUNITY): Payer: Medicaid Other

## 2017-10-06 ENCOUNTER — Other Ambulatory Visit: Payer: Self-pay

## 2017-10-06 ENCOUNTER — Encounter (HOSPITAL_COMMUNITY): Payer: Self-pay | Admitting: Emergency Medicine

## 2017-10-06 DIAGNOSIS — R6884 Jaw pain: Secondary | ICD-10-CM | POA: Insufficient documentation

## 2017-10-06 DIAGNOSIS — Z5321 Procedure and treatment not carried out due to patient leaving prior to being seen by health care provider: Secondary | ICD-10-CM | POA: Insufficient documentation

## 2017-10-06 DIAGNOSIS — R110 Nausea: Secondary | ICD-10-CM | POA: Insufficient documentation

## 2017-10-06 DIAGNOSIS — I252 Old myocardial infarction: Secondary | ICD-10-CM | POA: Insufficient documentation

## 2017-10-06 DIAGNOSIS — R079 Chest pain, unspecified: Secondary | ICD-10-CM | POA: Insufficient documentation

## 2017-10-06 DIAGNOSIS — M79601 Pain in right arm: Secondary | ICD-10-CM | POA: Insufficient documentation

## 2017-10-06 LAB — CBC
HCT: 42.1 % (ref 36.0–46.0)
HEMOGLOBIN: 14.3 g/dL (ref 12.0–15.0)
MCH: 28.8 pg (ref 26.0–34.0)
MCHC: 34 g/dL (ref 30.0–36.0)
MCV: 84.7 fL (ref 78.0–100.0)
Platelets: 220 10*3/uL (ref 150–400)
RBC: 4.97 MIL/uL (ref 3.87–5.11)
RDW: 15.4 % (ref 11.5–15.5)
WBC: 6.7 10*3/uL (ref 4.0–10.5)

## 2017-10-06 LAB — BASIC METABOLIC PANEL
ANION GAP: 9 (ref 5–15)
BUN: 14 mg/dL (ref 6–20)
CHLORIDE: 104 mmol/L (ref 101–111)
CO2: 24 mmol/L (ref 22–32)
Calcium: 8.9 mg/dL (ref 8.9–10.3)
Creatinine, Ser: 0.92 mg/dL (ref 0.44–1.00)
GFR calc non Af Amer: >60 mL/min (ref >60)
Glucose, Bld: 112 mg/dL — ABNORMAL HIGH (ref 65–99)
Potassium: 3.4 mmol/L — ABNORMAL LOW (ref 3.5–5.1)
Sodium: 137 mmol/L (ref 135–145)

## 2017-10-06 LAB — I-STAT TROPONIN, ED: TROPONIN I, POC: 0.01 ng/mL (ref 0.00–0.08)

## 2017-10-06 NOTE — ED Triage Notes (Signed)
Pt states she has been having right sided chest pain  Since yesterday that extends into right jaw and right arm. Pt has had some nausea. Pt has hx of MI.

## 2017-10-25 ENCOUNTER — Other Ambulatory Visit (HOSPITAL_COMMUNITY)
Admission: RE | Admit: 2017-10-25 | Discharge: 2017-10-25 | Disposition: A | Discharge status: Home / Self Care | Payer: Medicaid Other | Source: Ambulatory Visit | Attending: Obstetrics | Admitting: Obstetrics

## 2017-10-25 ENCOUNTER — Ambulatory Visit: Payer: Medicaid Other | Admitting: Obstetrics

## 2017-10-25 ENCOUNTER — Other Ambulatory Visit: Payer: Self-pay

## 2017-10-25 ENCOUNTER — Encounter: Payer: Self-pay | Admitting: Obstetrics

## 2017-10-25 VITALS — BP 173/102 | HR 68 | Ht 60.0 in | Wt 212.0 lb

## 2017-10-25 DIAGNOSIS — R102 Pelvic and perineal pain: Secondary | ICD-10-CM

## 2017-10-25 DIAGNOSIS — Z0000 Encounter for general adult medical examination without abnormal findings: Secondary | ICD-10-CM

## 2017-10-25 DIAGNOSIS — G8929 Other chronic pain: Secondary | ICD-10-CM

## 2017-10-25 DIAGNOSIS — Z01419 Encounter for gynecological examination (general) (routine) without abnormal findings: Secondary | ICD-10-CM

## 2017-10-25 DIAGNOSIS — Z202 Contact with and (suspected) exposure to infections with a predominantly sexual mode of transmission: Secondary | ICD-10-CM

## 2017-10-25 DIAGNOSIS — N898 Other specified noninflammatory disorders of vagina: Secondary | ICD-10-CM

## 2017-10-25 MED ORDER — OXYCODONE HCL 10 MG PO TABS: 10.0000 mg | ORAL_TABLET | Freq: Four times a day (QID) | ORAL | 0 refills | Status: DC | PRN

## 2017-10-25 NOTE — Progress Notes (Signed)
Complains of pain in lower abdomen 8/10 x 2 years Bp is elevated, she took her Rx this AM.

## 2017-10-26 ENCOUNTER — Encounter: Payer: Self-pay | Admitting: Obstetrics

## 2017-10-26 LAB — CERVICOVAGINAL ANCILLARY ONLY
Bacterial vaginitis: NEGATIVE
CANDIDA VAGINITIS: NEGATIVE
Chlamydia: NEGATIVE
Neisseria Gonorrhea: NEGATIVE
TRICH (WINDOWPATH): NEGATIVE

## 2017-10-26 LAB — CYTOLOGY - PAP
DIAGNOSIS: NEGATIVE
HPV: NOT DETECTED

## 2017-10-26 NOTE — Progress Notes (Addendum)
Subjective:        Tracey Manning is a 56 y.o. female here for a routine exam.  Current complaints: Chronic pelvic pain.    Personal health questionnaire:  Is patient Tracey Manning, have a family history of breast and/or ovarian cancer: no Is there a family history of uterine cancer diagnosed at age < 20, gastrointestinal cancer, urinary tract cancer, family member who is a Field seismologist syndrome-associated carrier: no Is the patient overweight and hypertensive, family history of diabetes, personal history of gestational diabetes, preeclampsia or PCOS: no Is patient over 2, have PCOS,  family history of premature CHD under age 24, diabetes, smoke, have hypertension or peripheral artery disease:  no At any time, has a partner hit, kicked or otherwise hurt or frightened you?: no Over the past 2 weeks, have you felt down, depressed or hopeless?: no Over the past 2 weeks, have you felt little interest or pleasure in doing things?:no   Gynecologic History No LMP recorded (lmp unknown). Patient is postmenopausal. Contraception: post menopausal status Last Pap: 2014. Results were: normal Last mammogram: 2016. Results were: normal  Obstetric History OB History  No data available    Past Medical History:  Diagnosis Date  . Anxiety   . Arthritis   . Asthma   . Chest pain   . Collagen vascular disease ([place])   . Coronary artery disease   . Depression   . Dyslipidemia   . GERD (gastroesophageal reflux disease)    occ  . Hypertension   . Myocardial infarct ([place]) 8413,2440  . Sciatica   . Sleep apnea    no CPAP ordered but using oxygen at bedtime  . Tobacco abuse     Past Surgical History:  Procedure Laterality Date  . CARDIAC CATHETERIZATION  10/09/2003   [place], [place]) - LAD with 30% prox narrowing, 50% stenosis in mid-portion of PLA; RCA with 40% narrowing proximally ([name], [name])  . CARDIAC CATHETERIZATION  10/10/2007   70% stenosis in first  septal perforator branch of LAD, 60-70% narrowing in mid LAD, 20% narrowing in mid AV groove Cfx with 80% diffuse narrowing in small distal marginal, total occlusion of mid RCA with L to R collaterals, 90% stenosis diffusely in prox branch of RCA followed by 70% stenosis in secondary curve & 80% stenosis in small marginal branch ([name])  . CARDIAC CATHETERIZATION  05/28/2008   normal L main, RCA with 100% prox lesion w/distal filling from LAD collaterals, LAD with 20% prox tubular lesion/ 40% mid LAD lesion/previous stent patent ([name])  . CARDIAC CATHETERIZATION  09/15/2009   discrete 100% osital RCA lesion, 50% prox LAD lesion, non-obstructive disease in all coronaries ([name])  . CESAREAN SECTION    . COLONOSCOPY    . COLONOSCOPY W/ POLYPECTOMY    . CORONARY ANGIOPLASTY WITH STENT PLACEMENT  10/31/2007   PCI of distal Cfx with 2.25x20mm Taxus Adam DES, 60% narrowing of mid LAD ([name])  . CORONARY ANGIOPLASTY WITH STENT PLACEMENT  06/11/2011   PCI of prox-mid LAD with 3x27mm DES Resolute ([name])  . DILATION AND CURETTAGE, DIAGNOSTIC / THERAPEUTIC    . DILITATION & CURRETTAGE/HYSTROSCOPY WITH HYDROTHERMAL ABLATION N/A 04/03/2016   Procedure: DILATATION & CURETTAGE/HYSTEROSCOPY WITH HYDROTHERMAL ABLATION;  Surgeon: [name], MD;  Location: [facility];  Service: Gynecology;  Laterality: N/A;  . NM MYOCAR PERF WALL MOTION  08/2009   persantine myoview - normal perfusion in all regions,  perfusion defect in anterior region (breast attenuation), EF 52%, low risk scan     Current Outpatient Medications:  .  albuterol (PROVENTIL HFA;VENTOLIN HFA) 108 (90 BASE) MCG/ACT inhaler, Inhale 2 puffs into the lungs every 6 (six) hours as needed for wheezing or shortness of breath., Disp: , Rfl:  .  amLODipine (NORVASC) 10 MG tablet, Take 10 mg by mouth daily., Disp: , Rfl:  .  aspirin EC 325 MG tablet, Take 325 mg by mouth daily., Disp: , Rfl:  .  atorvastatin (LIPITOR) 40  MG tablet, Take 1 tablet (40 mg total) by mouth daily., Disp: 30 tablet, Rfl: 2 .  clopidogrel (PLAVIX) 75 MG tablet, Take 1 tablet (75 mg total) by mouth daily with breakfast., Disp: 30 tablet, Rfl: 2 .  erythromycin ophthalmic ointment, Place 1 application into both eyes every 6 (six) hours. Place 1/2 inch ribbon of ointment in the affected eye 4 times a day, Disp: 1 g, Rfl: 1 .  fluticasone-salmeterol (ADVAIR HFA) 115-21 MCG/ACT inhaler, Inhale 2 puffs into the lungs 2 (two) times daily., Disp: 1 Inhaler, Rfl: 12 .  lisinopril-hydrochlorothiazide (PRINZIDE,ZESTORETIC) 20-12.5 MG per tablet, Take 2 tablets by mouth daily., Disp: , Rfl:  .  methocarbamol (ROBAXIN) 500 MG tablet, Take 1 tablet (500 mg total) by mouth 2 (two) times daily., Disp: 30 tablet, Rfl: 2 .  naproxen (NAPROSYN) 250 MG tablet, Take 1 tablet (250 mg total) by mouth 2 (two) times daily with a meal., Disp: 20 tablet, Rfl: 0 .  nitroGLYCERIN (NITROSTAT) 0.4 MG SL tablet, Place 1 tablet (0.4 mg total) under the tongue every 5 (five) minutes as needed for chest pain., Disp: 25 tablet, Rfl: 6 .  sertraline (ZOLOFT) 50 MG tablet, Take 50 mg by mouth daily., Disp: , Rfl:  .  zolpidem (AMBIEN) 10 MG tablet, Take 10 mg by mouth at bedtime as needed for sleep., Disp: , Rfl:  .  doxycycline (VIBRA-TABS) 100 MG tablet, Take 1 tablet (100 mg total) by mouth 2 (two) times daily. (Patient not taking: Reported on 10/25/2017), Disp: 28 tablet, Rfl: 1 .  metroNIDAZOLE (FLAGYL) 500 MG tablet, Take 1 tablet (500 mg total) by mouth 2 (two) times daily. (Patient not taking: Reported on 10/25/2017), Disp: 28 tablet, Rfl: 1 .  Oxycodone HCl 10 MG TABS, Take 1 tablet (10 mg total) by mouth every 6 (six) hours as needed., Disp: 20 tablet, Rfl: 0 .  terconazole (TERAZOL 3) 0.8 % vaginal cream, Place 1 applicator vaginally at bedtime. (Patient not taking: Reported on 10/25/2017), Disp: 20 g, Rfl: 2 Allergies  Allergen Reactions  . Other Anaphylaxis and Hives     Fresh strawberries (throat swelled)  . Amoxicillin Nausea And Vomiting  . Dilaudid [Hydromorphone Hcl] Nausea And Vomiting  . Latex Swelling    No reaction with bandaids    Social History   Tobacco Use  . Smoking status: Current Every Day Smoker    Packs/day: 0.50    Years: 0.00    Pack years: 0.00    Types: Cigarettes  . Smokeless tobacco: Never Used  Substance Use Topics  . Alcohol use: No    Alcohol/week: 0.0 oz    Comment: seldom    Family History  Problem Relation Age of Onset  . Leukemia Mother   . Clotting disorder Father        blood clot  . Hypertension Sister   . Diabetes Sister   . Stroke Sister 79  . Bleeding Disorder Son  ITP "free bleeding disorder"      Review of Systems  Constitutional: negative for fatigue and weight loss Respiratory: negative for cough and wheezing Cardiovascular: negative for chest pain, fatigue and palpitations Gastrointestinal: negative for abdominal pain and change in bowel habits Musculoskeletal:negative for myalgias Neurological: negative for gait problems and tremors Behavioral/Psych: negative for abusive relationship, depression Endocrine: negative for temperature intolerance    Genitourinary:negative for abnormal menstrual periods, genital lesions, hot flashes, sexual problems and vaginal discharge Integument/breast: negative for breast lump, breast tenderness, nipple discharge and skin lesion(s)    Objective:       BP (!) 173/102 (BP Location: Left Arm, Patient Position: Sitting)   Pulse 68   Ht 5' (1.524 m)   Wt 212 lb (96.2 kg)   LMP  (LMP Unknown) Comment: verified BEFORE imaging  BMI 41.40 kg/m  General:   alert  Skin:   no rash or abnormalities  Lungs:   clear to auscultation bilaterally  Heart:   regular rate and rhythm, S1, S2 normal, no murmur, click, rub or gallop  Breasts:   normal without suspicious masses, skin or nipple changes or axillary nodes  Abdomen:  normal findings: no  organomegaly, soft, non-tender and no hernia  Pelvis:  External genitalia: normal general appearance Urinary system: urethral meatus normal and bladder without fullness, nontender Vaginal: normal without tenderness, induration or masses Cervix: normal appearance Adnexa: normal bimanual exam Uterus: anteverted and non-tender, normal size   Lab Review Urine pregnancy test Labs reviewed yes Radiologic studies reviewed yes  50% of 20 min visit spent on counseling and coordination of care.   Assessment:     1. Encounter for routine gynecological examination with Papanicolaou smear of cervix Rx: - Cytology - PAP  2. Exposure to trichomonas - wet prep done  3. Vaginal discharge Rx - Cervicovaginal ancillary only  4. Chronic pelvic pain syndrome in female - needs referral for pain management  5. Hypertension - followed by PCP  6. Congestive Heart Failure - followed by PCP   Plan:    Education reviewed: calcium supplements, depression evaluation, low fat, low cholesterol diet, safe sex/STD prevention, self breast exams and weight bearing exercise. Follow up in: 1 year.   Meds ordered this encounter  Medications  . Oxycodone HCl 10 MG TABS    Sig: Take 1 tablet (10 mg total) by mouth every 6 (six) hours as needed.    Dispense:  20 tablet    Refill:  0   No orders of the defined types were placed in this encounter.   [name] MD

## 2017-11-03 ENCOUNTER — Encounter (HOSPITAL_COMMUNITY): Payer: Self-pay | Admitting: Emergency Medicine

## 2017-11-03 DIAGNOSIS — G4489 Other headache syndrome: Secondary | ICD-10-CM | POA: Diagnosis not present

## 2017-11-03 DIAGNOSIS — I252 Old myocardial infarction: Secondary | ICD-10-CM | POA: Diagnosis not present

## 2017-11-03 DIAGNOSIS — R1110 Vomiting, unspecified: Secondary | ICD-10-CM | POA: Insufficient documentation

## 2017-11-03 DIAGNOSIS — F17210 Nicotine dependence, cigarettes, uncomplicated: Secondary | ICD-10-CM | POA: Insufficient documentation

## 2017-11-03 DIAGNOSIS — I10 Essential (primary) hypertension: Secondary | ICD-10-CM | POA: Insufficient documentation

## 2017-11-03 DIAGNOSIS — R1084 Generalized abdominal pain: Secondary | ICD-10-CM | POA: Diagnosis not present

## 2017-11-03 DIAGNOSIS — Z7902 Long term (current) use of antithrombotics/antiplatelets: Secondary | ICD-10-CM | POA: Insufficient documentation

## 2017-11-03 DIAGNOSIS — R197 Diarrhea, unspecified: Secondary | ICD-10-CM | POA: Insufficient documentation

## 2017-11-03 DIAGNOSIS — Z79899 Other long term (current) drug therapy: Secondary | ICD-10-CM | POA: Diagnosis not present

## 2017-11-03 DIAGNOSIS — I2510 Atherosclerotic heart disease of native coronary artery without angina pectoris: Secondary | ICD-10-CM | POA: Insufficient documentation

## 2017-11-03 DIAGNOSIS — J45909 Unspecified asthma, uncomplicated: Secondary | ICD-10-CM | POA: Insufficient documentation

## 2017-11-03 DIAGNOSIS — Z91040 Latex allergy status: Secondary | ICD-10-CM | POA: Insufficient documentation

## 2017-11-03 LAB — CBC
HCT: 43.8 % (ref 36.0–46.0)
Hemoglobin: 15.1 g/dL — ABNORMAL HIGH (ref 12.0–15.0)
MCH: 29.2 pg (ref 26.0–34.0)
MCHC: 34.5 g/dL (ref 30.0–36.0)
MCV: 84.6 fL (ref 78.0–100.0)
PLATELETS: 250 10*3/uL (ref 150–400)
RBC: 5.18 MIL/uL — ABNORMAL HIGH (ref 3.87–5.11)
RDW: 16.3 % — AB (ref 11.5–15.5)
WBC: 11.2 10*3/uL — ABNORMAL HIGH (ref 4.0–10.5)

## 2017-11-03 LAB — URINALYSIS, ROUTINE W REFLEX MICROSCOPIC
Bacteria, UA: NONE SEEN
Bilirubin Urine: NEGATIVE
Glucose, UA: NEGATIVE mg/dL
Hgb urine dipstick: NEGATIVE
KETONES UR: NEGATIVE mg/dL
Leukocytes, UA: NEGATIVE
Nitrite: NEGATIVE
PH: 6 (ref 5.0–8.0)
Protein, ur: 100 mg/dL — AB
Specific Gravity, Urine: 1.02 (ref 1.005–1.030)

## 2017-11-03 NOTE — ED Triage Notes (Addendum)
Patient presents with multiple complaints : headache with emesis , upper back pain and generalized body aches onset today , denies diarrhea , no fever or chills . She received Zofran 4 mg IV by EMS prior to arrival .

## 2017-11-04 ENCOUNTER — Emergency Department (HOSPITAL_COMMUNITY): Payer: Medicaid Other

## 2017-11-04 ENCOUNTER — Emergency Department (HOSPITAL_COMMUNITY)
Admission: EM | Admit: 2017-11-04 | Discharge: 2017-11-04 | Disposition: A | Discharge status: Home / Self Care | Payer: Medicaid Other | Attending: Emergency Medicine | Admitting: Emergency Medicine

## 2017-11-04 DIAGNOSIS — G4489 Other headache syndrome: Secondary | ICD-10-CM

## 2017-11-04 DIAGNOSIS — R197 Diarrhea, unspecified: Secondary | ICD-10-CM

## 2017-11-04 DIAGNOSIS — R1110 Vomiting, unspecified: Secondary | ICD-10-CM

## 2017-11-04 DIAGNOSIS — R1084 Generalized abdominal pain: Secondary | ICD-10-CM

## 2017-11-04 LAB — COMPREHENSIVE METABOLIC PANEL
ALK PHOS: 76 U/L (ref 38–126)
ALT: 23 U/L (ref 14–54)
AST: 22 U/L (ref 15–41)
Albumin: 4.1 g/dL (ref 3.5–5.0)
Anion gap: 12 (ref 5–15)
BILIRUBIN TOTAL: 0.7 mg/dL (ref 0.3–1.2)
BUN: 11 mg/dL (ref 6–20)
CALCIUM: 9.4 mg/dL (ref 8.9–10.3)
CO2: 24 mmol/L (ref 22–32)
CREATININE: 0.96 mg/dL (ref 0.44–1.00)
Chloride: 96 mmol/L — ABNORMAL LOW (ref 101–111)
Glucose, Bld: 125 mg/dL — ABNORMAL HIGH (ref 65–99)
Potassium: 3.1 mmol/L — ABNORMAL LOW (ref 3.5–5.1)
Sodium: 132 mmol/L — ABNORMAL LOW (ref 135–145)
Total Protein: 7.6 g/dL (ref 6.5–8.1)

## 2017-11-04 LAB — LIPASE, BLOOD: Lipase: 33 U/L (ref 11–51)

## 2017-11-04 LAB — TROPONIN I

## 2017-11-04 MED ORDER — MORPHINE SULFATE (PF) 4 MG/ML IV SOLN
2.0000 mg | Freq: Once | INTRAVENOUS | Status: AC
  Administered 2017-11-04: 2 mg via INTRAVENOUS
  Filled 2017-11-04: qty 1

## 2017-11-04 MED ORDER — METOCLOPRAMIDE HCL 5 MG/ML IJ SOLN
10.0000 mg | Freq: Once | INTRAMUSCULAR | Status: AC
  Administered 2017-11-04: 10 mg via INTRAVENOUS
  Filled 2017-11-04: qty 2

## 2017-11-04 MED ORDER — DIPHENHYDRAMINE HCL 50 MG/ML IJ SOLN
25.0000 mg | Freq: Once | INTRAMUSCULAR | Status: AC
  Administered 2017-11-04: 25 mg via INTRAVENOUS
  Filled 2017-11-04: qty 1

## 2017-11-04 MED ORDER — FENTANYL CITRATE (PF) 100 MCG/2ML IJ SOLN
50.0000 ug | Freq: Once | INTRAMUSCULAR | Status: AC
  Administered 2017-11-04: 50 ug via INTRAVENOUS
  Filled 2017-11-04: qty 2

## 2017-11-04 MED ORDER — SODIUM CHLORIDE 0.9 % IV BOLUS (SEPSIS)
1000.0000 mL | Freq: Once | INTRAVENOUS | Status: AC
Start: 2017-11-04 — End: 2017-11-04
  Administered 2017-11-04: 1000 mL via INTRAVENOUS

## 2017-11-04 MED ORDER — ONDANSETRON HCL 4 MG/2ML IJ SOLN
4.0000 mg | Freq: Once | INTRAMUSCULAR | Status: AC
  Administered 2017-11-04: 4 mg via INTRAVENOUS
  Filled 2017-11-04: qty 2

## 2017-11-04 NOTE — ED Provider Notes (Signed)
[facility] EMERGENCY DEPARTMENT Provider Note   CSN: [id number] Arrival date & time: 11/03/17  2327     History   Chief Complaint Chief Complaint  Patient presents with  . Emesis  . Headache    HPI Tracey Manning is a 56 y.o. female.  The history is provided by the patient.  Emesis   This is a new problem. The problem has been gradually worsening. There has been no fever. Associated symptoms include abdominal pain, diarrhea, headaches and myalgias. Pertinent negatives include no fever.  Headache   This is a new problem. The current episode started yesterday. The problem occurs constantly. The problem has been gradually worsening. The headache is associated with nothing. The pain is moderate. Associated symptoms include vomiting. Pertinent negatives include no fever.  Patient with h/o CAD, depression presents with multiple complaints:  She first reports headache that started yesterday, reports it feels similar to prior headaches She reports associated nausea vomiting and one episode of small blood clot, but her emesis was mostly nonbloody She also reports that the "thing in my throat" looks bigger and she had to push it back She also reports using "pepper "to help her vomit recently  She did receive the reports is been having some intermittent chest pain as well She also reports has been having some diffuse abdominal pain as well as diarrhea   Past Medical History:  Diagnosis Date  . Anxiety   . Arthritis   . Asthma   . Chest pain   . Collagen vascular disease ([place])   . Coronary artery disease   . Depression   . Dyslipidemia   . GERD (gastroesophageal reflux disease)    occ  . Hypertension   . Myocardial infarct ([place]) 1157,2620  . Sciatica   . Sleep apnea    no CPAP ordered but using oxygen at bedtime  . Tobacco abuse     Patient Active Problem List   Diagnosis Date Noted  . Abnormal uterine bleeding (AUB) 04/03/2016  . Chest pain  syndrome 11/14/2015  . Dysfunctional uterine bleeding 11/14/2015  . Right sided abdominal pain 11/14/2015  . Right anterior shoulder pain 11/14/2015  . Pelvic pain in female 11/14/2015  . Bacterial vaginosis (recurrent) 11/14/2015  . Chest pain 11/14/2015  . Essential hypertension 04/04/2014  . Hyperlipidemia 04/04/2014  . Coronary artery disease 04/04/2014    Past Surgical History:  Procedure Laterality Date  . CARDIAC CATHETERIZATION  10/09/2003   [place], [place]) - LAD with 30% prox narrowing, 50% stenosis in mid-portion of PLA; RCA with 40% narrowing proximally ([name], [name])  . CARDIAC CATHETERIZATION  10/10/2007   70% stenosis in first septal perforator branch of LAD, 60-70% narrowing in mid LAD, 20% narrowing in mid AV groove Cfx with 80% diffuse narrowing in small distal marginal, total occlusion of mid RCA with L to R collaterals, 90% stenosis diffusely in prox branch of RCA followed by 70% stenosis in secondary curve & 80% stenosis in small marginal branch ([name])  . CARDIAC CATHETERIZATION  05/28/2008   normal L main, RCA with 100% prox lesion w/distal filling from LAD collaterals, LAD with 20% prox tubular lesion/ 40% mid LAD lesion/previous stent patent ([name])  . CARDIAC CATHETERIZATION  09/15/2009   discrete 100% osital RCA lesion, 50% prox LAD lesion, non-obstructive disease in all coronaries ([name])  . CESAREAN SECTION    . COLONOSCOPY    . COLONOSCOPY W/ POLYPECTOMY    .  CORONARY ANGIOPLASTY WITH STENT PLACEMENT  10/31/2007   PCI of distal Cfx with 2.25x66mm Taxus Adam DES, 60% narrowing of mid LAD ([name])  . CORONARY ANGIOPLASTY WITH STENT PLACEMENT  06/11/2011   PCI of prox-mid LAD with 3x30mm DES Resolute ([name])  . DILATION AND CURETTAGE, DIAGNOSTIC / THERAPEUTIC    . DILITATION & CURRETTAGE/HYSTROSCOPY WITH HYDROTHERMAL ABLATION N/A 04/03/2016   Procedure: DILATATION & CURETTAGE/HYSTEROSCOPY WITH  HYDROTHERMAL ABLATION;  Surgeon: [name], MD;  Location: [facility];  Service: Gynecology;  Laterality: N/A;  . NM MYOCAR PERF WALL MOTION  08/2009   persantine myoview - normal perfusion in all regions, perfusion defect in anterior region (breast attenuation), EF 52%, low risk scan    OB History    No data available       Home Medications    Prior to Admission medications   Medication Sig Start Date End Date Taking? Authorizing Provider  albuterol (PROVENTIL HFA;VENTOLIN HFA) 108 (90 BASE) MCG/ACT inhaler Inhale 2 puffs into the lungs every 6 (six) hours as needed for wheezing or shortness of breath.    [provider]  amLODipine (NORVASC) 10 MG tablet Take 10 mg by mouth daily.    [provider]  aspirin EC 325 MG tablet Take 325 mg by mouth daily.    [provider]  atorvastatin (LIPITOR) 40 MG tablet Take 1 tablet (40 mg total) by mouth daily. 01/04/14   [name], MD  clopidogrel (PLAVIX) 75 MG tablet Take 1 tablet (75 mg total) by mouth daily with breakfast. 01/04/14   [name], MD  doxycycline (VIBRA-TABS) 100 MG tablet Take 1 tablet (100 mg total) by mouth 2 (two) times daily. Patient not taking: Reported on 10/25/2017 07/08/17   [name], MD  erythromycin ophthalmic ointment Place 1 application into both eyes every 6 (six) hours. Place 1/2 inch ribbon of ointment in the affected eye 4 times a day 07/04/17   [name], PA-C  fluticasone-salmeterol (ADVAIR HFA) [phone] MCG/ACT inhaler Inhale 2 puffs into the lungs 2 (two) times daily. 01/04/14   [name], MD  lisinopril-hydrochlorothiazide (PRINZIDE,ZESTORETIC) 20-12.5 MG per tablet Take 2 tablets by mouth daily.    [provider]  methocarbamol (ROBAXIN) 500 MG tablet Take 1 tablet (500 mg total) by mouth 2 (two) times daily. 03/30/17   [name], MD  metroNIDAZOLE (FLAGYL) 500 MG tablet Take 1 tablet (500 mg total) by mouth 2 (two) times daily. Patient  not taking: Reported on 10/25/2017 07/08/17   [name], MD  naproxen (NAPROSYN) 250 MG tablet Take 1 tablet (250 mg total) by mouth 2 (two) times daily with a meal. 07/04/17   [name], PA-C  nitroGLYCERIN (NITROSTAT) 0.4 MG SL tablet Place 1 tablet (0.4 mg total) under the tongue every 5 (five) minutes as needed for chest pain. 06/05/15   [name], MD  Oxycodone HCl 10 MG TABS Take 1 tablet (10 mg total) by mouth every 6 (six) hours as needed. 10/25/17   [name], MD  sertraline (ZOLOFT) 50 MG tablet Take 50 mg by mouth daily.    [provider]  terconazole (TERAZOL 3) 0.8 % vaginal cream Place 1 applicator vaginally at bedtime. Patient not taking: Reported on 10/25/2017 07/08/17   [name], MD  zolpidem (AMBIEN) 10 MG tablet Take 10 mg by mouth at bedtime as needed for sleep.    [provider]  Family History Family History  Problem Relation Age of Onset  . Leukemia Mother   . Clotting disorder Father        blood clot  . Hypertension Sister   . Diabetes Sister   . Stroke Sister 35  . Bleeding Disorder Son        ITP "free bleeding disorder"    Social History Social History   Tobacco Use  . Smoking status: Current Every Day Smoker    Packs/day: 0.50    Years: 0.00    Pack years: 0.00    Types: Cigarettes  . Smokeless tobacco: Never Used  Substance Use Topics  . Alcohol use: No    Alcohol/week: 0.0 oz    Comment: seldom  . Drug use: No    Comment: Occassionally, last time April 2017     Allergies   Other; Amoxicillin; Dilaudid [hydromorphone hcl]; and Latex   Review of Systems Review of Systems  Constitutional: Negative for fever.  Cardiovascular: Positive for chest pain.  Gastrointestinal: Positive for abdominal pain, diarrhea and vomiting.  Musculoskeletal: Positive for back pain and myalgias.  Neurological: Positive for headaches.  All other systems reviewed and are negative.    Physical  Exam Updated Vital Signs BP (!) 147/103   Pulse 81   Temp (!) 97.5 F (36.4 C) (Oral)   Resp 18   LMP  (LMP Unknown) Comment: verified BEFORE imaging  SpO2 100%   Physical Exam CONSTITUTIONAL: Mildly anxious, appears older than stated age HEAD: Normocephalic/atraumatic EYES: EOMI/PERRL, no nystagmus, no ptosis ENMT: Mucous membranes moist NECK: supple no meningeal signs, no bruits CV: S1/S2 noted, no murmurs/rubs/gallops noted LUNGS: Lungs are clear to auscultation bilaterally, no apparent distress Chest:  Diffuse chest wall tenderness ABDOMEN: soft, moderate left upper quadrant left lower quadrant tenderness, no rebound or guarding GU:no cva tenderness NEURO:Awake/alert, face symmetric, no arm or leg drift is noted Cranial nerves 3/4/5/6/05/31/09/11/12 tested and intact EXTREMITIES: pulses normal, full ROM SKIN: warm, color normal PSYCH: anxious  ED Treatments / Results  Labs (all labs ordered are listed, but only abnormal results are displayed) Labs Reviewed  COMPREHENSIVE METABOLIC PANEL - Abnormal; Notable for the following components:      Result Value   Sodium 132 (*)    Potassium 3.1 (*)    Chloride 96 (*)    Glucose, Bld 125 (*)    All other components within normal limits  CBC - Abnormal; Notable for the following components:   WBC 11.2 (*)    RBC 5.18 (*)    Hemoglobin 15.1 (*)    RDW 16.3 (*)    All other components within normal limits  URINALYSIS, ROUTINE W REFLEX MICROSCOPIC - Abnormal; Notable for the following components:   APPearance HAZY (*)    Protein, ur 100 (*)    Squamous Epithelial / LPF 6-30 (*)    All other components within normal limits  LIPASE, BLOOD  TROPONIN I    EKG  EKG Interpretation  Date/Time:  Thursday November 04 2017 03:46:14 EST Ventricular Rate:  71 PR Interval:    QRS Duration: 107 QT Interval:  430 QTC Calculation: 468 R Axis:   76 Text Interpretation:  Sinus rhythm Left atrial enlargement No significant change  since last tracing Confirmed by Ripley Fraise [phone]) on 11/04/2017 3:57:37 AM       Radiology Dg Abd Acute W/chest  Result Date: 11/04/2017 CLINICAL DATA:  Acute onset of headache and vomiting. Upper back pain and generalized body aches. EXAM: DG  ABDOMEN ACUTE W/ 1V CHEST COMPARISON:  Chest radiograph performed 10/06/2017 FINDINGS: The lungs are well-aerated and clear. There is no evidence of focal opacification, pleural effusion or pneumothorax. The cardiomediastinal silhouette is mildly enlarged. The visualized bowel gas pattern is unremarkable. Scattered stool and air are seen within the colon; there is no evidence of small bowel dilatation to suggest obstruction. No free intra-abdominal air is identified on the provided upright view. No acute osseous abnormalities are seen; the sacroiliac joints are unremarkable in appearance. IMPRESSION: 1. Unremarkable bowel gas pattern; no free intra-abdominal air seen. Moderate amount of stool noted in the colon. 2. Mild cardiomegaly.  Lungs remain grossly clear. Electronically Signed   By: Garald Balding M.D.   On: 11/04/2017 05:29    Procedures Procedures (including critical care time)  Medications Ordered in ED Medications  fentaNYL (SUBLIMAZE) injection 50 mcg (50 mcg Intravenous Given 11/04/17 0444)  ondansetron (ZOFRAN) injection 4 mg (4 mg Intravenous Given 11/04/17 0444)  sodium chloride 0.9 % bolus 1,000 mL (1,000 mLs Intravenous New Bag/Given 11/04/17 0443)  metoCLOPramide (REGLAN) injection 10 mg (10 mg Intravenous Given 11/04/17 2025)  diphenhydrAMINE (BENADRYL) injection 25 mg (25 mg Intravenous Given 11/04/17 0637)  morphine 4 MG/ML injection 2 mg (2 mg Intravenous Given 11/04/17 4270)     Initial Impression / Assessment and Plan / ED Course  I have reviewed the triage vital signs and the nursing notes.  Pertinent labs & imaging results that were available during my care of the patient were reviewed by me and considered in my  medical decision making (see chart for details).     4:37 AM Patient Presents with multiple complaints which makes her history very difficult. When I asked her which pain is most severe she reports her headache and abdominal pain Her headache there is no focal neuro deficits and she reports previous history of headaches, will defer further workup at this time She does have diffuse abdominal pain mostly on left side We will start acute abdominal series and then reassess 6:43 AM She reports minimal improvement Reports her main issue is her headache, she states her abdominal pain is a frequent occurrence She is requesting a "cocktail" for her migraines Reports Reglan, Benadryl, morphine will improve her headache 7:10 AM Patient improved and resting comfortably, reports her headache is resolved She has no focal neurologic deficits to suggest stroke Reports she has had these headaches previously She has no other new complaints Discharge home, we discussed strict return precautions   Final Clinical Impressions(s) / ED Diagnoses   Final diagnoses:  Vomiting and diarrhea  Generalized abdominal pain  Other headache syndrome    ED Discharge Orders    None       Ripley Fraise, MD 11/04/17 0710

## 2017-11-04 NOTE — Discharge Instructions (Signed)

## 2017-12-07 ENCOUNTER — Other Ambulatory Visit (HOSPITAL_COMMUNITY): Payer: Self-pay | Admitting: Neurosurgery

## 2017-12-07 ENCOUNTER — Other Ambulatory Visit: Payer: Self-pay | Admitting: Neurosurgery

## 2017-12-07 DIAGNOSIS — M48061 Spinal stenosis, lumbar region without neurogenic claudication: Secondary | ICD-10-CM

## 2017-12-07 DIAGNOSIS — M5412 Radiculopathy, cervical region: Secondary | ICD-10-CM

## 2017-12-10 ENCOUNTER — Other Ambulatory Visit: Payer: Self-pay

## 2017-12-10 ENCOUNTER — Emergency Department (HOSPITAL_COMMUNITY): Payer: Medicaid Other

## 2017-12-10 ENCOUNTER — Emergency Department (HOSPITAL_COMMUNITY)
Admission: EM | Admit: 2017-12-10 | Discharge: 2017-12-10 | Discharge status: Against Medical Advice | Payer: Medicaid Other | Attending: Physician Assistant | Admitting: Physician Assistant

## 2017-12-10 ENCOUNTER — Encounter (HOSPITAL_COMMUNITY): Payer: Self-pay

## 2017-12-10 DIAGNOSIS — F17210 Nicotine dependence, cigarettes, uncomplicated: Secondary | ICD-10-CM | POA: Diagnosis not present

## 2017-12-10 DIAGNOSIS — Z7901 Long term (current) use of anticoagulants: Secondary | ICD-10-CM | POA: Insufficient documentation

## 2017-12-10 DIAGNOSIS — I2510 Atherosclerotic heart disease of native coronary artery without angina pectoris: Secondary | ICD-10-CM | POA: Diagnosis not present

## 2017-12-10 DIAGNOSIS — Z79899 Other long term (current) drug therapy: Secondary | ICD-10-CM | POA: Diagnosis not present

## 2017-12-10 DIAGNOSIS — J45909 Unspecified asthma, uncomplicated: Secondary | ICD-10-CM | POA: Diagnosis not present

## 2017-12-10 DIAGNOSIS — Z7982 Long term (current) use of aspirin: Secondary | ICD-10-CM | POA: Insufficient documentation

## 2017-12-10 DIAGNOSIS — I10 Essential (primary) hypertension: Secondary | ICD-10-CM | POA: Diagnosis not present

## 2017-12-10 DIAGNOSIS — J189 Pneumonia, unspecified organism: Secondary | ICD-10-CM | POA: Insufficient documentation

## 2017-12-10 DIAGNOSIS — R079 Chest pain, unspecified: Secondary | ICD-10-CM | POA: Diagnosis present

## 2017-12-10 LAB — BASIC METABOLIC PANEL WITH GFR
Anion gap: 11 (ref 5–15)
BUN: 10 mg/dL (ref 6–20)
CO2: 24 mmol/L (ref 22–32)
Calcium: 8.9 mg/dL (ref 8.9–10.3)
Chloride: 103 mmol/L (ref 101–111)
Creatinine, Ser: 0.89 mg/dL (ref 0.44–1.00)
GFR calc Af Amer: >60 mL/min
GFR calc non Af Amer: >60 mL/min
Glucose, Bld: 117 mg/dL — ABNORMAL HIGH (ref 65–99)
Potassium: 3.1 mmol/L — ABNORMAL LOW (ref 3.5–5.1)
Sodium: 138 mmol/L (ref 135–145)

## 2017-12-10 LAB — I-STAT TROPONIN, ED: Troponin i, poc: 0 ng/mL (ref 0.00–0.08)

## 2017-12-10 LAB — CBC
HEMATOCRIT: 38.2 % (ref 36.0–46.0)
Hemoglobin: 13.1 g/dL (ref 12.0–15.0)
MCH: 29.5 pg (ref 26.0–34.0)
MCHC: 34.3 g/dL (ref 30.0–36.0)
MCV: 86 fL (ref 78.0–100.0)
Platelets: 208 10*3/uL (ref 150–400)
RBC: 4.44 MIL/uL (ref 3.87–5.11)
RDW: 16 % — AB (ref 11.5–15.5)
WBC: 7 10*3/uL (ref 4.0–10.5)

## 2017-12-10 LAB — I-STAT BETA HCG BLOOD, ED (MC, WL, AP ONLY)

## 2017-12-10 MED ORDER — MORPHINE SULFATE (PF) 4 MG/ML IV SOLN: 2.0000 mg | Freq: Once | INTRAVENOUS | Status: DC

## 2017-12-10 MED ORDER — PROCHLORPERAZINE EDISYLATE 5 MG/ML IJ SOLN: 10.0000 mg | Freq: Once | INTRAMUSCULAR | Status: DC

## 2017-12-10 MED ORDER — DIPHENHYDRAMINE HCL 50 MG/ML IJ SOLN: 25.0000 mg | Freq: Once | INTRAMUSCULAR | Status: DC

## 2017-12-10 MED ORDER — KETOROLAC TROMETHAMINE 30 MG/ML IJ SOLN
30.0000 mg | Freq: Once | INTRAMUSCULAR | Status: AC
  Administered 2017-12-10: 30 mg via INTRAVENOUS
  Filled 2017-12-10: qty 1

## 2017-12-10 MED ORDER — AZITHROMYCIN 250 MG PO TABS
250.0000 mg | ORAL_TABLET | Freq: Every day | ORAL | 0 refills | Status: DC
Start: 2017-12-10 — End: 2017-12-20

## 2017-12-10 MED ORDER — AZITHROMYCIN 250 MG PO TABS
500.0000 mg | ORAL_TABLET | Freq: Once | ORAL | Status: AC
  Administered 2017-12-10: 500 mg via ORAL
  Filled 2017-12-10: qty 2

## 2017-12-10 MED ORDER — SODIUM CHLORIDE 0.9 % IV BOLUS (SEPSIS): 1000.0000 mL | Freq: Once | INTRAVENOUS | Status: DC

## 2017-12-10 NOTE — ED Notes (Signed)
Patient transported to X-ray 

## 2017-12-10 NOTE — Discharge Instructions (Signed)
Your x-ray was abnormal.  It shows questionable pneumonia.  The pneumonia does not appear typical.  It may be that there is something underneath it such as cancer.  You need to have your chest x-ray repeated in 2-4 weeks.

## 2017-12-10 NOTE — ED Notes (Signed)
ED Provider at bedside. 

## 2017-12-10 NOTE — ED Provider Notes (Signed)
[facility] EMERGENCY DEPARTMENT Provider Note   CSN: [id number] Arrival date & time: 12/10/17  1927     History   Chief Complaint Chief Complaint  Patient presents with  . Chest Pain    HPI Tracey Manning is a 57 y.o. female.  HPI    Patient is a very pleasant 57 year old female presenting with several different complaints.  Patient has noted some sore throat and cough.  Has been feeling more tired than usual.  And also is here with a migraine.  Patient reports his normal migraine.  No nausea no vomiting.  No fevers.  No weight loss.  Past Medical History:  Diagnosis Date  . Anxiety   . Arthritis   . Asthma   . Chest pain   . Collagen vascular disease ([place])   . Coronary artery disease   . Depression   . Dyslipidemia   . GERD (gastroesophageal reflux disease)    occ  . Hypertension   . Myocardial infarct ([place]) [id number]  . Sciatica   . Sleep apnea    no CPAP ordered but using oxygen at bedtime  . Tobacco abuse     Patient Active Problem List   Diagnosis Date Noted  . Abnormal uterine bleeding (AUB) 04/03/2016  . Chest pain syndrome 11/14/2015  . Dysfunctional uterine bleeding 11/14/2015  . Right sided abdominal pain 11/14/2015  . Right anterior shoulder pain 11/14/2015  . Pelvic pain in female 11/14/2015  . Bacterial vaginosis (recurrent) 11/14/2015  . Chest pain 11/14/2015  . Essential hypertension 04/04/2014  . Hyperlipidemia 04/04/2014  . Coronary artery disease 04/04/2014    Past Surgical History:  Procedure Laterality Date  . CARDIAC CATHETERIZATION  10/09/2003   [place], [place]) - LAD with 30% prox narrowing, 50% stenosis in mid-portion of PLA; RCA with 40% narrowing proximally ([name], [name])  . CARDIAC CATHETERIZATION  10/10/2007   70% stenosis in first septal perforator branch of LAD, 60-70% narrowing in mid LAD, 20% narrowing in mid AV groove Cfx with 80% diffuse narrowing in small distal marginal,  total occlusion of mid RCA with L to R collaterals, 90% stenosis diffusely in prox branch of RCA followed by 70% stenosis in secondary curve & 80% stenosis in small marginal branch ([name])  . CARDIAC CATHETERIZATION  05/28/2008   normal L main, RCA with 100% prox lesion w/distal filling from LAD collaterals, LAD with 20% prox tubular lesion/ 40% mid LAD lesion/previous stent patent ([name])  . CARDIAC CATHETERIZATION  09/15/2009   discrete 100% osital RCA lesion, 50% prox LAD lesion, non-obstructive disease in all coronaries ([name])  . CESAREAN SECTION    . COLONOSCOPY    . COLONOSCOPY W/ POLYPECTOMY    . CORONARY ANGIOPLASTY WITH STENT PLACEMENT  10/31/2007   PCI of distal Cfx with 2.25x14mm Taxus Adam DES, 60% narrowing of mid LAD ([name])  . CORONARY ANGIOPLASTY WITH STENT PLACEMENT  06/11/2011   PCI of prox-mid LAD with 3x9mm DES Resolute ([name])  . DILATION AND CURETTAGE, DIAGNOSTIC / THERAPEUTIC    . DILITATION & CURRETTAGE/HYSTROSCOPY WITH HYDROTHERMAL ABLATION N/A 04/03/2016   Procedure: DILATATION & CURETTAGE/HYSTEROSCOPY WITH HYDROTHERMAL ABLATION;  Surgeon: [name], MD;  Location: [facility];  Service: Gynecology;  Laterality: N/A;  . NM MYOCAR PERF WALL MOTION  08/2009   persantine myoview - normal perfusion in all regions, perfusion defect in anterior region (breast attenuation), EF 52%, low risk scan  OB History    No data available       Home Medications    Prior to Admission medications   Medication Sig Start Date End Date Taking? Authorizing Provider  albuterol (PROVENTIL HFA;VENTOLIN HFA) 108 (90 BASE) MCG/ACT inhaler Inhale 2 puffs into the lungs every 6 (six) hours as needed for wheezing or shortness of breath.    [provider]  amLODipine (NORVASC) 10 MG tablet Take 10 mg by mouth daily.    [provider]  aspirin EC 325 MG tablet Take 325 mg by mouth daily.    [provider]  atorvastatin  (LIPITOR) 40 MG tablet Take 1 tablet (40 mg total) by mouth daily. 01/04/14   [name], MD  clopidogrel (PLAVIX) 75 MG tablet Take 1 tablet (75 mg total) by mouth daily with breakfast. 01/04/14   [name], MD  fluticasone-salmeterol (ADVAIR [facility]) [phone] MCG/ACT inhaler Inhale 2 puffs into the lungs 2 (two) times daily. 01/04/14   [name], MD  lisinopril-hydrochlorothiazide (PRINZIDE,ZESTORETIC) 20-12.5 MG per tablet Take 2 tablets by mouth daily.    [provider]  Nebivolol HCl (BYSTOLIC PO) Take 1 tablet by mouth daily.    [provider]  nitroGLYCERIN (NITROSTAT) 0.4 MG SL tablet Place 1 tablet (0.4 mg total) under the tongue every 5 (five) minutes as needed for chest pain. 06/05/15   [name], MD  Oxycodone HCl 10 MG TABS Take 1 tablet (10 mg total) by mouth every 6 (six) hours as needed. 10/25/17   [name], MD  sertraline (ZOLOFT) 50 MG tablet Take 50 mg by mouth daily.    [provider]  terconazole (TERAZOL 3) 0.8 % vaginal cream Place 1 applicator vaginally at bedtime. Patient not taking: Reported on 10/25/2017 07/08/17   [name], MD  zolpidem (AMBIEN) 10 MG tablet Take 10 mg by mouth at bedtime as needed for sleep.    [provider]    Family History Family History  Problem Relation Age of Onset  . Leukemia Mother   . Clotting disorder Father        blood clot  . Hypertension Sister   . Diabetes Sister   . Stroke Sister 63  . Bleeding Disorder Son        ITP "free bleeding disorder"    Social History Social History   Tobacco Use  . Smoking status: Current Every Day Smoker    Packs/day: 0.50    Years: 0.00    Pack years: 0.00    Types: Cigarettes  . Smokeless tobacco: Never Used  Substance Use Topics  . Alcohol use: No    Alcohol/week: 0.0 oz    Comment: seldom  . Drug use: No    Comment: Occassionally, last time April 2017     Allergies   Other; Amoxicillin; Dilaudid  [hydromorphone hcl]; and Latex   Review of Systems Review of Systems  Constitutional: Positive for fatigue. Negative for activity change and fever.  HENT: Positive for congestion and sore throat.   Respiratory: Positive for cough. Negative for shortness of breath.   Cardiovascular: Negative for chest pain.  Gastrointestinal: Negative for abdominal pain.  All other systems reviewed and are negative.    Physical Exam Updated Vital Signs BP (!) 186/106   Pulse 70   Temp 98.5 F (36.9 C) (Oral)   Resp (!) 21   Ht 5\' 1"  (1.549 m)   Wt 96.2 kg (212 lb)   LMP  (  LMP Unknown) Comment: verified BEFORE imaging  SpO2 100%   BMI 40.06 kg/m   Physical Exam  Constitutional: She is oriented to person, place, and time. She appears well-developed and well-nourished.  HENT:  Head: Normocephalic and atraumatic.  Eyes: EOM are normal. Pupils are equal, round, and reactive to light. Right eye exhibits no discharge.  Cardiovascular: Normal rate, regular rhythm, normal heart sounds and normal pulses.  No murmur heard. Pulmonary/Chest: Effort normal and breath sounds normal. No accessory muscle usage. No respiratory distress. She has no wheezes. She has no rales.  Abdominal: Soft. She exhibits no distension. There is no tenderness.  Neurological: She is oriented to person, place, and time.  Skin: Skin is warm and dry. She is not diaphoretic.  Psychiatric: She has a normal mood and affect.  Nursing note and vitals reviewed.    ED Treatments / Results  Labs (all labs ordered are listed, but only abnormal results are displayed) Labs Reviewed  BASIC METABOLIC PANEL - Abnormal; Notable for the following components:      Result Value   Potassium 3.1 (*)    Glucose, Bld 117 (*)    All other components within normal limits  CBC - Abnormal; Notable for the following components:   RDW 16.0 (*)    All other components within normal limits  I-STAT TROPONIN, ED  I-STAT BETA HCG BLOOD, ED ([facility], [facility],  [facility] ONLY)    EKG  EKG Interpretation None       Radiology Dg Chest 2 View  Result Date: 12/10/2017 CLINICAL DATA:  Chest pain.  Smoker EXAM: CHEST  2 VIEW COMPARISON:  Radiograph 10/06/2017 FINDINGS: Normal cardiac silhouette. On the frontal projection lungs are clear. On the lateral projection there is increased density over the anterior margin of the lower thoracic spine. There are osteophytes at this level howver the density appears greater than the osteophytosis. IMPRESSION: Concern for lower lobe pneumonia or mass. Followup PA and lateral chest X-ray is recommended in 3-4 weeks following trial of antibiotic therapy to ensure resolution and exclude underlying malignancy. Electronically Signed   By: Suzy Bouchard M.D.   On: 12/10/2017 20:10    Procedures Procedures (including critical care time)  Medications Ordered in ED Medications  azithromycin (ZITHROMAX) tablet 500 mg (not administered)     Initial Impression / Assessment and Plan / ED Course  I have reviewed the triage vital signs and the nursing notes.  Pertinent labs & imaging results that were available during my care of the patient were reviewed by me and considered in my medical decision making (see chart for details).     Patient is a very pleasant 57 year old female presenting with several different complaints.  Patient has noted some sore throat and cough.  Has been feeling more tired than usual.  And also is here with a migraine.  Patient reports his normal migraine.  No nausea no vomiting.  No fevers.  No weight loss.   Chest x-ray shows questionable pneumonia we will treat with as azithro given her symptoms.  We will also give migraine cocktail.  10:00 PM  Patient does not want to wait for urine cocktail.  Will discharge her with antibiotics Explained that she has had follow-up x-ray.  Final Clinical Impressions(s) / ED Diagnoses   Final diagnoses:  None    ED Discharge Orders    None         Tracey Manning, Fredia Sorrow, MD 12/10/17 2200

## 2017-12-10 NOTE — ED Triage Notes (Addendum)
Per [facility], pt complains of central chest pain after waking up from her nap around 4:30-5 pm. Pt c/o severe left sided HA rated as 10/10 starting around 2:30-3  Pm. Pt states central chest pain feels like a bruising pain rated initially as 10/10 that increases with laughing and movement and is now 8/10. EMS gave 324 of ASA and 0.4 of Nitro. Pt has hx of heart attack, HTN, and migraines. Pt is alert and oriented and ambulatory. Pt is c/o of some increased SOB.

## 2017-12-10 NOTE — ED Notes (Signed)
Note charted on wrong pt chart. Pt not aggressive. Pt calm and cooperative.

## 2017-12-10 NOTE — ED Notes (Addendum)
Pt cussing in room and complaining about not receiving pain medicine. Pt requesting discharge papers. This RN attempting to de-escalate situation. MD notified.

## 2017-12-21 ENCOUNTER — Telehealth: Payer: Self-pay | Admitting: Cardiovascular Disease

## 2017-12-21 NOTE — Telephone Encounter (Signed)
Left message for [facility], no fax has been received. Our fax number left for her.

## 2017-12-21 NOTE — Telephone Encounter (Signed)
Tracey Manning is calling to find out the status of the clearance that she faxed over on 12-07-17.She will refax it over again today.Please call,she needs to discuss pt's Plavix with you.

## 2017-12-24 NOTE — Telephone Encounter (Signed)
Lexine Baton calling wants to know if second fax was received on 12-21-17-she have not received it back.She is refaxing another one today.

## 2017-12-24 NOTE — Telephone Encounter (Signed)
   [facility] Pre-operative Risk Assessment    Request for surgical clearance:  1. What type of surgery is being performed? MYELOGRAM   2. When is this surgery scheduled? NOT LISTED   3. What type of clearance is required (medical clearance vs. Pharmacy clearance to hold med vs. Both)? BOTH  4. Are there any medications that need to be held prior to surgery and how long?PLAVIX   5. Practice name and name of physician performing surgery? [place] [facility]   6. What is your office phone and fax number? [place] ATTN:NIKKI   7. Anesthesia type (None, local, MAC, general) ? NOT LISTED   Waylan Rocher 12/24/2017, 4:01 PM  _________________________________________________________________   (provider comments below)

## 2017-12-24 NOTE — Telephone Encounter (Signed)
   Primary Cardiologist:[name], MD  Chart reviewed as part of pre-operative protocol coverage. Because of Tracey Manning's past medical history and time since last visit, he/she will require a follow-up visit in order to better assess preoperative cardiovascular risk.  Pre-op covering staff: - Please schedule appointment and call patient to inform them. - Please contact requesting surgeon's office via preferred method (i.e, phone, fax) to inform them of need for appointment prior to surgery.  [name], [place]  12/24/2017, 4:23 PM

## 2017-12-24 NOTE — Telephone Encounter (Signed)
Spoke w/ patient regarding clearance and getting her scheduled. Primary cardiologist is Dr. Gwenlyn Found. No date listed on request, however patient stated her procedure is scheduled on 2/6. I had no availability before then. I scheduled her to see Dr. Gwenlyn Found on 2/27 @ 10:15 per her request. She stated she would call requesting surgeon's office and change procedure date.

## 2017-12-24 NOTE — Telephone Encounter (Signed)
Left detailed message that we still have not received any pre-op for this pt, please fax to [phone]

## 2017-12-24 NOTE — Telephone Encounter (Signed)
Spoke with Lattie Haw she states that she will fax over pre-op again. She just wanted to let us know that pt has not been taking her Plavix for 2 months now because she does not want to pay for it. Lattie Haw explained to her how important it is for her to take it and pt was not concerned, and she does not know if pt will restart. Lattie Haw just thought that we should know.

## 2017-12-24 NOTE — Telephone Encounter (Signed)
Thx

## 2017-12-26 ENCOUNTER — Emergency Department (HOSPITAL_COMMUNITY)
Admission: EM | Admit: 2017-12-26 | Discharge: 2017-12-26 | Disposition: A | Discharge status: Home / Self Care | Payer: Medicaid Other | Attending: Emergency Medicine | Admitting: Emergency Medicine

## 2017-12-26 ENCOUNTER — Emergency Department (HOSPITAL_COMMUNITY): Payer: Medicaid Other

## 2017-12-26 ENCOUNTER — Other Ambulatory Visit: Payer: Self-pay

## 2017-12-26 ENCOUNTER — Encounter (HOSPITAL_COMMUNITY): Payer: Self-pay | Admitting: Emergency Medicine

## 2017-12-26 DIAGNOSIS — F17210 Nicotine dependence, cigarettes, uncomplicated: Secondary | ICD-10-CM | POA: Insufficient documentation

## 2017-12-26 DIAGNOSIS — B9789 Other viral agents as the cause of diseases classified elsewhere: Secondary | ICD-10-CM

## 2017-12-26 DIAGNOSIS — Z91040 Latex allergy status: Secondary | ICD-10-CM | POA: Diagnosis not present

## 2017-12-26 DIAGNOSIS — J069 Acute upper respiratory infection, unspecified: Secondary | ICD-10-CM | POA: Diagnosis not present

## 2017-12-26 DIAGNOSIS — J029 Acute pharyngitis, unspecified: Secondary | ICD-10-CM | POA: Diagnosis present

## 2017-12-26 DIAGNOSIS — I10 Essential (primary) hypertension: Secondary | ICD-10-CM | POA: Insufficient documentation

## 2017-12-26 DIAGNOSIS — Z79899 Other long term (current) drug therapy: Secondary | ICD-10-CM | POA: Diagnosis not present

## 2017-12-26 DIAGNOSIS — J45909 Unspecified asthma, uncomplicated: Secondary | ICD-10-CM | POA: Diagnosis not present

## 2017-12-26 DIAGNOSIS — Z7982 Long term (current) use of aspirin: Secondary | ICD-10-CM | POA: Diagnosis not present

## 2017-12-26 DIAGNOSIS — I2510 Atherosclerotic heart disease of native coronary artery without angina pectoris: Secondary | ICD-10-CM | POA: Insufficient documentation

## 2017-12-26 LAB — INFLUENZA PANEL BY PCR (TYPE A & B)
Influenza A By PCR: NEGATIVE
Influenza B By PCR: NEGATIVE

## 2017-12-26 MED ORDER — ACETAMINOPHEN 325 MG PO TABS
650.0000 mg | ORAL_TABLET | Freq: Once | ORAL | Status: AC
Start: 2017-12-26 — End: 2017-12-26
  Administered 2017-12-26: 650 mg via ORAL
  Filled 2017-12-26: qty 2

## 2017-12-26 MED ORDER — KETOROLAC TROMETHAMINE 30 MG/ML IJ SOLN
30.0000 mg | Freq: Once | INTRAMUSCULAR | Status: AC
  Administered 2017-12-26: 30 mg via INTRAMUSCULAR
  Filled 2017-12-26: qty 1

## 2017-12-26 MED ORDER — BENZONATATE 100 MG PO CAPS
200.0000 mg | ORAL_CAPSULE | Freq: Once | ORAL | Status: AC
  Administered 2017-12-26: 200 mg via ORAL
  Filled 2017-12-26: qty 2

## 2017-12-26 NOTE — ED Notes (Signed)
Pain 10/10 - R side of head.

## 2017-12-26 NOTE — ED Provider Notes (Signed)
[facility] EMERGENCY DEPARTMENT Provider Note   CSN: [id number] Arrival date & time: 12/26/17  0429     History   Chief Complaint Chief Complaint  Patient presents with  . URI    HPI Tracey Manning is a 57 y.o. female.  HPI Tracey Manning is a 57 y.o. female with history of asthma, coronary disease, hypertension, chronic pain, presents to emergency department complaining of flulike symptoms.  Patient states she was diagnosed with pneumonia 2 weeks ago.  She finished all of the antibiotics and felt better.  She states that yesterday her symptoms return.  She is complaining of sore throat, facial pain, congestion, headache, cough.  She denies any fever, but reports chills.  No nausea or vomiting.  No diarrhea.  No chest pain.  She states she tried her breathing treatments at home and drank some tea last night which did not help.  Past Medical History:  Diagnosis Date  . Anxiety   . Arthritis   . Asthma   . Chest pain   . Collagen vascular disease ([place])   . Coronary artery disease   . Depression   . Dyslipidemia   . GERD (gastroesophageal reflux disease)    occ  . Hypertension   . Myocardial infarct ([place]) [id number]  . Sciatica   . Sleep apnea    no CPAP ordered but using oxygen at bedtime  . Tobacco abuse     Patient Active Problem List   Diagnosis Date Noted  . Abnormal uterine bleeding (AUB) 04/03/2016  . Chest pain syndrome 11/14/2015  . Dysfunctional uterine bleeding 11/14/2015  . Right sided abdominal pain 11/14/2015  . Right anterior shoulder pain 11/14/2015  . Pelvic pain in female 11/14/2015  . Bacterial vaginosis (recurrent) 11/14/2015  . Chest pain 11/14/2015  . Essential hypertension 04/04/2014  . Hyperlipidemia 04/04/2014  . Coronary artery disease 04/04/2014    Past Surgical History:  Procedure Laterality Date  . CARDIAC CATHETERIZATION  10/09/2003   [place], [place]) - LAD with 30% prox narrowing, 50%  stenosis in mid-portion of PLA; RCA with 40% narrowing proximally ([name], [name])  . CARDIAC CATHETERIZATION  10/10/2007   70% stenosis in first septal perforator branch of LAD, 60-70% narrowing in mid LAD, 20% narrowing in mid AV groove Cfx with 80% diffuse narrowing in small distal marginal, total occlusion of mid RCA with L to R collaterals, 90% stenosis diffusely in prox branch of RCA followed by 70% stenosis in secondary curve & 80% stenosis in small marginal branch ([name])  . CARDIAC CATHETERIZATION  05/28/2008   normal L main, RCA with 100% prox lesion w/distal filling from LAD collaterals, LAD with 20% prox tubular lesion/ 40% mid LAD lesion/previous stent patent ([name])  . CARDIAC CATHETERIZATION  09/15/2009   discrete 100% osital RCA lesion, 50% prox LAD lesion, non-obstructive disease in all coronaries ([name])  . CESAREAN SECTION    . COLONOSCOPY    . COLONOSCOPY W/ POLYPECTOMY    . CORONARY ANGIOPLASTY WITH STENT PLACEMENT  10/31/2007   PCI of distal Cfx with 2.25x57mm Taxus Adam DES, 60% narrowing of mid LAD ([name])  . CORONARY ANGIOPLASTY WITH STENT PLACEMENT  06/11/2011   PCI of prox-mid LAD with 3x71mm DES Resolute ([name])  . DILATION AND CURETTAGE, DIAGNOSTIC / THERAPEUTIC    . DILITATION & CURRETTAGE/HYSTROSCOPY WITH HYDROTHERMAL ABLATION N/A 04/03/2016   Procedure: DILATATION & CURETTAGE/HYSTEROSCOPY WITH HYDROTHERMAL ABLATION;  Surgeon: Juanda Crumble  Simona Huh, MD;  Location: [facility];  Service: Gynecology;  Laterality: N/A;  . NM MYOCAR PERF WALL MOTION  08/2009   persantine myoview - normal perfusion in all regions, perfusion defect in anterior region (breast attenuation), EF 52%, low risk scan    OB History    No data available       Home Medications    Prior to Admission medications   Medication Sig Start Date End Date Taking? Authorizing Provider  albuterol (PROVENTIL HFA;VENTOLIN HFA) 108 (90 BASE) MCG/ACT inhaler Inhale 2 puffs  into the lungs every 6 (six) hours as needed for wheezing or shortness of breath.    [provider]  amLODipine (NORVASC) 10 MG tablet Take 10 mg by mouth daily.    [provider]  aspirin EC 325 MG tablet Take 325 mg by mouth daily.    [provider]  atorvastatin (LIPITOR) 40 MG tablet Take 1 tablet (40 mg total) by mouth daily. 01/04/14   [name], MD  clopidogrel (PLAVIX) 75 MG tablet Take 1 tablet (75 mg total) by mouth daily with breakfast. 01/04/14   [name], MD  fluticasone-salmeterol (ADVAIR [facility]) [phone] MCG/ACT inhaler Inhale 2 puffs into the lungs 2 (two) times daily. 01/04/14   [name], MD  lisinopril-hydrochlorothiazide (PRINZIDE,ZESTORETIC) 20-12.5 MG per tablet Take 2 tablets by mouth daily.    [provider]  Nebivolol HCl (BYSTOLIC PO) Take 1 tablet by mouth daily.    [provider]  nitroGLYCERIN (NITROSTAT) 0.4 MG SL tablet Place 1 tablet (0.4 mg total) under the tongue every 5 (five) minutes as needed for chest pain. 06/05/15   [name], MD  Oxycodone HCl 10 MG TABS Take 1 tablet (10 mg total) by mouth every 6 (six) hours as needed. Patient not taking: Reported on 12/20/2017 10/25/17   [name], MD  sertraline (ZOLOFT) 50 MG tablet Take 50 mg by mouth daily.    [provider]  terconazole (TERAZOL 3) 0.8 % vaginal cream Place 1 applicator vaginally at bedtime. Patient not taking: Reported on 12/20/2017 07/08/17   [name], MD  zolpidem (AMBIEN) 10 MG tablet Take 10 mg by mouth at bedtime as needed for sleep.    [provider]    Family History Family History  Problem Relation Age of Onset  . Leukemia Mother   . Clotting disorder Father        blood clot  . Hypertension Sister   . Diabetes Sister   . Stroke Sister 48  . Bleeding Disorder Son        ITP "free bleeding disorder"    Social History Social History   Tobacco Use  . Smoking status: Current Every  Day Smoker    Packs/day: 0.50    Years: 0.00    Pack years: 0.00    Types: Cigarettes  . Smokeless tobacco: Never Used  Substance Use Topics  . Alcohol use: No    Alcohol/week: 0.0 oz    Comment: seldom  . Drug use: No    Comment: Occassionally, last time April 2017     Allergies   Other; Amoxicillin; Dilaudid [hydromorphone hcl]; and Latex   Review of Systems Review of Systems  Constitutional: Positive for chills. Negative for fever.  HENT: Positive for congestion, postnasal drip, rhinorrhea, sinus pressure and sore throat.   Respiratory: Positive for cough. Negative for chest tightness and shortness of breath.   Cardiovascular: Negative for chest pain, palpitations  and leg swelling.  Gastrointestinal: Negative for abdominal pain, diarrhea, nausea and vomiting.  Genitourinary: Negative for dysuria, flank pain, pelvic pain, vaginal bleeding, vaginal discharge and vaginal pain.  Musculoskeletal: Negative for arthralgias, myalgias, neck pain and neck stiffness.  Skin: Negative for rash.  Neurological: Negative for dizziness, weakness and headaches.  All other systems reviewed and are negative.    Physical Exam Updated Vital Signs BP (!) 160/111 (BP Location: Left Arm)   Pulse (!) 119   Temp 98.8 F (37.1 C) (Oral)   Resp 20   LMP  (LMP Unknown) Comment: verified BEFORE imaging  SpO2 94%   Physical Exam  Constitutional: She appears well-developed and well-nourished. No distress.  HENT:  Head: Normocephalic.  Eyes: Conjunctivae are normal.  Neck: Neck supple.  Cardiovascular: Normal rate, regular rhythm and normal heart sounds.  Pulmonary/Chest: Effort normal and breath sounds normal. No respiratory distress. She has no wheezes. She has no rales.  Abdominal: Soft. Bowel sounds are normal. She exhibits no distension. There is no tenderness. There is no rebound.  Musculoskeletal: She exhibits no edema.  Neurological: She is alert.  Skin: Skin is warm and dry.    Psychiatric: She has a normal mood and affect. Her behavior is normal.  Nursing note and vitals reviewed.    ED Treatments / Results  Labs (all labs ordered are listed, but only abnormal results are displayed) Labs Reviewed  INFLUENZA PANEL BY PCR (TYPE A & B)    EKG  EKG Interpretation None       Radiology Dg Chest 2 View  Result Date: 12/26/2017 CLINICAL DATA:  Body aches and headache. Upper respiratory infection symptoms. Recently finished antibiotics for pneumonia. EXAM: CHEST  2 VIEW COMPARISON:  12/10/2017 FINDINGS: Heart size and pulmonary vascularity are normal. Lungs appear clear and expanded. Area of infiltration seen previously has resolved in the interval. No blunting of costophrenic angles. No pneumothorax. Mediastinal contours appear intact. IMPRESSION: No active cardiopulmonary disease. Electronically Signed   By: Lucienne Capers M.D.   On: 12/26/2017 06:40    Procedures Procedures (including critical care time)  Medications Ordered in ED Medications  acetaminophen (TYLENOL) tablet 650 mg (not administered)  benzonatate (TESSALON) capsule 200 mg (not administered)  ketorolac (TORADOL) 30 MG/ML injection 30 mg (not administered)     Initial Impression / Assessment and Plan / ED Course  I have reviewed the triage vital signs and the nursing notes.  Pertinent labs & imaging results that were available during my care of the patient were reviewed by me and considered in my medical decision making (see chart for details).     Patient in emergency department with flulike symptoms.  She is complaining of severe headache.  Will treat with Toradol and Tylenol.  She is requesting to be checked for flu, swab was obtained and are negative.  Chest x-ray is negative as well.  Most likely viral upper respiratory tract infection.  Vital signs are all within normal.  She is nontoxic-appearing.  Discussed symptomatic treatment.  She will go home and follow-up with family  doctor if not improving.  Return precautions discussed.  Vitals:   12/26/17 0530 12/26/17 0600 12/26/17 0700 12/26/17 0730  BP: (!) 153/102 (!) 174/102 (!) 144/93 (!) 133/98  Pulse:   92 95  Resp:      Temp:      TempSrc:      SpO2:   100% 100%     Final Clinical Impressions(s) / ED Diagnoses   Final  diagnoses:  Viral URI with cough    ED Discharge Orders    None       Jeannett Senior, [place] 12/26/17 [place], April, MD 12/28/17 2305

## 2017-12-26 NOTE — ED Triage Notes (Addendum)
Pt transported from home by EMS, Pt c/o URI s/s, no fever, nasal drainage, facial pressure.  Pt recently finished antbx for ?PNA +body aches, HA

## 2017-12-26 NOTE — Discharge Instructions (Signed)
Continue to rest. Drink plenty of fluids. Tylenol for headache or body aches. Take zyrtec for congestion. Try intranasal saline or flushes. Follow up with family doctor as needed. Return if worsening.   Today your chest xray is negative.  Your flu swab is negative.

## 2017-12-29 ENCOUNTER — Ambulatory Visit (HOSPITAL_COMMUNITY): Admission: RE | Admit: 2017-12-29 | Payer: Medicaid Other | Source: Ambulatory Visit

## 2017-12-29 ENCOUNTER — Ambulatory Visit (HOSPITAL_COMMUNITY): Payer: Medicaid Other

## 2018-01-04 ENCOUNTER — Telehealth: Payer: Self-pay

## 2018-01-04 NOTE — Telephone Encounter (Signed)
Returned call, and pt stated that she is still having really bad abdominal pain and request that provider give her a call, gave provider message.

## 2018-01-14 ENCOUNTER — Encounter: Payer: Self-pay | Admitting: *Deleted

## 2018-01-14 ENCOUNTER — Encounter: Payer: Self-pay | Admitting: Cardiovascular Disease

## 2018-01-14 ENCOUNTER — Ambulatory Visit: Payer: Medicaid Other | Admitting: Cardiovascular Disease

## 2018-01-14 DIAGNOSIS — I2510 Atherosclerotic heart disease of native coronary artery without angina pectoris: Secondary | ICD-10-CM

## 2018-01-14 DIAGNOSIS — E7800 Pure hypercholesterolemia, unspecified: Secondary | ICD-10-CM

## 2018-01-14 DIAGNOSIS — I10 Essential (primary) hypertension: Secondary | ICD-10-CM

## 2018-01-14 MED ORDER — CLOPIDOGREL BISULFATE 75 MG PO TABS: 75.0000 mg | ORAL_TABLET | Freq: Every day | ORAL | 2 refills | Status: DC

## 2018-01-14 MED ORDER — NITROGLYCERIN 0.4 MG SL SUBL: 0.4000 mg | SUBLINGUAL_TABLET | SUBLINGUAL | 6 refills | Status: DC | PRN

## 2018-01-14 NOTE — Assessment & Plan Note (Signed)
History of CAD status post multiple heart attacks dating back to 2004. She had a stent placed in her circumflex by Dr. Nadyne Coombes  December 2008 (Taxus 2.5 mm x 24 mg). She had a Medtronic placed proximal LAD by Dr. Claiborne Billings 06/11/11. Because of recurrent chest pain and dyspnea and Myoview performed 04/04/14 was normal as was a 2-D echo. She currently denies chest pain or shortness of breath.

## 2018-01-14 NOTE — Patient Instructions (Signed)
Medication Instructions: Your physician recommends that you continue on your current medications as directed. Please refer to the Current Medication list given to you today.  If you need a refill on your cardiac medications before your next appointment, please call your pharmacy.   Follow-Up: Your physician wants you to follow-up in 12 months with Dr. Berry. You will receive a reminder letter in the mail two months in advance. If you don't receive a letter, please call our office at [phone] to schedule this follow-up appointment.   Thank you for choosing [facility]!!      

## 2018-01-14 NOTE — Assessment & Plan Note (Signed)
History of essential hypertension blood pressure measures 161/103. She is in a lot of pain because of cervical disc disease. She is on amlodipine, Bystolic ,  lisinopril and hydrochlorothiazide. Continue current meds at current dosing.

## 2018-01-14 NOTE — Progress Notes (Signed)
01/14/2018 Tracey Manning   1961/11/17  [id number]  Primary Physician Nolene Ebbs, MD Primary Cardiologist: [name] MD [name], [place]  HPI:  Tracey Manning is a 57 y.o.  moderately overweight single [place] American female mother of 2 children, grandmother and 2 grandchildren who currently is out of work on [employer] and disability. I last saw her in the office  02/12/17.She was referred by Dr. Shawna Orleans for cardiovascular evaluation because of chest pain. Her cardiac risk factor profile is positive for 20-pack-years of tobacco abuse currently smoking one pack per day, treated type of pressure and high cholesterol. She has had multiple heart attacks in the past dating back to 2004. She had a stent placed in her circumflex by Dr. Einar Gip December 2008 (Taxus 2.5 x 24 mm). She had a Medtronic stent placed in her proximal LAD by Dr. Claiborne Billings 06/11/11. Over the last 6 months she has developed recurrent chest pain as well as dyspnea on exertion. Since I saw her on 04/04/14 I obtained a Myoview stress test which was normal and a 2-D echo which revealed normal LV function. Since I saw her a year ago, she's done well clinically. She gets occasional chest pain which has not changed in frequency or severity. Her blood pressure is somewhat elevated due to back and abdominal pain however.  Her major issue is of pain in her leg and right arm thought to be related to his cervical radiculopathy. She denies chest pain or shortness of breath.   No outpatient medications have been marked as taking for the 01/14/18 encounter (Office Visit) with [name], MD.     Allergies  Allergen Reactions  . Other Anaphylaxis and Hives    Fresh strawberries (throat swelled)  . Amoxicillin Nausea And Vomiting  . Dilaudid [Hydromorphone Hcl] Nausea And Vomiting  . Latex Swelling    No reaction with bandaids    Social History   Socioeconomic History  . Marital status: Single    Spouse  name: Not on file  . Number of children: 2  . Years of education: 50  . Highest education level: Not on file  Social Needs  . Financial resource strain: Not on file  . Food insecurity - worry: Not on file  . Food insecurity - inability: Not on file  . Transportation needs - medical: Not on file  . Transportation needs - non-medical: Not on file  Occupational History  . Not on file  Tobacco Use  . Smoking status: Current Every Day Smoker    Packs/day: 0.50    Years: 0.00    Pack years: 0.00    Types: Cigarettes  . Smokeless tobacco: Never Used  Substance and Sexual Activity  . Alcohol use: No    Alcohol/week: 0.0 oz    Comment: seldom  . Drug use: No    Comment: Occassionally, last time April 2017  . Sexual activity: Yes    Partners: Male    Birth control/protection: Post-menopausal  Other Topics Concern  . Not on file  Social History Narrative  . Not on file     Review of Systems: General: negative for chills, fever, night sweats or weight changes.  Cardiovascular: negative for chest pain, dyspnea on exertion, edema, orthopnea, palpitations, paroxysmal nocturnal dyspnea or shortness of breath Dermatological: negative for rash Respiratory: negative for cough or wheezing Urologic: negative for hematuria Abdominal: negative for nausea, vomiting, diarrhea, bright red blood per rectum, melena, or hematemesis Neurologic: negative for visual  changes, syncope, or dizziness All other systems reviewed and are otherwise negative except as noted above.    Blood pressure (!) 161/103, pulse 82, height 5\' 1"  (1.549 m), weight 212 lb (96.2 kg).  General appearance: alert and no distress Neck: no adenopathy, no carotid bruit, no JVD, supple, symmetrical, trachea midline and thyroid not enlarged, symmetric, no tenderness/mass/nodules Lungs: clear to auscultation bilaterally Heart: regular rate and rhythm, S1, S2 normal, no murmur, click, rub or gallop Extremities: extremities normal,  atraumatic, no cyanosis or edema Pulses: 2+ and symmetric Skin: Skin color, texture, turgor normal. No rashes or lesions Neurologic: Alert and oriented X 3, normal strength and tone. Normal symmetric reflexes. Normal coordination and gait  EKG not performed today  ASSESSMENT AND PLAN:   Essential hypertension History of essential hypertension blood pressure measures 161/103. She is in a lot of pain because of cervical disc disease. She is on amlodipine, Bystolic ,  lisinopril and hydrochlorothiazide. Continue current meds at current dosing.  Hyperlipidemia History of hyperlipidemia on statin therapy followed by her PCP  Coronary artery disease History of CAD status post multiple heart attacks dating back to 2004. She had a stent placed in her circumflex by Dr. Nadyne Coombes  December 2008 (Taxus 2.5 mm x 24 mg). She had a Medtronic placed proximal LAD by Dr. Claiborne Billings 06/11/11. Because of recurrent chest pain and dyspnea and Myoview performed 04/04/14 was normal as was a 2-D echo. She currently denies chest pain or shortness of breath.      [name] MD FACP,FACC,FAHA, [facility] 01/14/2018 10:15 AM

## 2018-01-14 NOTE — Assessment & Plan Note (Signed)
History of hyperlipidemia on statin therapy followed by her PCP. 

## 2018-01-17 ENCOUNTER — Other Ambulatory Visit: Payer: Self-pay | Admitting: Neurosurgery

## 2018-01-19 ENCOUNTER — Ambulatory Visit: Payer: Medicaid Other | Admitting: Cardiovascular Disease

## 2018-01-26 ENCOUNTER — Ambulatory Visit (HOSPITAL_COMMUNITY)
Admission: RE | Admit: 2018-01-26 | Discharge: 2018-01-26 | Disposition: A | Discharge status: Home / Self Care | Payer: Medicaid Other | Source: Ambulatory Visit | Attending: Neurosurgery | Admitting: Neurosurgery

## 2018-01-26 DIAGNOSIS — M48061 Spinal stenosis, lumbar region without neurogenic claudication: Secondary | ICD-10-CM | POA: Insufficient documentation

## 2018-01-26 DIAGNOSIS — M5136 Other intervertebral disc degeneration, lumbar region: Secondary | ICD-10-CM | POA: Insufficient documentation

## 2018-01-26 DIAGNOSIS — M5412 Radiculopathy, cervical region: Secondary | ICD-10-CM | POA: Diagnosis not present

## 2018-01-26 DIAGNOSIS — M4316 Spondylolisthesis, lumbar region: Secondary | ICD-10-CM | POA: Diagnosis not present

## 2018-01-26 MED ORDER — DIAZEPAM 5 MG PO TABS
10.0000 mg | ORAL_TABLET | Freq: Once | ORAL | Status: AC
  Administered 2018-01-26: 10 mg via ORAL

## 2018-01-26 MED ORDER — OXYCODONE HCL 5 MG PO TABS
5.0000 mg | ORAL_TABLET | ORAL | Status: DC | PRN
  Administered 2018-01-26: 10 mg via ORAL

## 2018-01-26 MED ORDER — IOPAMIDOL (ISOVUE-M 300) INJECTION 61%
INTRAMUSCULAR | Status: AC
  Filled 2018-01-26: qty 15

## 2018-01-26 MED ORDER — DIAZEPAM 5 MG PO TABS
ORAL_TABLET | ORAL | Status: AC
  Administered 2018-01-26: 10 mg via ORAL
  Filled 2018-01-26: qty 2

## 2018-01-26 MED ORDER — IOPAMIDOL (ISOVUE-M 300) INJECTION 61%
15.0000 mL | Freq: Once | INTRAMUSCULAR | Status: AC | PRN
  Administered 2018-01-26: 15 mL via INTRATHECAL

## 2018-01-26 MED ORDER — OXYCODONE HCL 5 MG PO TABS
ORAL_TABLET | ORAL | Status: AC
  Administered 2018-01-26: 10 mg via ORAL
  Filled 2018-01-26: qty 1

## 2018-01-26 MED ORDER — OXYCODONE HCL 5 MG PO TABS: 5.0000 mg | ORAL_TABLET | ORAL | Status: DC | PRN

## 2018-01-26 MED ORDER — ONDANSETRON HCL 4 MG/2ML IJ SOLN: 4.0000 mg | Freq: Four times a day (QID) | INTRAMUSCULAR | Status: DC | PRN

## 2018-01-26 MED ORDER — LIDOCAINE HCL (PF) 1 % IJ SOLN
INTRAMUSCULAR | Status: AC
  Filled 2018-01-26: qty 5

## 2018-01-26 MED ORDER — LIDOCAINE HCL (PF) 1 % IJ SOLN
5.0000 mL | Freq: Once | INTRAMUSCULAR | Status: AC
  Administered 2018-01-26: 5 mL via INTRADERMAL

## 2018-01-26 MED ORDER — OXYCODONE HCL 5 MG PO TABS
ORAL_TABLET | ORAL | Status: AC
  Filled 2018-01-26: qty 1

## 2018-01-26 NOTE — Discharge Instructions (Signed)
Myelogram, Care After °These instructions give you information about caring for yourself after your procedure. Your doctor may also give you more specific instructions. Call your doctor if you have any problems or questions after your procedure. °Follow these instructions at home: °· Drink enough fluid to keep your pee (urine) clear or pale yellow. °· Rest as told by your doctor. °· Lie flat with your head slightly raised (elevated). °· Do not bend, lift, or do any hard activities for 24-48 hours or as told by your doctor. °· Take over-the-counter and prescription medicines only as told by your doctor. °· Take care of and remove your bandage (dressing) as told by your doctor. °· Bathe or shower as told by your doctor. °Contact a health care provider if: °· You have a fever. °· You have a headache that lasts longer than 24 hours. °· You feel sick to your stomach (nauseous). °· You throw up (vomit). °· Your neck is stiff. °· Your legs feel numb. °· You cannot pee. °· You cannot poop (have a bowel movement). °· You have a rash. °· You are itchy or sneezing. °Get help right away if: °· You have new symptoms or your symptoms get worse. °· You have a seizure. °· You have trouble breathing. °This information is not intended to replace advice given to you by your health care provider. Make sure you discuss any questions you have with your health care provider. °Document Released: 08/18/2008 Document Revised: 07/09/2016 Document Reviewed: 08/22/2015 °Elsevier Interactive Patient Education © 2018 Elsevier Inc. ° °

## 2018-01-26 NOTE — Op Note (Signed)
01/26/2018 Cervical and Lumbar Myelogram  PATIENT:  Tracey Manning is a 57 y.o. female  PRE-OPERATIVE DIAGNOSIS:  Cervicalgia, lumbar spondylolisthesis  POST-OPERATIVE DIAGNOSIS:  same  PROCEDURE:  Lumbar Myelogram  SURGEON:  Mindel Friscia  ANESTHESIA:   local LOCAL MEDICATIONS USED:  LIDOCAINE  and Amount: 8 ml Procedure Note: Eda Magnussen is a 57 y.o. female Was taken to the fluoroscopy suite and  positioned prone on the fluoroscopy table. her back was prepared and draped in a sterile manner. I infiltrated 8 cc into the lumbar region. I then introduced a spinal needle into the thecal sac at the L4/5 interlaminar space. I infiltrated 15cc of Isovue 300 into the thecal sac. Fluoroscopy showed the needle and contrast in the thecal sac. Morgann Woodburn did not tolerate the procedure well. she cried, complained of leg, and thigh cramps. She complained that she needed pain medication, which we did provide. She did scream consistently during the xrays Will be taken to CT for evaluation.     PATIENT DISPOSITION:  Short Stay

## 2018-01-27 ENCOUNTER — Encounter: Payer: Self-pay | Admitting: Obstetrics

## 2018-01-27 ENCOUNTER — Other Ambulatory Visit (HOSPITAL_COMMUNITY)
Admission: RE | Admit: 2018-01-27 | Discharge: 2018-01-27 | Disposition: A | Discharge status: Home / Self Care | Payer: Medicaid Other | Source: Ambulatory Visit | Attending: Obstetrics | Admitting: Obstetrics

## 2018-01-27 ENCOUNTER — Ambulatory Visit: Payer: Medicaid Other | Admitting: Obstetrics

## 2018-01-27 VITALS — BP 154/108 | HR 70 | Temp 98.6°F | Ht 61.0 in | Wt 209.4 lb

## 2018-01-27 DIAGNOSIS — N898 Other specified noninflammatory disorders of vagina: Secondary | ICD-10-CM | POA: Insufficient documentation

## 2018-01-27 DIAGNOSIS — B373 Candidiasis of vulva and vagina: Secondary | ICD-10-CM | POA: Insufficient documentation

## 2018-01-27 DIAGNOSIS — R102 Pelvic and perineal pain: Secondary | ICD-10-CM

## 2018-01-27 DIAGNOSIS — L0232 Furuncle of buttock: Secondary | ICD-10-CM

## 2018-01-27 MED ORDER — OXYCODONE HCL 10 MG PO TABS: 10.0000 mg | ORAL_TABLET | Freq: Four times a day (QID) | ORAL | 0 refills | Status: DC | PRN

## 2018-01-27 MED ORDER — CLINDAMYCIN HCL 300 MG PO CAPS: 300.0000 mg | ORAL_CAPSULE | Freq: Three times a day (TID) | ORAL | 0 refills | Status: DC

## 2018-01-27 NOTE — Progress Notes (Signed)
Patient ID: Tracey Manning, female   DOB: January 13, 1961, 57 y.o.   MRN: [id number]  Chief Complaint  Patient presents with  . Abdominal Pain    lower abdominal pain    HPI Tracey Manning is a 57 y.o. female.  Lower abdominal pain.  Denies vaginal bleeding, discharge or irritation.  Has a painful boil on buttocks that has ruptured. HPI  Past Medical History:  Diagnosis Date  . Anxiety   . Arthritis   . Asthma   . Chest pain   . Collagen vascular disease ([place])   . Coronary artery disease   . Depression   . Dyslipidemia   . GERD (gastroesophageal reflux disease)    occ  . Hypertension   . Myocardial infarct ([place]) 4818,5631  . Sciatica   . Sleep apnea    no CPAP ordered but using oxygen at bedtime  . Tobacco abuse     Past Surgical History:  Procedure Laterality Date  . CARDIAC CATHETERIZATION  10/09/2003   [facility], [place]) - LAD with 30% prox narrowing, 50% stenosis in mid-portion of PLA; RCA with 40% narrowing proximally ([name], [name])  . CARDIAC CATHETERIZATION  10/10/2007   70% stenosis in first septal perforator branch of LAD, 60-70% narrowing in mid LAD, 20% narrowing in mid AV groove Cfx with 80% diffuse narrowing in small distal marginal, total occlusion of mid RCA with L to R collaterals, 90% stenosis diffusely in prox branch of RCA followed by 70% stenosis in secondary curve & 80% stenosis in small marginal branch ([name])  . CARDIAC CATHETERIZATION  05/28/2008   normal L main, RCA with 100% prox lesion w/distal filling from LAD collaterals, LAD with 20% prox tubular lesion/ 40% mid LAD lesion/previous stent patent ([name])  . CARDIAC CATHETERIZATION  09/15/2009   discrete 100% osital RCA lesion, 50% prox LAD lesion, non-obstructive disease in all coronaries ([name])  . CESAREAN SECTION    . COLONOSCOPY    . COLONOSCOPY W/ POLYPECTOMY    . CORONARY ANGIOPLASTY WITH STENT PLACEMENT  10/31/2007   PCI of distal Cfx with  2.25x54mm Taxus Adam DES, 60% narrowing of mid LAD ([name])  . CORONARY ANGIOPLASTY WITH STENT PLACEMENT  06/11/2011   PCI of prox-mid LAD with 3x62mm DES Resolute ([name])  . DILATION AND CURETTAGE, DIAGNOSTIC / THERAPEUTIC    . DILITATION & CURRETTAGE/HYSTROSCOPY WITH HYDROTHERMAL ABLATION N/A 04/03/2016   Procedure: DILATATION & CURETTAGE/HYSTEROSCOPY WITH HYDROTHERMAL ABLATION;  Surgeon: [name], MD;  Location: [facility];  Service: Gynecology;  Laterality: N/A;  . NM MYOCAR PERF WALL MOTION  08/2009   persantine myoview - normal perfusion in all regions, perfusion defect in anterior region (breast attenuation), EF 52%, low risk scan    Family History  Problem Relation Age of Onset  . Leukemia Mother   . Clotting disorder Father        blood clot  . Hypertension Sister   . Diabetes Sister   . Stroke Sister 68  . Bleeding Disorder Son        ITP "free bleeding disorder"    Social History Social History   Tobacco Use  . Smoking status: Current Every Day Smoker    Packs/day: 0.50    Years: 0.00    Pack years: 0.00    Types: Cigarettes  . Smokeless tobacco: Never Used  Substance Use Topics  . Alcohol use: No    Alcohol/week: 0.0 oz  Comment: seldom  . Drug use: No    Comment: Occassionally, last time April 2017    Allergies  Allergen Reactions  . Other Anaphylaxis and Hives    Fresh strawberries (throat swelled)  . Amoxicillin Nausea And Vomiting  . Dilaudid [Hydromorphone Hcl] Nausea And Vomiting  . Latex Swelling    No reaction with bandaids    Current Outpatient Medications  Medication Sig Dispense Refill  . albuterol (PROVENTIL HFA;VENTOLIN HFA) 108 (90 BASE) MCG/ACT inhaler Inhale 2 puffs into the lungs every 6 (six) hours as needed for wheezing or shortness of breath.    Marland Kitchen amLODipine (NORVASC) 10 MG tablet Take 10 mg by mouth daily.    Marland Kitchen aspirin EC 325 MG tablet Take 325 mg by mouth daily.    Marland Kitchen atorvastatin (LIPITOR) 40 MG tablet Take 1  tablet (40 mg total) by mouth daily. 30 tablet 2  . clindamycin (CLEOCIN) 300 MG capsule Take 1 capsule (300 mg total) by mouth 3 (three) times daily. 30 capsule 0  . clopidogrel (PLAVIX) 75 MG tablet Take 1 tablet (75 mg total) by mouth daily with breakfast. 30 tablet 2  . fluticasone-salmeterol (ADVAIR HFA) 115-21 MCG/ACT inhaler Inhale 2 puffs into the lungs 2 (two) times daily. (Patient taking differently: Inhale 2 puffs into the lungs 2 (two) times daily as needed. ) 1 Inhaler 12  . lisinopril-hydrochlorothiazide (PRINZIDE,ZESTORETIC) 20-12.5 MG per tablet Take 2 tablets by mouth daily.    . nebivolol (BYSTOLIC) 10 MG tablet Take 1 tablet by mouth daily.    . nitroGLYCERIN (NITROSTAT) 0.4 MG SL tablet Place 1 tablet (0.4 mg total) under the tongue every 5 (five) minutes as needed for chest pain. 25 tablet 6  . Oxycodone HCl 10 MG TABS Take 1 tablet (10 mg total) by mouth every 6 (six) hours as needed. 20 tablet 0  . sertraline (ZOLOFT) 50 MG tablet Take 50 mg by mouth daily.    Marland Kitchen terconazole (TERAZOL 3) 0.8 % vaginal cream Place 1 applicator vaginally at bedtime. (Patient not taking: Reported on 12/20/2017) 20 g 2  . zolpidem (AMBIEN) 10 MG tablet Take 10 mg by mouth at bedtime as needed for sleep.     No current facility-administered medications for this visit.     Review of Systems Review of Systems Constitutional: negative for fatigue and weight loss Respiratory: negative for cough and wheezing Cardiovascular: negative for chest pain, fatigue and palpitations Gastrointestinal: negative for abdominal pain and change in bowel habits Genitourinary: positive for pelvic pain Integument/breast: negative for nipple discharge Musculoskeletal:negative for myalgias Neurological: negative for gait problems and tremors Behavioral/Psych: negative for abusive relationship, depression Endocrine: negative for temperature intolerance      Blood pressure (!) 154/108, pulse 70, temperature 98.6 F  (37 C), height 5\' 1"  (1.549 m), weight 209 lb 6.4 oz (95 kg).  Physical Exam Physical Exam General:   alert and no distress  Skin:   no rash or abnormalities  Lungs:   clear to auscultation bilaterally  Heart:   regular rate and rhythm, S1, S2 normal, no murmur, click, rub or gallop  Breasts:   not examined  Abdomen:  normal findings: no organomegaly, soft, non-tender and no hernia  Pelvis:  External genitalia: normal general appearance Urinary system: urethral meatus normal and bladder without fullness, nontender Vaginal: normal without tenderness, induration or masses Cervix: normal appearance Adnexa: normal bimanual exam Uterus: anteverted and non-tender, normal size    50% of 15 min visit spent on counseling and coordination  of care.   Data Reviewed Wet Prep  Assessment     1. Pelvic pain - chronic, intermittent.  May eventually need pain management if more recurrent.  Rx: - Oxycodone 10 mg po q 6 hours prn.  # 20  2. Vaginal discharge - wet prep done  3. Boil of buttock Rx: - clindamycin (CLEOCIN) 300 MG capsule; Take 1 capsule (300 mg total) by mouth 3 (three) times daily.  Dispense: 30 capsule; Refill: 0    Plan    Follow up prn  No orders of the defined types were placed in this encounter.  Meds ordered this encounter  Medications  . clindamycin (CLEOCIN) 300 MG capsule    Sig: Take 1 capsule (300 mg total) by mouth 3 (three) times daily.    Dispense:  30 capsule    Refill:  0  . Oxycodone HCl 10 MG TABS    Sig: Take 1 tablet (10 mg total) by mouth every 6 (six) hours as needed.    Dispense:  20 tablet    Refill:  0    [name] MD

## 2018-01-27 NOTE — Addendum Note (Signed)
Addended by: Baltazar Najjar A on: 01/27/2018 05:15 PM   Modules accepted: Orders

## 2018-01-27 NOTE — Progress Notes (Signed)
Presents for lower abdominal pain 10/10 x 10 days. Boil in groin/buttock 8/10 x 2 days.  Denies discharge, itching, burning, NV.

## 2018-01-27 NOTE — Addendum Note (Signed)
Addended by: Baltazar Najjar A on: 01/27/2018 03:23 PM   Modules accepted: Miquel Dunn

## 2018-01-31 LAB — CERVICOVAGINAL ANCILLARY ONLY
Bacterial vaginitis: NEGATIVE
Candida vaginitis: POSITIVE — AB
Chlamydia: NEGATIVE
Neisseria Gonorrhea: NEGATIVE
TRICH (WINDOWPATH): NEGATIVE

## 2018-02-01 ENCOUNTER — Other Ambulatory Visit: Payer: Self-pay | Admitting: Obstetrics

## 2018-02-01 DIAGNOSIS — B373 Candidiasis of vulva and vagina: Secondary | ICD-10-CM

## 2018-02-01 DIAGNOSIS — B3731 Acute candidiasis of vulva and vagina: Secondary | ICD-10-CM

## 2018-02-01 MED ORDER — FLUCONAZOLE 150 MG PO TABS: 150.0000 mg | ORAL_TABLET | Freq: Once | ORAL | 0 refills | Status: AC

## 2018-02-08 ENCOUNTER — Telehealth: Payer: Self-pay | Admitting: Cardiovascular Disease

## 2018-02-08 NOTE — Telephone Encounter (Signed)
   [facility] Pre-operative Risk Assessment    Request for surgical clearance:  1. What type of surgery is being performed? ESI - lumbar   2. When is this surgery scheduled? TBD   3. What type of clearance is required (medical clearance vs. Pharmacy clearance to hold med vs. Both)? Pharmacy   4. Are there any medications that need to be held prior to surgery and how long?asa & plavix - 7 days   5. Practice name and name of physician performing surgery? Dr. Marlaine Hind @ [facility]   6. What is your office phone and fax number? (p) [phone] (f) [phone]   7. Anesthesia type (None, local, MAC, general) ? Not specified/none   Tracey Manning M 02/08/2018, 1:07 PM  _________________________________________________________________   (provider comments below)

## 2018-02-09 NOTE — Telephone Encounter (Signed)
   Primary Cardiologist: Quay Burow, MD  Chart reviewed as part of pre-operative protocol coverage.  Amil Moseman was recently seen on 01/14/18 by Dr. Gwenlyn Found and was stable from a cardiac standpoint.  Pt has h/o CAD and is on ASA and Plavix and needing ESI- lumbar. I will check w/ Dr. Gwenlyn Found to see if ok to hold antiplatelets for 7 days.    Lyda Jester, PA-C 02/09/2018, 2:46 PM

## 2018-02-15 NOTE — Telephone Encounter (Signed)
Efaxed to [facility]

## 2018-02-15 NOTE — Telephone Encounter (Signed)
Okay to hold antiplatelet agents. 

## 2018-04-13 ENCOUNTER — Emergency Department (HOSPITAL_COMMUNITY)
Admission: EM | Admit: 2018-04-13 | Discharge: 2018-04-13 | Disposition: A | Discharge status: Home / Self Care | Payer: Medicaid Other | Attending: Emergency Medicine | Admitting: Emergency Medicine

## 2018-04-13 ENCOUNTER — Emergency Department (HOSPITAL_COMMUNITY): Payer: Medicaid Other

## 2018-04-13 ENCOUNTER — Encounter (HOSPITAL_COMMUNITY): Payer: Self-pay | Admitting: Emergency Medicine

## 2018-04-13 DIAGNOSIS — R1011 Right upper quadrant pain: Secondary | ICD-10-CM | POA: Insufficient documentation

## 2018-04-13 DIAGNOSIS — Z91040 Latex allergy status: Secondary | ICD-10-CM | POA: Diagnosis not present

## 2018-04-13 DIAGNOSIS — R1084 Generalized abdominal pain: Secondary | ICD-10-CM | POA: Insufficient documentation

## 2018-04-13 DIAGNOSIS — I10 Essential (primary) hypertension: Secondary | ICD-10-CM | POA: Diagnosis not present

## 2018-04-13 DIAGNOSIS — R10811 Right upper quadrant abdominal tenderness: Secondary | ICD-10-CM

## 2018-04-13 DIAGNOSIS — Z7982 Long term (current) use of aspirin: Secondary | ICD-10-CM | POA: Diagnosis not present

## 2018-04-13 DIAGNOSIS — J45909 Unspecified asthma, uncomplicated: Secondary | ICD-10-CM | POA: Insufficient documentation

## 2018-04-13 DIAGNOSIS — F17210 Nicotine dependence, cigarettes, uncomplicated: Secondary | ICD-10-CM | POA: Diagnosis not present

## 2018-04-13 DIAGNOSIS — Z7902 Long term (current) use of antithrombotics/antiplatelets: Secondary | ICD-10-CM | POA: Diagnosis not present

## 2018-04-13 DIAGNOSIS — Z79899 Other long term (current) drug therapy: Secondary | ICD-10-CM | POA: Insufficient documentation

## 2018-04-13 DIAGNOSIS — I2510 Atherosclerotic heart disease of native coronary artery without angina pectoris: Secondary | ICD-10-CM | POA: Diagnosis not present

## 2018-04-13 DIAGNOSIS — Z955 Presence of coronary angioplasty implant and graft: Secondary | ICD-10-CM | POA: Diagnosis not present

## 2018-04-13 LAB — CBC WITH DIFFERENTIAL/PLATELET
ABS IMMATURE GRANULOCYTES: 0 10*3/uL (ref 0.0–0.1)
Basophils Absolute: 0 10*3/uL (ref 0.0–0.1)
Basophils Relative: 1 %
Eosinophils Absolute: 0.1 10*3/uL (ref 0.0–0.7)
Eosinophils Relative: 2 %
HEMATOCRIT: 42.3 % (ref 36.0–46.0)
HEMOGLOBIN: 14.4 g/dL (ref 12.0–15.0)
IMMATURE GRANULOCYTES: 1 %
LYMPHS ABS: 1.3 10*3/uL (ref 0.7–4.0)
LYMPHS PCT: 24 %
MCH: 28.9 pg (ref 26.0–34.0)
MCHC: 34 g/dL (ref 30.0–36.0)
MCV: 84.8 fL (ref 78.0–100.0)
MONOS PCT: 6 %
Monocytes Absolute: 0.3 10*3/uL (ref 0.1–1.0)
NEUTROS ABS: 3.6 10*3/uL (ref 1.7–7.7)
NEUTROS PCT: 66 %
Platelets: 215 10*3/uL (ref 150–400)
RBC: 4.99 MIL/uL (ref 3.87–5.11)
RDW: 15.8 % — AB (ref 11.5–15.5)
WBC: 5.5 10*3/uL (ref 4.0–10.5)

## 2018-04-13 LAB — URINALYSIS, ROUTINE W REFLEX MICROSCOPIC
Bilirubin Urine: NEGATIVE
GLUCOSE, UA: NEGATIVE mg/dL
Ketones, ur: NEGATIVE mg/dL
LEUKOCYTES UA: NEGATIVE
Nitrite: NEGATIVE
Protein, ur: 30 mg/dL — AB
SPECIFIC GRAVITY, URINE: 1.019 (ref 1.005–1.030)
pH: 5 (ref 5.0–8.0)

## 2018-04-13 LAB — COMPREHENSIVE METABOLIC PANEL
ALBUMIN: 3.6 g/dL (ref 3.5–5.0)
ALT: 24 U/L (ref 14–54)
AST: 23 U/L (ref 15–41)
Alkaline Phosphatase: 58 U/L (ref 38–126)
Anion gap: 7 (ref 5–15)
BILIRUBIN TOTAL: 0.4 mg/dL (ref 0.3–1.2)
BUN: 10 mg/dL (ref 6–20)
CO2: 26 mmol/L (ref 22–32)
CREATININE: 1.04 mg/dL — AB (ref 0.44–1.00)
Calcium: 9.2 mg/dL (ref 8.9–10.3)
Chloride: 106 mmol/L (ref 101–111)
GFR calc Af Amer: >60 mL/min (ref >60)
GFR, EST NON AFRICAN AMERICAN: 59 mL/min — AB (ref >60)
GLUCOSE: 128 mg/dL — AB (ref 65–99)
POTASSIUM: 3.4 mmol/L — AB (ref 3.5–5.1)
Sodium: 139 mmol/L (ref 135–145)
TOTAL PROTEIN: 7 g/dL (ref 6.5–8.1)

## 2018-04-13 LAB — I-STAT CG4 LACTIC ACID, ED: LACTIC ACID, VENOUS: 3.11 mmol/L — AB (ref 0.5–1.9)

## 2018-04-13 LAB — LIPASE, BLOOD: LIPASE: 25 U/L (ref 11–51)

## 2018-04-13 MED ORDER — DIPHENHYDRAMINE HCL 50 MG/ML IJ SOLN
25.0000 mg | Freq: Once | INTRAMUSCULAR | Status: AC
  Administered 2018-04-13: 25 mg via INTRAVENOUS
  Filled 2018-04-13: qty 1

## 2018-04-13 MED ORDER — PANTOPRAZOLE SODIUM 20 MG PO TBEC: 20.0000 mg | DELAYED_RELEASE_TABLET | Freq: Every day | ORAL | 0 refills | Status: DC

## 2018-04-13 MED ORDER — IOHEXOL 300 MG/ML  SOLN: 100.0000 mL | Freq: Once | INTRAMUSCULAR | Status: DC | PRN

## 2018-04-13 MED ORDER — IOHEXOL 300 MG/ML  SOLN
100.0000 mL | Freq: Once | INTRAMUSCULAR | Status: AC | PRN
  Administered 2018-04-13: 100 mL via INTRAVENOUS

## 2018-04-13 MED ORDER — BARIUM SULFATE 2 % PO SUSP: 450.0000 mL | Freq: Once | ORAL | Status: DC

## 2018-04-13 NOTE — ED Triage Notes (Signed)
Pt arrives from home via EMS for c.o. LQ abdominal pain radiating into hip, hx of sciatica. Pt also reports bilateral breast tenderness. Pt states the pain is constant and having a twisting sensation. Some nausea.

## 2018-04-13 NOTE — ED Notes (Addendum)
Per MD; Pt ok to eat. Given [place] sandwich and Sprite.

## 2018-04-13 NOTE — ED Notes (Signed)
Pt to CT at 14:42.

## 2018-04-13 NOTE — ED Notes (Signed)
Pt ambulated to bathroom without assistance 

## 2018-04-13 NOTE — ED Provider Notes (Signed)
Patient placed in Quick Look pathway, seen and evaluated   Chief Complaint: Abdominal pain  HPI:   Tracey Manning is a 57 y.o. female, presenting to the ED with abdominal pain beginning yesterday. Pain is epigastric, constant, cramping, radiating to right upper quadrant and right lower quadrant, severe in intensity.  Accompanied by nausea.  Denies fever/chills, vomiting, diarrhea, hematochezia/melena, chest pain, shortness of breath, abnormal vaginal discharge, vaginal bleeding, urinary symptoms.  ROS: Abdominal pain (one)  Physical Exam:   Gen: No distress  Neuro: Awake and Alert  Skin: Warm    Focused Exam:   No diaphoresis.  No pallor.  Pulmonary: No increased work of breathing.  Speaks in full sentences without difficulty.  Lung sounds clear.  No tachypnea.  Cardiac: Normal rate and regular. Peripheral pulses intact.  Abdominal: Tenderness appears to be centered in the epigastric and right upper quadrant regions, however, she is tender throughout most of the abdomen.  No guarding.  No CVA tenderness.  MSK: No peripheral edema.   Initiation of care has begun. The patient has been counseled on the process, plan, and necessity for staying for the completion/evaluation, and the remainder of the medical screening examination   Layla Maw 04/13/18 1341    Carmin Muskrat, MD 04/13/18 [phone]

## 2018-04-13 NOTE — ED Notes (Signed)
Lab called reported I-stat lactic acid 3.11. Notified doctor no orders at this time.

## 2018-04-13 NOTE — ED Notes (Signed)
Pt endorses itching at IV site, PA notified, orders received.

## 2018-04-13 NOTE — Discharge Instructions (Signed)
Make sure that you are drinking plenty of fluids, and gradually advance her diet to regular foods over the next few days.  Start taking the medication for ulcer disease, tomorrow.  Follow-up with your primary care doctor in 2 weeks for a checkup.

## 2018-04-13 NOTE — ED Notes (Signed)
Pt was asked to provide urine specimen; states that she gave urine that was collected in triage.

## 2018-04-13 NOTE — ED Provider Notes (Signed)
[facility] EMERGENCY DEPARTMENT Provider Note   CSN: [id number] Arrival date & time: 04/13/18  1327     History   Chief Complaint Chief Complaint  Patient presents with  . Abdominal Pain  . Hip Pain    HPI Tracey Manning is a 57 y.o. female.  HPI   She presents for evaluation of abdominal cramping which started yesterday and has been persistent.  She denies fever, chills, cough, shortness of breath, back pain, dysuria or urinary frequency.  She has a history of pelvic pain treated with Percocet but is not currently taking that.  Her only prior abdominal surgery, was a cesarean section.  She is taking her usual medications.  There are no other known modifying factors.   Past Medical History:  Diagnosis Date  . Anxiety   . Arthritis   . Asthma   . Chest pain   . Collagen vascular disease ([place])   . Coronary artery disease   . Depression   . Dyslipidemia   . GERD (gastroesophageal reflux disease)    occ  . Hypertension   . Myocardial infarct ([place]) [id number]  . Sciatica   . Sleep apnea    no CPAP ordered but using oxygen at bedtime  . Tobacco abuse     Patient Active Problem List   Diagnosis Date Noted  . Abnormal uterine bleeding (AUB) 04/03/2016  . Chest pain syndrome 11/14/2015  . Dysfunctional uterine bleeding 11/14/2015  . Right sided abdominal pain 11/14/2015  . Right anterior shoulder pain 11/14/2015  . Pelvic pain in female 11/14/2015  . Bacterial vaginosis (recurrent) 11/14/2015  . Chest pain 11/14/2015  . Essential hypertension 04/04/2014  . Hyperlipidemia 04/04/2014  . Coronary artery disease 04/04/2014    Past Surgical History:  Procedure Laterality Date  . CARDIAC CATHETERIZATION  10/09/2003   [place], [place]) - LAD with 30% prox narrowing, 50% stenosis in mid-portion of PLA; RCA with 40% narrowing proximally ([name], [name])  . CARDIAC CATHETERIZATION  10/10/2007   70% stenosis in first septal  perforator branch of LAD, 60-70% narrowing in mid LAD, 20% narrowing in mid AV groove Cfx with 80% diffuse narrowing in small distal marginal, total occlusion of mid RCA with L to R collaterals, 90% stenosis diffusely in prox branch of RCA followed by 70% stenosis in secondary curve & 80% stenosis in small marginal branch ([name])  . CARDIAC CATHETERIZATION  05/28/2008   normal L main, RCA with 100% prox lesion w/distal filling from LAD collaterals, LAD with 20% prox tubular lesion/ 40% mid LAD lesion/previous stent patent ([name])  . CARDIAC CATHETERIZATION  09/15/2009   discrete 100% osital RCA lesion, 50% prox LAD lesion, non-obstructive disease in all coronaries ([name])  . CESAREAN SECTION    . COLONOSCOPY    . COLONOSCOPY W/ POLYPECTOMY    . CORONARY ANGIOPLASTY WITH STENT PLACEMENT  10/31/2007   PCI of distal Cfx with 2.25x80mm Taxus Adam DES, 60% narrowing of mid LAD ([name])  . CORONARY ANGIOPLASTY WITH STENT PLACEMENT  06/11/2011   PCI of prox-mid LAD with 3x67mm DES Resolute ([name])  . DILATION AND CURETTAGE, DIAGNOSTIC / THERAPEUTIC    . DILITATION & CURRETTAGE/HYSTROSCOPY WITH HYDROTHERMAL ABLATION N/A 04/03/2016   Procedure: DILATATION & CURETTAGE/HYSTEROSCOPY WITH HYDROTHERMAL ABLATION;  Surgeon: [name], MD;  Location: [facility];  Service: Gynecology;  Laterality: N/A;  . NM MYOCAR PERF WALL MOTION  08/2009   persantine myoview -  normal perfusion in all regions, perfusion defect in anterior region (breast attenuation), EF 52%, low risk scan     OB History   None      Home Medications    Prior to Admission medications   Medication Sig Start Date End Date Taking? Authorizing Provider  albuterol (PROVENTIL HFA;VENTOLIN HFA) 108 (90 BASE) MCG/ACT inhaler Inhale 2 puffs into the lungs every 6 (six) hours as needed for wheezing or shortness of breath.    [provider]  amLODipine (NORVASC) 10 MG tablet Take 10 mg by mouth daily.     [provider]  aspirin EC 325 MG tablet Take 325 mg by mouth daily.    [provider]  atorvastatin (LIPITOR) 40 MG tablet Take 1 tablet (40 mg total) by mouth daily. 01/04/14   [name], MD  clindamycin (CLEOCIN) 300 MG capsule Take 1 capsule (300 mg total) by mouth 3 (three) times daily. 01/27/18   [name], MD  clopidogrel (PLAVIX) 75 MG tablet Take 1 tablet (75 mg total) by mouth daily with breakfast. 01/14/18   [name], MD  fluticasone-salmeterol (ADVAIR HFA) 115-21 MCG/ACT inhaler Inhale 2 puffs into the lungs 2 (two) times daily. Patient taking differently: Inhale 2 puffs into the lungs 2 (two) times daily as needed.  01/04/14   [name], MD  lisinopril-hydrochlorothiazide (PRINZIDE,ZESTORETIC) 20-12.5 MG per tablet Take 2 tablets by mouth daily.    [provider]  nebivolol (BYSTOLIC) 10 MG tablet Take 1 tablet by mouth daily.    [provider]  nitroGLYCERIN (NITROSTAT) 0.4 MG SL tablet Place 1 tablet (0.4 mg total) under the tongue every 5 (five) minutes as needed for chest pain. 01/14/18   [name], MD  Oxycodone HCl 10 MG TABS Take 1 tablet (10 mg total) by mouth every 6 (six) hours as needed. 01/27/18   [name], MD  pantoprazole (PROTONIX) 20 MG tablet Take 1 tablet (20 mg total) by mouth daily. 04/13/18   Daleen Bo, MD  sertraline (ZOLOFT) 50 MG tablet Take 50 mg by mouth daily.    [provider]  terconazole (TERAZOL 3) 0.8 % vaginal cream Place 1 applicator vaginally at bedtime. Patient not taking: Reported on 12/20/2017 07/08/17   [name], MD  zolpidem (AMBIEN) 10 MG tablet Take 10 mg by mouth at bedtime as needed for sleep.    [provider]    Family History Family History  Problem Relation Age of Onset  . Leukemia Mother   . Clotting disorder Father        blood clot  . Hypertension Sister   . Diabetes Sister   . Stroke Sister 70  . Bleeding Disorder Son         ITP "free bleeding disorder"    Social History Social History   Tobacco Use  . Smoking status: Current Every Day Smoker    Packs/day: 0.50    Years: 0.00    Pack years: 0.00    Types: Cigarettes  . Smokeless tobacco: Never Used  Substance Use Topics  . Alcohol use: No    Alcohol/week: 0.0 oz    Comment: seldom  . Drug use: No    Types: Marijuana    Comment: Occassionally, last time April 2017     Allergies   Other; Amoxicillin; Dilaudid [hydromorphone hcl]; and Latex   Review of Systems Review of Systems  All other systems reviewed and are negative.  Physical Exam Updated Vital Signs BP (!) 160/91   Pulse 63   Temp (!) 97.5 F (36.4 C) (Oral)   Resp (!) 23   LMP  (LMP Unknown) Comment: verified BEFORE imaging  SpO2 99%   Physical Exam  Constitutional: She is oriented to person, place, and time. She appears well-developed.  Overweight  HENT:  Head: Normocephalic and atraumatic.  Eyes: Pupils are equal, round, and reactive to light. Conjunctivae and EOM are normal.  Neck: Normal range of motion and phonation normal. Neck supple.  Cardiovascular: Normal rate and regular rhythm.  Pulmonary/Chest: Effort normal and breath sounds normal. She exhibits no tenderness.  Abdominal: Soft. She exhibits no distension and no mass. There is tenderness (Diffuse, mild). There is no rebound and no guarding.  Musculoskeletal: Normal range of motion.  Neurological: She is alert and oriented to person, place, and time. She exhibits normal muscle tone.  Skin: Skin is warm and dry.  Psychiatric: She has a normal mood and affect. Her behavior is normal. Judgment and thought content normal.  Nursing note and vitals reviewed.    ED Treatments / Results  Labs (all labs ordered are listed, but only abnormal results are displayed) Labs Reviewed  CBC WITH DIFFERENTIAL/PLATELET - Abnormal; Notable for the following components:      Result Value   RDW 15.8 (*)    All other  components within normal limits  COMPREHENSIVE METABOLIC PANEL - Abnormal; Notable for the following components:   Potassium 3.4 (*)    Glucose, Bld 128 (*)    Creatinine, Ser 1.04 (*)    GFR calc non Af Amer 59 (*)    All other components within normal limits  URINALYSIS, ROUTINE W REFLEX MICROSCOPIC - Abnormal; Notable for the following components:   APPearance HAZY (*)    Hgb urine dipstick SMALL (*)    Protein, ur 30 (*)    Bacteria, UA RARE (*)    All other components within normal limits  I-STAT CG4 LACTIC ACID, ED - Abnormal; Notable for the following components:   Lactic Acid, Venous 3.11 (*)    All other components within normal limits  LIPASE, BLOOD  I-STAT CG4 LACTIC ACID, ED    EKG None  Radiology Ct Abdomen Pelvis W Contrast  Result Date: 04/13/2018 CLINICAL DATA:  Acute generalized pain more so in the lower quadrant radiating into the hips. EXAM: CT ABDOMEN AND PELVIS WITH CONTRAST TECHNIQUE: Multidetector CT imaging of the abdomen and pelvis was performed using the standard protocol following bolus administration of intravenous contrast. CONTRAST:  1104mL OMNIPAQUE IOHEXOL 300 MG/ML  SOLN COMPARISON:  11/14/2015 CT FINDINGS: Lower chest: Top-normal size heart. Coronary arteriosclerosis along the left circumflex. No active pulmonary disease. No pericardial effusion. Hepatobiliary: Hepatic steatosis without biliary dilatation. Unremarkable gallbladder. Pancreas: Normal Spleen: Normal with adjacent splenule. Adrenals/Urinary Tract: Stable right adrenal adenoma measuring 14 mm. Right-sided water attenuating cysts, the largest is interpolar measuring 2.2 cm. The remainder are too small to further characterize. No obstructive uropathy. The urinary bladder is decompressed. Stomach/Bowel: Physiologic distention of the stomach. Normal small bowel rotation without obstruction or inflammation. Normal appendix. Scattered colonic diverticulosis without acute diverticulitis.  Vascular/Lymphatic: Aortoiliac atherosclerosis with mild ectasia of the common iliac arteries to 15 mm in caliber on the right and 18 mm on the left. No abdominopelvic adenopathy. Reproductive: Small intramural lower uterine segment fibroid on the right measuring 1 cm. Fundal fibroid also noted measuring 3.1 cm, stable in appearance. No adnexal mass. Other: No free  air nor free fluid. Musculoskeletal: Degenerative disc disease L5-S1. No acute nor suspicious osseous lesions. Multilevel degenerative facet arthropathy of the lumbar spine most notably from L3 through S1. IMPRESSION: 1. Degenerative disc disease L5-S1 with facet arthropathy most notably L3 through S1. 2. Colonic diverticulosis without acute diverticulitis. No bowel obstruction or inflammation. 3. Right-sided renal cysts the largest approximately 2.2 cm. 4. Hepatic steatosis without biliary dilatation. 5. Fibroid uterus.  Stable right adrenal adenoma measuring to 14 mm. 6. Coronary arteriosclerosis with stable borderline cardiomegaly. Electronically Signed   By: Ashley Royalty M.D.   On: 04/13/2018 15:18    Procedures Procedures (including critical care time)  Medications Ordered in ED Medications  barium (READI-CAT 2) 2 % suspension 450 mL (has no administration in time range)  iohexol (OMNIPAQUE) 300 MG/ML solution 100 mL (has no administration in time range)  diphenhydrAMINE (BENADRYL) injection 25 mg (25 mg Intravenous Given 04/13/18 1429)  iohexol (OMNIPAQUE) 300 MG/ML solution 100 mL (100 mLs Intravenous Contrast Given 04/13/18 1447)     Initial Impression / Assessment and Plan / ED Course  I have reviewed the triage vital signs and the nursing notes.  Pertinent labs & imaging results that were available during my care of the patient were reviewed by me and considered in my medical decision making (see chart for details).  Clinical Course as of Apr 14 1739  Wed Apr 13, 2018  1545 Elevated  I-Stat CG4 Lactic Acid, ED(!!) [EW]  1545  Normal  CBC with Differential(!) [EW]  1545 Normal  Lipase, blood [EW]  1545 Normal except potassium low, glucose high, creatinine high.  Comprehensive metabolic panel(!) [EW]  3875 Chronic changes include diverticulosis, renal cyst,  adrenal adenoma, hepatic steatosis, degenerative spine abnormality, coronary disease, cardiomegaly and uterine fibroid. No acute abnormalities present.   CT ABDOMEN PELVIS W CONTRAST [EW]    Clinical Course User Index [EW] Daleen Bo, MD     Patient Vitals for the past 24 hrs:  BP Temp Temp src Pulse Resp SpO2  04/13/18 1430 (!) 160/91 - - 63 (!) 23 99 %  04/13/18 1415 (!) 167/97 - - 69 - 99 %  04/13/18 1410 (!) 179/117 - - 78 18 100 %  04/13/18 1330 (!) 187/113 (!) 97.5 F (36.4 C) Oral 72 20 100 %    5:36 PM Reevaluation with update and discussion. After initial assessment and treatment, an updated evaluation reveals she is more comfortable now has been able to eat and drink and has no abdominal pain.  She confided in me that she has been having anal sex with her new partner and wondered if that was causing any problems.  She denies pain with insertion of penis into her anus and rectum, so I reassured her that that was okay if she was comfortable with it.  Findings discussed and questions were answered. Daleen Bo   Medical Decision Making: Nonspecific abdominal pain with reassuring evaluation.  Sirs criteria negative.  Blood pressure elevated, improved spontaneously.  Nonspecific lactate elevation.  Differential diagnosis includes peptic ulcer disease, nonspecific intestinal cramping, viral process.  The patient is amenable to a trial of PPI for possible ulcer disease.  CRITICAL CARE-no Performed by: Daleen Bo  Nursing Notes Reviewed/ Care Coordinated Applicable Imaging Reviewed Interpretation of Laboratory Data incorporated into ED treatment  The patient appears reasonably screened and/or stabilized for discharge and I doubt any other  medical condition or other [facility] requiring further screening, evaluation, or treatment in the ED at this time prior  to discharge.  Plan: Home Medications-continue usual medications; Home Treatments-rest, fluids, gradually advance diet; return here if the recommended treatment, does not improve the symptoms; Recommended follow up-PCP follow-up PRN    Final Clinical Impressions(s) / ED Diagnoses   Final diagnoses:  RUQ abdominal tenderness  Generalized abdominal pain    ED Discharge Orders        Ordered    pantoprazole (PROTONIX) 20 MG tablet  Daily     04/13/18 1738       Daleen Bo, MD 04/13/18 [phone]

## 2018-04-15 ENCOUNTER — Telehealth: Payer: Self-pay

## 2018-04-15 NOTE — Telephone Encounter (Signed)
TC back to pt regarding message. Pt complain of Abdominal pain was seen in the ER this week. Pt has scheduled appt w/ Dr.Harper Next Wed. Pt states she was told she has an ulcer, fibroids and cyst and pain medication not helping.  Pt also c/o diarrhea. Pt advised to stay away from spicy greasy foods and to increase water intake at to F/U with PCP as well .  Pt advised to back to ER since  Sx's have become worse.

## 2018-04-20 ENCOUNTER — Ambulatory Visit (INDEPENDENT_AMBULATORY_CARE_PROVIDER_SITE_OTHER): Payer: Medicaid Other | Admitting: Obstetrics

## 2018-04-20 ENCOUNTER — Encounter: Payer: Self-pay | Admitting: Obstetrics

## 2018-04-20 VITALS — BP 186/118 | HR 75 | Ht 61.0 in | Wt 217.0 lb

## 2018-04-20 DIAGNOSIS — R1013 Epigastric pain: Secondary | ICD-10-CM | POA: Diagnosis not present

## 2018-04-20 DIAGNOSIS — R102 Pelvic and perineal pain: Secondary | ICD-10-CM

## 2018-04-20 MED ORDER — OXYCODONE HCL 10 MG PO TABS: 10.0000 mg | ORAL_TABLET | Freq: Four times a day (QID) | ORAL | 0 refills | Status: DC | PRN

## 2018-04-20 MED ORDER — DICYCLOMINE HCL 20 MG PO TABS: 20.0000 mg | ORAL_TABLET | Freq: Three times a day (TID) | ORAL | 2 refills | Status: DC

## 2018-04-20 MED ORDER — KETOROLAC TROMETHAMINE 60 MG/2ML IM SOLN
60.0000 mg | Freq: Once | INTRAMUSCULAR | Status: AC
  Administered 2018-04-20: 60 mg via INTRAMUSCULAR

## 2018-04-20 NOTE — Progress Notes (Signed)
Patient ID: Tracey Manning, female   DOB: 1961-11-10, 57 y.o.   MRN: [id number]  Chief Complaint  Patient presents with  . Abdominal Pain    HPI Tracey Manning is a 57 y.o. female.  Complains of upper abdominal pain and hard stools.  She states that pain can radiate down to lower abdomen. HPI  Past Medical History:  Diagnosis Date  . Anxiety   . Arthritis   . Asthma   . Chest pain   . Collagen vascular disease ([place])   . Coronary artery disease   . Depression   . Dyslipidemia   . GERD (gastroesophageal reflux disease)    occ  . Hypertension   . Myocardial infarct ([place]) [id number]  . Sciatica   . Sleep apnea    no CPAP ordered but using oxygen at bedtime  . Tobacco abuse     Past Surgical History:  Procedure Laterality Date  . CARDIAC CATHETERIZATION  10/09/2003   [facility] [place], [place]) - LAD with 30% prox narrowing, 50% stenosis in mid-portion of PLA; RCA with 40% narrowing proximally ([name], [name])  . CARDIAC CATHETERIZATION  10/10/2007   70% stenosis in first septal perforator branch of LAD, 60-70% narrowing in mid LAD, 20% narrowing in mid AV groove Cfx with 80% diffuse narrowing in small distal marginal, total occlusion of mid RCA with L to R collaterals, 90% stenosis diffusely in prox branch of RCA followed by 70% stenosis in secondary curve & 80% stenosis in small marginal branch ([name])  . CARDIAC CATHETERIZATION  05/28/2008   normal L main, RCA with 100% prox lesion w/distal filling from LAD collaterals, LAD with 20% prox tubular lesion/ 40% mid LAD lesion/previous stent patent ([name])  . CARDIAC CATHETERIZATION  09/15/2009   discrete 100% osital RCA lesion, 50% prox LAD lesion, non-obstructive disease in all coronaries ([name])  . CESAREAN SECTION    . COLONOSCOPY    . COLONOSCOPY W/ POLYPECTOMY    . CORONARY ANGIOPLASTY WITH STENT PLACEMENT  10/31/2007   PCI of distal Cfx with 2.25x82mm Taxus Adam DES, 60% narrowing of mid  LAD ([name])  . CORONARY ANGIOPLASTY WITH STENT PLACEMENT  06/11/2011   PCI of prox-mid LAD with 3x43mm DES Resolute ([name])  . DILATION AND CURETTAGE, DIAGNOSTIC / THERAPEUTIC    . DILITATION & CURRETTAGE/HYSTROSCOPY WITH HYDROTHERMAL ABLATION N/A 04/03/2016   Procedure: DILATATION & CURETTAGE/HYSTEROSCOPY WITH HYDROTHERMAL ABLATION;  Surgeon: [name], MD;  Location: [facility];  Service: Gynecology;  Laterality: N/A;  . NM MYOCAR PERF WALL MOTION  08/2009   persantine myoview - normal perfusion in all regions, perfusion defect in anterior region (breast attenuation), EF 52%, low risk scan    Family History  Problem Relation Age of Onset  . Leukemia Mother   . Clotting disorder Father        blood clot  . Hypertension Sister   . Diabetes Sister   . Stroke Sister 98  . Bleeding Disorder Son        ITP "free bleeding disorder"    Social History Social History   Tobacco Use  . Smoking status: Current Every Day Smoker    Packs/day: 0.50    Years: 0.00    Pack years: 0.00    Types: Cigarettes  . Smokeless tobacco: Never Used  Substance Use Topics  . Alcohol use: No    Alcohol/week: 0.0 oz    Comment: seldom  . Drug use:  Yes    Types: Marijuana    Comment: Occassionally, last time May 2019    Allergies  Allergen Reactions  . Other Anaphylaxis and Hives    Fresh strawberries (throat swelled)  . Amoxicillin Nausea And Vomiting  . Dilaudid [Hydromorphone Hcl] Nausea And Vomiting  . Latex Swelling    No reaction with bandaids    Current Outpatient Medications  Medication Sig Dispense Refill  . albuterol (PROVENTIL HFA;VENTOLIN HFA) 108 (90 BASE) MCG/ACT inhaler Inhale 2 puffs into the lungs every 6 (six) hours as needed for wheezing or shortness of breath.    Marland Kitchen amLODipine (NORVASC) 10 MG tablet Take 10 mg by mouth daily.    Marland Kitchen aspirin EC 325 MG tablet Take 325 mg by mouth daily.    Marland Kitchen atorvastatin (LIPITOR) 40 MG tablet Take 1 tablet (40 mg total) by mouth  daily. 30 tablet 2  . clopidogrel (PLAVIX) 75 MG tablet Take 1 tablet (75 mg total) by mouth daily with breakfast. 30 tablet 2  . fluticasone-salmeterol (ADVAIR HFA) 115-21 MCG/ACT inhaler Inhale 2 puffs into the lungs 2 (two) times daily. (Patient taking differently: Inhale 2 puffs into the lungs 2 (two) times daily as needed. ) 1 Inhaler 12  . lisinopril-hydrochlorothiazide (PRINZIDE,ZESTORETIC) 20-12.5 MG per tablet Take 2 tablets by mouth daily.    . nebivolol (BYSTOLIC) 10 MG tablet Take 1 tablet by mouth daily.    . nitroGLYCERIN (NITROSTAT) 0.4 MG SL tablet Place 1 tablet (0.4 mg total) under the tongue every 5 (five) minutes as needed for chest pain. 25 tablet 6  . pantoprazole (PROTONIX) 20 MG tablet Take 1 tablet (20 mg total) by mouth daily. 30 tablet 0  . sertraline (ZOLOFT) 50 MG tablet Take 50 mg by mouth daily.    Marland Kitchen zolpidem (AMBIEN) 10 MG tablet Take 10 mg by mouth at bedtime as needed for sleep.    Marland Kitchen dicyclomine (BENTYL) 20 MG tablet Take 1 tablet (20 mg total) by mouth 3 (three) times daily before meals. 90 tablet 2  . Oxycodone HCl 10 MG TABS Take 1 tablet (10 mg total) by mouth every 6 (six) hours as needed. 20 tablet 0   No current facility-administered medications for this visit.     Review of Systems Review of Systems Constitutional: negative for fatigue and weight loss Respiratory: negative for cough and wheezing Cardiovascular: negative for chest pain, fatigue and palpitations Gastrointestinal: POSITIVE for abdominal pain and change in bowel habits - constipation Genitourinary:POSITIVE for pelvic pain Integument/breast: negative for nipple discharge Musculoskeletal:POSITIVE for myalgias - backache Neurological: negative for gait problems and tremors Behavioral/Psych: negative for abusive relationship, depression Endocrine: negative for temperature intolerance      Blood pressure (!) 186/118, pulse 75, height 5\' 1"  (1.549 m), weight 217 lb (98.4 kg).  Physical  Exam Physical Exam           General:  Alert and in moderate pain Abdomen:  positive epigastric tenderness.  No masses or organomegaly  Pelvis:  External genitalia: normal general appearance Urinary system: urethral meatus normal and bladder without fullness, nontender Vaginal: normal without tenderness, induration or masses Cervix: normal appearance Adnexa: normal bimanual exam Uterus: anteverted and non-tender, normal size    50% of 20 min visit spent on counseling and coordination of care.   Data Reviewed CT scan of abdomen and pelvis: CT ABDOMEN PELVIS W CONTRAST (Accession [id number]) (Order [id number])  Imaging  Date: 04/13/2018 Department: [facility] EMERGENCY DEPARTMENT Released By/Authorizing: Lorayne Bender, PA-C (  auto-released)  Exam Information   Status Exam Begun  Exam Ended   Final [99] 04/13/2018 2:46 PM 04/13/2018 3:10 PM  PACS Images   Show images for CT ABDOMEN PELVIS W CONTRAST  Study Result   CLINICAL DATA:  Acute generalized pain more so in the lower quadrant radiating into the hips.  EXAM: CT ABDOMEN AND PELVIS WITH CONTRAST  TECHNIQUE: Multidetector CT imaging of the abdomen and pelvis was performed using the standard protocol following bolus administration of intravenous contrast.  CONTRAST:  14mL OMNIPAQUE IOHEXOL 300 MG/ML  SOLN  COMPARISON:  11/14/2015 CT  FINDINGS: Lower chest: Top-normal size heart. Coronary arteriosclerosis along the left circumflex. No active pulmonary disease. No pericardial effusion.  Hepatobiliary: Hepatic steatosis without biliary dilatation. Unremarkable gallbladder.  Pancreas: Normal  Spleen: Normal with adjacent splenule.  Adrenals/Urinary Tract: Stable right adrenal adenoma measuring 14 mm. Right-sided water attenuating cysts, the largest is interpolar measuring 2.2 cm. The remainder are too small to further characterize. No obstructive uropathy. The urinary bladder  is decompressed.  Stomach/Bowel: Physiologic distention of the stomach. Normal small bowel rotation without obstruction or inflammation. Normal appendix. Scattered colonic diverticulosis without acute diverticulitis.  Vascular/Lymphatic: Aortoiliac atherosclerosis with mild ectasia of the common iliac arteries to 15 mm in caliber on the right and 18 mm on the left. No abdominopelvic adenopathy.  Reproductive: Small intramural lower uterine segment fibroid on the right measuring 1 cm. Fundal fibroid also noted measuring 3.1 cm, stable in appearance. No adnexal mass.  Other: No free air nor free fluid.  Musculoskeletal: Degenerative disc disease L5-S1. No acute nor suspicious osseous lesions. Multilevel degenerative facet arthropathy of the lumbar spine most notably from L3 through S1.  IMPRESSION: 1. Degenerative disc disease L5-S1 with facet arthropathy most notably L3 through S1. 2. Colonic diverticulosis without acute diverticulitis. No bowel obstruction or inflammation. 3. Right-sided renal cysts the largest approximately 2.2 cm. 4. Hepatic steatosis without biliary dilatation. 5. Fibroid uterus.  Stable right adrenal adenoma measuring to 14 mm. 6. Coronary arteriosclerosis with stable borderline cardiomegaly.   Electronically Signed   By: Ashley Royalty M.D.   On: 04/13/2018 15:18    Assessment     1. Epigastric pain Rx: - Ambulatory referral to Gastroenterology - dicyclomine (BENTYL) 20 MG tablet; Take 1 tablet (20 mg total) by mouth 3 (three) times daily before meals.  Dispense: 90 tablet; Refill: 2  2. Pelvic pain Rx: - Oxycodone HCl 10 MG TABS; Take 1 tablet (10 mg total) by mouth every 6 (six) hours as needed.  Dispense: 20 tablet; Refill: 0    Plan    Follow up prn   Orders Placed This Encounter  Procedures  . Ambulatory referral to Gastroenterology    Referral Priority:   Routine    Referral Type:   Consultation    Referral Reason:    Specialty Services Required    Number of Visits Requested:   1   Meds ordered this encounter  Medications  . Oxycodone HCl 10 MG TABS    Sig: Take 1 tablet (10 mg total) by mouth every 6 (six) hours as needed.    Dispense:  20 tablet    Refill:  0  . dicyclomine (BENTYL) 20 MG tablet    Sig: Take 1 tablet (20 mg total) by mouth 3 (three) times daily before meals.    Dispense:  90 tablet    Refill:  2  . ketorolac (TORADOL) injection 60 mg    [name] MD 04-20-2018

## 2018-04-27 ENCOUNTER — Telehealth: Payer: Self-pay

## 2018-04-27 ENCOUNTER — Encounter: Payer: Self-pay | Admitting: Gastroenterology

## 2018-04-27 ENCOUNTER — Ambulatory Visit: Payer: Medicaid Other | Admitting: Gastroenterology

## 2018-04-27 VITALS — BP 180/116 | HR 88 | Ht 61.0 in | Wt 215.0 lb

## 2018-04-27 DIAGNOSIS — R1013 Epigastric pain: Secondary | ICD-10-CM

## 2018-04-27 DIAGNOSIS — Z86010 Personal history of colonic polyps: Secondary | ICD-10-CM

## 2018-04-27 DIAGNOSIS — R1030 Lower abdominal pain, unspecified: Secondary | ICD-10-CM

## 2018-04-27 DIAGNOSIS — M549 Dorsalgia, unspecified: Secondary | ICD-10-CM

## 2018-04-27 MED ORDER — PANTOPRAZOLE SODIUM 20 MG PO TBEC: 40.0000 mg | DELAYED_RELEASE_TABLET | Freq: Two times a day (BID) | ORAL | 0 refills | Status: DC

## 2018-04-27 MED ORDER — SUPREP BOWEL PREP KIT 17.5-3.13-1.6 GM/177ML PO SOLN: ORAL | 0 refills | Status: DC

## 2018-04-27 NOTE — Telephone Encounter (Signed)
Called and spoke to pt.  Relayed recommendation. She expressed understanding and said she would not take her Plavix starting on July 20th but will take aspirin. Dr. Loni Muse will let her know when to resume.

## 2018-04-27 NOTE — Telephone Encounter (Signed)
[facility] Pre-operative Risk Assessment     Request for surgical clearance:     Endoscopy Procedure  What type of surgery is being performed?     Colonoscopy endoscopy  When is this surgery scheduled?     06-16-18  What type of clearance is required ?   Pharmacy  Are there any medications that need to be held prior to surgery and how long? Plavix 5 days  Practice name and name of physician performing surgery?      [facility]  What is your office phone and fax number?      Phone- [phone]  [phone]  Anesthesia type (None, local, MAC, general) ?       MAC

## 2018-04-27 NOTE — Telephone Encounter (Signed)
   Primary Cardiologist: Quay Burow, MD  Chart reviewed as part of pre-operative protocol coverage. Patient was contacted 04/27/2018 in reference to pre-operative risk assessment for pending surgery as outlined below.  Tracey Manning was last seen on 12/2017 by Dr. Gwenlyn Found. H/o prior MIs, tobacco, HTN, HLD; last PCI 2012. Normal nuc 2015. At time of clearance for [facility] 01/2018, Dr. Gwenlyn Found had cleared patient to hold Plavix.   Discussed with patient - she is walking regularly now that the weather is nicer, able to complete >4 METS and has no angina or dyspnea. Therefore, based on ACC/AHA guidelines, the patient would be at acceptable risk for the planned procedure without further cardiovascular testing. Per prior recommendation from Dr. Gwenlyn Found, [place] to hold Plavix for 5 days prior to colonoscopy/endoscopy. Patient says she was given permission to continue ASA.  I will route this recommendation to the requesting party via Epic fax function and remove from pre-op pool. Please call with questions.  As an aside, I did note patient is on ASA 325mg  in addition to her Plavix - will route this message to [name] for review. Dr. Gwenlyn Found, if you feel the patient should be on lower dose of aspirin, please forward your reply to your nurse to let them know. Otherwise I'll remove this message from APP pool.   Charlie Pitter, PA-C 04/27/2018, 4:58 PM

## 2018-04-27 NOTE — Progress Notes (Signed)
HPI :  57 year old female with a history of coronary artery disease status post stent placement remotely on Plavix, sleep apnea, tobacco use, referred here by Dr. Baltazar Najjar for abdominal pain.  She has had multiple heart attacks in the past dating back to 2004. She had a stent placed in her circumflex by Dr. Einar Gip December 2008 (Taxus 2.5 x 24 mm). She had a Medtronic stent placed in her proximal LAD by Dr. Claiborne Billings 06/11/11. She in on plavix and had an echo in 2015 showing EF of 60-65%.  She reports having abdominal pain for at least the past 2 years. She endorses pain in both her epigastric and lower abdomen area. She states the pain is there "all the time and never goes away", rated as 10 out of 10. She states she is in 10 out of 10 pain today. She states the pain is worst in her epigastric area although feels a diffusely in her upper abdomen and lower abdomen. She does have some ongoing reflux symptoms which bother her, often after eating or drinking something. She denies any odynophagia. She denies any dysphagia. She states eating doesn't usually make the pain worse. She is not sure if anything she can tell of makes it specifically better or worse. She is tender to the touch. She denies any nausea or vomiting. She has roughly 3 bowel movements a day which are regular. Sometimes she can have loose stools. She denies any blood in her stool that she is aware of. She is taking Plavix 75 mg every day in conjunction with 325 mg of aspirin. She denies any other NSAID use. She was not placed on a PPI until recently after an ER visit for this issue. She was placed on Protonix 20 mg once daily which she just recently started taking. She is also given a prescription for Bentyl which she has not yet started. She was evaluated by her gynecologist to refer her to our office. She had a CT scan a few weeks ago for this issue which do not show any obvious pathology to cause her severe pain. She also had normal labs at  that time. She currently uses tobacco. She also endorses chronic back pain and had degenerative changes on CT scan. She denies any known family history of colon cancer. She thinks she had a colonoscopy 6 years ago and had multiple polyps removed, this was done in [place] for which we have no report available. She's never had a prior upper endoscopy. She has been on gabapentin and Lyrica for pain in the past which has not provided any benefit.   CT scan abdomen / pelvis 04/13/2018 - DJD L5-S1, diverticulosis, renal cyst, hepatic steatosis, CAD with borderline cardiomegaly. Normal gallbladder.  CT abdomen / pelvis 11/14/2015 - no acute findings      Past Medical History:  Diagnosis Date  . Anxiety   . Arthritis   . Asthma   . Chest pain   . Collagen vascular disease ([place])   . Coronary artery disease   . Depression   . Dyslipidemia   . GERD (gastroesophageal reflux disease)    occ  . Hypertension   . Myocardial infarct ([place]) [id number]  . Sciatica   . Sleep apnea    no CPAP ordered but using oxygen at bedtime  . Tobacco abuse      Past Surgical History:  Procedure Laterality Date  . CARDIAC CATHETERIZATION  10/09/2003   [facility] [place], [place]) - LAD with 30%  prox narrowing, 50% stenosis in mid-portion of PLA; RCA with 40% narrowing proximally ([name], [name])  . CARDIAC CATHETERIZATION  10/10/2007   70% stenosis in first septal perforator branch of LAD, 60-70% narrowing in mid LAD, 20% narrowing in mid AV groove Cfx with 80% diffuse narrowing in small distal marginal, total occlusion of mid RCA with L to R collaterals, 90% stenosis diffusely in prox branch of RCA followed by 70% stenosis in secondary curve & 80% stenosis in small marginal branch ([name])  . CARDIAC CATHETERIZATION  05/28/2008   normal L main, RCA with 100% prox lesion w/distal filling from LAD collaterals, LAD with 20% prox tubular lesion/ 40% mid LAD lesion/previous stent patent ([name])  . CARDIAC CATHETERIZATION  09/15/2009   discrete 100% osital RCA lesion, 50% prox LAD lesion, non-obstructive disease in all coronaries ([name])  . CESAREAN SECTION    . COLONOSCOPY    . COLONOSCOPY W/ POLYPECTOMY    . CORONARY ANGIOPLASTY WITH STENT PLACEMENT  10/31/2007   PCI of distal Cfx with 2.25x55mm Taxus Adam DES, 60% narrowing of mid LAD ([name])  . CORONARY ANGIOPLASTY WITH STENT PLACEMENT  06/11/2011   PCI of prox-mid LAD with 3x80mm DES Resolute ([name])  . DILATION AND CURETTAGE, DIAGNOSTIC / THERAPEUTIC    . DILITATION & CURRETTAGE/HYSTROSCOPY WITH HYDROTHERMAL ABLATION N/A 04/03/2016   Procedure: DILATATION & CURETTAGE/HYSTEROSCOPY WITH HYDROTHERMAL ABLATION;  Surgeon: [name], MD;  Location: [facility];  Service: Gynecology;  Laterality: N/A;  . NM MYOCAR PERF WALL MOTION  08/2009   persantine myoview - normal perfusion in all regions, perfusion defect in anterior region (breast attenuation), EF 52%, low risk scan   Family History  Problem Relation Age of Onset  . Leukemia Mother   . Clotting disorder Father        blood clot  . Hypertension Sister   . Diabetes Sister   . Stroke Sister 21  . Bleeding Disorder Son        ITP "free bleeding disorder"   Social History   Tobacco Use  . Smoking status: Current Every Day Smoker    Packs/day: 0.50    Years: 0.00    Pack years: 0.00    Types: Cigarettes  . Smokeless tobacco: Never Used  Substance Use Topics  . Alcohol use: No    Alcohol/week: 0.0 oz    Comment: seldom  . Drug use: Yes    Types: Marijuana    Comment: Occassionally, last time May 2019   Current Outpatient Medications  Medication Sig Dispense Refill  . albuterol (PROVENTIL HFA;VENTOLIN HFA) 108 (90 BASE) MCG/ACT inhaler Inhale 2 puffs into the lungs every 6 (six) hours as needed for wheezing or shortness of breath.    Marland Kitchen amLODipine (NORVASC) 10 MG tablet Take 10 mg by mouth daily.    Marland Kitchen aspirin EC 325 MG tablet Take 325  mg by mouth daily.    Marland Kitchen atorvastatin (LIPITOR) 40 MG tablet Take 1 tablet (40 mg total) by mouth daily. 30 tablet 2  . clopidogrel (PLAVIX) 75 MG tablet Take 1 tablet (75 mg total) by mouth daily with breakfast. 30 tablet 2  . dicyclomine (BENTYL) 20 MG tablet Take 1 tablet (20 mg total) by mouth 3 (three) times daily before meals. 90 tablet 2  . fluticasone-salmeterol (ADVAIR HFA) 115-21 MCG/ACT inhaler Inhale 2 puffs into the lungs 2 (two) times daily. (Patient taking differently: Inhale 2 puffs into the lungs  2 (two) times daily as needed. ) 1 Inhaler 12  . lisinopril-hydrochlorothiazide (PRINZIDE,ZESTORETIC) 20-12.5 MG per tablet Take 2 tablets by mouth daily.    . nebivolol (BYSTOLIC) 10 MG tablet Take 1 tablet by mouth daily.    . nitroGLYCERIN (NITROSTAT) 0.4 MG SL tablet Place 1 tablet (0.4 mg total) under the tongue every 5 (five) minutes as needed for chest pain. 25 tablet 6  . Oxycodone HCl 10 MG TABS Take 1 tablet (10 mg total) by mouth every 6 (six) hours as needed. 20 tablet 0  . pantoprazole (PROTONIX) 20 MG tablet Take 1 tablet (20 mg total) by mouth daily. 30 tablet 0  . sertraline (ZOLOFT) 50 MG tablet Take 50 mg by mouth daily.    Marland Kitchen zolpidem (AMBIEN) 10 MG tablet Take 10 mg by mouth at bedtime as needed for sleep.     No current facility-administered medications for this visit.    Allergies  Allergen Reactions  . Other Anaphylaxis and Hives    Fresh strawberries (throat swelled)  . Amoxicillin Nausea And Vomiting  . Dilaudid [Hydromorphone Hcl] Nausea And Vomiting  . Latex Swelling    No reaction with bandaids     Review of Systems: All systems reviewed and negative except where noted in HPI.    Ct Abdomen Pelvis W Contrast  Result Date: 04/13/2018 CLINICAL DATA:  Acute generalized pain more so in the lower quadrant radiating into the hips. EXAM: CT ABDOMEN AND PELVIS WITH CONTRAST TECHNIQUE: Multidetector CT imaging of the abdomen and pelvis was performed using the  standard protocol following bolus administration of intravenous contrast. CONTRAST:  19mL OMNIPAQUE IOHEXOL 300 MG/ML  SOLN COMPARISON:  11/14/2015 CT FINDINGS: Lower chest: Top-normal size heart. Coronary arteriosclerosis along the left circumflex. No active pulmonary disease. No pericardial effusion. Hepatobiliary: Hepatic steatosis without biliary dilatation. Unremarkable gallbladder. Pancreas: Normal Spleen: Normal with adjacent splenule. Adrenals/Urinary Tract: Stable right adrenal adenoma measuring 14 mm. Right-sided water attenuating cysts, the largest is interpolar measuring 2.2 cm. The remainder are too small to further characterize. No obstructive uropathy. The urinary bladder is decompressed. Stomach/Bowel: Physiologic distention of the stomach. Normal small bowel rotation without obstruction or inflammation. Normal appendix. Scattered colonic diverticulosis without acute diverticulitis. Vascular/Lymphatic: Aortoiliac atherosclerosis with mild ectasia of the common iliac arteries to 15 mm in caliber on the right and 18 mm on the left. No abdominopelvic adenopathy. Reproductive: Small intramural lower uterine segment fibroid on the right measuring 1 cm. Fundal fibroid also noted measuring 3.1 cm, stable in appearance. No adnexal mass. Other: No free air nor free fluid. Musculoskeletal: Degenerative disc disease L5-S1. No acute nor suspicious osseous lesions. Multilevel degenerative facet arthropathy of the lumbar spine most notably from L3 through S1. IMPRESSION: 1. Degenerative disc disease L5-S1 with facet arthropathy most notably L3 through S1. 2. Colonic diverticulosis without acute diverticulitis. No bowel obstruction or inflammation. 3. Right-sided renal cysts the largest approximately 2.2 cm. 4. Hepatic steatosis without biliary dilatation. 5. Fibroid uterus.  Stable right adrenal adenoma measuring to 14 mm. 6. Coronary arteriosclerosis with stable borderline cardiomegaly. Electronically Signed    By: Ashley Royalty M.D.   On: 04/13/2018 15:18   Lab Results  Component Value Date   WBC 5.5 04/13/2018   HGB 14.4 04/13/2018   HCT 42.3 04/13/2018   MCV 84.8 04/13/2018   PLT 215 04/13/2018    Lab Results  Component Value Date   ALT 24 04/13/2018   AST 23 04/13/2018   ALKPHOS 58 04/13/2018  BILITOT 0.4 04/13/2018    Lab Results  Component Value Date   CREATININE 1.04 (H) 04/13/2018   BUN 10 04/13/2018   NA 139 04/13/2018   K 3.4 (L) 04/13/2018   CL 106 04/13/2018   CO2 26 04/13/2018    Lab Results  Component Value Date   LIPASE 25 04/13/2018       Physical Exam: Ht 5\' 1"  (1.549 m)   Wt 215 lb (97.5 kg)   LMP  (LMP Unknown) Comment: verified BEFORE imaging  BMI 40.62 kg/m  Constitutional: Pleasant,well-developed, female in no acute distress. HEENT: Normocephalic and atraumatic. Conjunctivae are normal. No scleral icterus. Neck supple.  Cardiovascular: Normal rate, regular rhythm.  Pulmonary/chest: Effort normal and breath sounds normal. No wheezing, rales or rhonchi. Abdominal: Soft, nondistended, diffusely tender in epigastric and lower mid abdomen with positive [place].  There are no masses palpable. No hepatomegaly. Extremities: no edema Lymphadenopathy: No cervical adenopathy noted. Neurological: Alert and oriented to person place and time. Skin: Skin is warm and dry. No rashes noted. Psychiatric: Normal mood and affect. Behavior is normal.   ASSESSMENT AND PLAN: 57 year old female with a history of CAD and low back pain, referred to our office for further evaluation of abdominal pain as outlined above:  Epigastric pain / lower abdominal pain / back pain - given the nature of her pain being present all the time, severity, physical exam findings and lack of other pathology on labs and CT imaging to date, I suspect she may likely have abdominal wall / neuropathic pain driving this picture, possibly related to her back pain as well. That being said she is on  aspirin and Plavix and at risk for peptic ulcer disease which could cause her upper abdominal discomfort. I offered her an upper endoscopy to ensure no evidence of this or H. Pylori. Following discussion of risks and benefits of endoscopy she wanted to proceed with this. Her pain is difficult to pinpoint and while I think atypical to represent biliary colic, will screen her gallbladder with an ultrasound to ensure no gallstones as she has risk factors for this. In the interim we will increase her Protonix to 40 mg twice daily, she can also try Bentyl as previously recommended to her to see if this helps. Given her history of reported colon polyps, we have no way of getting those reports as she does not know where it was done, I offered her a colonoscopy for surveillance purposes and ensure her lower tract is okay in light of her lower abdominal pain. She wanted to have this done at the same time as upper endoscopy. We will recheck to her prescribing provider to see if it is okay to stop her Plavix 5 days before the procedure, she can continue aspirin during this time. If imaging and endoscopy does not show any other pathology to account for her pain, we'll refer her to pain management. She is artery tried and failed gabapentin and Lyrica may consider adding a TCA moving forward if thought to be neuropathic. She agreed with the plan as outlined, all questions answered.  History of colon polyps - proceeding with colonoscopy as above  Dixie Cellar, MD [facility]  CC: [name], MD

## 2018-04-27 NOTE — Patient Instructions (Addendum)
If you are age 57 or older, your body mass index should be between 23-30. Your Body mass index is 40.62 kg/m. If this is out of the aforementioned range listed, please consider follow up with your Primary Care Provider.  If you are age 30 or younger, your body mass index should be between 19-25. Your Body mass index is 40.62 kg/m. If this is out of the aformentioned range listed, please consider follow up with your Primary Care Provider.   You have been scheduled for an endoscopy and colonoscopy. Please follow the written instructions given to you at your visit today. Please pick up your prep supplies at the pharmacy within the next 1-3 days. If you use inhalers (even only as needed), please bring them with you on the day of your procedure. Your physician has requested that you go to www.startemmi.com and enter the access code given to you at your visit today. This web site gives a general overview about your procedure. However, you should still follow specific instructions given to you by our office regarding your preparation for the procedure.  You have been scheduled for an abdominal ultrasound at [facility] (1st floor of hospital) on Friday, 04-29-2018 at 1:30pm. Please arrive 15 minutes prior to your appointment for registration. Make certain not to have anything to eat or drink 6 hours prior to your appointment. Should you need to reschedule your appointment, please contact radiology at [phone]. This test typically takes about 30 minutes to perform.  You will be contacted by our office prior to your procedure for directions on holding your Plavix.  You can take aspirin on those days. If you do not hear from our office 1 week prior to your scheduled procedure, please call [phone] to discuss.   Please increase your Protonix to 40 mg twice a day.   Take Bentyl 20 mg every 8 hours as needed.  Thank you for entrusting me with your care and for choosing [facility], Dr.  [place] Cellar

## 2018-04-29 ENCOUNTER — Ambulatory Visit (HOSPITAL_COMMUNITY)
Admission: RE | Admit: 2018-04-29 | Discharge: 2018-04-29 | Disposition: A | Discharge status: Home / Self Care | Payer: Medicaid Other | Source: Ambulatory Visit | Attending: Gastroenterology | Admitting: Gastroenterology

## 2018-04-29 DIAGNOSIS — M549 Dorsalgia, unspecified: Secondary | ICD-10-CM

## 2018-04-29 DIAGNOSIS — R1030 Lower abdominal pain, unspecified: Secondary | ICD-10-CM | POA: Insufficient documentation

## 2018-04-29 DIAGNOSIS — Z86010 Personal history of colonic polyps: Secondary | ICD-10-CM | POA: Insufficient documentation

## 2018-04-29 DIAGNOSIS — R1013 Epigastric pain: Secondary | ICD-10-CM | POA: Diagnosis not present

## 2018-04-29 DIAGNOSIS — K760 Fatty (change of) liver, not elsewhere classified: Secondary | ICD-10-CM | POA: Diagnosis not present

## 2018-05-04 ENCOUNTER — Emergency Department (HOSPITAL_COMMUNITY)
Admission: EM | Admit: 2018-05-04 | Discharge: 2018-05-04 | Disposition: A | Discharge status: Home / Self Care | Payer: Medicaid Other | Attending: Emergency Medicine | Admitting: Emergency Medicine

## 2018-05-04 ENCOUNTER — Other Ambulatory Visit: Payer: Self-pay

## 2018-05-04 ENCOUNTER — Encounter (HOSPITAL_COMMUNITY): Payer: Self-pay | Admitting: Emergency Medicine

## 2018-05-04 DIAGNOSIS — Z7982 Long term (current) use of aspirin: Secondary | ICD-10-CM | POA: Diagnosis not present

## 2018-05-04 DIAGNOSIS — I10 Essential (primary) hypertension: Secondary | ICD-10-CM

## 2018-05-04 DIAGNOSIS — R109 Unspecified abdominal pain: Secondary | ICD-10-CM | POA: Insufficient documentation

## 2018-05-04 DIAGNOSIS — R1013 Epigastric pain: Secondary | ICD-10-CM

## 2018-05-04 DIAGNOSIS — Z91040 Latex allergy status: Secondary | ICD-10-CM | POA: Diagnosis not present

## 2018-05-04 DIAGNOSIS — J45909 Unspecified asthma, uncomplicated: Secondary | ICD-10-CM | POA: Diagnosis not present

## 2018-05-04 DIAGNOSIS — F17210 Nicotine dependence, cigarettes, uncomplicated: Secondary | ICD-10-CM | POA: Diagnosis not present

## 2018-05-04 DIAGNOSIS — Z79899 Other long term (current) drug therapy: Secondary | ICD-10-CM | POA: Diagnosis not present

## 2018-05-04 DIAGNOSIS — G8929 Other chronic pain: Secondary | ICD-10-CM

## 2018-05-04 LAB — URINALYSIS, ROUTINE W REFLEX MICROSCOPIC
Bilirubin Urine: NEGATIVE
Glucose, UA: NEGATIVE mg/dL
Hgb urine dipstick: NEGATIVE
KETONES UR: NEGATIVE mg/dL
LEUKOCYTES UA: NEGATIVE
NITRITE: NEGATIVE
PH: 6 (ref 5.0–8.0)
PROTEIN: NEGATIVE mg/dL
Specific Gravity, Urine: 1.013 (ref 1.005–1.030)

## 2018-05-04 LAB — COMPREHENSIVE METABOLIC PANEL
ALT: 17 U/L (ref 14–54)
ANION GAP: 10 (ref 5–15)
AST: 17 U/L (ref 15–41)
Albumin: 3.7 g/dL (ref 3.5–5.0)
Alkaline Phosphatase: 62 U/L (ref 38–126)
BILIRUBIN TOTAL: 0.6 mg/dL (ref 0.3–1.2)
BUN: 11 mg/dL (ref 6–20)
CO2: 28 mmol/L (ref 22–32)
Calcium: 9.3 mg/dL (ref 8.9–10.3)
Chloride: 102 mmol/L (ref 101–111)
Creatinine, Ser: 1.09 mg/dL — ABNORMAL HIGH (ref 0.44–1.00)
GFR calc Af Amer: >60 mL/min (ref >60)
GFR, EST NON AFRICAN AMERICAN: 56 mL/min — AB (ref >60)
Glucose, Bld: 105 mg/dL — ABNORMAL HIGH (ref 65–99)
POTASSIUM: 3.3 mmol/L — AB (ref 3.5–5.1)
Sodium: 140 mmol/L (ref 135–145)
Total Protein: 6.9 g/dL (ref 6.5–8.1)

## 2018-05-04 LAB — CBC
HEMATOCRIT: 42.6 % (ref 36.0–46.0)
Hemoglobin: 14.5 g/dL (ref 12.0–15.0)
MCH: 28.7 pg (ref 26.0–34.0)
MCHC: 34 g/dL (ref 30.0–36.0)
MCV: 84.2 fL (ref 78.0–100.0)
Platelets: 253 10*3/uL (ref 150–400)
RBC: 5.06 MIL/uL (ref 3.87–5.11)
RDW: 15.6 % — AB (ref 11.5–15.5)
WBC: 7.2 10*3/uL (ref 4.0–10.5)

## 2018-05-04 LAB — I-STAT BETA HCG BLOOD, ED (MC, WL, AP ONLY): I-stat hCG, quantitative: <5 m[IU]/mL (ref <5)

## 2018-05-04 LAB — LIPASE, BLOOD: Lipase: 24 U/L (ref 11–51)

## 2018-05-04 MED ORDER — DICYCLOMINE HCL 20 MG PO TABS: 20.0000 mg | ORAL_TABLET | Freq: Three times a day (TID) | ORAL | 2 refills | Status: DC

## 2018-05-04 MED ORDER — KETOROLAC TROMETHAMINE 15 MG/ML IJ SOLN
15.0000 mg | Freq: Once | INTRAMUSCULAR | Status: AC
  Administered 2018-05-04: 15 mg via INTRAMUSCULAR
  Filled 2018-05-04: qty 1

## 2018-05-04 MED ORDER — OXYCODONE HCL 5 MG PO TABS
10.0000 mg | ORAL_TABLET | Freq: Once | ORAL | Status: AC
  Administered 2018-05-04: 10 mg via ORAL
  Filled 2018-05-04: qty 2

## 2018-05-04 MED ORDER — OXYCODONE-ACETAMINOPHEN 5-325 MG PO TABS
1.0000 | ORAL_TABLET | Freq: Once | ORAL | Status: AC
  Administered 2018-05-04: 1 via ORAL
  Filled 2018-05-04: qty 1

## 2018-05-04 NOTE — Discharge Instructions (Signed)
Please read and follow all provided instructions.  Your diagnoses today include:  1. Chronic abdominal pain   2. Epigastric pain     Tests performed today include:  Blood counts and electrolytes  Blood tests to check liver and kidney function  Blood tests to check pancreas function  Urine test to look for infection  Vital signs. See below for your results today.   Medications prescribed:   Bentyl - medication for intestinal cramps and spasms  Take any prescribed medications only as directed.  Home care instructions:   Follow any educational materials contained in this packet.  Follow-up instructions: Please follow-up with your primary care provider in the next 3 days for further evaluation of your symptoms.    Follow-up with GI for your endoscopies as planned.   Return instructions:  SEEK IMMEDIATE MEDICAL ATTENTION IF:  The pain does not go away or becomes severe   A temperature above 101F develops   Repeated vomiting occurs (multiple episodes)   The pain becomes localized to portions of the abdomen. The right side could possibly be appendicitis. In an adult, the left lower portion of the abdomen could be colitis or diverticulitis.   Blood is being passed in stools or vomit (bright red or black tarry stools)   You develop chest pain, difficulty breathing, dizziness or fainting, or become confused, poorly responsive, or inconsolable (young children)  If you have any other emergent concerns regarding your health  Additional Information: Abdominal (belly) pain can be caused by many things. Your caregiver performed an examination and possibly ordered blood/urine tests and imaging (CT scan, x-rays, ultrasound). Many cases can be observed and treated at home after initial evaluation in the emergency department. Even though you are being discharged home, abdominal pain can be unpredictable. Therefore, you need a repeated exam if your pain does not resolve, returns, or  worsens. Most patients with abdominal pain don't have to be admitted to the hospital or have surgery, but serious problems like appendicitis and gallbladder attacks can start out as nonspecific pain. Many abdominal conditions cannot be diagnosed in one visit, so follow-up evaluations are very important.  Your vital signs today were: BP (!) 187/113 (BP Location: Right Arm)    Pulse 70    Temp 98.2 F (36.8 C) (Oral)    Resp 18    Ht 5\' 1"  (1.549 m)    Wt 96.2 kg (212 lb)    LMP  (LMP Unknown) Comment: verified BEFORE imaging   SpO2 100%    BMI 40.06 kg/m  If your blood pressure (bp) was elevated above 135/85 this visit, please have this repeated by your doctor within one month. --------------

## 2018-05-04 NOTE — ED Notes (Signed)
Spoke to PA, [employer]

## 2018-05-04 NOTE — ED Triage Notes (Signed)
Pt presents to ED for assessment for continuing left abdominal pain after multiple CT scans, and [place].  Patient seen by OB/Gyn and GI without answers or relief.  Patient increased pantoprazole from 20mg  to 40mg .  Patient tearful in triage.  States no pain medicine at home to assist with pain.

## 2018-05-04 NOTE — ED Provider Notes (Signed)
[facility] EMERGENCY DEPARTMENT Provider Note   CSN: [id number] Arrival date & time: 05/04/18  1733     History   Chief Complaint Chief Complaint  Patient presents with  . Abdominal Pain    HPI Tracey Manning is a 57 y.o. female.  Patient with history of chronic abdominal pain presents with exacerbation of her chronic pain.  Patient has been evaluated for this and has had worsening pain over the past several weeks.  She has been seen in the emergency department, by her GYN doctor, by her GI doctor.  She has had extensive work-up without underlying etiology to this point.  Patient is currently on pantoprazole.  She has been prescribed pain medication by her GYN that she uses as needed when the pain is more severe.  Patient has had decreased oral intake because she is afraid that it is going to make her pain worse, although she denies any vomiting.  Pain is all over her abdomen but worse below her bellybutton.  She states that she saw her GYN and was referred back to GI.  She was told that she did not have any infection and that her GI did not think that her fibroids were related to her pain.  Patient has had previous MI which she describes as severe pain into her shoulders.  She had this in [place].  She states that this pain is nothing like that.  She denies any shortness of breath, fever, cough.  No urinary symptoms.     US performed 04/29/2018 by GI showed hepatic steatosis and no evidence of gallstones. Current GI plan (Dr. Havery Moros): schedule EGD/colonoscopy, increase Protonix to 5m qd, bentyl.   CT scan performed 04/13/2018 in ED showed DDD L5-S1, Diverticulosis without diverticulitis, R renal cysts, hepatic steatosis, fibroid uterus, coronary atherosclerosis.   [place] database: 04/20/18 #20 oxycodone 161mtablet; 03/29/18 #10 Vicodin 5/32520mablet.  Past Medical History:  Diagnosis Date  . Anxiety   . Arthritis   . Asthma   . Chest pain   . Collagen  vascular disease ([facility] . Coronary artery disease   . Depression   . Dyslipidemia   . GERD (gastroesophageal reflux disease)    occ  . Hypertension   . Myocardial infarct ([id number] Sciatica   . Sleep apnea    no CPAP ordered but using oxygen at bedtime  . Tobacco abuse     Patient Active Problem List   Diagnosis Date Noted  . Abnormal uterine bleeding (AUB) 04/03/2016  . Chest pain syndrome 11/14/2015  . Dysfunctional uterine bleeding 11/14/2015  . Right sided abdominal pain 11/14/2015  . Right anterior shoulder pain 11/14/2015  . Pelvic pain in female 11/14/2015  . Bacterial vaginosis (recurrent) 11/14/2015  . Chest pain 11/14/2015  . Essential hypertension 04/04/2014  . Hyperlipidemia 04/04/2014  . Coronary artery disease 04/04/2014    Past Surgical History:  Procedure Laterality Date  . CARDIAC CATHETERIZATION  10/09/2003   [place] LAD with 30% prox narrowing, 50% stenosis in mid-portion of PLA; RCA with 40% narrowing proximally (Dr. JohOrinda KennerII)  . CARDIAC CATHETERIZATION  10/10/2007   70% stenosis in first septal perforator branch of LAD, 60-70% narrowing in mid LAD, 20% narrowing in mid AV groove Cfx with 80% diffuse narrowing in small distal marginal, total occlusion of mid RCA with L to R collaterals, 90% stenosis diffusely in prox branch of RCA followed by 70% stenosis in secondary curve &  80% stenosis in small marginal branch ([name])  . CARDIAC CATHETERIZATION  05/28/2008   normal L main, RCA with 100% prox lesion w/distal filling from LAD collaterals, LAD with 20% prox tubular lesion/ 40% mid LAD lesion/previous stent patent ([name])  . CARDIAC CATHETERIZATION  09/15/2009   discrete 100% osital RCA lesion, 50% prox LAD lesion, non-obstructive disease in all coronaries ([name])  . CESAREAN SECTION    . COLONOSCOPY    . COLONOSCOPY W/ POLYPECTOMY    . CORONARY ANGIOPLASTY WITH STENT PLACEMENT  10/31/2007     PCI of distal Cfx with 2.25x89m Taxus Adam DES, 60% narrowing of mid LAD (Dr. TCorky Downs  . CORONARY ANGIOPLASTY WITH STENT PLACEMENT  06/11/2011   PCI of prox-mid LAD with 3x130mDES Resolute (Dr. T.[name] . DILATION AND CURETTAGE, DIAGNOSTIC / THERAPEUTIC    . DILITATION & CURRETTAGE/HYSTROSCOPY WITH HYDROTHERMAL ABLATION N/A 04/03/2016   Procedure: DILATATION & CURETTAGE/HYSTEROSCOPY WITH HYDROTHERMAL ABLATION;  Surgeon: ChShelly BombardMD;  [facility];  Service: Gynecology;  Laterality: N/A;  . NM MYOCAR PERF WALL MOTION  08/2009   persantine myoview - normal perfusion in all regions, perfusion defect in anterior region (breast attenuation), EF 52%, low risk scan     OB History    Gravida  8   Para      Term      Preterm      AB  6   Living  2     SAB  6   TAB      Ectopic      Multiple      Live Births  2            Home Medications    Prior to Admission medications   Medication Sig Start Date End Date Taking? Authorizing Provider  albuterol (PROVENTIL HFA;VENTOLIN HFA) 108 (90 BASE) MCG/ACT inhaler Inhale 2 puffs into the lungs every 6 (six) hours as needed for wheezing or shortness of breath.    [provider]  amLODipine (NORVASC) 10 MG tablet Take 10 mg by mouth daily.    [provider]  aspirin EC 325 MG tablet Take 325 mg by mouth daily.    [provider]  atorvastatin (LIPITOR) 40 MG tablet Take 1 tablet (40 mg total) by mouth daily. 01/04/14   KeHarden MoMD  clopidogrel (PLAVIX) 75 MG tablet Take 1 tablet (75 mg total) by mouth daily with breakfast. 01/14/18   BeLorretta HarpMD  dicyclomine (BENTYL) 20 MG tablet Take 1 tablet (20 mg total) by mouth 3 (three) times daily before meals. 04/20/18   HaShelly BombardMD  fluticasone-salmeterol (ADVAIR HFA) 11934 753 7348CG/ACT inhaler Inhale 2 puffs into the lungs 2 (two) times daily. Patient taking differently: Inhale 2 puffs into the lungs 2 (two) times daily as  needed.  01/04/14   KeHarden MoMD  lisinopril-hydrochlorothiazide (PRINZIDE,ZESTORETIC) 20-12.5 MG per tablet Take 2 tablets by mouth daily.    [provider]  nebivolol (BYSTOLIC) 10 MG tablet Take 1 tablet by mouth daily.    [provider]  nitroGLYCERIN (NITROSTAT) 0.4 MG SL tablet Place 1 tablet (0.4 mg total) under the tongue every 5 (five) minutes as needed for chest pain. 01/14/18   BeLorretta HarpMD  pantoprazole (PROTONIX) 20 MG tablet Take 2 tablets (40 mg total) by mouth 2 (two) times daily. 04/27/18   Armbruster, StCarlota RaspberryMD  sertraline (ZOLOFT) 50  MG tablet Take 50 mg by mouth daily.    [provider]  SUPREP BOWEL PREP KIT 17.5-3.13-1.6 GM/177ML SOLN Suprep-Use as directed 04/27/18   Armbruster, Carlota Raspberry, MD  zolpidem (AMBIEN) 10 MG tablet Take 10 mg by mouth at bedtime as needed for sleep.    [provider]    Family History Family History  Problem Relation Age of Onset  . Leukemia Mother   . Clotting disorder Father        blood clot  . Hypertension Sister   . Diabetes Sister   . Stroke Sister 68  . Bleeding Disorder Son        ITP "free bleeding disorder"    Social History Social History   Tobacco Use  . Smoking status: Current Every Day Smoker    Packs/day: 0.50    Years: 0.00    Pack years: 0.00    Types: Cigarettes  . Smokeless tobacco: Never Used  Substance Use Topics  . Alcohol use: No    Alcohol/week: 0.0 oz    Comment: seldom  . Drug use: Yes    Types: Marijuana    Comment: Occassionally, last time May 2019     Allergies   Other; Amoxicillin; Dilaudid [hydromorphone hcl]; and Latex   Review of Systems Review of Systems  Constitutional: Negative for fever.  HENT: Negative for rhinorrhea and sore throat.   Eyes: Negative for redness.  Respiratory: Negative for cough and shortness of breath.   Cardiovascular: Negative for chest pain.  Gastrointestinal: Positive for abdominal pain. Negative for  blood in stool, constipation, diarrhea, nausea and vomiting.  Genitourinary: Negative for dysuria.  Musculoskeletal: Negative for myalgias.  Skin: Negative for rash.  Neurological: Negative for headaches.     Physical Exam Updated Vital Signs BP (!) 189/137 (BP Location: Left Arm)   Pulse 70   Temp 98.2 F (36.8 C) (Oral)   Resp 20   Ht 5' 1"  (1.549 m)   Wt 96.2 kg (212 lb)   LMP  (LMP Unknown) Comment: verified BEFORE imaging  SpO2 100%   BMI 40.06 kg/m   Physical Exam  Constitutional: She appears well-developed and well-nourished. She appears distressed (Tearful).  HENT:  Head: Normocephalic and atraumatic.  Eyes: Conjunctivae are normal. Right eye exhibits no discharge. Left eye exhibits no discharge.  Neck: Normal range of motion. Neck supple.  Cardiovascular: Normal rate, regular rhythm and normal heart sounds.  Pulmonary/Chest: Effort normal and breath sounds normal.  Abdominal: Soft. There is generalized tenderness (Patient cries and becomes tearful and winces with even light palpation in any part of the abdomen.  She has no focal pain.  She has heat packs applied across her lower abdomen.). There is no rebound, no guarding, no tenderness at McBurney's point and negative Murphy's sign.  Neurological: She is alert.  Skin: Skin is warm and dry.  Psychiatric: She has a normal mood and affect.  Nursing note and vitals reviewed.    ED Treatments / Results  Labs (all labs ordered are listed, but only abnormal results are displayed) Labs Reviewed  COMPREHENSIVE METABOLIC PANEL - Abnormal; Notable for the following components:      Result Value   Potassium 3.3 (*)    Glucose, Bld 105 (*)    Creatinine, Ser 1.09 (*)    GFR calc non Af Amer 56 (*)    All other components within normal limits  CBC - Abnormal; Notable for the following components:   RDW 15.6 (*)  All other components within normal limits  URINALYSIS, ROUTINE W REFLEX MICROSCOPIC - Abnormal; Notable  for the following components:   APPearance HAZY (*)    All other components within normal limits  LIPASE, BLOOD  I-STAT BETA HCG BLOOD, ED ([facility], [facility], [facility] ONLY)    EKG None  Radiology No results found.  Procedures Procedures (including critical care time)  Medications Ordered in ED Medications  oxyCODONE (Oxy IR/ROXICODONE) immediate release tablet 10 mg (has no administration in time range)  oxyCODONE-acetaminophen (PERCOCET/ROXICET) 5-325 MG per tablet 1 tablet (1 tablet Oral Given 05/04/18 1802)     Initial Impression / Assessment and Plan / ED Course  I have reviewed the triage vital signs and the nursing notes.  Pertinent labs & imaging results that were available during my care of the patient were reviewed by me and considered in my medical decision making (see chart for details).     Patient seen and examined.  Reviewed recent visits and work-up.  We discussed her results tonight are unchanged from previous.  Vital signs reviewed and are as follows: BP (!) 187/113 (BP Location: Right Arm)   Pulse 70   Temp 98.2 F (36.8 C) (Oral)   Resp 18   Ht 5' 1"  (1.549 m)   Wt 96.2 kg (212 lb)   LMP  (LMP Unknown) Comment: verified BEFORE imaging  SpO2 100%   BMI 40.06 kg/m   We will give patient additional dose of oxycodone 10 mg.  She will be discharged home with Bentyl.  She is not requesting any other medications for home.  I have strongly encouraged her to follow-up with her GI physician to have upper and lower endoscopy performed as planned in July.  The patient was urged to return to the Emergency Department immediately with worsening of current symptoms, worsening abdominal pain, persistent vomiting, blood noted in stools, fever, or any other concerns. The patient verbalized understanding.    Final Clinical Impressions(s) / ED Diagnoses   Final diagnoses:  Chronic abdominal pain  Hypertension, unspecified type   Patient with chronic abdominal pain.  She is  tearful and distressed.  She has no focal pain on exam.  She has had recent extensive work-up including CT imaging, GYN evaluation, ultrasound of the right upper quadrant.  Unfortunately these did not show a treatable cause of pain at this point.  Plan as listed above by GI.  Lab work today is unchanged and pain is similar to previous.  I do not feel she repeat requires repeat imaging at this point and the patient agrees.  She has a cardiac history but is adamant that her symptoms today are not similar to previous cardiac pain and it would be unlikely that she had similar pain ongoing for the past month.  ED Discharge Orders        Ordered    dicyclomine (BENTYL) 20 MG tablet  3 times daily before meals     05/04/18 2007       Carlisle Cater, Hershal Coria 05/04/18 2014    Valarie Merino, MD 05/04/18 2326

## 2018-05-04 NOTE — ED Notes (Signed)
See providers notes

## 2018-06-16 ENCOUNTER — Telehealth: Payer: Self-pay | Admitting: Gastroenterology

## 2018-06-16 ENCOUNTER — Encounter: Payer: Medicaid Other | Admitting: Gastroenterology

## 2018-06-16 ENCOUNTER — Ambulatory Visit (AMBULATORY_SURGERY_CENTER): Payer: Medicaid Other | Admitting: Gastroenterology

## 2018-06-16 ENCOUNTER — Encounter: Payer: Self-pay | Admitting: Gastroenterology

## 2018-06-16 VITALS — BP 159/114 | HR 88 | Temp 99.1°F | Resp 16 | Ht 60.0 in | Wt 215.0 lb

## 2018-06-16 DIAGNOSIS — D125 Benign neoplasm of sigmoid colon: Secondary | ICD-10-CM | POA: Diagnosis not present

## 2018-06-16 DIAGNOSIS — K319 Disease of stomach and duodenum, unspecified: Secondary | ICD-10-CM | POA: Diagnosis not present

## 2018-06-16 DIAGNOSIS — D123 Benign neoplasm of transverse colon: Secondary | ICD-10-CM | POA: Diagnosis not present

## 2018-06-16 DIAGNOSIS — D124 Benign neoplasm of descending colon: Secondary | ICD-10-CM

## 2018-06-16 DIAGNOSIS — D120 Benign neoplasm of cecum: Secondary | ICD-10-CM

## 2018-06-16 DIAGNOSIS — K2980 Duodenitis without bleeding: Secondary | ICD-10-CM

## 2018-06-16 DIAGNOSIS — Z86010 Personal history of colonic polyps: Secondary | ICD-10-CM

## 2018-06-16 DIAGNOSIS — D127 Benign neoplasm of rectosigmoid junction: Secondary | ICD-10-CM

## 2018-06-16 DIAGNOSIS — R1013 Epigastric pain: Secondary | ICD-10-CM

## 2018-06-16 DIAGNOSIS — D122 Benign neoplasm of ascending colon: Secondary | ICD-10-CM

## 2018-06-16 MED ORDER — SODIUM CHLORIDE 0.9 % IV SOLN: 500.0000 mL | Freq: Once | INTRAVENOUS | Status: DC

## 2018-06-16 NOTE — Progress Notes (Signed)
Report given to PACU, vss    Late entry   1615 albuterol treatment given vss

## 2018-06-16 NOTE — Progress Notes (Signed)
Called to room to assist during endoscopic procedure.  Patient ID and intended procedure confirmed with present staff. Received instructions for my participation in the procedure from the performing physician.  

## 2018-06-16 NOTE — Progress Notes (Signed)
Patient used marijuana last night.

## 2018-06-16 NOTE — Patient Instructions (Signed)
Thank you for allowing [place] to care for you today!  Await pathology results by mail approximately 2 weeks.  Repeat colonoscopy in 6 months.  Resume Plavix in 3 days, Ok to resume baby aspirin today.  Resume other medications today.        YOU HAD AN ENDOSCOPIC PROCEDURE TODAY AT [facility]:   Refer to the procedure report that was given to you for any specific questions about what was found during the examination.  If the procedure report does not answer your questions, please call your gastroenterologist to clarify.  If you requested that your care partner not be given the details of your procedure findings, then the procedure report has been included in a sealed envelope for you to review at your convenience later.  YOU SHOULD EXPECT: Some feelings of bloating in the abdomen. Passage of more gas than usual.  Walking can help get rid of the air that was put into your GI tract during the procedure and reduce the bloating. If you had a lower endoscopy (such as a colonoscopy or flexible sigmoidoscopy) you may notice spotting of blood in your stool or on the toilet paper. If you underwent a bowel prep for your procedure, you may not have a normal bowel movement for a few days.  Please Note:  You might notice some irritation and congestion in your nose or some drainage.  This is from the oxygen used during your procedure.  There is no need for concern and it should clear up in a day or so.  SYMPTOMS TO REPORT IMMEDIATELY:   Following lower endoscopy (colonoscopy or flexible sigmoidoscopy):  Excessive amounts of blood in the stool  Significant tenderness or worsening of abdominal pains  Swelling of the abdomen that is new, acute  Fever of 100F or higher   Following upper endoscopy (EGD)  Vomiting of blood or coffee ground material  New chest pain or pain under the shoulder blades  Painful or persistently difficult swallowing  New shortness of breath  Fever of 100F or  higher  Black, tarry-looking stools  For urgent or emergent issues, a gastroenterologist can be reached at any hour by calling [phone].   DIET:  We do recommend a small meal at first, but then you may proceed to your regular diet.  Drink plenty of fluids but you should avoid alcoholic beverages for 24 hours.  ACTIVITY:  You should plan to take it easy for the rest of today and you should NOT DRIVE or use heavy machinery until tomorrow (because of the sedation medicines used during the test).    FOLLOW UP: Our staff will call the number listed on your records the next business day following your procedure to check on you and address any questions or concerns that you may have regarding the information given to you following your procedure. If we do not reach you, we will leave a message.  However, if you are feeling well and you are not experiencing any problems, there is no need to return our call.  We will assume that you have returned to your regular daily activities without incident.  If any biopsies were taken you will be contacted by phone or by letter within the next 1-3 weeks.  Please call us at [phone] if you have not heard about the biopsies in 3 weeks.    SIGNATURES/CONFIDENTIALITY: You and/or your care partner have signed paperwork which will be entered into your electronic medical record.  These  signatures attest to the fact that that the information above on your After Visit Summary has been reviewed and is understood.  Full responsibility of the confidentiality of this discharge information lies with you and/or your care-partner. 

## 2018-06-16 NOTE — Op Note (Signed)
[facility] Patient Name: Tracey Manning Procedure Date: 06/16/2018 3:38 PM MRN: [id number] Endoscopist: Remo Lipps P. Havery Moros , MD Age: 57 Referring MD:  Date of Birth: [birth year] Gender: Female Account #: [id number] Procedure:                Colonoscopy Indications:              High risk colon cancer surveillance: Personal                            history of colonic polyps Medicines:                Monitored Anesthesia Care Procedure:                Pre-Anesthesia Assessment:                           - Prior to the procedure, a History and Physical                            was performed, and patient medications and                            allergies were reviewed. The patient's tolerance of                            previous anesthesia was also reviewed. The risks                            and benefits of the procedure and the sedation                            options and risks were discussed with the patient.                            All questions were answered, and informed consent                            was obtained. Prior Anticoagulants: The patient has                            taken Plavix (clopidogrel), last dose was 2 days                            prior to procedure. ASA Grade Assessment: [name] - A                            patient with severe systemic disease. After                            reviewing the risks and benefits, the patient was                            deemed in satisfactory condition to undergo the  procedure.                           After obtaining informed consent, the colonoscope                            was passed under direct vision. Throughout the                            procedure, the patient's blood pressure, pulse, and                            oxygen saturations were monitored continuously. The                            Model PCF-H190DL [id number]) scope was introduced                 through the anus and advanced to the the terminal                            ileum, with identification of the appendiceal                            orifice and IC valve. The colonoscopy was performed                            without difficulty. The patient tolerated the                            procedure well. The quality of the bowel                            preparation was fair. The terminal ileum, ileocecal                            valve, appendiceal orifice, and rectum were                            photographed. Scope In: 3:57:13 PM Scope Out: 4:38:07 PM Scope Withdrawal Time: 0 hours 22 minutes 3 seconds  Total Procedure Duration: 0 hours 40 minutes 54 seconds  Findings:                 The perianal and digital rectal examinations were                            normal.                           The terminal ileum appeared normal.                           A 3 mm polyp was found in the ileocecal valve. The                            polyp was sessile.  The polyp was removed with a                            cold snare. Resection and retrieval were complete.                           A 3 mm polyp was found in the ascending colon. The                            polyp was sessile. The polyp was removed with a                            cold snare. Resection and retrieval were complete.                           A 12 mm polyp was found in the transverse colon.                            The polyp was sessile. The polyp was removed with a                            piecemeal technique using a cold snare. Resection                            and retrieval were complete. Area just distal to                            the polypectomy site was tattooed with an injection                            of Spot (carbon black).                           Three sessile polyps were found in the transverse                            colon. The polyps were 4 to 8 mm in size. These                             polyps were removed with a cold snare. Resection                            and retrieval were complete.                           A 5 mm polyp was found in the descending colon. The                            polyp was sessile. The polyp was removed with a                            cold snare. Resection and retrieval were  complete.                           A 12 mm polyp was found in the sigmoid colon. The                            polyp was pedunculated. The polyp was removed with                            a hot snare. Resection and retrieval were complete.                           Two sessile polyps were found in the sigmoid colon.                            The polyps were 5 to 7 mm in size. These polyps                            were removed with a cold snare. Resection and                            retrieval were complete.                           A 5 mm polyp was found in the recto-sigmoid colon.                            The polyp was sessile. The polyp was removed with a                            cold snare. Resection and retrieval were complete.                           Multiple medium-mouthed diverticula were found in                            the entire colon.                           The prep was fair, time taken to lavage the colon,                            some areas small or flat polyps may not have been                            visualized. The exam was otherwise without                            abnormality. Complications:            No immediate complications. Estimated blood loss:                            Minimal. Estimated Blood Loss:  Estimated blood loss was minimal. Impression:               - Preparation of the colon was fair.                           - The examined portion of the ileum was normal.                           - One 3 mm polyp at the ileocecal valve, removed                            with a cold  snare. Resected and retrieved.                           - One 3 mm polyp in the ascending colon, removed                            with a cold snare. Resected and retrieved.                           - One 12 mm polyp in the transverse colon, removed                            piecemeal using a cold snare. Resected and                            retrieved. Tattooed.                           - Three 4 to 8 mm polyps in the transverse colon,                            removed with a cold snare. Resected and retrieved.                           - One 5 mm polyp in the descending colon, removed                            with a cold snare. Resected and retrieved.                           - One 12 mm polyp in the sigmoid colon, removed                            with a hot snare. Resected and retrieved.                           - Two 5 to 7 mm polyps in the sigmoid colon,                            removed with a cold snare. Resected and retrieved.                           -  One 5 mm polyp at the recto-sigmoid colon,                            removed with a cold snare. Resected and retrieved.                           - Diverticulosis in the entire examined colon.                           - The examination was otherwise normal. Recommendation:           - Patient has a contact number available for                            emergencies. The signs and symptoms of potential                            delayed complications were discussed with the                            patient. Return to normal activities tomorrow.                            Written discharge instructions were provided to the                            patient.                           - Resume previous diet.                           - Continue present medications.                           - Resume Plavix in 3 days, okay to take baby                            aspirin at this time                           - Await  pathology results.                           - Repeat colonoscopy in 6 months for surveillance                            after piecemeal polypectomy and fair prep noted on                            this exam. Carlota Raspberry. Basim Bartnik, MD 06/16/2018 4:48:54 PM This report has been signed electronically.

## 2018-06-16 NOTE — Op Note (Signed)
[facility] Patient Name: Ravleen Ries Procedure Date: 06/16/2018 3:39 PM MRN: [id number] Endoscopist: Remo Lipps P. Havery Moros , MD Age: 57 Referring MD:  Date of Birth: 1961-11-20 Gender: Female Account #: [id number] Procedure:                Upper GI endoscopy Indications:              Epigastric abdominal pain Medicines:                Monitored Anesthesia Care Procedure:                Pre-Anesthesia Assessment:                           - Prior to the procedure, a History and Physical                            was performed, and patient medications and                            allergies were reviewed. The patient's tolerance of                            previous anesthesia was also reviewed. The risks                            and benefits of the procedure and the sedation                            options and risks were discussed with the patient.                            All questions were answered, and informed consent                            was obtained. Prior Anticoagulants: The patient has                            taken Plavix (clopidogrel), last dose was 2 days                            prior to procedure. ASA Grade Assessment: [name] - A                            patient with severe systemic disease. After                            reviewing the risks and benefits, the patient was                            deemed in satisfactory condition to undergo the                            procedure.  After obtaining informed consent, the endoscope was                            passed under direct vision. Throughout the                            procedure, the patient's blood pressure, pulse, and                            oxygen saturations were monitored continuously. The                            Endoscope was introduced through the mouth, and                            advanced to the second part of duodenum. The upper                        GI endoscopy was accomplished without difficulty.                            The patient tolerated the procedure well. Scope In: Scope Out: Findings:                 Esophagogastric landmarks were identified: the                            Z-line was found at 35 cm, the gastroesophageal                            junction was found at 35 cm and the upper extent of                            the gastric folds was found at 37 cm from the                            incisors.                           A 2 cm hiatal hernia was present.                           Mucosal changes characterized by slight nodularity                            were found at the gastroesophageal junction. I                            suspect inflammatory in nature, biopsies were taken                            with a cold forceps for histology.                           The exam of the esophagus  was otherwise normal.                           Patchy mild inflammation characterized by erosions                            and erythema was found in the gastric body and in                            the gastric antrum. Biopsies were taken with a cold                            forceps for Helicobacter pylori testing.                           The exam of the stomach was otherwise normal.                           A single small nodule / prominent fold was found in                            the duodenal bulb. Biopsies were taken with a cold                            forceps for histology.                           The exam of the duodenum was otherwise normal. Complications:            No immediate complications. Estimated blood loss:                            Minimal. Estimated Blood Loss:     Estimated blood loss was minimal. Impression:               - Esophagogastric landmarks identified.                           - 2 cm hiatal hernia.                           - Suspect inflammatory mild  nodularity at the GEJ                            Biopsied.                           - Gastritis. Biopsied.                           - Nodule vs prominent fold found in the duodenum.                            Biopsied. Recommendation:           - Patient has a contact number available for  emergencies. The signs and symptoms of potential                            delayed complications were discussed with the                            patient. Return to normal activities tomorrow.                            Written discharge instructions were provided to the                            patient.                           - Resume previous diet.                           - Continue present medications.                           - Resume Plavix in 3 days per colonoscopy note                           - Await pathology results.                           - Continue protonix Davis Ambrosini P. Barret Esquivel, MD 06/16/2018 4:54:46 PM This report has been signed electronically.

## 2018-06-16 NOTE — Telephone Encounter (Signed)
Received phone note in admitting that patient has thrown up her prep. Called patient. Patient states she completed the entire first part of her prep last night without vomiting. Patient began 2nd half of prep this am. Drank prep from 0800-0830, drank water at 0915 and immediately began vomiting with diarrhea. Patient states stool result is clear to cloudy to very light brown without any particles and she can see to the bottom of the bowl. Dr. Havery Moros notified and has instructed me to ask patient to obtain Mirilax and Gatorade, Mix 1/2 of a 230 g bottle of Mirilax with 32 oz. Of Gatorade and drink all of it. Relayed these instructions to patient, patient verbalizes understanding and will call us if she continues to have difficulty.

## 2018-06-21 ENCOUNTER — Telehealth: Payer: Self-pay

## 2018-06-21 NOTE — Telephone Encounter (Signed)
The procedure was done to evaluate epigastric pain and present prior to the procedure, I don't think related. She will be contacted with pathology results and further recommendations.

## 2018-06-21 NOTE — Telephone Encounter (Signed)
Opened in error. maw 

## 2018-06-21 NOTE — Telephone Encounter (Signed)
Returned call to pt and explained that the epigastric/abdominal pain was why we did the procedures and per Dr. Robet Leu we will need to wait for pathology results before he can make any further recommendations.  Pt said "okay". maw

## 2018-06-21 NOTE — Telephone Encounter (Deleted)
  Follow up Call-  Call back number 06/16/2018  Post procedure Call Back phone  # [phone]  Permission to leave phone message Yes  Some recent data might be hidden     Patient questions:  Do you have a fever, pain , or abdominal swelling? Yes.   Pain Score  Same abdominal pain pt was having pre-procedure *  Have you tolerated food without any problems? Yes.    Have you been able to return to your normal activities? Yes.    Do you have any questions about your discharge instructions: Diet   No. Medications  No. Follow up visit  No.  Do you have questions or concerns about your Care? Yes.    Actions: * If pain score is 4 or above: No action needed, pain <4.   Pt is still having the sane epigastric pain she was having pre-procedure.  Pt said she is eating but her ap

## 2018-06-21 NOTE — Telephone Encounter (Signed)
  Follow up Call-  Call back number 06/16/2018  Post procedure Call Back phone  # [phone]  Permission to leave phone message Yes  Some recent data might be hidden     Patient questions:  Do you have a fever, pain , or abdominal swelling? Yes.   Pain Score  8 *  Have you tolerated food without any problems? Yes.    Have you been able to return to your normal activities? No.  Do you have any questions about your discharge instructions: Diet   No. Medications  No. Follow up visit  No.  Do you have questions or concerns about your Care? Yes.    Actions: * If pain score is 4 or above: No action needed, pain <4.  Per pt she is having the same epigastric pain she was having pre-procedure.  Pt rated pain 8 out of 10.  Pt reported she is eating but appetite has lessen.  I asked pt if her activity was normal and she said she was retired and woke up early each day and did not do much through the day.  I advised pt I would forward this phone note to Dr. Havery Moros, but since it was the same pain as she had before procedures we may need to waite on biopsies.  We would call her back if anything else to do other than what Dr. Havery Moros recommended procedure day.  Pt states she understood. maw

## 2018-06-23 ENCOUNTER — Telehealth: Payer: Self-pay | Admitting: Gastroenterology

## 2018-06-29 ENCOUNTER — Other Ambulatory Visit: Payer: Self-pay | Admitting: Obstetrics

## 2018-06-29 DIAGNOSIS — G8929 Other chronic pain: Secondary | ICD-10-CM

## 2018-06-29 DIAGNOSIS — R102 Pelvic and perineal pain: Principal | ICD-10-CM

## 2018-07-18 ENCOUNTER — Other Ambulatory Visit (HOSPITAL_BASED_OUTPATIENT_CLINIC_OR_DEPARTMENT_OTHER): Payer: Self-pay

## 2018-07-18 DIAGNOSIS — G4730 Sleep apnea, unspecified: Secondary | ICD-10-CM

## 2018-08-04 ENCOUNTER — Telehealth: Payer: Self-pay | Admitting: Cardiovascular Disease

## 2018-08-04 NOTE — Telephone Encounter (Signed)
New Message:   Patietn calling and would like to know if she can get Medical cleared to get her teeth clean. Patient stated that they gave her a letter last year.

## 2018-08-04 NOTE — Telephone Encounter (Signed)
Pt called to request that we provide her with her "yearly" letter that we have provided her a couple of years... 2/16 and 3/17... For her dental cleaning. She is calling ahead of time because she goes through a dental program and has to have it or they will not see her. Advised her will check with Dr. Gwenlyn Found to be sure she is not due for any cardiac testing and if ok to mail the letter to her and will notify her with his recommendations. She is only planning to have a yearly dental cleaning. Last OV 01/14/18.

## 2018-08-09 NOTE — Telephone Encounter (Signed)
No testing needs to be performed for dental cleaning

## 2018-08-11 NOTE — Telephone Encounter (Signed)
Left detailed message (ok per DPR) to make patient aware of recommendations.   Advised to call back with questions or concerns.

## 2018-08-24 ENCOUNTER — Encounter: Payer: Self-pay | Admitting: Gastroenterology

## 2018-08-24 ENCOUNTER — Ambulatory Visit: Payer: Medicaid Other | Admitting: Gastroenterology

## 2018-08-24 VITALS — BP 132/82 | HR 74 | Ht 60.0 in | Wt 209.0 lb

## 2018-08-24 DIAGNOSIS — Z86010 Personal history of colonic polyps: Secondary | ICD-10-CM

## 2018-08-24 DIAGNOSIS — K2270 Barrett's esophagus without dysplasia: Secondary | ICD-10-CM | POA: Diagnosis not present

## 2018-08-24 DIAGNOSIS — R109 Unspecified abdominal pain: Secondary | ICD-10-CM | POA: Diagnosis not present

## 2018-08-24 DIAGNOSIS — K3189 Other diseases of stomach and duodenum: Secondary | ICD-10-CM

## 2018-08-24 DIAGNOSIS — K31A0 Gastric intestinal metaplasia, unspecified: Secondary | ICD-10-CM

## 2018-08-24 NOTE — Progress Notes (Signed)
HPI :  57 year old female here for follow-up visit. Please see intake note on June 5 for full description of her history. She is here for follow-up for abdominal pain. She previously had this both in her upper and lower abdomen over the past few years and was present all the time and never went away. She's had evaluation with CT scans which did not show any clear cause for her symptoms. She even tried on multiple regimens which did not provide benefit for her symptoms.  Since of last seen her she had a colonoscopy and upper endoscopy performed in July. Colonoscopy was remarkable for fair prep, however 11 polyps removed, all adenomas, one large and removed in piecemeal. EGD was remarkable for short segment of Barrett's esophagus as well as gastric intestinal metaplasia. H. Pylori testing was negative. She also had an ultrasound of her right upper quadrant performed in June which showed no gallstones, but remarkable for hepatic steatosis.  Since I have last seen her she's been doing really well. She's been taking Protonix for her reflux at higher dose, she does not have any symptoms on the regimen. She states her bowels have been regular for the most part. She is eating okay. No nausea or vomiting. She reports after her colonoscopy her pain has for the most part gone away. She denies making any changes otherwise in medications other than increase in protonix. She states she had no pain for the past few months and it's the best she is felt in a while.   [place] 04/29/18 - steatosis, otherwise okay  Colonoscopy 06/16/2018 - nl ileum, 11 polyps - one large and removed in piecemeal, fair prep  EGD 06/16/2018 - 2cm HH, GEJ mild nodularity, mild gastritis, prominent duodenal fold - HP negative, biopsies (+) for GIM, biopsies positive for nondysplastic BE  CT scan abdomen / pelvis 04/13/2018 - DJD L5-S1, diverticulosis, renal cyst, hepatic steatosis, CAD with borderline cardiomegaly. Normal gallbladder.  CT abdomen  / pelvis 11/14/2015 - no acute findings    Past Medical History:  Diagnosis Date  . Anxiety   . Arthritis   . Asthma   . Chest pain   . Collagen vascular disease ([place])   . Coronary artery disease   . Depression   . Dyslipidemia   . GERD (gastroesophageal reflux disease)    occ  . Hypertension   . Myocardial infarct ([place]) 1017,5102  . Sciatica   . Sleep apnea    no CPAP ordered but using oxygen at bedtime  . Tobacco abuse      Past Surgical History:  Procedure Laterality Date  . CARDIAC CATHETERIZATION  10/09/2003   [place], [place]) - LAD with 30% prox narrowing, 50% stenosis in mid-portion of PLA; RCA with 40% narrowing proximally ([name], [name])  . CARDIAC CATHETERIZATION  10/10/2007   70% stenosis in first septal perforator branch of LAD, 60-70% narrowing in mid LAD, 20% narrowing in mid AV groove Cfx with 80% diffuse narrowing in small distal marginal, total occlusion of mid RCA with L to R collaterals, 90% stenosis diffusely in prox branch of RCA followed by 70% stenosis in secondary curve & 80% stenosis in small marginal branch ([name])  . CARDIAC CATHETERIZATION  05/28/2008   normal L main, RCA with 100% prox lesion w/distal filling from LAD collaterals, LAD with 20% prox tubular lesion/ 40% mid LAD lesion/previous stent patent ([name])  . CARDIAC CATHETERIZATION  09/15/2009   discrete 100% osital RCA  lesion, 50% prox LAD lesion, non-obstructive disease in all coronaries ([name])  . CESAREAN SECTION    . COLONOSCOPY    . COLONOSCOPY W/ POLYPECTOMY    . CORONARY ANGIOPLASTY WITH STENT PLACEMENT  10/31/2007   PCI of distal Cfx with 2.25x52mm Taxus Adam DES, 60% narrowing of mid LAD ([name])  . CORONARY ANGIOPLASTY WITH STENT PLACEMENT  06/11/2011   PCI of prox-mid LAD with 3x37mm DES Resolute ([name])  . DILATION AND CURETTAGE, DIAGNOSTIC / THERAPEUTIC    . DILITATION & CURRETTAGE/HYSTROSCOPY WITH HYDROTHERMAL  ABLATION N/A 04/03/2016   Procedure: DILATATION & CURETTAGE/HYSTEROSCOPY WITH HYDROTHERMAL ABLATION;  Surgeon: [name], MD;  Location: [facility];  Service: Gynecology;  Laterality: N/A;  . NM MYOCAR PERF WALL MOTION  08/2009   persantine myoview - normal perfusion in all regions, perfusion defect in anterior region (breast attenuation), EF 52%, low risk scan   Family History  Problem Relation Age of Onset  . Leukemia Mother   . Clotting disorder Father        blood clot  . Hypertension Sister   . Diabetes Sister   . Stroke Sister 68  . Bleeding Disorder Son        ITP "free bleeding disorder"  . Colon cancer Paternal Grandmother   . Diabetes Paternal Grandmother   . Stomach cancer Neg Hx   . Pancreatic cancer Neg Hx    Social History   Tobacco Use  . Smoking status: Current Every Day Smoker    Packs/day: 0.50    Years: 0.00    Pack years: 0.00    Types: Cigarettes  . Smokeless tobacco: Never Used  Substance Use Topics  . Alcohol use: No    Alcohol/week: 0.0 standard drinks    Comment: seldom  . Drug use: Yes    Types: Marijuana    Comment: used last night   Current Outpatient Medications  Medication Sig Dispense Refill  . albuterol (PROVENTIL HFA;VENTOLIN HFA) 108 (90 BASE) MCG/ACT inhaler Inhale 2 puffs into the lungs every 6 (six) hours as needed for wheezing or shortness of breath.    Marland Kitchen amLODipine (NORVASC) 10 MG tablet Take 10 mg by mouth daily.    Marland Kitchen aspirin EC 325 MG tablet Take 325 mg by mouth daily.    Marland Kitchen atorvastatin (LIPITOR) 40 MG tablet Take 1 tablet (40 mg total) by mouth daily. 30 tablet 2  . clopidogrel (PLAVIX) 75 MG tablet Take 1 tablet (75 mg total) by mouth daily with breakfast. 30 tablet 2  . dicyclomine (BENTYL) 20 MG tablet Take 1 tablet (20 mg total) by mouth 3 (three) times daily before meals. 90 tablet 2  . fluticasone-salmeterol (ADVAIR HFA) 115-21 MCG/ACT inhaler Inhale 2 puffs into the lungs 2 (two) times daily. (Patient taking differently:  Inhale 2 puffs into the lungs 2 (two) times daily as needed. ) 1 Inhaler 12  . lisinopril-hydrochlorothiazide (PRINZIDE,ZESTORETIC) 20-12.5 MG per tablet Take 2 tablets by mouth daily.    . nebivolol (BYSTOLIC) 10 MG tablet Take 1 tablet by mouth daily.    . nitroGLYCERIN (NITROSTAT) 0.4 MG SL tablet Place 1 tablet (0.4 mg total) under the tongue every 5 (five) minutes as needed for chest pain. 25 tablet 6  . pantoprazole (PROTONIX) 20 MG tablet Take 2 tablets (40 mg total) by mouth 2 (two) times daily. 30 tablet 0  . sertraline (ZOLOFT) 50 MG tablet Take 50 mg by mouth daily.    Marland Kitchen zolpidem (AMBIEN)  10 MG tablet Take 10 mg by mouth at bedtime as needed for sleep.     No current facility-administered medications for this visit.    Allergies  Allergen Reactions  . Other Anaphylaxis and Hives    Fresh strawberries (throat swelled)  . Amoxicillin Nausea And Vomiting  . Dilaudid [Hydromorphone Hcl] Nausea And Vomiting  . Ibuprofen Other (See Comments)    wheezing  . Latex Swelling    No reaction with bandaids     Review of Systems: All systems reviewed and negative except where noted in HPI.   Lab Results  Component Value Date   WBC 7.2 05/04/2018   HGB 14.5 05/04/2018   HCT 42.6 05/04/2018   MCV 84.2 05/04/2018   PLT 253 05/04/2018    Lab Results  Component Value Date   CREATININE 1.09 (H) 05/04/2018   BUN 11 05/04/2018   NA 140 05/04/2018   K 3.3 (L) 05/04/2018   CL 102 05/04/2018   CO2 28 05/04/2018    Lab Results  Component Value Date   ALT 17 05/04/2018   AST 17 05/04/2018   ALKPHOS 62 05/04/2018   BILITOT 0.6 05/04/2018     Physical Exam: BP 132/82   Pulse 74   Ht 5' (1.524 m)   Wt 209 lb (94.8 kg)   LMP  (LMP Unknown) Comment: verified BEFORE imaging  BMI 40.82 kg/m  Constitutional: Pleasant,well-developed, female in no acute distress. HEENT: Normocephalic and atraumatic. Conjunctivae are normal. No scleral icterus. Neck supple.  Cardiovascular:  Normal rate, regular rhythm.  Pulmonary/chest: Effort normal and breath sounds normal. No wheezing, rales or rhonchi. Abdominal: Soft, nondistended, nontender. There are no masses palpable. No hepatomegaly. Extremities: no edema Lymphadenopathy: No cervical adenopathy noted. Neurological: Alert and oriented to person place and time. Skin: Skin is warm and dry. No rashes noted. Psychiatric: Normal mood and affect. Behavior is normal.   ASSESSMENT AND PLAN: 57 year old female here for reassessment of the following issues:  Abdominal pain - overall she is not in any discomfort since her procedures. She is now on higher dose of protonix but that would not have improved her lower pain. Unclear what has changed otherwise. She did not seem constipated prior to colonoscopy, but unsure if bowel preparation made her feel better. She'll monitor for recurrence continue her regimen. CT scans were reassuring.  Barrett's esophagus - short segment noted on upper endoscopy with faint mild nodularity in one focal area felt to be inflammatory. On higher dose PPI she has no symptoms. Will plan on re-evaluating the area in a few months during EGD while on higher dose PPI, to be done at same time as colonoscopy. She agreed.  Gastric intestinal metaplasia - no FH of gastric cancer. We discussed that theoretically can increase the risk for gastric cancer however the risk is thought to remain low in [place]. We'll plan on repeating EGD at the same time as her colonoscopy to obtain more biopsies, assess extent of GIM which may influence  future surveillance recommendations.  History of colon polyps - greater than 10 adenomas removed during her colonoscopy, one large removed in piecemeal. She also had a fair bowel prep at that time. I'm recommending repeat colonoscopy with double prep to ensure no missed lesions given the limited prep on last exam and reassess the large polypectomy site. Following discussion risks and  benefits she wanted to proceed. We'll plan on doing this in January.  Otsego Cellar, MD [facility]

## 2018-08-29 ENCOUNTER — Ambulatory Visit (HOSPITAL_BASED_OUTPATIENT_CLINIC_OR_DEPARTMENT_OTHER): Payer: Medicaid Other | Attending: Internal Medicine | Admitting: Internal Medicine

## 2018-08-29 VITALS — Ht 61.0 in | Wt 206.0 lb

## 2018-08-29 DIAGNOSIS — G4730 Sleep apnea, unspecified: Secondary | ICD-10-CM

## 2018-08-29 DIAGNOSIS — G4733 Obstructive sleep apnea (adult) (pediatric): Secondary | ICD-10-CM | POA: Diagnosis not present

## 2018-09-05 ENCOUNTER — Other Ambulatory Visit: Payer: Self-pay

## 2018-09-05 ENCOUNTER — Emergency Department (HOSPITAL_COMMUNITY): Payer: Medicaid Other

## 2018-09-05 ENCOUNTER — Encounter (HOSPITAL_COMMUNITY): Payer: Self-pay | Admitting: Emergency Medicine

## 2018-09-05 ENCOUNTER — Emergency Department (HOSPITAL_COMMUNITY)
Admission: EM | Admit: 2018-09-05 | Discharge: 2018-09-06 | Disposition: A | Discharge status: Home / Self Care | Payer: Medicaid Other | Attending: Emergency Medicine | Admitting: Emergency Medicine

## 2018-09-05 DIAGNOSIS — I10 Essential (primary) hypertension: Secondary | ICD-10-CM | POA: Insufficient documentation

## 2018-09-05 DIAGNOSIS — R079 Chest pain, unspecified: Secondary | ICD-10-CM | POA: Insufficient documentation

## 2018-09-05 DIAGNOSIS — Z7982 Long term (current) use of aspirin: Secondary | ICD-10-CM | POA: Insufficient documentation

## 2018-09-05 DIAGNOSIS — F17210 Nicotine dependence, cigarettes, uncomplicated: Secondary | ICD-10-CM | POA: Diagnosis not present

## 2018-09-05 DIAGNOSIS — I2510 Atherosclerotic heart disease of native coronary artery without angina pectoris: Secondary | ICD-10-CM | POA: Insufficient documentation

## 2018-09-05 DIAGNOSIS — Z91040 Latex allergy status: Secondary | ICD-10-CM | POA: Insufficient documentation

## 2018-09-05 DIAGNOSIS — Z79899 Other long term (current) drug therapy: Secondary | ICD-10-CM | POA: Insufficient documentation

## 2018-09-05 DIAGNOSIS — J45909 Unspecified asthma, uncomplicated: Secondary | ICD-10-CM | POA: Diagnosis not present

## 2018-09-05 LAB — BASIC METABOLIC PANEL
Anion gap: 8 (ref 5–15)
BUN: 18 mg/dL (ref 6–20)
CALCIUM: 9.3 mg/dL (ref 8.9–10.3)
CO2: 24 mmol/L (ref 22–32)
Chloride: 106 mmol/L (ref 98–111)
Creatinine, Ser: 1 mg/dL (ref 0.44–1.00)
Glucose, Bld: 103 mg/dL — ABNORMAL HIGH (ref 70–99)
Potassium: 3.3 mmol/L — ABNORMAL LOW (ref 3.5–5.1)
SODIUM: 138 mmol/L (ref 135–145)

## 2018-09-05 LAB — CBC WITH DIFFERENTIAL/PLATELET
Abs Immature Granulocytes: 0.03 10*3/uL (ref 0.00–0.07)
Basophils Absolute: 0 10*3/uL (ref 0.0–0.1)
Basophils Relative: 1 %
EOS PCT: 3 %
Eosinophils Absolute: 0.2 10*3/uL (ref 0.0–0.5)
HCT: 41.8 % (ref 36.0–46.0)
Hemoglobin: 14 g/dL (ref 12.0–15.0)
Immature Granulocytes: 0 %
LYMPHS PCT: 30 %
Lymphs Abs: 2.1 10*3/uL (ref 0.7–4.0)
MCH: 28.2 pg (ref 26.0–34.0)
MCHC: 33.5 g/dL (ref 30.0–36.0)
MCV: 84.3 fL (ref 80.0–100.0)
MONO ABS: 0.5 10*3/uL (ref 0.1–1.0)
Monocytes Relative: 7 %
NEUTROS ABS: 4.1 10*3/uL (ref 1.7–7.7)
Neutrophils Relative %: 59 %
PLATELETS: 226 10*3/uL (ref 150–400)
RBC: 4.96 MIL/uL (ref 3.87–5.11)
RDW: 15.2 % (ref 11.5–15.5)
WBC: 7 10*3/uL (ref 4.0–10.5)
nRBC: 0 % (ref 0.0–0.2)

## 2018-09-05 LAB — I-STAT TROPONIN, ED: Troponin i, poc: 0.02 ng/mL (ref 0.00–0.08)

## 2018-09-05 MED ORDER — METOCLOPRAMIDE HCL 5 MG/ML IJ SOLN
10.0000 mg | INTRAMUSCULAR | Status: AC
  Administered 2018-09-05: 10 mg via INTRAVENOUS
  Filled 2018-09-05: qty 2

## 2018-09-05 MED ORDER — SODIUM CHLORIDE 0.9 % IV BOLUS
500.0000 mL | Freq: Once | INTRAVENOUS | Status: AC
  Administered 2018-09-05: 500 mL via INTRAVENOUS

## 2018-09-05 MED ORDER — FENTANYL CITRATE (PF) 100 MCG/2ML IJ SOLN
50.0000 ug | Freq: Once | INTRAMUSCULAR | Status: AC
  Administered 2018-09-05: 50 ug via INTRAVENOUS
  Filled 2018-09-05: qty 2

## 2018-09-05 NOTE — ED Triage Notes (Signed)
Pt brought in by [facility] from home. Pt complains of centralized chest pain and hypertension since earlier today. Pt has a history of high BP and has had 4 previous MI's. Pt took 324 mg aspirin and 1 nitro prior to [facility] arrival with no relief. Pt given another nitro in route. No change in CP. BP was 164/134. Pt went for a recent sleep apnea test and was told she coughed many times at night. Pt is not sure if this is another MI or pneumonia.  HR 70, RR 16

## 2018-09-05 NOTE — ED Provider Notes (Signed)
[facility] EMERGENCY DEPARTMENT Provider Note   CSN: [id number] Arrival date & time: 09/05/18  2029     History   Chief Complaint Chief Complaint  Patient presents with  . Chest Pain    HPI Tracey Manning is a 57 y.o. female.  57 year old female with a history of hypertension, hyperlipidemia, sleep apnea, tobacco abuse, CAD s/p stent x 2 who presents to the emergency department for evaluation of chest pain.  States that chest pain began around 1700 today.  She describes a sharp pain initially, but states that pain has become more aching in nature.  It is worse with coughing and deep breathing.  She took 1 324 mg aspirin and 1 sublingual nitroglycerin prior to EMS arrival with no relief of her symptoms.  She states that she did have some relief of her chest pain following second nitroglycerin given in route.  Has been compliant with all of her home medications including her Plavix.  She did stop this briefly 1 week ago for her sleep study.  Has been experiencing a dry, nonproductive cough recently.  Denies any fevers, syncope, nausea, diaphoresis, leg swelling, vomiting associated with her chest pain today.  Did have some dizziness earlier in the day which has resolved.  Is also presently complaining of a left-sided headache consistent with prior migraines since using nitroglycerin.  Cardiologist - Dr. Gwenlyn Found  The history is provided by the patient. No language interpreter was used.  Chest Pain      Past Medical History:  Diagnosis Date  . Anxiety   . Arthritis   . Asthma   . Chest pain   . Collagen vascular disease ([place])   . Coronary artery disease   . Depression   . Dyslipidemia   . GERD (gastroesophageal reflux disease)    occ  . Hypertension   . Myocardial infarct ([place]) 5974,1638  . Sciatica   . Sleep apnea    no CPAP ordered but using oxygen at bedtime  . Tobacco abuse     Patient Active Problem List   Diagnosis Date Noted  . Abnormal uterine  bleeding (AUB) 04/03/2016  . Chest pain syndrome 11/14/2015  . Dysfunctional uterine bleeding 11/14/2015  . Right sided abdominal pain 11/14/2015  . Right anterior shoulder pain 11/14/2015  . Pelvic pain in female 11/14/2015  . Bacterial vaginosis (recurrent) 11/14/2015  . Chest pain 11/14/2015  . Essential hypertension 04/04/2014  . Hyperlipidemia 04/04/2014  . Coronary artery disease 04/04/2014    Past Surgical History:  Procedure Laterality Date  . CARDIAC CATHETERIZATION  10/09/2003   [place], [place]) - LAD with 30% prox narrowing, 50% stenosis in mid-portion of PLA; RCA with 40% narrowing proximally ([name], [name])  . CARDIAC CATHETERIZATION  10/10/2007   70% stenosis in first septal perforator branch of LAD, 60-70% narrowing in mid LAD, 20% narrowing in mid AV groove Cfx with 80% diffuse narrowing in small distal marginal, total occlusion of mid RCA with L to R collaterals, 90% stenosis diffusely in prox branch of RCA followed by 70% stenosis in secondary curve & 80% stenosis in small marginal branch ([name])  . CARDIAC CATHETERIZATION  05/28/2008   normal L main, RCA with 100% prox lesion w/distal filling from LAD collaterals, LAD with 20% prox tubular lesion/ 40% mid LAD lesion/previous stent patent ([name])  . CARDIAC CATHETERIZATION  09/15/2009   discrete 100% osital RCA lesion, 50% prox LAD lesion, non-obstructive disease in all coronaries (Dr.  H. Elisabeth Cara)  . CESAREAN SECTION    . COLONOSCOPY    . COLONOSCOPY W/ POLYPECTOMY    . CORONARY ANGIOPLASTY WITH STENT PLACEMENT  10/31/2007   PCI of distal Cfx with 2.25x78mm Taxus Adam DES, 60% narrowing of mid LAD ([name])  . CORONARY ANGIOPLASTY WITH STENT PLACEMENT  06/11/2011   PCI of prox-mid LAD with 3x101mm DES Resolute ([name])  . DILATION AND CURETTAGE, DIAGNOSTIC / THERAPEUTIC    . DILITATION & CURRETTAGE/HYSTROSCOPY WITH HYDROTHERMAL ABLATION N/A 04/03/2016   Procedure:  DILATATION & CURETTAGE/HYSTEROSCOPY WITH HYDROTHERMAL ABLATION;  Surgeon: [name], MD;  Location: [facility];  Service: Gynecology;  Laterality: N/A;  . NM MYOCAR PERF WALL MOTION  08/2009   persantine myoview - normal perfusion in all regions, perfusion defect in anterior region (breast attenuation), EF 52%, low risk scan     OB History    Gravida  8   Para      Term      Preterm      AB  6   Living  2     SAB  6   TAB      Ectopic      Multiple      Live Births  2            Home Medications    Prior to Admission medications   Medication Sig Start Date End Date Taking? Authorizing Provider  albuterol (PROVENTIL HFA;VENTOLIN HFA) 108 (90 BASE) MCG/ACT inhaler Inhale 2 puffs into the lungs every 6 (six) hours as needed for wheezing or shortness of breath.    [provider]  amLODipine (NORVASC) 10 MG tablet Take 10 mg by mouth daily.    [provider]  aspirin EC 325 MG tablet Take 325 mg by mouth daily.    [provider]  atorvastatin (LIPITOR) 40 MG tablet Take 1 tablet (40 mg total) by mouth daily. 01/04/14   [name], MD  clopidogrel (PLAVIX) 75 MG tablet Take 1 tablet (75 mg total) by mouth daily with breakfast. 01/14/18   [name], MD  dicyclomine (BENTYL) 20 MG tablet Take 1 tablet (20 mg total) by mouth 3 (three) times daily before meals. 05/04/18   Carlisle Cater, PA-C  fluticasone-salmeterol (ADVAIR HFA) 761-60 MCG/ACT inhaler Inhale 2 puffs into the lungs 2 (two) times daily. Patient taking differently: Inhale 2 puffs into the lungs 2 (two) times daily as needed.  01/04/14   [name], MD  lisinopril-hydrochlorothiazide (PRINZIDE,ZESTORETIC) 20-12.5 MG per tablet Take 2 tablets by mouth daily.    [provider]  nebivolol (BYSTOLIC) 10 MG tablet Take 1 tablet by mouth daily.    [provider]  nitroGLYCERIN (NITROSTAT) 0.4 MG SL tablet Place 1 tablet (0.4 mg total) under the tongue  every 5 (five) minutes as needed for chest pain. 01/14/18   [name], MD  pantoprazole (PROTONIX) 20 MG tablet Take 2 tablets (40 mg total) by mouth 2 (two) times daily. 04/27/18   Armbruster, Carlota Raspberry, MD  sertraline (ZOLOFT) 50 MG tablet Take 50 mg by mouth daily.    [provider]  zolpidem (AMBIEN) 10 MG tablet Take 10 mg by mouth at bedtime as needed for sleep.    [provider]    Family History Family History  Problem Relation Age of Onset  . Leukemia Mother   . Clotting disorder Father        blood clot  .  Hypertension Sister   . Diabetes Sister   . Stroke Sister 64  . Bleeding Disorder Son        ITP "free bleeding disorder"  . Colon cancer Paternal Grandmother   . Diabetes Paternal Grandmother   . Stomach cancer Neg Hx   . Pancreatic cancer Neg Hx     Social History Social History   Tobacco Use  . Smoking status: Current Every Day Smoker    Packs/day: 0.50    Years: 0.00    Pack years: 0.00    Types: Cigarettes  . Smokeless tobacco: Never Used  Substance Use Topics  . Alcohol use: No    Alcohol/week: 0.0 standard drinks    Comment: seldom  . Drug use: Yes    Types: Marijuana    Comment: used last night     Allergies   Other; Amoxicillin; Dilaudid [hydromorphone hcl]; Ibuprofen; and Latex   Review of Systems Review of Systems  Cardiovascular: Positive for chest pain.   Ten systems reviewed and are negative for acute change, except as noted in the HPI.    Physical Exam Updated Vital Signs BP (!) 178/102   Pulse 64   Resp 14   Ht 5\' 1"  (1.549 m)   Wt 93.4 kg   LMP  (LMP Unknown) Comment: verified BEFORE imaging  SpO2 100%   BMI 38.92 kg/m   Physical Exam  Constitutional: She is oriented to person, place, and time. She appears well-developed and well-nourished. No distress.  Nontoxic appearing and in NAD  HENT:  Head: Normocephalic and atraumatic.  Eyes: Conjunctivae and EOM are normal. No scleral icterus.  Neck:  Normal range of motion.  Cardiovascular: Normal rate, regular rhythm and intact distal pulses.  Pulmonary/Chest: Effort normal. No stridor. No respiratory distress. She has no wheezes. She has no rales. She exhibits tenderness (sternal region).  Lungs CTAB. Respirations even and unlabored.  Musculoskeletal: Normal range of motion.  Neurological: She is alert and oriented to person, place, and time. She exhibits normal muscle tone. Coordination normal.  GCS 15. Patient moving all extremities spontaneously.  Skin: Skin is warm and dry. No rash noted. She is not diaphoretic. No erythema. No pallor.  Psychiatric: She has a normal mood and affect. Her behavior is normal.  Nursing note and vitals reviewed.    ED Treatments / Results  Labs (all labs ordered are listed, but only abnormal results are displayed) Labs Reviewed  BASIC METABOLIC PANEL - Abnormal; Notable for the following components:      Result Value   Potassium 3.3 (*)    Glucose, Bld 103 (*)    All other components within normal limits  CBC WITH DIFFERENTIAL/PLATELET  I-STAT TROPONIN, ED  I-STAT TROPONIN, ED    EKG EKG Interpretation  Date/Time:  Monday September 05 2018 20:49:40 EDT Ventricular Rate:  79 PR Interval:    QRS Duration: 107 QT Interval:  437 QTC Calculation: 501 R Axis:   19 Text Interpretation:  Sinus rhythm Minimal ST depression, lateral leads Borderline prolonged QT interval No significant change since last tracing Confirmed by Gareth Morgan [phone]) on 09/05/2018 9:16:35 PM   Radiology Dg Chest 2 View  Result Date: 09/05/2018 CLINICAL DATA:  Centralized chest pain and hypertension since earlier today. EXAM: CHEST - 2 VIEW COMPARISON:  12/26/2017 FINDINGS: Mild cardiac enlargement. No vascular congestion, edema, or consolidation. No blunting of costophrenic angles. No pneumothorax. Mediastinal contours appear intact. Degenerative changes in the spine. IMPRESSION: Mild cardiac enlargement. No  evidence of  active pulmonary disease. Electronically Signed   By: Lucienne Capers M.D.   On: 09/05/2018 22:46    Procedures Procedures (including critical care time)  Medications Ordered in ED Medications  morphine 4 MG/ML injection 4 mg (has no administration in time range)  metoCLOPramide (REGLAN) injection 10 mg (10 mg Intravenous Given 09/05/18 2226)  fentaNYL (SUBLIMAZE) injection 50 mcg (50 mcg Intravenous Given 09/05/18 2226)  sodium chloride 0.9 % bolus 500 mL (0 mLs Intravenous Stopped 09/06/18 0018)    10:30 PM Heart score is 4 presuming negative troponin given age, risk factors, nonspecific EKG changes. Pending labs at this time. Question MSK component given TTP to chest wall, reproducing patient's symptoms.  12:20 AM Patient seen by MD Ward.  Agrees with likely musculoskeletal etiology.  Patient continues to complain of headache.  Requesting morphine given no improvement following Reglan and fentanyl.  This has been ordered.  1:10 AM Approached by RN as patient expressing desire to leave the emergency department.  I went to speak with the patient who states that she is upset by the fact that she needed to use the bathroom and called out multiple times with no response from staff.  Complains that her head is still hurting her and that "told you only morphine works for me".  Offered reassurance to the patient that morphine was ordered for symptomatic management.  She continues to decline this medication, stating that she wants to go home.  I explained to the patient her pending troponin level at 1:30 AM.  She does not want to wait for this.  I have discussed with patient that her discharge from the department would be [place].  Explained that we are unable to rule out any significant life-threatening process with an incomplete work-up.  Patient states that she assumes the risk of leaving, acknowledging that this decision may be fatal.  She states that she will sign AMA  papers to this effect.  The patient has a capacity to make this decision.  AMA papers completed between patient and RN.   Initial Impression / Assessment and Plan / ED Course  I have reviewed the triage vital signs and the nursing notes.  Pertinent labs & imaging results that were available during my care of the patient were reviewed by me and considered in my medical decision making (see chart for details).     57 year old female presents to the emergency department for evaluation of chest pain.  Her pain most consistent with musculoskeletal etiology.  Her initial cardiac work-up has been reassuring, but a delta troponin was ordered given history and heart score of 4.  Patient decided to leave the hospital [place] prior to completion of work-up.  She was encouraged to follow-up with her primary care doctor as well as her cardiologist.  Patient seen ambulating out of the department in stable condition.   Final Clinical Impressions(s) / ED Diagnoses   Final diagnoses:  Nonspecific chest pain    ED Discharge Orders    None       Antonietta Breach, PA-C 09/06/18 0116    Ward, Delice Bison, DO 09/06/18 [phone]

## 2018-09-06 MED ORDER — MORPHINE SULFATE (PF) 4 MG/ML IV SOLN
4.0000 mg | Freq: Once | INTRAVENOUS | Status: DC
  Filled 2018-09-06: qty 1

## 2018-09-06 NOTE — ED Notes (Signed)
Wasted 4mg  Morphine in sharps with Maudie Mercury, Therapist, sports.

## 2018-09-06 NOTE — ED Notes (Signed)
Patient verbalizes understanding of medications and discharge instructions. No further questions at this time. Patient also verbalizes understanding of leaving against medical advice and states she will follow up with her PCP tomorrow.

## 2018-09-06 NOTE — ED Notes (Signed)
This is RN went to medicate patient who was sitting on the side of the bed, dressed, with all leads/BP cuff laying on the floor. Patient states "I want this IV out and my discharge papers so I can go home". Patient informed I have pain meds to give and another lab to get. She refused both at this time.    PA Claiborne Billings made aware.

## 2018-09-06 NOTE — ED Notes (Signed)
Patient refused vital sign check before discharge, states "I know my BP is still high because I'm upset and I want to leave".

## 2018-09-06 NOTE — ED Provider Notes (Signed)
Medical screening examination/treatment/procedure(s) were conducted as a shared visit with non-physician practitioner(s) and myself.  I personally evaluated the patient during the encounter.  EKG Interpretation  Date/Time:  Monday September 05 2018 20:49:40 EDT Ventricular Rate:  79 PR Interval:    QRS Duration: 107 QT Interval:  437 QTC Calculation: 501 R Axis:   19 Text Interpretation:  Sinus rhythm Minimal ST depression, lateral leads Borderline prolonged QT interval No significant change since last tracing Confirmed by Gareth Morgan [phone]) on 09/05/2018 9:16:35 PM  Patient is a 57 year old female with history of CAD who presents to the emergency department with atypical chest pain.  Seems to be musculoskeletal in nature.  Reproducible with palpation.  Has had 2 negative troponins, clear chest x-ray, no EKG changes.  Patient will follow-up with her outpatient provider.   Jerae Izard, Delice Bison, DO 09/06/18 0100

## 2018-09-06 NOTE — Discharge Instructions (Signed)
You have chosen to leave the emergency department [place].  You stressed understanding that your work-up in the emergency department was not complete.  Your initial heart evaluation has been reassuring.  We recommend follow-up with your cardiologist regarding your ongoing symptoms.

## 2018-09-06 NOTE — ED Notes (Signed)
Patient ambulated to the bathroom without assistance.

## 2018-09-09 DIAGNOSIS — G4730 Sleep apnea, unspecified: Secondary | ICD-10-CM | POA: Diagnosis not present

## 2018-09-09 NOTE — Procedures (Signed)
Patient Name: Tracey Manning, Tracey Manning Date: 08/29/2018 Gender: Female D.O.B: [birth year] Age (years): 24 Referring Provider: Nolene Ebbs Height (inches): 61 Interpreting Physician: Baird Lyons MD, ABSM Weight (lbs): 206 RPSGT: Carolin Coy BMI: 39 MRN: [id number] Neck Size: 15.00  CLINICAL INFORMATION Sleep Study Type: Split Night CPAP Indication for sleep study: Hypertension, Obesity, Snoring Epworth Sleepiness Score: 7  SLEEP STUDY TECHNIQUE As per the AASM Manual for the Scoring of Sleep and Associated Events v2.3 (April 2016) with a hypopnea requiring 4% desaturations.  The channels recorded and monitored were frontal, central and occipital EEG, electrooculogram (EOG), submentalis EMG (chin), nasal and oral airflow, thoracic and abdominal wall motion, anterior tibialis EMG, snore microphone, electrocardiogram, and pulse oximetry. Continuous positive airway pressure (CPAP) was initiated when the patient met split night criteria and was titrated according to treat sleep-disordered breathing.  MEDICATIONS Medications self-administered by patient taken the night of the study : AMBIEN, ADVAIR DISKUS  RESPIRATORY PARAMETERS Diagnostic  Total AHI (/hr): 21.8 RDI (/hr): 48.8 OA Index (/hr): 0.7 CA Index (/hr): 0.0 REM AHI (/hr): N/A NREM AHI (/hr): 21.8 Supine AHI (/hr): 24.3 Non-supine AHI (/hr): 18.3 Min O2 Sat (%): 88.0 Mean O2 (%): 94.8 Time below 88% (min): 0.2   Titration  Optimal Pressure (cm): 19 AHI at Optimal Pressure (/hr): 0.0 Min O2 at Optimal Pressure (%): 94.0 Supine % at Optimal (%): 0 Sleep % at Optimal (%): 100   SLEEP ARCHITECTURE The recording time for the entire night was 447.2 minutes.  During a baseline period of 219.7 minutes, the patient slept for 170.8 minutes in REM and nonREM, yielding a sleep efficiency of 77.7%%. Sleep onset after lights out was 5.9 minutes with a REM latency of N/A minutes. The patient spent 25.2%% of the night in stage N1  sleep, 74.8%% in stage N2 sleep, 0.0%% in stage N3 and 0% in REM.  During the titration period of 225.4 minutes, the patient slept for 210.8 minutes in REM and nonREM, yielding a sleep efficiency of 93.5%%. Sleep onset after CPAP initiation was 2.1 minutes with a REM latency of 29.5 minutes. The patient spent 6.6%% of the night in stage N1 sleep, 62.3%% in stage N2 sleep, 0.5%% in stage N3 and 30.6% in REM.  CARDIAC DATA The 2 lead EKG demonstrated sinus rhythm. The mean heart rate was 100.0 beats per minute. Other EKG findings include: PVCs.  LEG MOVEMENT DATA The total Periodic Limb Movements of Sleep (PLMS) were 0. The PLMS index was 0.0   IMPRESSIONS - Moderate obstructive sleep apnea occurred during the diagnostic portion of the study(AHI = 21.8/hour). An optimal PAP pressure was selected for this patient ( 19 cm of water) - No significant central sleep apnea occurred during the diagnostic portion of the study (CAI = 0.0/hour). - Moderate oxygen desaturation was noted during the diagnostic portion of the study (Min O2 =88.0%). Min sat at CPAP 19 was 94%. - The patient snored with moderate snoring volume during the diagnostic portion of the study. - EKG findings include PVCs, PACs. - Clinically significant periodic limb movements did not occur during sleep.  DIAGNOSIS - Obstructive Sleep Apnea (327.23 [G47.33 ICD-10])  RECOMMENDATIONS - Trial of CPAP therapy on 19 cm H2O or DME autopap 10-20. Patient used a Small size Resmed Full Face Mask AirFit F10 mask and heated humidification. - Be careful with alcohol, sedatives and other CNS depressants that may worsen sleep apnea and disrupt normal sleep architecture. - Sleep hygiene should be reviewed to assess factors  that may improve sleep quality. - Weight management and regular exercise should be initiated or continued.  [Electronically signed] 09/09/2018 03:15 PM  Baird Lyons MD, [place], American Board of Sleep  Medicine   NPI: [id number]                         [place], [place] [facility]  ELECTRONICALLY SIGNED ON:  09/09/2018, 3:12 PM [place] PH: [phone]   FX: [phone] [place]

## 2019-01-02 ENCOUNTER — Emergency Department (HOSPITAL_COMMUNITY)
Admission: EM | Admit: 2019-01-02 | Discharge: 2019-01-02 | Disposition: A | Discharge status: Home / Self Care | Payer: Medicaid Other | Attending: Emergency Medicine | Admitting: Emergency Medicine

## 2019-01-02 ENCOUNTER — Encounter (HOSPITAL_COMMUNITY): Payer: Self-pay | Admitting: Emergency Medicine

## 2019-01-02 DIAGNOSIS — I10 Essential (primary) hypertension: Secondary | ICD-10-CM | POA: Diagnosis not present

## 2019-01-02 DIAGNOSIS — Y999 Unspecified external cause status: Secondary | ICD-10-CM | POA: Diagnosis not present

## 2019-01-02 DIAGNOSIS — Z79899 Other long term (current) drug therapy: Secondary | ICD-10-CM | POA: Diagnosis not present

## 2019-01-02 DIAGNOSIS — Z7982 Long term (current) use of aspirin: Secondary | ICD-10-CM | POA: Diagnosis not present

## 2019-01-02 DIAGNOSIS — Z91040 Latex allergy status: Secondary | ICD-10-CM | POA: Diagnosis not present

## 2019-01-02 DIAGNOSIS — Y92003 Bedroom of unspecified non-institutional (private) residence as the place of occurrence of the external cause: Secondary | ICD-10-CM | POA: Insufficient documentation

## 2019-01-02 DIAGNOSIS — S161XXA Strain of muscle, fascia and tendon at neck level, initial encounter: Secondary | ICD-10-CM | POA: Diagnosis not present

## 2019-01-02 DIAGNOSIS — Y9389 Activity, other specified: Secondary | ICD-10-CM | POA: Insufficient documentation

## 2019-01-02 DIAGNOSIS — Y33XXXA Other specified events, undetermined intent, initial encounter: Secondary | ICD-10-CM | POA: Diagnosis not present

## 2019-01-02 DIAGNOSIS — F17210 Nicotine dependence, cigarettes, uncomplicated: Secondary | ICD-10-CM | POA: Insufficient documentation

## 2019-01-02 DIAGNOSIS — I2510 Atherosclerotic heart disease of native coronary artery without angina pectoris: Secondary | ICD-10-CM | POA: Diagnosis not present

## 2019-01-02 DIAGNOSIS — J45909 Unspecified asthma, uncomplicated: Secondary | ICD-10-CM | POA: Insufficient documentation

## 2019-01-02 DIAGNOSIS — Z7902 Long term (current) use of antithrombotics/antiplatelets: Secondary | ICD-10-CM | POA: Diagnosis not present

## 2019-01-02 DIAGNOSIS — S199XXA Unspecified injury of neck, initial encounter: Secondary | ICD-10-CM | POA: Diagnosis present

## 2019-01-02 MED ORDER — METHOCARBAMOL 500 MG PO TABS
500.0000 mg | ORAL_TABLET | Freq: Once | ORAL | Status: AC
  Administered 2019-01-02: 500 mg via ORAL
  Filled 2019-01-02: qty 1

## 2019-01-02 MED ORDER — METHOCARBAMOL 500 MG PO TABS: 500.0000 mg | ORAL_TABLET | Freq: Two times a day (BID) | ORAL | 0 refills | Status: DC

## 2019-01-02 NOTE — ED Provider Notes (Signed)
[facility] EMERGENCY DEPARTMENT Provider Note   CSN: [id number] Arrival date & time: 01/02/19  5361   History   Chief Complaint Chief Complaint  Patient presents with  . Neck Pain    HPI Tracey Manning is a 58 y.o. female.  HPI   58 year old female presents today with complaints of left lateral neck pain.  Patient notes symptoms started approximately 4 days ago after waking up.  She notes the run from the base of the skull down to the trapezius.  She notes symptoms are worse with looking to the left or palpation.  Patient notes she has chronic pain and takes oxycodone at home.  She reports that she took her last 2 pain pills this morning, but they were not improving her symptoms.  She denies any neurological deficits.  Denies any chest pain or shortness of breath.  Patient notes history of hypertension, notes that she took her blood pressure medication this morning.   Past Medical History:  Diagnosis Date  . Anxiety   . Arthritis   . Asthma   . Chest pain   . Collagen vascular disease ([place])   . Coronary artery disease   . Depression   . Dyslipidemia   . GERD (gastroesophageal reflux disease)    occ  . Hypertension   . Myocardial infarct ([place]) 4431,5400  . Sciatica   . Sleep apnea    no CPAP ordered but using oxygen at bedtime  . Tobacco abuse     Patient Active Problem List   Diagnosis Date Noted  . Abnormal uterine bleeding (AUB) 04/03/2016  . Chest pain syndrome 11/14/2015  . Dysfunctional uterine bleeding 11/14/2015  . Right sided abdominal pain 11/14/2015  . Right anterior shoulder pain 11/14/2015  . Pelvic pain in female 11/14/2015  . Bacterial vaginosis (recurrent) 11/14/2015  . Chest pain 11/14/2015  . Essential hypertension 04/04/2014  . Hyperlipidemia 04/04/2014  . Coronary artery disease 04/04/2014    Past Surgical History:  Procedure Laterality Date  . CARDIAC CATHETERIZATION  10/09/2003   [place], [place]) -  LAD with 30% prox narrowing, 50% stenosis in mid-portion of PLA; RCA with 40% narrowing proximally ([name], [name])  . CARDIAC CATHETERIZATION  10/10/2007   70% stenosis in first septal perforator branch of LAD, 60-70% narrowing in mid LAD, 20% narrowing in mid AV groove Cfx with 80% diffuse narrowing in small distal marginal, total occlusion of mid RCA with L to R collaterals, 90% stenosis diffusely in prox branch of RCA followed by 70% stenosis in secondary curve & 80% stenosis in small marginal branch ([name])  . CARDIAC CATHETERIZATION  05/28/2008   normal L main, RCA with 100% prox lesion w/distal filling from LAD collaterals, LAD with 20% prox tubular lesion/ 40% mid LAD lesion/previous stent patent ([name])  . CARDIAC CATHETERIZATION  09/15/2009   discrete 100% osital RCA lesion, 50% prox LAD lesion, non-obstructive disease in all coronaries ([name])  . CESAREAN SECTION    . COLONOSCOPY    . COLONOSCOPY W/ POLYPECTOMY    . CORONARY ANGIOPLASTY WITH STENT PLACEMENT  10/31/2007   PCI of distal Cfx with 2.25x16mm Taxus Adam DES, 60% narrowing of mid LAD ([name])  . CORONARY ANGIOPLASTY WITH STENT PLACEMENT  06/11/2011   PCI of prox-mid LAD with 3x24mm DES Resolute ([name])  . DILATION AND CURETTAGE, DIAGNOSTIC / THERAPEUTIC    . DILITATION & CURRETTAGE/HYSTROSCOPY WITH HYDROTHERMAL ABLATION N/A 04/03/2016  Procedure: DILATATION & CURETTAGE/HYSTEROSCOPY WITH HYDROTHERMAL ABLATION;  Surgeon: [name], MD;  Location: [facility];  Service: Gynecology;  Laterality: N/A;  . NM MYOCAR PERF WALL MOTION  08/2009   persantine myoview - normal perfusion in all regions, perfusion defect in anterior region (breast attenuation), EF 52%, low risk scan     OB History    Gravida  8   Para      Term      Preterm      AB  6   Living  2     SAB  6   TAB      Ectopic      Multiple      Live Births  2            Home Medications    Prior to  Admission medications   Medication Sig Start Date End Date Taking? Authorizing Provider  albuterol (PROVENTIL HFA;VENTOLIN HFA) 108 (90 BASE) MCG/ACT inhaler Inhale 2 puffs into the lungs every 6 (six) hours as needed for wheezing or shortness of breath.    [provider]  amLODipine (NORVASC) 10 MG tablet Take 10 mg by mouth daily.    [provider]  aspirin EC 325 MG tablet Take 325 mg by mouth daily.    [provider]  atorvastatin (LIPITOR) 40 MG tablet Take 1 tablet (40 mg total) by mouth daily. 01/04/14   [name], MD  clopidogrel (PLAVIX) 75 MG tablet Take 1 tablet (75 mg total) by mouth daily with breakfast. 01/14/18   [name], MD  dicyclomine (BENTYL) 20 MG tablet Take 1 tablet (20 mg total) by mouth 3 (three) times daily before meals. 05/04/18   Carlisle Cater, PA-C  fluticasone-salmeterol (ADVAIR HFA) 993-71 MCG/ACT inhaler Inhale 2 puffs into the lungs 2 (two) times daily. Patient taking differently: Inhale 2 puffs into the lungs 2 (two) times daily as needed.  01/04/14   [name], MD  lisinopril-hydrochlorothiazide (PRINZIDE,ZESTORETIC) 20-12.5 MG per tablet Take 2 tablets by mouth daily.    [provider]  methocarbamol (ROBAXIN) 500 MG tablet Take 1 tablet (500 mg total) by mouth 2 (two) times daily. 01/02/19   Dameer Speiser, Dellis Filbert, PA-C  nebivolol (BYSTOLIC) 10 MG tablet Take 1 tablet by mouth daily.    [provider]  nitroGLYCERIN (NITROSTAT) 0.4 MG SL tablet Place 1 tablet (0.4 mg total) under the tongue every 5 (five) minutes as needed for chest pain. 01/14/18   [name], MD  pantoprazole (PROTONIX) 20 MG tablet Take 2 tablets (40 mg total) by mouth 2 (two) times daily. 04/27/18   Armbruster, Carlota Raspberry, MD  sertraline (ZOLOFT) 50 MG tablet Take 50 mg by mouth daily.    [provider]  zolpidem (AMBIEN) 10 MG tablet Take 10 mg by mouth at bedtime as needed for sleep.    [provider]     Family History Family History  Problem Relation Age of Onset  . Leukemia Mother   . Clotting disorder Father        blood clot  . Hypertension Sister   . Diabetes Sister   . Stroke Sister 25  . Bleeding Disorder Son        ITP "free bleeding disorder"  . Colon cancer Paternal Grandmother   . Diabetes Paternal Grandmother   . Stomach cancer Neg Hx   . Pancreatic cancer Neg Hx     Social History Social History   Tobacco Use  .  Smoking status: Current Every Day Smoker    Packs/day: 0.50    Years: 0.00    Pack years: 0.00    Types: Cigarettes  . Smokeless tobacco: Never Used  Substance Use Topics  . Alcohol use: No    Alcohol/week: 0.0 standard drinks    Comment: seldom  . Drug use: Yes    Types: Marijuana    Comment: used last night     Allergies   Other; Amoxicillin; Dilaudid [hydromorphone hcl]; Ibuprofen; and Latex   Review of Systems Review of Systems  All other systems reviewed and are negative.    Physical Exam Updated Vital Signs BP (!) 184/120 (BP Location: Right Arm)   Pulse 87   Temp 98.2 F (36.8 C) (Oral)   Resp 16   LMP  (LMP Unknown) Comment: verified BEFORE imaging  SpO2 98%   Physical Exam Vitals signs and nursing note reviewed.  Constitutional:      Appearance: She is well-developed.  HENT:     Head: Normocephalic and atraumatic.  Eyes:     General: No scleral icterus.       Right eye: No discharge.        Left eye: No discharge.     Conjunctiva/sclera: Conjunctivae normal.     Pupils: Pupils are equal, round, and reactive to light.  Neck:     Musculoskeletal: Normal range of motion.     Vascular: No JVD.     Trachea: No tracheal deviation.  Pulmonary:     Effort: Pulmonary effort is normal.     Breath sounds: No stridor.  Musculoskeletal:     Comments: Tenderness palpation of the left lateral cervical musculature and trapezius, bilateral upper extremity sensation strength and motor function intact, no pain with range of  motion of the left arm-radial pulse 2+  Neurological:     Mental Status: She is alert and oriented to person, place, and time.     Coordination: Coordination normal.  Psychiatric:        Behavior: Behavior normal.        Thought Content: Thought content normal.        Judgment: Judgment normal.      ED Treatments / Results  Labs (all labs ordered are listed, but only abnormal results are displayed) Labs Reviewed - No data to display  EKG None  Radiology No results found.  Procedures Procedures (including critical care time)  Medications Ordered in ED Medications  methocarbamol (ROBAXIN) tablet 500 mg (500 mg Oral Given 01/02/19 0831)     Initial Impression / Assessment and Plan / ED Course  I have reviewed the triage vital signs and the nursing notes.  Pertinent labs & imaging results that were available during my care of the patient were reviewed by me and considered in my medical decision making (see chart for details).     58 year old female presents today with neck and shoulder pain.  This is muscular in nature, she has tenderness and some tightness in the trapezius.  Patient will be put on muscle relaxers.  She is a chronic pain patient and is seen by pain management.  She is instructed to follow-up with pain management for refill of her narcotics.  She is given return precautions.  She verbalized understanding and agreement to today's plan had no further questions or concerns at the time of discharge.  Final Clinical Impressions(s) / ED Diagnoses   Final diagnoses:  Strain of neck muscle, initial encounter    ED Discharge  Orders         Ordered    methocarbamol (ROBAXIN) 500 MG tablet  2 times daily     01/02/19 0834           Okey Regal, PA-C 01/02/19 0915    Malvin Johns, MD 01/02/19 [phone]

## 2019-01-02 NOTE — ED Triage Notes (Signed)
Pt reports neck pain that started on Thursday after waking up- pt reports she thought "maybe she slept on it wrong". Pt reports pain worse with trying to turn her head to the left.

## 2019-01-02 NOTE — Discharge Instructions (Addendum)
Please read attached information. If you experience any new or worsening signs or symptoms please return to the emergency room for evaluation. Please follow-up with your primary care provider or specialist as discussed. Please use medication prescribed only as directed and discontinue taking if you have any concerning signs or symptoms.   °

## 2019-01-16 ENCOUNTER — Emergency Department (HOSPITAL_COMMUNITY)
Admission: EM | Admit: 2019-01-16 | Discharge: 2019-01-17 | Disposition: A | Discharge status: Home / Self Care | Payer: Medicaid Other | Attending: Emergency Medicine | Admitting: Emergency Medicine

## 2019-01-16 ENCOUNTER — Other Ambulatory Visit: Payer: Self-pay

## 2019-01-16 ENCOUNTER — Emergency Department (HOSPITAL_COMMUNITY): Payer: Medicaid Other

## 2019-01-16 ENCOUNTER — Encounter (HOSPITAL_COMMUNITY): Payer: Self-pay | Admitting: *Deleted

## 2019-01-16 DIAGNOSIS — M47892 Other spondylosis, cervical region: Secondary | ICD-10-CM | POA: Insufficient documentation

## 2019-01-16 DIAGNOSIS — I259 Chronic ischemic heart disease, unspecified: Secondary | ICD-10-CM | POA: Insufficient documentation

## 2019-01-16 DIAGNOSIS — F17210 Nicotine dependence, cigarettes, uncomplicated: Secondary | ICD-10-CM | POA: Insufficient documentation

## 2019-01-16 DIAGNOSIS — Z79899 Other long term (current) drug therapy: Secondary | ICD-10-CM | POA: Insufficient documentation

## 2019-01-16 DIAGNOSIS — Z91040 Latex allergy status: Secondary | ICD-10-CM | POA: Insufficient documentation

## 2019-01-16 DIAGNOSIS — I10 Essential (primary) hypertension: Secondary | ICD-10-CM | POA: Insufficient documentation

## 2019-01-16 DIAGNOSIS — Z7902 Long term (current) use of antithrombotics/antiplatelets: Secondary | ICD-10-CM | POA: Diagnosis not present

## 2019-01-16 DIAGNOSIS — M47812 Spondylosis without myelopathy or radiculopathy, cervical region: Secondary | ICD-10-CM

## 2019-01-16 DIAGNOSIS — M542 Cervicalgia: Secondary | ICD-10-CM | POA: Diagnosis present

## 2019-01-16 DIAGNOSIS — Z7982 Long term (current) use of aspirin: Secondary | ICD-10-CM | POA: Insufficient documentation

## 2019-01-16 DIAGNOSIS — J45909 Unspecified asthma, uncomplicated: Secondary | ICD-10-CM | POA: Diagnosis not present

## 2019-01-16 LAB — CBC WITH DIFFERENTIAL/PLATELET
Abs Immature Granulocytes: 0.05 10*3/uL (ref 0.00–0.07)
BASOS ABS: 0 10*3/uL (ref 0.0–0.1)
Basophils Relative: 0 %
EOS PCT: 1 %
Eosinophils Absolute: 0.1 10*3/uL (ref 0.0–0.5)
HEMATOCRIT: 39.6 % (ref 36.0–46.0)
HEMOGLOBIN: 13.1 g/dL (ref 12.0–15.0)
Immature Granulocytes: 1 %
LYMPHS PCT: 18 %
Lymphs Abs: 1.8 10*3/uL (ref 0.7–4.0)
MCH: 28.8 pg (ref 26.0–34.0)
MCHC: 33.1 g/dL (ref 30.0–36.0)
MCV: 87 fL (ref 80.0–100.0)
Monocytes Absolute: 0.6 10*3/uL (ref 0.1–1.0)
Monocytes Relative: 6 %
NRBC: 0 % (ref 0.0–0.2)
Neutro Abs: 7.2 10*3/uL (ref 1.7–7.7)
Neutrophils Relative %: 74 %
Platelets: 219 10*3/uL (ref 150–400)
RBC: 4.55 MIL/uL (ref 3.87–5.11)
RDW: 16.4 % — ABNORMAL HIGH (ref 11.5–15.5)
WBC: 9.7 10*3/uL (ref 4.0–10.5)

## 2019-01-16 LAB — BASIC METABOLIC PANEL
ANION GAP: 9 (ref 5–15)
BUN: 17 mg/dL (ref 6–20)
CHLORIDE: 100 mmol/L (ref 98–111)
CO2: 27 mmol/L (ref 22–32)
CREATININE: 1.07 mg/dL — AB (ref 0.44–1.00)
Calcium: 9.1 mg/dL (ref 8.9–10.3)
GFR calc non Af Amer: 58 mL/min — ABNORMAL LOW (ref >60)
GLUCOSE: 102 mg/dL — AB (ref 70–99)
Potassium: 3.3 mmol/L — ABNORMAL LOW (ref 3.5–5.1)
Sodium: 136 mmol/L (ref 135–145)

## 2019-01-16 LAB — I-STAT TROPONIN, ED: Troponin i, poc: 0.03 ng/mL (ref 0.00–0.08)

## 2019-01-16 MED ORDER — KETOROLAC TROMETHAMINE 30 MG/ML IJ SOLN
30.0000 mg | Freq: Once | INTRAMUSCULAR | Status: AC
  Administered 2019-01-16: 30 mg via INTRAMUSCULAR
  Filled 2019-01-16: qty 1

## 2019-01-16 MED ORDER — SODIUM CHLORIDE 0.9 % IV SOLN: INTRAVENOUS | Status: DC

## 2019-01-16 MED ORDER — SODIUM CHLORIDE 0.9% FLUSH: 3.0000 mL | Freq: Once | INTRAVENOUS | Status: DC

## 2019-01-16 MED ORDER — DIAZEPAM 5 MG/ML IJ SOLN
5.0000 mg | Freq: Once | INTRAMUSCULAR | Status: AC
  Administered 2019-01-16: 5 mg via INTRAMUSCULAR
  Filled 2019-01-16: qty 2

## 2019-01-16 NOTE — ED Triage Notes (Addendum)
EMS brought pt in due to neck pain since Feb. Seen 2 days ago for same. States pain is getting worse. Pt hypertensive and has been taking her meds. Dystolic 975 checked in both arms

## 2019-01-16 NOTE — ED Provider Notes (Signed)
[facility] DEPT Provider Note   CSN: [id number] Arrival date & time: 01/16/19  2047    History   Chief Complaint Chief Complaint  Patient presents with  . Neck Pain    HPI Tracey Manning is a 58 y.o. female.     Patient presents to the ED with a chief complaint of acute on chronic left sided neck pain.  She see pain management, but has been unable to address this complaint with them.  She states that it started about 3 weeks ago and was seen 2 weeks ago for the same.  Was thought to be musculoskeletal and was discharged with Robaxin.  She states this didn't really help.  States that she was able to move her neck for about 2 days during the last 3 weeks and that the stiffness returned today.  She denies any fever, chills, or headache.  Denies any new numbness, weakness, or tingling of her extremities.  She has taken her pain medication with no relief.  Denies any trauma.  The history is provided by the patient. No language interpreter was used.    Past Medical History:  Diagnosis Date  . Anxiety   . Arthritis   . Asthma   . Chest pain   . Collagen vascular disease ([place])   . Coronary artery disease   . Depression   . Dyslipidemia   . GERD (gastroesophageal reflux disease)    occ  . Hypertension   . Myocardial infarct ([place]) 5462,7035  . Sciatica   . Sleep apnea    no CPAP ordered but using oxygen at bedtime  . Tobacco abuse     Patient Active Problem List   Diagnosis Date Noted  . Abnormal uterine bleeding (AUB) 04/03/2016  . Chest pain syndrome 11/14/2015  . Dysfunctional uterine bleeding 11/14/2015  . Right sided abdominal pain 11/14/2015  . Right anterior shoulder pain 11/14/2015  . Pelvic pain in female 11/14/2015  . Bacterial vaginosis (recurrent) 11/14/2015  . Chest pain 11/14/2015  . Essential hypertension 04/04/2014  . Hyperlipidemia 04/04/2014  . Coronary artery disease 04/04/2014    Past Surgical History:  Procedure  Laterality Date  . CARDIAC CATHETERIZATION  10/09/2003   [facility], [place]) - LAD with 30% prox narrowing, 50% stenosis in mid-portion of PLA; RCA with 40% narrowing proximally ([name], [name])  . CARDIAC CATHETERIZATION  10/10/2007   70% stenosis in first septal perforator branch of LAD, 60-70% narrowing in mid LAD, 20% narrowing in mid AV groove Cfx with 80% diffuse narrowing in small distal marginal, total occlusion of mid RCA with L to R collaterals, 90% stenosis diffusely in prox branch of RCA followed by 70% stenosis in secondary curve & 80% stenosis in small marginal branch ([name])  . CARDIAC CATHETERIZATION  05/28/2008   normal L main, RCA with 100% prox lesion w/distal filling from LAD collaterals, LAD with 20% prox tubular lesion/ 40% mid LAD lesion/previous stent patent ([name])  . CARDIAC CATHETERIZATION  09/15/2009   discrete 100% osital RCA lesion, 50% prox LAD lesion, non-obstructive disease in all coronaries ([name])  . CESAREAN SECTION    . COLONOSCOPY    . COLONOSCOPY W/ POLYPECTOMY    . CORONARY ANGIOPLASTY WITH STENT PLACEMENT  10/31/2007   PCI of distal Cfx with 2.25x82m Taxus Adam DES, 60% narrowing of mid LAD (Dr. TCorky Downs  . CORONARY ANGIOPLASTY WITH STENT PLACEMENT  06/11/2011   PCI of prox-mid LAD with  3x3m DES Resolute (Dr. TCorky Downs  . DILATION AND CURETTAGE, DIAGNOSTIC / THERAPEUTIC    . DILITATION & CURRETTAGE/HYSTROSCOPY WITH HYDROTHERMAL ABLATION N/A 04/03/2016   Procedure: DILATATION & CURETTAGE/HYSTEROSCOPY WITH HYDROTHERMAL ABLATION;  Surgeon: CShelly Bombard MD;  [facility];  Service: Gynecology;  Laterality: N/A;  . NM MYOCAR PERF WALL MOTION  08/2009   persantine myoview - normal perfusion in all regions, perfusion defect in anterior region (breast attenuation), EF 52%, low risk scan     OB History    Gravida  8   Para      Term      Preterm      AB  6   Living  2     SAB  6   TAB       Ectopic      Multiple      Live Births  2            Home Medications    Prior to Admission medications   Medication Sig Start Date End Date Taking? Authorizing Provider  albuterol (PROVENTIL HFA;VENTOLIN HFA) 108 (90 BASE) MCG/ACT inhaler Inhale 2 puffs into the lungs every 6 (six) hours as needed for wheezing or shortness of breath.   Yes [provider]  amLODipine (NORVASC) 10 MG tablet Take 10 mg by mouth daily.   Yes [provider]  aspirin EC 325 MG tablet Take 325 mg by mouth daily.   Yes [provider]  atorvastatin (LIPITOR) 40 MG tablet Take 1 tablet (40 mg total) by mouth daily. 01/04/14  Yes KHarden Mo MD  clopidogrel (PLAVIX) 75 MG tablet Take 1 tablet (75 mg total) by mouth daily with breakfast. 01/14/18  Yes BLorretta Harp MD  lisinopril-hydrochlorothiazide (PRINZIDE,ZESTORETIC) 20-12.5 MG per tablet Take 1 tablet by mouth 2 (two) times daily.    Yes [provider]  methocarbamol (ROBAXIN) 500 MG tablet Take 1 tablet (500 mg total) by mouth 2 (two) times daily. 01/02/19  Yes Hedges, JDellis Filbert PA-C  Nebivolol HCl (BYSTOLIC) 20 MG TABS Take 20 mg by mouth daily.    Yes [provider]  neomycin-polymyxin b-dexamethasone (MAXITROL) 3.5-10000-0.1 SUSP Place 1 drop into the left eye 2 (two) times daily. 09/09/18  Yes [provider]  Vitamin D, Ergocalciferol, (DRISDOL) 1.25 MG (50000 UT) CAPS capsule Take 50,000 Units by mouth once a week. 09/23/18  Yes [provider]  zolpidem (AMBIEN) 10 MG tablet Take 10 mg by mouth at bedtime as needed for sleep.   Yes [provider]  dicyclomine (BENTYL) 20 MG tablet Take 1 tablet (20 mg total) by mouth 3 (three) times daily before meals. Patient not taking: Reported on 01/16/2019 05/04/18   GCarlisle Cater PA-C  fluticasone-salmeterol (ADVAIR HFA) [phone]/ACT inhaler Inhale 2 puffs into the lungs 2 (two) times daily. Patient not taking: Reported on  01/16/2019 01/04/14   KHarden Mo MD  [facility]/0.1ML LIQD nasal spray kit Place 1 spray into the nose once.  09/01/18   [provider]  nitroGLYCERIN (NITROSTAT) 0.4 MG SL tablet Place 1 tablet (0.4 mg total) under the tongue every 5 (five) minutes as needed for chest pain. 01/14/18   BLorretta Harp MD  pantoprazole (PROTONIX) 20 MG tablet Take 2 tablets (40 mg total) by mouth 2 (two) times daily. Patient not taking: Reported on 01/16/2019 04/27/18   Armbruster, SCarlota Raspberry MD    Family History Family History  Problem Relation Age of Onset  .  Leukemia Mother   . Clotting disorder Father        blood clot  . Hypertension Sister   . Diabetes Sister   . Stroke Sister 53  . Bleeding Disorder Son        ITP "free bleeding disorder"  . Colon cancer Paternal Grandmother   . Diabetes Paternal Grandmother   . Stomach cancer Neg Hx   . Pancreatic cancer Neg Hx     Social History Social History   Tobacco Use  . Smoking status: Current Every Day Smoker    Packs/day: 0.50    Years: 0.00    Pack years: 0.00    Types: Cigarettes  . Smokeless tobacco: Never Used  Substance Use Topics  . Alcohol use: No    Alcohol/week: 0.0 standard drinks    Comment: seldom  . Drug use: Yes    Types: Marijuana    Comment: used last night     Allergies   Other; Amoxicillin; Dilaudid [hydromorphone hcl]; Ibuprofen; and Latex   Review of Systems Review of Systems  All other systems reviewed and are negative.    Physical Exam Updated Vital Signs BP (!) 180/124   Pulse 95   Temp 98.2 F (36.8 C) (Oral)   Resp (!) 22   Ht 5' 1"  (1.549 m)   Wt 92.1 kg   LMP  (LMP Unknown) Comment: verified BEFORE imaging  SpO2 98%   BMI 38.36 kg/m   Physical Exam Vitals signs and nursing note reviewed.  Constitutional:      Appearance: She is well-developed.  HENT:     Head: Normocephalic and atraumatic.     Right Ear: External ear normal.     Left Ear: External ear normal.  Eyes:      Conjunctiva/sclera: Conjunctivae normal.     Pupils: Pupils are equal, round, and reactive to light.  Neck:     Musculoskeletal: Normal range of motion and neck supple.     Comments: No pain with neck flexion, no meningismus Cardiovascular:     Rate and Rhythm: Normal rate and regular rhythm.     Heart sounds: Normal heart sounds. No murmur. No friction rub. No gallop.   Pulmonary:     Effort: Pulmonary effort is normal. No respiratory distress.     Breath sounds: Normal breath sounds. No wheezing or rales.  Chest:     Chest wall: No tenderness.  Abdominal:     General: There is no distension.     Palpations: Abdomen is soft. There is no mass.     Tenderness: There is no abdominal tenderness. There is no guarding or rebound.  Musculoskeletal: Normal range of motion.        General: No tenderness.     Comments: Left sided upper trapezius and cervical paraspinal muscle tenderness, pain out of proportion to exam  Skin:    General: Skin is warm and dry.     Comments: No redness, rash, or sign of infection or abscess  Neurological:     Mental Status: She is alert and oriented to person, place, and time.     Deep Tendon Reflexes: Reflexes are normal and symmetric.     Comments: CN 3-12 intact, moves all extremities, sensation and strength grossly intact  Psychiatric:        Behavior: Behavior normal.        Thought Content: Thought content normal.        Judgment: Judgment normal.      ED Treatments /  Results  Labs (all labs ordered are listed, but only abnormal results are displayed) Labs Reviewed  CBC WITH DIFFERENTIAL/PLATELET - Abnormal; Notable for the following components:      Result Value   RDW 16.4 (*)    All other components within normal limits  BASIC METABOLIC PANEL - Abnormal; Notable for the following components:   Potassium 3.3 (*)    Glucose, Bld 102 (*)    Creatinine, Ser 1.07 (*)    GFR calc non Af Amer 58 (*)    All other components within normal limits    I-STAT TROPONIN, ED    EKG None  Radiology Ct Cervical Spine Wo Contrast  Result Date: 01/17/2019 CLINICAL DATA:  58 y/o  F; left neck pain. EXAM: CT CERVICAL SPINE WITHOUT CONTRAST TECHNIQUE: Multidetector CT imaging of the cervical spine was performed without intravenous contrast. Multiplanar CT image reconstructions were also generated. COMPARISON:  01/26/2018 CT cervical myelogram. FINDINGS: Alignment: Mild reversal of cervical curvature with apex at C5, no listhesis. Skull base and vertebrae: No acute fracture. No primary bone lesion or focal pathologic process. Progressive left-sided C2-3 facet joint eburnation. Soft tissues and spinal canal: No prevertebral fluid or swelling. No visible canal hematoma. Disc levels: Mild discogenic degenerative changes greatest at the C5-C7 levels with loss of intervertebral disc space height and endplate marginal osteophytes. Uncovertebral and facet hypertrophy encroaches on the left C2-3, right C5-6, left C6-7 neural foramen. No high-grade bony spinal canal stenosis. Upper chest: Negative. Other: None. IMPRESSION: 1. Progressive left-sided C2-3 facet joint eburnation compatible with degenerative or inflammatory facet arthritis. 2. Mild-to-moderate discogenic degenerative changes greatest at C5-C7 levels. 3. No acute osseous abnormality identified. Electronically Signed   By: Kristine Garbe M.D.   On: 01/17/2019 00:00    Procedures Procedures (including critical care time)  Medications Ordered in ED Medications  ketorolac (TORADOL) 30 MG/ML injection 30 mg (30 mg Intramuscular Given 01/16/19 2232)  diazepam (VALIUM) injection 5 mg (5 mg Intramuscular Given 01/16/19 2232)     Initial Impression / Assessment and Plan / ED Course  I have reviewed the triage vital signs and the nursing notes.  Pertinent labs & imaging results that were available during my care of the patient were reviewed by me and considered in my medical decision making (see  chart for details).        Patient with complaints of severe left sided neck pain.  Seems to be musculoskeletal.  Easily reproducible with palpation, but pain out of proportion to exam findings.  I am concerned about secondary gain.  Will give toradol shot and valium shot.  Will check CT cervical spine.  CT shows progressive left-sided C2-3 facet joint eburnation compatible with degenerative or inflammatory facet arthritis. I discussed these results with the patient and have advised her to follow-up with her neurosurgeon Dr. Christella Noa.  Will try a prednisone taper.  Final Clinical Impressions(s) / ED Diagnoses   Final diagnoses:  Facet arthritis of cervical region    ED Discharge Orders         Ordered    predniSONE (DELTASONE) 20 MG tablet  Daily     01/17/19 0019           Montine Circle, PA-C 01/17/19 Assunta Found, MD 01/17/19 0025

## 2019-01-16 NOTE — ED Notes (Signed)
Pt requested something to drink while in lobby, pt told she needed to wait to see provider. Pt stated she would find something to drink if staff couldn't give her one.

## 2019-01-17 MED ORDER — PREDNISONE 20 MG PO TABS: 40.0000 mg | ORAL_TABLET | Freq: Every day | ORAL | 0 refills | Status: DC

## 2019-01-17 MED ORDER — PREDNISONE 20 MG PO TABS
60.0000 mg | ORAL_TABLET | Freq: Once | ORAL | Status: AC
  Administered 2019-01-17: 60 mg via ORAL
  Filled 2019-01-17: qty 3

## 2019-01-24 ENCOUNTER — Telehealth: Payer: Self-pay

## 2019-01-24 ENCOUNTER — Ambulatory Visit: Payer: Medicaid Other | Admitting: Gastroenterology

## 2019-01-24 ENCOUNTER — Encounter: Payer: Self-pay | Admitting: Gastroenterology

## 2019-01-24 VITALS — BP 180/110 | HR 100 | Ht 61.0 in | Wt 211.0 lb

## 2019-01-24 DIAGNOSIS — Z7902 Long term (current) use of antithrombotics/antiplatelets: Secondary | ICD-10-CM | POA: Diagnosis not present

## 2019-01-24 DIAGNOSIS — Z86010 Personal history of colonic polyps: Secondary | ICD-10-CM | POA: Diagnosis not present

## 2019-01-24 DIAGNOSIS — K3189 Other diseases of stomach and duodenum: Secondary | ICD-10-CM | POA: Diagnosis not present

## 2019-01-24 DIAGNOSIS — K2270 Barrett's esophagus without dysplasia: Secondary | ICD-10-CM

## 2019-01-24 DIAGNOSIS — K31A0 Gastric intestinal metaplasia, unspecified: Secondary | ICD-10-CM

## 2019-01-24 NOTE — Telephone Encounter (Signed)
[facility] Pre-operative Risk Assessment     Request for surgical clearance:     Endoscopy Procedure   PLEASE NOTE: PATIENT HAS AN APPT TOMORROW, 01-25-2019 WITH DR BERRY  What type of surgery is being performed?     Colonoscopy/Endoscopy  When is this surgery scheduled? next Wednesday, 02-01-2019  What type of clearance is required ?   Pharmacy  Are there any medications that need to be held prior to surgery and how long? Plavix 5 days (hold starting 3-6)  Practice name and name of physician performing surgery?   [facility]  What is your office phone and fax number?      Phone- [phone]  Fax- [phone]  Contact: Lemar Lofty, CMA  Anesthesia type (None, local, MAC, general) ?   MAC

## 2019-01-24 NOTE — Progress Notes (Signed)
HPI :  58 year old female here for follow-up visit.  She had an endoscopy EGD 06/16/2018 - 2cm HH, GEJ mild nodularity, mild gastritis, prominent duodenal fold - HP negative, biopsies (+) for GIM, biopsies positive for nondysplastic BE Colonoscopy 06/16/2018 - nl ileum, 11 polyps - one large and removed in piecemeal, fair prep   She is here to discuss having follow-up endoscopy in light of mild nodularity in segment of Barrett's esophagus on her last EGD, now that she is on higher dose PPI. Also in light of gastric intestinal metaplasia noted on biopsies. Further she had 11 adenomas removed on her last colonoscopy in July at which time the preparation was only fair. She continues to take Plavix for history of coronary artery disease. She is scheduled to see her cardiologist Dr. Gwenlyn Found tomorrow for routine follow-up. She denies any cardiopulmonary symptoms at this time.  She is taking Protonix 20 g twice a day. She denies any reflux symptoms or significant heartburn. She denies any dysphagia. She is eating pretty well. Her bowels are moving okay, she doesn't see any blood in her stools. Her abdomen does not appear to be bothering her too much at this time.  She has significant arthritis in her neck and back, she is seeing neurology and considering surgical therapy.  CT scan abdomen / pelvis 04/13/2018 - DJD L5-S1, diverticulosis, renal cyst, hepatic steatosis, CAD with borderline cardiomegaly. Normal gallbladder.  CT abdomen / pelvis 11/14/2015 - no acute findings  Past Medical History:  Diagnosis Date  . Anxiety   . Arthritis   . Asthma   . Barrett's esophagus   . Chest pain   . Collagen vascular disease ([place])   . Colon polyps   . Coronary artery disease   . Depression   . Dyslipidemia   . GERD (gastroesophageal reflux disease)    occ  . Hypertension   . Myocardial infarct ([place]) [id number]  . Sciatica   . Sleep apnea    no CPAP ordered but using oxygen at bedtime  . Tobacco abuse        Past Surgical History:  Procedure Laterality Date  . CARDIAC CATHETERIZATION  10/09/2003   [place], [place]) - LAD with 30% prox narrowing, 50% stenosis in mid-portion of PLA; RCA with 40% narrowing proximally ([name], [name])  . CARDIAC CATHETERIZATION  10/10/2007   70% stenosis in first septal perforator branch of LAD, 60-70% narrowing in mid LAD, 20% narrowing in mid AV groove Cfx with 80% diffuse narrowing in small distal marginal, total occlusion of mid RCA with L to R collaterals, 90% stenosis diffusely in prox branch of RCA followed by 70% stenosis in secondary curve & 80% stenosis in small marginal branch ([name])  . CARDIAC CATHETERIZATION  05/28/2008   normal L main, RCA with 100% prox lesion w/distal filling from LAD collaterals, LAD with 20% prox tubular lesion/ 40% mid LAD lesion/previous stent patent ([name])  . CARDIAC CATHETERIZATION  09/15/2009   discrete 100% osital RCA lesion, 50% prox LAD lesion, non-obstructive disease in all coronaries ([name])  . CESAREAN SECTION    . COLONOSCOPY    . COLONOSCOPY W/ POLYPECTOMY    . CORONARY ANGIOPLASTY WITH STENT PLACEMENT  10/31/2007   PCI of distal Cfx with 2.25x53m Taxus Adam DES, 60% narrowing of mid LAD (Dr. TCorky Downs  . CORONARY ANGIOPLASTY WITH STENT PLACEMENT  06/11/2011   PCI of prox-mid LAD with 3x179mDES Resolute (Dr. T.[name] .  DILATION AND CURETTAGE, DIAGNOSTIC / THERAPEUTIC    . DILITATION & CURRETTAGE/HYSTROSCOPY WITH HYDROTHERMAL ABLATION N/A 04/03/2016   Procedure: DILATATION & CURETTAGE/HYSTEROSCOPY WITH HYDROTHERMAL ABLATION;  Surgeon: [name], MD;  Location: [facility];  Service: Gynecology;  Laterality: N/A;  . NM MYOCAR PERF WALL MOTION  08/2009   persantine myoview - normal perfusion in all regions, perfusion defect in anterior region (breast attenuation), EF 52%, low risk scan   Family History  Problem Relation Age of Onset  . Leukemia Mother   . Clotting  disorder Father        blood clot  . Hypertension Sister   . Diabetes Sister   . Stroke Sister 29  . Bleeding Disorder Son        ITP "free bleeding disorder"  . Colon cancer Paternal Grandmother   . Diabetes Paternal Grandmother   . Stomach cancer Neg Hx   . Pancreatic cancer Neg Hx    Social History   Tobacco Use  . Smoking status: Current Every Day Smoker    Packs/day: 0.50    Years: 0.00    Pack years: 0.00    Types: Cigarettes  . Smokeless tobacco: Never Used  Substance Use Topics  . Alcohol use: No    Alcohol/week: 0.0 standard drinks    Comment: seldom  . Drug use: Yes    Types: Marijuana    Comment: used last night   Current Outpatient Medications  Medication Sig Dispense Refill  . albuterol (PROVENTIL HFA;VENTOLIN HFA) 108 (90 BASE) MCG/ACT inhaler Inhale 2 puffs into the lungs every 6 (six) hours as needed for wheezing or shortness of breath.    Marland Kitchen amLODipine (NORVASC) 10 MG tablet Take 10 mg by mouth daily.    Marland Kitchen aspirin EC 325 MG tablet Take 325 mg by mouth daily.    Marland Kitchen atorvastatin (LIPITOR) 40 MG tablet Take 1 tablet (40 mg total) by mouth daily. 30 tablet 2  . clopidogrel (PLAVIX) 75 MG tablet Take 1 tablet (75 mg total) by mouth daily with breakfast. 30 tablet 2  . dicyclomine (BENTYL) 20 MG tablet Take 1 tablet (20 mg total) by mouth 3 (three) times daily before meals. 90 tablet 2  . fluticasone-salmeterol (ADVAIR HFA) 115-21 MCG/ACT inhaler Inhale 2 puffs into the lungs 2 (two) times daily. 1 Inhaler 12  . lisinopril-hydrochlorothiazide (PRINZIDE,ZESTORETIC) 20-12.5 MG per tablet Take 1 tablet by mouth 2 (two) times daily.     . methocarbamol (ROBAXIN) 500 MG tablet Take 1 tablet (500 mg total) by mouth 2 (two) times daily. 20 tablet 0  . NARCAN 4 MG/0.1ML LIQD nasal spray kit Place 1 spray into the nose once.     . Nebivolol HCl (BYSTOLIC) 20 MG TABS Take 20 mg by mouth daily.     Marland Kitchen neomycin-polymyxin b-dexamethasone (MAXITROL) 3.5-10000-0.1 SUSP Place 1 drop  into the left eye 2 (two) times daily.    . nitroGLYCERIN (NITROSTAT) 0.4 MG SL tablet Place 1 tablet (0.4 mg total) under the tongue every 5 (five) minutes as needed for chest pain. 25 tablet 6  . pantoprazole (PROTONIX) 20 MG tablet Take 2 tablets (40 mg total) by mouth 2 (two) times daily. 30 tablet 0  . predniSONE (DELTASONE) 20 MG tablet Take 2 tablets (40 mg total) by mouth daily. Take 40 mg by mouth daily for 3 days, then 6m by mouth daily for 3 days, then 152mdaily for 3 days 12 tablet 0  . Vitamin D, Ergocalciferol, (DRISDOL) 1.25 MG (50000  UT) CAPS capsule Take 50,000 Units by mouth once a week.    . zolpidem (AMBIEN) 10 MG tablet Take 10 mg by mouth at bedtime as needed for sleep.     No current facility-administered medications for this visit.    Allergies  Allergen Reactions  . Other Anaphylaxis and Hives    Fresh strawberries (throat swelled)  . Amoxicillin Nausea And Vomiting    Did it involve swelling of the face/tongue/throat, SOB, or low BP? Yes Did it involve sudden or severe rash/hives, skin peeling, or any reaction on the inside of your mouth or nose? Yes Did you need to seek medical attention at a hospital or doctor's office? Yes When did it last happen?within last 10 years If all above answers are "NO", may proceed with cephalosporin use.   . Dilaudid [Hydromorphone Hcl] Nausea And Vomiting  . Ibuprofen Other (See Comments)    wheezing  . Latex Swelling    No reaction with bandaids     Review of Systems: All systems reviewed and negative except where noted in HPI.    Ct Cervical Spine Wo Contrast  Result Date: 01/17/2019 CLINICAL DATA:  58 y/o  F; left neck pain. EXAM: CT CERVICAL SPINE WITHOUT CONTRAST TECHNIQUE: Multidetector CT imaging of the cervical spine was performed without intravenous contrast. Multiplanar CT image reconstructions were also generated. COMPARISON:  01/26/2018 CT cervical myelogram. FINDINGS: Alignment: Mild reversal of  cervical curvature with apex at C5, no listhesis. Skull base and vertebrae: No acute fracture. No primary bone lesion or focal pathologic process. Progressive left-sided C2-3 facet joint eburnation. Soft tissues and spinal canal: No prevertebral fluid or swelling. No visible canal hematoma. Disc levels: Mild discogenic degenerative changes greatest at the C5-C7 levels with loss of intervertebral disc space height and endplate marginal osteophytes. Uncovertebral and facet hypertrophy encroaches on the left C2-3, right C5-6, left C6-7 neural foramen. No high-grade bony spinal canal stenosis. Upper chest: Negative. Other: None. IMPRESSION: 1. Progressive left-sided C2-3 facet joint eburnation compatible with degenerative or inflammatory facet arthritis. 2. Mild-to-moderate discogenic degenerative changes greatest at C5-C7 levels. 3. No acute osseous abnormality identified. Electronically Signed   By: Kristine Garbe M.D.   On: 01/17/2019 00:00   Lab Results  Component Value Date   WBC 9.7 01/16/2019   HGB 13.1 01/16/2019   HCT 39.6 01/16/2019   MCV 87.0 01/16/2019   PLT 219 01/16/2019    Lab Results  Component Value Date   CREATININE 1.07 (H) 01/16/2019   BUN 17 01/16/2019   NA 136 01/16/2019   K 3.3 (L) 01/16/2019   CL 100 01/16/2019   CO2 27 01/16/2019    Lab Results  Component Value Date   ALT 17 05/04/2018   AST 17 05/04/2018   ALKPHOS 62 05/04/2018   BILITOT 0.6 05/04/2018      Physical Exam: BP (!) 180/110   Pulse 100   Ht 5' 1"  (1.549 m)   Wt 211 lb (95.7 kg)   LMP  (LMP Unknown) Comment: verified BEFORE imaging  BMI 39.87 kg/m  Constitutional: Pleasant,well-developed, female in no acute distress. HEENT: Normocephalic and atraumatic. Conjunctivae are normal. No scleral icterus. Neck supple.  Cardiovascular: Normal rate, regular rhythm.  Pulmonary/chest: Effort normal and breath sounds normal. No wheezing, rales or rhonchi. Abdominal: Soft, nondistended,  nontender.  There are no masses palpable. No hepatomegaly. Extremities: no edema Lymphadenopathy: No cervical adenopathy noted. Neurological: Alert and oriented to person place and time. Skin: Skin is warm and dry. No  rashes noted. Psychiatric: Normal mood and affect. Behavior is normal.   ASSESSMENT AND PLAN: 58 y/o female here for reassessment of the following issues:  History of colon polyps / antiplatelet use - due for surveillance colonoscopy in light of numerous adenomas in the setting of a fair bowel prep. I discussed colonoscopy including risks and benefits of that and anesthesia with her and she wanted to proceed. We will recommend a 2 day bowel prep for her exam. She needs have clearance from her cardiologist to hold her Plavix for 5 days prior to the procedure. She agreed with the plan  Barrett's esophagus / GIM - short segment noted on upper endoscopy with faint mild nodularity in one focal area felt to be inflammatory. On higher dose PPI she has no symptoms. Will plan on re-evaluating the area with EGD while on higher dose PPI, to be done at same time as colonoscopy. Will consider additional biopsies for GIM. She agreed.  Traverse Cellar, MD [facility]

## 2019-01-24 NOTE — Patient Instructions (Addendum)
If you are age 58 or older, your body mass index should be between 23-30. Your Body mass index is 39.87 kg/m. If this is out of the aforementioned range listed, please consider follow up with your Primary Care Provider.  If you are age 69 or younger, your body mass index should be between 19-25. Your Body mass index is 39.87 kg/m. If this is out of the aformentioned range listed, please consider follow up with your Primary Care Provider.   You have been scheduled for an endoscopy and colonoscopy. Please follow the written instructions given to you at your visit today. Please pick up your prep supplies at the pharmacy within the next 1-3 days. If you use inhalers (even only as needed), please bring them with you on the day of your procedure. Your physician has requested that you go to www.startemmi.com and enter the access code given to you at your visit today. This web site gives a general overview about your procedure. However, you should still follow specific instructions given to you by our office regarding your preparation for the procedure.  You will be contacted by our office prior to your procedure for directions on holding your Plavix.  If you do not hear from our office 1 week prior to your scheduled procedure, please call [phone] to discuss.   We are giving you information about [employer] transportation service in case you need to utilize their services on the day of your procedure.  We are giving you a sample of Plenvu to use to prep for your colonoscopy/endoscopy.  Thank you for entrusting me with your care and for choosing [facility], Dr. [place] Cellar

## 2019-01-24 NOTE — Telephone Encounter (Signed)
Dr. Gwenlyn Found and [place]. This patient has an appointment with you on 01/25/19 Can you please address clearance for colonoscopy and holding plavix.

## 2019-01-25 ENCOUNTER — Encounter: Payer: Self-pay | Admitting: Cardiovascular Disease

## 2019-01-25 ENCOUNTER — Ambulatory Visit (INDEPENDENT_AMBULATORY_CARE_PROVIDER_SITE_OTHER): Payer: Medicaid Other | Admitting: Cardiovascular Disease

## 2019-01-25 DIAGNOSIS — I10 Essential (primary) hypertension: Secondary | ICD-10-CM | POA: Diagnosis not present

## 2019-01-25 DIAGNOSIS — I2510 Atherosclerotic heart disease of native coronary artery without angina pectoris: Secondary | ICD-10-CM | POA: Diagnosis not present

## 2019-01-25 DIAGNOSIS — E782 Mixed hyperlipidemia: Secondary | ICD-10-CM

## 2019-01-25 NOTE — Progress Notes (Signed)
01/25/2019 Tracey Manning   May 24, 1961  [id number]  Primary Physician Nolene Ebbs, MD Primary Cardiologist: [name] MD [name], [place]  HPI:  Tracey Manning is a 58 y.o.  moderately overweight single [place] American female mother of 2 children, grandmother and 2 grandchildren who currently is out of work on [employer] and disability. I last saw her in the office  01/14/2018.She was referred by Dr. Shawna Orleans for cardiovascular evaluation because of chest pain. Her cardiac risk factor profile is positive for 20-pack-years of tobacco abuse currently smoking one pack per day, treated type of pressure and high cholesterol. She has had multiple heart attacks in the past dating back to 2004. She had a stent placed in her circumflex by Dr. Einar Gip December 2008 (Taxus 2.5 x 24 mm). She had a Medtronic stent placed in her proximal LAD by Dr. Claiborne Billings 06/11/11. Over the last 6 months she has developed recurrent chest pain as well as dyspnea on exertion. Since I saw her on 04/04/14 I obtained a Myoview stress test which was normal and a 2-D echo which revealed normal LV function. Since I saw hera year ago,she's done well clinically. Shegets occasional chest pain which has not changed in frequency or severity.Her blood pressure is somewhat elevated due to back and abdominal pain however.  Her major issue is of pain in her leg and right arm thought to be related to his cervical radiculopathy. She denies chest pain or shortness of breath since I saw her a year ago.  She is scheduled for endoscopy and colonoscopy on 02/01/2019 for which she can interrupt her antiplatelet medications.    Current Meds  Medication Sig  . albuterol (PROVENTIL HFA;VENTOLIN HFA) 108 (90 BASE) MCG/ACT inhaler Inhale 2 puffs into the lungs every 6 (six) hours as needed for wheezing or shortness of breath.  Marland Kitchen amLODipine (NORVASC) 10 MG tablet Take 10 mg by mouth daily.  Marland Kitchen aspirin EC 325 MG tablet Take 325 mg  by mouth daily.  Marland Kitchen atorvastatin (LIPITOR) 40 MG tablet Take 1 tablet (40 mg total) by mouth daily.  . clopidogrel (PLAVIX) 75 MG tablet Take 1 tablet (75 mg total) by mouth daily with breakfast.  . dicyclomine (BENTYL) 20 MG tablet Take 1 tablet (20 mg total) by mouth 3 (three) times daily before meals.  . fluticasone-salmeterol (ADVAIR HFA) 115-21 MCG/ACT inhaler Inhale 2 puffs into the lungs 2 (two) times daily.  Marland Kitchen lisinopril-hydrochlorothiazide (PRINZIDE,ZESTORETIC) 20-12.5 MG per tablet Take 1 tablet by mouth 2 (two) times daily.   . methocarbamol (ROBAXIN) 500 MG tablet Take 1 tablet (500 mg total) by mouth 2 (two) times daily.  Marland Kitchen NARCAN 4 MG/0.1ML LIQD nasal spray kit Place 1 spray into the nose once.   . Nebivolol HCl (BYSTOLIC) 20 MG TABS Take 20 mg by mouth daily.   Marland Kitchen neomycin-polymyxin b-dexamethasone (MAXITROL) 3.5-10000-0.1 SUSP Place 1 drop into the left eye 2 (two) times daily.  . nitroGLYCERIN (NITROSTAT) 0.4 MG SL tablet Place 1 tablet (0.4 mg total) under the tongue every 5 (five) minutes as needed for chest pain.  . pantoprazole (PROTONIX) 20 MG tablet Take 2 tablets (40 mg total) by mouth 2 (two) times daily.  . predniSONE (DELTASONE) 20 MG tablet Take 2 tablets (40 mg total) by mouth daily. Take 40 mg by mouth daily for 3 days, then 69m by mouth daily for 3 days, then 184mdaily for 3 days  . Vitamin D, Ergocalciferol, (DRISDOL) 1.25 MG (50000 UT) CAPS  capsule Take 50,000 Units by mouth once a week.  . zolpidem (AMBIEN) 10 MG tablet Take 10 mg by mouth at bedtime as needed for sleep.     Allergies  Allergen Reactions  . Other Anaphylaxis and Hives    Fresh strawberries (throat swelled)  . Amoxicillin Nausea And Vomiting    Did it involve swelling of the face/tongue/throat, SOB, or low BP? Yes Did it involve sudden or severe rash/hives, skin peeling, or any reaction on the inside of your mouth or nose? Yes Did you need to seek medical attention at a hospital or doctor's  office? Yes When did it last happen?within last 10 years If all above answers are "NO", may proceed with cephalosporin use.   . Dilaudid [Hydromorphone Hcl] Nausea And Vomiting  . Ibuprofen Other (See Comments)    wheezing  . Latex Swelling    No reaction with bandaids    Social History   Socioeconomic History  . Marital status: Single    Spouse name: Not on file  . Number of children: 2  . Years of education: 35  . Highest education level: Not on file  Occupational History  . Not on file  Social Needs  . Financial resource strain: Not on file  . Food insecurity:    Worry: Not on file    Inability: Not on file  . Transportation needs:    Medical: Not on file    Non-medical: Not on file  Tobacco Use  . Smoking status: Current Every Day Smoker    Packs/day: 0.50    Years: 0.00    Pack years: 0.00    Types: Cigarettes  . Smokeless tobacco: Never Used  Substance and Sexual Activity  . Alcohol use: No    Alcohol/week: 0.0 standard drinks    Comment: seldom  . Drug use: Yes    Types: Marijuana    Comment: used last night  . Sexual activity: Yes    Partners: Male    Birth control/protection: Post-menopausal  Lifestyle  . Physical activity:    Days per week: Not on file    Minutes per session: Not on file  . Stress: Not on file  Relationships  . Social connections:    Talks on phone: Not on file    Gets together: Not on file    Attends religious service: Not on file    Active member of club or organization: Not on file    Attends meetings of clubs or organizations: Not on file    Relationship status: Not on file  . Intimate partner violence:    Fear of current or ex partner: Not on file    Emotionally abused: Not on file    Physically abused: Not on file    Forced sexual activity: Not on file  Other Topics Concern  . Not on file  Social History Narrative  . Not on file     Review of Systems: General: negative for chills, fever, night sweats or  weight changes.  Cardiovascular: negative for chest pain, dyspnea on exertion, edema, orthopnea, palpitations, paroxysmal nocturnal dyspnea or shortness of breath Dermatological: negative for rash Respiratory: negative for cough or wheezing Urologic: negative for hematuria Abdominal: negative for nausea, vomiting, diarrhea, bright red blood per rectum, melena, or hematemesis Neurologic: negative for visual changes, syncope, or dizziness All other systems reviewed and are otherwise negative except as noted above.    Blood pressure (!) 154/100, pulse 74, height _0  (1.549 m), weight 208 lb (  94.3 kg), SpO2 96 %.  General appearance: alert and no distress Neck: no adenopathy, no carotid bruit, no JVD, supple, symmetrical, trachea midline and thyroid not enlarged, symmetric, no tenderness/mass/nodules Lungs: clear to auscultation bilaterally Heart: regular rate and rhythm, S1, S2 normal, no murmur, click, rub or gallop Extremities: extremities normal, atraumatic, no cyanosis or edema Pulses: 2+ and symmetric Skin: Skin color, texture, turgor normal. No rashes or lesions Neurologic: Alert and oriented X 3, normal strength and tone. Normal symmetric reflexes. Normal coordination and gait  EKG not performed today  ASSESSMENT AND PLAN:   Essential hypertension History of essential hypertension her blood pressure measured today at 154/100.  She is on amlodipine, lisinopril, hydrochlorothiazide and Bystolic.  Her blood pressures probably elevated because of chronic pain.  Hyperlipidemia History of hyperlipidemia on atorvastatin followed by her PCP  Coronary artery disease History of CAD status post remote stenting in 2004.  She is had a circumflex stented by Dr. Einar Gip in 2008 (Taxus drug-eluting stent 2.5 mm x 24 mm).  She had a Medtronic stent placed in her proximal LAD by Dr. Claiborne Billings 06/11/2011 and a negative Myoview 04/04/2014.  She denies chest pain.      [name] MD  FACP,FACC,FAHA, [facility] 01/25/2019 11:13 AM

## 2019-01-25 NOTE — Assessment & Plan Note (Signed)
History of CAD status post remote stenting in 2004.  She is had a circumflex stented by Dr. Einar Gip in 2008 (Taxus drug-eluting stent 2.5 mm x 24 mm).  She had a Medtronic stent placed in her proximal LAD by Dr. Claiborne Billings 06/11/2011 and a negative Myoview 04/04/2014.  She denies chest pain.

## 2019-01-25 NOTE — Patient Instructions (Signed)
Medication Instructions:  Your physician recommends that you continue on your current medications as directed. Please refer to the Current Medication list given to you today.  If you need a refill on your cardiac medications before your next appointment, please call your pharmacy.   Lab work: NONE If you have labs (blood work) drawn today and your tests are completely normal, you will receive your results only by: Marland Kitchen MyChart Message (if you have MyChart) OR . A paper copy in the mail If you have any lab test that is abnormal or we need to change your treatment, we will call you to review the results.  Testing/Procedures: NONE  Follow-Up: At [facility], you and your health needs are our priority.  As part of our continuing mission to provide you with exceptional heart care, we have created designated Provider Care Teams.  These Care Teams include your primary Cardiologist (physician) and Advanced Practice Providers (APPs -  Physician Assistants and Nurse Practitioners) who all work together to provide you with the care you need, when you need it. . You will need a follow up appointment in 12 months.  Please call our office 2 months in advance to schedule this appointment.  You may see Dr. Gwenlyn Found or one of the following Advanced Practice Providers on your designated Care Team:   . Kerin Ransom, [place] . Almyra Deforest, PA-C . Fabian Sharp, PA-C . Jory Sims, DNP . Rosaria Ferries, PA-C . Roby Lofts, PA-C . Sande Rives, PA-C  Any Other Special Instructions Will Be Listed Below (If Applicable). YOU HAVE BEEN CLEARED AT LOW RISK FROM A CARDIAC STANDPOINT FOR YOUR UPCOMING PROCEDURE.

## 2019-01-25 NOTE — Assessment & Plan Note (Signed)
History of hyperlipidemia on atorvastatin followed by her PCP 

## 2019-01-25 NOTE — Assessment & Plan Note (Signed)
History of essential hypertension her blood pressure measured today at 154/100.  She is on amlodipine, lisinopril, hydrochlorothiazide and Bystolic.  Her blood pressures probably elevated because of chronic pain.

## 2019-01-26 NOTE — Telephone Encounter (Signed)
Okay to hold Plavix for her procedure. 

## 2019-01-26 NOTE — Telephone Encounter (Signed)
Can you please verify that pt CAN hold Plavix for 5 days prior to Endo/colonoscopy scheduled for Wednesday,  02-01-2019? We need to call pt TODAY to let her know since tomorrow would be the first day of the hold.  Thank you so much.

## 2019-01-27 NOTE — Telephone Encounter (Signed)
Called and LM for pt to call back to confirm she understands to hold Plavix starting today.

## 2019-01-27 NOTE — Telephone Encounter (Signed)
   Primary Cardiologist: Quay Burow, MD  Chart reviewed as part of pre-operative protocol coverage. Given past medical history and time since last visit, based on ACC/AHA guidelines, Tracey Manning would be at acceptable risk for the planned procedure without further cardiovascular testing.   Per Dr. Gwenlyn Found, ok to hold Plavix 5 days prior to procedure.   I will route this recommendation to the requesting party via Epic fax function and remove from pre-op pool.  Please call with questions.  Lyda Jester, PA-C 01/27/2019, 8:56 AM

## 2019-01-27 NOTE — Telephone Encounter (Signed)
Patient returning Jan's call stating she understands to hold her plavix starting today.

## 2019-02-01 ENCOUNTER — Encounter: Payer: Self-pay | Admitting: Gastroenterology

## 2019-02-01 ENCOUNTER — Other Ambulatory Visit: Payer: Self-pay

## 2019-02-01 ENCOUNTER — Ambulatory Visit (AMBULATORY_SURGERY_CENTER): Payer: Medicaid Other | Admitting: Gastroenterology

## 2019-02-01 VITALS — BP 131/92 | HR 98 | Temp 98.0°F | Resp 17 | Ht 61.0 in | Wt 208.0 lb

## 2019-02-01 DIAGNOSIS — D123 Benign neoplasm of transverse colon: Secondary | ICD-10-CM | POA: Diagnosis not present

## 2019-02-01 DIAGNOSIS — K2950 Unspecified chronic gastritis without bleeding: Secondary | ICD-10-CM

## 2019-02-01 DIAGNOSIS — K2970 Gastritis, unspecified, without bleeding: Secondary | ICD-10-CM

## 2019-02-01 DIAGNOSIS — K449 Diaphragmatic hernia without obstruction or gangrene: Secondary | ICD-10-CM

## 2019-02-01 DIAGNOSIS — D120 Benign neoplasm of cecum: Secondary | ICD-10-CM

## 2019-02-01 DIAGNOSIS — Z86010 Personal history of colonic polyps: Secondary | ICD-10-CM

## 2019-02-01 DIAGNOSIS — K635 Polyp of colon: Secondary | ICD-10-CM

## 2019-02-01 DIAGNOSIS — D122 Benign neoplasm of ascending colon: Secondary | ICD-10-CM | POA: Diagnosis not present

## 2019-02-01 DIAGNOSIS — K2270 Barrett's esophagus without dysplasia: Secondary | ICD-10-CM

## 2019-02-01 MED ORDER — SODIUM CHLORIDE 0.9 % IV SOLN: 500.0000 mL | Freq: Once | INTRAVENOUS | Status: DC

## 2019-02-01 NOTE — Progress Notes (Signed)
Called to room to assist during endoscopic procedure.  Patient ID and intended procedure confirmed with present staff. Received instructions for my participation in the procedure from the performing physician.  

## 2019-02-01 NOTE — Progress Notes (Signed)
Report to PACU, RN, vss, BBS= Clear.  

## 2019-02-01 NOTE — Op Note (Signed)
[facility] Patient Name: Tracey Manning Procedure Date: 02/01/2019 2:38 PM MRN: [id number] Endoscopist: Tracey Manning , MD Age: 58 Referring MD:  Date of Birth: February 24, 1961 Gender: Female Account #: [id number] Procedure:                Colonoscopy Indications:              High risk colon cancer surveillance: Personal                            history of colonic polyps - history of numerous                            polyps removed 05/2018 including large piecemeal                            resection of adenoma and last exam limited by fair                            prep Medicines:                Monitored Anesthesia Care Procedure:                Pre-Anesthesia Assessment:                           - Prior to the procedure, a History and Physical                            was performed, and patient medications and                            allergies were reviewed. The patient's tolerance of                            previous anesthesia was also reviewed. The risks                            and benefits of the procedure and the sedation                            options and risks were discussed with the patient.                            All questions were answered, and informed consent                            was obtained. Prior Anticoagulants: The patient has                            taken Plavix (clopidogrel), last dose was 5 days                            prior to procedure. ASA Grade Assessment: [name] - A  patient with severe systemic disease. After                            reviewing the risks and benefits, the patient was                            deemed in satisfactory condition to undergo the                            procedure.                           After obtaining informed consent, the colonoscope                            was passed under direct vision. Throughout the                            procedure, the  patient's blood pressure, pulse, and                            oxygen saturations were monitored continuously. The                            Model PCF-H190DL [id number]) scope was introduced                            through the anus and advanced to the the cecum,                            identified by appendiceal orifice and ileocecal                            valve. The colonoscopy was performed without                            difficulty. The patient tolerated the procedure                            well. The quality of the bowel preparation was                            good. The ileocecal valve, appendiceal orifice, and                            rectum were photographed. Scope In: 3:01:02 PM Scope Out: 3:18:43 PM Scope Withdrawal Time: 0 hours 15 minutes 42 seconds  Total Procedure Duration: 0 hours 17 minutes 41 seconds  Findings:                 The perianal and digital rectal examinations were                            normal.  A 3 mm polyp was found in the cecum. The polyp was                            sessile. The polyp was removed with a cold snare.                            Resection and retrieval were complete.                           A diminutive polyp was found in the ileocecal                            valve. The polyp was sessile. The polyp was removed                            with a cold snare. Resection and retrieval were                            complete.                           A 3 mm polyp was found in the ascending colon. The                            polyp was sessile. The polyp was removed with a                            cold snare. Resection and retrieval were complete.                           A 3 mm polyp was found in the hepatic flexure. The                            polyp was sessile. The polyp was removed with a                            cold snare. Resection and retrieval were complete.                            A large post polypectomy scar was found in the                            transverse colon. The scar tissue was healthy in                            appearance. There was no evidence of the previous                            polyp. Tattoo was noted distal to the polypectomy                            site.  A 3 mm polyp was found in the transverse colon. The                            polyp was sessile. The polyp was removed with a                            cold snare. Resection and retrieval were complete.                           Multiple medium-mouthed diverticula were found in                            the entire colon.                           The exam was otherwise without abnormality. Complications:            No immediate complications. Estimated blood loss:                            Minimal. Estimated Blood Loss:     Estimated blood loss was minimal. Impression:               - One 3 mm polyp in the cecum, removed with a cold                            snare. Resected and retrieved.                           - One diminutive polyp at the ileocecal valve,                            removed with a cold snare. Resected and retrieved.                           - One 3 mm polyp in the ascending colon, removed                            with a cold snare. Resected and retrieved.                           - One 3 mm polyp at the hepatic flexure, removed                            with a cold snare. Resected and retrieved.                           - Post-polypectomy scar in the transverse colon.                           - One 3 mm polyp in the transverse colon, removed                            with a cold snare.  Resected and retrieved.                           - Diverticulosis in the entire examined colon.                           - The examination was otherwise normal. Recommendation:           - Patient has a contact number available for                             emergencies. The signs and symptoms of potential                            delayed complications were discussed with the                            patient. Return to normal activities tomorrow.                            Written discharge instructions were provided to the                            patient.                           - Resume previous diet.                           - Continue present medications.                           - Resume Plavix tomorrow                           - Await pathology results. Tracey Lipps P. Armbruster, MD 02/01/2019 3:26:10 PM This report has been signed electronically.

## 2019-02-01 NOTE — Op Note (Signed)
[facility] Patient Name: Tracey Manning Procedure Date: 02/01/2019 2:39 PM MRN: [id number] Endoscopist: Remo Lipps P. Havery Moros , MD Age: 58 Referring MD:  Date of Birth: 1961-03-07 Gender: Female Account #: [id number] Procedure:                Upper GI endoscopy Indications:              Follow-up of Barrett's esophagus, inflammatory                            changes / ? nodularity noted on the last exam, now                            on high dose protonix, ensure no dysplasia. also                            with history of GIM Medicines:                Monitored Anesthesia Care Procedure:                Pre-Anesthesia Assessment:                           - Prior to the procedure, a History and Physical                            was performed, and patient medications and                            allergies were reviewed. The patient's tolerance of                            previous anesthesia was also reviewed. The risks                            and benefits of the procedure and the sedation                            options and risks were discussed with the patient.                            All questions were answered, and informed consent                            was obtained. Prior Anticoagulants: The patient has                            taken Plavix (clopidogrel), last dose was 5 days                            prior to procedure. ASA Grade Assessment: [name] - A                            patient with severe systemic disease. After  reviewing the risks and benefits, the patient was                            deemed in satisfactory condition to undergo the                            procedure.                           After obtaining informed consent, the endoscope was                            passed under direct vision. Throughout the                            procedure, the patient's blood pressure, pulse, and              oxygen saturations were monitored continuously. The                            Model GIF-[id number]) scope was introduced                            through the mouth, and advanced to the second part                            of duodenum. The upper GI endoscopy was                            accomplished without difficulty. The patient                            tolerated the procedure well. Scope In: Scope Out: Findings:                 Esophagogastric landmarks were identified: the                            Z-line was found at 35 cm, the gastroesophageal                            junction was found at 36 cm and the upper extent of                            the gastric folds was found at 37 cm from the                            incisors.                           A small hiatal hernia was present.                           The Z-line was irregular with a roughly 1 cm  segment of salmon colored mucosa extending up from                            the GEJ circumferentially. No nodularity was noted,                            and was found 35 cm from the incisors. Biopsies                            were taken with a cold forceps for histology.                           The exam of the esophagus was otherwise normal.                           Patchy erythematous mucosa was found in the gastric                            antrum.                           The exam of the stomach was otherwise normal.                           Biopsies were taken with a cold forceps in the                            gastric body, at the incisura and in the gastric                            antrum for histology to assess for extent of GIM.                           Patchy mildly erythematous mucosa was found in the                            duodenal bulb.                           The exam of the duodenum was otherwise normal. Complications:            No  immediate complications. Estimated blood loss:                            Minimal. Estimated Blood Loss:     Estimated blood loss was minimal. Impression:               - Esophagogastric landmarks identified.                           - Small hiatal hernia.                           - Z-line irregular, 35 cm from the incisors.  Biopsied.                           - Erythematous mucosa in the antrum.                           - Erythematous duodenopathy.                           - Biopsies were taken with a cold forceps for                            histology in the gastric body, at the incisura and                            in the gastric antrum. Recommendation:           - Patient has a contact number available for                            emergencies. The signs and symptoms of potential                            delayed complications were discussed with the                            patient. Return to normal activities tomorrow.                            Written discharge instructions were provided to the                            patient.                           - Resume previous diet.                           - Continue present medications.                           - Resume Plavix tomorrow                           - Await pathology results. Remo Lipps P. Armbruster, MD 02/01/2019 3:30:45 PM This report has been signed electronically.

## 2019-02-01 NOTE — Patient Instructions (Signed)
YOU HAD AN ENDOSCOPIC PROCEDURE TODAY AT [facility]:   Refer to the procedure report that was given to you for any specific questions about what was found during the examination.  If the procedure report does not answer your questions, please call your gastroenterologist to clarify.  If you requested that your care partner not be given the details of your procedure findings, then the procedure report has been included in a sealed envelope for you to review at your convenience later.  YOU SHOULD EXPECT: Some feelings of bloating in the abdomen. Passage of more gas than usual.  Walking can help get rid of the air that was put into your GI tract during the procedure and reduce the bloating. If you had a lower endoscopy (such as a colonoscopy or flexible sigmoidoscopy) you may notice spotting of blood in your stool or on the toilet paper. If you underwent a bowel prep for your procedure, you may not have a normal bowel movement for a few days.  Please Note:  You might notice some irritation and congestion in your nose or some drainage.  This is from the oxygen used during your procedure.  There is no need for concern and it should clear up in a day or so.  SYMPTOMS TO REPORT IMMEDIATELY:   Following lower endoscopy (colonoscopy or flexible sigmoidoscopy):  Excessive amounts of blood in the stool  Significant tenderness or worsening of abdominal pains  Swelling of the abdomen that is new, acute  Fever of 100F or higher   Following upper endoscopy (EGD)  Vomiting of blood or coffee ground material  New chest pain or pain under the shoulder blades  Painful or persistently difficult swallowing  New shortness of breath  Fever of 100F or higher  Black, tarry-looking stools  For urgent or emergent issues, a gastroenterologist can be reached at any hour by calling [phone].   DIET:  We do recommend a small meal at first, but then you may proceed to your regular diet.  Drink  plenty of fluids but you should avoid alcoholic beverages for 24 hours.  ACTIVITY:  You should plan to take it easy for the rest of today and you should NOT DRIVE or use heavy machinery until tomorrow (because of the sedation medicines used during the test).    FOLLOW UP: Our staff will call the number listed on your records the next business day following your procedure to check on you and address any questions or concerns that you may have regarding the information given to you following your procedure. If we do not reach you, we will leave a message.  However, if you are feeling well and you are not experiencing any problems, there is no need to return our call.  We will assume that you have returned to your regular daily activities without incident.  If any biopsies were taken you will be contacted by phone or by letter within the next 1-3 weeks.  Please call us at [phone] if you have not heard about the biopsies in 3 weeks.   Await for biopsy results Polyps (handout given)  Resume Plavix tomorrow  SIGNATURES/CONFIDENTIALITY: You and/or your care partner have signed paperwork which will be entered into your electronic medical record.  These signatures attest to the fact that that the information above on your After Visit Summary has been reviewed and is understood.  Full responsibility of the confidentiality of this discharge information lies with you and/or your care-partner.

## 2019-02-01 NOTE — Progress Notes (Signed)
01/25/2019 patient went to ER at [facility] for neck pain. See notes.

## 2019-02-02 ENCOUNTER — Telehealth: Payer: Self-pay | Admitting: *Deleted

## 2019-02-02 NOTE — Telephone Encounter (Signed)
  Follow up Call-  Call back number 02/01/2019 06/16/2018  Post procedure Call Back phone  # [phone]  Permission to leave phone message Yes Yes  Some recent data might be hidden     Patient questions:  Do you have a fever, pain , or abdominal swelling? No. Pain Score  0 *  Have you tolerated food without any problems? Yes.    Have you been able to return to your normal activities? Yes.    Do you have any questions about your discharge instructions: Diet   No. Medications  No. Follow up visit  No.  Do you have questions or concerns about your Care? No.  Actions: * If pain score is 4 or above: No action needed, pain <4.

## 2019-02-07 ENCOUNTER — Encounter: Payer: Self-pay | Admitting: Gastroenterology

## 2019-06-05 ENCOUNTER — Ambulatory Visit (INDEPENDENT_AMBULATORY_CARE_PROVIDER_SITE_OTHER): Payer: Medicaid Other | Admitting: Obstetrics

## 2019-06-05 ENCOUNTER — Other Ambulatory Visit: Payer: Self-pay | Admitting: Obstetrics

## 2019-06-05 ENCOUNTER — Other Ambulatory Visit (HOSPITAL_COMMUNITY)
Admission: RE | Admit: 2019-06-05 | Discharge: 2019-06-05 | Disposition: A | Discharge status: Home / Self Care | Payer: Medicaid Other | Source: Ambulatory Visit | Attending: Obstetrics | Admitting: Obstetrics

## 2019-06-05 ENCOUNTER — Other Ambulatory Visit: Payer: Self-pay

## 2019-06-05 ENCOUNTER — Encounter: Payer: Self-pay | Admitting: Obstetrics

## 2019-06-05 VITALS — BP 163/105 | HR 77 | Temp 98.1°F | Wt 206.1 lb

## 2019-06-05 DIAGNOSIS — Z1231 Encounter for screening mammogram for malignant neoplasm of breast: Secondary | ICD-10-CM

## 2019-06-05 DIAGNOSIS — N898 Other specified noninflammatory disorders of vagina: Secondary | ICD-10-CM

## 2019-06-05 DIAGNOSIS — Z01419 Encounter for gynecological examination (general) (routine) without abnormal findings: Secondary | ICD-10-CM

## 2019-06-05 DIAGNOSIS — Z0000 Encounter for general adult medical examination without abnormal findings: Secondary | ICD-10-CM | POA: Diagnosis not present

## 2019-06-05 DIAGNOSIS — R102 Pelvic and perineal pain: Secondary | ICD-10-CM | POA: Diagnosis not present

## 2019-06-05 MED ORDER — OXYCODONE-ACETAMINOPHEN 10-325 MG PO TABS: 1.0000 | ORAL_TABLET | ORAL | 0 refills | Status: DC | PRN

## 2019-06-05 MED ORDER — KETOROLAC TROMETHAMINE 60 MG/2ML IM SOLN
60.0000 mg | Freq: Once | INTRAMUSCULAR | Status: AC
  Administered 2019-06-05: 60 mg via INTRAMUSCULAR

## 2019-06-05 NOTE — Progress Notes (Signed)
Subjective:        Tracey Manning is a 58 y.o. female here for a routine exam.  Current complaints: Severe pelvic pain, worsening over the past few months.  Denies fever, chills, dysuria or vaginal bleeding.  Personal health questionnaire:  Is patient [name], have a family history of breast and/or ovarian cancer: no Is there a family history of uterine cancer diagnosed at age < 61, gastrointestinal cancer, urinary tract cancer, family member who is a Field seismologist syndrome-associated carrier: no Is the patient overweight and hypertensive, family history of diabetes, personal history of gestational diabetes, preeclampsia or PCOS: yes Is patient over 32, have PCOS,  family history of premature CHD under age 102, diabetes, smoke, have hypertension or peripheral artery disease:  no At any time, has a partner hit, kicked or otherwise hurt or frightened you?: no Over the past 2 weeks, have you felt down, depressed or hopeless?: no Over the past 2 weeks, have you felt little interest or pleasure in doing things?:no   Gynecologic History No LMP recorded (lmp unknown). Patient is postmenopausal. Contraception: post menopausal status Last Pap: 10-25-2017. Results were: normal Last mammogram: 04/10/2015. Results were: normal  Obstetric History OB History  Gravida Para Term Preterm AB Living  _0 SAB TAB Ectopic Multiple Live Births  6       2    # Outcome Date GA Lbr Len/2nd Weight Sex Delivery Anes PTL Lv  8 Gravida           7 Gravida           6 SAB           5 SAB           4 SAB           3 SAB           2 SAB           1 SAB             Past Medical History:  Diagnosis Date  . Anxiety   . Arthritis   . Asthma   . Barrett's esophagus   . Chest pain   . Collagen vascular disease ([place])   . Colon polyps   . Coronary artery disease   . Depression   . Dyslipidemia   . GERD (gastroesophageal reflux disease)    occ  . Hypertension   . Myocardial infarct ([place])  [id number]  . Sciatica   . Sleep apnea    no CPAP ordered but using oxygen at bedtime  . Tobacco abuse     Past Surgical History:  Procedure Laterality Date  . CARDIAC CATHETERIZATION  10/09/2003   [facility] [place], [place]) - LAD with 30% prox narrowing, 50% stenosis in mid-portion of PLA; RCA with 40% narrowing proximally ([name], [name])  . CARDIAC CATHETERIZATION  10/10/2007   70% stenosis in first septal perforator branch of LAD, 60-70% narrowing in mid LAD, 20% narrowing in mid AV groove Cfx with 80% diffuse narrowing in small distal marginal, total occlusion of mid RCA with L to R collaterals, 90% stenosis diffusely in prox branch of RCA followed by 70% stenosis in secondary curve & 80% stenosis in small marginal branch ([name])  . CARDIAC CATHETERIZATION  05/28/2008   normal L main, RCA with 100% prox lesion w/distal filling from LAD collaterals, LAD with 20% prox tubular lesion/ 40% mid LAD lesion/previous  stent patent ([name])  . CARDIAC CATHETERIZATION  09/15/2009   discrete 100% osital RCA lesion, 50% prox LAD lesion, non-obstructive disease in all coronaries ([name])  . CESAREAN SECTION    . COLONOSCOPY    . COLONOSCOPY W/ POLYPECTOMY    . CORONARY ANGIOPLASTY WITH STENT PLACEMENT  10/31/2007   PCI of distal Cfx with 2.25x40m Taxus Adam DES, 60% narrowing of mid LAD (Dr. TCorky Downs  . CORONARY ANGIOPLASTY WITH STENT PLACEMENT  06/11/2011   PCI of prox-mid LAD with 3x188mDES Resolute (Dr. T.[name] . DILATION AND CURETTAGE, DIAGNOSTIC / THERAPEUTIC    . DILITATION & CURRETTAGE/HYSTROSCOPY WITH HYDROTHERMAL ABLATION N/A 04/03/2016   Procedure: DILATATION & CURETTAGE/HYSTEROSCOPY WITH HYDROTHERMAL ABLATION;  Surgeon: ChShelly BombardMD;  [facility];  Service: Gynecology;  Laterality: N/A;  . NM MYOCAR PERF WALL MOTION  08/2009   persantine myoview - normal perfusion in all regions, perfusion defect in anterior region (breast attenuation), EF  52%, low risk scan     Current Outpatient Medications:  .  albuterol (PROVENTIL HFA;VENTOLIN HFA) 108 (90 BASE) MCG/ACT inhaler, Inhale 2 puffs into the lungs every 6 (six) hours as needed for wheezing or shortness of breath., Disp: , Rfl:  .  amLODipine (NORVASC) 10 MG tablet, Take 10 mg by mouth daily., Disp: , Rfl:  .  aspirin EC 325 MG tablet, Take 325 mg by mouth daily., Disp: , Rfl:  .  atorvastatin (LIPITOR) 40 MG tablet, Take 1 tablet (40 mg total) by mouth daily., Disp: 30 tablet, Rfl: 2 .  clopidogrel (PLAVIX) 75 MG tablet, Take 1 tablet (75 mg total) by mouth daily with breakfast., Disp: 30 tablet, Rfl: 2 .  dicyclomine (BENTYL) 20 MG tablet, Take 1 tablet (20 mg total) by mouth 3 (three) times daily before meals., Disp: 90 tablet, Rfl: 2 .  fluticasone-salmeterol (ADVAIR HFA) 115-21 MCG/ACT inhaler, Inhale 2 puffs into the lungs 2 (two) times daily., Disp: 1 Inhaler, Rfl: 12 .  lisinopril-hydrochlorothiazide (PRINZIDE,ZESTORETIC) 20-12.5 MG per tablet, Take 1 tablet by mouth 2 (two) times daily. , Disp: , Rfl:  .  methocarbamol (ROBAXIN) 500 MG tablet, Take 1 tablet (500 mg total) by mouth 2 (two) times daily., Disp: 20 tablet, Rfl: 0 .  NARCAN 4 MG/0.1ML LIQD nasal spray kit, Place 1 spray into the nose once. , Disp: , Rfl:  .  Nebivolol HCl (BYSTOLIC) 20 MG TABS, Take 20 mg by mouth daily. , Disp: , Rfl:  .  neomycin-polymyxin b-dexamethasone (MAXITROL) 3.5-10000-0.1 SUSP, Place 1 drop into the left eye 2 (two) times daily., Disp: , Rfl:  .  nitroGLYCERIN (NITROSTAT) 0.4 MG SL tablet, Place 1 tablet (0.4 mg total) under the tongue every 5 (five) minutes as needed for chest pain., Disp: 25 tablet, Rfl: 6 .  pantoprazole (PROTONIX) 20 MG tablet, Take 2 tablets (40 mg total) by mouth 2 (two) times daily., Disp: 30 tablet, Rfl: 0 .  predniSONE (DELTASONE) 20 MG tablet, Take 2 tablets (40 mg total) by mouth daily. Take 40 mg by mouth daily for 3 days, then 2020my mouth daily for 3 days,  then 31m65mily for 3 days, Disp: 12 tablet, Rfl: 0 .  Vitamin D, Ergocalciferol, (DRISDOL) 1.25 MG (50000 UT) CAPS capsule, Take 50,000 Units by mouth once a week., Disp: , Rfl:  .  zolpidem (AMBIEN) 10 MG tablet, Take 10 mg by mouth at bedtime as needed for sleep., Disp: , Rfl:  .  oxyCODONE-acetaminophen (  PERCOCET) 10-325 MG tablet, Take 1 tablet by mouth every 4 (four) hours as needed for pain., Disp: 30 tablet, Rfl: 0  Current Facility-Administered Medications:  .  0.9 %  sodium chloride infusion, 500 mL, Intravenous, Once, Armbruster, Carlota Raspberry, MD Allergies  Allergen Reactions  . Other Anaphylaxis and Hives    Fresh strawberries (throat swelled)  . Amoxicillin Nausea And Vomiting    Did it involve swelling of the face/tongue/throat, SOB, or low BP? Yes Did it involve sudden or severe rash/hives, skin peeling, or any reaction on the inside of your mouth or nose? Yes Did you need to seek medical attention at a hospital or doctor's office? Yes When did it last happen?within last 10 years If all above answers are "NO", may proceed with cephalosporin use.   . Dilaudid [Hydromorphone Hcl] Nausea And Vomiting  . Ibuprofen Other (See Comments)    wheezing  . Latex Swelling    No reaction with bandaids    Social History   Tobacco Use  . Smoking status: Current Every Day Smoker    Packs/day: 0.50    Years: 0.00    Pack years: 0.00    Types: Cigarettes  . Smokeless tobacco: Never Used  Substance Use Topics  . Alcohol use: No    Alcohol/week: 0.0 standard drinks    Comment: seldom    Family History  Problem Relation Age of Onset  . Leukemia Mother   . Clotting disorder Father        blood clot  . Hypertension Sister   . Diabetes Sister   . Stroke Sister 21  . Bleeding Disorder Son        ITP "free bleeding disorder"  . Colon cancer Paternal Grandmother   . Diabetes Paternal Grandmother   . Stomach cancer Neg Hx   . Pancreatic cancer Neg Hx       Review of  Systems  Constitutional: negative for fatigue and weight loss Respiratory: negative for cough and wheezing Cardiovascular: negative for chest pain, fatigue and palpitations Gastrointestinal: negative for abdominal pain and change in bowel habits Musculoskeletal:negative for myalgias Neurological: negative for gait problems and tremors Behavioral/Psych: negative for abusive relationship, depression Endocrine: negative for temperature intolerance    Genitourinary:negative for abnormal menstrual periods, genital lesions, hot flashes, sexual problems and vaginal discharge.  Positive for pelvic pain Integument/breast: negative for breast lump, breast tenderness, nipple discharge and skin lesion(s)    Objective:       BP (!) 163/105   Pulse 77   Temp 98.1 F (36.7 C)   Wt 206 lb 1.6 oz (93.5 kg)   LMP  (LMP Unknown) Comment: verified BEFORE imaging  BMI 38.94 kg/m  General:   alert  Skin:   no rash or abnormalities  Lungs:   clear to auscultation bilaterally  Heart:   regular rate and rhythm, S1, S2 normal, no murmur, click, rub or gallop  Breasts:   normal without suspicious masses, skin or nipple changes or axillary nodes  Abdomen:  normal findings: no organomegaly, soft, non-tender and no hernia  Pelvis:  External genitalia: normal general appearance Urinary system: urethral meatus normal and bladder without fullness, nontender Vaginal: normal without tenderness, induration or masses Cervix: normal appearance Adnexa: normal bimanual exam Uterus: mid-position and tender, normal size   Lab Review Urine pregnancy test Labs reviewed yes Radiologic studies reviewed yes  50% of 25 min visit spent on counseling and coordination of care.   Assessment:     1. Encounter  for routine gynecological examination with Papanicolaou smear of cervix Rx: - Cytology - PAP  2. Vaginal discharge Rx: - Cervicovaginal ancillary only( [place])  3. Pelvic pain Rx: - ketorolac (TORADOL)  injection 60 mg - US PELVIC COMPLETE WITH TRANSVAGINAL; Future - oxyCODONE-acetaminophen (PERCOCET) 10-325 MG tablet; Take 1 tablet by mouth every 4 (four) hours as needed for pain.  Dispense: 30 tablet; Refill: 0    Plan:    Education reviewed: calcium supplements, depression evaluation, low fat, low cholesterol diet, safe sex/STD prevention, self breast exams, smoking cessation and weight bearing exercise. Mammogram ordered. Follow up in: 4 weeks.   Meds ordered this encounter  Medications  . ketorolac (TORADOL) injection 60 mg  . oxyCODONE-acetaminophen (PERCOCET) 10-325 MG tablet    Sig: Take 1 tablet by mouth every 4 (four) hours as needed for pain.    Dispense:  30 tablet    Refill:  0   Orders Placed This Encounter  Procedures  . US PELVIC COMPLETE WITH TRANSVAGINAL    Standing Status:   Future    Standing Expiration Date:   08/05/2020    Order Specific Question:   Reason for Exam (SYMPTOM  OR DIAGNOSIS REQUIRED)    Answer:   Pelvic pain    Order Specific Question:   Preferred imaging location?    Answer:   [facility] Out[facility] Ul[facility]ound    [name] MD 06-05-2019

## 2019-06-05 NOTE — Progress Notes (Signed)
Administrations This Visit    ketorolac (TORADOL) injection 60 mg    Admin Date 06/05/2019 Action Given Dose 60 mg Route Intramuscular Administered By Tamela Oddi, RMA

## 2019-06-05 NOTE — Progress Notes (Signed)
Pt presents for annual c/o pelvic pain. Wants all STD testing. Needs Mammogram Colonoscopy UTD per pt

## 2019-06-06 LAB — CERVICOVAGINAL ANCILLARY ONLY
Bacterial vaginitis: POSITIVE — AB
Candida vaginitis: NEGATIVE
Chlamydia: NEGATIVE
Neisseria Gonorrhea: NEGATIVE
Trichomonas: NEGATIVE

## 2019-06-07 ENCOUNTER — Other Ambulatory Visit: Payer: Self-pay | Admitting: Obstetrics

## 2019-06-07 DIAGNOSIS — B9689 Other specified bacterial agents as the cause of diseases classified elsewhere: Secondary | ICD-10-CM

## 2019-06-07 LAB — CYTOLOGY - PAP
Diagnosis: NEGATIVE
HPV: NOT DETECTED

## 2019-06-07 MED ORDER — TINIDAZOLE 500 MG PO TABS: 1000.0000 mg | ORAL_TABLET | Freq: Every day | ORAL | 2 refills | Status: DC

## 2019-06-08 ENCOUNTER — Telehealth: Payer: Self-pay | Admitting: *Deleted

## 2019-06-08 NOTE — Telephone Encounter (Signed)
PA for Tinidazole has been submitted and approved. Pharmacy aware.

## 2019-06-11 ENCOUNTER — Encounter (HOSPITAL_COMMUNITY): Payer: Self-pay

## 2019-06-11 ENCOUNTER — Inpatient Hospital Stay (HOSPITAL_COMMUNITY)
Admission: EM | Admit: 2019-06-11 | Discharge: 2019-06-20 | DRG: 246 | Disposition: A | Discharge status: Home / Self Care | Payer: Medicaid Other | Attending: Internal Medicine | Admitting: Internal Medicine

## 2019-06-11 ENCOUNTER — Emergency Department (HOSPITAL_COMMUNITY): Payer: Medicaid Other

## 2019-06-11 ENCOUNTER — Other Ambulatory Visit: Payer: Self-pay

## 2019-06-11 DIAGNOSIS — I429 Cardiomyopathy, unspecified: Secondary | ICD-10-CM | POA: Diagnosis present

## 2019-06-11 DIAGNOSIS — I272 Pulmonary hypertension, unspecified: Secondary | ICD-10-CM | POA: Diagnosis present

## 2019-06-11 DIAGNOSIS — E785 Hyperlipidemia, unspecified: Secondary | ICD-10-CM | POA: Diagnosis not present

## 2019-06-11 DIAGNOSIS — Z806 Family history of leukemia: Secondary | ICD-10-CM

## 2019-06-11 DIAGNOSIS — Z7902 Long term (current) use of antithrombotics/antiplatelets: Secondary | ICD-10-CM

## 2019-06-11 DIAGNOSIS — R0609 Other forms of dyspnea: Secondary | ICD-10-CM

## 2019-06-11 DIAGNOSIS — Z7951 Long term (current) use of inhaled steroids: Secondary | ICD-10-CM

## 2019-06-11 DIAGNOSIS — R0789 Other chest pain: Secondary | ICD-10-CM | POA: Diagnosis present

## 2019-06-11 DIAGNOSIS — R079 Chest pain, unspecified: Secondary | ICD-10-CM | POA: Diagnosis not present

## 2019-06-11 DIAGNOSIS — Z91018 Allergy to other foods: Secondary | ICD-10-CM

## 2019-06-11 DIAGNOSIS — Z780 Asymptomatic menopausal state: Secondary | ICD-10-CM

## 2019-06-11 DIAGNOSIS — Z20828 Contact with and (suspected) exposure to other viral communicable diseases: Secondary | ICD-10-CM | POA: Diagnosis present

## 2019-06-11 DIAGNOSIS — Z91040 Latex allergy status: Secondary | ICD-10-CM

## 2019-06-11 DIAGNOSIS — I509 Heart failure, unspecified: Secondary | ICD-10-CM

## 2019-06-11 DIAGNOSIS — Z6837 Body mass index (BMI) 37.0-37.9, adult: Secondary | ICD-10-CM

## 2019-06-11 DIAGNOSIS — G4733 Obstructive sleep apnea (adult) (pediatric): Secondary | ICD-10-CM | POA: Diagnosis present

## 2019-06-11 DIAGNOSIS — Z955 Presence of coronary angioplasty implant and graft: Secondary | ICD-10-CM

## 2019-06-11 DIAGNOSIS — J45901 Unspecified asthma with (acute) exacerbation: Secondary | ICD-10-CM | POA: Diagnosis present

## 2019-06-11 DIAGNOSIS — Z9861 Coronary angioplasty status: Secondary | ICD-10-CM | POA: Diagnosis present

## 2019-06-11 DIAGNOSIS — I2510 Atherosclerotic heart disease of native coronary artery without angina pectoris: Secondary | ICD-10-CM | POA: Diagnosis present

## 2019-06-11 DIAGNOSIS — I10 Essential (primary) hypertension: Secondary | ICD-10-CM | POA: Diagnosis not present

## 2019-06-11 DIAGNOSIS — I5043 Acute on chronic combined systolic (congestive) and diastolic (congestive) heart failure: Secondary | ICD-10-CM | POA: Diagnosis present

## 2019-06-11 DIAGNOSIS — K2270 Barrett's esophagus without dysplasia: Secondary | ICD-10-CM | POA: Diagnosis present

## 2019-06-11 DIAGNOSIS — N179 Acute kidney failure, unspecified: Secondary | ICD-10-CM | POA: Diagnosis not present

## 2019-06-11 DIAGNOSIS — F419 Anxiety disorder, unspecified: Secondary | ICD-10-CM | POA: Diagnosis present

## 2019-06-11 DIAGNOSIS — Z8719 Personal history of other diseases of the digestive system: Secondary | ICD-10-CM

## 2019-06-11 DIAGNOSIS — Z7982 Long term (current) use of aspirin: Secondary | ICD-10-CM

## 2019-06-11 DIAGNOSIS — Z9119 Patient's noncompliance with other medical treatment and regimen: Secondary | ICD-10-CM

## 2019-06-11 DIAGNOSIS — Z881 Allergy status to other antibiotic agents status: Secondary | ICD-10-CM

## 2019-06-11 DIAGNOSIS — F329 Major depressive disorder, single episode, unspecified: Secondary | ICD-10-CM | POA: Diagnosis present

## 2019-06-11 DIAGNOSIS — J9621 Acute and chronic respiratory failure with hypoxia: Secondary | ICD-10-CM | POA: Diagnosis not present

## 2019-06-11 DIAGNOSIS — Z885 Allergy status to narcotic agent status: Secondary | ICD-10-CM

## 2019-06-11 DIAGNOSIS — Z832 Family history of diseases of the blood and blood-forming organs and certain disorders involving the immune mechanism: Secondary | ICD-10-CM

## 2019-06-11 DIAGNOSIS — Z8249 Family history of ischemic heart disease and other diseases of the circulatory system: Secondary | ICD-10-CM

## 2019-06-11 DIAGNOSIS — N760 Acute vaginitis: Secondary | ICD-10-CM | POA: Diagnosis present

## 2019-06-11 DIAGNOSIS — I5021 Acute systolic (congestive) heart failure: Secondary | ICD-10-CM

## 2019-06-11 DIAGNOSIS — K219 Gastro-esophageal reflux disease without esophagitis: Secondary | ICD-10-CM | POA: Diagnosis present

## 2019-06-11 DIAGNOSIS — E876 Hypokalemia: Secondary | ICD-10-CM

## 2019-06-11 DIAGNOSIS — Z833 Family history of diabetes mellitus: Secondary | ICD-10-CM

## 2019-06-11 DIAGNOSIS — I252 Old myocardial infarction: Secondary | ICD-10-CM

## 2019-06-11 DIAGNOSIS — F17200 Nicotine dependence, unspecified, uncomplicated: Secondary | ICD-10-CM | POA: Diagnosis present

## 2019-06-11 DIAGNOSIS — N183 Chronic kidney disease, stage 3 (moderate): Secondary | ICD-10-CM | POA: Diagnosis present

## 2019-06-11 DIAGNOSIS — I25110 Atherosclerotic heart disease of native coronary artery with unstable angina pectoris: Secondary | ICD-10-CM | POA: Diagnosis present

## 2019-06-11 DIAGNOSIS — R042 Hemoptysis: Secondary | ICD-10-CM | POA: Diagnosis not present

## 2019-06-11 DIAGNOSIS — I160 Hypertensive urgency: Secondary | ICD-10-CM | POA: Diagnosis present

## 2019-06-11 DIAGNOSIS — Z800 Family history of malignant neoplasm of digestive organs: Secondary | ICD-10-CM

## 2019-06-11 DIAGNOSIS — Z79899 Other long term (current) drug therapy: Secondary | ICD-10-CM

## 2019-06-11 DIAGNOSIS — I130 Hypertensive heart and chronic kidney disease with heart failure and stage 1 through stage 4 chronic kidney disease, or unspecified chronic kidney disease: Principal | ICD-10-CM | POA: Diagnosis present

## 2019-06-11 DIAGNOSIS — M1990 Unspecified osteoarthritis, unspecified site: Secondary | ICD-10-CM | POA: Diagnosis present

## 2019-06-11 DIAGNOSIS — Z823 Family history of stroke: Secondary | ICD-10-CM

## 2019-06-11 DIAGNOSIS — R0602 Shortness of breath: Secondary | ICD-10-CM

## 2019-06-11 DIAGNOSIS — J449 Chronic obstructive pulmonary disease, unspecified: Secondary | ICD-10-CM | POA: Diagnosis present

## 2019-06-11 DIAGNOSIS — Z9981 Dependence on supplemental oxygen: Secondary | ICD-10-CM

## 2019-06-11 LAB — CBC WITH DIFFERENTIAL/PLATELET
Abs Immature Granulocytes: 0.03 10*3/uL (ref 0.00–0.07)
Basophils Absolute: 0 10*3/uL (ref 0.0–0.1)
Basophils Relative: 0 %
Eosinophils Absolute: 0 10*3/uL (ref 0.0–0.5)
Eosinophils Relative: 1 %
HCT: 44.3 % (ref 36.0–46.0)
Hemoglobin: 14.2 g/dL (ref 12.0–15.0)
Immature Granulocytes: 0 %
Lymphocytes Relative: 8 %
Lymphs Abs: 0.6 10*3/uL — ABNORMAL LOW (ref 0.7–4.0)
MCH: 27.5 pg (ref 26.0–34.0)
MCHC: 32.1 g/dL (ref 30.0–36.0)
MCV: 85.9 fL (ref 80.0–100.0)
Monocytes Absolute: 0.2 10*3/uL (ref 0.1–1.0)
Monocytes Relative: 2 %
Neutro Abs: 7.2 10*3/uL (ref 1.7–7.7)
Neutrophils Relative %: 89 %
Platelets: 232 10*3/uL (ref 150–400)
RBC: 5.16 MIL/uL — ABNORMAL HIGH (ref 3.87–5.11)
RDW: 16.1 % — ABNORMAL HIGH (ref 11.5–15.5)
WBC: 8.1 10*3/uL (ref 4.0–10.5)
nRBC: 0 % (ref 0.0–0.2)

## 2019-06-11 LAB — COMPREHENSIVE METABOLIC PANEL
ALT: 22 U/L (ref 0–44)
AST: 20 U/L (ref 15–41)
Albumin: 4.4 g/dL (ref 3.5–5.0)
Alkaline Phosphatase: 79 U/L (ref 38–126)
Anion gap: 13 (ref 5–15)
BUN: 16 mg/dL (ref 6–20)
CO2: 23 mmol/L (ref 22–32)
Calcium: 9.6 mg/dL (ref 8.9–10.3)
Chloride: 104 mmol/L (ref 98–111)
Creatinine, Ser: 1.05 mg/dL — ABNORMAL HIGH (ref 0.44–1.00)
GFR calc Af Amer: >60 mL/min (ref >60)
GFR calc non Af Amer: 59 mL/min — ABNORMAL LOW (ref >60)
Glucose, Bld: 120 mg/dL — ABNORMAL HIGH (ref 70–99)
Potassium: 3.4 mmol/L — ABNORMAL LOW (ref 3.5–5.1)
Sodium: 140 mmol/L (ref 135–145)
Total Bilirubin: 0.7 mg/dL (ref 0.3–1.2)
Total Protein: 8.3 g/dL — ABNORMAL HIGH (ref 6.5–8.1)

## 2019-06-11 LAB — CREATININE, SERUM
Creatinine, Ser: 1.12 mg/dL — ABNORMAL HIGH (ref 0.44–1.00)
GFR calc Af Amer: >60 mL/min (ref >60)
GFR calc non Af Amer: 54 mL/min — ABNORMAL LOW (ref >60)

## 2019-06-11 LAB — CBC
HCT: 41.9 % (ref 36.0–46.0)
Hemoglobin: 14.1 g/dL (ref 12.0–15.0)
MCH: 28.7 pg (ref 26.0–34.0)
MCHC: 33.7 g/dL (ref 30.0–36.0)
MCV: 85.3 fL (ref 80.0–100.0)
Platelets: 229 10*3/uL (ref 150–400)
RBC: 4.91 MIL/uL (ref 3.87–5.11)
RDW: 16 % — ABNORMAL HIGH (ref 11.5–15.5)
WBC: 7.9 10*3/uL (ref 4.0–10.5)
nRBC: 0 % (ref 0.0–0.2)

## 2019-06-11 LAB — CK TOTAL AND CKMB (NOT AT ARMC)
CK, MB: 5.4 ng/mL — ABNORMAL HIGH (ref 0.5–5.0)
CK, MB: 5.1 ng/mL — ABNORMAL HIGH (ref 0.5–5.0)
Relative Index: INVALID (ref 0.0–2.5)
Relative Index: 5.1 — ABNORMAL HIGH (ref 0.0–2.5)
Total CK: 98 U/L (ref 38–234)
Total CK: 100 U/L (ref 38–234)

## 2019-06-11 LAB — SARS CORONAVIRUS 2 BY RT PCR (HOSPITAL ORDER, PERFORMED IN ~~LOC~~ HOSPITAL LAB): SARS Coronavirus 2: NEGATIVE

## 2019-06-11 LAB — TROPONIN I (HIGH SENSITIVITY)
Troponin I (High Sensitivity): 43 ng/L — ABNORMAL HIGH (ref <18)
Troponin I (High Sensitivity): 33 ng/L — ABNORMAL HIGH (ref <18)

## 2019-06-11 MED ORDER — ENOXAPARIN SODIUM 100 MG/ML ~~LOC~~ SOLN
95.0000 mg | SUBCUTANEOUS | Status: AC
  Administered 2019-06-11: 95 mg via SUBCUTANEOUS
  Filled 2019-06-11: qty 0.95

## 2019-06-11 MED ORDER — HYDRALAZINE HCL 20 MG/ML IJ SOLN
10.0000 mg | Freq: Four times a day (QID) | INTRAMUSCULAR | Status: DC | PRN
  Administered 2019-06-12 (×2): 10 mg via INTRAVENOUS
  Filled 2019-06-11 (×2): qty 1

## 2019-06-11 MED ORDER — ALPRAZOLAM 0.25 MG PO TABS
0.2500 mg | ORAL_TABLET | Freq: Three times a day (TID) | ORAL | Status: DC | PRN
  Administered 2019-06-12 – 2019-06-20 (×4): 0.25 mg via ORAL
  Filled 2019-06-11 (×5): qty 1

## 2019-06-11 MED ORDER — TINIDAZOLE 500 MG PO TABS
1000.0000 mg | ORAL_TABLET | Freq: Every day | ORAL | Status: AC
  Administered 2019-06-11 – 2019-06-15 (×5): 1000 mg via ORAL
  Filled 2019-06-11 (×5): qty 2

## 2019-06-11 MED ORDER — NEOMYCIN-POLYMYXIN-DEXAMETH 3.5-10000-0.1 OP SUSP
1.0000 [drp] | Freq: Two times a day (BID) | OPHTHALMIC | Status: DC
  Administered 2019-06-11 – 2019-06-19 (×15): 1 [drp] via OPHTHALMIC
  Filled 2019-06-11 (×5): qty 5

## 2019-06-11 MED ORDER — ALBUTEROL SULFATE HFA 108 (90 BASE) MCG/ACT IN AERS
2.0000 | INHALATION_SPRAY | RESPIRATORY_TRACT | Status: DC | PRN
  Filled 2019-06-11: qty 6.7

## 2019-06-11 MED ORDER — PANTOPRAZOLE SODIUM 40 MG PO TBEC
40.0000 mg | DELAYED_RELEASE_TABLET | Freq: Two times a day (BID) | ORAL | Status: DC
  Administered 2019-06-11 – 2019-06-20 (×17): 40 mg via ORAL
  Filled 2019-06-11 (×18): qty 1

## 2019-06-11 MED ORDER — NITROGLYCERIN 0.4 MG SL SUBL
0.4000 mg | SUBLINGUAL_TABLET | SUBLINGUAL | Status: DC | PRN
  Administered 2019-06-11 – 2019-06-19 (×6): 0.4 mg via SUBLINGUAL
  Filled 2019-06-11 (×4): qty 1

## 2019-06-11 MED ORDER — NEBIVOLOL HCL 10 MG PO TABS
20.0000 mg | ORAL_TABLET | Freq: Every day | ORAL | Status: DC
  Administered 2019-06-12 – 2019-06-20 (×8): 20 mg via ORAL
  Filled 2019-06-11 (×5): qty 2
  Filled 2019-06-11: qty 4
  Filled 2019-06-11: qty 2
  Filled 2019-06-11: qty 4
  Filled 2019-06-11 (×4): qty 2
  Filled 2019-06-11 (×2): qty 4

## 2019-06-11 MED ORDER — DICYCLOMINE HCL 10 MG PO CAPS
20.0000 mg | ORAL_CAPSULE | Freq: Three times a day (TID) | ORAL | Status: DC
  Administered 2019-06-11 – 2019-06-20 (×21): 20 mg via ORAL
  Filled 2019-06-11 (×26): qty 2

## 2019-06-11 MED ORDER — FLUTICASONE FUROATE-VILANTEROL 200-25 MCG/INH IN AEPB
1.0000 | INHALATION_SPRAY | Freq: Every day | RESPIRATORY_TRACT | Status: DC
  Administered 2019-06-13 – 2019-06-20 (×7): 1 via RESPIRATORY_TRACT
  Filled 2019-06-11 (×3): qty 28

## 2019-06-11 MED ORDER — POTASSIUM CHLORIDE CRYS ER 20 MEQ PO TBCR
40.0000 meq | EXTENDED_RELEASE_TABLET | Freq: Two times a day (BID) | ORAL | Status: DC
  Administered 2019-06-11 – 2019-06-16 (×11): 40 meq via ORAL
  Filled 2019-06-11 (×12): qty 2

## 2019-06-11 MED ORDER — OXYCODONE-ACETAMINOPHEN 10-325 MG PO TABS: 1.0000 | ORAL_TABLET | Freq: Four times a day (QID) | ORAL | Status: DC | PRN

## 2019-06-11 MED ORDER — ATORVASTATIN CALCIUM 40 MG PO TABS
40.0000 mg | ORAL_TABLET | Freq: Every day | ORAL | Status: DC
  Administered 2019-06-12 – 2019-06-18 (×6): 40 mg via ORAL
  Filled 2019-06-11 (×7): qty 1

## 2019-06-11 MED ORDER — SODIUM CHLORIDE 0.9 % IV SOLN
INTRAVENOUS | Status: DC
  Administered 2019-06-11 – 2019-06-12 (×3): via INTRAVENOUS

## 2019-06-11 MED ORDER — ASPIRIN EC 325 MG PO TBEC
325.0000 mg | DELAYED_RELEASE_TABLET | Freq: Every day | ORAL | Status: DC
  Administered 2019-06-12 – 2019-06-18 (×6): 325 mg via ORAL
  Filled 2019-06-11 (×7): qty 1

## 2019-06-11 MED ORDER — ZOLPIDEM TARTRATE 5 MG PO TABS
5.0000 mg | ORAL_TABLET | Freq: Every evening | ORAL | Status: DC | PRN
  Administered 2019-06-11 – 2019-06-19 (×8): 5 mg via ORAL
  Filled 2019-06-11 (×8): qty 1

## 2019-06-11 MED ORDER — LORATADINE 10 MG PO TABS
10.0000 mg | ORAL_TABLET | Freq: Every day | ORAL | Status: DC
  Administered 2019-06-12 – 2019-06-19 (×8): 10 mg via ORAL
  Filled 2019-06-11 (×9): qty 1

## 2019-06-11 MED ORDER — AMLODIPINE BESYLATE 10 MG PO TABS
10.0000 mg | ORAL_TABLET | Freq: Every day | ORAL | Status: DC
  Administered 2019-06-12 – 2019-06-14 (×3): 10 mg via ORAL
  Filled 2019-06-11: qty 2
  Filled 2019-06-11 (×2): qty 1

## 2019-06-11 MED ORDER — NITROGLYCERIN 0.4 MG SL SUBL
SUBLINGUAL_TABLET | SUBLINGUAL | Status: AC
  Filled 2019-06-11: qty 1

## 2019-06-11 MED ORDER — ENOXAPARIN SODIUM 100 MG/ML ~~LOC~~ SOLN
1.0000 mg/kg | Freq: Two times a day (BID) | SUBCUTANEOUS | Status: DC
  Administered 2019-06-12 (×2): 95 mg via SUBCUTANEOUS
  Filled 2019-06-11 (×2): qty 0.95
  Filled 2019-06-11: qty 1

## 2019-06-11 MED ORDER — CLOPIDOGREL BISULFATE 75 MG PO TABS
75.0000 mg | ORAL_TABLET | Freq: Every day | ORAL | Status: DC
  Administered 2019-06-12: 75 mg via ORAL
  Filled 2019-06-11: qty 1

## 2019-06-11 MED ORDER — OXYCODONE-ACETAMINOPHEN 5-325 MG PO TABS
1.0000 | ORAL_TABLET | Freq: Four times a day (QID) | ORAL | Status: DC | PRN
  Administered 2019-06-11 – 2019-06-20 (×15): 1 via ORAL
  Filled 2019-06-11 (×16): qty 1

## 2019-06-11 MED ORDER — ONDANSETRON HCL 4 MG/2ML IJ SOLN
4.0000 mg | Freq: Four times a day (QID) | INTRAMUSCULAR | Status: DC | PRN
  Administered 2019-06-12: 4 mg via INTRAVENOUS
  Filled 2019-06-11: qty 2

## 2019-06-11 MED ORDER — OXYCODONE HCL 5 MG PO TABS
5.0000 mg | ORAL_TABLET | Freq: Four times a day (QID) | ORAL | Status: DC | PRN
  Administered 2019-06-11 – 2019-06-19 (×12): 5 mg via ORAL
  Filled 2019-06-11 (×12): qty 1

## 2019-06-11 MED ORDER — ACETAMINOPHEN 325 MG PO TABS
650.0000 mg | ORAL_TABLET | ORAL | Status: DC | PRN
  Administered 2019-06-16: 650 mg via ORAL
  Filled 2019-06-11: qty 2

## 2019-06-11 MED ORDER — ALBUTEROL SULFATE (2.5 MG/3ML) 0.083% IN NEBU
2.5000 mg | INHALATION_SOLUTION | RESPIRATORY_TRACT | Status: DC | PRN
  Administered 2019-06-12 (×2): 2.5 mg via RESPIRATORY_TRACT
  Filled 2019-06-11 (×3): qty 3

## 2019-06-11 NOTE — Progress Notes (Signed)
Patient discussed with ER staff. History of CAD, presents with DOE. EKG nonacute, mild nonspecific troponin. Have asked patient be admitted to [facility] team, cardiology to follow as consult. We will see tomorrow on rounds, if issues today please give [place] a call back   Zandra Abts MD

## 2019-06-11 NOTE — ED Triage Notes (Signed)
She c/o increased wheezing x 1-2 days. She states she has some issues with wheezing/shob daily. Her skin is normal, warm and dry. She is in no distress and in good spirits. EKG/CXR performed at triage.

## 2019-06-11 NOTE — ED Notes (Signed)
ED TO INPATIENT HANDOFF REPORT  ED Nurse Name and Phone #: [phone]   S Name/Age/Gender Tracey Manning 58 y.o. female Room/Bed: WA02/WA02  Code Status   Code Status: Full Code  Home/SNF/Other Home Patient oriented to: self, place, time and situation Is this baseline? Yes   Triage Complete: Triage complete  Chief Complaint SOB  Triage Note She c/o increased wheezing x 1-2 days. She states she has some issues with wheezing/shob daily. Her skin is normal, warm and dry. She is in no distress and in good spirits. EKG/CXR performed at triage.   Allergies Allergies  Allergen Reactions  . Other Anaphylaxis and Hives    Fresh strawberries (throat swelled)  . Amoxicillin Nausea And Vomiting    Did it involve swelling of the face/tongue/throat, SOB, or low BP? Yes Did it involve sudden or severe rash/hives, skin peeling, or any reaction on the inside of your mouth or nose? Yes Did you need to seek medical attention at a hospital or doctor's office? Yes When did it last happen?within last 10 years If all above answers are "NO", may proceed with cephalosporin use.   . Dilaudid [Hydromorphone Hcl] Nausea And Vomiting  . Ibuprofen Other (See Comments)    wheezing  . Latex Swelling    No reaction with bandaids    Level of Care/Admitting Diagnosis ED Disposition    ED Disposition Condition [facility] Area: [place] [100102]  Level of Care: Telemetry [5]  Admit to tele based on following criteria: Monitor for Ischemic changes  Covid Evaluation: N/A  Diagnosis: Chest pain [443154]  Admitting Physician: Deatra James [[id number]]  Attending Physician: Deatra James [phone]  PT Class (Do Not Modify): Observation [104]  PT Acc Code (Do Not Modify): Observation [10022]       B Medical/Surgery History Past Medical History:  Diagnosis Date  . Anxiety   . Arthritis   . Asthma   . Barrett's esophagus   . Chest pain   .  Collagen vascular disease ([place])   . Colon polyps   . Coronary artery disease   . Depression   . Dyslipidemia   . GERD (gastroesophageal reflux disease)    occ  . Hypertension   . Myocardial infarct ([place]) 2671,2458  . Sciatica   . Sleep apnea    no CPAP ordered but using oxygen at bedtime  . Tobacco abuse    Past Surgical History:  Procedure Laterality Date  . CARDIAC CATHETERIZATION  10/09/2003   [facility], [place]) - LAD with 30% prox narrowing, 50% stenosis in mid-portion of PLA; RCA with 40% narrowing proximally ([name], [name])  . CARDIAC CATHETERIZATION  10/10/2007   70% stenosis in first septal perforator branch of LAD, 60-70% narrowing in mid LAD, 20% narrowing in mid AV groove Cfx with 80% diffuse narrowing in small distal marginal, total occlusion of mid RCA with L to R collaterals, 90% stenosis diffusely in prox branch of RCA followed by 70% stenosis in secondary curve & 80% stenosis in small marginal branch ([name])  . CARDIAC CATHETERIZATION  05/28/2008   normal L main, RCA with 100% prox lesion w/distal filling from LAD collaterals, LAD with 20% prox tubular lesion/ 40% mid LAD lesion/previous stent patent ([name])  . CARDIAC CATHETERIZATION  09/15/2009   discrete 100% osital RCA lesion, 50% prox LAD lesion, non-obstructive disease in all coronaries ([name])  . CESAREAN SECTION    . COLONOSCOPY    .  COLONOSCOPY W/ POLYPECTOMY    . CORONARY ANGIOPLASTY WITH STENT PLACEMENT  10/31/2007   PCI of distal Cfx with 2.25x48mm Taxus Adam DES, 60% narrowing of mid LAD ([name])  . CORONARY ANGIOPLASTY WITH STENT PLACEMENT  06/11/2011   PCI of prox-mid LAD with 3x54mm DES Resolute ([name])  . DILATION AND CURETTAGE, DIAGNOSTIC / THERAPEUTIC    . DILITATION & CURRETTAGE/HYSTROSCOPY WITH HYDROTHERMAL ABLATION N/A 04/03/2016   Procedure: DILATATION & CURETTAGE/HYSTEROSCOPY WITH HYDROTHERMAL ABLATION;  Surgeon: [name], MD;   Location: [facility];  Service: Gynecology;  Laterality: N/A;  . NM MYOCAR PERF WALL MOTION  08/2009   persantine myoview - normal perfusion in all regions, perfusion defect in anterior region (breast attenuation), EF 52%, low risk scan     A IV Location/Drains/Wounds Patient Lines/Drains/Airways Status   Active Line/Drains/Airways    Name:   Placement date:   Placement time:   Site:   Days:   Peripheral IV 06/11/19 Left;Posterior Hand   06/11/19    0925    Hand   less than 1   Peripheral IV 06/11/19 Right Antecubital   06/11/19    1216    Antecubital   less than 1   Incision (Closed) 04/03/16 Vagina Other (Comment)   04/03/16    1338     1164          Intake/Output Last 24 hours No intake or output data in the 24 hours ending 06/11/19 1631  Labs/Imaging Results for orders placed or performed during the hospital encounter of 06/11/19 (from the past 48 hour(s))  CBC with Differential/Platelet     Status: Abnormal   Collection Time: 06/11/19 12:14 PM  Result Value Ref Range   WBC 8.1 4.0 - 10.5 K/uL   RBC 5.16 (H) 3.87 - 5.11 MIL/uL   Hemoglobin 14.2 12.0 - 15.0 g/dL   HCT 44.3 36.0 - 46.0 %   MCV 85.9 80.0 - 100.0 fL   MCH 27.5 26.0 - 34.0 pg   MCHC 32.1 30.0 - 36.0 g/dL   RDW 16.1 (H) 11.5 - 15.5 %   Platelets 232 150 - 400 K/uL   nRBC 0.0 0.0 - 0.2 %   Neutrophils Relative % 89 %   Neutro Abs 7.2 1.7 - 7.7 K/uL   Lymphocytes Relative 8 %   Lymphs Abs 0.6 (L) 0.7 - 4.0 K/uL   Monocytes Relative 2 %   Monocytes Absolute 0.2 0.1 - 1.0 K/uL   Eosinophils Relative 1 %   Eosinophils Absolute 0.0 0.0 - 0.5 K/uL   Basophils Relative 0 %   Basophils Absolute 0.0 0.0 - 0.1 K/uL   Immature Granulocytes 0 %   Abs Immature Granulocytes 0.03 0.00 - 0.07 K/uL    Comment: Performed at [facility], [place] [address] [place], [place] [address]  Comprehensive metabolic panel     Status: Abnormal   Collection Time: 06/11/19 12:14 PM  Result Value Ref Range   Sodium 140  135 - 145 mmol/L   Potassium 3.4 (L) 3.5 - 5.1 mmol/L   Chloride 104 98 - 111 mmol/L   CO2 23 22 - 32 mmol/L   Glucose, Bld 120 (H) 70 - 99 mg/dL   BUN 16 6 - 20 mg/dL   Creatinine, Ser 1.05 (H) 0.44 - 1.00 mg/dL   Calcium 9.6 8.9 - 10.3 mg/dL   Total Protein 8.3 (H) 6.5 - 8.1 g/dL   Albumin 4.4 3.5 - 5.0 g/dL  AST 20 15 - 41 U/L   ALT 22 0 - 44 U/L   Alkaline Phosphatase 79 38 - 126 U/L   Total Bilirubin 0.7 0.3 - 1.2 mg/dL   GFR calc non Af Amer 59 (L) >60 mL/min   GFR calc Af Amer >60 >60 mL/min   Anion gap 13 5 - 15    Comment: Performed at [facility], [place] [address] [place], [place] [address]  Troponin I (High Sensitivity)     Status: Abnormal   Collection Time: 06/11/19 12:14 PM  Result Value Ref Range   Troponin I (High Sensitivity) 43 (H) <18 ng/L    Comment: (NOTE) Elevated high sensitivity troponin I (hsTnI) values and significant  changes across serial measurements may suggest ACS but many other  chronic and acute conditions are known to elevate hsTnI results.  Refer to the "Links" section for chest pain algorithms and additional  guidance. Performed at [facility], [place] [address], [place], [place] [address]   SARS Coronavirus 2 (CEPHEID- Performed in [place] hospital lab), Hosp Order     Status: None   Collection Time: 06/11/19 12:14 PM   Specimen: Nasopharyngeal Swab  Result Value Ref Range   SARS Coronavirus 2 NEGATIVE NEGATIVE    Comment: (NOTE) If result is NEGATIVE SARS-CoV-2 target nucleic acids are NOT DETECTED. The SARS-CoV-2 RNA is generally detectable in upper and lower  respiratory specimens during the acute phase of infection. The lowest  concentration of SARS-CoV-2 viral copies this assay can detect is 250  copies / mL. A negative result does not preclude SARS-CoV-2 infection  and should not be used as the sole basis for treatment or other  patient management decisions.  A negative result may occur  with  improper specimen collection / handling, submission of specimen other  than nasopharyngeal swab, presence of viral mutation(s) within the  areas targeted by this assay, and inadequate number of viral copies  (<250 copies / mL). A negative result must be combined with clinical  observations, patient history, and epidemiological information. If result is POSITIVE SARS-CoV-2 target nucleic acids are DETECTED. The SARS-CoV-2 RNA is generally detectable in upper and lower  respiratory specimens dur ing the acute phase of infection.  Positive  results are indicative of active infection with SARS-CoV-2.  Clinical  correlation with patient history and other diagnostic information is  necessary to determine patient infection status.  Positive results do  not rule out bacterial infection or co-infection with other viruses. If result is PRESUMPTIVE POSTIVE SARS-CoV-2 nucleic acids MAY BE PRESENT.   A presumptive positive result was obtained on the submitted specimen  and confirmed on repeat testing.  While 2019 novel coronavirus  (SARS-CoV-2) nucleic acids may be present in the submitted sample  additional confirmatory testing may be necessary for epidemiological  and / or clinical management purposes  to differentiate between  SARS-CoV-2 and other Sarbecovirus currently known to infect humans.  If clinically indicated additional testing with an alternate test  methodology [phone]) is advised. The SARS-CoV-2 RNA is generally  detectable in upper and lower respiratory sp ecimens during the acute  phase of infection. The expected result is Negative. Fact Sheet for Patients:  StrictlyIdeas.no Fact Sheet for Healthcare Providers: BankingDealers.co.za This test is not yet approved or cleared by the [place] FDA and has been authorized for detection and/or diagnosis of SARS-CoV-2 by FDA under an Emergency Use Authorization (EUA).  This EUA  will remain in effect (meaning this  test can be used) for the duration of the COVID-19 declaration under Section 564(b)(1) of the Act, 21 U.S.C. section 360bbb-3(b)(1), unless the authorization is terminated or revoked sooner. Performed at [facility], [place] [address], [place], [place] [address]   CBC     Status: Abnormal   Collection Time: 06/11/19  3:12 PM  Result Value Ref Range   WBC 7.9 4.0 - 10.5 K/uL   RBC 4.91 3.87 - 5.11 MIL/uL   Hemoglobin 14.1 12.0 - 15.0 g/dL   HCT 41.9 36.0 - 46.0 %   MCV 85.3 80.0 - 100.0 fL   MCH 28.7 26.0 - 34.0 pg   MCHC 33.7 30.0 - 36.0 g/dL   RDW 16.0 (H) 11.5 - 15.5 %   Platelets 229 150 - 400 K/uL   nRBC 0.0 0.0 - 0.2 %    Comment: Performed at [facility], [place] [address] [place], [place] [address]  Creatinine, serum     Status: Abnormal   Collection Time: 06/11/19  3:12 PM  Result Value Ref Range   Creatinine, Ser 1.12 (H) 0.44 - 1.00 mg/dL   GFR calc non Af Amer 54 (L) >60 mL/min   GFR calc Af Amer >60 >60 mL/min    Comment: Performed at [facility], [place] [address], [place], [place] [address]   Dg Chest 2 View  Result Date: 06/11/2019 CLINICAL DATA:  Increased wheezing for 1-2 days. EXAM: CHEST - 2 VIEW COMPARISON:  Chest x-ray dated 09/05/2018. FINDINGS: Borderline cardiomegaly, stable. Overall cardiomediastinal silhouette is stable. Lungs are clear. No pleural effusion or pneumothorax seen. Osseous structures about the chest are unremarkable. IMPRESSION: No active cardiopulmonary disease. No evidence of pneumonia or pulmonary edema. Electronically Signed   By: Franki Cabot M.D.   On: 06/11/2019 11:15    Pending Labs Unresulted Labs (From admission, onward)    Start     Ordered   06/18/19 0500  Creatinine, serum  (enoxaparin (LOVENOX) full dose)  Weekly,   R    Comments: while on enoxaparin therapy.    06/11/19 1504   06/12/19 3716  Basic metabolic panel  Daily,   R      06/11/19 1507   06/11/19 1507  Troponin T  Now then every 3 hours,   R (with STAT occurrences)     06/11/19 1507   06/11/19 1507  CK total and CKMB (cardiac)not at [facility]  Now then every 3 hours,   R (with STAT occurrences)     06/11/19 1507   06/11/19 1501  HIV antibody (Routine Testing)  Once,   STAT     06/11/19 1504          Vitals/Pain Today's Vitals   06/11/19 1137 06/11/19 1139 06/11/19 1515 06/11/19 1520  BP: (!) 152/137 (!) 159/100    Pulse: 82 82 95 (!) 103  Resp: 17 15 (!) 30 14  Temp:      TempSrc:      SpO2: 99% 99% 100% 100%  PainSc:        Isolation Precautions Airborne and Contact precautions  Medications Medications  albuterol (VENTOLIN HFA) 108 (90 Base) MCG/ACT inhaler 2 puff (has no administration in time range)  0.9 %  sodium chloride infusion (has no administration in time range)  acetaminophen (TYLENOL) tablet 650 mg (has no administration in time range)  ondansetron (ZOFRAN) injection 4 mg (has no administration in time range)  enoxaparin (LOVENOX) injection 95 mg (has no administration in time  range)  ALPRAZolam (XANAX) tablet 0.25 mg (has no administration in time range)  potassium chloride SA (K-DUR) CR tablet 40 mEq (has no administration in time range)  enoxaparin (LOVENOX) injection 95 mg (has no administration in time range)  dicyclomine (BENTYL) tablet 20 mg (has no administration in time range)  oxyCODONE-acetaminophen (PERCOCET) 10-325 MG per tablet 1 tablet (has no administration in time range)  aspirin EC tablet 325 mg (has no administration in time range)  tinidazole (TINDAMAX) tablet 1,000 mg (has no administration in time range)  amLODipine (NORVASC) tablet 10 mg (has no administration in time range)  atorvastatin (LIPITOR) tablet 40 mg (has no administration in time range)  Nebivolol HCl TABS 20 mg (has no administration in time range)  nitroGLYCERIN (NITROSTAT) SL tablet 0.4 mg (has no administration in time range)  zolpidem (AMBIEN)  tablet 10 mg (has no administration in time range)  pantoprazole (PROTONIX) EC tablet 40 mg (has no administration in time range)  clopidogrel (PLAVIX) tablet 75 mg (has no administration in time range)  fluticasone furoate-vilanterol (BREO ELLIPTA) 200-25 MCG/INH 1 puff (1 puff Inhalation Not Given 06/11/19 1630)  loratadine (CLARITIN) tablet 10 mg (has no administration in time range)  neomycin-polymyxin b-dexamethasone (MAXITROL) ophthalmic suspension 1 drop (has no administration in time range)    Mobility walks Low fall risk   Focused Assessments Pulmonary Assessment Handoff:  Lung sounds:   O2 Device: Room Air     ,     R Recommendations: See Admitting Provider Note  Report given to:   Additional Notes:

## 2019-06-11 NOTE — ED Provider Notes (Signed)
[facility] DEPT Provider Note   CSN: [id number] Arrival date & time: 06/11/19  4098     History   Chief Complaint No chief complaint on file.   HPI Tracey Manning is a 58 y.o. female.     Patient with shortness of breath on exertion.  Patient has had a stent placed in 2004 2008 and 2012.  She also has a history of COPD.  The stents were for cardiac disease  The history is provided by the patient. No language interpreter was used.  Shortness of Breath Severity:  Moderate Onset quality:  Sudden Timing:  Constant Progression:  Worsening Chronicity:  Recurrent Relieved by:  Nothing Worsened by:  Nothing Ineffective treatments:  None tried Associated symptoms: no abdominal pain, no chest pain, no cough, no headaches and no rash     Past Medical History:  Diagnosis Date  . Anxiety   . Arthritis   . Asthma   . Barrett's esophagus   . Chest pain   . Collagen vascular disease ([place])   . Colon polyps   . Coronary artery disease   . Depression   . Dyslipidemia   . GERD (gastroesophageal reflux disease)    occ  . Hypertension   . Myocardial infarct ([place]) 1191,4782  . Sciatica   . Sleep apnea    no CPAP ordered but using oxygen at bedtime  . Tobacco abuse     Patient Active Problem List   Diagnosis Date Noted  . Abnormal uterine bleeding (AUB) 04/03/2016  . Dysfunctional uterine bleeding 11/14/2015  . Bacterial vaginosis (recurrent) 11/14/2015  . Chest pain 11/14/2015  . Essential hypertension 04/04/2014  . Hyperlipidemia 04/04/2014  . Coronary artery disease 04/04/2014    Past Surgical History:  Procedure Laterality Date  . CARDIAC CATHETERIZATION  10/09/2003   [place], [place]) - LAD with 30% prox narrowing, 50% stenosis in mid-portion of PLA; RCA with 40% narrowing proximally ([name], [name])  . CARDIAC CATHETERIZATION  10/10/2007   70% stenosis in first septal perforator branch of LAD, 60-70% narrowing  in mid LAD, 20% narrowing in mid AV groove Cfx with 80% diffuse narrowing in small distal marginal, total occlusion of mid RCA with L to R collaterals, 90% stenosis diffusely in prox branch of RCA followed by 70% stenosis in secondary curve & 80% stenosis in small marginal branch ([name])  . CARDIAC CATHETERIZATION  05/28/2008   normal L main, RCA with 100% prox lesion w/distal filling from LAD collaterals, LAD with 20% prox tubular lesion/ 40% mid LAD lesion/previous stent patent ([name])  . CARDIAC CATHETERIZATION  09/15/2009   discrete 100% osital RCA lesion, 50% prox LAD lesion, non-obstructive disease in all coronaries ([name])  . CESAREAN SECTION    . COLONOSCOPY    . COLONOSCOPY W/ POLYPECTOMY    . CORONARY ANGIOPLASTY WITH STENT PLACEMENT  10/31/2007   PCI of distal Cfx with 2.25x32m Taxus Adam DES, 60% narrowing of mid LAD (Dr. TCorky Downs  . CORONARY ANGIOPLASTY WITH STENT PLACEMENT  06/11/2011   PCI of prox-mid LAD with 3x139mDES Resolute (Dr. T.[name] . DILATION AND CURETTAGE, DIAGNOSTIC / THERAPEUTIC    . DILITATION & CURRETTAGE/HYSTROSCOPY WITH HYDROTHERMAL ABLATION N/A 04/03/2016   Procedure: DILATATION & CURETTAGE/HYSTEROSCOPY WITH HYDROTHERMAL ABLATION;  Surgeon: ChShelly BombardMD;  [facility];  Service: Gynecology;  Laterality: N/A;  . NM MYOCAR PERF WALL MOTION  08/2009   persantine myoview - normal  perfusion in all regions, perfusion defect in anterior region (breast attenuation), EF 52%, low risk scan     OB History    Gravida  8   Para      Term      Preterm      AB  6   Living  2     SAB  6   TAB      Ectopic      Multiple      Live Births  2            Home Medications    Prior to Admission medications   Medication Sig Start Date End Date Taking? Authorizing Provider  albuterol (PROVENTIL HFA;VENTOLIN HFA) 108 (90 BASE) MCG/ACT inhaler Inhale 2 puffs into the lungs every 6 (six) hours as needed for wheezing or  shortness of breath.   Yes [provider]  albuterol (PROVENTIL) (2.5 MG/3ML) 0.083% nebulizer solution Take 6 mLs by nebulization every 6 (six) hours as needed for wheezing or shortness of breath. 03/27/19  Yes [provider]  amLODipine (NORVASC) 10 MG tablet Take 10 mg by mouth daily.   Yes [provider]  aspirin EC 325 MG tablet Take 325 mg by mouth daily.   Yes [provider]  atorvastatin (LIPITOR) 40 MG tablet Take 1 tablet (40 mg total) by mouth daily. 01/04/14  Yes [name], MD  cetirizine (ZYRTEC) 10 MG tablet Take 10 mg by mouth daily as needed for allergies. 05/04/19  Yes [provider]  clopidogrel (PLAVIX) 75 MG tablet Take 1 tablet (75 mg total) by mouth daily with breakfast. 01/14/18  Yes [name], MD  fluticasone-salmeterol (ADVAIR HFA) 115-21 MCG/ACT inhaler Inhale 2 puffs into the lungs 2 (two) times daily. 01/04/14  Yes [name], MD  lisinopril-hydrochlorothiazide (PRINZIDE,ZESTORETIC) 20-12.5 MG per tablet Take 1 tablet by mouth 2 (two) times daily.    Yes [provider]  methocarbamol (ROBAXIN) 500 MG tablet Take 1 tablet (500 mg total) by mouth 2 (two) times daily. 01/02/19  Yes Hedges, Dellis Filbert, PA-C  NARCAN 4 MG/0.1ML LIQD nasal spray kit Place 1 spray into the nose once.  09/01/18  Yes [provider]  Nebivolol HCl (BYSTOLIC) 20 MG TABS Take 20 mg by mouth daily.    Yes [provider]  neomycin-polymyxin b-dexamethasone (MAXITROL) 3.5-10000-0.1 SUSP Place 1 drop into the left eye 2 (two) times daily. 09/09/18  Yes [provider]  nitroGLYCERIN (NITROSTAT) 0.4 MG SL tablet Place 1 tablet (0.4 mg total) under the tongue every 5 (five) minutes as needed for chest pain. 01/14/18  Yes [name], MD  oxyCODONE-acetaminophen (PERCOCET) 10-325 MG tablet Take 1 tablet by mouth every 4 (four) hours as needed for pain. 06/05/19  Yes [name], MD  pantoprazole  (PROTONIX) 20 MG tablet Take 2 tablets (40 mg total) by mouth 2 (two) times daily. 04/27/18  Yes Armbruster, Carlota Raspberry, MD  tinidazole (TINDAMAX) 500 MG tablet Take 2 tablets (1,000 mg total) by mouth daily with breakfast. 06/07/19  Yes [name], MD  Vitamin D, Ergocalciferol, (DRISDOL) 1.25 MG (50000 UT) CAPS capsule Take 50,000 Units by mouth once a week. 09/23/18  Yes [provider]  zolpidem (AMBIEN) 10 MG tablet Take 10 mg by mouth at bedtime as needed for sleep.   Yes [provider]  dicyclomine (BENTYL) 20 MG tablet Take 1 tablet (20 mg total) by mouth 3 (three) times daily before meals.  Patient not taking: Reported on 06/11/2019 05/04/18   Carlisle Cater, PA-C  predniSONE (DELTASONE) 20 MG tablet Take 2 tablets (40 mg total) by mouth daily. Take 40 mg by mouth daily for 3 days, then 31m by mouth daily for 3 days, then 169mdaily for 3 days Patient not taking: Reported on 06/11/2019 01/17/19   BrMontine CirclePA-C    Family History Family History  Problem Relation Age of Onset  . Leukemia Mother   . Clotting disorder Father        blood clot  . Hypertension Sister   . Diabetes Sister   . Stroke Sister 4083. Bleeding Disorder Son        ITP "free bleeding disorder"  . Colon cancer Paternal Grandmother   . Diabetes Paternal Grandmother   . Stomach cancer Neg Hx   . Pancreatic cancer Neg Hx     Social History Social History   Tobacco Use  . Smoking status: Current Every Day Smoker    Packs/day: 0.50    Years: 0.00    Pack years: 0.00    Types: Cigarettes  . Smokeless tobacco: Never Used  Substance Use Topics  . Alcohol use: No    Alcohol/week: 0.0 standard drinks    Comment: seldom  . Drug use: Yes    Types: Marijuana    Comment: used last night     Allergies   Other, Amoxicillin, Dilaudid [hydromorphone hcl], Ibuprofen, and Latex   Review of Systems Review of Systems  Constitutional: Negative for appetite change and fatigue.  HENT:  Negative for congestion, ear discharge and sinus pressure.   Eyes: Negative for discharge.  Respiratory: Positive for shortness of breath. Negative for cough.   Cardiovascular: Negative for chest pain.  Gastrointestinal: Negative for abdominal pain and diarrhea.  Genitourinary: Negative for frequency and hematuria.  Musculoskeletal: Negative for back pain.  Skin: Negative for rash.  Neurological: Negative for seizures and headaches.  Psychiatric/Behavioral: Negative for hallucinations.     Physical Exam Updated Vital Signs BP (!) 159/100   Pulse 82   Temp 97.8 F (36.6 C) (Oral)   Resp 15   LMP  (LMP Unknown) Comment: verified BEFORE imaging  SpO2 99%   Physical Exam Vitals signs and nursing note reviewed.  Constitutional:      Appearance: She is well-developed.  HENT:     Head: Normocephalic.     Nose: Nose normal.  Eyes:     General: No scleral icterus.    Conjunctiva/sclera: Conjunctivae normal.  Neck:     Musculoskeletal: Neck supple.     Thyroid: No thyromegaly.  Cardiovascular:     Rate and Rhythm: Normal rate and regular rhythm.     Heart sounds: No murmur. No friction rub. No gallop.   Pulmonary:     Breath sounds: No stridor. No wheezing or rales.  Chest:     Chest wall: No tenderness.  Abdominal:     General: There is no distension.     Tenderness: There is no abdominal tenderness. There is no rebound.  Musculoskeletal: Normal range of motion.  Lymphadenopathy:     Cervical: No cervical adenopathy.  Skin:    Findings: No erythema or rash.  Neurological:     Mental Status: She is oriented to person, place, and time.     Motor: No abnormal muscle tone.     Coordination: Coordination normal.  Psychiatric:        Behavior: Behavior normal.  ED Treatments / Results  Labs (all labs ordered are listed, but only abnormal results are displayed) Labs Reviewed  CBC WITH DIFFERENTIAL/PLATELET - Abnormal; Notable for the following components:       Result Value   RBC 5.16 (*)    RDW 16.1 (*)    Lymphs Abs 0.6 (*)    All other components within normal limits  COMPREHENSIVE METABOLIC PANEL - Abnormal; Notable for the following components:   Potassium 3.4 (*)    Glucose, Bld 120 (*)    Creatinine, Ser 1.05 (*)    Total Protein 8.3 (*)    GFR calc non Af Amer 59 (*)    All other components within normal limits  TROPONIN I (HIGH SENSITIVITY) - Abnormal; Notable for the following components:   Troponin I (High Sensitivity) 43 (*)    All other components within normal limits  SARS CORONAVIRUS 2 (HOSPITAL ORDER, [facility])  HIV ANTIBODY (ROUTINE TESTING W REFLEX)  CBC  CREATININE, SERUM  TROPONIN T  TROPONIN T  TROPONIN T  CK TOTAL AND CKMB (NOT AT [facility])  CK TOTAL AND CKMB (NOT AT [facility])  CK TOTAL AND CKMB (NOT AT [facility])  TROPONIN I (HIGH SENSITIVITY)    EKG EKG Interpretation  Date/Time:  Sunday June 11 2019 09:49:14 EDT Ventricular Rate:  86 PR Interval:    QRS Duration: 113 QT Interval:  397 QTC Calculation: 475 R Axis:   44 Text Interpretation:  Sinus rhythm Probable left atrial enlargement Probable anterior infarct, old Confirmed by Milton Ferguson [phone]) on 06/11/2019 11:56:40 AM Also confirmed by Milton Ferguson [phone])  on 06/11/2019 2:38:37 PM   Radiology Dg Chest 2 View  Result Date: 06/11/2019 CLINICAL DATA:  Increased wheezing for 1-2 days. EXAM: CHEST - 2 VIEW COMPARISON:  Chest x-ray dated 09/05/2018. FINDINGS: Borderline cardiomegaly, stable. Overall cardiomediastinal silhouette is stable. Lungs are clear. No pleural effusion or pneumothorax seen. Osseous structures about the chest are unremarkable. IMPRESSION: No active cardiopulmonary disease. No evidence of pneumonia or pulmonary edema. Electronically Signed   By: Franki Cabot M.D.   On: 06/11/2019 11:15    Procedures Procedures (including critical care time)  Medications Ordered in ED Medications  albuterol (VENTOLIN HFA)  108 (90 Base) MCG/ACT inhaler 2 puff (has no administration in time range)  0.9 %  sodium chloride infusion (has no administration in time range)  acetaminophen (TYLENOL) tablet 650 mg (has no administration in time range)  ondansetron (ZOFRAN) injection 4 mg (has no administration in time range)  enoxaparin (LOVENOX) injection 95 mg (has no administration in time range)  ALPRAZolam (XANAX) tablet 0.25 mg (has no administration in time range)  potassium chloride SA (K-DUR) CR tablet 40 mEq (has no administration in time range)  enoxaparin (LOVENOX) injection 95 mg (has no administration in time range)     Initial Impression / Assessment and Plan / ED Course  I have reviewed the triage vital signs and the nursing notes.  Pertinent labs & imaging results that were available during my care of the patient were reviewed by me and considered in my medical decision making (see chart for details).    CRITICAL CARE Performed by: Milton Ferguson Total critical care time: 40 minutes Critical care time was exclusive of separately billable procedures and treating other patients. Critical care was necessary to treat or prevent imminent or life-threatening deterioration. Critical care was time spent personally by me on the following activities: development of treatment plan with patient and/or  surrogate as well as nursing, discussions with consultants, evaluation of patient's response to treatment, examination of patient, obtaining history from patient or surrogate, ordering and performing treatments and interventions, ordering and review of laboratory studies, ordering and review of radiographic studies, pulse oximetry and re-evaluation of patient's condition.      Patient with elevated troponin.  I spoke with cardiology and they will consult on the patient.  We suspect shortness of breath is related to coronary artery disease.  She will be admitted to medicine  Final Clinical Impressions(s) / ED  Diagnoses   Final diagnoses:  SOB (shortness of breath)    ED Discharge Orders    None       Milton Ferguson, MD 06/11/19 1511

## 2019-06-11 NOTE — H&P (Addendum)
History and Physical   Patient: Tracey Manning                            PCP: Nolene Ebbs, MD                    DOB: [birth year]            DOA: 06/11/2019 SEG:[id number]             DOS: 06/11/2019, 3:13 PM  Patient coming from:   Home  I have personally reviewed patient's medical records, in electronic medical records, including: [facility], and care everywhere.    Chief Complaint:   Chest pain, mild shortness of breath  History of present illness:    Tracey Manning is a 58 y.o. female with medical history significant of CAD, with multiple stents,HTN/HLD/GERD/COPD (not O2 dependent), anxiety, presenting with shortness of breath, nonspecific chest pain. Patient reports mild shortness of breath and nonspecific chest pain is worse with exertion and rest comfortable. Aside from mild shortness of exertion denies any other upper respiratory symptoms such as cough congestion, fever or chills   Patient Denies having: Fever, Chills, Cough, SOB, Chest Pain, Abd pain, N/V/D, headache, dizziness, lightheadedness, joint pain, rash, open wounds  ED Course:   Patient was evaluated in ED, remained to be stable vitals are stable denies any chest pain at this time, troponin elevated to 43 subsequent troponin 33, no acute changes on EKG, cardiologist was notified, Dr. Harl Bowie.  Will see and evaluate the patient accordingly   Review of Systems: As per HPI, otherwise 10 point review of systems were negative.   ----------------------------------------------------------------------------------------------------------------------  Allergies  Allergen Reactions  . Other Anaphylaxis and Hives    Fresh strawberries (throat swelled)  . Amoxicillin Nausea And Vomiting    Did it involve swelling of the face/tongue/throat, SOB, or low BP? Yes Did it involve sudden or severe rash/hives, skin peeling, or any reaction on the inside of your mouth or nose? Yes Did you need to seek medical attention at a  hospital or doctor's office? Yes When did it last happen?within last 10 years If all above answers are "NO", may proceed with cephalosporin use.   . Dilaudid [Hydromorphone Hcl] Nausea And Vomiting  . Ibuprofen Other (See Comments)    wheezing  . Latex Swelling    No reaction with bandaids    Home MEDs:  Prior to Admission medications   Medication Sig Start Date End Date Taking? Authorizing Provider  albuterol (PROVENTIL HFA;VENTOLIN HFA) 108 (90 BASE) MCG/ACT inhaler Inhale 2 puffs into the lungs every 6 (six) hours as needed for wheezing or shortness of breath.   Yes [provider]  albuterol (PROVENTIL) (2.5 MG/3ML) 0.083% nebulizer solution Take 6 mLs by nebulization every 6 (six) hours as needed for wheezing or shortness of breath. 03/27/19  Yes [provider]  amLODipine (NORVASC) 10 MG tablet Take 10 mg by mouth daily.   Yes [provider]  aspirin EC 325 MG tablet Take 325 mg by mouth daily.   Yes [provider]  atorvastatin (LIPITOR) 40 MG tablet Take 1 tablet (40 mg total) by mouth daily. 01/04/14  Yes [name], MD  cetirizine (ZYRTEC) 10 MG tablet Take 10 mg by mouth daily as needed for allergies. 05/04/19  Yes [provider]  clopidogrel (PLAVIX) 75 MG tablet Take 1 tablet (75 mg total) by mouth daily with breakfast. 01/14/18  Yes [name], MD  fluticasone-salmeterol (ADVAIR HFA) [phone] MCG/ACT inhaler Inhale 2 puffs into the lungs 2 (two) times daily. 01/04/14  Yes [name], MD  lisinopril-hydrochlorothiazide (PRINZIDE,ZESTORETIC) 20-12.5 MG per tablet Take 1 tablet by mouth 2 (two) times daily.    Yes [provider]  methocarbamol (ROBAXIN) 500 MG tablet Take 1 tablet (500 mg total) by mouth 2 (two) times daily. 01/02/19  Yes Hedges, Dellis Filbert, PA-C  NARCAN 4 MG/0.1ML LIQD nasal spray kit Place 1 spray into the nose once.  09/01/18  Yes [provider]  Nebivolol HCl (BYSTOLIC) 20 MG TABS  Take 20 mg by mouth daily.    Yes [provider]  neomycin-polymyxin b-dexamethasone (MAXITROL) 3.5-10000-0.1 SUSP Place 1 drop into the left eye 2 (two) times daily. 09/09/18  Yes [provider]  nitroGLYCERIN (NITROSTAT) 0.4 MG SL tablet Place 1 tablet (0.4 mg total) under the tongue every 5 (five) minutes as needed for chest pain. 01/14/18  Yes [name], MD  oxyCODONE-acetaminophen (PERCOCET) 10-325 MG tablet Take 1 tablet by mouth every 4 (four) hours as needed for pain. 06/05/19  Yes [name], MD  pantoprazole (PROTONIX) 20 MG tablet Take 2 tablets (40 mg total) by mouth 2 (two) times daily. 04/27/18  Yes Armbruster, Carlota Raspberry, MD  tinidazole (TINDAMAX) 500 MG tablet Take 2 tablets (1,000 mg total) by mouth daily with breakfast. 06/07/19  Yes [name], MD  Vitamin D, Ergocalciferol, (DRISDOL) 1.25 MG (50000 UT) CAPS capsule Take 50,000 Units by mouth once a week. 09/23/18  Yes [provider]  zolpidem (AMBIEN) 10 MG tablet Take 10 mg by mouth at bedtime as needed for sleep.   Yes [provider]  dicyclomine (BENTYL) 20 MG tablet Take 1 tablet (20 mg total) by mouth 3 (three) times daily before meals. Patient not taking: Reported on 06/11/2019 05/04/18   Carlisle Cater, PA-C  predniSONE (DELTASONE) 20 MG tablet Take 2 tablets (40 mg total) by mouth daily. Take 40 mg by mouth daily for 3 days, then 53m by mouth daily for 3 days, then 117mdaily for 3 days Patient not taking: Reported on 06/11/2019 01/17/19   BrMontine CirclePA-C    PRN MEDs: acetaminophen, albuterol, ALPRAZolam, nitroGLYCERIN, ondansetron (ZOFRAN) IV, oxyCODONE-acetaminophen, zolpidem  Past Medical History:  Diagnosis Date  . Anxiety   . Arthritis   . Asthma   . Barrett's esophagus   . Chest pain   . Collagen vascular disease (HCDenison  . Colon polyps   . Coronary artery disease   . Depression   . Dyslipidemia   . GERD (gastroesophageal reflux disease)    occ  .  Hypertension   . Myocardial infarct ([id number]. Sciatica   . Sleep apnea    no CPAP ordered but using oxygen at bedtime  . Tobacco abuse     Past Surgical History:  Procedure Laterality Date  . CARDIAC CATHETERIZATION  10/09/2003   [place] with 30% prox narrowing, 50% stenosis in mid-portion of PLA; RCA with 40% narrowing proximally (Dr. JoOrinda KennerIII)  . CARDIAC CATHETERIZATION  10/10/2007   70% stenosis in first septal perforator branch of LAD, 60-70% narrowing in mid LAD, 20% narrowing in mid AV groove Cfx with 80% diffuse narrowing in small distal marginal, total occlusion of mid RCA with L to R collaterals, 90% stenosis diffusely in prox branch of RCA followed by 70% stenosis in secondary curve &  80% stenosis in small marginal branch ([name])  . CARDIAC CATHETERIZATION  05/28/2008   normal L main, RCA with 100% prox lesion w/distal filling from LAD collaterals, LAD with 20% prox tubular lesion/ 40% mid LAD lesion/previous stent patent ([name])  . CARDIAC CATHETERIZATION  09/15/2009   discrete 100% osital RCA lesion, 50% prox LAD lesion, non-obstructive disease in all coronaries ([name])  . CESAREAN SECTION    . COLONOSCOPY    . COLONOSCOPY W/ POLYPECTOMY    . CORONARY ANGIOPLASTY WITH STENT PLACEMENT  10/31/2007   PCI of distal Cfx with 2.25x68m Taxus Adam DES, 60% narrowing of mid LAD (Dr. TCorky Downs  . CORONARY ANGIOPLASTY WITH STENT PLACEMENT  06/11/2011   PCI of prox-mid LAD with 3x184mDES Resolute (Dr. T.[name] . DILATION AND CURETTAGE, DIAGNOSTIC / THERAPEUTIC    . DILITATION & CURRETTAGE/HYSTROSCOPY WITH HYDROTHERMAL ABLATION N/A 04/03/2016   Procedure: DILATATION & CURETTAGE/HYSTEROSCOPY WITH HYDROTHERMAL ABLATION;  Surgeon: ChShelly BombardMD;  [facility];  Service: Gynecology;  Laterality: N/A;  . NM MYOCAR PERF WALL MOTION  08/2009   persantine myoview - normal perfusion in all regions, perfusion defect  in anterior region (breast attenuation), EF 52%, low risk scan     reports that she has been smoking cigarettes. She has been smoking about 0.50 packs per day for the past 0.00 years. She has never used smokeless tobacco. She reports current drug use. Drug: Marijuana. She reports that she does not drink alcohol.   Family History  Problem Relation Age of Onset  . Leukemia Mother   . Clotting disorder Father        blood clot  . Hypertension Sister   . Diabetes Sister   . Stroke Sister 4060. Bleeding Disorder Son        ITP "free bleeding disorder"  . Colon cancer Paternal Grandmother   . Diabetes Paternal Grandmother   . Stomach cancer Neg Hx   . Pancreatic cancer Neg Hx     Physical Exam:   Vitals:   06/11/19 0951 06/11/19 1137 06/11/19 1139  BP: (!) 174/121 (!) 152/137 (!) 159/100  Pulse: 89 82 82  Resp: _0 Temp: 97.8 F (36.6 C)    TempSrc: Oral    SpO2: 96% 99% 99%   Constitutional: NAD, calm, comfortable Eyes: PERRL, lids and conjunctivae normal ENMT: Mucous membranes are moist. Posterior pharynx clear of any exudate or lesions.Normal dentition.  Neck: normal, supple, no masses, no thyromegaly Respiratory: clear to auscultation bilaterally, no wheezing, no crackles. Normal respiratory effort. No accessory muscle use.  Cardiovascular: Regular rate and rhythm, no murmurs / rubs / gallops. No extremity edema. 2+ pedal pulses. No carotid bruits.  Abdomen: no tenderness, no masses palpated. No hepatosplenomegaly. Bowel sounds positive.  Musculoskeletal: no clubbing / cyanosis. No joint deformity upper and lower extremities. Good ROM, no contractures. Normal muscle tone.  Neurologic: CN II-XII grossly intact. Sensation intact, DTR normal. Strength 5/5 in all 4.  Psychiatric: Normal judgment and insight. Alert and oriented x 3. Normal mood.  Skin: no rashes, lesions, ulcers. No induration   Labs on admission:    I have personally reviewed following labs and  imaging studies  CBC: Recent Labs  Lab 06/11/19 1214  WBC 8.1  NEUTROABS 7.2  HGB 14.2  HCT 44.3  MCV 85.9  PLT 23644 Basic Metabolic Panel: Recent Labs  Lab 06/11/19 1214  NA 140  K 3.4*  CL 104  CO2 23  GLUCOSE 120*  BUN 16  CREATININE 1.05*  CALCIUM 9.6   GFR: Estimated Creatinine Clearance: 61.7 mL/min (A) (by C-G formula based on SCr of 1.05 mg/dL (H)). Liver Function Tests: Recent Labs  Lab 06/11/19 1214  AST 20  ALT 22  ALKPHOS 79  BILITOT 0.7  PROT 8.3*  ALBUMIN 4.4   Urine analysis:    Component Value Date/Time   COLORURINE YELLOW 05/04/2018 1847   APPEARANCEUR HAZY (A) 05/04/2018 1847   LABSPEC 1.013 05/04/2018 1847   PHURINE 6.0 05/04/2018 1847   GLUCOSEU NEGATIVE 05/04/2018 1847   HGBUR NEGATIVE 05/04/2018 1847   BILIRUBINUR NEGATIVE 05/04/2018 1847   BILIRUBINUR neg 07/07/2017 1630   KETONESUR NEGATIVE 05/04/2018 1847   PROTEINUR NEGATIVE 05/04/2018 1847   UROBILINOGEN 0.2 07/07/2017 1630   UROBILINOGEN 0.2 03/28/2015 1236   NITRITE NEGATIVE 05/04/2018 1847   LEUKOCYTESUR NEGATIVE 05/04/2018 1847     Radiologic Exams on Admission:   Dg Chest 2 View  Result Date: 06/11/2019 CLINICAL DATA:  Increased wheezing for 1-2 days. EXAM: CHEST - 2 VIEW COMPARISON:  Chest x-ray dated 09/05/2018. FINDINGS: Borderline cardiomegaly, stable. Overall cardiomediastinal silhouette is stable. Lungs are clear. No pleural effusion or pneumothorax seen. Osseous structures about the chest are unremarkable. IMPRESSION: No active cardiopulmonary disease. No evidence of pneumonia or pulmonary edema. Electronically Signed   By: Franki Cabot M.D.   On: 06/11/2019 11:15    EKG:   Independently reviewed.   Orders placed or performed during the hospital encounter of 06/11/19  . ED EKG  . EKG 12-Lead  . EKG 12-Lead  . ED EKG  . EKG 12-Lead (at 6am)    --------------------------------------------------------------------------------------------------------------------------    Assessment / Plan:   Principal Problem:   Chest pain Active Problems:   Essential hypertension   Hyperlipidemia   Coronary artery disease   -  Principal Problem: Atypical chest pain/  -in the setting of severe coronary artery disease, -Patient will be admitted to telemetry bed -Patient has an elevated troponin of 43, second cardiac enzymes x3 -No acute changes in EKG, repeat EKG as needed -Patient will be kept n.p.o. -Cardiologist Dr. Harl Bowie was consulted by ED staff --awaiting consultation and evaluation -Continue supportive therapy, PRN nitroglycerin, O2 via nasal cannula, PRN morphine, continue medication of aspirin and Plavix, beta-blockers, -We will initiate therapeutic Lovenox for now -till evaluation by cardiology   Active Problems: Essential hypertension -Stable continue current meds,Continue Norvasc -Withholding lisinopril, HCTZ    Hyperlipidemia -Continue statin  GERD /with history of Barrett esophagus -Continue PPI if not tolerating will switch to IV Protonix -Continue Bentyl  Depression/anxiety -Currently stable, not in chronic antidepressant medication,  -PRN Xanax ordered   Obstructive sleep apnea -Not using CPAP, using oxygen nightly   Recent history of bacterial vaginosis -Continue monitoring, no antibiotic recommended at this time    DVT prophylaxis: SCD/Compression stockings and Lovenox SQ therapeutic dose Code Status:   Code Status: Full Code  Family Communication:  The above findings and plan of care has been discussed with patient in detail, she  expressed understanding and agreement of above plan.   Disposition Plan:  Anticipated 1-2 days  Consults called:  Cardiology Dr. Harl Bowie  Admission status: Patient will be admitted as Observation, with a greater than 2 midnight length of stay.   Cultures:  - none   Antimicrobial: - none   ---------------------------------------------------------------------------------------------------------------------------------------------------------------------------------------------------------------------------------------  Time spent: > than  17  Min.   SIGNED: Deatra James, MD, FACP,  FHM. [facility],  Pager [phone]  If 7PM-7AM, please contact night-coverage [email or web address] 06/11/2019, 3:13 PM

## 2019-06-12 ENCOUNTER — Inpatient Hospital Stay (HOSPITAL_COMMUNITY): Payer: Medicaid Other

## 2019-06-12 ENCOUNTER — Observation Stay (HOSPITAL_BASED_OUTPATIENT_CLINIC_OR_DEPARTMENT_OTHER): Payer: Medicaid Other

## 2019-06-12 ENCOUNTER — Other Ambulatory Visit: Payer: Self-pay

## 2019-06-12 ENCOUNTER — Observation Stay (HOSPITAL_COMMUNITY): Payer: Medicaid Other

## 2019-06-12 DIAGNOSIS — Z91018 Allergy to other foods: Secondary | ICD-10-CM | POA: Diagnosis not present

## 2019-06-12 DIAGNOSIS — J4521 Mild intermittent asthma with (acute) exacerbation: Secondary | ICD-10-CM | POA: Diagnosis not present

## 2019-06-12 DIAGNOSIS — E785 Hyperlipidemia, unspecified: Secondary | ICD-10-CM | POA: Diagnosis present

## 2019-06-12 DIAGNOSIS — Z9981 Dependence on supplemental oxygen: Secondary | ICD-10-CM | POA: Diagnosis not present

## 2019-06-12 DIAGNOSIS — R0602 Shortness of breath: Secondary | ICD-10-CM

## 2019-06-12 DIAGNOSIS — I5043 Acute on chronic combined systolic (congestive) and diastolic (congestive) heart failure: Secondary | ICD-10-CM | POA: Diagnosis present

## 2019-06-12 DIAGNOSIS — I25110 Atherosclerotic heart disease of native coronary artery with unstable angina pectoris: Secondary | ICD-10-CM | POA: Diagnosis present

## 2019-06-12 DIAGNOSIS — R0789 Other chest pain: Secondary | ICD-10-CM | POA: Diagnosis present

## 2019-06-12 DIAGNOSIS — E7800 Pure hypercholesterolemia, unspecified: Secondary | ICD-10-CM | POA: Diagnosis not present

## 2019-06-12 DIAGNOSIS — I130 Hypertensive heart and chronic kidney disease with heart failure and stage 1 through stage 4 chronic kidney disease, or unspecified chronic kidney disease: Secondary | ICD-10-CM | POA: Diagnosis present

## 2019-06-12 DIAGNOSIS — Z885 Allergy status to narcotic agent status: Secondary | ICD-10-CM | POA: Diagnosis not present

## 2019-06-12 DIAGNOSIS — N179 Acute kidney failure, unspecified: Secondary | ICD-10-CM | POA: Diagnosis not present

## 2019-06-12 DIAGNOSIS — Z9119 Patient's noncompliance with other medical treatment and regimen: Secondary | ICD-10-CM | POA: Diagnosis not present

## 2019-06-12 DIAGNOSIS — I5033 Acute on chronic diastolic (congestive) heart failure: Secondary | ICD-10-CM | POA: Diagnosis not present

## 2019-06-12 DIAGNOSIS — I5021 Acute systolic (congestive) heart failure: Secondary | ICD-10-CM | POA: Diagnosis not present

## 2019-06-12 DIAGNOSIS — J449 Chronic obstructive pulmonary disease, unspecified: Secondary | ICD-10-CM | POA: Diagnosis present

## 2019-06-12 DIAGNOSIS — J45901 Unspecified asthma with (acute) exacerbation: Secondary | ICD-10-CM | POA: Diagnosis present

## 2019-06-12 DIAGNOSIS — K219 Gastro-esophageal reflux disease without esophagitis: Secondary | ICD-10-CM | POA: Diagnosis present

## 2019-06-12 DIAGNOSIS — I340 Nonrheumatic mitral (valve) insufficiency: Secondary | ICD-10-CM | POA: Diagnosis not present

## 2019-06-12 DIAGNOSIS — R071 Chest pain on breathing: Secondary | ICD-10-CM

## 2019-06-12 DIAGNOSIS — I25118 Atherosclerotic heart disease of native coronary artery with other forms of angina pectoris: Secondary | ICD-10-CM | POA: Diagnosis not present

## 2019-06-12 DIAGNOSIS — I429 Cardiomyopathy, unspecified: Secondary | ICD-10-CM | POA: Diagnosis present

## 2019-06-12 DIAGNOSIS — R0609 Other forms of dyspnea: Secondary | ICD-10-CM | POA: Diagnosis not present

## 2019-06-12 DIAGNOSIS — E876 Hypokalemia: Secondary | ICD-10-CM | POA: Diagnosis not present

## 2019-06-12 DIAGNOSIS — I10 Essential (primary) hypertension: Secondary | ICD-10-CM

## 2019-06-12 DIAGNOSIS — I272 Pulmonary hypertension, unspecified: Secondary | ICD-10-CM | POA: Diagnosis present

## 2019-06-12 DIAGNOSIS — Z91040 Latex allergy status: Secondary | ICD-10-CM | POA: Diagnosis not present

## 2019-06-12 DIAGNOSIS — I2510 Atherosclerotic heart disease of native coronary artery without angina pectoris: Secondary | ICD-10-CM | POA: Diagnosis present

## 2019-06-12 DIAGNOSIS — I200 Unstable angina: Secondary | ICD-10-CM | POA: Diagnosis not present

## 2019-06-12 DIAGNOSIS — R042 Hemoptysis: Secondary | ICD-10-CM | POA: Diagnosis not present

## 2019-06-12 DIAGNOSIS — Z881 Allergy status to other antibiotic agents status: Secondary | ICD-10-CM | POA: Diagnosis not present

## 2019-06-12 DIAGNOSIS — F419 Anxiety disorder, unspecified: Secondary | ICD-10-CM | POA: Diagnosis present

## 2019-06-12 DIAGNOSIS — Z20828 Contact with and (suspected) exposure to other viral communicable diseases: Secondary | ICD-10-CM | POA: Diagnosis present

## 2019-06-12 DIAGNOSIS — J9621 Acute and chronic respiratory failure with hypoxia: Secondary | ICD-10-CM | POA: Diagnosis not present

## 2019-06-12 DIAGNOSIS — N183 Chronic kidney disease, stage 3 (moderate): Secondary | ICD-10-CM | POA: Diagnosis present

## 2019-06-12 DIAGNOSIS — Z7902 Long term (current) use of antithrombotics/antiplatelets: Secondary | ICD-10-CM | POA: Diagnosis not present

## 2019-06-12 LAB — CK TOTAL AND CKMB (NOT AT ARMC)
CK, MB: 4.9 ng/mL (ref 0.5–5.0)
Relative Index: 4.9 — ABNORMAL HIGH (ref 0.0–2.5)
Total CK: 100 U/L (ref 38–234)

## 2019-06-12 LAB — BASIC METABOLIC PANEL
Anion gap: 12 (ref 5–15)
BUN: 17 mg/dL (ref 6–20)
CO2: 23 mmol/L (ref 22–32)
Calcium: 9.4 mg/dL (ref 8.9–10.3)
Chloride: 106 mmol/L (ref 98–111)
Creatinine, Ser: 1.03 mg/dL — ABNORMAL HIGH (ref 0.44–1.00)
GFR calc Af Amer: >60 mL/min (ref >60)
GFR calc non Af Amer: >60 mL/min (ref >60)
Glucose, Bld: 151 mg/dL — ABNORMAL HIGH (ref 70–99)
Potassium: 3.8 mmol/L (ref 3.5–5.1)
Sodium: 141 mmol/L (ref 135–145)

## 2019-06-12 LAB — TROPONIN I (HIGH SENSITIVITY): Troponin I (High Sensitivity): 29 ng/L — ABNORMAL HIGH (ref <18)

## 2019-06-12 LAB — HIV ANTIBODY (ROUTINE TESTING W REFLEX): HIV Screen 4th Generation wRfx: NONREACTIVE

## 2019-06-12 LAB — ECHOCARDIOGRAM COMPLETE
Height: 61 in
Weight: 3283.97 oz

## 2019-06-12 LAB — HEMOGLOBIN AND HEMATOCRIT, BLOOD
HCT: 43.5 % (ref 36.0–46.0)
Hemoglobin: 13.9 g/dL (ref 12.0–15.0)

## 2019-06-12 LAB — ABO/RH: ABO/RH(D): O POS

## 2019-06-12 LAB — TYPE AND SCREEN
ABO/RH(D): O POS
Antibody Screen: NEGATIVE

## 2019-06-12 LAB — D-DIMER, QUANTITATIVE: D-Dimer, Quant: 0.72 ug/mL-FEU — ABNORMAL HIGH (ref 0.00–0.50)

## 2019-06-12 LAB — BRAIN NATRIURETIC PEPTIDE: B Natriuretic Peptide: 600.7 pg/mL — ABNORMAL HIGH (ref 0.0–100.0)

## 2019-06-12 MED ORDER — IPRATROPIUM-ALBUTEROL 0.5-2.5 (3) MG/3ML IN SOLN: 3.0000 mL | Freq: Four times a day (QID) | RESPIRATORY_TRACT | Status: DC

## 2019-06-12 MED ORDER — HYDRALAZINE HCL 25 MG PO TABS
25.0000 mg | ORAL_TABLET | Freq: Three times a day (TID) | ORAL | Status: DC
  Administered 2019-06-12 – 2019-06-19 (×18): 25 mg via ORAL
  Filled 2019-06-12 (×18): qty 1

## 2019-06-12 MED ORDER — IPRATROPIUM-ALBUTEROL 0.5-2.5 (3) MG/3ML IN SOLN
3.0000 mL | Freq: Three times a day (TID) | RESPIRATORY_TRACT | Status: DC
  Administered 2019-06-12 – 2019-06-15 (×9): 3 mL via RESPIRATORY_TRACT
  Filled 2019-06-12 (×9): qty 3

## 2019-06-12 MED ORDER — ALBUTEROL SULFATE (2.5 MG/3ML) 0.083% IN NEBU
2.5000 mg | INHALATION_SOLUTION | RESPIRATORY_TRACT | Status: DC | PRN
  Administered 2019-06-14 – 2019-06-15 (×2): 2.5 mg via RESPIRATORY_TRACT
  Filled 2019-06-12 (×2): qty 3

## 2019-06-12 MED ORDER — HYDRALAZINE HCL 10 MG PO TABS: 10.0000 mg | ORAL_TABLET | Freq: Three times a day (TID) | ORAL | Status: DC

## 2019-06-12 MED ORDER — IOHEXOL 350 MG/ML SOLN
100.0000 mL | Freq: Once | INTRAVENOUS | Status: AC | PRN
  Administered 2019-06-12: 100 mL via INTRAVENOUS

## 2019-06-12 MED ORDER — FUROSEMIDE 10 MG/ML IJ SOLN
40.0000 mg | Freq: Two times a day (BID) | INTRAMUSCULAR | Status: DC
  Administered 2019-06-13 – 2019-06-15 (×3): 40 mg via INTRAVENOUS
  Filled 2019-06-12 (×2): qty 4

## 2019-06-12 MED ORDER — LORAZEPAM 2 MG/ML IJ SOLN
0.5000 mg | Freq: Once | INTRAMUSCULAR | Status: AC | PRN
  Administered 2019-06-12: 0.5 mg via INTRAVENOUS
  Filled 2019-06-12: qty 1

## 2019-06-12 MED ORDER — MORPHINE SULFATE (PF) 2 MG/ML IV SOLN
2.0000 mg | Freq: Once | INTRAVENOUS | Status: AC
  Administered 2019-06-12: 2 mg via INTRAVENOUS
  Filled 2019-06-12: qty 1

## 2019-06-12 MED ORDER — LORAZEPAM 2 MG/ML IJ SOLN: 0.5000 mg | Freq: Once | INTRAMUSCULAR | Status: DC | PRN

## 2019-06-12 MED ORDER — FUROSEMIDE 10 MG/ML IJ SOLN
20.0000 mg | Freq: Once | INTRAMUSCULAR | Status: AC
  Administered 2019-06-12: 20 mg via INTRAVENOUS
  Filled 2019-06-12: qty 2

## 2019-06-12 MED ORDER — METOPROLOL TARTRATE 5 MG/5ML IV SOLN
5.0000 mg | Freq: Once | INTRAVENOUS | Status: AC
  Administered 2019-06-12: 5 mg via INTRAVENOUS
  Filled 2019-06-12: qty 5

## 2019-06-12 MED ORDER — AZITHROMYCIN 250 MG PO TABS
500.0000 mg | ORAL_TABLET | Freq: Every day | ORAL | Status: AC
  Administered 2019-06-12: 500 mg via ORAL
  Filled 2019-06-12: qty 2

## 2019-06-12 MED ORDER — ENOXAPARIN SODIUM 60 MG/0.6ML ~~LOC~~ SOLN
50.0000 mg | Freq: Every day | SUBCUTANEOUS | Status: DC
  Filled 2019-06-12: qty 0.5

## 2019-06-12 MED ORDER — METOCLOPRAMIDE HCL 5 MG/ML IJ SOLN
5.0000 mg | Freq: Once | INTRAMUSCULAR | Status: AC
  Administered 2019-06-12: 5 mg via INTRAVENOUS
  Filled 2019-06-12: qty 2

## 2019-06-12 MED ORDER — AZITHROMYCIN 250 MG PO TABS: 250.0000 mg | ORAL_TABLET | Freq: Every day | ORAL | Status: DC

## 2019-06-12 MED ORDER — SODIUM CHLORIDE (PF) 0.9 % IJ SOLN
INTRAMUSCULAR | Status: AC
  Filled 2019-06-12: qty 50

## 2019-06-12 MED ORDER — FUROSEMIDE 10 MG/ML IJ SOLN
40.0000 mg | Freq: Two times a day (BID) | INTRAMUSCULAR | Status: DC
  Filled 2019-06-12: qty 4

## 2019-06-12 MED ORDER — LABETALOL HCL 5 MG/ML IV SOLN
10.0000 mg | INTRAVENOUS | Status: DC | PRN
  Administered 2019-06-12 (×4): 10 mg via INTRAVENOUS
  Filled 2019-06-12 (×5): qty 4

## 2019-06-12 MED ORDER — METHYLPREDNISOLONE SODIUM SUCC 125 MG IJ SOLR
60.0000 mg | Freq: Three times a day (TID) | INTRAMUSCULAR | Status: DC
  Administered 2019-06-12 – 2019-06-13 (×3): 60 mg via INTRAVENOUS
  Filled 2019-06-12 (×3): qty 2

## 2019-06-12 MED ORDER — METOPROLOL TARTRATE 5 MG/5ML IV SOLN
INTRAVENOUS | Status: AC
  Filled 2019-06-12: qty 10

## 2019-06-12 NOTE — Progress Notes (Signed)
PROGRESS NOTE    Tracey Manning  OTL:[id number] DOB: 11/20/1961 DOA: 06/11/2019 PCP: Nolene Ebbs, MD   Brief Narrative:  Tracey Manning is Tracey Manning 58 y.o. female with medical history significant of CAD with multiple stents, HTN, HLD, COPD, anxiety presenting with shortness of breath and nonspecific chest pain.   Assessment & Plan:   Principal Problem:   Chest pain Active Problems:   Essential hypertension   Hyperlipidemia   Coronary artery disease  Shortness of Breath  Acute Hypoxic Respiratory Failure  Small Volume Hemoptysis: Pt with shortness of breath, inability to lie on back.  No notable LE or dependent edema or crackles on exam, but orthopnea is concerning for HF exacerbation.  She does have hx of COPD and some concern for COPD exacerbation, but no significant wheezing on my examination.  PE also on differential, unfortunately pt unable to tolerate CT PE protocol earlier today - notably has some small volume hemoptysis which makes this more concerning as well.  Duonebs, scheduled and prn nebs as well as steroids and azithro Diuresis with lasix IV BID Elevated d dimer, LE [place] prelims negative - she's already on therapeutic lovenox started by admitting provider yesterday Follow BNP Attempt CT PE again when sx improved Repeat CXR with "mild pulm edema" as well as focal opacity in R base Echo with EF 25-30% (see report) Trend H/H, watch for worsening hemoptysis - d/c lovenox if worsening  Atypical chest pain  Hypertensive Urgency -elevated troponin to 43 at presentation, now downtrending -EKG with nonspecific ST-T wave changes -Appreciate cardiology recommendations  - continue ASA, plavix, bisoprolol - started on therapeutic lovenox, will continue given elevated d dimer above  Essential hypertension - continue norvasc, bisoprolol.  Add hydralazine - holding lisinopril and HCTZ for now - prn hydral and labetalol  Hyperlipidemia -Continue statin  GERD /with history of  Barrett esophagus -Continue PPI if not tolerating will switch to IV Protonix -Continue Bentyl  Depression/anxiety -Currently stable, not in chronic antidepressant medication,  -PRN Xanax ordered  Obstructive sleep apnea -Not using CPAP, using oxygen nightly  Recent history of bacterial vaginosis -Continue monitoring, no antibiotic recommended at this time  DVT prophylaxis: lovenox, therapeutic dose Code Status: full  Family Communication: none at bedside Disposition Plan: pending further improvement in SOB  Consultants:   Cardiology  Procedures:  Echo IMPRESSIONS    1. The left ventricle has severely reduced systolic function, with an ejection fraction of 25-30%. The cavity size was moderately dilated. Left ventricular diastolic function could not be evaluated. Left ventricular diffuse hypokinesis.  2. The right ventricle has normal systolic function. The cavity was normal.  3. The mitral valve is grossly normal.  4. The aortic root is normal in size and structure.  5. The tricuspid valve is grossly normal.  6. The aortic valve is tricuspid. Mild thickening of the aortic valve. No stenosis of the aortic valve.  7. Severe global reduction in LV systolic function; moderate LVE; mild MR.  Antimicrobials:  Anti-infectives (From admission, onward)   Start     Dose/Rate Route Frequency Ordered Stop   06/13/19 1000  azithromycin (ZITHROMAX) tablet 250 mg     250 mg Oral Daily 06/12/19 1549 06/17/19 0959   06/12/19 1600  azithromycin (ZITHROMAX) tablet 500 mg     500 mg Oral Daily 06/12/19 1549 06/13/19 0959   06/11/19 1800  tinidazole (TINDAMAX) tablet 1,000 mg     1,000 mg Oral Daily after supper 06/11/19 1512 06/16/19 1759     Subjective:  C/o SOB and CP CP now improved, but SOB persistent She's unable to lie flat since last night SOB has been present for several weeks   Objective: Vitals:   06/12/19 0946 06/12/19 1059 06/12/19 1219 06/12/19 1330  BP: (!)  159/110 (!) 167/106 (!) 207/143 (!) 179/125  Pulse: (!) 102 98 (!) 119 (!) 101  Resp: (!) 28 (!) 22 (!) 22 (!) 24  Temp:      TempSrc:      SpO2: 90% 91% (!) 89% 90%  Weight:      Height:       No intake or output data in the 24 hours ending 06/12/19 1432 Filed Weights   06/11/19 1704  Weight: 93.1 kg    Examination:  General exam: Appears anxious and uncomfortable Respiratory system: increased WOB, sitting forward, unable to lie flat Cardiovascular system: tachycardia, regular . Gastrointestinal system: Abdomen is nondistended, soft and nontender. Central nervous system: Alert and oriented. No focal neurological deficits. Extremities: no LEE or dependent edema Skin: No rashes, lesions or ulcers  Data Reviewed: I have personally reviewed following labs and imaging studies  CBC: Recent Labs  Lab 06/11/19 1214 06/11/19 1512  WBC 8.1 7.9  NEUTROABS 7.2  --   HGB 14.2 14.1  HCT 44.3 41.9  MCV 85.9 85.3  PLT 232 671   Basic Metabolic Panel: Recent Labs  Lab 06/11/19 1214 06/11/19 1512 06/12/19 0547  NA 140  --  141  K 3.4*  --  3.8  CL 104  --  106  CO2 23  --  23  GLUCOSE 120*  --  151*  BUN 16  --  17  CREATININE 1.05* 1.12* 1.03*  CALCIUM 9.6  --  9.4   GFR: Estimated Creatinine Clearance: 62.7 mL/min (Kyasia Steuck) (by C-G formula based on SCr of 1.03 mg/dL (H)). Liver Function Tests: Recent Labs  Lab 06/11/19 1214  AST 20  ALT 22  ALKPHOS 79  BILITOT 0.7  PROT 8.3*  ALBUMIN 4.4   No results for input(s): LIPASE, AMYLASE in the last 168 hours. No results for input(s): AMMONIA in the last 168 hours. Coagulation Profile: No results for input(s): INR, PROTIME in the last 168 hours. Cardiac Enzymes: Recent Labs  Lab 06/11/19 1511 06/11/19 1819 06/11/19 2001  CKTOTAL 98 100 100  CKMB 5.4* 5.1* 4.9   BNP (last 3 results) No results for input(s): PROBNP in the last 8760 hours. HbA1C: No results for input(s): HGBA1C in the last 72 hours. CBG: No  results for input(s): GLUCAP in the last 168 hours. Lipid Profile: No results for input(s): CHOL, HDL, LDLCALC, TRIG, CHOLHDL, LDLDIRECT in the last 72 hours. Thyroid Function Tests: No results for input(s): TSH, T4TOTAL, FREET4, T3FREE, THYROIDAB in the last 72 hours. Anemia Panel: No results for input(s): VITAMINB12, FOLATE, FERRITIN, TIBC, IRON, RETICCTPCT in the last 72 hours. Sepsis Labs: No results for input(s): PROCALCITON, LATICACIDVEN in the last 168 hours.  Recent Results (from the past 240 hour(s))  SARS Coronavirus 2 (CEPHEID- Performed in [place] hospital lab), Hosp Order     Status: None   Collection Time: 06/11/19 12:14 PM   Specimen: Nasopharyngeal Swab  Result Value Ref Range Status   SARS Coronavirus 2 NEGATIVE NEGATIVE Final    Comment: (NOTE) If result is NEGATIVE SARS-CoV-2 target nucleic acids are NOT DETECTED. The SARS-CoV-2 RNA is generally detectable in upper and lower  respiratory specimens during the acute phase of infection. The lowest  concentration of SARS-CoV-2 viral copies  this assay can detect is 250  copies / mL. Jannett Schmall negative result does not preclude SARS-CoV-2 infection  and should not be used as the sole basis for treatment or other  patient management decisions.  Firas Guardado negative result may occur with  improper specimen collection / handling, submission of specimen other  than nasopharyngeal swab, presence of viral mutation(s) within the  areas targeted by this assay, and inadequate number of viral copies  (<250 copies / mL). Jadda Hunsucker negative result must be combined with clinical  observations, patient history, and epidemiological information. If result is POSITIVE SARS-CoV-2 target nucleic acids are DETECTED. The SARS-CoV-2 RNA is generally detectable in upper and lower  respiratory specimens dur ing the acute phase of infection.  Positive  results are indicative of active infection with SARS-CoV-2.  Clinical  correlation with patient history and other  diagnostic information is  necessary to determine patient infection status.  Positive results do  not rule out bacterial infection or co-infection with other viruses. If result is PRESUMPTIVE POSTIVE SARS-CoV-2 nucleic acids MAY BE PRESENT.   Natayla Cadenhead presumptive positive result was obtained on the submitted specimen  and confirmed on repeat testing.  While 2019 novel coronavirus  (SARS-CoV-2) nucleic acids may be present in the submitted sample  additional confirmatory testing may be necessary for epidemiological  and / or clinical management purposes  to differentiate between  SARS-CoV-2 and other Sarbecovirus currently known to infect humans.  If clinically indicated additional testing with an alternate test  methodology [phone]) is advised. The SARS-CoV-2 RNA is generally  detectable in upper and lower respiratory sp ecimens during the acute  phase of infection. The expected result is Negative. Fact Sheet for Patients:  StrictlyIdeas.no Fact Sheet for Healthcare Providers: BankingDealers.co.za This test is not yet approved or cleared by the [place] FDA and has been authorized for detection and/or diagnosis of SARS-CoV-2 by FDA under an Emergency Use Authorization (EUA).  This EUA will remain in effect (meaning this test can be used) for the duration of the COVID-19 declaration under Section 564(b)(1) of the Act, 21 U.S.C. section 360bbb-3(b)(1), unless the authorization is terminated or revoked sooner. Performed at [facility], [place] [address], [place], [place] [address]          Radiology Studies: Dg Chest 2 View  Result Date: 06/11/2019 CLINICAL DATA:  Increased wheezing for 1-2 days. EXAM: CHEST - 2 VIEW COMPARISON:  Chest x-ray dated 09/05/2018. FINDINGS: Borderline cardiomegaly, stable. Overall cardiomediastinal silhouette is stable. Lungs are clear. No pleural effusion or pneumothorax seen. Osseous  structures about the chest are unremarkable. IMPRESSION: No active cardiopulmonary disease. No evidence of pneumonia or pulmonary edema. Electronically Signed   By: Franki Cabot M.D.   On: 06/11/2019 11:15        Scheduled Meds: . amLODipine  10 mg Oral Daily  . aspirin EC  325 mg Oral Daily  . atorvastatin  40 mg Oral Daily  . clopidogrel  75 mg Oral Q0600  . dicyclomine  20 mg Oral TID AC  . enoxaparin (LOVENOX) injection  1 mg/kg Subcutaneous Q12H  . fluticasone furoate-vilanterol  1 puff Inhalation Daily  . ipratropium-albuterol  3 mL Nebulization TID  . loratadine  10 mg Oral QHS  . methylPREDNISolone (SOLU-MEDROL) injection  60 mg Intravenous Q8H  . nebivolol  20 mg Oral Daily  . neomycin-polymyxin b-dexamethasone  1 drop Left Eye BID  . pantoprazole  40 mg Oral BID  . potassium chloride  40 mEq Oral  BID  . tinidazole  1,000 mg Oral QPC supper   Continuous Infusions: . sodium chloride 100 mL/hr at 06/12/19 1343     LOS: 0 days    Time spent: over 30 min 40 min critical care time for shortness of breath, AHRF, small volume hemoptysis, CP, elevated d dimer    Fayrene Helper, MD [facility] Pager AMION  If 7PM-7AM, please contact night-coverage [email or web address] Password [facility] 06/12/2019, 2:32 PM

## 2019-06-12 NOTE — Progress Notes (Signed)
Lower extremity venous has been completed.   Preliminary results in CV Proc.   Abram Sander 06/12/2019 2:55 PM

## 2019-06-12 NOTE — Progress Notes (Addendum)
Noted low EF on echo Had some hemoptysis Troponin trend not significant  No evidence of acute coronary event Hold plavix given hemoptysis D dimer minimally elevated unable to have CTA to r/o PE LE venous duplex negative  Continue nebs/antibiotics/steroids Got 20 mg lasix this after noon and written for 40 iv bid tomorrow BNP elevated 600  Jenkins Rouge

## 2019-06-12 NOTE — Progress Notes (Signed)
At 0245 Pt c/o CP which she described as a "tightness and pain"; rated it 10/10. On call provider notified, orders received. Serial BP's initiated, elevated at this time. EKG obtained, 2 mg morphine administered as per order. Pt's SpO2 99% on RA, but placed [place] on at 2L for comfort, as pt was anxious. SL NTG administered, Pt resting with eyes closed after 5 minutes. When asked, pt stated pain was gone. PRN hydralazine administered as BP remained elevated. Will recheck in 30 min.

## 2019-06-12 NOTE — Progress Notes (Signed)
Report given to [facility], RN in [place].  Pt transported to CT and then RN took to [place] room 1241.   Andre Lefort

## 2019-06-12 NOTE — Progress Notes (Signed)
  Echocardiogram 2D Echocardiogram has been performed.  Matilde Bash 06/12/2019, 3:00 PM

## 2019-06-12 NOTE — Progress Notes (Addendum)
Cardiology Consultation:   Patient ID: Jermisha Hoffart MRN: [id number]; DOB: 22-Mar-1961  Admit date: 06/11/2019 Date of Consult: 06/12/2019  Primary Care Provider: Nolene Ebbs, MD Primary Cardiologist: Quay Burow, MD  Primary Electrophysiologist:  None    Patient Profile:   Tonishia Steffy is a 58 y.o. female with a hx of MI and CAD and multiple stents, tobacco use,  HTN and HLD who is being seen today for the evaluation of chest pain at the request of Dr. Florene Glen.  History of Present Illness:   Ms. Appelbaum with hx listed above including MI and CAD with stents back to 2004, DES, to LCX 2008, MDT stent to pLAD 2012 and RCA known 100% stenosis and neg myoview 2015.  Echo from 2015 with EF 60-65%, no RWMA, G1DD.   Pt admitted to [facility] with increased wheezing for 1-2 days and chest pain.   Her asthma has been worse, with the weather but she has been feeling bad for at least 6 weeks.  Her time frame is poor.  But since Thursday her breathing and chest pain seems worse to her until she felt so bad yesterday she called EMS.  She becomes SOB her HR goes up and then chest pain.     EKG:  The EKG was personally reviewed and demonstrates:  Initially with SR old ant Mi and prob LA enlargement followup EKGs many with SR to ST the EKGs with increased HR are poor quality, and on follow up SR with coupled PVCs and non specific T wave abnormalities.  At times prolonged Qtc.  Telemetry:  Telemetry was personally reviewed and demonstrates:  ST at 104 with occ PVC  HS troponin 43; 33 and 29  CKMB 98/5.4; 100/5.1; 100/4.9  Na 140, K+ 3.4 and now 3.8, BUN 16, Cr. 1.05 to 1.03, Hgb 14.2, WBC 8.1 plts 232  COVID neg 2V CXR  No active cardiopulmonary disease. No evidence of pneumonia or pulmonary edema.  During the night she did have further chest pain. Rated it 10/10.  rec'd NTG and morphine.  Resolved with treatment and BP was elevated 192/124 to 183/128.   Afebrile.  Currently was resting when I  went in room but with talking very dyspneic and I put her head down to about 20 degrees and she began with SOB and wheezes.  Tight lung sounds.  Called for nebulizer.    She is having trouble voiding as well is wearing suction to void but very DOE to void.    Heart Pathway Score:     Past Medical History:  Diagnosis Date  . Anxiety   . Arthritis   . Asthma   . Barrett's esophagus   . Chest pain   . Collagen vascular disease ([place])   . Colon polyps   . Coronary artery disease   . Depression   . Dyslipidemia   . GERD (gastroesophageal reflux disease)    occ  . Hypertension   . Myocardial infarct ([place]) [id number]  . Sciatica   . Sleep apnea    no CPAP ordered but using oxygen at bedtime  . Tobacco abuse     Past Surgical History:  Procedure Laterality Date  . CARDIAC CATHETERIZATION  10/09/2003   [employer] [place], [place]) - LAD with 30% prox narrowing, 50% stenosis in mid-portion of PLA; RCA with 40% narrowing proximally ([name], [name])  . CARDIAC CATHETERIZATION  10/10/2007   70% stenosis in first septal perforator branch of LAD, 60-70% narrowing in mid LAD, 20%  narrowing in mid AV groove Cfx with 80% diffuse narrowing in small distal marginal, total occlusion of mid RCA with L to R collaterals, 90% stenosis diffusely in prox branch of RCA followed by 70% stenosis in secondary curve & 80% stenosis in small marginal branch ([name])  . CARDIAC CATHETERIZATION  05/28/2008   normal L main, RCA with 100% prox lesion w/distal filling from LAD collaterals, LAD with 20% prox tubular lesion/ 40% mid LAD lesion/previous stent patent ([name])  . CARDIAC CATHETERIZATION  09/15/2009   discrete 100% osital RCA lesion, 50% prox LAD lesion, non-obstructive disease in all coronaries ([name])  . CESAREAN SECTION    . COLONOSCOPY    . COLONOSCOPY W/ POLYPECTOMY    . CORONARY ANGIOPLASTY WITH STENT PLACEMENT  10/31/2007   PCI of distal Cfx with 2.25x26m Taxus Adam  DES, 60% narrowing of mid LAD (Dr. TCorky Downs  . CORONARY ANGIOPLASTY WITH STENT PLACEMENT  06/11/2011   PCI of prox-mid LAD with 3x140mDES Resolute (Dr. T.[name] . DILATION AND CURETTAGE, DIAGNOSTIC / THERAPEUTIC    . DILITATION & CURRETTAGE/HYSTROSCOPY WITH HYDROTHERMAL ABLATION N/A 04/03/2016   Procedure: DILATATION & CURETTAGE/HYSTEROSCOPY WITH HYDROTHERMAL ABLATION;  Surgeon: ChShelly BombardMD;  [facility];  Service: Gynecology;  Laterality: N/A;  . NM MYOCAR PERF WALL MOTION  08/2009   persantine myoview - normal perfusion in all regions, perfusion defect in anterior region (breast attenuation), EF 52%, low risk scan     Home Medications:  Prior to Admission medications   Medication Sig Start Date End Date Taking? Authorizing Provider  albuterol (PROVENTIL HFA;VENTOLIN HFA) 108 (90 BASE) MCG/ACT inhaler Inhale 2 puffs into the lungs every 6 (six) hours as needed for wheezing or shortness of breath.   Yes [provider]  albuterol (PROVENTIL) (2.5 MG/3ML) 0.083% nebulizer solution Take 6 mLs by nebulization every 6 (six) hours as needed for wheezing or shortness of breath. 03/27/19  Yes [provider]  amLODipine (NORVASC) 10 MG tablet Take 10 mg by mouth daily.   Yes [provider]  aspirin EC 325 MG tablet Take 325 mg by mouth daily.   Yes [provider]  atorvastatin (LIPITOR) 40 MG tablet Take 1 tablet (40 mg total) by mouth daily. 01/04/14  Yes KeHarden MoMD  cetirizine (ZYRTEC) 10 MG tablet Take 10 mg by mouth daily as needed for allergies. 05/04/19  Yes [provider]  clopidogrel (PLAVIX) 75 MG tablet Take 1 tablet (75 mg total) by mouth daily with breakfast. 01/14/18  Yes BeLorretta HarpMD  fluticasone-salmeterol (ADVAIR HFA) 115-21 MCG/ACT inhaler Inhale 2 puffs into the lungs 2 (two) times daily. 01/04/14  Yes KeHarden MoMD  lisinopril-hydrochlorothiazide (PRINZIDE,ZESTORETIC) 20-12.5 MG per tablet Take 1 tablet  by mouth 2 (two) times daily.    Yes [provider]  methocarbamol (ROBAXIN) 500 MG tablet Take 1 tablet (500 mg total) by mouth 2 (two) times daily. 01/02/19  Yes Hedges, JeDellis FilbertPA-C  NARCAN 4 MG/0.1ML LIQD nasal spray kit Place 1 spray into the nose once.  09/01/18  Yes [provider]  Nebivolol HCl (BYSTOLIC) 20 MG TABS Take 20 mg by mouth daily.    Yes [provider]  neomycin-polymyxin b-dexamethasone (MAXITROL) 3.5-10000-0.1 SUSP Place 1 drop into the left eye 2 (two) times daily. 09/09/18  Yes [provider]  nitroGLYCERIN (NITROSTAT) 0.4 MG SL tablet Place 1 tablet (0.4 mg total) under the tongue every  5 (five) minutes as needed for chest pain. 01/14/18  Yes [name], MD  oxyCODONE-acetaminophen (PERCOCET) 10-325 MG tablet Take 1 tablet by mouth every 4 (four) hours as needed for pain. 06/05/19  Yes [name], MD  pantoprazole (PROTONIX) 20 MG tablet Take 2 tablets (40 mg total) by mouth 2 (two) times daily. 04/27/18  Yes Armbruster, Carlota Raspberry, MD  tinidazole (TINDAMAX) 500 MG tablet Take 2 tablets (1,000 mg total) by mouth daily with breakfast. 06/07/19  Yes [name], MD  Vitamin D, Ergocalciferol, (DRISDOL) 1.25 MG (50000 UT) CAPS capsule Take 50,000 Units by mouth once a week. 09/23/18  Yes [provider]  zolpidem (AMBIEN) 10 MG tablet Take 10 mg by mouth at bedtime as needed for sleep.   Yes [provider]  dicyclomine (BENTYL) 20 MG tablet Take 1 tablet (20 mg total) by mouth 3 (three) times daily before meals. Patient not taking: Reported on 06/11/2019 05/04/18   Carlisle Cater, PA-C  predniSONE (DELTASONE) 20 MG tablet Take 2 tablets (40 mg total) by mouth daily. Take 40 mg by mouth daily for 3 days, then 35m by mouth daily for 3 days, then 157mdaily for 3 days Patient not taking: Reported on 06/11/2019 01/17/19   BrMontine CirclePA-C    Inpatient Medications: Scheduled Meds: . amLODipine  10 mg Oral  Daily  . aspirin EC  325 mg Oral Daily  . atorvastatin  40 mg Oral Daily  . clopidogrel  75 mg Oral Q0600  . dicyclomine  20 mg Oral TID AC  . enoxaparin (LOVENOX) injection  1 mg/kg Subcutaneous Q12H  . fluticasone furoate-vilanterol  1 puff Inhalation Daily  . loratadine  10 mg Oral QHS  . nebivolol  20 mg Oral Daily  . neomycin-polymyxin b-dexamethasone  1 drop Left Eye BID  . pantoprazole  40 mg Oral BID  . potassium chloride  40 mEq Oral BID  . tinidazole  1,000 mg Oral QPC supper   Continuous Infusions: . sodium chloride 100 mL/hr at 06/12/19 0312   PRN Meds: acetaminophen, albuterol, ALPRAZolam, hydrALAZINE, nitroGLYCERIN, ondansetron (ZOFRAN) IV, oxyCODONE-acetaminophen **AND** oxyCODONE, zolpidem  Allergies:    Allergies  Allergen Reactions  . Other Anaphylaxis and Hives    Fresh strawberries (throat swelled)  . Amoxicillin Nausea And Vomiting    Did it involve swelling of the face/tongue/throat, SOB, or low BP? Yes Did it involve sudden or severe rash/hives, skin peeling, or any reaction on the inside of your mouth or nose? Yes Did you need to seek medical attention at a hospital or doctor's office? Yes When did it last happen?within last 10 years If all above answers are "NO", may proceed with cephalosporin use.   . Dilaudid [Hydromorphone Hcl] Nausea And Vomiting  . Ibuprofen Other (See Comments)    wheezing  . Latex Swelling    No reaction with bandaids    Social History:   Social History   Socioeconomic History  . Marital status: Single    Spouse name: Not on file  . Number of children: 2  . Years of education: 1219. Highest education level: Not on file  Occupational History  . Not on file  Social Needs  . Financial resource strain: Not on file  . Food insecurity    Worry: Not on file    Inability: Not on file  . Transportation needs    Medical: Not on file    Non-medical: Not on file  Tobacco Use  .  Smoking status: Current Every Day  Smoker    Packs/day: 0.50    Years: 0.00    Pack years: 0.00    Types: Cigarettes  . Smokeless tobacco: Never Used  Substance and Sexual Activity  . Alcohol use: No    Alcohol/week: 0.0 standard drinks    Comment: seldom  . Drug use: Yes    Types: Marijuana    Comment: used last night  . Sexual activity: Yes    Partners: Male    Birth control/protection: Post-menopausal  Lifestyle  . Physical activity    Days per week: Not on file    Minutes per session: Not on file  . Stress: Not on file  Relationships  . Social Herbalist on phone: Not on file    Gets together: Not on file    Attends religious service: Not on file    Active member of club or organization: Not on file    Attends meetings of clubs or organizations: Not on file    Relationship status: Not on file  . Intimate partner violence    Fear of current or ex partner: Not on file    Emotionally abused: Not on file    Physically abused: Not on file    Forced sexual activity: Not on file  Other Topics Concern  . Not on file  Social History Narrative  . Not on file    Family History:    Family History  Problem Relation Age of Onset  . Leukemia Mother   . Clotting disorder Father        blood clot  . Hypertension Sister   . Diabetes Sister   . Stroke Sister 23  . Bleeding Disorder Son        ITP "free bleeding disorder"  . Colon cancer Paternal Grandmother   . Diabetes Paternal Grandmother   . Stomach cancer Neg Hx   . Pancreatic cancer Neg Hx      ROS:  Please see the history of present illness.  General:no colds or fevers, no weight changes Skin:no rashes or ulcers HEENT:no blurred vision, no congestion CV:see HPI PUL:see HPI GI:no diarrhea constipation or melena, no indigestion GU:no hematuria, no dysuria MS:no joint pain, no claudication Neuro:no syncope, no lightheadedness Endo:no diabetes, no thyroid disease  All other ROS reviewed and negative.     Physical Exam/Data:    Vitals:   06/12/19 0318 06/12/19 0322 06/12/19 0408 06/12/19 0537  BP: (!) 181/136 (!) 183/128 (!) 171/114 (!) 180/126  Pulse: 100 (!) 102 (!) 108 (!) 104  Resp:    (!) 28  Temp:    97.6 F (36.4 C)  TempSrc:    Oral  SpO2: 100%   93%  Weight:      Height:       No intake or output data in the 24 hours ending 06/12/19 0733 Last 3 Weights 06/11/2019 06/05/2019 02/01/2019  Weight (lbs) 205 lb 4 oz 206 lb 1.6 oz 208 lb  Weight (kg) 93.1 kg 93.486 kg 94.348 kg     Body mass index is 38.78 kg/m.  General:  Well nourished, well developed, in mild to moderate distress with any activity HEENT: normal Lymph: no adenopathy Neck: no JVD Endocrine:  No thryomegaly Vascular: No carotid bruits; pedal pulses 2+ bilaterally   Cardiac:  normal S1, S2; RRR; no murmur, gallup rub or click  Lungs:  Tight and diminished breath sounds to auscultation bilaterally, + wheezing, no rhonchi or rales  Abd: soft, nontender, no hepatomegaly  Ext: no edema Musculoskeletal:  No deformities, BUE and BLE strength normal and equal Skin: warm and dry  Neuro:  Alert and oriented X 3,  MAE, follows commands no focal abnormalities noted Psych:  Normal affect   Relevant CV Studies: Echo 04/17/14 Study Conclusions   - Left ventricle: The cavity size was normal. Systolic function was  normal. The estimated ejection fraction was in the range of 60%  to 65%. Wall motion was normal; there were no regional wall  motion abnormalities. Doppler parameters are consistent with  abnormal left ventricular relaxation (grade 1 diastolic  dysfunction).   -------------------------------------------------------------------   -------------------------------------------------------------------  Left ventricle: The cavity size was normal. Systolic function was  normal. The estimated ejection fraction was in the range of 60% to  65%. Wall motion was normal; there were no regional wall motion  abnormalities. Doppler  parameters are consistent with abnormal left  ventricular relaxation (grade 1 diastolic dysfunction).   -------------------------------------------------------------------  Aortic valve:  Trileaflet; normal thickness, mildly calcified  leaflets. Mobility was not restricted. Doppler: Transvalvular  velocity was within the normal range. There was no stenosis. There  was no regurgitation.  Peak gradient (S): 14 mm Hg.   -------------------------------------------------------------------  Aorta: Aortic root: The aortic root was normal in size.   -------------------------------------------------------------------  Mitral valve:  Structurally normal valve.  Mobility was not  restricted. Doppler: Transvalvular velocity was within the normal  range. There was no evidence for stenosis. There was trivial  regurgitation.   -------------------------------------------------------------------  Left atrium: LA Volume/BSA= 21.6 ml/m2. The atrium was normal in  size.   -------------------------------------------------------------------  Right ventricle: The cavity size was normal. Wall thickness was  normal. Systolic function was normal.   -------------------------------------------------------------------  Pulmonic valve:  Doppler: Transvalvular velocity was within the  normal range. There was no evidence for stenosis.   -------------------------------------------------------------------  Tricuspid valve:  Structurally normal valve.  Doppler:  Transvalvular velocity was within the normal range. There was  trivial regurgitation.   -------------------------------------------------------------------  Pulmonary artery:  The main pulmonary artery was normal-sized.  Systolic pressure was within the normal range.   -------------------------------------------------------------------  Right atrium: The atrium was normal in size.    -------------------------------------------------------------------  Pericardium: The pericardium was normal in appearance. There was  no pericardial effusion.   -------------------------------------------------------------------  Systemic veins:  Inferior vena cava: The vessel was normal in size. The  respirophasic diameter changes were in the normal range (= 50%),  consistent with normal central venous pressure. Diameter: 15.7 mm.   Laboratory Data:  High Sensitivity Troponin:   Recent Labs  Lab 06/11/19 1214 06/11/19 1511 06/12/19 0547  TROPONINIHS 43* 33.0* 29.0*     Cardiac EnzymesNo results for input(s): TROPONINI in the last 168 hours. No results for input(s): TROPIPOC in the last 168 hours.  Chemistry Recent Labs  Lab 06/11/19 1214 06/11/19 1512 06/12/19 0547  NA 140  --  141  K 3.4*  --  3.8  CL 104  --  106  CO2 23  --  23  GLUCOSE 120*  --  151*  BUN 16  --  17  CREATININE 1.05* 1.12* 1.03*  CALCIUM 9.6  --  9.4  GFRNONAA 59* 54* >60  GFRAA >60 >60 >60  ANIONGAP 13  --  12    Recent Labs  Lab 06/11/19 1214  PROT 8.3*  ALBUMIN 4.4  AST 20  ALT 22  ALKPHOS 79  BILITOT 0.7   Hematology Recent Labs  Lab  06/11/19 1214 06/11/19 1512  WBC 8.1 7.9  RBC 5.16* 4.91  HGB 14.2 14.1  HCT 44.3 41.9  MCV 85.9 85.3  MCH 27.5 28.7  MCHC 32.1 33.7  RDW 16.1* 16.0*  PLT 232 229   BNPNo results for input(s): BNP, PROBNP in the last 168 hours.  DDimer No results for input(s): DDIMER in the last 168 hours.   Radiology/Studies:  Dg Chest 2 View  Result Date: 06/11/2019 CLINICAL DATA:  Increased wheezing for 1-2 days. EXAM: CHEST - 2 VIEW COMPARISON:  Chest x-ray dated 09/05/2018. FINDINGS: Borderline cardiomegaly, stable. Overall cardiomediastinal silhouette is stable. Lungs are clear. No pleural effusion or pneumothorax seen. Osseous structures about the chest are unremarkable. IMPRESSION: No active cardiopulmonary disease. No evidence of pneumonia or  pulmonary edema. Electronically Signed   By: Franki Cabot M.D.   On: 06/11/2019 11:15    Assessment and Plan:  Angina with HTN and mild troponin HS elevation and neg CKMB.  Pt is NPO with severity of chest pain and pt's hx of CAD with stents to LCX and pLAD and occluded RCA. On ASA and plavix she does need cardiac cath.   Her troponins have improved but I wonder if this is late presentation of MI.  Not sure she could tolerate cath today, can not tolerate being 20 degrees for more that seconds.   May be prudent to treat respiratory status and cath tomorrow   The patient understands that risks included but are not limited to stroke (1 in 1000), death (1 in 1000), kidney failure [usually temporary] (1 in 500), bleeding (1 in 200), allergic reaction [possibly serious] (1 in 200).   Dr. Johnsie Cancel to see.         HTN uncontrolled in night rec'd IV hydralazine. bystolic 20 mg amlodipine 10, lisinopril HCTZ 20/12.5  BP still elevated         HLD on lipitor 40 mg  1.  Hx of Barrett's esophagus and GERD on protonix 2.  Asthma on inhalers has needed more freq with this episode.  3. Tobacco abuse has had to almost stop due to SOB over last week. 4. CAD as above.        For questions or updates, please contact [place] Please consult [email or web address] for contact info under     Signed, Cecilie Kicks, NP  06/12/2019 7:33 AM   Patient examined chart reviewed. Chronically ill black female asking for morphine and food. Lungs diffuse wheezing distant heart sounds soft abdomen trace edema pedal pulses palpable. Despite 10/10 chest pain no acute ECG changes and flat minimally elevated troponin She is a poor candidate for non invasive testing given known occluded RCA inability to walk on treadmill and active wheezing Would Rx aggressively with steroids to clear asthma exacerbation CXR no infiltrate Needs to stop smoking May need heart cath latter in week but currently Cannot lay flat and not clear that radial  approach would be possible. Add hydralazine for BP.  Will follow  Jenkins Rouge

## 2019-06-12 NOTE — Progress Notes (Addendum)
Chaplain received referral from [facility] this AM.    Provided support with Tracey Manning at bedside, as she was experiencing shortness of breath and nausea.  Expressed anxiety around SOB, frustration, fear and uncertainty of future.  She described feeling depression, which she can feel frustrated and judge herself around, as she states "I have been through a lot and never felt depression."  Found comfort in prayers and pastoral presence.    Tracey Manning spoke of distress in family system - specifically with sister, recent stress and disconnection of relationship with s/o (Feb, 2020), son who is incarcerated for life, death of close sister - anniversary of this death is July 04, 2023.  She has begun seeing a counselor, who called during our session.  Tracey Manning described the tension of feeling anxiety around these unresolved relational stresses, yet finding herself SOB when she attempts to speak with others about them.   Tracey Manning spoke of wishing to have conversations with all of her family in order to be prepared for whatever her health journey brings her - including end of life.  She has experienced a separation from her sister, with whom she has attempted to reconcile and now is navigating holding appropriate boundaries.

## 2019-06-13 DIAGNOSIS — I200 Unstable angina: Secondary | ICD-10-CM

## 2019-06-13 LAB — CBC
HCT: 40.2 % (ref 36.0–46.0)
Hemoglobin: 13 g/dL (ref 12.0–15.0)
MCH: 29 pg (ref 26.0–34.0)
MCHC: 32.3 g/dL (ref 30.0–36.0)
MCV: 89.7 fL (ref 80.0–100.0)
Platelets: 216 10*3/uL (ref 150–400)
RBC: 4.48 MIL/uL (ref 3.87–5.11)
RDW: 17.1 % — ABNORMAL HIGH (ref 11.5–15.5)
WBC: 14.2 10*3/uL — ABNORMAL HIGH (ref 4.0–10.5)
nRBC: 0 % (ref 0.0–0.2)

## 2019-06-13 LAB — URINALYSIS, ROUTINE W REFLEX MICROSCOPIC
Bilirubin Urine: NEGATIVE
Glucose, UA: NEGATIVE mg/dL
Hgb urine dipstick: NEGATIVE
Ketones, ur: NEGATIVE mg/dL
Nitrite: NEGATIVE
Protein, ur: NEGATIVE mg/dL
Specific Gravity, Urine: 1.021 (ref 1.005–1.030)
WBC, UA: >50 WBC/hpf — ABNORMAL HIGH (ref 0–5)
pH: 5 (ref 5.0–8.0)

## 2019-06-13 LAB — COMPREHENSIVE METABOLIC PANEL
ALT: 51 U/L — ABNORMAL HIGH (ref 0–44)
AST: 30 U/L (ref 15–41)
Albumin: 3.8 g/dL (ref 3.5–5.0)
Alkaline Phosphatase: 63 U/L (ref 38–126)
Anion gap: 13 (ref 5–15)
BUN: 25 mg/dL — ABNORMAL HIGH (ref 6–20)
CO2: 25 mmol/L (ref 22–32)
Calcium: 9.2 mg/dL (ref 8.9–10.3)
Chloride: 103 mmol/L (ref 98–111)
Creatinine, Ser: 1.37 mg/dL — ABNORMAL HIGH (ref 0.44–1.00)
GFR calc Af Amer: 49 mL/min — ABNORMAL LOW (ref >60)
GFR calc non Af Amer: 43 mL/min — ABNORMAL LOW (ref >60)
Glucose, Bld: 133 mg/dL — ABNORMAL HIGH (ref 70–99)
Potassium: 4.4 mmol/L (ref 3.5–5.1)
Sodium: 141 mmol/L (ref 135–145)
Total Bilirubin: 0.6 mg/dL (ref 0.3–1.2)
Total Protein: 7.1 g/dL (ref 6.5–8.1)

## 2019-06-13 LAB — MRSA PCR SCREENING: MRSA by PCR: NEGATIVE

## 2019-06-13 LAB — TROPONIN T
Troponin T TROPT: <0.01 ng/mL (ref <0.011)
Troponin T TROPT: <0.01 ng/mL (ref <0.011)

## 2019-06-13 MED ORDER — ISOSORBIDE DINITRATE 5 MG PO TABS
5.0000 mg | ORAL_TABLET | Freq: Three times a day (TID) | ORAL | Status: DC
  Administered 2019-06-13 – 2019-06-14 (×6): 5 mg via ORAL
  Filled 2019-06-13 (×7): qty 1

## 2019-06-13 MED ORDER — SODIUM CHLORIDE 0.9% FLUSH
3.0000 mL | Freq: Two times a day (BID) | INTRAVENOUS | Status: DC
  Administered 2019-06-13 – 2019-06-15 (×2): 3 mL via INTRAVENOUS

## 2019-06-13 MED ORDER — CHLORHEXIDINE GLUCONATE CLOTH 2 % EX PADS
6.0000 | MEDICATED_PAD | Freq: Every day | CUTANEOUS | Status: DC
  Administered 2019-06-13 – 2019-06-14 (×2): 6 via TOPICAL

## 2019-06-13 MED ORDER — ENOXAPARIN SODIUM 40 MG/0.4ML ~~LOC~~ SOLN
40.0000 mg | Freq: Every day | SUBCUTANEOUS | Status: DC
  Administered 2019-06-13 – 2019-06-14 (×2): 40 mg via SUBCUTANEOUS
  Filled 2019-06-13: qty 0.4

## 2019-06-13 NOTE — Progress Notes (Signed)
Progress Note  Patient Name: Tracey Manning Date of Encounter: 06/13/2019  Primary Cardiologist: Quay Burow, MD   Subjective   Feels much better no chest pain and SOB improved.  She was able to sleep.    Inpatient Medications    Scheduled Meds:  amLODipine  10 mg Oral Daily   aspirin EC  325 mg Oral Daily   atorvastatin  40 mg Oral Daily   azithromycin  250 mg Oral Daily   Chlorhexidine Gluconate Cloth  6 each Topical Daily   dicyclomine  20 mg Oral TID AC   enoxaparin (LOVENOX) injection  50 mg Subcutaneous Daily   fluticasone furoate-vilanterol  1 puff Inhalation Daily   furosemide  40 mg Intravenous BID   hydrALAZINE  25 mg Oral Q8H   ipratropium-albuterol  3 mL Nebulization TID   loratadine  10 mg Oral QHS   methylPREDNISolone (SOLU-MEDROL) injection  60 mg Intravenous Q8H   nebivolol  20 mg Oral Daily   neomycin-polymyxin b-dexamethasone  1 drop Left Eye BID   pantoprazole  40 mg Oral BID   potassium chloride  40 mEq Oral BID   sodium chloride (PF)       tinidazole  1,000 mg Oral QPC supper   Continuous Infusions:  PRN Meds: acetaminophen, albuterol, ALPRAZolam, hydrALAZINE, labetalol, LORazepam, nitroGLYCERIN, ondansetron (ZOFRAN) IV, oxyCODONE-acetaminophen **AND** oxyCODONE, zolpidem   Vital Signs    Vitals:   06/13/19 0200 06/13/19 0400 06/13/19 0500 06/13/19 0501  BP: (!) 144/108 (!) 153/107    Pulse: 92 91 91 77  Resp: 18 (!) 23 (!) 26 17  Temp:  97.6 F (36.4 C)    TempSrc:  Oral    SpO2: 97% 100% 100% 99%  Weight:   94.8 kg   Height:        Intake/Output Summary (Last 24 hours) at 06/13/2019 0749 Last data filed at 06/13/2019 0318 Gross per 24 hour  Intake 1240 ml  Output 2100 ml  Net -860 ml   Last 3 Weights 06/13/2019 06/12/2019 06/11/2019  Weight (lbs) 208 lb 15.9 oz 210 lb 1.6 oz 205 lb 4 oz  Weight (kg) 94.8 kg 95.3 kg 93.1 kg      Telemetry    SR to ST with PACs  One burst of NSVT - Personally Reviewed  ECG     No new - Personally Reviewed  Physical Exam   GEN: No acute distress.   Neck: + JVD at 30 degrees  Cardiac: RRR, no murmurs, rubs, or gallops.  Respiratory: not as tight now with some rales and rhonchi to auscultation bilaterally. GI: Soft, nontender, non-distended  MS: No edema; No deformity. Neuro:  Nonfocal  Psych: Normal affect   Labs    High Sensitivity Troponin:   Recent Labs  Lab 06/11/19 1214 06/11/19 1511 06/12/19 0547  TROPONINIHS 43* 33.0* 29.0*      Cardiac EnzymesNo results for input(s): TROPONINI in the last 168 hours. No results for input(s): TROPIPOC in the last 168 hours.   Chemistry Recent Labs  Lab 06/11/19 1214 06/11/19 1512 06/12/19 0547  NA 140  --  141  K 3.4*  --  3.8  CL 104  --  106  CO2 23  --  23  GLUCOSE 120*  --  151*  BUN 16  --  17  CREATININE 1.05* 1.12* 1.03*  CALCIUM 9.6  --  9.4  PROT 8.3*  --   --   ALBUMIN 4.4  --   --  AST 20  --   --   ALT 22  --   --   ALKPHOS 79  --   --   BILITOT 0.7  --   --   GFRNONAA 59* 54* >60  GFRAA >60 >60 >60  ANIONGAP 13  --  12     Hematology Recent Labs  Lab 06/11/19 1214 06/11/19 1512 06/12/19 1742  WBC 8.1 7.9  --   RBC 5.16* 4.91  --   HGB 14.2 14.1 13.9  HCT 44.3 41.9 43.5  MCV 85.9 85.3  --   MCH 27.5 28.7  --   MCHC 32.1 33.7  --   RDW 16.1* 16.0*  --   PLT 232 229  --     BNP Recent Labs  Lab 06/12/19 1411  BNP 600.7*     DDimer  Recent Labs  Lab 06/12/19 1411  DDIMER 0.72*     Radiology    Dg Chest 2 View  Result Date: 06/11/2019 CLINICAL DATA:  Increased wheezing for 1-2 days. EXAM: CHEST - 2 VIEW COMPARISON:  Chest x-ray dated 09/05/2018. FINDINGS: Borderline cardiomegaly, stable. Overall cardiomediastinal silhouette is stable. Lungs are clear. No pleural effusion or pneumothorax seen. Osseous structures about the chest are unremarkable. IMPRESSION: No active cardiopulmonary disease. No evidence of pneumonia or pulmonary edema. Electronically  Signed   By: Franki Cabot M.D.   On: 06/11/2019 11:15   Ct Angio Chest Pe W Or Wo Contrast  Result Date: 06/12/2019 CLINICAL DATA:  Shortness of breath. EXAM: CT ANGIOGRAPHY CHEST WITH CONTRAST TECHNIQUE: Multidetector CT imaging of the chest was performed using the standard protocol during bolus administration of intravenous contrast. Multiplanar CT image reconstructions and MIPs were obtained to evaluate the vascular anatomy. CONTRAST:  114mL OMNIPAQUE IOHEXOL 350 MG/ML SOLN COMPARISON:  Radiograph earlier this day. FINDINGS: Cardiovascular: There are no filling defects within the pulmonary arteries to suggest pulmonary embolus. Thoracic aorta is normal in caliber. Cannot assess for dissection given phase of contrast, no perivascular stranding. Multi chamber cardiomegaly. Coronary artery calcifications/stents. Contrast refluxes into the hepatic veins and IVC. No pericardial effusion. Mediastinum/Nodes: Small reactive appearing mediastinal and hilar lymph nodes. The esophagus is decompressed. No thyroid nodule. Lungs/Pleura: Bilateral ground-glass opacities, central predominant, consistent with pulmonary edema. Mild smooth septal thickening, also pulmonary edema. Small bilateral pleural effusions, right greater than left. Prominent lower lobe peribronchial thickening which may be congestive. Upper Abdomen: Contrast refluxing into the hepatic veins and IVC. Hepatic steatosis. Probable left adrenal adenomas. Musculoskeletal: There are no acute or suspicious osseous abnormalities. Review of the MIP images confirms the above findings. IMPRESSION: 1. No pulmonary embolus. 2. CHF with cardiomegaly, pulmonary edema, and small pleural effusions. Elevated right heart pressures with contrast refluxing into the hepatic veins and IVC. 3. Coronary artery calcifications. Aortic Atherosclerosis (ICD10-I70.0). Electronically Signed   By: Keith Rake M.D.   On: 06/12/2019 22:38   Dg Chest Port 1 View  Result Date:  06/12/2019 CLINICAL DATA:  Shortness of breath EXAM: PORTABLE CHEST 1 VIEW COMPARISON:  June 11, 2019 FINDINGS: No pneumothorax. Stable cardiomegaly. The hila and mediastinum are unremarkable. Mild diffuse interstitial opacities. More focal opacity in the right base. No other acute abnormalities. IMPRESSION: 1. Cardiomegaly.  Mild pulmonary edema. 2. The more focal opacity in the right base may represent focal infiltrate such as pneumonia or aspiration versus asymmetric edema. Recommend clinical correlation and follow-up to resolution. Electronically Signed   By: Dorise Bullion [name] M.D   On: 06/12/2019 14:48  Vas [place] Lower Extremity Venous (dvt)  Result Date: 06/12/2019  Lower Venous Study Indications: SOB.  Limitations: Patient positioning. Comparison Study: no prior Performing Technologist: Abram Sander RVS  Examination Guidelines: A complete evaluation includes B-mode imaging, spectral Doppler, color Doppler, and power Doppler as needed of all accessible portions of each vessel. Bilateral testing is considered an integral part of a complete examination. Limited examinations for reoccurring indications may be performed as noted.  +---------+---------------+---------+-----------+----------+-------+  RIGHT     Compressibility Phasicity Spontaneity Properties Summary  +---------+---------------+---------+-----------+----------+-------+  CFV       Full            Yes       Yes                             +---------+---------------+---------+-----------+----------+-------+  SFJ       Full                                                      +---------+---------------+---------+-----------+----------+-------+  FV Prox   Full                                                      +---------+---------------+---------+-----------+----------+-------+  FV Mid    Full                                                      +---------+---------------+---------+-----------+----------+-------+  FV Distal Full                                                       +---------+---------------+---------+-----------+----------+-------+  PFV       Full                                                      +---------+---------------+---------+-----------+----------+-------+  POP       Full            Yes       Yes                             +---------+---------------+---------+-----------+----------+-------+  PTV       Full                                                      +---------+---------------+---------+-----------+----------+-------+  PERO      Full                                                      +---------+---------------+---------+-----------+----------+-------+   +---------+---------------+---------+-----------+----------+-------+  LEFT      Compressibility Phasicity Spontaneity Properties Summary  +---------+---------------+---------+-----------+----------+-------+  CFV       Full            Yes       Yes                             +---------+---------------+---------+-----------+----------+-------+  SFJ       Full                                                      +---------+---------------+---------+-----------+----------+-------+  FV Prox   Full                                                      +---------+---------------+---------+-----------+----------+-------+  FV Mid    Full                                                      +---------+---------------+---------+-----------+----------+-------+  FV Distal Full                                                      +---------+---------------+---------+-----------+----------+-------+  PFV       Full                                                      +---------+---------------+---------+-----------+----------+-------+  POP       Full            Yes       Yes                             +---------+---------------+---------+-----------+----------+-------+  PTV       Full                                                       +---------+---------------+---------+-----------+----------+-------+  PERO      Full                                                      +---------+---------------+---------+-----------+----------+-------+     Summary: Right: There is no evidence of deep vein thrombosis in the lower extremity. No cystic structure found in the popliteal fossa. Left: There is no evidence of deep vein thrombosis in the lower extremity. No cystic structure  found in the popliteal fossa.  *See table(s) above for measurements and observations. Electronically signed by Servando Snare MD on 06/12/2019 at 3:53:02 PM.    Final     Cardiac Studies   Echo 06/12/19 IMPRESSIONS    1. The left ventricle has severely reduced systolic function, with an ejection fraction of 25-30%. The cavity size was moderately dilated. Left ventricular diastolic function could not be evaluated. Left ventricular diffuse hypokinesis.  2. The right ventricle has normal systolic function. The cavity was normal.  3. The mitral valve is grossly normal.  4. The aortic root is normal in size and structure.  5. The tricuspid valve is grossly normal.  6. The aortic valve is tricuspid. Mild thickening of the aortic valve. No stenosis of the aortic valve.  7. Severe global reduction in LV systolic function; moderate LVE; mild MR.  FINDINGS  Left Ventricle: The left ventricle has severely reduced systolic function, with an ejection fraction of 25-30%. The cavity size was moderately dilated. There is no increase in left ventricular wall thickness. Left ventricular diastolic function could  not be evaluated. Left ventricular diffuse hypokinesis.  Right Ventricle: The right ventricle has normal systolic function. The cavity was normal.  Left Atrium: Left atrial size was normal in size.  Right Atrium: Right atrial size was normal in size. Right atrial pressure is estimated at 10 mmHg.  Interatrial Septum: No atrial level shunt detected by color flow  Doppler.  Pericardium: There is no evidence of pericardial effusion.  Mitral Valve: The mitral valve is grossly normal. Mitral valve regurgitation is mild by color flow Doppler.  Tricuspid Valve: The tricuspid valve is grossly normal. Tricuspid valve regurgitation was not visualized by color flow Doppler.  Aortic Valve: The aortic valve is tricuspid Mild thickening of the aortic valve. Aortic valve regurgitation was not visualized by color flow Doppler. There is No stenosis of the aortic valve.  Pulmonic Valve: The pulmonic valve was not well visualized. Pulmonic valve regurgitation is not visualized by color flow Doppler.  Aorta: The aortic root is normal in size and structure.  Venous: The inferior vena cava is normal in size with greater than 50% respiratory variability.  Additional Comments: Severe global reduction in LV systolic function; moderate LVE; mild MR.      Patient Profile     58 y.o. female with a hx of MI and CAD and multiple stents, tobacco use,  HTN and HLD now with admit for SOB/Asthma and new cardiomyopathy.   Assessment & Plan    Acute SOB combination of Asthma and HF with elevated BNP.  Now on IV lasix and IV steroids.  Lasix 40 mg BID.  Pulmonary edema seen on CT of chest.   --neg 860 since admit and wt down from 95.3 kg to 94.8  Feeling much better   Cardiomyopathy new- EF down from 60-65% in 2015 to 25-30%.  Once improved may need cath to eval for increased CAD. Pt is on bystolic for BB- most likely due to asthma,  Now hydralazine 25 every 8 hours -may need isordil with the hydralazine.    --had been on lisinopril prior to admit let it wash out and once decision about cath made begin entresto if pt can afford.    HTN uncontrolled still elevated was 153/107 now to 113/79.  Need to monitor- if with meds for CM decrease BP would decrease amlodipine and stop if needed.   CAD with hx of MI and stents to LCX and pLAD, occluded  RCA.  Last cath 2015.    --troponins  HS  43 to 29.  Neg CKMB   HLD on lipitor.  No recent lipids will check in AM  Tobacco use has been instructed and understands she needs to stop.    Elevated ddimer but neg CTA of chest for PE, neg DVT   CKD 3 with slight increase of Cr with CTA of chest yesterday no dye today.    Pt is NPO       For questions or updates, please contact [facility] Please consult [email or web address] for contact info under        Signed, Cecilie Kicks, NP  06/13/2019, 7:49 AM

## 2019-06-13 NOTE — Progress Notes (Addendum)
Chaplain follow up for continued support.   Tracey Manning states she was able to talk with her sister with whom she had tension.  She expresses relief that they are speaking and hopeful that they are able to continue to work on their relationship.   She is energetic and expresses desire to stop smoking and engage in healthier habits.  She speaks with chaplain about using her counselor as a resource to develop other coping strategies.  Tracey Manning relates that one of her primary triggers for smoking is stress around the incarceration of her son, [name].    The anniversary of her sister's death is tomorrow 04-Jul-2023.  She is tearful when speaking of this, but expresses hope that she live her life in line with the values that her sister represented.    Tracey Manning's birthday is next [birth year] - she is hopeful to be home to celebrate with family.

## 2019-06-13 NOTE — Progress Notes (Addendum)
PROGRESS NOTE    Tracey Manning  OZD:[id number] DOB: 1961-10-04 DOA: 06/11/2019 PCP: Nolene Ebbs, MD   Brief Narrative:  Tracey Manning is Tracey Manning 58 y.o. female with medical history significant of CAD with multiple stents, HTN, HLD, COPD, anxiety presenting with shortness of breath and nonspecific chest pain.   She was admitted for shortness of breath and nonspecific chest pain.  Developed worsening SOB and orthopnea on hospital day 1 requiring transfer to stepdown.  Found to have HF exacerbation with low EF and imaging with edema.  Cardiology following, planning L and R heart cath later this week pending improvement in resp status.  Assessment & Plan:   Principal Problem:   Chest pain Active Problems:   Essential hypertension   Hyperlipidemia   Coronary artery disease  Heart Failure with Reduced Ejection Fraction Shortness of Breath   Acute Hypoxic Respiratory Failure   Small Volume Hemoptysis: Imaging without PE, but notable for HF exacerbation with pulm edema and small pleural effusions as well as elevated R heart pressures.   Continue lasix 40 IV BID Strict I/O, daily weights Hydral, imdur Bisoprolol Defer decision of ace/arb/arni to cardiology Echo with EF 25-30% (see report) - this is new Will d/c steroids and abx as I think asthma less likely contributor - continue scheduled and prn nebs Elevated d dimer, but no PE on CTA and no VTE in LE [place] - Lovenox d/c'd Suspect hemoptysis related to pulm edema while on antiplatelets and therapeutic anticoagulation  Atypical chest pain   Hypertensive Urgency -elevated troponin to 43 at presentation, now downtrending -EKG with nonspecific ST-T wave changes -Appreciate cardiology recommendations  - continue ASA, plavix (discontinued by cardiology given hemoptysis above), bisoprolol - d/c lovenox  AKI: likely related to diuresis, possible contribution of contrast.  Continue to follow with diuresis.  Follow UA.  Follow renal US if  worsening.  Asthma: no wheezing noted on exam today.  Suspect SOB due to above.  Continue duonebs and albuterol prn.  Will d/c steroids and abx as I think primary issue is HF.  Essential hypertension - continue norvasc, bisoprolol.  Add hydralazine and imdur. - improving, but still elevated - holding lisinopril and HCTZ for now - prn hydral and labetalol  Hyperlipidemia -Continue statin  GERD /with history of Barrett esophagus -Continue PPI if not tolerating will switch to IV Protonix -Continue Bentyl  Depression/anxiety -Currently stable, not in chronic antidepressant medication,  -PRN Xanax ordered  Obstructive sleep apnea -Not using CPAP, using oxygen nightly  Recent history of bacterial vaginosis -Continue monitoring, no antibiotic recommended at this time  DVT prophylaxis: lovenox, therapeutic dose Code Status: full  Family Communication: none at bedside - discussed with sister on 7/20 and 7/21 Disposition Plan: pending further improvement in SOB  Consultants:   Cardiology  Procedures:  Echo IMPRESSIONS    1. The left ventricle has severely reduced systolic function, with an ejection fraction of 25-30%. The cavity size was moderately dilated. Left ventricular diastolic function could not be evaluated. Left ventricular diffuse hypokinesis.  2. The right ventricle has normal systolic function. The cavity was normal.  3. The mitral valve is grossly normal.  4. The aortic root is normal in size and structure.  5. The tricuspid valve is grossly normal.  6. The aortic valve is tricuspid. Mild thickening of the aortic valve. No stenosis of the aortic valve.  7. Severe global reduction in LV systolic function; moderate LVE; mild MR.  LE [place] Summary: Right: There is no evidence of  deep vein thrombosis in the lower extremity. No cystic structure found in the popliteal fossa. Left: There is no evidence of deep vein thrombosis in the lower extremity. No cystic  structure found in the popliteal fossa.  Antimicrobials:  Anti-infectives (From admission, onward)   Start     Dose/Rate Route Frequency Ordered Stop   06/13/19 1000  azithromycin (ZITHROMAX) tablet 250 mg  Status:  Discontinued     250 mg Oral Daily 06/12/19 1549 06/13/19 0912   06/12/19 1600  azithromycin (ZITHROMAX) tablet 500 mg     500 mg Oral Daily 06/12/19 1549 06/12/19 1719   06/11/19 1800  tinidazole (TINDAMAX) tablet 1,000 mg     1,000 mg Oral Daily after supper 06/11/19 1512 06/16/19 1759     Subjective: Feels Tracey Manning little better today. Still with SOB.   Objective: Vitals:   06/13/19 0501 06/13/19 0757 06/13/19 0856 06/13/19 0900  BP:    129/80  Pulse: 77   86  Resp: 17     Temp:  (!) 97.5 F (36.4 C)    TempSrc:  Oral    SpO2: 99%  100% 100%  Weight:      Height:        Intake/Output Summary (Last 24 hours) at 06/13/2019 1111 Last data filed at 06/13/2019 1000 Gross per 24 hour  Intake 1720 ml  Output 2500 ml  Net -780 ml   Filed Weights   06/11/19 1704 06/12/19 1720 06/13/19 0500  Weight: 93.1 kg 95.3 kg 94.8 kg    Examination:  General: No acute distress. Cardiovascular: Heart sounds show Tracey Manning regular rate, and rhythm. Lungs: Clear to auscultation bilaterally.  Laying down almost flat.  Abdomen: Soft, nontender, nondistended  Neurological: Alert and oriented 3. Moves all extremities 4. Cranial nerves II through XII grossly intact. Skin: Warm and dry. No rashes or lesions. Extremities: No clubbing or cyanosis. Trace edema.  Data Reviewed: I have personally reviewed following labs and imaging studies  CBC: Recent Labs  Lab 06/11/19 1214 06/11/19 1512 06/12/19 1742 06/13/19 0816  WBC 8.1 7.9  --  14.2*  NEUTROABS 7.2  --   --   --   HGB 14.2 14.1 13.9 13.0  HCT 44.3 41.9 43.5 40.2  MCV 85.9 85.3  --  89.7  PLT 232 229  --  892   Basic Metabolic Panel: Recent Labs  Lab 06/11/19 1214 06/11/19 1512 06/12/19 0547 06/13/19 0816  NA 140  --   141 141  K 3.4*  --  3.8 4.4  CL 104  --  106 103  CO2 23  --  23 25  GLUCOSE 120*  --  151* 133*  BUN 16  --  17 25*  CREATININE 1.05* 1.12* 1.03* 1.37*  CALCIUM 9.6  --  9.4 9.2   GFR: Estimated Creatinine Clearance: 47.6 mL/min (Issacc Merlo) (by C-G formula based on SCr of 1.37 mg/dL (H)). Liver Function Tests: Recent Labs  Lab 06/11/19 1214 06/13/19 0816  AST 20 30  ALT 22 51*  ALKPHOS 79 63  BILITOT 0.7 0.6  PROT 8.3* 7.1  ALBUMIN 4.4 3.8   No results for input(s): LIPASE, AMYLASE in the last 168 hours. No results for input(s): AMMONIA in the last 168 hours. Coagulation Profile: No results for input(s): INR, PROTIME in the last 168 hours. Cardiac Enzymes: Recent Labs  Lab 06/11/19 1511 06/11/19 1819 06/11/19 2001  CKTOTAL 98 100 100  CKMB 5.4* 5.1* 4.9   BNP (last 3 results) No results for  input(s): PROBNP in the last 8760 hours. HbA1C: No results for input(s): HGBA1C in the last 72 hours. CBG: No results for input(s): GLUCAP in the last 168 hours. Lipid Profile: No results for input(s): CHOL, HDL, LDLCALC, TRIG, CHOLHDL, LDLDIRECT in the last 72 hours. Thyroid Function Tests: No results for input(s): TSH, T4TOTAL, FREET4, T3FREE, THYROIDAB in the last 72 hours. Anemia Panel: No results for input(s): VITAMINB12, FOLATE, FERRITIN, TIBC, IRON, RETICCTPCT in the last 72 hours. Sepsis Labs: No results for input(s): PROCALCITON, LATICACIDVEN in the last 168 hours.  Recent Results (from the past 240 hour(s))  SARS Coronavirus 2 (CEPHEID- Performed in [facility] hospital lab), Hosp Order     Status: None   Collection Time: 06/11/19 12:14 PM   Specimen: Nasopharyngeal Swab  Result Value Ref Range Status   SARS Coronavirus 2 NEGATIVE NEGATIVE Final    Comment: (NOTE) If result is NEGATIVE SARS-CoV-2 target nucleic acids are NOT DETECTED. The SARS-CoV-2 RNA is generally detectable in upper and lower  respiratory specimens during the acute phase of infection. The lowest    concentration of SARS-CoV-2 viral copies this assay can detect is 250  copies / mL. Exavier Lina negative result does not preclude SARS-CoV-2 infection  and should not be used as the sole basis for treatment or other  patient management decisions.  Torres Hardenbrook negative result may occur with  improper specimen collection / handling, submission of specimen other  than nasopharyngeal swab, presence of viral mutation(s) within the  areas targeted by this assay, and inadequate number of viral copies  (<250 copies / mL). Evelyn Aguinaldo negative result must be combined with clinical  observations, patient history, and epidemiological information. If result is POSITIVE SARS-CoV-2 target nucleic acids are DETECTED. The SARS-CoV-2 RNA is generally detectable in upper and lower  respiratory specimens dur ing the acute phase of infection.  Positive  results are indicative of active infection with SARS-CoV-2.  Clinical  correlation with patient history and other diagnostic information is  necessary to determine patient infection status.  Positive results do  not rule out bacterial infection or co-infection with other viruses. If result is PRESUMPTIVE POSTIVE SARS-CoV-2 nucleic acids MAY BE PRESENT.   Derwood Becraft presumptive positive result was obtained on the submitted specimen  and confirmed on repeat testing.  While 2019 novel coronavirus  (SARS-CoV-2) nucleic acids may be present in the submitted sample  additional confirmatory testing may be necessary for epidemiological  and / or clinical management purposes  to differentiate between  SARS-CoV-2 and other Sarbecovirus currently known to infect humans.  If clinically indicated additional testing with an alternate test  methodology [phone]) is advised. The SARS-CoV-2 RNA is generally  detectable in upper and lower respiratory sp ecimens during the acute  phase of infection. The expected result is Negative. Fact Sheet for Patients:  StrictlyIdeas.no Fact Sheet  for Healthcare Providers: BankingDealers.co.za This test is not yet approved or cleared by the [place] FDA and has been authorized for detection and/or diagnosis of SARS-CoV-2 by FDA under an Emergency Use Authorization (EUA).  This EUA will remain in effect (meaning this test can be used) for the duration of the COVID-19 declaration under Section 564(b)(1) of the Act, 21 U.S.C. section 360bbb-3(b)(1), unless the authorization is terminated or revoked sooner. Performed at [facility], [place] [address] [place], [place] [address]          Radiology Studies: Ct Angio Chest Pe W Or Wo Contrast  Result Date: 06/12/2019 CLINICAL DATA:  Shortness of  breath. EXAM: CT ANGIOGRAPHY CHEST WITH CONTRAST TECHNIQUE: Multidetector CT imaging of the chest was performed using the standard protocol during bolus administration of intravenous contrast. Multiplanar CT image reconstructions and MIPs were obtained to evaluate the vascular anatomy. CONTRAST:  175mL OMNIPAQUE IOHEXOL 350 MG/ML SOLN COMPARISON:  Radiograph earlier this day. FINDINGS: Cardiovascular: There are no filling defects within the pulmonary arteries to suggest pulmonary embolus. Thoracic aorta is normal in caliber. Cannot assess for dissection given phase of contrast, no perivascular stranding. Multi chamber cardiomegaly. Coronary artery calcifications/stents. Contrast refluxes into the hepatic veins and IVC. No pericardial effusion. Mediastinum/Nodes: Small reactive appearing mediastinal and hilar lymph nodes. The esophagus is decompressed. No thyroid nodule. Lungs/Pleura: Bilateral ground-glass opacities, central predominant, consistent with pulmonary edema. Mild smooth septal thickening, also pulmonary edema. Small bilateral pleural effusions, right greater than left. Prominent lower lobe peribronchial thickening which may be congestive. Upper Abdomen: Contrast refluxing into the hepatic veins and  IVC. Hepatic steatosis. Probable left adrenal adenomas. Musculoskeletal: There are no acute or suspicious osseous abnormalities. Review of the MIP images confirms the above findings. IMPRESSION: 1. No pulmonary embolus. 2. CHF with cardiomegaly, pulmonary edema, and small pleural effusions. Elevated right heart pressures with contrast refluxing into the hepatic veins and IVC. 3. Coronary artery calcifications. Aortic Atherosclerosis (ICD10-I70.0). Electronically Signed   By: Keith Rake M.D.   On: 06/12/2019 22:38   Dg Chest Port 1 View  Result Date: 06/12/2019 CLINICAL DATA:  Shortness of breath EXAM: PORTABLE CHEST 1 VIEW COMPARISON:  June 11, 2019 FINDINGS: No pneumothorax. Stable cardiomegaly. The hila and mediastinum are unremarkable. Mild diffuse interstitial opacities. More focal opacity in the right base. No other acute abnormalities. IMPRESSION: 1. Cardiomegaly.  Mild pulmonary edema. 2. The more focal opacity in the right base may represent focal infiltrate such as pneumonia or aspiration versus asymmetric edema. Recommend clinical correlation and follow-up to resolution. Electronically Signed   By: Dorise Bullion [name] M.D   On: 06/12/2019 14:48   Vas [place] Lower Extremity Venous (dvt)  Result Date: 06/12/2019  Lower Venous Study Indications: SOB.  Limitations: Patient positioning. Comparison Study: no prior Performing Technologist: Abram Sander RVS  Examination Guidelines: Ramil Edgington complete evaluation includes B-mode imaging, spectral Doppler, color Doppler, and power Doppler as needed of all accessible portions of each vessel. Bilateral testing is considered an integral part of Najee Manninen complete examination. Limited examinations for reoccurring indications may be performed as noted.  +---------+---------------+---------+-----------+----------+-------+  RIGHT     Compressibility Phasicity Spontaneity Properties Summary  +---------+---------------+---------+-----------+----------+-------+  CFV       Full             Yes       Yes                             +---------+---------------+---------+-----------+----------+-------+  SFJ       Full                                                      +---------+---------------+---------+-----------+----------+-------+  FV Prox   Full                                                      +---------+---------------+---------+-----------+----------+-------+  FV Mid    Full                                                      +---------+---------------+---------+-----------+----------+-------+  FV Distal Full                                                      +---------+---------------+---------+-----------+----------+-------+  PFV       Full                                                      +---------+---------------+---------+-----------+----------+-------+  POP       Full            Yes       Yes                             +---------+---------------+---------+-----------+----------+-------+  PTV       Full                                                      +---------+---------------+---------+-----------+----------+-------+  PERO      Full                                                      +---------+---------------+---------+-----------+----------+-------+   +---------+---------------+---------+-----------+----------+-------+  LEFT      Compressibility Phasicity Spontaneity Properties Summary  +---------+---------------+---------+-----------+----------+-------+  CFV       Full            Yes       Yes                             +---------+---------------+---------+-----------+----------+-------+  SFJ       Full                                                      +---------+---------------+---------+-----------+----------+-------+  FV Prox   Full                                                      +---------+---------------+---------+-----------+----------+-------+  FV Mid    Full                                                       +---------+---------------+---------+-----------+----------+-------+  FV Distal Full                                                      +---------+---------------+---------+-----------+----------+-------+  PFV       Full                                                      +---------+---------------+---------+-----------+----------+-------+  POP       Full            Yes       Yes                             +---------+---------------+---------+-----------+----------+-------+  PTV       Full                                                      +---------+---------------+---------+-----------+----------+-------+  PERO      Full                                                      +---------+---------------+---------+-----------+----------+-------+     Summary: Right: There is no evidence of deep vein thrombosis in the lower extremity. No cystic structure found in the popliteal fossa. Left: There is no evidence of deep vein thrombosis in the lower extremity. No cystic structure found in the popliteal fossa.  *See table(s) above for measurements and observations. Electronically signed by Servando Snare MD on 06/12/2019 at 3:53:02 PM.    Final         Scheduled Meds:  amLODipine  10 mg Oral Daily   aspirin EC  325 mg Oral Daily   atorvastatin  40 mg Oral Daily   Chlorhexidine Gluconate Cloth  6 each Topical Daily   dicyclomine  20 mg Oral TID AC   enoxaparin (LOVENOX) injection  50 mg Subcutaneous Daily   fluticasone furoate-vilanterol  1 puff Inhalation Daily   furosemide  40 mg Intravenous BID   hydrALAZINE  25 mg Oral Q8H   ipratropium-albuterol  3 mL Nebulization TID   isosorbide dinitrate  5 mg Oral TID   loratadine  10 mg Oral QHS   nebivolol  20 mg Oral Daily   neomycin-polymyxin b-dexamethasone  1 drop Left Eye BID   pantoprazole  40 mg Oral BID   potassium chloride  40 mEq Oral BID   sodium chloride flush  3 mL Intravenous Q12H   tinidazole  1,000 mg Oral QPC supper    Continuous Infusions:    LOS: 1 day    Time spent: over 30 min  Fayrene Helper, MD [facility] Pager AMION  If 7PM-7AM, please contact night-coverage [email or web address] Password [email or web address] 06/13/2019, 11:11 AM

## 2019-06-14 DIAGNOSIS — I5021 Acute systolic (congestive) heart failure: Secondary | ICD-10-CM

## 2019-06-14 DIAGNOSIS — I5043 Acute on chronic combined systolic (congestive) and diastolic (congestive) heart failure: Secondary | ICD-10-CM

## 2019-06-14 LAB — COMPREHENSIVE METABOLIC PANEL
ALT: 46 U/L — ABNORMAL HIGH (ref 0–44)
AST: 25 U/L (ref 15–41)
Albumin: 3.7 g/dL (ref 3.5–5.0)
Alkaline Phosphatase: 60 U/L (ref 38–126)
Anion gap: 13 (ref 5–15)
BUN: 41 mg/dL — ABNORMAL HIGH (ref 6–20)
CO2: 26 mmol/L (ref 22–32)
Calcium: 8.7 mg/dL — ABNORMAL LOW (ref 8.9–10.3)
Chloride: 99 mmol/L (ref 98–111)
Creatinine, Ser: 1.81 mg/dL — ABNORMAL HIGH (ref 0.44–1.00)
GFR calc Af Amer: 35 mL/min — ABNORMAL LOW (ref >60)
GFR calc non Af Amer: 30 mL/min — ABNORMAL LOW (ref >60)
Glucose, Bld: 102 mg/dL — ABNORMAL HIGH (ref 70–99)
Potassium: 4.5 mmol/L (ref 3.5–5.1)
Sodium: 138 mmol/L (ref 135–145)
Total Bilirubin: 0.6 mg/dL (ref 0.3–1.2)
Total Protein: 6.8 g/dL (ref 6.5–8.1)

## 2019-06-14 LAB — CBC
HCT: 38.8 % (ref 36.0–46.0)
Hemoglobin: 12.1 g/dL (ref 12.0–15.0)
MCH: 28 pg (ref 26.0–34.0)
MCHC: 31.2 g/dL (ref 30.0–36.0)
MCV: 89.8 fL (ref 80.0–100.0)
Platelets: 213 10*3/uL (ref 150–400)
RBC: 4.32 MIL/uL (ref 3.87–5.11)
RDW: 17.4 % — ABNORMAL HIGH (ref 11.5–15.5)
WBC: 15.2 10*3/uL — ABNORMAL HIGH (ref 4.0–10.5)
nRBC: 0 % (ref 0.0–0.2)

## 2019-06-14 LAB — HEMOGLOBIN A1C
Hgb A1c MFr Bld: 5.9 % — ABNORMAL HIGH (ref 4.8–5.6)
Mean Plasma Glucose: 122.63 mg/dL

## 2019-06-14 LAB — LIPID PANEL
Cholesterol: 220 mg/dL — ABNORMAL HIGH (ref 0–200)
HDL: 57 mg/dL (ref >40)
LDL Cholesterol: 131 mg/dL — ABNORMAL HIGH (ref 0–99)
Total CHOL/HDL Ratio: 3.9 RATIO
Triglycerides: 161 mg/dL — ABNORMAL HIGH (ref <150)
VLDL: 32 mg/dL (ref 0–40)

## 2019-06-14 LAB — MAGNESIUM: Magnesium: 2.2 mg/dL (ref 1.7–2.4)

## 2019-06-14 MED ORDER — SODIUM CHLORIDE 0.9 % IV SOLN
INTRAVENOUS | Status: AC
  Administered 2019-06-14: 10:00:00 via INTRAVENOUS

## 2019-06-14 MED ORDER — SODIUM CHLORIDE 0.9% FLUSH
10.0000 mL | Freq: Two times a day (BID) | INTRAVENOUS | Status: DC
  Administered 2019-06-14 – 2019-06-17 (×4): 10 mL
  Administered 2019-06-17: 20 mL
  Administered 2019-06-18 – 2019-06-20 (×3): 10 mL

## 2019-06-14 MED ORDER — PREDNISONE 20 MG PO TABS
20.0000 mg | ORAL_TABLET | Freq: Every day | ORAL | Status: AC
  Administered 2019-06-16 – 2019-06-17 (×2): 20 mg via ORAL
  Filled 2019-06-14 (×2): qty 1

## 2019-06-14 MED ORDER — METHYLPREDNISOLONE SODIUM SUCC 40 MG IJ SOLR: 40.0000 mg | Freq: Two times a day (BID) | INTRAMUSCULAR | Status: DC

## 2019-06-14 MED ORDER — SODIUM CHLORIDE 0.9% FLUSH: 10.0000 mL | INTRAVENOUS | Status: DC | PRN

## 2019-06-14 NOTE — Progress Notes (Signed)
PROGRESS NOTE    Tracey Manning  QAS:[id number] DOB: 22-Dec-1960 DOA: 06/11/2019 PCP: Nolene Ebbs, MD   Brief Narrative:  Tracey Manning is a 58 y.o. female with medical history significant of CAD with multiple stents, HTN, HLD, COPD, anxiety presenting with shortness of breath and nonspecific chest pain.   She was admitted for shortness of breath and nonspecific chest pain.  Developed worsening SOB and orthopnea on hospital day 1 requiring transfer to stepdown.  Found to have HF exacerbation with low EF and imaging with edema.  Cardiology following, planning L and R heart cath later this week pending improvement in resp status.  Subjective No sob. Some CP overnight. Still coughing up blood (doing this since she has been on oxygen over a year now) Creat up at 1.8 from 1.3.  Assessment & Plan:  Acute systolic CHF with shortness of breath: EF 25 to 30% from previous 60 to 60%.  Due to uptrending creatinine Lasix held.  Gentle fluids for fevers today.  Monitor intake output, daily weight.  Continue hydralazine, bisoprolol, Imdur.  Cardiomyopathy with systolic CHF EF 25 to 79% from previous 60-65%: Plan is for cardiac  cath if creatinine stable tomorrow.  Patient cardiology input.  Atypical chest pain with elevated troponin to 43 at presentation, now downtrended.EKG with nonspecific ST-T wave changes. Seen by cardio- continue ASA, plavix (discontinued by cardiology given hemoptysis above), bisoprolol. For cath in am  Shortness of breath and wheezing/Asthma/Chronic hypoxic resp failure- on o2 at 2l [place]: Has wheezing-reports he has history of COPD?/Current active smoker. Add iv steroid-and switch to p.o., cont breo ellipta, duoneb, [place] o2. atititussives.Continue bronchodilators.  Elevated d dimer, but no PE on CTA and no VTE in LE [place] - Lovenox d/c'd  Hemoptysis:? related to pulm edema while on antiplatelets and therapeutic anticoagulation.  But patient reports she has been having this  problem ever since being on oxygen.  Monitor.  Uncontrolled hypertension blood pressure peaked at 202 x 149.  Currently stable, continue Isordil, amlodipine to continue on decreased dose due to low EF. holding lisinopril and HCTZ for now. prn hydral and labetalol  AKI: likely related to diuresis, possible contribution of contrast.  Creatinine continues to uptrend.  Will hydrate gently as he also will need cath.  Holding Lasix for now.  Repeat labs in the morning. Obtain renal US. Recent Labs  Lab 06/11/19 1214 06/11/19 1512 06/12/19 0547 06/13/19 0816 06/14/19 0216  BUN 16  --  17 25* 41*  CREATININE 1.05* 1.12* 1.03* 1.37* 1.81*   Tobacco abuse: quit smokingx1 wk-encouraged.  Hyperlipidemia: Continue statin  GERD /with history of Barrett esophagus: cont ppi, Bentyl  Depression/anxiety:Currently stable, not in chronic antidepressant medication, cont PRN Xanax  OSA:Not using CPAP, using oxygen nightly  Recent history of bacterial vaginosis: Continue monitoring, no antibiotic recommended at this time  DVT prophylaxis: lovenox, therapeutic dose Code Status: full  Family Communication discussed with the patient  Disposition Plan: Remains inpatient pending clinical improvement.  Possible cardiac cath tomorrow if creatinine is stable.   Consultants:   Cardiology  Procedures:  Echo IMPRESSIONS    1. The left ventricle has severely reduced systolic function, with an ejection fraction of 25-30%. The cavity size was moderately dilated. Left ventricular diastolic function could not be evaluated. Left ventricular diffuse hypokinesis.  2. The right ventricle has normal systolic function. The cavity was normal.  3. The mitral valve is grossly normal.  4. The aortic root is normal in size and structure.  5. The  tricuspid valve is grossly normal.  6. The aortic valve is tricuspid. Mild thickening of the aortic valve. No stenosis of the aortic valve.  7. Severe global reduction  in LV systolic function; moderate LVE; mild MR.  LE [place] Summary: Right: There is no evidence of deep vein thrombosis in the lower extremity. No cystic structure found in the popliteal fossa. Left: There is no evidence of deep vein thrombosis in the lower extremity. No cystic structure found in the popliteal fossa.  Antimicrobials:  Anti-infectives (From admission, onward)   Start     Dose/Rate Route Frequency Ordered Stop   06/13/19 1000  azithromycin (ZITHROMAX) tablet 250 mg  Status:  Discontinued     250 mg Oral Daily 06/12/19 1549 06/13/19 0912   06/12/19 1600  azithromycin (ZITHROMAX) tablet 500 mg     500 mg Oral Daily 06/12/19 1549 06/12/19 1719   06/11/19 1800  tinidazole (TINDAMAX) tablet 1,000 mg     1,000 mg Oral Daily after supper 06/11/19 1512 06/16/19 1759      Objective: Vitals:   06/14/19 0000 06/14/19 0020 06/14/19 0100 06/14/19 0300  BP: 135/90  (!) 137/95   Pulse: 85  77   Resp: (!) 22  17   Temp:  (!) 97.5 F (36.4 C)  97.7 F (36.5 C)  TempSrc:  Oral  Oral  SpO2: 94%  95%   Weight:      Height:        Intake/Output Summary (Last 24 hours) at 06/14/2019 0724 Last data filed at 06/13/2019 2148 Gross per 24 hour  Intake 1447 ml  Output 700 ml  Net 747 ml   Filed Weights   06/11/19 1704 06/12/19 1720 06/13/19 0500  Weight: 93.1 kg 95.3 kg 94.8 kg    Examination: General exam: Calm, comfortable, not in acute distress, older for age, average built.  HEENT:Oral mucosa moist, Ear/Nose WNL grossly, dentition normal. Respiratory system: Bilateral equal air entry bu q/l exp wheezing.  no use of accessory muscle, non tender on palpation. Cardiovascular system: regular rate and rhythm, S1 & S2 heard, No JVD/murmurs. Gastrointestinal system: Abdomen soft, non-tender, non-distended, BS +. No hepatosplenomegaly palpable. Nervous System:Alert, awake and oriented at baseline. Able to move UE and LE, sensation intact. Extremities No edema, distal peripheral pulses  palpable.  Skin: No rashes,no icterus. MSK: Normal muscle bulk,tone, power  Data Reviewed: I have personally reviewed following labs and imaging studies  CBC: Recent Labs  Lab 06/11/19 1214 06/11/19 1512 06/12/19 1742 06/13/19 0816 06/14/19 0216  WBC 8.1 7.9  --  14.2* 15.2*  NEUTROABS 7.2  --   --   --   --   HGB 14.2 14.1 13.9 13.0 12.1  HCT 44.3 41.9 43.5 40.2 38.8  MCV 85.9 85.3  --  89.7 89.8  PLT 232 229  --  216 458   Basic Metabolic Panel: Recent Labs  Lab 06/11/19 1214 06/11/19 1512 06/12/19 0547 06/13/19 0816 06/14/19 0216  NA 140  --  141 141 138  K 3.4*  --  3.8 4.4 4.5  CL 104  --  106 103 99  CO2 23  --  23 25 26   GLUCOSE 120*  --  151* 133* 102*  BUN 16  --  17 25* 41*  CREATININE 1.05* 1.12* 1.03* 1.37* 1.81*  CALCIUM 9.6  --  9.4 9.2 8.7*  MG  --   --   --   --  2.2   GFR: Estimated Creatinine Clearance: 36.1 mL/min (  A) (by C-G formula based on SCr of 1.81 mg/dL (H)). Liver Function Tests: Recent Labs  Lab 06/11/19 1214 06/13/19 0816 06/14/19 0216  AST 20 30 25   ALT 22 51* 46*  ALKPHOS 79 63 60  BILITOT 0.7 0.6 0.6  PROT 8.3* 7.1 6.8  ALBUMIN 4.4 3.8 3.7   No results for input(s): LIPASE, AMYLASE in the last 168 hours. No results for input(s): AMMONIA in the last 168 hours. Coagulation Profile: No results for input(s): INR, PROTIME in the last 168 hours. Cardiac Enzymes: Recent Labs  Lab 06/11/19 1511 06/11/19 1819 06/11/19 2001  CKTOTAL 98 100 100  CKMB 5.4* 5.1* 4.9   BNP (last 3 results) No results for input(s): PROBNP in the last 8760 hours. HbA1C: No results for input(s): HGBA1C in the last 72 hours. CBG: No results for input(s): GLUCAP in the last 168 hours. Lipid Profile: Recent Labs    06/14/19 0216  CHOL 220*  HDL 57  LDLCALC 131*  TRIG 161*  CHOLHDL 3.9   Thyroid Function Tests: No results for input(s): TSH, T4TOTAL, FREET4, T3FREE, THYROIDAB in the last 72 hours. Anemia Panel: No results for input(s):  VITAMINB12, FOLATE, FERRITIN, TIBC, IRON, RETICCTPCT in the last 72 hours. Sepsis Labs: No results for input(s): PROCALCITON, LATICACIDVEN in the last 168 hours.  Recent Results (from the past 240 hour(s))  SARS Coronavirus 2 (CEPHEID- Performed in [place] hospital lab), Hosp Order     Status: None   Collection Time: 06/11/19 12:14 PM   Specimen: Nasopharyngeal Swab  Result Value Ref Range Status   SARS Coronavirus 2 NEGATIVE NEGATIVE Final    Comment: (NOTE) If result is NEGATIVE SARS-CoV-2 target nucleic acids are NOT DETECTED. The SARS-CoV-2 RNA is generally detectable in upper and lower  respiratory specimens during the acute phase of infection. The lowest  concentration of SARS-CoV-2 viral copies this assay can detect is 250  copies / mL. A negative result does not preclude SARS-CoV-2 infection  and should not be used as the sole basis for treatment or other  patient management decisions.  A negative result may occur with  improper specimen collection / handling, submission of specimen other  than nasopharyngeal swab, presence of viral mutation(s) within the  areas targeted by this assay, and inadequate number of viral copies  (<250 copies / mL). A negative result must be combined with clinical  observations, patient history, and epidemiological information. If result is POSITIVE SARS-CoV-2 target nucleic acids are DETECTED. The SARS-CoV-2 RNA is generally detectable in upper and lower  respiratory specimens dur ing the acute phase of infection.  Positive  results are indicative of active infection with SARS-CoV-2.  Clinical  correlation with patient history and other diagnostic information is  necessary to determine patient infection status.  Positive results do  not rule out bacterial infection or co-infection with other viruses. If result is PRESUMPTIVE POSTIVE SARS-CoV-2 nucleic acids MAY BE PRESENT.   A presumptive positive result was obtained on the submitted  specimen  and confirmed on repeat testing.  While 2019 novel coronavirus  (SARS-CoV-2) nucleic acids may be present in the submitted sample  additional confirmatory testing may be necessary for epidemiological  and / or clinical management purposes  to differentiate between  SARS-CoV-2 and other Sarbecovirus currently known to infect humans.  If clinically indicated additional testing with an alternate test  methodology [phone]) is advised. The SARS-CoV-2 RNA is generally  detectable in upper and lower respiratory sp ecimens during the acute  phase  of infection. The expected result is Negative. Fact Sheet for Patients:  StrictlyIdeas.no Fact Sheet for Healthcare Providers: BankingDealers.co.za This test is not yet approved or cleared by the [place] FDA and has been authorized for detection and/or diagnosis of SARS-CoV-2 by FDA under an Emergency Use Authorization (EUA).  This EUA will remain in effect (meaning this test can be used) for the duration of the COVID-19 declaration under Section 564(b)(1) of the Act, 21 U.S.C. section 360bbb-3(b)(1), unless the authorization is terminated or revoked sooner. Performed at [facility], [place] [address] [place], [place] [address]   MRSA PCR Screening     Status: None   Collection Time: 06/13/19  4:16 AM   Specimen: Nasal Mucosa; Nasopharyngeal  Result Value Ref Range Status   MRSA by PCR NEGATIVE NEGATIVE Final    Comment:        The GeneXpert MRSA Assay (FDA approved for NASAL specimens only), is one component of a comprehensive MRSA colonization surveillance program. It is not intended to diagnose MRSA infection nor to guide or monitor treatment for MRSA infections. Performed at [facility], [place] [address], [place], [place] [address]          Radiology Studies: Ct Angio Chest Pe W Or Wo Contrast  Result Date: 06/12/2019 CLINICAL  DATA:  Shortness of breath. EXAM: CT ANGIOGRAPHY CHEST WITH CONTRAST TECHNIQUE: Multidetector CT imaging of the chest was performed using the standard protocol during bolus administration of intravenous contrast. Multiplanar CT image reconstructions and MIPs were obtained to evaluate the vascular anatomy. CONTRAST:  132mL OMNIPAQUE IOHEXOL 350 MG/ML SOLN COMPARISON:  Radiograph earlier this day. FINDINGS: Cardiovascular: There are no filling defects within the pulmonary arteries to suggest pulmonary embolus. Thoracic aorta is normal in caliber. Cannot assess for dissection given phase of contrast, no perivascular stranding. Multi chamber cardiomegaly. Coronary artery calcifications/stents. Contrast refluxes into the hepatic veins and IVC. No pericardial effusion. Mediastinum/Nodes: Small reactive appearing mediastinal and hilar lymph nodes. The esophagus is decompressed. No thyroid nodule. Lungs/Pleura: Bilateral ground-glass opacities, central predominant, consistent with pulmonary edema. Mild smooth septal thickening, also pulmonary edema. Small bilateral pleural effusions, right greater than left. Prominent lower lobe peribronchial thickening which may be congestive. Upper Abdomen: Contrast refluxing into the hepatic veins and IVC. Hepatic steatosis. Probable left adrenal adenomas. Musculoskeletal: There are no acute or suspicious osseous abnormalities. Review of the MIP images confirms the above findings. IMPRESSION: 1. No pulmonary embolus. 2. CHF with cardiomegaly, pulmonary edema, and small pleural effusions. Elevated right heart pressures with contrast refluxing into the hepatic veins and IVC. 3. Coronary artery calcifications. Aortic Atherosclerosis (ICD10-I70.0). Electronically Signed   By: Keith Rake M.D.   On: 06/12/2019 22:38   Dg Chest Port 1 View  Result Date: 06/12/2019 CLINICAL DATA:  Shortness of breath EXAM: PORTABLE CHEST 1 VIEW COMPARISON:  June 11, 2019 FINDINGS: No pneumothorax.  Stable cardiomegaly. The hila and mediastinum are unremarkable. Mild diffuse interstitial opacities. More focal opacity in the right base. No other acute abnormalities. IMPRESSION: 1. Cardiomegaly.  Mild pulmonary edema. 2. The more focal opacity in the right base may represent focal infiltrate such as pneumonia or aspiration versus asymmetric edema. Recommend clinical correlation and follow-up to resolution. Electronically Signed   By: Dorise Bullion [name] M.D   On: 06/12/2019 14:48   Vas [place] Lower Extremity Venous (dvt)  Result Date: 06/12/2019  Lower Venous Study Indications: SOB.  Limitations: Patient positioning. Comparison Study: no prior Performing Technologist: Abram Sander RVS  Examination Guidelines: A complete evaluation includes B-mode imaging, spectral Doppler, color Doppler, and power Doppler as needed of all accessible portions of each vessel. Bilateral testing is considered an integral part of a complete examination. Limited examinations for reoccurring indications may be performed as noted.  +---------+---------------+---------+-----------+----------+-------+  RIGHT     Compressibility Phasicity Spontaneity Properties Summary  +---------+---------------+---------+-----------+----------+-------+  CFV       Full            Yes       Yes                             +---------+---------------+---------+-----------+----------+-------+  SFJ       Full                                                      +---------+---------------+---------+-----------+----------+-------+  FV Prox   Full                                                      +---------+---------------+---------+-----------+----------+-------+  FV Mid    Full                                                      +---------+---------------+---------+-----------+----------+-------+  FV Distal Full                                                      +---------+---------------+---------+-----------+----------+-------+  PFV       Full                                                       +---------+---------------+---------+-----------+----------+-------+  POP       Full            Yes       Yes                             +---------+---------------+---------+-----------+----------+-------+  PTV       Full                                                      +---------+---------------+---------+-----------+----------+-------+  PERO      Full                                                      +---------+---------------+---------+-----------+----------+-------+   +---------+---------------+---------+-----------+----------+-------+  LEFT      Compressibility Phasicity Spontaneity Properties Summary  +---------+---------------+---------+-----------+----------+-------+  CFV       Full            Yes       Yes                             +---------+---------------+---------+-----------+----------+-------+  SFJ       Full                                                      +---------+---------------+---------+-----------+----------+-------+  FV Prox   Full                                                      +---------+---------------+---------+-----------+----------+-------+  FV Mid    Full                                                      +---------+---------------+---------+-----------+----------+-------+  FV Distal Full                                                      +---------+---------------+---------+-----------+----------+-------+  PFV       Full                                                      +---------+---------------+---------+-----------+----------+-------+  POP       Full            Yes       Yes                             +---------+---------------+---------+-----------+----------+-------+  PTV       Full                                                      +---------+---------------+---------+-----------+----------+-------+  PERO      Full                                                       +---------+---------------+---------+-----------+----------+-------+     Summary: Right: There is no evidence of deep vein thrombosis in the lower extremity. No cystic structure found in the popliteal fossa. Left: There is no evidence of deep vein thrombosis in the lower extremity. No cystic structure  found in the popliteal fossa.  *See table(s) above for measurements and observations. Electronically signed by Servando Snare MD on 06/12/2019 at 3:53:02 PM.    Final         Scheduled Meds:  amLODipine  10 mg Oral Daily   aspirin EC  325 mg Oral Daily   atorvastatin  40 mg Oral Daily   Chlorhexidine Gluconate Cloth  6 each Topical Daily   dicyclomine  20 mg Oral TID AC   enoxaparin (LOVENOX) injection  40 mg Subcutaneous Daily   fluticasone furoate-vilanterol  1 puff Inhalation Daily   furosemide  40 mg Intravenous BID   hydrALAZINE  25 mg Oral Q8H   ipratropium-albuterol  3 mL Nebulization TID   isosorbide dinitrate  5 mg Oral TID   loratadine  10 mg Oral QHS   nebivolol  20 mg Oral Daily   neomycin-polymyxin b-dexamethasone  1 drop Left Eye BID   pantoprazole  40 mg Oral BID   potassium chloride  40 mEq Oral BID   sodium chloride flush  3 mL Intravenous Q12H   tinidazole  1,000 mg Oral QPC supper   Continuous Infusions:    LOS: 2 days    Time spent: over 30 min  Antonieta Pert, MD [facility] Pager AMION  If 7PM-7AM, please contact night-coverage [email or web address] Password [facility] 06/14/2019, 7:24 AM

## 2019-06-14 NOTE — Progress Notes (Signed)
Pt coughed up small amt bright red blood and mucus  First noted occurrence 7/20  Night time MD aware

## 2019-06-14 NOTE — Progress Notes (Addendum)
Progress Note  Patient Name: Tracey Manning Date of Encounter: 06/14/2019  Primary Cardiologist: Quay Burow, MD   Subjective   Had CP at 3 am, got pain rx and went to sleep. (thinks she got nitro and it helped, but no nitro charted) Emotionally upset now because sister cannot visit. (RN will try to arrange Facetime) Breathing not great at baseline, could only walk 1/2 block without stopping, at best. Willing to do cath tomorrow.  Inpatient Medications    Scheduled Meds:  amLODipine  10 mg Oral Daily   aspirin EC  325 mg Oral Daily   atorvastatin  40 mg Oral Daily   Chlorhexidine Gluconate Cloth  6 each Topical Daily   dicyclomine  20 mg Oral TID AC   enoxaparin (LOVENOX) injection  40 mg Subcutaneous Daily   fluticasone furoate-vilanterol  1 puff Inhalation Daily   hydrALAZINE  25 mg Oral Q8H   ipratropium-albuterol  3 mL Nebulization TID   isosorbide dinitrate  5 mg Oral TID   loratadine  10 mg Oral QHS   methylPREDNISolone (SOLU-MEDROL) injection  40 mg Intravenous Q12H   nebivolol  20 mg Oral Daily   neomycin-polymyxin b-dexamethasone  1 drop Left Eye BID   pantoprazole  40 mg Oral BID   potassium chloride  40 mEq Oral BID   sodium chloride flush  3 mL Intravenous Q12H   tinidazole  1,000 mg Oral QPC supper   Continuous Infusions:  sodium chloride     PRN Meds: acetaminophen, albuterol, ALPRAZolam, hydrALAZINE, labetalol, LORazepam, nitroGLYCERIN, ondansetron (ZOFRAN) IV, oxyCODONE-acetaminophen **AND** oxyCODONE, zolpidem   Vital Signs    Vitals:   06/14/19 0020 06/14/19 0100 06/14/19 0300 06/14/19 0750  BP:  (!) 137/95    Pulse:  77    Resp:  17    Temp: (!) 97.5 F (36.4 C)  97.7 F (36.5 C) (!) 97.3 F (36.3 C)  TempSrc: Oral  Oral Oral  SpO2:  95%    Weight:      Height:        Intake/Output Summary (Last 24 hours) at 06/14/2019 0845 Last data filed at 06/13/2019 2148 Gross per 24 hour  Intake 1447 ml  Output 700 ml  Net  747 ml   Last 3 Weights 06/13/2019 06/12/2019 06/11/2019  Weight (lbs) 208 lb 15.9 oz 210 lb 1.6 oz 205 lb 4 oz  Weight (kg) 94.8 kg 95.3 kg 93.1 kg      Telemetry    SR, ST, PVC and pairs, bigeminy at times - Personally Reviewed  ECG    No new - Personally Reviewed  Physical Exam   General: Well developed, well nourished, female in mild respiratory distress Head: Eyes PERRLA, No xanthomas.   Normocephalic and atraumatic Lungs: Insp and exp wheeze. Heart: HRRR S1 S2, without MRG.  Pulses are 2+ & equal. JVD 9 cm Abdomen: Bowel sounds are present, abdomen soft and non-tender without masses or  hernias noted. Msk: Normal strength and tone for age. Extremities: No clubbing, cyanosis or edema.    Skin:  No rashes or lesions noted. Neuro: Alert and oriented X 3. Psych:  Good affect, responds appropriately  Labs    High Sensitivity Troponin:   Recent Labs  Lab 06/11/19 1214 06/11/19 1511 06/12/19 0547  TROPONINIHS 43* 33.0* 29.0*   Lab Results  Component Value Date   CKTOTAL 100 06/11/2019   CKMB 4.9 06/11/2019   TROPONINI <0.03 11/04/2017     Chemistry Recent Labs  Lab 06/11/19 1214  06/12/19 0547 06/13/19 0816 06/14/19 0216  NA 140  --  141 141 138  K 3.4*  --  3.8 4.4 4.5  CL 104  --  106 103 99  CO2 23  --  23 25 26   GLUCOSE 120*  --  151* 133* 102*  BUN 16  --  17 25* 41*  CREATININE 1.05*   < > 1.03* 1.37* 1.81*  CALCIUM 9.6  --  9.4 9.2 8.7*  PROT 8.3*  --   --  7.1 6.8  ALBUMIN 4.4  --   --  3.8 3.7  AST 20  --   --  30 25  ALT 22  --   --  51* 46*  ALKPHOS 79  --   --  63 60  BILITOT 0.7  --   --  0.6 0.6  GFRNONAA 59*   < > >60 43* 30*  GFRAA >60   < > >60 49* 35*  ANIONGAP 13  --  12 13 13    < > = values in this interval not displayed.     Hematology Recent Labs  Lab 06/11/19 1512 06/12/19 1742 06/13/19 0816 06/14/19 0216  WBC 7.9  --  14.2* 15.2*  RBC 4.91  --  4.48 4.32  HGB 14.1 13.9 13.0 12.1  HCT 41.9 43.5 40.2 38.8  MCV 85.3   --  89.7 89.8  MCH 28.7  --  29.0 28.0  MCHC 33.7  --  32.3 31.2  RDW 16.0*  --  17.1* 17.4*  PLT 229  --  216 213    BNP Recent Labs  Lab 06/12/19 1411  BNP 600.7*    DDimer  Recent Labs  Lab 06/12/19 1411  DDIMER 0.72*    Lab Results  Component Value Date   CHOL 220 (H) 06/14/2019   HDL 57 06/14/2019   LDLCALC 131 (H) 06/14/2019   TRIG 161 (H) 06/14/2019   CHOLHDL 3.9 06/14/2019     Radiology    Ct Angio Chest Pe W Or Wo Contrast  Result Date: 06/12/2019 CLINICAL DATA:  Shortness of breath. EXAM: CT ANGIOGRAPHY CHEST WITH CONTRAST TECHNIQUE: Multidetector CT imaging of the chest was performed using the standard protocol during bolus administration of intravenous contrast. Multiplanar CT image reconstructions and MIPs were obtained to evaluate the vascular anatomy. CONTRAST:  113mL OMNIPAQUE IOHEXOL 350 MG/ML SOLN COMPARISON:  Radiograph earlier this day. FINDINGS: Cardiovascular: There are no filling defects within the pulmonary arteries to suggest pulmonary embolus. Thoracic aorta is normal in caliber. Cannot assess for dissection given phase of contrast, no perivascular stranding. Multi chamber cardiomegaly. Coronary artery calcifications/stents. Contrast refluxes into the hepatic veins and IVC. No pericardial effusion. Mediastinum/Nodes: Small reactive appearing mediastinal and hilar lymph nodes. The esophagus is decompressed. No thyroid nodule. Lungs/Pleura: Bilateral ground-glass opacities, central predominant, consistent with pulmonary edema. Mild smooth septal thickening, also pulmonary edema. Small bilateral pleural effusions, right greater than left. Prominent lower lobe peribronchial thickening which may be congestive. Upper Abdomen: Contrast refluxing into the hepatic veins and IVC. Hepatic steatosis. Probable left adrenal adenomas. Musculoskeletal: There are no acute or suspicious osseous abnormalities. Review of the MIP images confirms the above findings. IMPRESSION:  1. No pulmonary embolus. 2. CHF with cardiomegaly, pulmonary edema, and small pleural effusions. Elevated right heart pressures with contrast refluxing into the hepatic veins and IVC. 3. Coronary artery calcifications. Aortic Atherosclerosis (ICD10-I70.0). Electronically Signed   By: Keith Rake M.D.   On: 06/12/2019 22:38   Dg Chest  Port 1 View  Result Date: 06/12/2019 CLINICAL DATA:  Shortness of breath EXAM: PORTABLE CHEST 1 VIEW COMPARISON:  June 11, 2019 FINDINGS: No pneumothorax. Stable cardiomegaly. The hila and mediastinum are unremarkable. Mild diffuse interstitial opacities. More focal opacity in the right base. No other acute abnormalities. IMPRESSION: 1. Cardiomegaly.  Mild pulmonary edema. 2. The more focal opacity in the right base may represent focal infiltrate such as pneumonia or aspiration versus asymmetric edema. Recommend clinical correlation and follow-up to resolution. Electronically Signed   By: Dorise Bullion [name] M.D   On: 06/12/2019 14:48   Vas [place] Lower Extremity Venous (dvt)  Result Date: 06/12/2019  Lower Venous Study Indications: SOB.  Limitations: Patient positioning. Comparison Study: no prior Performing Technologist: Abram Sander RVS  Examination Guidelines: A complete evaluation includes B-mode imaging, spectral Doppler, color Doppler, and power Doppler as needed of all accessible portions of each vessel. Bilateral testing is considered an integral part of a complete examination. Limited examinations for reoccurring indications may be performed as noted.  +---------+---------------+---------+-----------+----------+-------+  RIGHT     Compressibility Phasicity Spontaneity Properties Summary  +---------+---------------+---------+-----------+----------+-------+  CFV       Full            Yes       Yes                             +---------+---------------+---------+-----------+----------+-------+  SFJ       Full                                                       +---------+---------------+---------+-----------+----------+-------+  FV Prox   Full                                                      +---------+---------------+---------+-----------+----------+-------+  FV Mid    Full                                                      +---------+---------------+---------+-----------+----------+-------+  FV Distal Full                                                      +---------+---------------+---------+-----------+----------+-------+  PFV       Full                                                      +---------+---------------+---------+-----------+----------+-------+  POP       Full            Yes       Yes                             +---------+---------------+---------+-----------+----------+-------+  PTV       Full                                                      +---------+---------------+---------+-----------+----------+-------+  PERO      Full                                                      +---------+---------------+---------+-----------+----------+-------+   +---------+---------------+---------+-----------+----------+-------+  LEFT      Compressibility Phasicity Spontaneity Properties Summary  +---------+---------------+---------+-----------+----------+-------+  CFV       Full            Yes       Yes                             +---------+---------------+---------+-----------+----------+-------+  SFJ       Full                                                      +---------+---------------+---------+-----------+----------+-------+  FV Prox   Full                                                      +---------+---------------+---------+-----------+----------+-------+  FV Mid    Full                                                      +---------+---------------+---------+-----------+----------+-------+  FV Distal Full                                                      +---------+---------------+---------+-----------+----------+-------+  PFV       Full                                                       +---------+---------------+---------+-----------+----------+-------+  POP       Full            Yes       Yes                             +---------+---------------+---------+-----------+----------+-------+  PTV       Full                                                      +---------+---------------+---------+-----------+----------+-------+  PERO      Full                                                      +---------+---------------+---------+-----------+----------+-------+     Summary: Right: There is no evidence of deep vein thrombosis in the lower extremity. No cystic structure found in the popliteal fossa. Left: There is no evidence of deep vein thrombosis in the lower extremity. No cystic structure found in the popliteal fossa.  *See table(s) above for measurements and observations. Electronically signed by Servando Snare MD on 06/12/2019 at 3:53:02 PM.    Final     Cardiac Studies   Echo 06/12/19 IMPRESSIONS    1. The left ventricle has severely reduced systolic function, with an ejection fraction of 25-30%. The cavity size was moderately dilated. Left ventricular diastolic function could not be evaluated. Left ventricular diffuse hypokinesis.  2. The right ventricle has normal systolic function. The cavity was normal.  3. The mitral valve is grossly normal.  4. The aortic root is normal in size and structure.  5. The tricuspid valve is grossly normal.  6. The aortic valve is tricuspid. Mild thickening of the aortic valve. No stenosis of the aortic valve.  7. Severe global reduction in LV systolic function; moderate LVE; mild MR.  FINDINGS  Left Ventricle: The left ventricle has severely reduced systolic function, with an ejection fraction of 25-30%. The cavity size was moderately dilated. There is no increase in left ventricular wall thickness. Left ventricular diastolic function could  not be evaluated. Left ventricular diffuse  hypokinesis.  Right Ventricle: The right ventricle has normal systolic function. The cavity was normal.  Left Atrium: Left atrial size was normal in size.  Right Atrium: Right atrial size was normal in size. Right atrial pressure is estimated at 10 mmHg.  Interatrial Septum: No atrial level shunt detected by color flow Doppler.  Pericardium: There is no evidence of pericardial effusion.  Mitral Valve: The mitral valve is grossly normal. Mitral valve regurgitation is mild by color flow Doppler.  Tricuspid Valve: The tricuspid valve is grossly normal. Tricuspid valve regurgitation was not visualized by color flow Doppler.  Aortic Valve: The aortic valve is tricuspid Mild thickening of the aortic valve. Aortic valve regurgitation was not visualized by color flow Doppler. There is No stenosis of the aortic valve.  Pulmonic Valve: The pulmonic valve was not well visualized. Pulmonic valve regurgitation is not visualized by color flow Doppler.  Aorta: The aortic root is normal in size and structure.  Venous: The inferior vena cava is normal in size with greater than 50% respiratory variability.  Additional Comments: Severe global reduction in LV systolic function; moderate LVE; mild MR.    Patient Profile     58 y.o. female with a hx of MI and CAD and multiple stents, tobacco use,  HTN and HLD now with admit for SOB/Asthma and new cardiomyopathy.   Assessment & Plan    Acute SOB combination of Asthma and Acute Systolic CHF  - was on Lasix 20 mg IV bid>>40 mg IV bid>>d/c'd this am - breathing better w/ nebs and steroids (being tapered) but still wheezing - I/O net -113 ml since admit, wt today is trending up - breathing better, can lie almost flat w/ O2  - BUN/Cr have increased w/ diuresis so will  not give more Lasix, follow  Cardiomyopathy new-  - EF 60-65%>>25-30%, no WMA - on Bystolic, Isordil, hydralazine - was on lisinopril PTA, d/c'd on admission, add Entresto  if Cr stable post-cath - SBP 120s-130s on current rx  HTN uncontrolled  - peak BP 202/149, much improved - amlodipine is not best med w/ decreased EF, consider decrease to 5 mg qd and increase Isordil - renal function not stable enough to add ARB/Entresto  CAD  - hx stent to CFX; s/p DES LAD, w/ occluded RCA 2012 cath, MV ok 2015 - mild elevation HS trop, CKMB ok - The risks and benefits of a cardiac catheterization including, but not limited to, death, stroke, MI, kidney damage and bleeding were discussed with the patient who indicates understanding and agrees to proceed.  - Plan is for am, if renal function stable  HLD -  On Lipitor 40 mg - pt says taking rx but eating butter like it was candy, and many fried foods. - says will change diet, prefers not to change Lipitor dose right now. - refer to Life Coach  Tobacco use  - motivated to try to quit  Elevated ddimer - neg PE study, no DVT   CKD 3  - BUN/Cr continue to increase after dye and Lasix - now off Lasix, continue to follow     For questions or updates, please contact [place] Please consult [email or web address] for contact info under        Signed, Rosaria Ferries, PA-C  06/14/2019, 8:45 AM    Much improved from admission on hemoptysis. Plavix held pending cath would need to be reloaded if intervention done Holding diuretics for cath Should be ok for right heart cath cors only in am Needs to stop smoking Exam with minimal exp wheezing compared to admission   Jenkins Rouge

## 2019-06-14 NOTE — H&P (View-Only) (Signed)
Progress Note  Patient Name: Tracey Manning Date of Encounter: 06/14/2019  Primary Cardiologist: Quay Burow, MD   Subjective   Had CP at 3 am, got pain rx and went to sleep. (thinks she got nitro and it helped, but no nitro charted) Emotionally upset now because sister cannot visit. (RN will try to arrange Facetime) Breathing not great at baseline, could only walk 1/2 block without stopping, at best. Willing to do cath tomorrow.  Inpatient Medications    Scheduled Meds:  amLODipine  10 mg Oral Daily   aspirin EC  325 mg Oral Daily   atorvastatin  40 mg Oral Daily   Chlorhexidine Gluconate Cloth  6 each Topical Daily   dicyclomine  20 mg Oral TID AC   enoxaparin (LOVENOX) injection  40 mg Subcutaneous Daily   fluticasone furoate-vilanterol  1 puff Inhalation Daily   hydrALAZINE  25 mg Oral Q8H   ipratropium-albuterol  3 mL Nebulization TID   isosorbide dinitrate  5 mg Oral TID   loratadine  10 mg Oral QHS   methylPREDNISolone (SOLU-MEDROL) injection  40 mg Intravenous Q12H   nebivolol  20 mg Oral Daily   neomycin-polymyxin b-dexamethasone  1 drop Left Eye BID   pantoprazole  40 mg Oral BID   potassium chloride  40 mEq Oral BID   sodium chloride flush  3 mL Intravenous Q12H   tinidazole  1,000 mg Oral QPC supper   Continuous Infusions:  sodium chloride     PRN Meds: acetaminophen, albuterol, ALPRAZolam, hydrALAZINE, labetalol, LORazepam, nitroGLYCERIN, ondansetron (ZOFRAN) IV, oxyCODONE-acetaminophen **AND** oxyCODONE, zolpidem   Vital Signs    Vitals:   06/14/19 0020 06/14/19 0100 06/14/19 0300 06/14/19 0750  BP:  (!) 137/95    Pulse:  77    Resp:  17    Temp: (!) 97.5 F (36.4 C)  97.7 F (36.5 C) (!) 97.3 F (36.3 C)  TempSrc: Oral  Oral Oral  SpO2:  95%    Weight:      Height:        Intake/Output Summary (Last 24 hours) at 06/14/2019 0845 Last data filed at 06/13/2019 2148 Gross per 24 hour  Intake 1447 ml  Output 700 ml  Net  747 ml   Last 3 Weights 06/13/2019 06/12/2019 06/11/2019  Weight (lbs) 208 lb 15.9 oz 210 lb 1.6 oz 205 lb 4 oz  Weight (kg) 94.8 kg 95.3 kg 93.1 kg      Telemetry    SR, ST, PVC and pairs, bigeminy at times - Personally Reviewed  ECG    No new - Personally Reviewed  Physical Exam   General: Well developed, well nourished, female in mild respiratory distress Head: Eyes PERRLA, No xanthomas.   Normocephalic and atraumatic Lungs: Insp and exp wheeze. Heart: HRRR S1 S2, without MRG.  Pulses are 2+ & equal. JVD 9 cm Abdomen: Bowel sounds are present, abdomen soft and non-tender without masses or  hernias noted. Msk: Normal strength and tone for age. Extremities: No clubbing, cyanosis or edema.    Skin:  No rashes or lesions noted. Neuro: Alert and oriented X 3. Psych:  Good affect, responds appropriately  Labs    High Sensitivity Troponin:   Recent Labs  Lab 06/11/19 1214 06/11/19 1511 06/12/19 0547  TROPONINIHS 43* 33.0* 29.0*   Lab Results  Component Value Date   CKTOTAL 100 06/11/2019   CKMB 4.9 06/11/2019   TROPONINI <0.03 11/04/2017     Chemistry Recent Labs  Lab 06/11/19 1214  06/12/19 0547 06/13/19 0816 06/14/19 0216  NA 140  --  141 141 138  K 3.4*  --  3.8 4.4 4.5  CL 104  --  106 103 99  CO2 23  --  23 25 26   GLUCOSE 120*  --  151* 133* 102*  BUN 16  --  17 25* 41*  CREATININE 1.05*   < > 1.03* 1.37* 1.81*  CALCIUM 9.6  --  9.4 9.2 8.7*  PROT 8.3*  --   --  7.1 6.8  ALBUMIN 4.4  --   --  3.8 3.7  AST 20  --   --  30 25  ALT 22  --   --  51* 46*  ALKPHOS 79  --   --  63 60  BILITOT 0.7  --   --  0.6 0.6  GFRNONAA 59*   < > >60 43* 30*  GFRAA >60   < > >60 49* 35*  ANIONGAP 13  --  12 13 13    < > = values in this interval not displayed.     Hematology Recent Labs  Lab 06/11/19 1512 06/12/19 1742 06/13/19 0816 06/14/19 0216  WBC 7.9  --  14.2* 15.2*  RBC 4.91  --  4.48 4.32  HGB 14.1 13.9 13.0 12.1  HCT 41.9 43.5 40.2 38.8  MCV 85.3   --  89.7 89.8  MCH 28.7  --  29.0 28.0  MCHC 33.7  --  32.3 31.2  RDW 16.0*  --  17.1* 17.4*  PLT 229  --  216 213    BNP Recent Labs  Lab 06/12/19 1411  BNP 600.7*    DDimer  Recent Labs  Lab 06/12/19 1411  DDIMER 0.72*    Lab Results  Component Value Date   CHOL 220 (H) 06/14/2019   HDL 57 06/14/2019   LDLCALC 131 (H) 06/14/2019   TRIG 161 (H) 06/14/2019   CHOLHDL 3.9 06/14/2019     Radiology    Ct Angio Chest Pe W Or Wo Contrast  Result Date: 06/12/2019 CLINICAL DATA:  Shortness of breath. EXAM: CT ANGIOGRAPHY CHEST WITH CONTRAST TECHNIQUE: Multidetector CT imaging of the chest was performed using the standard protocol during bolus administration of intravenous contrast. Multiplanar CT image reconstructions and MIPs were obtained to evaluate the vascular anatomy. CONTRAST:  16mL OMNIPAQUE IOHEXOL 350 MG/ML SOLN COMPARISON:  Radiograph earlier this day. FINDINGS: Cardiovascular: There are no filling defects within the pulmonary arteries to suggest pulmonary embolus. Thoracic aorta is normal in caliber. Cannot assess for dissection given phase of contrast, no perivascular stranding. Multi chamber cardiomegaly. Coronary artery calcifications/stents. Contrast refluxes into the hepatic veins and IVC. No pericardial effusion. Mediastinum/Nodes: Small reactive appearing mediastinal and hilar lymph nodes. The esophagus is decompressed. No thyroid nodule. Lungs/Pleura: Bilateral ground-glass opacities, central predominant, consistent with pulmonary edema. Mild smooth septal thickening, also pulmonary edema. Small bilateral pleural effusions, right greater than left. Prominent lower lobe peribronchial thickening which may be congestive. Upper Abdomen: Contrast refluxing into the hepatic veins and IVC. Hepatic steatosis. Probable left adrenal adenomas. Musculoskeletal: There are no acute or suspicious osseous abnormalities. Review of the MIP images confirms the above findings. IMPRESSION:  1. No pulmonary embolus. 2. CHF with cardiomegaly, pulmonary edema, and small pleural effusions. Elevated right heart pressures with contrast refluxing into the hepatic veins and IVC. 3. Coronary artery calcifications. Aortic Atherosclerosis (ICD10-I70.0). Electronically Signed   By: Keith Rake M.D.   On: 06/12/2019 22:38   Dg Chest  Port 1 View  Result Date: 06/12/2019 CLINICAL DATA:  Shortness of breath EXAM: PORTABLE CHEST 1 VIEW COMPARISON:  June 11, 2019 FINDINGS: No pneumothorax. Stable cardiomegaly. The hila and mediastinum are unremarkable. Mild diffuse interstitial opacities. More focal opacity in the right base. No other acute abnormalities. IMPRESSION: 1. Cardiomegaly.  Mild pulmonary edema. 2. The more focal opacity in the right base may represent focal infiltrate such as pneumonia or aspiration versus asymmetric edema. Recommend clinical correlation and follow-up to resolution. Electronically Signed   By: Dorise Bullion [name] M.D   On: 06/12/2019 14:48   Vas [place] Lower Extremity Venous (dvt)  Result Date: 06/12/2019  Lower Venous Study Indications: SOB.  Limitations: Patient positioning. Comparison Study: no prior Performing Technologist: Abram Sander RVS  Examination Guidelines: A complete evaluation includes B-mode imaging, spectral Doppler, color Doppler, and power Doppler as needed of all accessible portions of each vessel. Bilateral testing is considered an integral part of a complete examination. Limited examinations for reoccurring indications may be performed as noted.  +---------+---------------+---------+-----------+----------+-------+  RIGHT     Compressibility Phasicity Spontaneity Properties Summary  +---------+---------------+---------+-----------+----------+-------+  CFV       Full            Yes       Yes                             +---------+---------------+---------+-----------+----------+-------+  SFJ       Full                                                       +---------+---------------+---------+-----------+----------+-------+  FV Prox   Full                                                      +---------+---------------+---------+-----------+----------+-------+  FV Mid    Full                                                      +---------+---------------+---------+-----------+----------+-------+  FV Distal Full                                                      +---------+---------------+---------+-----------+----------+-------+  PFV       Full                                                      +---------+---------------+---------+-----------+----------+-------+  POP       Full            Yes       Yes                             +---------+---------------+---------+-----------+----------+-------+  PTV       Full                                                      +---------+---------------+---------+-----------+----------+-------+  PERO      Full                                                      +---------+---------------+---------+-----------+----------+-------+   +---------+---------------+---------+-----------+----------+-------+  LEFT      Compressibility Phasicity Spontaneity Properties Summary  +---------+---------------+---------+-----------+----------+-------+  CFV       Full            Yes       Yes                             +---------+---------------+---------+-----------+----------+-------+  SFJ       Full                                                      +---------+---------------+---------+-----------+----------+-------+  FV Prox   Full                                                      +---------+---------------+---------+-----------+----------+-------+  FV Mid    Full                                                      +---------+---------------+---------+-----------+----------+-------+  FV Distal Full                                                      +---------+---------------+---------+-----------+----------+-------+  PFV       Full                                                       +---------+---------------+---------+-----------+----------+-------+  POP       Full            Yes       Yes                             +---------+---------------+---------+-----------+----------+-------+  PTV       Full                                                      +---------+---------------+---------+-----------+----------+-------+  PERO      Full                                                      +---------+---------------+---------+-----------+----------+-------+     Summary: Right: There is no evidence of deep vein thrombosis in the lower extremity. No cystic structure found in the popliteal fossa. Left: There is no evidence of deep vein thrombosis in the lower extremity. No cystic structure found in the popliteal fossa.  *See table(s) above for measurements and observations. Electronically signed by Servando Snare MD on 06/12/2019 at 3:53:02 PM.    Final     Cardiac Studies   Echo 06/12/19 IMPRESSIONS    1. The left ventricle has severely reduced systolic function, with an ejection fraction of 25-30%. The cavity size was moderately dilated. Left ventricular diastolic function could not be evaluated. Left ventricular diffuse hypokinesis.  2. The right ventricle has normal systolic function. The cavity was normal.  3. The mitral valve is grossly normal.  4. The aortic root is normal in size and structure.  5. The tricuspid valve is grossly normal.  6. The aortic valve is tricuspid. Mild thickening of the aortic valve. No stenosis of the aortic valve.  7. Severe global reduction in LV systolic function; moderate LVE; mild MR.  FINDINGS  Left Ventricle: The left ventricle has severely reduced systolic function, with an ejection fraction of 25-30%. The cavity size was moderately dilated. There is no increase in left ventricular wall thickness. Left ventricular diastolic function could  not be evaluated. Left ventricular diffuse  hypokinesis.  Right Ventricle: The right ventricle has normal systolic function. The cavity was normal.  Left Atrium: Left atrial size was normal in size.  Right Atrium: Right atrial size was normal in size. Right atrial pressure is estimated at 10 mmHg.  Interatrial Septum: No atrial level shunt detected by color flow Doppler.  Pericardium: There is no evidence of pericardial effusion.  Mitral Valve: The mitral valve is grossly normal. Mitral valve regurgitation is mild by color flow Doppler.  Tricuspid Valve: The tricuspid valve is grossly normal. Tricuspid valve regurgitation was not visualized by color flow Doppler.  Aortic Valve: The aortic valve is tricuspid Mild thickening of the aortic valve. Aortic valve regurgitation was not visualized by color flow Doppler. There is No stenosis of the aortic valve.  Pulmonic Valve: The pulmonic valve was not well visualized. Pulmonic valve regurgitation is not visualized by color flow Doppler.  Aorta: The aortic root is normal in size and structure.  Venous: The inferior vena cava is normal in size with greater than 50% respiratory variability.  Additional Comments: Severe global reduction in LV systolic function; moderate LVE; mild MR.    Patient Profile     58 y.o. female with a hx of MI and CAD and multiple stents, tobacco use,  HTN and HLD now with admit for SOB/Asthma and new cardiomyopathy.   Assessment & Plan    Acute SOB combination of Asthma and Acute Systolic CHF  - was on Lasix 20 mg IV bid>>40 mg IV bid>>d/c'd this am - breathing better w/ nebs and steroids (being tapered) but still wheezing - I/O net -113 ml since admit, wt today is trending up - breathing better, can lie almost flat w/ O2  - BUN/Cr have increased w/ diuresis so will  not give more Lasix, follow  Cardiomyopathy new-  - EF 60-65%>>25-30%, no WMA - on Bystolic, Isordil, hydralazine - was on lisinopril PTA, d/c'd on admission, add Entresto  if Cr stable post-cath - SBP 120s-130s on current rx  HTN uncontrolled  - peak BP 202/149, much improved - amlodipine is not best med w/ decreased EF, consider decrease to 5 mg qd and increase Isordil - renal function not stable enough to add ARB/Entresto  CAD  - hx stent to CFX; s/p DES LAD, w/ occluded RCA 2012 cath, MV ok 2015 - mild elevation HS trop, CKMB ok - The risks and benefits of a cardiac catheterization including, but not limited to, death, stroke, MI, kidney damage and bleeding were discussed with the patient who indicates understanding and agrees to proceed.  - Plan is for am, if renal function stable  HLD -  On Lipitor 40 mg - pt says taking rx but eating butter like it was candy, and many fried foods. - says will change diet, prefers not to change Lipitor dose right now. - refer to Life Coach  Tobacco use  - motivated to try to quit  Elevated ddimer - neg PE study, no DVT   CKD 3  - BUN/Cr continue to increase after dye and Lasix - now off Lasix, continue to follow     For questions or updates, please contact [place] Please consult [email or web address] for contact info under        Signed, Rosaria Ferries, PA-C  06/14/2019, 8:45 AM    Much improved from admission on hemoptysis. Plavix held pending cath would need to be reloaded if intervention done Holding diuretics for cath Should be ok for right heart cath cors only in am Needs to stop smoking Exam with minimal exp wheezing compared to admission   Jenkins Rouge

## 2019-06-15 ENCOUNTER — Other Ambulatory Visit: Payer: Self-pay

## 2019-06-15 ENCOUNTER — Encounter (HOSPITAL_COMMUNITY)
Admission: EM | Disposition: A | Discharge status: Home / Self Care | Payer: Self-pay | Source: Home / Self Care | Attending: Internal Medicine

## 2019-06-15 ENCOUNTER — Inpatient Hospital Stay (HOSPITAL_COMMUNITY): Payer: Medicaid Other

## 2019-06-15 DIAGNOSIS — N179 Acute kidney failure, unspecified: Secondary | ICD-10-CM

## 2019-06-15 DIAGNOSIS — I5033 Acute on chronic diastolic (congestive) heart failure: Secondary | ICD-10-CM

## 2019-06-15 DIAGNOSIS — I5021 Acute systolic (congestive) heart failure: Secondary | ICD-10-CM

## 2019-06-15 DIAGNOSIS — R0609 Other forms of dyspnea: Secondary | ICD-10-CM

## 2019-06-15 DIAGNOSIS — I25118 Atherosclerotic heart disease of native coronary artery with other forms of angina pectoris: Secondary | ICD-10-CM

## 2019-06-15 HISTORY — PX: RIGHT/LEFT HEART CATH AND CORONARY ANGIOGRAPHY: CATH118266

## 2019-06-15 LAB — POCT I-STAT EG7
Acid-Base Excess: 2 mmol/L (ref 0.0–2.0)
Acid-Base Excess: 2 mmol/L (ref 0.0–2.0)
Bicarbonate: 27.7 mmol/L (ref 20.0–28.0)
Bicarbonate: 28.3 mmol/L — ABNORMAL HIGH (ref 20.0–28.0)
Calcium, Ion: 1.25 mmol/L (ref 1.15–1.40)
Calcium, Ion: 1.26 mmol/L (ref 1.15–1.40)
HCT: 36 % (ref 36.0–46.0)
HCT: 36 % (ref 36.0–46.0)
Hemoglobin: 12.2 g/dL (ref 12.0–15.0)
Hemoglobin: 12.2 g/dL (ref 12.0–15.0)
O2 Saturation: 68 %
O2 Saturation: 70 %
Potassium: 4.2 mmol/L (ref 3.5–5.1)
Potassium: 4.2 mmol/L (ref 3.5–5.1)
Sodium: 141 mmol/L (ref 135–145)
Sodium: 141 mmol/L (ref 135–145)
TCO2: 29 mmol/L (ref 22–32)
TCO2: 30 mmol/L (ref 22–32)
pCO2, Ven: 49.4 mmHg (ref 44.0–60.0)
pCO2, Ven: 50.1 mmHg (ref 44.0–60.0)
pH, Ven: 7.357 (ref 7.250–7.430)
pH, Ven: 7.359 (ref 7.250–7.430)
pO2, Ven: 37 mmHg (ref 32.0–45.0)
pO2, Ven: 39 mmHg (ref 32.0–45.0)

## 2019-06-15 LAB — CBC
HCT: 39.4 % (ref 36.0–46.0)
HCT: 39.3 % (ref 36.0–46.0)
Hemoglobin: 12.5 g/dL (ref 12.0–15.0)
Hemoglobin: 13.1 g/dL (ref 12.0–15.0)
MCH: 28.1 pg (ref 26.0–34.0)
MCH: 28.7 pg (ref 26.0–34.0)
MCHC: 31.7 g/dL (ref 30.0–36.0)
MCHC: 33.3 g/dL (ref 30.0–36.0)
MCV: 88.5 fL (ref 80.0–100.0)
MCV: 86.2 fL (ref 80.0–100.0)
Platelets: 195 10*3/uL (ref 150–400)
Platelets: 203 10*3/uL (ref 150–400)
RBC: 4.45 MIL/uL (ref 3.87–5.11)
RBC: 4.56 MIL/uL (ref 3.87–5.11)
RDW: 17.1 % — ABNORMAL HIGH (ref 11.5–15.5)
RDW: 16.8 % — ABNORMAL HIGH (ref 11.5–15.5)
WBC: 11.1 10*3/uL — ABNORMAL HIGH (ref 4.0–10.5)
WBC: 9.1 10*3/uL (ref 4.0–10.5)
nRBC: 0 % (ref 0.0–0.2)
nRBC: 0 % (ref 0.0–0.2)

## 2019-06-15 LAB — POCT I-STAT 7, (LYTES, BLD GAS, ICA,H+H)
Acid-Base Excess: 1 mmol/L (ref 0.0–2.0)
Bicarbonate: 26.8 mmol/L (ref 20.0–28.0)
Calcium, Ion: 1.26 mmol/L (ref 1.15–1.40)
HCT: 35 % — ABNORMAL LOW (ref 36.0–46.0)
Hemoglobin: 11.9 g/dL — ABNORMAL LOW (ref 12.0–15.0)
O2 Saturation: 96 %
Potassium: 4.2 mmol/L (ref 3.5–5.1)
Sodium: 139 mmol/L (ref 135–145)
TCO2: 28 mmol/L (ref 22–32)
pCO2 arterial: 47.1 mmHg (ref 32.0–48.0)
pH, Arterial: 7.364 (ref 7.350–7.450)
pO2, Arterial: 86 mmHg (ref 83.0–108.0)

## 2019-06-15 LAB — BASIC METABOLIC PANEL
Anion gap: 7 (ref 5–15)
BUN: 30 mg/dL — ABNORMAL HIGH (ref 6–20)
CO2: 28 mmol/L (ref 22–32)
Calcium: 8.8 mg/dL — ABNORMAL LOW (ref 8.9–10.3)
Chloride: 102 mmol/L (ref 98–111)
Creatinine, Ser: 1.45 mg/dL — ABNORMAL HIGH (ref 0.44–1.00)
GFR calc Af Amer: 46 mL/min — ABNORMAL LOW (ref >60)
GFR calc non Af Amer: 40 mL/min — ABNORMAL LOW (ref >60)
Glucose, Bld: 98 mg/dL (ref 70–99)
Potassium: 4.8 mmol/L (ref 3.5–5.1)
Sodium: 137 mmol/L (ref 135–145)

## 2019-06-15 LAB — CREATININE, SERUM
Creatinine, Ser: 1.39 mg/dL — ABNORMAL HIGH (ref 0.44–1.00)
GFR calc Af Amer: 49 mL/min — ABNORMAL LOW (ref >60)
GFR calc non Af Amer: 42 mL/min — ABNORMAL LOW (ref >60)

## 2019-06-15 SURGERY — RIGHT/LEFT HEART CATH AND CORONARY ANGIOGRAPHY
Anesthesia: LOCAL

## 2019-06-15 MED ORDER — IPRATROPIUM BROMIDE HFA 17 MCG/ACT IN AERS
2.0000 | INHALATION_SPRAY | Freq: Three times a day (TID) | RESPIRATORY_TRACT | Status: DC
  Filled 2019-06-15: qty 12.9

## 2019-06-15 MED ORDER — ISOSORBIDE DINITRATE 10 MG PO TABS
10.0000 mg | ORAL_TABLET | Freq: Three times a day (TID) | ORAL | Status: DC
  Administered 2019-06-15 – 2019-06-17 (×6): 10 mg via ORAL
  Filled 2019-06-15 (×7): qty 1

## 2019-06-15 MED ORDER — LEVALBUTEROL HCL 0.63 MG/3ML IN NEBU
0.6300 mg | INHALATION_SOLUTION | Freq: Three times a day (TID) | RESPIRATORY_TRACT | Status: DC
  Administered 2019-06-16: 0.63 mg via RESPIRATORY_TRACT
  Filled 2019-06-15: qty 3

## 2019-06-15 MED ORDER — LEVALBUTEROL TARTRATE 45 MCG/ACT IN AERO: 2.0000 | INHALATION_SPRAY | Freq: Three times a day (TID) | RESPIRATORY_TRACT | Status: DC

## 2019-06-15 MED ORDER — IPRATROPIUM BROMIDE 0.02 % IN SOLN
0.5000 mg | Freq: Three times a day (TID) | RESPIRATORY_TRACT | Status: DC
  Administered 2019-06-16: 0.5 mg via RESPIRATORY_TRACT
  Filled 2019-06-15: qty 2.5

## 2019-06-15 MED ORDER — FENTANYL CITRATE (PF) 100 MCG/2ML IJ SOLN
INTRAMUSCULAR | Status: DC | PRN
  Administered 2019-06-15 (×2): 25 ug via INTRAVENOUS

## 2019-06-15 MED ORDER — HYDRALAZINE HCL 20 MG/ML IJ SOLN
INTRAMUSCULAR | Status: AC
  Filled 2019-06-15: qty 1

## 2019-06-15 MED ORDER — ACETAMINOPHEN 325 MG PO TABS: 650.0000 mg | ORAL_TABLET | ORAL | Status: DC | PRN

## 2019-06-15 MED ORDER — SODIUM CHLORIDE 0.9 % WEIGHT BASED INFUSION: 0.7500 mL/kg/h | INTRAVENOUS | Status: AC

## 2019-06-15 MED ORDER — ONDANSETRON HCL 4 MG/2ML IJ SOLN
4.0000 mg | Freq: Four times a day (QID) | INTRAMUSCULAR | Status: DC | PRN
  Administered 2019-06-19: 4 mg via INTRAVENOUS
  Filled 2019-06-15: qty 2

## 2019-06-15 MED ORDER — IPRATROPIUM BROMIDE 0.02 % IN SOLN
0.5000 mg | Freq: Three times a day (TID) | RESPIRATORY_TRACT | Status: DC
  Administered 2019-06-15: 0.5 mg via RESPIRATORY_TRACT
  Filled 2019-06-15: qty 2.5

## 2019-06-15 MED ORDER — SODIUM CHLORIDE 0.9 % IV SOLN: 250.0000 mL | INTRAVENOUS | Status: DC | PRN

## 2019-06-15 MED ORDER — ENOXAPARIN SODIUM 40 MG/0.4ML ~~LOC~~ SOLN
40.0000 mg | SUBCUTANEOUS | Status: DC
  Administered 2019-06-16 – 2019-06-20 (×4): 40 mg via SUBCUTANEOUS
  Filled 2019-06-15 (×3): qty 0.4

## 2019-06-15 MED ORDER — HYDRALAZINE HCL 20 MG/ML IJ SOLN: 10.0000 mg | INTRAMUSCULAR | Status: AC | PRN

## 2019-06-15 MED ORDER — MIDAZOLAM HCL 2 MG/2ML IJ SOLN
INTRAMUSCULAR | Status: AC
  Filled 2019-06-15: qty 2

## 2019-06-15 MED ORDER — SODIUM CHLORIDE 0.9 % IV SOLN
250.0000 mL | INTRAVENOUS | Status: DC | PRN
  Administered 2019-06-17 – 2019-06-18 (×2): 250 mL via INTRAVENOUS

## 2019-06-15 MED ORDER — LABETALOL HCL 5 MG/ML IV SOLN: 10.0000 mg | INTRAVENOUS | Status: AC | PRN

## 2019-06-15 MED ORDER — AMLODIPINE BESYLATE 5 MG PO TABS
5.0000 mg | ORAL_TABLET | Freq: Every day | ORAL | Status: DC
  Administered 2019-06-16 – 2019-06-17 (×2): 5 mg via ORAL
  Filled 2019-06-15 (×2): qty 1

## 2019-06-15 MED ORDER — VERAPAMIL HCL 2.5 MG/ML IV SOLN
INTRAVENOUS | Status: AC
  Filled 2019-06-15: qty 2

## 2019-06-15 MED ORDER — HEPARIN SODIUM (PORCINE) 1000 UNIT/ML IJ SOLN
INTRAMUSCULAR | Status: DC | PRN
  Administered 2019-06-15: 5000 [IU] via INTRAVENOUS

## 2019-06-15 MED ORDER — ASPIRIN 81 MG PO CHEW: 81.0000 mg | CHEWABLE_TABLET | ORAL | Status: DC

## 2019-06-15 MED ORDER — LIDOCAINE HCL (PF) 1 % IJ SOLN
INTRAMUSCULAR | Status: AC
  Filled 2019-06-15: qty 30

## 2019-06-15 MED ORDER — FUROSEMIDE 10 MG/ML IJ SOLN
INTRAMUSCULAR | Status: AC
  Filled 2019-06-15: qty 4

## 2019-06-15 MED ORDER — SODIUM CHLORIDE 0.9 % IV SOLN: INTRAVENOUS | Status: DC

## 2019-06-15 MED ORDER — MORPHINE SULFATE (PF) 2 MG/ML IV SOLN
2.0000 mg | INTRAVENOUS | Status: DC | PRN
  Administered 2019-06-15 – 2019-06-19 (×2): 2 mg via INTRAVENOUS
  Filled 2019-06-15 (×2): qty 1

## 2019-06-15 MED ORDER — SODIUM CHLORIDE 0.9 % IV SOLN
INTRAVENOUS | Status: DC
  Administered 2019-06-15: 06:00:00 via INTRAVENOUS

## 2019-06-15 MED ORDER — SODIUM CHLORIDE 0.9% FLUSH
3.0000 mL | Freq: Two times a day (BID) | INTRAVENOUS | Status: DC
  Administered 2019-06-15: 3 mL via INTRAVENOUS

## 2019-06-15 MED ORDER — HEPARIN (PORCINE) IN NACL 1000-0.9 UT/500ML-% IV SOLN
INTRAVENOUS | Status: AC
  Filled 2019-06-15: qty 1000

## 2019-06-15 MED ORDER — FUROSEMIDE 10 MG/ML IJ SOLN
40.0000 mg | Freq: Once | INTRAMUSCULAR | Status: AC
  Administered 2019-06-16: 13:00:00 40 mg via INTRAVENOUS
  Filled 2019-06-15: qty 4

## 2019-06-15 MED ORDER — LEVALBUTEROL HCL 0.63 MG/3ML IN NEBU
0.6300 mg | INHALATION_SOLUTION | Freq: Three times a day (TID) | RESPIRATORY_TRACT | Status: DC
  Administered 2019-06-15: 0.63 mg via RESPIRATORY_TRACT
  Filled 2019-06-15: qty 3

## 2019-06-15 MED ORDER — HYDRALAZINE HCL 20 MG/ML IJ SOLN
INTRAMUSCULAR | Status: DC | PRN
  Administered 2019-06-15: 10 mg via INTRAVENOUS

## 2019-06-15 MED ORDER — IOHEXOL 350 MG/ML SOLN
INTRAVENOUS | Status: DC | PRN
  Administered 2019-06-15: 80 mL via INTRA_ARTERIAL

## 2019-06-15 MED ORDER — MIDAZOLAM HCL 2 MG/2ML IJ SOLN
INTRAMUSCULAR | Status: DC | PRN
  Administered 2019-06-15: 0.5 mg via INTRAVENOUS
  Administered 2019-06-15: 1 mg via INTRAVENOUS

## 2019-06-15 MED ORDER — HEPARIN (PORCINE) IN NACL 1000-0.9 UT/500ML-% IV SOLN
INTRAVENOUS | Status: DC | PRN
  Administered 2019-06-15 (×2): 500 mL

## 2019-06-15 MED ORDER — LIDOCAINE HCL (PF) 1 % IJ SOLN
INTRAMUSCULAR | Status: DC | PRN
  Administered 2019-06-15 (×2): 2 mL

## 2019-06-15 MED ORDER — VERAPAMIL HCL 2.5 MG/ML IV SOLN
INTRAVENOUS | Status: DC | PRN
  Administered 2019-06-15: 10:00:00 10 mL via INTRA_ARTERIAL

## 2019-06-15 MED ORDER — SODIUM CHLORIDE 0.9% FLUSH
3.0000 mL | INTRAVENOUS | Status: DC | PRN
  Administered 2019-06-19: 3 mL via INTRAVENOUS
  Filled 2019-06-15: qty 3

## 2019-06-15 MED ORDER — SODIUM CHLORIDE 0.9% FLUSH: 3.0000 mL | INTRAVENOUS | Status: DC | PRN

## 2019-06-15 MED ORDER — ASPIRIN 81 MG PO CHEW
81.0000 mg | CHEWABLE_TABLET | ORAL | Status: AC
  Administered 2019-06-15: 81 mg via ORAL
  Filled 2019-06-15: qty 1

## 2019-06-15 MED ORDER — FENTANYL CITRATE (PF) 100 MCG/2ML IJ SOLN
INTRAMUSCULAR | Status: AC
  Filled 2019-06-15: qty 2

## 2019-06-15 SURGICAL SUPPLY — 13 items
BAND CMPR LRG ZPHR (HEMOSTASIS) ×1
BAND ZEPHYR COMPRESS 30 LONG (HEMOSTASIS) ×1 IMPLANT
CATH 5FR JL3.5 JR4 ANG PIG MP (CATHETERS) ×1 IMPLANT
CATH BALLN WEDGE 5F 110CM (CATHETERS) ×1 IMPLANT
CATH INFINITI 5FR AL1 (CATHETERS) ×1 IMPLANT
GLIDESHEATH SLEND A-KIT 6F 22G (SHEATH) ×1 IMPLANT
GUIDEWIRE INQWIRE 1.5J.035X260 (WIRE) IMPLANT
INQWIRE 1.5J .035X260CM (WIRE) ×2
KIT HEART LEFT (KITS) ×2 IMPLANT
PACK CARDIAC CATHETERIZATION (CUSTOM PROCEDURE TRAY) ×2 IMPLANT
SHEATH GLIDE SLENDER 4/5FR (SHEATH) ×1 IMPLANT
TRANSDUCER W/STOPCOCK (MISCELLANEOUS) ×2 IMPLANT
TUBING CIL FLEX 10 FLL-RA (TUBING) ×2 IMPLANT

## 2019-06-15 NOTE — Progress Notes (Addendum)
Progress Note  Patient Name: Tracey Manning Date of Encounter: 06/15/2019  Primary Cardiologist: Quay Burow, MD   Subjective   No CP overnight, wheezing much less today  Inpatient Medications    Scheduled Meds: . amLODipine  10 mg Oral Daily  . aspirin EC  325 mg Oral Daily  . atorvastatin  40 mg Oral Daily  . Chlorhexidine Gluconate Cloth  6 each Topical Daily  . dicyclomine  20 mg Oral TID AC  . enoxaparin (LOVENOX) injection  40 mg Subcutaneous Daily  . fluticasone furoate-vilanterol  1 puff Inhalation Daily  . hydrALAZINE  25 mg Oral Q8H  . ipratropium-albuterol  3 mL Nebulization TID  . isosorbide dinitrate  5 mg Oral TID  . loratadine  10 mg Oral QHS  . nebivolol  20 mg Oral Daily  . neomycin-polymyxin b-dexamethasone  1 drop Left Eye BID  . pantoprazole  40 mg Oral BID  . potassium chloride  40 mEq Oral BID  . predniSONE  20 mg Oral Q breakfast  . sodium chloride flush  10-40 mL Intracatheter Q12H  . sodium chloride flush  3 mL Intravenous Q12H  . tinidazole  1,000 mg Oral QPC supper   Continuous Infusions: . sodium chloride    . sodium chloride 10 mL/hr at 06/15/19 0534   PRN Meds: sodium chloride, acetaminophen, albuterol, ALPRAZolam, hydrALAZINE, labetalol, LORazepam, nitroGLYCERIN, ondansetron (ZOFRAN) IV, oxyCODONE-acetaminophen **AND** oxyCODONE, sodium chloride flush, sodium chloride flush, zolpidem   Vital Signs    Vitals:   06/15/19 0500 06/15/19 0535 06/15/19 0600 06/15/19 0700  BP:   126/87 138/88  Pulse:   70 79  Resp:   (!) 22   Temp:      TempSrc:      SpO2:  97% 98% 98%  Weight: 93.6 kg     Height:        Intake/Output Summary (Last 24 hours) at 06/15/2019 0747 Last data filed at 06/15/2019 0600 Gross per 24 hour  Intake 752.57 ml  Output 1400 ml  Net -647.43 ml   Last 3 Weights 06/15/2019 06/14/2019 06/13/2019  Weight (lbs) 206 lb 5.6 oz 212 lb 8.4 oz 208 lb 15.9 oz  Weight (kg) 93.6 kg 96.4 kg 94.8 kg      Telemetry     SR, PVCs - Personally Reviewed  ECG    No new - Personally Reviewed  Physical Exam   General: Well developed, well nourished, female in no acute distress Head: Eyes PERRLA, No xanthomas.   Normocephalic and atraumatic Lungs: Coarse rales bilaterally, slight end-exp wheeze. Heart: HRRR S1 S2, without MRG.  Pulses are 2+ & equal. No JVD. Abdomen: Bowel sounds are present, abdomen soft and non-tender without masses or  hernias noted. Msk: Normal strength and tone for age. Extremities: No clubbing, cyanosis or edema.    Skin:  No rashes or lesions noted. Neuro: Alert and oriented X 3. Psych:  Good affect, responds appropriately  Labs    High Sensitivity Troponin:   Recent Labs  Lab 06/11/19 1214 06/11/19 1511 06/12/19 0547  TROPONINIHS 43* 33.0* 29.0*   Lab Results  Component Value Date   CKTOTAL 100 06/11/2019   CKMB 4.9 06/11/2019   TROPONINI <0.03 11/04/2017   Chemistry Recent Labs  Lab 06/11/19 1214  06/13/19 0816 06/14/19 0216 06/15/19 0501  NA 140   < > 141 138 137  K 3.4*   < > 4.4 4.5 4.8  CL 104   < > 103 99 102  CO2  23   < > 25 26 28   GLUCOSE 120*   < > 133* 102* 98  BUN 16   < > 25* 41* 30*  CREATININE 1.05*   < > 1.37* 1.81* 1.45*  CALCIUM 9.6   < > 9.2 8.7* 8.8*  PROT 8.3*  --  7.1 6.8  --   ALBUMIN 4.4  --  3.8 3.7  --   AST 20  --  30 25  --   ALT 22  --  51* 46*  --   ALKPHOS 79  --  63 60  --   BILITOT 0.7  --  0.6 0.6  --   GFRNONAA 59*   < > 43* 30* 40*  GFRAA >60   < > 49* 35* 46*  ANIONGAP 13   < > 13 13 7    < > = values in this interval not displayed.     Hematology Recent Labs  Lab 06/13/19 0816 06/14/19 0216 06/15/19 0501  WBC 14.2* 15.2* 11.1*  RBC 4.48 4.32 4.45  HGB 13.0 12.1 12.5  HCT 40.2 38.8 39.4  MCV 89.7 89.8 88.5  MCH 29.0 28.0 28.1  MCHC 32.3 31.2 31.7  RDW 17.1* 17.4* 17.1*  PLT 216 213 195    BNP Recent Labs  Lab 06/12/19 1411  BNP 600.7*    DDimer  Recent Labs  Lab 06/12/19 1411  DDIMER 0.72*     Lab Results  Component Value Date   CHOL 220 (H) 06/14/2019   HDL 57 06/14/2019   LDLCALC 131 (H) 06/14/2019   TRIG 161 (H) 06/14/2019   CHOLHDL 3.9 06/14/2019     Radiology    No results found.  Cardiac Studies   Echo 06/12/19 IMPRESSIONS    1. The left ventricle has severely reduced systolic function, with an ejection fraction of 25-30%. The cavity size was moderately dilated. Left ventricular diastolic function could not be evaluated. Left ventricular diffuse hypokinesis.  2. The right ventricle has normal systolic function. The cavity was normal.  3. The mitral valve is grossly normal.  4. The aortic root is normal in size and structure.  5. The tricuspid valve is grossly normal.  6. The aortic valve is tricuspid. Mild thickening of the aortic valve. No stenosis of the aortic valve.  7. Severe global reduction in LV systolic function; moderate LVE; mild MR.  FINDINGS  Left Ventricle: The left ventricle has severely reduced systolic function, with an ejection fraction of 25-30%. The cavity size was moderately dilated. There is no increase in left ventricular wall thickness. Left ventricular diastolic function could  not be evaluated. Left ventricular diffuse hypokinesis.  Right Ventricle: The right ventricle has normal systolic function. The cavity was normal.  Left Atrium: Left atrial size was normal in size.  Right Atrium: Right atrial size was normal in size. Right atrial pressure is estimated at 10 mmHg.  Interatrial Septum: No atrial level shunt detected by color flow Doppler.  Pericardium: There is no evidence of pericardial effusion.  Mitral Valve: The mitral valve is grossly normal. Mitral valve regurgitation is mild by color flow Doppler.  Tricuspid Valve: The tricuspid valve is grossly normal. Tricuspid valve regurgitation was not visualized by color flow Doppler.  Aortic Valve: The aortic valve is tricuspid Mild thickening of the aortic valve.  Aortic valve regurgitation was not visualized by color flow Doppler. There is No stenosis of the aortic valve.  Pulmonic Valve: The pulmonic valve was not well visualized. Pulmonic valve regurgitation  is not visualized by color flow Doppler.  Aorta: The aortic root is normal in size and structure.  Venous: The inferior vena cava is normal in size with greater than 50% respiratory variability.  Additional Comments: Severe global reduction in LV systolic function; moderate LVE; mild MR.    Patient Profile     58 y.o. female with a hx of MI and CAD and multiple stents, tobacco use,  HTN and HLD now with admit for SOB/Asthma and new cardiomyopathy.   Assessment & Plan    Acute SOB combination of Asthma and Acute Systolic CHF  - diuresed w/ Lasix IV>>d/c'd 07/22 due to increased Cr - wheezing has improved - I/O net -760 ml since admit, wt 206 today - on nebs and steroid taper - feel resp close to baseline (but baseline is poor)  Cardiomyopathy new-  - EF 60-65%>>25-30% by echo this admit - continue Bystolic, Isordil, hydralazine - lisinopril d/c'd - once renal function stable, consider Entresto - SBP 100s-130s on current rx  HTN uncontrolled  - BP very elevated on admit>>improved - amlodipine not best rx w/ low EF, will decrease to 5 mg qd and increase isordil, see how tolerated - peak BP 202/149, much improved - consider ARB/Entresto once renal function stable  CAD  - s/p stent CFX,, hx DES LAD w/ occluded RCA 2012 - mild elevation in trop but CKMB ok - The risks and benefits of a cardiac catheterization including, but not limited to, death, stroke, MI, kidney damage and bleeding were discussed with the patient who indicates understanding and agrees to proceed.   HLD - on Lipitor 40 - extremely poor diet pta - Life Coach referral made - continue Lipitor  Tobacco use  - understands she needs to quit  Elevated ddimer - no PE on CT, no DVT  CKD 3  - BUN/Cr  1.37>>1.81 after Lasix and IV dye>>1.45 - follow after cath, minimal dye use     For questions or updates, please contact [place] Please consult [email or web address] for contact info under      Signed, Rosaria Ferries, PA-C  06/15/2019, 7:47 AM    Clinically improved but still high risk with COPD/Asthma still smoking and worsening ischemic DCM. Admitted with combination CHF exacerbation and COPD exacerbation Improved with diuresis. Mild elevation in troponin. History of stenting to Circumflex/LAD and known occlusion of RCA.  For right and left heart cath today to further risk stratify and guide Rx  Jenkins Rouge

## 2019-06-15 NOTE — Progress Notes (Signed)
TR BAND REMOVAL  LOCATION:    Radial  Rt radial  DEFLATED PER PROTOCOL:   yes  TIME BAND OFF / DRESSING APPLIED:    1430/gauze and tegaderm  SITE UPON ARRIVAL:    Level 0  SITE AFTER BAND REMOVAL:    Level 0  CIRCULATION SENSATION AND MOVEMENT:    Within Normal Limits :  Yes, rt hand and fingers warm and pink  COMMENTS:

## 2019-06-15 NOTE — Progress Notes (Signed)
Nutrition Education Note RD working remotely.   RD consulted for nutrition education for low Na diet. Patient has hx which includes HTN and CAD. She is currently out of the room to [facility] for cardiac cath.   RD attempted to see patient yesterday afternoon for education but she was on the phone with family and requested RD stop back later. RT then provided patient with breathing treatment later in the afternoon. RD provided handouts from the [employer] to patient at the time of attempted visit: "Low Sodium Nutrition Therapy", "Sodium Free Flavoring Tips", and " Sodium Content of Foods".  Expect good to fair compliance.  Body mass index is 38.99 kg/m. Pt meets criteria for obesity based on current BMI.  Current diet order is NPO but patient was previously on Heart Healthy diet. No intakes have been documented since admission. Labs and medications reviewed.   No further nutrition interventions warranted at this time. RD contact information provided. If additional nutrition issues arise, please re-consult RD.     Jarome Matin, MS, RD, LDN, [facility] Clinical Dietitian Pager # [phone] After hours/weekend pager # [phone]

## 2019-06-15 NOTE — Progress Notes (Signed)
PROGRESS NOTE    Tracey Manning  ZOX:[id number] DOB: 1960/12/12 DOA: 06/11/2019 PCP: Nolene Ebbs, MD   Brief Narrative:  Tracey Manning is a 58 y.o. female with medical history significant of CAD with multiple stents, HTN, HLD, COPD, anxiety presenting with shortness of breath and nonspecific chest pain.  She was admitted for shortness of breath and nonspecific chest pain.  Developed worsening SOB and orthopnea on hospital day 1 requiring transfer to stepdown.  Found to have HF exacerbation with low EF and imaging with edema.  Cardiology was consulted -planning L and R heart cath   Subjective Seen and examined this morning.  Complains of some shortness of breath, dry cough, no more hemoptysis.On home oxygen setting of 2 L nasal cannula. No acute events overnight. Creat better after gentle IV fluids.  Assessment & Plan:  Acute systolic CHF with shortness of breath: EF 25 to 30% from previous 60 to 65%.  Due to uptrending creatinine Lasix held, placed on gentle fluids-creatinine better today and going for cardiac cath.  Continue hydralazine, bisoprolol, Imdur.  Hopefully resume diuretics soon, as she complaints of shortness of breath.  Cardiomyopathy with systolic CHF EF 25 to 81% from previous 60-65%: Plan is for cardiac  Cath, plan per cardiology as above.  Atypical chest pain with elevated troponin to 43 at presentation, now downtrended.EKG with nonspecific ST-T wave changes. Seen by cardio- continue ASA, plavix (discontinued by cardiology given hemoptysis above), bisoprolol. For cath todau  Shortness of breath and wheezing/Asthma/Chronic hypoxic resp failure- on o2 at 2l [place]: Has had wheezing-she reports he has history of COPD?/Current active smoker. Add iv steroid-and switched to p.o prednjisone from 7/23, cont same, cont breo ellipta, duoneb, supplemental nasal cannula oxygen, antitussives.  Shortness of breath likely multifactorial with underlying respiratory disease as well as CHF.    Elevated d dimer, but no PE on CTA and no VTE in LE [place] - Lovenox d/c'd  Hemoptysis:? related to pulm edema while on antiplatelets and therapeutic anticoagulation.  But patient reports she has been having this problem ever since being on oxygen.  Monitor.  Uncontrolled hypertension blood pressure peaked at 202/149.  Currently fairly stable. Cont Isordil, amlodipine to continue on decreased dose due to low EF. holding lisinopril and HCTZ for now. prn hydral and labetalol  AKI: likely related to diuresis, possible contribution of contrast.  Creatinine down trended today after gentle IV fluids and holding Lasix.  Monitor, will need to restart Lasix based upon her symptoms and renal function.   Renal US unremarkable. Recent Labs  Lab 06/11/19 1214 06/11/19 1512 06/12/19 0547 06/13/19 0816 06/14/19 0216 06/15/19 0501  BUN 16  --  17 25* 41* 30*  CREATININE 1.05* 1.12* 1.03* 1.37* 1.81* 1.45*   Tobacco abuse: quit smokingx1 wk-encouraged.  Hyperlipidemia: Continue statin  GERD /with history of Barrett esophagus: cont ppi, Bentyl  Depression/anxiety:Currently stable, not in chronic antidepressant medication, cont PRN Xanax  OSA:Not using CPAP, using oxygen nightly  Recent history of bacterial vaginosis: Continue monitoring, no antibiotic recommended at this time  DVT prophylaxis: lovenox. Code Status: full  Family Communication discussed with the patient in detail  Disposition Plan: Remains inpatient pending clinical improvement.  She is going for cardiac cath.  Based upon further cath finding and cardiology plan suggested at [place] post cardiac cath informed by the cardiology and patient listed at [facility], will sign out to Dr. [place] Crane who will assume care today post cath.  Consultants:   Cardiology  Procedures:  Echo IMPRESSIONS    1. The left ventricle has severely reduced systolic function, with an ejection fraction of 25-30%. The cavity  size was moderately dilated. Left ventricular diastolic function could not be evaluated. Left ventricular diffuse hypokinesis.  2. The right ventricle has normal systolic function. The cavity was normal.  3. The mitral valve is grossly normal.  4. The aortic root is normal in size and structure.  5. The tricuspid valve is grossly normal.  6. The aortic valve is tricuspid. Mild thickening of the aortic valve. No stenosis of the aortic valve.  7. Severe global reduction in LV systolic function; moderate LVE; mild MR.  LE [place] Summary: Right: There is no evidence of deep vein thrombosis in the lower extremity. No cystic structure found in the popliteal fossa. Left: There is no evidence of deep vein thrombosis in the lower extremity. No cystic structure found in the popliteal fossa.  Antimicrobials:  Anti-infectives (From admission, onward)   Start     Dose/Rate Route Frequency Ordered Stop   06/13/19 1000  azithromycin (ZITHROMAX) tablet 250 mg  Status:  Discontinued     250 mg Oral Daily 06/12/19 1549 06/13/19 0912   06/12/19 1600  azithromycin (ZITHROMAX) tablet 500 mg     500 mg Oral Daily 06/12/19 1549 06/12/19 1719   06/11/19 1800  [MAR Hold]  tinidazole (TINDAMAX) tablet 1,000 mg     (MAR Hold since Thu 06/15/2019 at 0946.Hold Reason: Transfer to a Procedural area.)   1,000 mg Oral Daily after supper 06/11/19 1512 06/16/19 1759      Objective: Vitals:   06/15/19 1051 06/15/19 1056 06/15/19 1101 06/15/19 1106  BP: (!) 177/109 (!) 165/107 (!) 171/101 (!) 159/101  Pulse: 79 93 80 84  Resp: (!) 27 (!) 22 (!) 40 (!) 25  Temp:      TempSrc:      SpO2: 100% 99% 99% 99%  Weight:      Height:        Intake/Output Summary (Last 24 hours) at 06/15/2019 1128 Last data filed at 06/15/2019 0600 Gross per 24 hour  Intake 752.57 ml  Output 1400 ml  Net -647.43 ml   Filed Weights   06/13/19 0500 06/14/19 0931 06/15/19 0500  Weight: 94.8 kg 96.4 kg 93.6 kg    Examination:  General  exam: Comfortable not in acute distress, obese.   HEENT:Oral mucosa moist, Ear/Nose WNL grossly, dentition normal. Respiratory system: Bilateral air entry present but diminished at bases, mild wheezing at the end of inspiration and expiration.  . Cardiovascular system: regular rate and rhythm, S1 & S2 heard, No JVD/murmurs. Gastrointestinal system: Abdomen soft, non-tender, non-distended, BS +. No hepatosplenomegaly palpable. Nervous System:Alert, awake and oriented at baseline.non focal Extremities No edema, distal peripheral pulses palpable.  Skin: No rashes,no icterus. MSK: Normal muscle bulk,tone, power  Data Reviewed: I have personally reviewed following labs and imaging studies  CBC: Recent Labs  Lab 06/11/19 1214 06/11/19 1512 06/12/19 1742 06/13/19 0816 06/14/19 0216 06/15/19 0501  WBC 8.1 7.9  --  14.2* 15.2* 11.1*  NEUTROABS 7.2  --   --   --   --   --   HGB 14.2 14.1 13.9 13.0 12.1 12.5  HCT 44.3 41.9 43.5 40.2 38.8 39.4  MCV 85.9 85.3  --  89.7 89.8 88.5  PLT 232 229  --  216 213 829   Basic Metabolic Panel: Recent Labs  Lab 06/11/19 1214 06/11/19 1512 06/12/19 0547 06/13/19 0816 06/14/19 0216  06/15/19 0501  NA 140  --  141 141 138 137  K 3.4*  --  3.8 4.4 4.5 4.8  CL 104  --  106 103 99 102  CO2 23  --  23 25 26 28   GLUCOSE 120*  --  151* 133* 102* 98  BUN 16  --  17 25* 41* 30*  CREATININE 1.05* 1.12* 1.03* 1.37* 1.81* 1.45*  CALCIUM 9.6  --  9.4 9.2 8.7* 8.8*  MG  --   --   --   --  2.2  --    GFR: Estimated Creatinine Clearance: 44.7 mL/min (A) (by C-G formula based on SCr of 1.45 mg/dL (H)). Liver Function Tests: Recent Labs  Lab 06/11/19 1214 06/13/19 0816 06/14/19 0216  AST 20 30 25   ALT 22 51* 46*  ALKPHOS 79 63 60  BILITOT 0.7 0.6 0.6  PROT 8.3* 7.1 6.8  ALBUMIN 4.4 3.8 3.7   No results for input(s): LIPASE, AMYLASE in the last 168 hours. No results for input(s): AMMONIA in the last 168 hours. Coagulation Profile: No results for  input(s): INR, PROTIME in the last 168 hours. Cardiac Enzymes: Recent Labs  Lab 06/11/19 1511 06/11/19 1819 06/11/19 2001  CKTOTAL 98 100 100  CKMB 5.4* 5.1* 4.9   BNP (last 3 results) No results for input(s): PROBNP in the last 8760 hours. HbA1C: Recent Labs    06/14/19 0216  HGBA1C 5.9*   CBG: No results for input(s): GLUCAP in the last 168 hours. Lipid Profile: Recent Labs    06/14/19 0216  CHOL 220*  HDL 57  LDLCALC 131*  TRIG 161*  CHOLHDL 3.9   Thyroid Function Tests: No results for input(s): TSH, T4TOTAL, FREET4, T3FREE, THYROIDAB in the last 72 hours. Anemia Panel: No results for input(s): VITAMINB12, FOLATE, FERRITIN, TIBC, IRON, RETICCTPCT in the last 72 hours. Sepsis Labs: No results for input(s): PROCALCITON, LATICACIDVEN in the last 168 hours.  Recent Results (from the past 240 hour(s))  SARS Coronavirus 2 (CEPHEID- Performed in [place] hospital lab), Hosp Order     Status: None   Collection Time: 06/11/19 12:14 PM   Specimen: Nasopharyngeal Swab  Result Value Ref Range Status   SARS Coronavirus 2 NEGATIVE NEGATIVE Final    Comment: (NOTE) If result is NEGATIVE SARS-CoV-2 target nucleic acids are NOT DETECTED. The SARS-CoV-2 RNA is generally detectable in upper and lower  respiratory specimens during the acute phase of infection. The lowest  concentration of SARS-CoV-2 viral copies this assay can detect is 250  copies / mL. A negative result does not preclude SARS-CoV-2 infection  and should not be used as the sole basis for treatment or other  patient management decisions.  A negative result may occur with  improper specimen collection / handling, submission of specimen other  than nasopharyngeal swab, presence of viral mutation(s) within the  areas targeted by this assay, and inadequate number of viral copies  (<250 copies / mL). A negative result must be combined with clinical  observations, patient history, and epidemiological information.  If result is POSITIVE SARS-CoV-2 target nucleic acids are DETECTED. The SARS-CoV-2 RNA is generally detectable in upper and lower  respiratory specimens dur ing the acute phase of infection.  Positive  results are indicative of active infection with SARS-CoV-2.  Clinical  correlation with patient history and other diagnostic information is  necessary to determine patient infection status.  Positive results do  not rule out bacterial infection or co-infection with other viruses. If  result is PRESUMPTIVE POSTIVE SARS-CoV-2 nucleic acids MAY BE PRESENT.   A presumptive positive result was obtained on the submitted specimen  and confirmed on repeat testing.  While 2019 novel coronavirus  (SARS-CoV-2) nucleic acids may be present in the submitted sample  additional confirmatory testing may be necessary for epidemiological  and / or clinical management purposes  to differentiate between  SARS-CoV-2 and other Sarbecovirus currently known to infect humans.  If clinically indicated additional testing with an alternate test  methodology [phone]) is advised. The SARS-CoV-2 RNA is generally  detectable in upper and lower respiratory sp ecimens during the acute  phase of infection. The expected result is Negative. Fact Sheet for Patients:  StrictlyIdeas.no Fact Sheet for Healthcare Providers: BankingDealers.co.za This test is not yet approved or cleared by the [place] FDA and has been authorized for detection and/or diagnosis of SARS-CoV-2 by FDA under an Emergency Use Authorization (EUA).  This EUA will remain in effect (meaning this test can be used) for the duration of the COVID-19 declaration under Section 564(b)(1) of the Act, 21 U.S.C. section 360bbb-3(b)(1), unless the authorization is terminated or revoked sooner. Performed at [facility], [place] [address], [place], [place] [address]   MRSA PCR Screening      Status: None   Collection Time: 06/13/19  4:16 AM   Specimen: Nasal Mucosa; Nasopharyngeal  Result Value Ref Range Status   MRSA by PCR NEGATIVE NEGATIVE Final    Comment:        The GeneXpert MRSA Assay (FDA approved for NASAL specimens only), is one component of a comprehensive MRSA colonization surveillance program. It is not intended to diagnose MRSA infection nor to guide or monitor treatment for MRSA infections. Performed at [facility], [place] [address] [place], [place] [address]          Radiology Studies: US Renal  Result Date: 06/15/2019 CLINICAL DATA:  Acute kidney injury EXAM: RENAL / URINARY TRACT ULTRASOUND COMPLETE COMPARISON:  CT 04/13/2018 FINDINGS: Right Kidney: Renal measurements: 10.9 x 4.4 x 4.7 cm = volume: 117 mL. Three cysts within the right kidney, the largest 3.2 cm in the midpole. These are stable since prior CT. No hydronephrosis. Normal echotexture. Left Kidney: Renal measurements: 10.0 x 5.2 x 4.2 cm = volume: 113 mL. Echogenicity within normal limits. No mass or hydronephrosis visualized. Bladder: Decompressed, not visualized. IMPRESSION: No acute findings.  No hydronephrosis. Electronically Signed   By: Rolm Baptise M.D.   On: 06/15/2019 09:28        Scheduled Meds: . [MAR Hold] amLODipine  5 mg Oral Daily  . [MAR Hold] aspirin EC  325 mg Oral Daily  . [MAR Hold] atorvastatin  40 mg Oral Daily  . [MAR Hold] Chlorhexidine Gluconate Cloth  6 each Topical Daily  . [MAR Hold] dicyclomine  20 mg Oral TID AC  . [MAR Hold] enoxaparin (LOVENOX) injection  40 mg Subcutaneous Daily  . [MAR Hold] fluticasone furoate-vilanterol  1 puff Inhalation Daily  . furosemide  40 mg Intravenous Once  . [MAR Hold] hydrALAZINE  25 mg Oral Q8H  . [MAR Hold] ipratropium-albuterol  3 mL Nebulization TID  . [MAR Hold] isosorbide dinitrate  10 mg Oral TID  . [MAR Hold] loratadine  10 mg Oral QHS  . [MAR Hold] nebivolol  20 mg Oral Daily  . [MAR  Hold] neomycin-polymyxin b-dexamethasone  1 drop Left Eye BID  . [MAR Hold] pantoprazole  40 mg Oral BID  . [facility]  Hold] potassium chloride  40 mEq Oral BID  . [MAR Hold] predniSONE  20 mg Oral Q breakfast  . [MAR Hold] sodium chloride flush  10-40 mL Intracatheter Q12H  . [MAR Hold] sodium chloride flush  3 mL Intravenous Q12H  . [MAR Hold] tinidazole  1,000 mg Oral QPC supper   Continuous Infusions: . sodium chloride    . sodium chloride 50 mL/hr at 06/15/19 0809     LOS: 3 days    Time spent: over 69 min  Antonieta Pert, MD [facility] Pager AMION  If 7PM-7AM, please contact night-coverage [email or web address] Password [facility] 06/15/2019, 11:28 AM

## 2019-06-15 NOTE — Interval H&P Note (Signed)
Cath Lab Visit (complete for each Cath Lab visit)  Clinical Evaluation Leading to the Procedure:   ACS: No.  Non-ACS:    Anginal Classification: CCS [name]  Anti-ischemic medical therapy: Maximal Therapy (2 or more classes of medications)  Non-Invasive Test Results: No non-invasive testing performed  Prior CABG: No previous CABG      History and Physical Interval Note:  06/15/2019 9:38 AM  Tracey Manning  has presented today for surgery, with the diagnosis of n stemi - heart failure.  The various methods of treatment have been discussed with the patient and family. After consideration of risks, benefits and other options for treatment, the patient has consented to  Procedure(s): RIGHT/LEFT HEART CATH AND CORONARY ANGIOGRAPHY (N/A) as a surgical intervention.  The patient's history has been reviewed, patient examined, no change in status, stable for surgery.  I have reviewed the patient's chart and labs.  Questions were answered to the patient's satisfaction.     Belva Crome [name]

## 2019-06-15 NOTE — Progress Notes (Signed)
Pt in stable condition for transfer.

## 2019-06-15 NOTE — CV Procedure (Signed)
   Left and right heart cath via righ radial and brachial approach respectively.  Real-time vascular ultrasound used for arterial access.  And antecubital vein IV was exchanged for vascular brachial sheath.   Severe LAD stenosis including disease proximal to the prior LAD stent and in-stent restenosis within a region of significant tortuosity/angulation.  Left main normal  Circumflex third obtuse marginal with overlapping stents contains focal 60% ISR the vessel proximal to the stent contains segmental 50 to 60% narrowing the mid circumflex beyond the first marginal contains focal eccentric 60 to 70% there is 50% first obtuse marginal and 75% stenosis of a codominant branch  RCA arises from an anterior orientation and is totally occluded.  Collaterals from left to right.  This is a codominant anatomy.  Marked elevation in LV pressures with an EDP of 32 mmHg.  Mean pulmonary capillary wedge pressure 35 mmHg  Conclusions: 1.  Severe stenosis in the LAD that involves restenosis and also significant angulation and new de novo disease.  Moderate multifocal disease in the circumflex territory.  Total occlusion of a small codominant RCA.  2.  Acute on chronic combined systolic and diastolic heart failure, decompensated.  RECOMMENDATION: Aggressive management of heart failure; IV Lasix will be given now; 80 cc of contrast used during the procedure however am concerned may develop worsening acute kidney injury and therefore needs to be followed.  Ultimate revascularization therapy to consider would be bypass surgery with LIMA to the LAD and perhaps saphenous vein grafting into the circumflex territories.  This will need to be debated among team members including her primary interventional cardiologist Dr. Quay Burow.

## 2019-06-16 ENCOUNTER — Inpatient Hospital Stay (HOSPITAL_COMMUNITY): Payer: Medicaid Other

## 2019-06-16 ENCOUNTER — Encounter (HOSPITAL_COMMUNITY): Payer: Self-pay | Admitting: Interventional Cardiology

## 2019-06-16 LAB — BASIC METABOLIC PANEL
Anion gap: 10 (ref 5–15)
BUN: 17 mg/dL (ref 6–20)
CO2: 27 mmol/L (ref 22–32)
Calcium: 9 mg/dL (ref 8.9–10.3)
Chloride: 101 mmol/L (ref 98–111)
Creatinine, Ser: 1.27 mg/dL — ABNORMAL HIGH (ref 0.44–1.00)
GFR calc Af Amer: 54 mL/min — ABNORMAL LOW (ref >60)
GFR calc non Af Amer: 47 mL/min — ABNORMAL LOW (ref >60)
Glucose, Bld: 127 mg/dL — ABNORMAL HIGH (ref 70–99)
Potassium: 4.1 mmol/L (ref 3.5–5.1)
Sodium: 138 mmol/L (ref 135–145)

## 2019-06-16 LAB — CBC
HCT: 36 % (ref 36.0–46.0)
Hemoglobin: 12 g/dL (ref 12.0–15.0)
MCH: 28.6 pg (ref 26.0–34.0)
MCHC: 33.3 g/dL (ref 30.0–36.0)
MCV: 85.9 fL (ref 80.0–100.0)
Platelets: 177 10*3/uL (ref 150–400)
RBC: 4.19 MIL/uL (ref 3.87–5.11)
RDW: 16.3 % — ABNORMAL HIGH (ref 11.5–15.5)
WBC: 12.4 10*3/uL — ABNORMAL HIGH (ref 4.0–10.5)
nRBC: 0 % (ref 0.0–0.2)

## 2019-06-16 MED ORDER — LEVALBUTEROL HCL 0.63 MG/3ML IN NEBU
0.6300 mg | INHALATION_SOLUTION | Freq: Two times a day (BID) | RESPIRATORY_TRACT | Status: DC
  Administered 2019-06-16 – 2019-06-17 (×2): 0.63 mg via RESPIRATORY_TRACT
  Filled 2019-06-16 (×2): qty 3

## 2019-06-16 MED ORDER — IPRATROPIUM BROMIDE 0.02 % IN SOLN
0.5000 mg | Freq: Two times a day (BID) | RESPIRATORY_TRACT | Status: DC
  Administered 2019-06-16 – 2019-06-17 (×2): 0.5 mg via RESPIRATORY_TRACT
  Filled 2019-06-16 (×2): qty 2.5

## 2019-06-16 NOTE — Progress Notes (Signed)
PROGRESS NOTE  Tracey Manning HER:[id number] DOB: [birth year] DOA: 06/11/2019 PCP: Tracey Ebbs, MD  HPI/Recap of past 24 hours: Tracey Hendersonis a 58 y.o.femalewith medical history significant ofCAD with multiple stents, HTN, HLD, COPD, anxiety presenting with shortness of breath and nonspecific chest pain.  She was admitted for shortness of breath and nonspecific chest pain.  Developed worsening SOB and orthopnea on hospital day 1 requiring transfer to stepdown.  Found to have HF exacerbation with low EF and imaging with edema.  Cardiology was consulted -planning L and R heart cath.  Transferred to [facility] on 06/15/2019 where she had left and right heart cath. Cath on 7/24 showed occluded RCA with collaterals tight OM and stenosis proximal to stent in mid LAD.  Had moderate pulmonary HTN and PCWP 37 mmHg.  06/16/19: Patient was seen and examined at her bedside.  She reports intermittent chest pressure with some improvement with IV morphine.  Denies dyspnea or palpitations.  Seen by cardiology, not a good candidate for CABG.  Assessment/Plan: Principal Problem:   Dyspnea on exertion Active Problems:   Essential hypertension   Hyperlipidemia   Coronary artery disease   Chest pain   Acute on chronic diastolic HF (heart failure) (HCC)   AKI (acute kidney injury) ([place])  Newly diagnosed cardiomyopathy/acute systolic CHF Post left and right heart cath which showed occluded RCA with collaterals tight OM and stenosis proximal to stent in mid LAD.  Had moderate pulmonary HTN and PCWP 37 mmHg Per cardiology not a good candidate for CABG Last 2D echo done during this admission revealed LVEF 25 to 85% Continue Bystolic, Isordil, hydralazine, once renal function stable consider Entresto per cardiology Strict I's and O's and daily weight  Pulmonary edema likely cardiogenic Independently viewed chest x-ray done on admission which showed cardiomegaly with increase in pulmonary vascularity Maintain  O2 saturation greater than 92% Defer diuretics to cardiology  Uncontrolled hypertension, resolved Continue cardiac medications as stated above  COPD Maintain O2 saturation greater than 92% Continue bronchodilators  CKD 3 Creatinine 1.2 with GFR 54 Avoid nephrotoxins Monitor urine output Repeat BMP in the morning  Leukocytosis, likely reactive post heart cath WBC 12 K Afebrile No sign of active infective process  Coronary artery disease Continue Lipitor and aspirin Not a good candidate for prep as per cardiology due to severe COPD and ongoing tobacco use with poor functional status When CHF is improved cardiology considering PCI/stent of OM and mid LAD  Hyperlipidemia Continue Lipitor  DVT prophylaxis: lovenox. Code Status: full  Family Communication discussed with the patient in detail  Disposition Plan:  Possible discharge to home in 2 to 3 days when cardiology signs off.  Consultants:   Cardiology  Procedures:  Echo IMPRESSIONS   1. The left ventricle has severely reduced systolic function, with an ejection fraction of 25-30%. The cavity size was moderately dilated. Left ventricular diastolic function could not be evaluated. Left ventricular diffuse hypokinesis. 2. The right ventricle has normal systolic function. The cavity was normal. 3. The mitral valve is grossly normal. 4. The aortic root is normal in size and structure. 5. The tricuspid valve is grossly normal. 6. The aortic valve is tricuspid. Mild thickening of the aortic valve. No stenosis of the aortic valve. 7. Severe global reduction in LV systolic function; moderate LVE; mild MR.  LE [place] Summary: Right: There is no evidence of deep vein thrombosis in the lower extremity. No cystic structure found in the popliteal fossa. Left: There is no evidence of deep  vein thrombosis in the lower extremity. No cystic structure found in the popliteal fossa.     Objective: Vitals:   06/16/19  0814 06/16/19 0825 06/16/19 1256 06/16/19 1401  BP: (!) 150/77 (!) 150/77 (!) 146/81 105/75  Pulse:  87  72  Resp:  18  16  Temp:    (!) 97.5 F (36.4 C)  TempSrc:    Axillary  SpO2:  97%  (!) 89%  Weight:      Height:        Intake/Output Summary (Last 24 hours) at 06/16/2019 1443 Last data filed at 06/16/2019 0500 Gross per 24 hour  Intake 1192.53 ml  Output 2700 ml  Net -1507.47 ml   Filed Weights   06/14/19 0931 06/15/19 0500 06/16/19 0500  Weight: 96.4 kg 93.6 kg 91 kg    Exam:  . General: 58 y.o. year-old female well developed well nourished in no acute distress.  Alert and oriented x3. . Cardiovascular: Regular rate and rhythm with no rubs or gallops.  No thyromegaly or JVD noted.   Marland Kitchen Respiratory: Mild rales bilaterally with no wheezes noted.  Poor inspiratory effort. . Abdomen: Soft nontender nondistended with normal bowel sounds x4 quadrants. . Musculoskeletal: No lower extremity edema. 2/4 pulses in all 4 extremities.  Trace dependent edema in upper extremities bilaterally. Marland Kitchen Psychiatry: Mood is appropriate for condition and setting   Data Reviewed: CBC: Recent Labs  Lab 06/11/19 1214  06/13/19 0816 06/14/19 0216 06/15/19 0501 06/15/19 1031 06/15/19 1032 06/15/19 1046 06/15/19 1630 06/16/19 0455  WBC 8.1   < > 14.2* 15.2* 11.1*  --   --   --  9.1 12.4*  NEUTROABS 7.2  --   --   --   --   --   --   --   --   --   HGB 14.2   < > 13.0 12.1 12.5 12.2 12.2 11.9* 13.1 12.0  HCT 44.3   < > 40.2 38.8 39.4 36.0 36.0 35.0* 39.3 36.0  MCV 85.9   < > 89.7 89.8 88.5  --   --   --  86.2 85.9  PLT 232   < > 216 213 195  --   --   --  203 177   < > = values in this interval not displayed.   Basic Metabolic Panel: Recent Labs  Lab 06/12/19 0547 06/13/19 0816 06/14/19 0216 06/15/19 0501 06/15/19 1031 06/15/19 1032 06/15/19 1046 06/15/19 1630 06/16/19 0455  NA 141 141 138 137 141 141 139  --  138  K 3.8 4.4 4.5 4.8 4.2 4.2 4.2  --  4.1  CL 106 103 99 102  --    --   --   --  101  CO2 23 25 26 28   --   --   --   --  27  GLUCOSE 151* 133* 102* 98  --   --   --   --  127*  BUN 17 25* 41* 30*  --   --   --   --  17  CREATININE 1.03* 1.37* 1.81* 1.45*  --   --   --  1.39* 1.27*  CALCIUM 9.4 9.2 8.7* 8.8*  --   --   --   --  9.0  MG  --   --  2.2  --   --   --   --   --   --    GFR: Estimated Creatinine Clearance: 50.2 mL/min (A) (  by C-G formula based on SCr of 1.27 mg/dL (H)). Liver Function Tests: Recent Labs  Lab 06/11/19 1214 06/13/19 0816 06/14/19 0216  AST 20 30 25   ALT 22 51* 46*  ALKPHOS 79 63 60  BILITOT 0.7 0.6 0.6  PROT 8.3* 7.1 6.8  ALBUMIN 4.4 3.8 3.7   No results for input(s): LIPASE, AMYLASE in the last 168 hours. No results for input(s): AMMONIA in the last 168 hours. Coagulation Profile: No results for input(s): INR, PROTIME in the last 168 hours. Cardiac Enzymes: Recent Labs  Lab 06/11/19 1511 06/11/19 1819 06/11/19 2001  CKTOTAL 98 100 100  CKMB 5.4* 5.1* 4.9   BNP (last 3 results) No results for input(s): PROBNP in the last 8760 hours. HbA1C: Recent Labs    06/14/19 0216  HGBA1C 5.9*   CBG: No results for input(s): GLUCAP in the last 168 hours. Lipid Profile: Recent Labs    06/14/19 0216  CHOL 220*  HDL 57  LDLCALC 131*  TRIG 161*  CHOLHDL 3.9   Thyroid Function Tests: No results for input(s): TSH, T4TOTAL, FREET4, T3FREE, THYROIDAB in the last 72 hours. Anemia Panel: No results for input(s): VITAMINB12, FOLATE, FERRITIN, TIBC, IRON, RETICCTPCT in the last 72 hours. Urine analysis:    Component Value Date/Time   COLORURINE YELLOW 06/13/2019 1411   APPEARANCEUR CLOUDY (A) 06/13/2019 1411   LABSPEC 1.021 06/13/2019 1411   PHURINE 5.0 06/13/2019 1411   GLUCOSEU NEGATIVE 06/13/2019 1411   HGBUR NEGATIVE 06/13/2019 1411   BILIRUBINUR NEGATIVE 06/13/2019 1411   BILIRUBINUR neg 07/07/2017 1630   KETONESUR NEGATIVE 06/13/2019 1411   PROTEINUR NEGATIVE 06/13/2019 1411   UROBILINOGEN 0.2  07/07/2017 1630   UROBILINOGEN 0.2 03/28/2015 1236   NITRITE NEGATIVE 06/13/2019 1411   LEUKOCYTESUR LARGE (A) 06/13/2019 1411   Sepsis Labs: @LABRCNTIP (procalcitonin:4,lacticidven:4)  ) Recent Results (from the past 240 hour(s))  SARS Coronavirus 2 (CEPHEID- Performed in [place] hospital lab), Hosp Order     Status: None   Collection Time: 06/11/19 12:14 PM   Specimen: Nasopharyngeal Swab  Result Value Ref Range Status   SARS Coronavirus 2 NEGATIVE NEGATIVE Final    Comment: (NOTE) If result is NEGATIVE SARS-CoV-2 target nucleic acids are NOT DETECTED. The SARS-CoV-2 RNA is generally detectable in upper and lower  respiratory specimens during the acute phase of infection. The lowest  concentration of SARS-CoV-2 viral copies this assay can detect is 250  copies / mL. A negative result does not preclude SARS-CoV-2 infection  and should not be used as the sole basis for treatment or other  patient management decisions.  A negative result may occur with  improper specimen collection / handling, submission of specimen other  than nasopharyngeal swab, presence of viral mutation(s) within the  areas targeted by this assay, and inadequate number of viral copies  (<250 copies / mL). A negative result must be combined with clinical  observations, patient history, and epidemiological information. If result is POSITIVE SARS-CoV-2 target nucleic acids are DETECTED. The SARS-CoV-2 RNA is generally detectable in upper and lower  respiratory specimens dur ing the acute phase of infection.  Positive  results are indicative of active infection with SARS-CoV-2.  Clinical  correlation with patient history and other diagnostic information is  necessary to determine patient infection status.  Positive results do  not rule out bacterial infection or co-infection with other viruses. If result is PRESUMPTIVE POSTIVE SARS-CoV-2 nucleic acids MAY BE PRESENT.   A presumptive positive result was  obtained on the  submitted specimen  and confirmed on repeat testing.  While 2019 novel coronavirus  (SARS-CoV-2) nucleic acids may be present in the submitted sample  additional confirmatory testing may be necessary for epidemiological  and / or clinical management purposes  to differentiate between  SARS-CoV-2 and other Sarbecovirus currently known to infect humans.  If clinically indicated additional testing with an alternate test  methodology [phone]) is advised. The SARS-CoV-2 RNA is generally  detectable in upper and lower respiratory sp ecimens during the acute  phase of infection. The expected result is Negative. Fact Sheet for Patients:  StrictlyIdeas.no Fact Sheet for Healthcare Providers: BankingDealers.co.za This test is not yet approved or cleared by the [place] FDA and has been authorized for detection and/or diagnosis of SARS-CoV-2 by FDA under an Emergency Use Authorization (EUA).  This EUA will remain in effect (meaning this test can be used) for the duration of the COVID-19 declaration under Section 564(b)(1) of the Act, 21 U.S.C. section 360bbb-3(b)(1), unless the authorization is terminated or revoked sooner. Performed at [facility], [place] [address], [place], [place] [address]   MRSA PCR Screening     Status: None   Collection Time: 06/13/19  4:16 AM   Specimen: Nasal Mucosa; Nasopharyngeal  Result Value Ref Range Status   MRSA by PCR NEGATIVE NEGATIVE Final    Comment:        The GeneXpert MRSA Assay (FDA approved for NASAL specimens only), is one component of a comprehensive MRSA colonization surveillance program. It is not intended to diagnose MRSA infection nor to guide or monitor treatment for MRSA infections. Performed at [facility], [place] [address] [place], [place] [address]       Studies: Dg Chest Port 1 View  Result Date: 06/16/2019 CLINICAL DATA:   Shortness of breath with edema. EXAM: PORTABLE CHEST 1 VIEW COMPARISON:  06/12/2019 FINDINGS: Lungs are adequately inflated demonstrate improved hazy perihilar density with worsening patchy airspace opacification over the upper lobes right worse than left. Findings are concerning for multifocal pneumonia. No effusions. Stable cardiomegaly. Remainder of the exam is unchanged. IMPRESSION: Worsening patchy bilateral airspace process over the upper lobes right worse than left likely multifocal pneumonia. Stable cardiomegaly. Near resolution of previously noted hazy perihilar opacification. No effusions. Electronically Signed   By: Marin Olp M.D.   On: 06/16/2019 09:57    Scheduled Meds: . amLODipine  5 mg Oral Daily  . aspirin EC  325 mg Oral Daily  . atorvastatin  40 mg Oral Daily  . Chlorhexidine Gluconate Cloth  6 each Topical Daily  . dicyclomine  20 mg Oral TID AC  . enoxaparin (LOVENOX) injection  40 mg Subcutaneous Q24H  . fluticasone furoate-vilanterol  1 puff Inhalation Daily  . hydrALAZINE  25 mg Oral Q8H  . ipratropium  0.5 mg Nebulization BID  . isosorbide dinitrate  10 mg Oral TID  . levalbuterol  0.63 mg Nebulization BID  . loratadine  10 mg Oral QHS  . nebivolol  20 mg Oral Daily  . neomycin-polymyxin b-dexamethasone  1 drop Left Eye BID  . pantoprazole  40 mg Oral BID  . potassium chloride  40 mEq Oral BID  . predniSONE  20 mg Oral Q breakfast  . sodium chloride flush  10-40 mL Intracatheter Q12H  . sodium chloride flush  3 mL Intravenous Q12H  . sodium chloride flush  3 mL Intravenous Q12H    Continuous Infusions: . sodium chloride  LOS: 4 days     Kayleen Memos, MD [facility] Pager [phone]  If 7PM-7AM, please contact night-coverage [email or web address] Password [facility] 06/16/2019, 2:43 PM

## 2019-06-16 NOTE — Progress Notes (Signed)
Progress Note  Patient Name: Tracey Manning Date of Encounter: 06/16/2019  Primary Cardiologist: Quay Burow, MD   Subjective   No chest pain breathing worse post cath Getting Rx now   Inpatient Medications    Scheduled Meds: . amLODipine  5 mg Oral Daily  . aspirin EC  325 mg Oral Daily  . atorvastatin  40 mg Oral Daily  . Chlorhexidine Gluconate Cloth  6 each Topical Daily  . dicyclomine  20 mg Oral TID AC  . enoxaparin (LOVENOX) injection  40 mg Subcutaneous Q24H  . fluticasone furoate-vilanterol  1 puff Inhalation Daily  . furosemide  40 mg Intravenous Once  . hydrALAZINE  25 mg Oral Q8H  . ipratropium  0.5 mg Nebulization TID  . isosorbide dinitrate  10 mg Oral TID  . levalbuterol  0.63 mg Nebulization TID  . loratadine  10 mg Oral QHS  . nebivolol  20 mg Oral Daily  . neomycin-polymyxin b-dexamethasone  1 drop Left Eye BID  . pantoprazole  40 mg Oral BID  . potassium chloride  40 mEq Oral BID  . predniSONE  20 mg Oral Q breakfast  . sodium chloride flush  10-40 mL Intracatheter Q12H  . sodium chloride flush  3 mL Intravenous Q12H  . sodium chloride flush  3 mL Intravenous Q12H   Continuous Infusions: . sodium chloride     PRN Meds: sodium chloride, acetaminophen, albuterol, ALPRAZolam, hydrALAZINE, labetalol, LORazepam, morphine injection, nitroGLYCERIN, ondansetron (ZOFRAN) IV, oxyCODONE-acetaminophen **AND** oxyCODONE, sodium chloride flush, sodium chloride flush, zolpidem   Vital Signs    Vitals:   06/15/19 2038 06/16/19 0500 06/16/19 0814 06/16/19 0825  BP:  121/89 (!) 150/77 (!) 150/77  Pulse:  80  87  Resp: 20 18  18   Temp: 98 F (36.7 C) 97.9 F (36.6 C)    TempSrc: Oral Oral    SpO2: 100% 98%  97%  Weight:  91 kg    Height:        Intake/Output Summary (Last 24 hours) at 06/16/2019 0829 Last data filed at 06/16/2019 0500 Gross per 24 hour  Intake 1192.53 ml  Output 3700 ml  Net -2507.47 ml   Last 3 Weights 06/16/2019 06/15/2019  06/14/2019  Weight (lbs) 200 lb 9.6 oz 206 lb 5.6 oz 212 lb 8.4 oz  Weight (kg) 90.992 kg 93.6 kg 96.4 kg      Telemetry    SR, PVCs - Personally Reviewed  ECG    No new - Personally Reviewed  Physical Exam   General: Well developed, well nourished, female in no acute distress Head: Eyes PERRLA, No xanthomas.   Normocephalic and atraumatic Lungs: rhonchi and basilar rales  Heart: HRRR S1 S2, without MRG.  Pulses are 2+ & equal. No JVD. Abdomen: Bowel sounds are present, abdomen soft and non-tender without masses or  hernias noted. Msk: Normal strength and tone for age. Extremities: No clubbing, cyanosis or edema.    Skin:  No rashes or lesions noted. Neuro: Alert and oriented X 3. Psych:  Good affect, responds appropriately  Labs    High Sensitivity Troponin:   Recent Labs  Lab 06/11/19 1214 06/11/19 1511 06/12/19 0547  TROPONINIHS 43* 33.0* 29.0*   Lab Results  Component Value Date   CKTOTAL 100 06/11/2019   CKMB 4.9 06/11/2019   TROPONINI <0.03 11/04/2017   Chemistry Recent Labs  Lab 06/11/19 1214  06/13/19 0816 06/14/19 0216 06/15/19 0501  06/15/19 1032 06/15/19 1046 06/15/19 1630 06/16/19 0455  NA 140   < >  141 138 137   < > 141 139  --  138  K 3.4*   < > 4.4 4.5 4.8   < > 4.2 4.2  --  4.1  CL 104   < > 103 99 102  --   --   --   --  101  CO2 23   < > 25 26 28   --   --   --   --  27  GLUCOSE 120*   < > 133* 102* 98  --   --   --   --  127*  BUN 16   < > 25* 41* 30*  --   --   --   --  17  CREATININE 1.05*   < > 1.37* 1.81* 1.45*  --   --   --  1.39* 1.27*  CALCIUM 9.6   < > 9.2 8.7* 8.8*  --   --   --   --  9.0  PROT 8.3*  --  7.1 6.8  --   --   --   --   --   --   ALBUMIN 4.4  --  3.8 3.7  --   --   --   --   --   --   AST 20  --  30 25  --   --   --   --   --   --   ALT 22  --  51* 46*  --   --   --   --   --   --   ALKPHOS 79  --  63 60  --   --   --   --   --   --   BILITOT 0.7  --  0.6 0.6  --   --   --   --   --   --   GFRNONAA 59*   < > 43*  30* 40*  --   --   --  42* 47*  GFRAA >60   < > 49* 35* 46*  --   --   --  49* 54*  ANIONGAP 13   < > 13 13 7   --   --   --   --  10   < > = values in this interval not displayed.     Hematology Recent Labs  Lab 06/15/19 0501  06/15/19 1046 06/15/19 1630 06/16/19 0455  WBC 11.1*  --   --  9.1 12.4*  RBC 4.45  --   --  4.56 4.19  HGB 12.5   < > 11.9* 13.1 12.0  HCT 39.4   < > 35.0* 39.3 36.0  MCV 88.5  --   --  86.2 85.9  MCH 28.1  --   --  28.7 28.6  MCHC 31.7  --   --  33.3 33.3  RDW 17.1*  --   --  16.8* 16.3*  PLT 195  --   --  203 177   < > = values in this interval not displayed.    BNP Recent Labs  Lab 06/12/19 1411  BNP 600.7*    DDimer  Recent Labs  Lab 06/12/19 1411  DDIMER 0.72*    Lab Results  Component Value Date   CHOL 220 (H) 06/14/2019   HDL 57 06/14/2019   LDLCALC 131 (H) 06/14/2019   TRIG 161 (H) 06/14/2019   CHOLHDL 3.9 06/14/2019     Radiology  US Renal  Result Date: 06/15/2019 CLINICAL DATA:  Acute kidney injury EXAM: RENAL / URINARY TRACT ULTRASOUND COMPLETE COMPARISON:  CT 04/13/2018 FINDINGS: Right Kidney: Renal measurements: 10.9 x 4.4 x 4.7 cm = volume: 117 mL. Three cysts within the right kidney, the largest 3.2 cm in the midpole. These are stable since prior CT. No hydronephrosis. Normal echotexture. Left Kidney: Renal measurements: 10.0 x 5.2 x 4.2 cm = volume: 113 mL. Echogenicity within normal limits. No mass or hydronephrosis visualized. Bladder: Decompressed, not visualized. IMPRESSION: No acute findings.  No hydronephrosis. Electronically Signed   By: Rolm Baptise M.D.   On: 06/15/2019 09:28    Cardiac Studies   Echo 06/12/19 IMPRESSIONS    1. The left ventricle has severely reduced systolic function, with an ejection fraction of 25-30%. The cavity size was moderately dilated. Left ventricular diastolic function could not be evaluated. Left ventricular diffuse hypokinesis.  2. The right ventricle has normal systolic  function. The cavity was normal.  3. The mitral valve is grossly normal.  4. The aortic root is normal in size and structure.  5. The tricuspid valve is grossly normal.  6. The aortic valve is tricuspid. Mild thickening of the aortic valve. No stenosis of the aortic valve.  7. Severe global reduction in LV systolic function; moderate LVE; mild MR.  FINDINGS  Left Ventricle: The left ventricle has severely reduced systolic function, with an ejection fraction of 25-30%. The cavity size was moderately dilated. There is no increase in left ventricular wall thickness. Left ventricular diastolic function could  not be evaluated. Left ventricular diffuse hypokinesis.  Right Ventricle: The right ventricle has normal systolic function. The cavity was normal.  Left Atrium: Left atrial size was normal in size.  Right Atrium: Right atrial size was normal in size. Right atrial pressure is estimated at 10 mmHg.  Interatrial Septum: No atrial level shunt detected by color flow Doppler.  Pericardium: There is no evidence of pericardial effusion.  Mitral Valve: The mitral valve is grossly normal. Mitral valve regurgitation is mild by color flow Doppler.  Tricuspid Valve: The tricuspid valve is grossly normal. Tricuspid valve regurgitation was not visualized by color flow Doppler.  Aortic Valve: The aortic valve is tricuspid Mild thickening of the aortic valve. Aortic valve regurgitation was not visualized by color flow Doppler. There is No stenosis of the aortic valve.  Pulmonic Valve: The pulmonic valve was not well visualized. Pulmonic valve regurgitation is not visualized by color flow Doppler.  Aorta: The aortic root is normal in size and structure.  Venous: The inferior vena cava is normal in size with greater than 50% respiratory variability.  Additional Comments: Severe global reduction in LV systolic function; moderate LVE; mild MR.    Patient Profile     58 y.o. female  with a hx of MI and CAD and multiple stents, tobacco use,  HTN and HLD now with admit for SOB/Asthma and new cardiomyopathy. Cath on 7/24 showed occluded RCA with collaterals tight OM and stenosis proximal to stent in mid LAD.  Had moderate pulmonary HTN and PCWP 37 mmHg   Assessment & Plan    Acute SOB combination of Asthma and Acute Systolic CHF  - COPD exacerbation and CHF  Continue bid lasix to diurese   Cardiomyopathy new-  - EF 60-65%>>25-30% by echo this admit - continue Bystolic, Isordil, hydralazine - lisinopril d/c'd due to renal function  - once renal function stable, consider Entresto Cr better 1.27 today  - SBP  100s-130s on current rx -  F/U CXR today   HTN uncontrolled  - BP very elevated on admit>>improved - amlodipine not best rx w/ low EF, will decrease to 5 mg qd and increase isordil, see how tolerated - peak BP 202/149, much improved - consider ARB/Entresto once renal function stable  CAD  - I don't think she is a good CABG candidate with severe COPD, moderate pulmonary HTN Active smoking and poor functional status. When CHF is improved would consider PCI/Stent Of OM and mid LAD    HLD - on Lipitor 40 - extremely poor diet pta - Life Coach referral made - continue Lipitor  Tobacco use  - understands she needs to quit  Elevated ddimer - no PE on CT, no DVT  CKD 3  - Improved 1.27 today continue diuresis      For questions or updates, please contact [facility] Please consult [email or web address] for contact info under      Signed, Jenkins Rouge, MD  06/16/2019, 8:29 AM

## 2019-06-16 NOTE — Progress Notes (Signed)
Pt  has given her permission for her grandson Delantoe  to be updated with her medical condition as well as her plans medically. Pt request for  Attending MD to discuss with her. Pt wishes to discuss Medical POA.

## 2019-06-17 DIAGNOSIS — R0609 Other forms of dyspnea: Secondary | ICD-10-CM

## 2019-06-17 LAB — BASIC METABOLIC PANEL
Anion gap: 9 (ref 5–15)
BUN: 21 mg/dL — ABNORMAL HIGH (ref 6–20)
CO2: 26 mmol/L (ref 22–32)
Calcium: 9.1 mg/dL (ref 8.9–10.3)
Chloride: 102 mmol/L (ref 98–111)
Creatinine, Ser: 1.39 mg/dL — ABNORMAL HIGH (ref 0.44–1.00)
GFR calc Af Amer: 49 mL/min — ABNORMAL LOW (ref >60)
GFR calc non Af Amer: 42 mL/min — ABNORMAL LOW (ref >60)
Glucose, Bld: 108 mg/dL — ABNORMAL HIGH (ref 70–99)
Potassium: 4.2 mmol/L (ref 3.5–5.1)
Sodium: 137 mmol/L (ref 135–145)

## 2019-06-17 LAB — CBC
HCT: 35.9 % — ABNORMAL LOW (ref 36.0–46.0)
Hemoglobin: 11.7 g/dL — ABNORMAL LOW (ref 12.0–15.0)
MCH: 28.6 pg (ref 26.0–34.0)
MCHC: 32.6 g/dL (ref 30.0–36.0)
MCV: 87.8 fL (ref 80.0–100.0)
Platelets: 182 10*3/uL (ref 150–400)
RBC: 4.09 MIL/uL (ref 3.87–5.11)
RDW: 16.4 % — ABNORMAL HIGH (ref 11.5–15.5)
WBC: 11.9 10*3/uL — ABNORMAL HIGH (ref 4.0–10.5)
nRBC: 0 % (ref 0.0–0.2)

## 2019-06-17 LAB — LACTIC ACID, PLASMA: Lactic Acid, Venous: 0.7 mmol/L (ref 0.5–1.9)

## 2019-06-17 LAB — PROCALCITONIN: Procalcitonin: 0.38 ng/mL

## 2019-06-17 LAB — TROPONIN I (HIGH SENSITIVITY): Troponin I (High Sensitivity): 76 ng/L — ABNORMAL HIGH (ref <18)

## 2019-06-17 MED ORDER — POTASSIUM CHLORIDE CRYS ER 20 MEQ PO TBCR
40.0000 meq | EXTENDED_RELEASE_TABLET | Freq: Every day | ORAL | Status: DC
  Administered 2019-06-17 – 2019-06-20 (×4): 40 meq via ORAL
  Filled 2019-06-17 (×3): qty 2

## 2019-06-17 MED ORDER — NITROGLYCERIN IN D5W 200-5 MCG/ML-% IV SOLN
0.0000 ug/min | INTRAVENOUS | Status: DC
  Administered 2019-06-17: 10 ug/min via INTRAVENOUS
  Administered 2019-06-19: 5 ug/min via INTRAVENOUS
  Administered 2019-06-19: 20 ug/min via INTRAVENOUS
  Filled 2019-06-17: qty 250

## 2019-06-17 MED ORDER — FUROSEMIDE 40 MG PO TABS
40.0000 mg | ORAL_TABLET | Freq: Every day | ORAL | Status: DC
  Administered 2019-06-17 – 2019-06-20 (×4): 40 mg via ORAL
  Filled 2019-06-17 (×4): qty 1

## 2019-06-17 MED ORDER — NITROGLYCERIN IN D5W 200-5 MCG/ML-% IV SOLN
INTRAVENOUS | Status: AC
  Administered 2019-06-17: 10 ug/min via INTRAVENOUS
  Filled 2019-06-17: qty 250

## 2019-06-17 NOTE — Progress Notes (Addendum)
Progress Note  Patient Name: Tracey Manning Date of Encounter: 06/17/2019  Primary Cardiologist: Quay Burow, MD   Subjective   Denies any SOB, Chest pain this morning.   Inpatient Medications    Scheduled Meds: . amLODipine  5 mg Oral Daily  . aspirin EC  325 mg Oral Daily  . atorvastatin  40 mg Oral Daily  . Chlorhexidine Gluconate Cloth  6 each Topical Daily  . dicyclomine  20 mg Oral TID AC  . enoxaparin (LOVENOX) injection  40 mg Subcutaneous Q24H  . fluticasone furoate-vilanterol  1 puff Inhalation Daily  . hydrALAZINE  25 mg Oral Q8H  . ipratropium  0.5 mg Nebulization BID  . isosorbide dinitrate  10 mg Oral TID  . levalbuterol  0.63 mg Nebulization BID  . loratadine  10 mg Oral QHS  . nebivolol  20 mg Oral Daily  . neomycin-polymyxin b-dexamethasone  1 drop Left Eye BID  . pantoprazole  40 mg Oral BID  . potassium chloride  40 mEq Oral BID  . predniSONE  20 mg Oral Q breakfast  . sodium chloride flush  10-40 mL Intracatheter Q12H  . sodium chloride flush  3 mL Intravenous Q12H  . sodium chloride flush  3 mL Intravenous Q12H   Continuous Infusions: . sodium chloride     PRN Meds: sodium chloride, acetaminophen, albuterol, ALPRAZolam, hydrALAZINE, labetalol, LORazepam, morphine injection, nitroGLYCERIN, ondansetron (ZOFRAN) IV, oxyCODONE-acetaminophen **AND** oxyCODONE, sodium chloride flush, sodium chloride flush, zolpidem   Vital Signs    Vitals:   06/16/19 2034 06/16/19 2222 06/17/19 0431 06/17/19 0434  BP: 126/73 136/73  119/76  Pulse: 64 64  66  Resp: 18   18  Temp: 98.1 F (36.7 C)   97.6 F (36.4 C)  TempSrc: Oral   Oral  SpO2: 98%   96%  Weight:   90.4 kg   Height:        Intake/Output Summary (Last 24 hours) at 06/17/2019 0740 Last data filed at 06/17/2019 0443 Gross per 24 hour  Intake 490 ml  Output 800 ml  Net -310 ml   Last 3 Weights 06/17/2019 06/16/2019 06/15/2019  Weight (lbs) 199 lb 6.4 oz 200 lb 9.6 oz 206 lb 5.6 oz  Weight  (kg) 90.447 kg 90.992 kg 93.6 kg      Telemetry    NSR without significant ventricular ectopy - Personally Reviewed  ECG    NSR without significant ST-T wave changes - Personally Reviewed  Physical Exam   GEN: No acute distress.   Neck: No JVD Cardiac: RRR, no murmurs, rubs, or gallops.  Respiratory: Clear to auscultation bilaterally. GI: Soft, nontender, non-distended  MS: No edema; No deformity. Neuro:  Nonfocal  Psych: Normal affect   Labs    High Sensitivity Troponin:   Recent Labs  Lab 06/11/19 1214 06/11/19 1511 06/12/19 0547  TROPONINIHS 43* 33.0* 29.0*      Cardiac EnzymesNo results for input(s): TROPONINI in the last 168 hours. No results for input(s): TROPIPOC in the last 168 hours.   Chemistry Recent Labs  Lab 06/11/19 1214  06/13/19 0816 06/14/19 0216 06/15/19 0501  06/15/19 1046 06/15/19 1630 06/16/19 0455 06/17/19 0421  NA 140   < > 141 138 137   < > 139  --  138 137  K 3.4*   < > 4.4 4.5 4.8   < > 4.2  --  4.1 4.2  CL 104   < > 103 99 102  --   --   --  101 102  CO2 23   < > 25 26 28   --   --   --  27 26  GLUCOSE 120*   < > 133* 102* 98  --   --   --  127* 108*  BUN 16   < > 25* 41* 30*  --   --   --  17 21*  CREATININE 1.05*   < > 1.37* 1.81* 1.45*  --   --  1.39* 1.27* 1.39*  CALCIUM 9.6   < > 9.2 8.7* 8.8*  --   --   --  9.0 9.1  PROT 8.3*  --  7.1 6.8  --   --   --   --   --   --   ALBUMIN 4.4  --  3.8 3.7  --   --   --   --   --   --   AST 20  --  30 25  --   --   --   --   --   --   ALT 22  --  51* 46*  --   --   --   --   --   --   ALKPHOS 79  --  63 60  --   --   --   --   --   --   BILITOT 0.7  --  0.6 0.6  --   --   --   --   --   --   GFRNONAA 59*   < > 43* 30* 40*  --   --  42* 47* 42*  GFRAA >60   < > 49* 35* 46*  --   --  49* 54* 49*  ANIONGAP 13   < > 13 13 7   --   --   --  10 9   < > = values in this interval not displayed.     Hematology Recent Labs  Lab 06/15/19 1630 06/16/19 0455 06/17/19 0421  WBC 9.1 12.4*  11.9*  RBC 4.56 4.19 4.09  HGB 13.1 12.0 11.7*  HCT 39.3 36.0 35.9*  MCV 86.2 85.9 87.8  MCH 28.7 28.6 28.6  MCHC 33.3 33.3 32.6  RDW 16.8* 16.3* 16.4*  PLT 203 177 182    BNP Recent Labs  Lab 06/12/19 1411  BNP 600.7*     DDimer  Recent Labs  Lab 06/12/19 1411  DDIMER 0.72*     Radiology    Dg Chest Port 1 View  Result Date: 06/16/2019 CLINICAL DATA:  Shortness of breath with edema. EXAM: PORTABLE CHEST 1 VIEW COMPARISON:  06/12/2019 FINDINGS: Lungs are adequately inflated demonstrate improved hazy perihilar density with worsening patchy airspace opacification over the upper lobes right worse than left. Findings are concerning for multifocal pneumonia. No effusions. Stable cardiomegaly. Remainder of the exam is unchanged. IMPRESSION: Worsening patchy bilateral airspace process over the upper lobes right worse than left likely multifocal pneumonia. Stable cardiomegaly. Near resolution of previously noted hazy perihilar opacification. No effusions. Electronically Signed   By: Marin Olp M.D.   On: 06/16/2019 09:57    Cardiac Studies   Cath 06/15/2019  Acute on chronic combined systolic and diastolic heart failure, decompensated with severe elevation in LVEDP and mean pulmonary capillary wedge, both greater than 30 mmHg.  EF by echo is 25%.  Severe coronary artery disease with angulated high-grade stenosis in the proximal LAD that includes in-stent restenosis.  Widely patent left main  Moderate  circumflex disease with 60 to 70% stenosis in the mid vessel after the first obtuse marginal followed by 85% stenosis in a large branch, 40% segmental stenosis in the proximal first obtuse marginal, focal 50% in-stent restenosis within overlapping stents in the third obtuse marginal.  50% segmental narrowing proximal to the third obtuse marginal stents.  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  RCA appeared to be codominant.   Left to right collaterals are noted.  RECOMMENDATIONS:   Heart failure therapy.  40 mg of IV furosemide was given postprocedure  Watch kidney function closely as she is at risk for acute kidney injury.  Revascularization should be considered.  Surgical versus percutaneous will be the debate.  This cannot be done until heart failure is much better compensated.  Also of question is whether anterior wall is viable.  Patient Profile     58 y.o. female with PMH of CAD, tobacco abuse, HTN, and HLD presented with SOB/asthma and found to have new cardiomyopathy. Cath 7/24 showed occluded RCA with severe stenosis of OM and prox LAD. COVID 19 negative.   Assessment & Plan    1. Dyspnea: due to combination of asthma and acute systolic CHF  2. Acute systolic heart failure  - new cardiomyopathy with EF 25-30% by Echo 7/20. Previous echo 04/17/2014 EF 60-65%.   - appears to be near euvolemic level, start on 40mg  daily PO lasix. Reduce potassium chloride to 54meq daily instead of BID.  3. CAD: cath 06/15/2019 showed 60-70% stenosis in mid LCx, 80% OM, 95% prox LAD, occluded prox to mid RCA. EF 25%  - not a candidate for CABG per Dr. Johnsie Cancel, plan for staged PCI of LAD and OM   - chest pain this morning, nurse to obtain EKG and give nitro  - consider set up for cath and PCI on Monday  4. Uncontrolled HTN: on amlodipine 5mg  daily. Isordil and hydralazine. Bystolic.    BP well controlled.  - consider ARB instead of CCB when renal fx stable 5. HLD:   6. Elevated d-dimer: no PE on CT  7. Tobacco abuse        For questions or updates, please contact [place] Please consult [email or web address] for contact info under        Signed, Almyra Deforest, [place]  06/17/2019, 7:40 AM    ---------------------------------------------------------------------------------------------   History and all data above reviewed.  Patient examined.  I agree with the findings as above.  [name] had CP this am,  ECG unchanged from prior. She c/o 8/10 chest pain, but appears comfortable in the room in good spirits.   Constitutional: No acute distress ENMT: normal dentition, moist mucous membranes Cardiovascular: regular rhythm, normal rate, no murmurs. S1 and S2 normal. Radial pulses normal bilaterally. No jugular venous distention.  Respiratory: clear to auscultation bilaterally GI : normal bowel sounds, soft and nontender. No distention.   MSK: extremities warm, well perfused. No edema.  NEURO: grossly nonfocal exam, moves all extremities. PSYCH: alert and oriented x 3, normal mood and affect.   All available labs, radiology testing, previous records reviewed. Agree with documented assessment and plan of my colleague as stated above with the following additions or changes:  Principal Problem:   Dyspnea on exertion Active Problems:   Essential hypertension   Hyperlipidemia   Coronary artery disease   Chest pain   Acute on chronic diastolic HF (heart failure) (HCC)   AKI (acute kidney injury) ([place])  Plan: will initiate nitro drip for chest pain and plan for PCI on Monday given continued chest pain and poor candidate for CABG due to functional status and pulmonary disease.   Length of Stay:  LOS: 5 days   Elouise Munroe, MD [facility] 8:39 AM  06/17/2019

## 2019-06-17 NOTE — Progress Notes (Signed)
PROGRESS NOTE  Tracey Manning OVF:[id number] DOB: 09/05/1961 DOA: 06/11/2019 PCP: Nolene Ebbs, MD  HPI/Recap of past 24 hours: Tracey Hendersonis a 58 y.o.femalewith medical history significant ofCAD with multiple stents, HTN, HLD, COPD, anxiety presenting with shortness of breath and nonspecific chest pain.  She was admitted for shortness of breath and nonspecific chest pain.  Developed worsening SOB and orthopnea on hospital day 1 requiring transfer to stepdown.  Found to have HF exacerbation with low EF and imaging with edema.  Cardiology was consulted -planning L and R heart cath.  Transferred to [facility] on 06/15/2019 where she had left and right heart cath. Cath on 7/24 showed occluded RCA with collaterals tight OM and stenosis proximal to stent in mid LAD.  Had moderate pulmonary HTN and PCWP 37 mmHg.  06/17/19: Patient was seen and examined at her bedside.  She reports nonproductive cough and chest discomfort.  Started on nitro drip per cardiology.  Possible PCI on Monday.  Reviewed chest x-ray done on 06/16/2019, showing markings in the upper lobes. Obtained lactic acid and procalcitonin this morning and all negative, less likely bacterial related.   Assessment/Plan: Principal Problem:   Dyspnea on exertion Active Problems:   Essential hypertension   Hyperlipidemia   Coronary artery disease   Chest pain   Acute on chronic diastolic HF (heart failure) (HCC)   AKI (acute kidney injury) ([place])  Newly diagnosed cardiomyopathy/acute systolic CHF Post left and right heart cath which showed occluded RCA with collaterals tight OM and stenosis proximal to stent in mid LAD.  Had moderate pulmonary HTN and PCWP 37 mmHg Per cardiology not a good candidate for CABG due to COPD, ongoing tobacco use, history of noncompliance, and poor functional status. Last 2D echo done during this admission revealed LVEF 25 to 84% Continue Bystolic, Isordil, hydralazine, once renal function stable consider  Entresto per cardiology Continue strict I's and O's and daily weight Chest discomfort, started on nitro drip Possible PCI Monday  Pulmonary edema likely cardiogenic Independently viewed chest x-ray done on admission which showed cardiomegaly with increase in pulmonary vascularity Repeated chest x-ray done on 06/16/2019, reviewed personally shows increased markings in upper lobes greater on the right Lactic acid and procalcitonin negative Maintain O2 saturation greater than 92% Defer diuretics to cardiology  Chest discomfort Post heart cath on 06/16/2019 with findings as stated above Nitro drip per cardiology  Coronary artery disease Continue Lipitor and aspirin Not a good candidate for CABG per cardiology due to uncontrolled COPD, ongoing tobacco use with poor functional status When CHF is improved cardiology considering PCI/stent of OM and mid LAD possibly Monday, 06/19/2019  Acute on chronic hypoxic respiratory failure likely secondary to acute systolic CHF with pulmonary edema in the setting of COPD and ongoing tobacco use Management as stated above  Uncontrolled hypertension, resolved Continue cardiac medications as stated above  COPD on 2 L nasal cannula continuously at baseline started 2 years ago, noncompliant with oxygen therapy at home Maintain O2 saturation greater than 92% Continue bronchodilators  CKD 3 Creatinine 1.2 with GFR 54>> cr.1.39 Continue to avoid nephrotoxins Monitor urine output Repeat BMP in the morning  Leukocytosis, likely reactive post heart cath WBC 12 K >> 11.9K Afebrile No sign of active infective process Procalcitonin and lactic acid negative on 06/17/2019  Hyperlipidemia Continue Lipitor  DVT prophylaxis: lovenox. Code Status: full  Family Communication discussed with the patient in detail  Disposition Plan:  Possible discharge to home in 2 to 3 days when cardiology signs  off.  Consultants:   Cardiology  Procedures:   Echo IMPRESSIONS   1. The left ventricle has severely reduced systolic function, with an ejection fraction of 25-30%. The cavity size was moderately dilated. Left ventricular diastolic function could not be evaluated. Left ventricular diffuse hypokinesis. 2. The right ventricle has normal systolic function. The cavity was normal. 3. The mitral valve is grossly normal. 4. The aortic root is normal in size and structure. 5. The tricuspid valve is grossly normal. 6. The aortic valve is tricuspid. Mild thickening of the aortic valve. No stenosis of the aortic valve. 7. Severe global reduction in LV systolic function; moderate LVE; mild MR.  LE [place] Summary: Right: There is no evidence of deep vein thrombosis in the lower extremity. No cystic structure found in the popliteal fossa. Left: There is no evidence of deep vein thrombosis in the lower extremity. No cystic structure found in the popliteal fossa.     Objective: Vitals:   06/17/19 0431 06/17/19 0434 06/17/19 0806 06/17/19 0829  BP:  119/76  (!) 128/111  Pulse:  66    Resp:  18    Temp:  97.6 F (36.4 C)    TempSrc:  Oral    SpO2:  96% 98%   Weight: 90.4 kg     Height:        Intake/Output Summary (Last 24 hours) at 06/17/2019 1207 Last data filed at 06/17/2019 0443 Gross per 24 hour  Intake 490 ml  Output 800 ml  Net -310 ml   Filed Weights   06/15/19 0500 06/16/19 0500 06/17/19 0431  Weight: 93.6 kg 91 kg 90.4 kg    Exam:   General: 58 y.o. year-old female developed well-nourished.  NAD.  Alert and oriented x3.    Cardiovascular: Regular rate and rhythm no rubs or gallops.  No JVD or thyromegaly noted.    Respiratory: Mild rales bilaterally no wheezes noted.  Poor inspiratory effort.  Abdomen: Soft nontender nondistended.  Normal bowel sounds present.  Musculoskeletal: No lower extremity edema.  2 out of 4 pulses in all 4 extremities.   Psychiatry: Mood is appropriate for condition and  setting.   Data Reviewed: CBC: Recent Labs  Lab 06/11/19 1214  06/14/19 0216 06/15/19 0501  06/15/19 1032 06/15/19 1046 06/15/19 1630 06/16/19 0455 06/17/19 0421  WBC 8.1   < > 15.2* 11.1*  --   --   --  9.1 12.4* 11.9*  NEUTROABS 7.2  --   --   --   --   --   --   --   --   --   HGB 14.2   < > 12.1 12.5   < > 12.2 11.9* 13.1 12.0 11.7*  HCT 44.3   < > 38.8 39.4   < > 36.0 35.0* 39.3 36.0 35.9*  MCV 85.9   < > 89.8 88.5  --   --   --  86.2 85.9 87.8  PLT 232   < > 213 195  --   --   --  203 177 182   < > = values in this interval not displayed.   Basic Metabolic Panel: Recent Labs  Lab 06/13/19 0816 06/14/19 0216 06/15/19 0501 06/15/19 1031 06/15/19 1032 06/15/19 1046 06/15/19 1630 06/16/19 0455 06/17/19 0421  NA 141 138 137 141 141 139  --  138 137  K 4.4 4.5 4.8 4.2 4.2 4.2  --  4.1 4.2  CL 103 99 102  --   --   --   --  101 102  CO2 25 26 28   --   --   --   --  27 26  GLUCOSE 133* 102* 98  --   --   --   --  127* 108*  BUN 25* 41* 30*  --   --   --   --  17 21*  CREATININE 1.37* 1.81* 1.45*  --   --   --  1.39* 1.27* 1.39*  CALCIUM 9.2 8.7* 8.8*  --   --   --   --  9.0 9.1  MG  --  2.2  --   --   --   --   --   --   --    GFR: Estimated Creatinine Clearance: 45.7 mL/min (A) (by C-G formula based on SCr of 1.39 mg/dL (H)). Liver Function Tests: Recent Labs  Lab 06/11/19 1214 06/13/19 0816 06/14/19 0216  AST 20 30 25   ALT 22 51* 46*  ALKPHOS 79 63 60  BILITOT 0.7 0.6 0.6  PROT 8.3* 7.1 6.8  ALBUMIN 4.4 3.8 3.7   No results for input(s): LIPASE, AMYLASE in the last 168 hours. No results for input(s): AMMONIA in the last 168 hours. Coagulation Profile: No results for input(s): INR, PROTIME in the last 168 hours. Cardiac Enzymes: Recent Labs  Lab 06/11/19 1511 06/11/19 1819 06/11/19 2001  CKTOTAL 98 100 100  CKMB 5.4* 5.1* 4.9   BNP (last 3 results) No results for input(s): PROBNP in the last 8760 hours. HbA1C: No results for input(s): HGBA1C  in the last 72 hours. CBG: No results for input(s): GLUCAP in the last 168 hours. Lipid Profile: No results for input(s): CHOL, HDL, LDLCALC, TRIG, CHOLHDL, LDLDIRECT in the last 72 hours. Thyroid Function Tests: No results for input(s): TSH, T4TOTAL, FREET4, T3FREE, THYROIDAB in the last 72 hours. Anemia Panel: No results for input(s): VITAMINB12, FOLATE, FERRITIN, TIBC, IRON, RETICCTPCT in the last 72 hours. Urine analysis:    Component Value Date/Time   COLORURINE YELLOW 06/13/2019 1411   APPEARANCEUR CLOUDY (A) 06/13/2019 1411   LABSPEC 1.021 06/13/2019 1411   PHURINE 5.0 06/13/2019 1411   GLUCOSEU NEGATIVE 06/13/2019 1411   HGBUR NEGATIVE 06/13/2019 1411   BILIRUBINUR NEGATIVE 06/13/2019 1411   BILIRUBINUR neg 07/07/2017 1630   KETONESUR NEGATIVE 06/13/2019 1411   PROTEINUR NEGATIVE 06/13/2019 1411   UROBILINOGEN 0.2 07/07/2017 1630   UROBILINOGEN 0.2 03/28/2015 1236   NITRITE NEGATIVE 06/13/2019 1411   LEUKOCYTESUR LARGE (A) 06/13/2019 1411   Sepsis Labs: @LABRCNTIP (procalcitonin:4,lacticidven:4)  ) Recent Results (from the past 240 hour(s))  SARS Coronavirus 2 (CEPHEID- Performed in [place] hospital lab), Hosp Order     Status: None   Collection Time: 06/11/19 12:14 PM   Specimen: Nasopharyngeal Swab  Result Value Ref Range Status   SARS Coronavirus 2 NEGATIVE NEGATIVE Final    Comment: (NOTE) If result is NEGATIVE SARS-CoV-2 target nucleic acids are NOT DETECTED. The SARS-CoV-2 RNA is generally detectable in upper and lower  respiratory specimens during the acute phase of infection. The lowest  concentration of SARS-CoV-2 viral copies this assay can detect is 250  copies / mL. A negative result does not preclude SARS-CoV-2 infection  and should not be used as the sole basis for treatment or other  patient management decisions.  A negative result may occur with  improper specimen collection / handling, submission of specimen other  than nasopharyngeal swab,  presence of viral mutation(s) within the  areas targeted by this assay, and inadequate  number of viral copies  (<250 copies / mL). A negative result must be combined with clinical  observations, patient history, and epidemiological information. If result is POSITIVE SARS-CoV-2 target nucleic acids are DETECTED. The SARS-CoV-2 RNA is generally detectable in upper and lower  respiratory specimens dur ing the acute phase of infection.  Positive  results are indicative of active infection with SARS-CoV-2.  Clinical  correlation with patient history and other diagnostic information is  necessary to determine patient infection status.  Positive results do  not rule out bacterial infection or co-infection with other viruses. If result is PRESUMPTIVE POSTIVE SARS-CoV-2 nucleic acids MAY BE PRESENT.   A presumptive positive result was obtained on the submitted specimen  and confirmed on repeat testing.  While 2019 novel coronavirus  (SARS-CoV-2) nucleic acids may be present in the submitted sample  additional confirmatory testing may be necessary for epidemiological  and / or clinical management purposes  to differentiate between  SARS-CoV-2 and other Sarbecovirus currently known to infect humans.  If clinically indicated additional testing with an alternate test  methodology [phone]) is advised. The SARS-CoV-2 RNA is generally  detectable in upper and lower respiratory sp ecimens during the acute  phase of infection. The expected result is Negative. Fact Sheet for Patients:  StrictlyIdeas.no Fact Sheet for Healthcare Providers: BankingDealers.co.za This test is not yet approved or cleared by the [place] FDA and has been authorized for detection and/or diagnosis of SARS-CoV-2 by FDA under an Emergency Use Authorization (EUA).  This EUA will remain in effect (meaning this test can be used) for the duration of the COVID-19 declaration under  Section 564(b)(1) of the Act, 21 U.S.C. section 360bbb-3(b)(1), unless the authorization is terminated or revoked sooner. Performed at [facility], [place] [address], [place], [place] [address]   MRSA PCR Screening     Status: None   Collection Time: 06/13/19  4:16 AM   Specimen: Nasal Mucosa; Nasopharyngeal  Result Value Ref Range Status   MRSA by PCR NEGATIVE NEGATIVE Final    Comment:        The GeneXpert MRSA Assay (FDA approved for NASAL specimens only), is one component of a comprehensive MRSA colonization surveillance program. It is not intended to diagnose MRSA infection nor to guide or monitor treatment for MRSA infections. Performed at [facility], [place] [address] [place], [place] [address]       Studies: No results found.  Scheduled Meds:  amLODipine  5 mg Oral Daily   aspirin EC  325 mg Oral Daily   atorvastatin  40 mg Oral Daily   Chlorhexidine Gluconate Cloth  6 each Topical Daily   dicyclomine  20 mg Oral TID AC   enoxaparin (LOVENOX) injection  40 mg Subcutaneous Q24H   fluticasone furoate-vilanterol  1 puff Inhalation Daily   furosemide  40 mg Oral Daily   hydrALAZINE  25 mg Oral Q8H   isosorbide dinitrate  10 mg Oral TID   loratadine  10 mg Oral QHS   nebivolol  20 mg Oral Daily   neomycin-polymyxin b-dexamethasone  1 drop Left Eye BID   pantoprazole  40 mg Oral BID   potassium chloride  40 mEq Oral Daily   predniSONE  20 mg Oral Q breakfast   sodium chloride flush  10-40 mL Intracatheter Q12H   sodium chloride flush  3 mL Intravenous Q12H   sodium chloride flush  3 mL Intravenous Q12H    Continuous Infusions:  sodium chloride  nitroGLYCERIN       LOS: 5 days     Kayleen Memos, MD [facility] Pager [phone]  If 7PM-7AM, please contact night-coverage [email or web address] Password [facility] 06/17/2019, 12:07 PM

## 2019-06-18 LAB — BASIC METABOLIC PANEL
Anion gap: 11 (ref 5–15)
BUN: 24 mg/dL — ABNORMAL HIGH (ref 6–20)
CO2: 26 mmol/L (ref 22–32)
Calcium: 9 mg/dL (ref 8.9–10.3)
Chloride: 103 mmol/L (ref 98–111)
Creatinine, Ser: 1.33 mg/dL — ABNORMAL HIGH (ref 0.44–1.00)
GFR calc Af Amer: 51 mL/min — ABNORMAL LOW (ref >60)
GFR calc non Af Amer: 44 mL/min — ABNORMAL LOW (ref >60)
Glucose, Bld: 118 mg/dL — ABNORMAL HIGH (ref 70–99)
Potassium: 3.9 mmol/L (ref 3.5–5.1)
Sodium: 140 mmol/L (ref 135–145)

## 2019-06-18 MED ORDER — AMLODIPINE BESYLATE 10 MG PO TABS
10.0000 mg | ORAL_TABLET | Freq: Every day | ORAL | Status: DC
  Administered 2019-06-18: 10 mg via ORAL
  Filled 2019-06-18 (×3): qty 1

## 2019-06-18 MED ORDER — SODIUM CHLORIDE 0.9% FLUSH: 3.0000 mL | INTRAVENOUS | Status: DC | PRN

## 2019-06-18 MED ORDER — ASPIRIN 81 MG PO CHEW
81.0000 mg | CHEWABLE_TABLET | ORAL | Status: AC
  Administered 2019-06-19: 06:00:00 81 mg via ORAL
  Filled 2019-06-18: qty 1

## 2019-06-18 MED ORDER — SODIUM CHLORIDE 0.9 % IV SOLN: 250.0000 mL | INTRAVENOUS | Status: DC | PRN

## 2019-06-18 MED ORDER — SODIUM CHLORIDE 0.9% FLUSH: 3.0000 mL | Freq: Two times a day (BID) | INTRAVENOUS | Status: DC

## 2019-06-18 MED ORDER — SODIUM CHLORIDE 0.9 % WEIGHT BASED INFUSION
3.0000 mL/kg/h | INTRAVENOUS | Status: DC
  Administered 2019-06-19: 3 mL/kg/h via INTRAVENOUS

## 2019-06-18 MED ORDER — SODIUM CHLORIDE 0.9 % WEIGHT BASED INFUSION: 1.0000 mL/kg/h | INTRAVENOUS | Status: DC

## 2019-06-18 NOTE — Progress Notes (Addendum)
Progress Note  Patient Name: Tracey Manning Date of Encounter: 06/18/2019  Primary Cardiologist: Quay Burow, MD   Subjective   BP elevated today. No chest pain. Initially in good spirits but becomes tearful thinking of her multiple medical conditions.   Inpatient Medications    Scheduled Meds: . amLODipine  10 mg Oral Daily  . aspirin EC  325 mg Oral Daily  . atorvastatin  40 mg Oral Daily  . Chlorhexidine Gluconate Cloth  6 each Topical Daily  . dicyclomine  20 mg Oral TID AC  . enoxaparin (LOVENOX) injection  40 mg Subcutaneous Q24H  . fluticasone furoate-vilanterol  1 puff Inhalation Daily  . furosemide  40 mg Oral Daily  . hydrALAZINE  25 mg Oral Q8H  . isosorbide dinitrate  10 mg Oral TID  . loratadine  10 mg Oral QHS  . nebivolol  20 mg Oral Daily  . neomycin-polymyxin b-dexamethasone  1 drop Left Eye BID  . pantoprazole  40 mg Oral BID  . potassium chloride  40 mEq Oral Daily  . sodium chloride flush  10-40 mL Intracatheter Q12H  . sodium chloride flush  3 mL Intravenous Q12H  . sodium chloride flush  3 mL Intravenous Q12H   Continuous Infusions: . sodium chloride 10 mL/hr at 06/18/19 0604  . nitroGLYCERIN 15 mcg/min (06/18/19 0604)   PRN Meds: sodium chloride, acetaminophen, albuterol, ALPRAZolam, hydrALAZINE, labetalol, LORazepam, morphine injection, nitroGLYCERIN, ondansetron (ZOFRAN) IV, oxyCODONE-acetaminophen **AND** oxyCODONE, sodium chloride flush, sodium chloride flush, zolpidem   Vital Signs    Vitals:   06/17/19 1359 06/17/19 1951 06/18/19 0558 06/18/19 0840  BP: (!) 141/84 (!) 160/88 (!) 168/104   Pulse:  67 70   Resp:  18 18   Temp:  97.9 F (36.6 C) 97.8 F (36.6 C)   TempSrc:  Oral Oral   SpO2: 100% 98% 97% 96%  Weight:   91.4 kg   Height:        Intake/Output Summary (Last 24 hours) at 06/18/2019 0842 Last data filed at 06/18/2019 0604 Gross per 24 hour  Intake 887.93 ml  Output 300 ml  Net 587.93 ml   Last 3 Weights  06/18/2019 06/17/2019 06/16/2019  Weight (lbs) 201 lb 8 oz 199 lb 6.4 oz 200 lb 9.6 oz  Weight (kg) 91.4 kg 90.447 kg 90.992 kg      Telemetry    Sinus rhythm with PVCs- Personally Reviewed  ECG    Yesterday - NSR, LAE, nonspecific T wave inversion - Personally Reviewed  Physical Exam   GEN: No acute distress.   Neck: No JVD Cardiac: RRR, no murmurs, rubs, or gallops.  Respiratory: Clear to auscultation bilaterally. GI: Soft, nontender, non-distended  MS: No edema; No deformity. Neuro:  Nonfocal  Psych: Normal affect   Labs    High Sensitivity Troponin:   Recent Labs  Lab 06/11/19 1214 06/11/19 1511 06/12/19 0547 06/17/19 0820  TROPONINIHS 43* 33.0* 29.0* 76*      Cardiac EnzymesNo results for input(s): TROPONINI in the last 168 hours. No results for input(s): TROPIPOC in the last 168 hours.   Chemistry Recent Labs  Lab 06/11/19 1214  06/13/19 0816 06/14/19 0216  06/16/19 0455 06/17/19 0421 06/18/19 0610  NA 140   < > 141 138   < > 138 137 140  K 3.4*   < > 4.4 4.5   < > 4.1 4.2 3.9  CL 104   < > 103 99   < > 101 102 103  CO2 23   < > 25 26   < > 27 26 26   GLUCOSE 120*   < > 133* 102*   < > 127* 108* 118*  BUN 16   < > 25* 41*   < > 17 21* 24*  CREATININE 1.05*   < > 1.37* 1.81*   < > 1.27* 1.39* 1.33*  CALCIUM 9.6   < > 9.2 8.7*   < > 9.0 9.1 9.0  PROT 8.3*  --  7.1 6.8  --   --   --   --   ALBUMIN 4.4  --  3.8 3.7  --   --   --   --   AST 20  --  30 25  --   --   --   --   ALT 22  --  51* 46*  --   --   --   --   ALKPHOS 79  --  63 60  --   --   --   --   BILITOT 0.7  --  0.6 0.6  --   --   --   --   GFRNONAA 59*   < > 43* 30*   < > 47* 42* 44*  GFRAA >60   < > 49* 35*   < > 54* 49* 51*  ANIONGAP 13   < > 13 13   < > 10 9 11    < > = values in this interval not displayed.     Hematology Recent Labs  Lab 06/15/19 1630 06/16/19 0455 06/17/19 0421  WBC 9.1 12.4* 11.9*  RBC 4.56 4.19 4.09  HGB 13.1 12.0 11.7*  HCT 39.3 36.0 35.9*  MCV 86.2 85.9  87.8  MCH 28.7 28.6 28.6  MCHC 33.3 33.3 32.6  RDW 16.8* 16.3* 16.4*  PLT 203 177 182    BNP Recent Labs  Lab 06/12/19 1411  BNP 600.7*     DDimer  Recent Labs  Lab 06/12/19 1411  DDIMER 0.72*     Radiology    Dg Chest Port 1 View  Result Date: 06/16/2019 CLINICAL DATA:  Shortness of breath with edema. EXAM: PORTABLE CHEST 1 VIEW COMPARISON:  06/12/2019 FINDINGS: Lungs are adequately inflated demonstrate improved hazy perihilar density with worsening patchy airspace opacification over the upper lobes right worse than left. Findings are concerning for multifocal pneumonia. No effusions. Stable cardiomegaly. Remainder of the exam is unchanged. IMPRESSION: Worsening patchy bilateral airspace process over the upper lobes right worse than left likely multifocal pneumonia. Stable cardiomegaly. Near resolution of previously noted hazy perihilar opacification. No effusions. Electronically Signed   By: Marin Olp M.D.   On: 06/16/2019 09:57    Cardiac Studies   IMPRESSIONS    1. The left ventricle has severely reduced systolic function, with an ejection fraction of 25-30%. The cavity size was moderately dilated. Left ventricular diastolic function could not be evaluated. Left ventricular diffuse hypokinesis.  2. The right ventricle has normal systolic function. The cavity was normal.  3. The mitral valve is grossly normal.  4. The aortic root is normal in size and structure.  5. The tricuspid valve is grossly normal.  6. The aortic valve is tricuspid. Mild thickening of the aortic valve. No stenosis of the aortic valve.  7. Severe global reduction in LV systolic function; moderate LVE; mild MR.  Patient Profile     58 y.o. female with PMH of CAD, tobacco abuse, HTN, and HLD presented with SOB/asthma  and found to have new cardiomyopathy. Cath 7/24 showed occluded RCA with severe stenosis of OM and prox LAD. COVID 19 negative.   Assessment & Plan    Principal Problem:    Dyspnea on exertion Active Problems:   Essential hypertension   Hyperlipidemia   Coronary artery disease   Chest pain   Acute on chronic diastolic HF (heart failure) (HCC)   AKI (acute kidney injury) ([place])  HTN - bp elevated today, amlodipine has been increased, continues on nitro infusion for chest pain. Continue isordil, hydralazine, bystolic.  - with elevated BP and renal function relatively stable though impaired, consider addition of losartan 25 mg daily if additional BP control needed.  Dyspnea- multifactorial, breathing comfortable.  Acute systolic HF - new CM, volume status challenging to assess but appears euvolemic, and breathing comfortably.  - continue lasix 40 mg po daily - continue KCl 40 mEq daily  CAD - not a candidate for CABG from previous evaluations during this hospital stay. Consider PCI on Monday, particularly if chest pain continues. On nitro gtt, no chest pain today - npo after midnight. ECG unremarkable during CP. hsTROP not significant elevated during episode of chest pain, though increased from admission (of unclear significance). - cath report suggests question of anterior wall viability, challenging to assess with MRI in setting of renal dysfunction and theoretical risk of nephrogenic systemic fibrosis with gadolinium contrast.    INFORMED CONSENT:  I have reviewed the risks, indications, and alternatives to cardiac catheterization, possible angioplasty, and stenting with the patient. Risks include but are not limited to bleeding, infection, vascular injury, stroke, myocardial infection, arrhythmia, kidney injury, radiation-related injury in the case of prolonged fluoroscopy use, emergency cardiac surgery, and death. The patient understands the risks of serious complication is 1-2 in 4709 with diagnostic cardiac cath and 1-2% or less with angioplasty/stenting.   HLD - continue atorvastatin 40 mg daily     For questions or updates, please contact Rew Please consult [email or web address] for contact info under      Signed, Elouise Munroe, MD  06/18/2019, 8:42 AM

## 2019-06-18 NOTE — Progress Notes (Addendum)
PROGRESS NOTE  Tracey Manning GYK:[id number] DOB: 14-Feb-1961 DOA: 06/11/2019 PCP: Nolene Ebbs, MD  HPI/Recap of past 24 hours: Tracey Hendersonis a 58 y.o.femalewith medical history significant ofCAD with multiple stents, HTN, HLD, COPD, anxiety presenting with shortness of breath and nonspecific chest pain.  She was admitted for shortness of breath and nonspecific chest pain.  Developed worsening SOB and orthopnea on hospital day 1 requiring transfer to stepdown.  Found to have HF exacerbation with low EF and imaging with edema.  Cardiology was consulted -planning L and R heart cath.  Transferred to [facility] on 06/15/2019 where she had left and right heart cath. Cath on 7/24 showed occluded RCA with collaterals tight OM and stenosis proximal to stent in mid LAD.  Had moderate pulmonary HTN and PCWP 37 mmHg.  Hospital course complicated by persistent chest discomfort. Started on nitro drip per cardiology.  Possible PCI on Monday 06/19/19.    06/18/19: Patient was seen and examined at her bedside.  She states she feels better today.  Denies chest pain on nitro drip.  Denies dyspnea at rest.  O2 saturation 97% on room air.  Planned PCI tomorrow 06/19/2019 per cardiology.    Assessment/Plan: Principal Problem:   Dyspnea on exertion Active Problems:   Essential hypertension   Hyperlipidemia   Coronary artery disease   Chest pain   Acute on chronic diastolic HF (heart failure) (HCC)   AKI (acute kidney injury) ([place])  Newly diagnosed cardiomyopathy/acute systolic CHF Post left and right heart cath which showed occluded RCA with collaterals tight OM and stenosis proximal to stent in mid LAD.  Had moderate pulmonary HTN and PCWP 37 mmHg Per cardiology not a good candidate for CABG due to COPD, ongoing tobacco use, history of noncompliance, and poor functional status. Last 2D echo done during this admission revealed LVEF 25 to 79% Continue Bystolic, Isordil, hydralazine, once renal function  stable consider Entresto per cardiology Continue strict I's and O's and daily weight Chest discomfort, started on nitro drip Possible PCI Monday 06/19/19 Net I&O -2.9 L since admission  Uncontrolled hypertension Currently on nitro drip for previously persistent chest pain Increase Norvasc to 10 mg Continue p.o. hydralazine 25 mg 3 times daily, nebivolol 20 mg daily, p.o. Lasix 40 mg daily  Resolving persistent chest pain Denies chest pain this morning on nitro drip Twelve-lead EKG nonspecific Troponin S trended down, peaked at 33 Possible PCI tomorrow 06/19/2019  CKD 3 Baseline creatinine 1.2 with GFR 54>> cr.1.39>> 1.33 Continue to avoid nephrotoxins Monitor urine output Continue daily BMP  Pulmonary edema likely cardiogenic, improving Independently viewed chest x-ray done on admission which showed cardiomegaly with increase in pulmonary vascularity Repeated chest x-ray done on 06/16/2019, reviewed personally shows increased markings in upper lobes greater on the right Lactic acid and procalcitonin negative Maintain O2 saturation greater than 92% On p.o. Lasix, 40 mg daily O2 saturation 97% on room air.   Chest discomfort Post heart cath on 06/16/2019 with findings as stated above Nitro drip per cardiology  Coronary artery disease Continue Lipitor and aspirin Not a good candidate for CABG per cardiology due to uncontrolled COPD, ongoing tobacco use with poor functional status When CHF is improved cardiology considering PCI/stent of OM and mid LAD possibly Monday, 06/19/2019  Acute on chronic hypoxic respiratory failure likely secondary to acute systolic CHF with pulmonary edema in the setting of COPD and ongoing tobacco use Management as stated above  COPD on 2 L nasal cannula continuously at baseline started 2 years  ago, noncompliant with oxygen therapy at home Maintain O2 saturation greater than 92% Continue bronchodilators  Leukocytosis, likely reactive post heart cath  WBC 12 K >> 11.9K Afebrile No sign of active infective process Procalcitonin and lactic acid negative on 06/17/2019  Hyperlipidemia Continue Lipitor  DVT prophylaxis: lovenox. Code Status: full  Family Communication discussed with the patient in detail  Disposition Plan:  Possible discharge to home in 2 to 3 days when cardiology signs off.  Consultants:   Cardiology  Procedures:  Echo IMPRESSIONS   1. The left ventricle has severely reduced systolic function, with an ejection fraction of 25-30%. The cavity size was moderately dilated. Left ventricular diastolic function could not be evaluated. Left ventricular diffuse hypokinesis. 2. The right ventricle has normal systolic function. The cavity was normal. 3. The mitral valve is grossly normal. 4. The aortic root is normal in size and structure. 5. The tricuspid valve is grossly normal. 6. The aortic valve is tricuspid. Mild thickening of the aortic valve. No stenosis of the aortic valve. 7. Severe global reduction in LV systolic function; moderate LVE; mild MR.  LE [place] Summary: Right: There is no evidence of deep vein thrombosis in the lower extremity. No cystic structure found in the popliteal fossa. Left: There is no evidence of deep vein thrombosis in the lower extremity. No cystic structure found in the popliteal fossa.     Objective: Vitals:   06/17/19 1359 06/17/19 1951 06/18/19 0558 06/18/19 0840  BP: (!) 141/84 (!) 160/88 (!) 168/104   Pulse:  67 70   Resp:  18 18   Temp:  97.9 F (36.6 C) 97.8 F (36.6 C)   TempSrc:  Oral Oral   SpO2: 100% 98% 97% 96%  Weight:   91.4 kg   Height:        Intake/Output Summary (Last 24 hours) at 06/18/2019 1143 Last data filed at 06/18/2019 0604 Gross per 24 hour  Intake 647.93 ml  Output 300 ml  Net 347.93 ml   Filed Weights   06/16/19 0500 06/17/19 0431 06/18/19 0558  Weight: 91 kg 90.4 kg 91.4 kg    Exam:  . General: 58 y.o. year-old female  well-developed well-nourished in no acute distress.  Alert and oriented x3.   . Cardiovascular: Regular rate and rhythm no rubs or gallops no JVD or thyromegaly. Marland Kitchen Respiratory: Very faint rales at bases no wheezes noted.  Poor inspiratory effort. . Abdomen: Soft nontender nondistended normal bowel sounds x4 present. . Musculoskeletal: No lower extremity edema.  Total focal proximal 4 extremities. Marland Kitchen Psychiatry: Mood is appropriate for condition and setting.   Data Reviewed: CBC: Recent Labs  Lab 06/11/19 1214  06/14/19 0216 06/15/19 0501  06/15/19 1032 06/15/19 1046 06/15/19 1630 06/16/19 0455 06/17/19 0421  WBC 8.1   < > 15.2* 11.1*  --   --   --  9.1 12.4* 11.9*  NEUTROABS 7.2  --   --   --   --   --   --   --   --   --   HGB 14.2   < > 12.1 12.5   < > 12.2 11.9* 13.1 12.0 11.7*  HCT 44.3   < > 38.8 39.4   < > 36.0 35.0* 39.3 36.0 35.9*  MCV 85.9   < > 89.8 88.5  --   --   --  86.2 85.9 87.8  PLT 232   < > 213 195  --   --   --  203 177 182   < > = values in this interval not displayed.   Basic Metabolic Panel: Recent Labs  Lab 06/14/19 0216 06/15/19 0501  06/15/19 1032 06/15/19 1046 06/15/19 1630 06/16/19 0455 06/17/19 0421 06/18/19 0610  NA 138 137   < > 141 139  --  138 137 140  K 4.5 4.8   < > 4.2 4.2  --  4.1 4.2 3.9  CL 99 102  --   --   --   --  101 102 103  CO2 26 28  --   --   --   --  27 26 26   GLUCOSE 102* 98  --   --   --   --  127* 108* 118*  BUN 41* 30*  --   --   --   --  17 21* 24*  CREATININE 1.81* 1.45*  --   --   --  1.39* 1.27* 1.39* 1.33*  CALCIUM 8.7* 8.8*  --   --   --   --  9.0 9.1 9.0  MG 2.2  --   --   --   --   --   --   --   --    < > = values in this interval not displayed.   GFR: Estimated Creatinine Clearance: 48 mL/min (A) (by C-G formula based on SCr of 1.33 mg/dL (H)). Liver Function Tests: Recent Labs  Lab 06/11/19 1214 06/13/19 0816 06/14/19 0216  AST 20 30 25   ALT 22 51* 46*  ALKPHOS 79 63 60  BILITOT 0.7 0.6 0.6   PROT 8.3* 7.1 6.8  ALBUMIN 4.4 3.8 3.7   No results for input(s): LIPASE, AMYLASE in the last 168 hours. No results for input(s): AMMONIA in the last 168 hours. Coagulation Profile: No results for input(s): INR, PROTIME in the last 168 hours. Cardiac Enzymes: Recent Labs  Lab 06/11/19 1511 06/11/19 1819 06/11/19 2001  CKTOTAL 98 100 100  CKMB 5.4* 5.1* 4.9   BNP (last 3 results) No results for input(s): PROBNP in the last 8760 hours. HbA1C: No results for input(s): HGBA1C in the last 72 hours. CBG: No results for input(s): GLUCAP in the last 168 hours. Lipid Profile: No results for input(s): CHOL, HDL, LDLCALC, TRIG, CHOLHDL, LDLDIRECT in the last 72 hours. Thyroid Function Tests: No results for input(s): TSH, T4TOTAL, FREET4, T3FREE, THYROIDAB in the last 72 hours. Anemia Panel: No results for input(s): VITAMINB12, FOLATE, FERRITIN, TIBC, IRON, RETICCTPCT in the last 72 hours. Urine analysis:    Component Value Date/Time   COLORURINE YELLOW 06/13/2019 1411   APPEARANCEUR CLOUDY (A) 06/13/2019 1411   LABSPEC 1.021 06/13/2019 1411   PHURINE 5.0 06/13/2019 1411   GLUCOSEU NEGATIVE 06/13/2019 1411   HGBUR NEGATIVE 06/13/2019 1411   BILIRUBINUR NEGATIVE 06/13/2019 1411   BILIRUBINUR neg 07/07/2017 1630   KETONESUR NEGATIVE 06/13/2019 1411   PROTEINUR NEGATIVE 06/13/2019 1411   UROBILINOGEN 0.2 07/07/2017 1630   UROBILINOGEN 0.2 03/28/2015 1236   NITRITE NEGATIVE 06/13/2019 1411   LEUKOCYTESUR LARGE (A) 06/13/2019 1411   Sepsis Labs: @LABRCNTIP (procalcitonin:4,lacticidven:4)  ) Recent Results (from the past 240 hour(s))  SARS Coronavirus 2 (CEPHEID- Performed in [place] hospital lab), Hosp Order     Status: None   Collection Time: 06/11/19 12:14 PM   Specimen: Nasopharyngeal Swab  Result Value Ref Range Status   SARS Coronavirus 2 NEGATIVE NEGATIVE Final    Comment: (NOTE) If result is NEGATIVE SARS-CoV-2 target nucleic acids are NOT  DETECTED. The SARS-CoV-2  RNA is generally detectable in upper and lower  respiratory specimens during the acute phase of infection. The lowest  concentration of SARS-CoV-2 viral copies this assay can detect is 250  copies / mL. A negative result does not preclude SARS-CoV-2 infection  and should not be used as the sole basis for treatment or other  patient management decisions.  A negative result may occur with  improper specimen collection / handling, submission of specimen other  than nasopharyngeal swab, presence of viral mutation(s) within the  areas targeted by this assay, and inadequate number of viral copies  (<250 copies / mL). A negative result must be combined with clinical  observations, patient history, and epidemiological information. If result is POSITIVE SARS-CoV-2 target nucleic acids are DETECTED. The SARS-CoV-2 RNA is generally detectable in upper and lower  respiratory specimens dur ing the acute phase of infection.  Positive  results are indicative of active infection with SARS-CoV-2.  Clinical  correlation with patient history and other diagnostic information is  necessary to determine patient infection status.  Positive results do  not rule out bacterial infection or co-infection with other viruses. If result is PRESUMPTIVE POSTIVE SARS-CoV-2 nucleic acids MAY BE PRESENT.   A presumptive positive result was obtained on the submitted specimen  and confirmed on repeat testing.  While 2019 novel coronavirus  (SARS-CoV-2) nucleic acids may be present in the submitted sample  additional confirmatory testing may be necessary for epidemiological  and / or clinical management purposes  to differentiate between  SARS-CoV-2 and other Sarbecovirus currently known to infect humans.  If clinically indicated additional testing with an alternate test  methodology [phone]) is advised. The SARS-CoV-2 RNA is generally  detectable in upper and lower respiratory sp ecimens during the acute  phase of  infection. The expected result is Negative. Fact Sheet for Patients:  StrictlyIdeas.no Fact Sheet for Healthcare Providers: BankingDealers.co.za This test is not yet approved or cleared by the [place] FDA and has been authorized for detection and/or diagnosis of SARS-CoV-2 by FDA under an Emergency Use Authorization (EUA).  This EUA will remain in effect (meaning this test can be used) for the duration of the COVID-19 declaration under Section 564(b)(1) of the Act, 21 U.S.C. section 360bbb-3(b)(1), unless the authorization is terminated or revoked sooner. Performed at [facility], [place] [address], [place], [place] [address]   MRSA PCR Screening     Status: None   Collection Time: 06/13/19  4:16 AM   Specimen: Nasal Mucosa; Nasopharyngeal  Result Value Ref Range Status   MRSA by PCR NEGATIVE NEGATIVE Final    Comment:        The GeneXpert MRSA Assay (FDA approved for NASAL specimens only), is one component of a comprehensive MRSA colonization surveillance program. It is not intended to diagnose MRSA infection nor to guide or monitor treatment for MRSA infections. Performed at [facility], [place] [address], [place], [place] [address]       Studies: No results found.  Scheduled Meds: . amLODipine  10 mg Oral Daily  . [START ON 06/19/2019] aspirin  81 mg Oral Pre-Cath  . aspirin EC  325 mg Oral Daily  . atorvastatin  40 mg Oral Daily  . Chlorhexidine Gluconate Cloth  6 each Topical Daily  . dicyclomine  20 mg Oral TID AC  . enoxaparin (LOVENOX) injection  40 mg Subcutaneous Q24H  . fluticasone furoate-vilanterol  1 puff Inhalation Daily  . furosemide  40 mg Oral Daily  . hydrALAZINE  25 mg Oral Q8H  . loratadine  10 mg Oral QHS  . nebivolol  20 mg Oral Daily  . neomycin-polymyxin b-dexamethasone  1 drop Left Eye BID  . pantoprazole  40 mg Oral BID  . potassium chloride  40 mEq Oral  Daily  . sodium chloride flush  10-40 mL Intracatheter Q12H  . sodium chloride flush  3 mL Intravenous Q12H  . sodium chloride flush  3 mL Intravenous Q12H  . sodium chloride flush  3 mL Intravenous Q12H    Continuous Infusions: . sodium chloride 10 mL/hr at 06/18/19 0604  . sodium chloride    . [START ON 06/19/2019] sodium chloride     Followed by  . [START ON 06/19/2019] sodium chloride    . nitroGLYCERIN 15 mcg/min (06/18/19 0604)     LOS: 6 days     Tracey Memos, MD [facility] Pager [phone]  If 7PM-7AM, please contact night-coverage [email or web address] Password [facility] 06/18/2019, 11:43 AM

## 2019-06-19 ENCOUNTER — Other Ambulatory Visit: Payer: Self-pay

## 2019-06-19 ENCOUNTER — Encounter (HOSPITAL_COMMUNITY): Payer: Self-pay | Admitting: Cardiovascular Disease

## 2019-06-19 ENCOUNTER — Encounter (HOSPITAL_COMMUNITY)
Admission: EM | Disposition: A | Discharge status: Home / Self Care | Payer: Self-pay | Source: Home / Self Care | Attending: Internal Medicine

## 2019-06-19 ENCOUNTER — Ambulatory Visit (HOSPITAL_COMMUNITY): Payer: Medicaid Other | Attending: Obstetrics

## 2019-06-19 DIAGNOSIS — G4733 Obstructive sleep apnea (adult) (pediatric): Secondary | ICD-10-CM

## 2019-06-19 DIAGNOSIS — E785 Hyperlipidemia, unspecified: Secondary | ICD-10-CM

## 2019-06-19 DIAGNOSIS — N179 Acute kidney failure, unspecified: Secondary | ICD-10-CM

## 2019-06-19 DIAGNOSIS — I2510 Atherosclerotic heart disease of native coronary artery without angina pectoris: Secondary | ICD-10-CM

## 2019-06-19 HISTORY — PX: CORONARY STENT INTERVENTION: CATH118234

## 2019-06-19 LAB — BASIC METABOLIC PANEL
Anion gap: 13 (ref 5–15)
BUN: 19 mg/dL (ref 6–20)
CO2: 24 mmol/L (ref 22–32)
Calcium: 8 mg/dL — ABNORMAL LOW (ref 8.9–10.3)
Chloride: 103 mmol/L (ref 98–111)
Creatinine, Ser: 1.19 mg/dL — ABNORMAL HIGH (ref 0.44–1.00)
GFR calc Af Amer: 59 mL/min — ABNORMAL LOW (ref >60)
GFR calc non Af Amer: 51 mL/min — ABNORMAL LOW (ref >60)
Glucose, Bld: 88 mg/dL (ref 70–99)
Potassium: 3.5 mmol/L (ref 3.5–5.1)
Sodium: 140 mmol/L (ref 135–145)

## 2019-06-19 LAB — POCT ACTIVATED CLOTTING TIME
Activated Clotting Time: 340 seconds
Activated Clotting Time: 373 seconds

## 2019-06-19 SURGERY — CORONARY STENT INTERVENTION
Anesthesia: LOCAL

## 2019-06-19 MED ORDER — HYDRALAZINE HCL 20 MG/ML IJ SOLN
10.0000 mg | Freq: Four times a day (QID) | INTRAMUSCULAR | Status: DC | PRN
  Administered 2019-06-19: 10 mg via INTRAVENOUS

## 2019-06-19 MED ORDER — HYDRALAZINE HCL 20 MG/ML IJ SOLN
10.0000 mg | INTRAMUSCULAR | Status: AC | PRN
  Filled 2019-06-19: qty 1

## 2019-06-19 MED ORDER — ATORVASTATIN CALCIUM 80 MG PO TABS: 80.0000 mg | ORAL_TABLET | Freq: Every day | ORAL | Status: DC

## 2019-06-19 MED ORDER — HYDRALAZINE HCL 50 MG PO TABS
50.0000 mg | ORAL_TABLET | Freq: Three times a day (TID) | ORAL | Status: DC
  Administered 2019-06-19 – 2019-06-20 (×4): 50 mg via ORAL
  Filled 2019-06-19 (×4): qty 1

## 2019-06-19 MED ORDER — CLOPIDOGREL BISULFATE 75 MG PO TABS
75.0000 mg | ORAL_TABLET | Freq: Every day | ORAL | Status: DC
  Administered 2019-06-20: 75 mg via ORAL
  Filled 2019-06-19: qty 1

## 2019-06-19 MED ORDER — VERAPAMIL HCL 2.5 MG/ML IV SOLN
INTRA_ARTERIAL | Status: DC | PRN
  Administered 2019-06-19: 11:00:00 15 mL via INTRA_ARTERIAL

## 2019-06-19 MED ORDER — FAMOTIDINE IN NACL 20-0.9 MG/50ML-% IV SOLN
INTRAVENOUS | Status: AC | PRN
  Administered 2019-06-19: 20 mg via INTRAVENOUS

## 2019-06-19 MED ORDER — IOHEXOL 350 MG/ML SOLN
INTRAVENOUS | Status: DC | PRN
  Administered 2019-06-19: 155 mL via INTRA_ARTERIAL

## 2019-06-19 MED ORDER — HEPARIN (PORCINE) IN NACL 1000-0.9 UT/500ML-% IV SOLN
INTRAVENOUS | Status: AC
  Filled 2019-06-19: qty 1000

## 2019-06-19 MED ORDER — ATORVASTATIN CALCIUM 80 MG PO TABS
80.0000 mg | ORAL_TABLET | Freq: Every day | ORAL | Status: DC
  Administered 2019-06-19 – 2019-06-20 (×2): 80 mg via ORAL
  Filled 2019-06-19 (×2): qty 1

## 2019-06-19 MED ORDER — ONDANSETRON HCL 4 MG/2ML IJ SOLN: 4.0000 mg | Freq: Four times a day (QID) | INTRAMUSCULAR | Status: DC | PRN

## 2019-06-19 MED ORDER — FENTANYL CITRATE (PF) 100 MCG/2ML IJ SOLN
INTRAMUSCULAR | Status: DC | PRN
  Administered 2019-06-19 (×3): 25 ug via INTRAVENOUS

## 2019-06-19 MED ORDER — NITROGLYCERIN 1 MG/10 ML FOR IR/CATH LAB
INTRA_ARTERIAL | Status: AC
  Filled 2019-06-19: qty 10

## 2019-06-19 MED ORDER — SODIUM CHLORIDE 0.9% FLUSH: 3.0000 mL | INTRAVENOUS | Status: DC | PRN

## 2019-06-19 MED ORDER — LABETALOL HCL 5 MG/ML IV SOLN
10.0000 mg | INTRAVENOUS | Status: DC | PRN
  Filled 2019-06-19: qty 4

## 2019-06-19 MED ORDER — SODIUM CHLORIDE 0.9 % IV SOLN
INTRAVENOUS | Status: AC
  Administered 2019-06-19: 13:00:00 via INTRAVENOUS

## 2019-06-19 MED ORDER — ACETAMINOPHEN 325 MG PO TABS: 650.0000 mg | ORAL_TABLET | ORAL | Status: DC | PRN

## 2019-06-19 MED ORDER — NITROGLYCERIN 1 MG/10 ML FOR IR/CATH LAB
INTRA_ARTERIAL | Status: DC | PRN
  Administered 2019-06-19: 200 ug via INTRACORONARY

## 2019-06-19 MED ORDER — NITROGLYCERIN 0.4 MG SL SUBL
SUBLINGUAL_TABLET | SUBLINGUAL | Status: AC
  Filled 2019-06-19: qty 1

## 2019-06-19 MED ORDER — LABETALOL HCL 5 MG/ML IV SOLN: 10.0000 mg | INTRAVENOUS | Status: AC | PRN

## 2019-06-19 MED ORDER — FENTANYL CITRATE (PF) 100 MCG/2ML IJ SOLN
INTRAMUSCULAR | Status: AC
  Filled 2019-06-19: qty 2

## 2019-06-19 MED ORDER — HEPARIN SODIUM (PORCINE) 1000 UNIT/ML IJ SOLN
INTRAMUSCULAR | Status: DC | PRN
  Administered 2019-06-19: 9000 [IU] via INTRAVENOUS

## 2019-06-19 MED ORDER — LIDOCAINE HCL (PF) 1 % IJ SOLN
INTRAMUSCULAR | Status: AC
  Filled 2019-06-19: qty 30

## 2019-06-19 MED ORDER — ASPIRIN 81 MG PO CHEW: 81.0000 mg | CHEWABLE_TABLET | Freq: Every day | ORAL | Status: DC

## 2019-06-19 MED ORDER — VERAPAMIL HCL 2.5 MG/ML IV SOLN
INTRAVENOUS | Status: AC
  Filled 2019-06-19: qty 2

## 2019-06-19 MED ORDER — SODIUM CHLORIDE 0.9 % IV SOLN: 250.0000 mL | INTRAVENOUS | Status: DC | PRN

## 2019-06-19 MED ORDER — HEPARIN SODIUM (PORCINE) 1000 UNIT/ML IJ SOLN
INTRAMUSCULAR | Status: AC
  Filled 2019-06-19: qty 1

## 2019-06-19 MED ORDER — SODIUM CHLORIDE 0.9% FLUSH: 3.0000 mL | Freq: Two times a day (BID) | INTRAVENOUS | Status: DC

## 2019-06-19 MED ORDER — NITROGLYCERIN IN D5W 200-5 MCG/ML-% IV SOLN
INTRAVENOUS | Status: AC
  Filled 2019-06-19: qty 250

## 2019-06-19 MED ORDER — CLOPIDOGREL BISULFATE 300 MG PO TABS
ORAL_TABLET | ORAL | Status: DC | PRN
  Administered 2019-06-19: 600 mg via ORAL

## 2019-06-19 MED ORDER — CLOPIDOGREL BISULFATE 300 MG PO TABS
ORAL_TABLET | ORAL | Status: AC
  Filled 2019-06-19: qty 2

## 2019-06-19 MED ORDER — NITROGLYCERIN IN D5W 200-5 MCG/ML-% IV SOLN
INTRAVENOUS | Status: AC | PRN
  Administered 2019-06-19: 10 ug/min via INTRAVENOUS

## 2019-06-19 MED ORDER — HEPARIN (PORCINE) IN NACL 1000-0.9 UT/500ML-% IV SOLN
INTRAVENOUS | Status: DC | PRN
  Administered 2019-06-19: 500 mL

## 2019-06-19 MED ORDER — FAMOTIDINE IN NACL 20-0.9 MG/50ML-% IV SOLN
INTRAVENOUS | Status: AC
  Filled 2019-06-19: qty 50

## 2019-06-19 MED ORDER — MIDAZOLAM HCL 2 MG/2ML IJ SOLN
INTRAMUSCULAR | Status: AC
  Filled 2019-06-19: qty 2

## 2019-06-19 MED ORDER — MIDAZOLAM HCL 2 MG/2ML IJ SOLN
INTRAMUSCULAR | Status: DC | PRN
  Administered 2019-06-19: 1 mg via INTRAVENOUS

## 2019-06-19 MED ORDER — ASPIRIN EC 81 MG PO TBEC
81.0000 mg | DELAYED_RELEASE_TABLET | Freq: Every day | ORAL | Status: DC
  Administered 2019-06-19 – 2019-06-20 (×2): 81 mg via ORAL
  Filled 2019-06-19 (×2): qty 1

## 2019-06-19 MED ORDER — LIDOCAINE HCL (PF) 1 % IJ SOLN
INTRAMUSCULAR | Status: DC | PRN
  Administered 2019-06-19: 3 mL

## 2019-06-19 MED ORDER — NITROGLYCERIN 0.4 MG SL SUBL
SUBLINGUAL_TABLET | SUBLINGUAL | Status: DC | PRN
  Administered 2019-06-19: .4 mg via SUBLINGUAL

## 2019-06-19 SURGICAL SUPPLY — 21 items
BAG SNAP BAND KOVER 36X36 (MISCELLANEOUS) ×1 IMPLANT
BALLN SAPPHIRE 2.0X12 (BALLOONS) ×2
BALLN SAPPHIRE ~~LOC~~ 3.25X12 (BALLOONS) ×1 IMPLANT
BALLN SAPPHIRE ~~LOC~~ 3.75X8 (BALLOONS) ×1 IMPLANT
BALLOON SAPPHIRE 2.0X12 (BALLOONS) IMPLANT
BAND CMPR LRG ZPHR (HEMOSTASIS) ×1
BAND ZEPHYR COMPRESS 30 LONG (HEMOSTASIS) ×1 IMPLANT
CATH VISTA GUIDE 6FR XBLAD3.5 (CATHETERS) ×1 IMPLANT
COVER DOME SNAP 22 D (MISCELLANEOUS) ×1 IMPLANT
GLIDESHEATH SLEND A-KIT 6F 22G (SHEATH) ×1 IMPLANT
GUIDEWIRE INQWIRE 1.5J.035X260 (WIRE) IMPLANT
INQWIRE 1.5J .035X260CM (WIRE) ×2
KIT ENCORE 26 ADVANTAGE (KITS) ×1 IMPLANT
KIT HEART LEFT (KITS) ×2 IMPLANT
PACK CARDIAC CATHETERIZATION (CUSTOM PROCEDURE TRAY) ×2 IMPLANT
STENT SYNERGY DES 3.5X12 (Permanent Stent) ×1 IMPLANT
STENT SYNERGY DES 3X12 (Permanent Stent) ×1 IMPLANT
TRANSDUCER W/STOPCOCK (MISCELLANEOUS) ×2 IMPLANT
TUBING CIL FLEX 10 FLL-RA (TUBING) ×2 IMPLANT
WIRE ASAHI PROWATER 180CM (WIRE) ×2 IMPLANT
WIRE HI TORQ VERSACORE-J 145CM (WIRE) ×1 IMPLANT

## 2019-06-19 NOTE — Progress Notes (Signed)
TR band removed at 9914 w/o any complications, gauze/transparent dressing applied

## 2019-06-19 NOTE — Progress Notes (Signed)
PROGRESS NOTE  Sanaii Caporaso FGH:[id number] DOB: 05-06-1961 DOA: 06/11/2019 PCP: Nolene Ebbs, MD  HPI/Recap of past 24 hours: Tracey Hendersonis a 58 y.o.femalewith medical history significant ofCAD with multiple stents, HTN, HLD, COPD, anxiety presenting with shortness of breath and nonspecific chest pain.  She was admitted for shortness of breath and nonspecific chest pain.  Developed worsening SOB and orthopnea on hospital day 1 requiring transfer to stepdown.  Found to have HF exacerbation with low EF and imaging with edema.  Cardiology was consulted -planning L and R heart cath.  Transferred to [facility] on 06/15/2019 where she had left and right heart cath. Cath on 7/24 showed occluded RCA with collaterals tight OM and stenosis proximal to stent in mid LAD.  Had moderate pulmonary HTN and PCWP 37 mmHg.  Hospital course complicated by persistent chest discomfort. Started on nitro drip per cardiology.  Possible PCI on Monday 06/19/19.    06/19/19: Patient was seen and examined at her bedside this morning.  She has no acute complaints.  She denies chest pain, dyspnea or palpitations.  Renal function improved.  Planned PCI today.    Assessment/Plan: Principal Problem:   Dyspnea on exertion Active Problems:   Essential hypertension   Hyperlipidemia   Coronary artery disease   Chest pain   Acute on chronic diastolic HF (heart failure) (HCC)   AKI (acute kidney injury) (HCC)   CHF (congestive heart failure) ([place])  Newly diagnosed cardiomyopathy/acute systolic CHF Post left and right heart cath which showed occluded RCA with collaterals tight OM and stenosis proximal to stent in mid LAD.  Had moderate pulmonary HTN and PCWP 37 mmHg Per cardiology not a good candidate for CABG due to COPD, ongoing tobacco use, history of noncompliance, and poor functional status. Last 2D echo done during this admission revealed LVEF 25 to 67% Continue Bystolic, Isordil, hydralazine, once renal function  stable consider Entresto per cardiology Continue strict I's and O's and daily weight Chest discomfort, started on nitro drip Possible PCI Monday 06/19/19 Net I&O -3.0 L since admission  Resolved uncontrolled hypertension Currently on nitro drip for previously persistent chest pain Continue Norvasc to 10 mg Continue p.o. hydralazine 25 mg 3 times daily, nebivolol 20 mg daily, p.o. Lasix 40 mg daily  Resolving persistent chest pain Denies chest pain this morning on nitro drip Twelve-lead EKG nonspecific Troponin S trended down, peaked at 33 Possible PCI tomorrow 06/19/2019  CKD 3 Baseline creatinine around 1.2 with GFR 54>> cr.1.39>> 1.33>>1.19 Creatinine is back to baseline 1.19 with GFR of 59 Continue to avoid nephrotoxins Monitor urine output Continue daily BMP  Hypokalemia, repleted On maintenance potassium supplement 40 mEq daily Repeat BMP in the morning  Pulmonary edema likely cardiogenic, improving Independently viewed chest x-ray done on admission which showed cardiomegaly with increase in pulmonary vascularity Repeated chest x-ray done on 06/16/2019, reviewed personally shows increased markings in upper lobes greater on the right Lactic acid and procalcitonin negative Maintain O2 saturation greater than 92% On p.o. Lasix, 40 mg daily O2 saturation 97% on room air.   Chest discomfort Post heart cath on 06/16/2019 with findings as stated above Nitro drip per cardiology  Coronary artery disease Continue Lipitor and aspirin Not a good candidate for CABG per cardiology due to uncontrolled COPD, ongoing tobacco use with poor functional status When CHF is improved cardiology considering PCI/stent of OM and mid LAD possibly Monday, 06/19/2019  Acute on chronic hypoxic respiratory failure likely secondary to acute systolic CHF with pulmonary edema in  the setting of COPD and ongoing tobacco use Management as stated above  COPD on 2 L nasal cannula continuously at baseline  started 2 years ago, noncompliant with oxygen therapy at home Maintain O2 saturation greater than 92% Continue bronchodilators  Leukocytosis, likely reactive post heart cath WBC 12 K >> 11.9K Afebrile No sign of active infective process Procalcitonin and lactic acid negative on 06/17/2019  Hyperlipidemia Continue Lipitor  DVT prophylaxis: lovenox. Code Status: full  Family Communication discussed with the patient in detail  Disposition Plan:  Possible discharge to home in 1-2 days when cardiology signs off.  Consultants:   Cardiology  Procedures:  Echo IMPRESSIONS   1. The left ventricle has severely reduced systolic function, with an ejection fraction of 25-30%. The cavity size was moderately dilated. Left ventricular diastolic function could not be evaluated. Left ventricular diffuse hypokinesis. 2. The right ventricle has normal systolic function. The cavity was normal. 3. The mitral valve is grossly normal. 4. The aortic root is normal in size and structure. 5. The tricuspid valve is grossly normal. 6. The aortic valve is tricuspid. Mild thickening of the aortic valve. No stenosis of the aortic valve. 7. Severe global reduction in LV systolic function; moderate LVE; mild MR.  LE [place] Summary: Right: There is no evidence of deep vein thrombosis in the lower extremity. No cystic structure found in the popliteal fossa. Left: There is no evidence of deep vein thrombosis in the lower extremity. No cystic structure found in the popliteal fossa.     Objective: Vitals:   06/19/19 1227 06/19/19 1228 06/19/19 1233 06/19/19 1315  BP: (!) 202/108 (!) 188/109 (!) 187/109 (!) 156/81  Pulse: 63 69 68 63  Resp: 17 19 12 17   Temp:      TempSrc:      SpO2: 100% 100% 100% 99%  Weight:      Height:        Intake/Output Summary (Last 24 hours) at 06/19/2019 1352 Last data filed at 06/19/2019 1300 Gross per 24 hour  Intake 1128.69 ml  Output 1201 ml  Net -72.31 ml    Filed Weights   06/17/19 0431 06/18/19 0558 06/19/19 0419  Weight: 90.4 kg 91.4 kg 91.9 kg    Exam:  . General: 58 y.o. year-old female well-developed well-nourished in no acute distress.  Alert and oriented x3.   . Cardiovascular: Regular rate and rhythm no rubs no gallops no JVD no thyromegaly . Respiratory: Clear 2 auscultation no wheezes or rales.  Poor inspiratory effort.   . Abdomen: Soft nontender nondistended with normal bowel sounds x4.. . Musculoskeletal: No lower extremity edema.  2 out of 4 pulses in all 4 extremities. Marland Kitchen Psychiatry: Mood is appropriate for condition and setting.   Data Reviewed: CBC: Recent Labs  Lab 06/14/19 0216 06/15/19 0501  06/15/19 1032 06/15/19 1046 06/15/19 1630 06/16/19 0455 06/17/19 0421  WBC 15.2* 11.1*  --   --   --  9.1 12.4* 11.9*  HGB 12.1 12.5   < > 12.2 11.9* 13.1 12.0 11.7*  HCT 38.8 39.4   < > 36.0 35.0* 39.3 36.0 35.9*  MCV 89.8 88.5  --   --   --  86.2 85.9 87.8  PLT 213 195  --   --   --  203 177 182   < > = values in this interval not displayed.   Basic Metabolic Panel: Recent Labs  Lab 06/14/19 0216 06/15/19 0501  06/15/19 1046 06/15/19 1630 06/16/19 0455 06/17/19 0421  06/18/19 0610 06/19/19 0432  NA 138 137   < > 139  --  138 137 140 140  K 4.5 4.8   < > 4.2  --  4.1 4.2 3.9 3.5  CL 99 102  --   --   --  101 102 103 103  CO2 26 28  --   --   --  27 26 26 24   GLUCOSE 102* 98  --   --   --  127* 108* 118* 88  BUN 41* 30*  --   --   --  17 21* 24* 19  CREATININE 1.81* 1.45*  --   --  1.39* 1.27* 1.39* 1.33* 1.19*  CALCIUM 8.7* 8.8*  --   --   --  9.0 9.1 9.0 8.0*  MG 2.2  --   --   --   --   --   --   --   --    < > = values in this interval not displayed.   GFR: Estimated Creatinine Clearance: 53.9 mL/min (A) (by C-G formula based on SCr of 1.19 mg/dL (H)). Liver Function Tests: Recent Labs  Lab 06/13/19 0816 06/14/19 0216  AST 30 25  ALT 51* 46*  ALKPHOS 63 60  BILITOT 0.6 0.6  PROT 7.1 6.8   ALBUMIN 3.8 3.7   No results for input(s): LIPASE, AMYLASE in the last 168 hours. No results for input(s): AMMONIA in the last 168 hours. Coagulation Profile: No results for input(s): INR, PROTIME in the last 168 hours. Cardiac Enzymes: No results for input(s): CKTOTAL, CKMB, CKMBINDEX, TROPONINI in the last 168 hours. BNP (last 3 results) No results for input(s): PROBNP in the last 8760 hours. HbA1C: No results for input(s): HGBA1C in the last 72 hours. CBG: No results for input(s): GLUCAP in the last 168 hours. Lipid Profile: No results for input(s): CHOL, HDL, LDLCALC, TRIG, CHOLHDL, LDLDIRECT in the last 72 hours. Thyroid Function Tests: No results for input(s): TSH, T4TOTAL, FREET4, T3FREE, THYROIDAB in the last 72 hours. Anemia Panel: No results for input(s): VITAMINB12, FOLATE, FERRITIN, TIBC, IRON, RETICCTPCT in the last 72 hours. Urine analysis:    Component Value Date/Time   COLORURINE YELLOW 06/13/2019 1411   APPEARANCEUR CLOUDY (A) 06/13/2019 1411   LABSPEC 1.021 06/13/2019 1411   PHURINE 5.0 06/13/2019 1411   GLUCOSEU NEGATIVE 06/13/2019 1411   HGBUR NEGATIVE 06/13/2019 1411   BILIRUBINUR NEGATIVE 06/13/2019 1411   BILIRUBINUR neg 07/07/2017 1630   KETONESUR NEGATIVE 06/13/2019 1411   PROTEINUR NEGATIVE 06/13/2019 1411   UROBILINOGEN 0.2 07/07/2017 1630   UROBILINOGEN 0.2 03/28/2015 1236   NITRITE NEGATIVE 06/13/2019 1411   LEUKOCYTESUR LARGE (A) 06/13/2019 1411   Sepsis Labs: @LABRCNTIP (procalcitonin:4,lacticidven:4)  ) Recent Results (from the past 240 hour(s))  SARS Coronavirus 2 (CEPHEID- Performed in [place] hospital lab), Hosp Order     Status: None   Collection Time: 06/11/19 12:14 PM   Specimen: Nasopharyngeal Swab  Result Value Ref Range Status   SARS Coronavirus 2 NEGATIVE NEGATIVE Final    Comment: (NOTE) If result is NEGATIVE SARS-CoV-2 target nucleic acids are NOT DETECTED. The SARS-CoV-2 RNA is generally detectable in upper and lower   respiratory specimens during the acute phase of infection. The lowest  concentration of SARS-CoV-2 viral copies this assay can detect is 250  copies / mL. A negative result does not preclude SARS-CoV-2 infection  and should not be used as the sole basis for treatment or other  patient management decisions.  A negative result may occur with  improper specimen collection / handling, submission of specimen other  than nasopharyngeal swab, presence of viral mutation(s) within the  areas targeted by this assay, and inadequate number of viral copies  (<250 copies / mL). A negative result must be combined with clinical  observations, patient history, and epidemiological information. If result is POSITIVE SARS-CoV-2 target nucleic acids are DETECTED. The SARS-CoV-2 RNA is generally detectable in upper and lower  respiratory specimens dur ing the acute phase of infection.  Positive  results are indicative of active infection with SARS-CoV-2.  Clinical  correlation with patient history and other diagnostic information is  necessary to determine patient infection status.  Positive results do  not rule out bacterial infection or co-infection with other viruses. If result is PRESUMPTIVE POSTIVE SARS-CoV-2 nucleic acids MAY BE PRESENT.   A presumptive positive result was obtained on the submitted specimen  and confirmed on repeat testing.  While 2019 novel coronavirus  (SARS-CoV-2) nucleic acids may be present in the submitted sample  additional confirmatory testing may be necessary for epidemiological  and / or clinical management purposes  to differentiate between  SARS-CoV-2 and other Sarbecovirus currently known to infect humans.  If clinically indicated additional testing with an alternate test  methodology [phone]) is advised. The SARS-CoV-2 RNA is generally  detectable in upper and lower respiratory sp ecimens during the acute  phase of infection. The expected result is Negative. Fact  Sheet for Patients:  StrictlyIdeas.no Fact Sheet for Healthcare Providers: BankingDealers.co.za This test is not yet approved or cleared by the [place] FDA and has been authorized for detection and/or diagnosis of SARS-CoV-2 by FDA under an Emergency Use Authorization (EUA).  This EUA will remain in effect (meaning this test can be used) for the duration of the COVID-19 declaration under Section 564(b)(1) of the Act, 21 U.S.C. section 360bbb-3(b)(1), unless the authorization is terminated or revoked sooner. Performed at [facility], [place] [address], [place], [place] [address]   MRSA PCR Screening     Status: None   Collection Time: 06/13/19  4:16 AM   Specimen: Nasal Mucosa; Nasopharyngeal  Result Value Ref Range Status   MRSA by PCR NEGATIVE NEGATIVE Final    Comment:        The GeneXpert MRSA Assay (FDA approved for NASAL specimens only), is one component of a comprehensive MRSA colonization surveillance program. It is not intended to diagnose MRSA infection nor to guide or monitor treatment for MRSA infections. Performed at [facility], [place] [address], [place], [place] [address]       Studies: No results found.  Scheduled Meds: . [START ON 06/20/2019] aspirin EC  81 mg Oral Daily  . atorvastatin  80 mg Oral Daily  . Chlorhexidine Gluconate Cloth  6 each Topical Daily  . [START ON 06/20/2019] clopidogrel  75 mg Oral Q breakfast  . dicyclomine  20 mg Oral TID AC  . enoxaparin (LOVENOX) injection  40 mg Subcutaneous Q24H  . fluticasone furoate-vilanterol  1 puff Inhalation Daily  . furosemide  40 mg Oral Daily  . hydrALAZINE  50 mg Oral Q8H  . loratadine  10 mg Oral QHS  . nebivolol  20 mg Oral Daily  . neomycin-polymyxin b-dexamethasone  1 drop Left Eye BID  . pantoprazole  40 mg Oral BID  . potassium chloride  40 mEq Oral Daily  . sodium chloride flush  10-40 mL Intracatheter  Q12H  . sodium chloride  flush  3 mL Intravenous Q12H  . sodium chloride flush  3 mL Intravenous Q12H  . sodium chloride flush  3 mL Intravenous Q12H  . sodium chloride flush  3 mL Intravenous Q12H    Continuous Infusions: . sodium chloride 20 mL/hr at 06/19/19 1030  . sodium chloride 75 mL/hr at 06/19/19 1319  . sodium chloride    . nitroGLYCERIN 20 mcg/min (06/19/19 1322)     LOS: 7 days     Kayleen Memos, MD [facility] Pager [phone]  If 7PM-7AM, please contact night-coverage [email or web address] Password [email or web address] 06/19/2019, 1:52 PM

## 2019-06-19 NOTE — Interval H&P Note (Signed)
Cath Lab Visit (complete for each Cath Lab visit)  Clinical Evaluation Leading to the Procedure:   ACS: Yes.    Non-ACS:    Anginal Classification: CCS [name]  Anti-ischemic medical therapy: Maximal Therapy (2 or more classes of medications)  Non-Invasive Test Results: No non-invasive testing performed  Prior CABG: No previous CABG      History and Physical Interval Note:  06/19/2019 10:42 AM  Tracey Manning  has presented today for surgery, with the diagnosis of cad.  The various methods of treatment have been discussed with the patient and family. After consideration of risks, benefits and other options for treatment, the patient has consented to  Procedure(s): CORONARY STENT INTERVENTION (N/A) as a surgical intervention.  The patient's history has been reviewed, patient examined, no change in status, stable for surgery.  I have reviewed the patient's chart and labs.  Questions were answered to the patient's satisfaction.     Quay Burow

## 2019-06-19 NOTE — H&P (View-Only) (Signed)
Progress Note  Patient Name: Tracey Manning Date of Encounter: 06/19/2019  Primary Cardiologist: Quay Burow, MD   Subjective   Patient reports breathing is back to baseline. No complaints of recurrent chest pain or palpitations. Some HA with nitro gtt. She hopes to be home for her birthday on Thursday.  Inpatient Medications    Scheduled Meds: . amLODipine  10 mg Oral Daily  . aspirin EC  325 mg Oral Daily  . atorvastatin  40 mg Oral Daily  . Chlorhexidine Gluconate Cloth  6 each Topical Daily  . dicyclomine  20 mg Oral TID AC  . enoxaparin (LOVENOX) injection  40 mg Subcutaneous Q24H  . fluticasone furoate-vilanterol  1 puff Inhalation Daily  . furosemide  40 mg Oral Daily  . hydrALAZINE  25 mg Oral Q8H  . loratadine  10 mg Oral QHS  . nebivolol  20 mg Oral Daily  . neomycin-polymyxin b-dexamethasone  1 drop Left Eye BID  . pantoprazole  40 mg Oral BID  . potassium chloride  40 mEq Oral Daily  . sodium chloride flush  10-40 mL Intracatheter Q12H  . sodium chloride flush  3 mL Intravenous Q12H  . sodium chloride flush  3 mL Intravenous Q12H  . sodium chloride flush  3 mL Intravenous Q12H   Continuous Infusions: . sodium chloride 10 mL/hr at 06/18/19 0604  . sodium chloride    . sodium chloride 1 mL/kg/hr (06/19/19 0509)  . nitroGLYCERIN 10 mcg/min (06/19/19 0413)   PRN Meds: sodium chloride, sodium chloride, acetaminophen, albuterol, ALPRAZolam, hydrALAZINE, labetalol, LORazepam, morphine injection, nitroGLYCERIN, ondansetron (ZOFRAN) IV, oxyCODONE-acetaminophen **AND** oxyCODONE, sodium chloride flush, sodium chloride flush, sodium chloride flush, zolpidem   Vital Signs    Vitals:   06/18/19 1340 06/18/19 2039 06/18/19 2336 06/19/19 0419  BP: 114/78 114/72 110/83 121/78  Pulse: 64 65  62  Resp:      Temp: 98 F (36.7 C) 98.1 F (36.7 C)  97.9 F (36.6 C)  TempSrc: Oral Oral  Oral  SpO2: 98% 100%  98%  Weight:    91.9 kg  Height:        Intake/Output  Summary (Last 24 hours) at 06/19/2019 0746 Last data filed at 06/19/2019 0300 Gross per 24 hour  Intake 515.18 ml  Output 1200 ml  Net -684.82 ml   Filed Weights   06/17/19 0431 06/18/19 0558 06/19/19 0419  Weight: 90.4 kg 91.4 kg 91.9 kg    Telemetry    Sinus rhythm with brief NSVT (6 beats) and occasional sinus bradycardia - Personally Reviewed  Physical Exam   GEN: Sitting upright in bed in no acute distress.   Neck: No JVD, no carotid bruits Cardiac: RRR, no murmurs, rubs, or gallops.  Respiratory: Clear to auscultation bilaterally, no wheezes/ rales/ rhonchi GI: NABS, Soft, obese, nontender, non-distended  MS: No edema; No deformity. Neuro:  Nonfocal, moving all extremities spontaneously Psych: Normal affect   Labs    Chemistry Recent Labs  Lab 06/13/19 0816 06/14/19 0216  06/17/19 0421 06/18/19 0610 06/19/19 0432  NA 141 138   < > 137 140 140  K 4.4 4.5   < > 4.2 3.9 3.5  CL 103 99   < > 102 103 103  CO2 25 26   < > 26 26 24   GLUCOSE 133* 102*   < > 108* 118* 88  BUN 25* 41*   < > 21* 24* 19  CREATININE 1.37* 1.81*   < > 1.39* 1.33* 1.19*  CALCIUM 9.2 8.7*   < > 9.1 9.0 8.0*  PROT 7.1 6.8  --   --   --   --   ALBUMIN 3.8 3.7  --   --   --   --   AST 30 25  --   --   --   --   ALT 51* 46*  --   --   --   --   ALKPHOS 63 60  --   --   --   --   BILITOT 0.6 0.6  --   --   --   --   GFRNONAA 43* 30*   < > 42* 44* 51*  GFRAA 49* 35*   < > 49* 51* 59*  ANIONGAP 13 13   < > 9 11 13    < > = values in this interval not displayed.     Hematology Recent Labs  Lab 06/15/19 1630 06/16/19 0455 06/17/19 0421  WBC 9.1 12.4* 11.9*  RBC 4.56 4.19 4.09  HGB 13.1 12.0 11.7*  HCT 39.3 36.0 35.9*  MCV 86.2 85.9 87.8  MCH 28.7 28.6 28.6  MCHC 33.3 33.3 32.6  RDW 16.8* 16.3* 16.4*  PLT 203 177 182    Cardiac EnzymesNo results for input(s): TROPONINI in the last 168 hours. No results for input(s): TROPIPOC in the last 168 hours.   BNP Recent Labs  Lab 06/12/19  1411  BNP 600.7*     DDimer  Recent Labs  Lab 06/12/19 1411  DDIMER 0.72*     Radiology    No results found.  Cardiac Studies   Echocardiogram 06/12/2019: 1. The left ventricle has severely reduced systolic function, with an ejection fraction of 25-30%. The cavity size was moderately dilated. Left ventricular diastolic function could not be evaluated. Left ventricular diffuse hypokinesis.  2. The right ventricle has normal systolic function. The cavity was normal.  3. The mitral valve is grossly normal.  4. The aortic root is normal in size and structure.  5. The tricuspid valve is grossly normal.  6. The aortic valve is tricuspid. Mild thickening of the aortic valve. No stenosis of the aortic valve.  7. Severe global reduction in LV systolic function; moderate LVE; mild MR.  Right/Left Heart Catheterization 06/15/2019:  Acute on chronic combined systolic and diastolic heart failure, decompensated with severe elevation in LVEDP and mean pulmonary capillary wedge, both greater than 30 mmHg.  EF by echo is 25%.  Severe coronary artery disease with angulated high-grade stenosis in the proximal LAD that includes in-stent restenosis.  Widely patent left main  Moderate circumflex disease with 60 to 70% stenosis in the mid vessel after the first obtuse marginal followed by 85% stenosis in a large branch, 40% segmental stenosis in the proximal first obtuse marginal, focal 50% in-stent restenosis within overlapping stents in the third obtuse marginal.  50% segmental narrowing proximal to the third obtuse marginal stents.  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  RCA appeared to be codominant.  Left to right collaterals are noted.  RECOMMENDATIONS:   Heart failure therapy.  40 mg of IV furosemide was given postprocedure  Watch kidney function closely as she is at risk for acute kidney injury.  Revascularization should be  considered.  Surgical versus percutaneous will be the debate.  This cannot be done until heart failure is much better compensated.  Also of question is whether anterior wall is viable.  Patient Profile  58 y.o.femalewith PMH of CAD, tobacco abuse, HTN, and HLD presented with SOB/asthma and found to have new cardiomyopathy. Cath 7/24 showed occluded RCA with severe stenosis of OM and prox LAD. COVID 19 negative.  Assessment & Plan    1. Acute combined CHF: patient presented with SOB. Found to have new cardiomyopathy. Echo with EF 25-30%, moderate LV dilation, diffuse hypokinesis, and mild MR. She underwent a R/LHC which showed severe multivessel disease, though not a candidate for CABG due to comorbidities, ongoing tobacco use, poor functional status, and non-compliance. She has been diuresing with IV>po lasix with UOP net -629mL in the past 24 hours and -3.6L this admission. Weight 210lbs on admission>202lbs today. Cr improved to 1.19 - Continue bystolic - Will stop amlodipine given low EF - Will increase hydralazine to 50mg  TID - Continue po lasix   - Could consider addition of ARB vs Entresto if Cr stable after staged coronary intervention.   2. CAD s/p PCI/DES to LAD in 2012: patient with chest pain this admission with trop peak of 33 and non-ischemic EKG. She was found to have new drop in EF to 25-30%. R/LHC 06/16/2019 with severe pLAD in-stent restenosis, moderate LCx stenosis, severe 1st OM stenosis, and a totally occluded RCA with L>R collaterals. She was recommended for CABG consideration, however was felt to be a poor candidate given comorbidities, ongoing tobacco use, poor functional status, and non-compliance. Patient was diuresed in preparation for coronary artery intervention.  - Plan for [facility] today for possible percutaneous coronary intervention - Continue aspirin and statin - Continue nitro gtt for now  3. HTN: BP improved.  - Continue bystolic and hydralazine - Will stop  amlodipine with plans to uptitrate hydralazine as above - Could consider adding ARB vs Entresto if Cr stable post intervention   4. HLD: LDL 131 this admission - Will increase atorvastatin to 80mg  daily  5. COPD: On 2L O2 via [place] at baseline.  - Continue management per primary team  6. CKD stage 3: Cr peaked at 1.39, improved to 1.19 today (baseline ~1.2).  - Continue to monitor closely   For questions or updates, please contact [place] Please consult [email or web address] for contact info under Cardiology/STEMI.      Signed, Abigail Butts, PA-C  06/19/2019, 7:46 AM   [phone]  I have seen and examined the patient along with Abigail Butts, PA-C .  I have reviewed the chart, notes and new data.  I agree with PA's note.  Key new complaints: no angina; lying fully supine without dyspnea Key examination changes: nods off during conversation (did not have CPAP last night), obese, no overt hypervolemia Key new findings / data: improved creatinine, back to baseline 1.2  PLAN: Elective PCI to proximal LAD (+/- ostial OM?) later today w Dr. Gwenlyn Found. This procedure has been fully reviewed with the patient and written informed consent has been obtained.   Sanda Klein, MD, [place] [phone] 06/19/2019, 9:51 AM

## 2019-06-19 NOTE — Progress Notes (Addendum)
Progress Note  Patient Name: Tracey Manning Date of Encounter: 06/19/2019  Primary Cardiologist: Quay Burow, MD   Subjective   Patient reports breathing is back to baseline. No complaints of recurrent chest pain or palpitations. Some HA with nitro gtt. She hopes to be home for her [birth year] on Thursday.  Inpatient Medications    Scheduled Meds: . amLODipine  10 mg Oral Daily  . aspirin EC  325 mg Oral Daily  . atorvastatin  40 mg Oral Daily  . Chlorhexidine Gluconate Cloth  6 each Topical Daily  . dicyclomine  20 mg Oral TID AC  . enoxaparin (LOVENOX) injection  40 mg Subcutaneous Q24H  . fluticasone furoate-vilanterol  1 puff Inhalation Daily  . furosemide  40 mg Oral Daily  . hydrALAZINE  25 mg Oral Q8H  . loratadine  10 mg Oral QHS  . nebivolol  20 mg Oral Daily  . neomycin-polymyxin b-dexamethasone  1 drop Left Eye BID  . pantoprazole  40 mg Oral BID  . potassium chloride  40 mEq Oral Daily  . sodium chloride flush  10-40 mL Intracatheter Q12H  . sodium chloride flush  3 mL Intravenous Q12H  . sodium chloride flush  3 mL Intravenous Q12H  . sodium chloride flush  3 mL Intravenous Q12H   Continuous Infusions: . sodium chloride 10 mL/hr at 06/18/19 0604  . sodium chloride    . sodium chloride 1 mL/kg/hr (06/19/19 0509)  . nitroGLYCERIN 10 mcg/min (06/19/19 0413)   PRN Meds: sodium chloride, sodium chloride, acetaminophen, albuterol, ALPRAZolam, hydrALAZINE, labetalol, LORazepam, morphine injection, nitroGLYCERIN, ondansetron (ZOFRAN) IV, oxyCODONE-acetaminophen **AND** oxyCODONE, sodium chloride flush, sodium chloride flush, sodium chloride flush, zolpidem   Vital Signs    Vitals:   06/18/19 1340 06/18/19 2039 06/18/19 2336 06/19/19 0419  BP: 114/78 114/72 110/83 121/78  Pulse: 64 65  62  Resp:      Temp: 98 F (36.7 C) 98.1 F (36.7 C)  97.9 F (36.6 C)  TempSrc: Oral Oral  Oral  SpO2: 98% 100%  98%  Weight:    91.9 kg  Height:        Intake/Output  Summary (Last 24 hours) at 06/19/2019 0746 Last data filed at 06/19/2019 0300 Gross per 24 hour  Intake 515.18 ml  Output 1200 ml  Net -684.82 ml   Filed Weights   06/17/19 0431 06/18/19 0558 06/19/19 0419  Weight: 90.4 kg 91.4 kg 91.9 kg    Telemetry    Sinus rhythm with brief NSVT (6 beats) and occasional sinus bradycardia - Personally Reviewed  Physical Exam   GEN: Sitting upright in bed in no acute distress.   Neck: No JVD, no carotid bruits Cardiac: RRR, no murmurs, rubs, or gallops.  Respiratory: Clear to auscultation bilaterally, no wheezes/ rales/ rhonchi GI: NABS, Soft, obese, nontender, non-distended  MS: No edema; No deformity. Neuro:  Nonfocal, moving all extremities spontaneously Psych: Normal affect   Labs    Chemistry Recent Labs  Lab 06/13/19 0816 06/14/19 0216  06/17/19 0421 06/18/19 0610 06/19/19 0432  NA 141 138   < > 137 140 140  K 4.4 4.5   < > 4.2 3.9 3.5  CL 103 99   < > 102 103 103  CO2 25 26   < > 26 26 24   GLUCOSE 133* 102*   < > 108* 118* 88  BUN 25* 41*   < > 21* 24* 19  CREATININE 1.37* 1.81*   < > 1.39* 1.33* 1.19*  CALCIUM 9.2 8.7*   < > 9.1 9.0 8.0*  PROT 7.1 6.8  --   --   --   --   ALBUMIN 3.8 3.7  --   --   --   --   AST 30 25  --   --   --   --   ALT 51* 46*  --   --   --   --   ALKPHOS 63 60  --   --   --   --   BILITOT 0.6 0.6  --   --   --   --   GFRNONAA 43* 30*   < > 42* 44* 51*  GFRAA 49* 35*   < > 49* 51* 59*  ANIONGAP 13 13   < > 9 11 13    < > = values in this interval not displayed.     Hematology Recent Labs  Lab 06/15/19 1630 06/16/19 0455 06/17/19 0421  WBC 9.1 12.4* 11.9*  RBC 4.56 4.19 4.09  HGB 13.1 12.0 11.7*  HCT 39.3 36.0 35.9*  MCV 86.2 85.9 87.8  MCH 28.7 28.6 28.6  MCHC 33.3 33.3 32.6  RDW 16.8* 16.3* 16.4*  PLT 203 177 182    Cardiac EnzymesNo results for input(s): TROPONINI in the last 168 hours. No results for input(s): TROPIPOC in the last 168 hours.   BNP Recent Labs  Lab 06/12/19  1411  BNP 600.7*     DDimer  Recent Labs  Lab 06/12/19 1411  DDIMER 0.72*     Radiology    No results found.  Cardiac Studies   Echocardiogram 06/12/2019: 1. The left ventricle has severely reduced systolic function, with an ejection fraction of 25-30%. The cavity size was moderately dilated. Left ventricular diastolic function could not be evaluated. Left ventricular diffuse hypokinesis.  2. The right ventricle has normal systolic function. The cavity was normal.  3. The mitral valve is grossly normal.  4. The aortic root is normal in size and structure.  5. The tricuspid valve is grossly normal.  6. The aortic valve is tricuspid. Mild thickening of the aortic valve. No stenosis of the aortic valve.  7. Severe global reduction in LV systolic function; moderate LVE; mild MR.  Right/Left Heart Catheterization 06/15/2019:  Acute on chronic combined systolic and diastolic heart failure, decompensated with severe elevation in LVEDP and mean pulmonary capillary wedge, both greater than 30 mmHg.  EF by echo is 25%.  Severe coronary artery disease with angulated high-grade stenosis in the proximal LAD that includes in-stent restenosis.  Widely patent left main  Moderate circumflex disease with 60 to 70% stenosis in the mid vessel after the first obtuse marginal followed by 85% stenosis in a large branch, 40% segmental stenosis in the proximal first obtuse marginal, focal 50% in-stent restenosis within overlapping stents in the third obtuse marginal.  50% segmental narrowing proximal to the third obtuse marginal stents.  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  RCA appeared to be codominant.  Left to right collaterals are noted.  RECOMMENDATIONS:   Heart failure therapy.  40 mg of IV furosemide was given postprocedure  Watch kidney function closely as she is at risk for acute kidney injury.  Revascularization should be  considered.  Surgical versus percutaneous will be the debate.  This cannot be done until heart failure is much better compensated.  Also of question is whether anterior wall is viable.  Patient Profile  58 y.o.femalewith PMH of CAD, tobacco abuse, HTN, and HLD presented with SOB/asthma and found to have new cardiomyopathy. Cath 7/24 showed occluded RCA with severe stenosis of OM and prox LAD. COVID 19 negative.  Assessment & Plan    1. Acute combined CHF: patient presented with SOB. Found to have new cardiomyopathy. Echo with EF 25-30%, moderate LV dilation, diffuse hypokinesis, and mild MR. She underwent a R/LHC which showed severe multivessel disease, though not a candidate for CABG due to comorbidities, ongoing tobacco use, poor functional status, and non-compliance. She has been diuresing with IV>po lasix with UOP net -673mL in the past 24 hours and -3.6L this admission. Weight 210lbs on admission>202lbs today. Cr improved to 1.19 - Continue bystolic - Will stop amlodipine given low EF - Will increase hydralazine to 50mg  TID - Continue po lasix   - Could consider addition of ARB vs Entresto if Cr stable after staged coronary intervention.   2. CAD s/p PCI/DES to LAD in 2012: patient with chest pain this admission with trop peak of 33 and non-ischemic EKG. She was found to have new drop in EF to 25-30%. R/LHC 06/16/2019 with severe pLAD in-stent restenosis, moderate LCx stenosis, severe 1st OM stenosis, and a totally occluded RCA with L>R collaterals. She was recommended for CABG consideration, however was felt to be a poor candidate given comorbidities, ongoing tobacco use, poor functional status, and non-compliance. Patient was diuresed in preparation for coronary artery intervention.  - Plan for [facility] today for possible percutaneous coronary intervention - Continue aspirin and statin - Continue nitro gtt for now  3. HTN: BP improved.  - Continue bystolic and hydralazine - Will stop  amlodipine with plans to uptitrate hydralazine as above - Could consider adding ARB vs Entresto if Cr stable post intervention   4. HLD: LDL 131 this admission - Will increase atorvastatin to 80mg  daily  5. COPD: On 2L O2 via [place] at baseline.  - Continue management per primary team  6. CKD stage 3: Cr peaked at 1.39, improved to 1.19 today (baseline ~1.2).  - Continue to monitor closely   For questions or updates, please contact [place] Please consult [email or web address] for contact info under Cardiology/STEMI.      Signed, Abigail Butts, PA-C  06/19/2019, 7:46 AM   [phone]  I have seen and examined the patient along with Abigail Butts, PA-C .  I have reviewed the chart, notes and new data.  I agree with PA's note.  Key new complaints: no angina; lying fully supine without dyspnea Key examination changes: nods off during conversation (did not have CPAP last night), obese, no overt hypervolemia Key new findings / data: improved creatinine, back to baseline 1.2  PLAN: Elective PCI to proximal LAD (+/- ostial OM?) later today w Dr. Gwenlyn Found. This procedure has been fully reviewed with the patient and written informed consent has been obtained.   Sanda Klein, MD, [place] [phone] 06/19/2019, 9:51 AM

## 2019-06-20 ENCOUNTER — Other Ambulatory Visit: Payer: Self-pay

## 2019-06-20 ENCOUNTER — Telehealth: Payer: Self-pay | Admitting: Cardiovascular Disease

## 2019-06-20 ENCOUNTER — Telehealth: Payer: Self-pay | Admitting: *Deleted

## 2019-06-20 DIAGNOSIS — E7800 Pure hypercholesterolemia, unspecified: Secondary | ICD-10-CM

## 2019-06-20 DIAGNOSIS — I25110 Atherosclerotic heart disease of native coronary artery with unstable angina pectoris: Secondary | ICD-10-CM

## 2019-06-20 LAB — CBC
HCT: 37.6 % (ref 36.0–46.0)
Hemoglobin: 12.5 g/dL (ref 12.0–15.0)
MCH: 28.6 pg (ref 26.0–34.0)
MCHC: 33.2 g/dL (ref 30.0–36.0)
MCV: 86 fL (ref 80.0–100.0)
Platelets: 280 10*3/uL (ref 150–400)
RBC: 4.37 MIL/uL (ref 3.87–5.11)
RDW: 16.1 % — ABNORMAL HIGH (ref 11.5–15.5)
WBC: 8.8 10*3/uL (ref 4.0–10.5)
nRBC: 0 % (ref 0.0–0.2)

## 2019-06-20 LAB — BASIC METABOLIC PANEL
Anion gap: 13 (ref 5–15)
BUN: 19 mg/dL (ref 6–20)
CO2: 29 mmol/L (ref 22–32)
Calcium: 9.1 mg/dL (ref 8.9–10.3)
Chloride: 96 mmol/L — ABNORMAL LOW (ref 98–111)
Creatinine, Ser: 1.23 mg/dL — ABNORMAL HIGH (ref 0.44–1.00)
GFR calc Af Amer: 56 mL/min — ABNORMAL LOW (ref >60)
GFR calc non Af Amer: 49 mL/min — ABNORMAL LOW (ref >60)
Glucose, Bld: 134 mg/dL — ABNORMAL HIGH (ref 70–99)
Potassium: 3.7 mmol/L (ref 3.5–5.1)
Sodium: 138 mmol/L (ref 135–145)

## 2019-06-20 MED ORDER — FUROSEMIDE 40 MG PO TABS: 40.0000 mg | ORAL_TABLET | Freq: Every day | ORAL | 0 refills | Status: DC

## 2019-06-20 MED ORDER — POTASSIUM CHLORIDE CRYS ER 20 MEQ PO TBCR: 40.0000 meq | EXTENDED_RELEASE_TABLET | Freq: Every day | ORAL | 0 refills | Status: DC

## 2019-06-20 MED ORDER — ATORVASTATIN CALCIUM 80 MG PO TABS: 80.0000 mg | ORAL_TABLET | Freq: Every day | ORAL | 0 refills | Status: DC

## 2019-06-20 MED ORDER — BYSTOLIC 20 MG PO TABS: 20.0000 mg | ORAL_TABLET | Freq: Every day | ORAL | 0 refills | Status: DC

## 2019-06-20 MED ORDER — HYDRALAZINE HCL 50 MG PO TABS: 50.0000 mg | ORAL_TABLET | Freq: Three times a day (TID) | ORAL | 1 refills | Status: DC

## 2019-06-20 MED ORDER — OXYCODONE HCL 5 MG PO TABS: 5.0000 mg | ORAL_TABLET | Freq: Every day | ORAL | 0 refills | Status: DC | PRN

## 2019-06-20 MED ORDER — ISOSORBIDE MONONITRATE ER 30 MG PO TB24
30.0000 mg | ORAL_TABLET | Freq: Every day | ORAL | Status: DC
  Administered 2019-06-20: 10:00:00 30 mg via ORAL
  Filled 2019-06-20: qty 1

## 2019-06-20 MED ORDER — LOSARTAN POTASSIUM 50 MG PO TABS: 50.0000 mg | ORAL_TABLET | Freq: Every day | ORAL | 0 refills | Status: DC

## 2019-06-20 MED ORDER — ISOSORBIDE MONONITRATE ER 30 MG PO TB24: 30.0000 mg | ORAL_TABLET | Freq: Every day | ORAL | 1 refills | Status: DC

## 2019-06-20 MED ORDER — LOSARTAN POTASSIUM 50 MG PO TABS
50.0000 mg | ORAL_TABLET | Freq: Every day | ORAL | Status: DC
  Administered 2019-06-20: 10:00:00 50 mg via ORAL
  Filled 2019-06-20: qty 1

## 2019-06-20 MED ORDER — ASPIRIN 81 MG PO TBEC: 81.0000 mg | DELAYED_RELEASE_TABLET | Freq: Every day | ORAL | 1 refills | Status: DC

## 2019-06-20 MED ORDER — CLOPIDOGREL BISULFATE 75 MG PO TABS: 75.0000 mg | ORAL_TABLET | Freq: Every day | ORAL | 1 refills | Status: DC

## 2019-06-20 NOTE — Telephone Encounter (Signed)
   Left message to call back    Jenkins Rouge at 06/20/2019 8:40 AM  Status: Signed    New Message   Per PA Daleen Snook [employer] scheduled [facility] visit with PA Kerin Ransom on 06/28/2019 at 8:30am

## 2019-06-20 NOTE — Telephone Encounter (Signed)
Sent to [facility]

## 2019-06-20 NOTE — Progress Notes (Signed)
PROGRESS NOTE  Tracey Manning ALP:[id number] DOB: 1961/11/21 DOA: 06/11/2019 PCP: Nolene Ebbs, MD  HPI/Recap of past 24 hours: Tracey Hendersonis a 58 y.o.femalewith medical history significant ofCAD with multiple stents, HTN, HLD, COPD, anxiety presenting with shortness of breath and nonspecific chest pain.  She was admitted for shortness of breath and nonspecific chest pain.  Developed worsening SOB and orthopnea on hospital day 1 requiring transfer to stepdown.  Found to have HF exacerbation with low EF and imaging with edema.  Cardiology was consulted -planning L and R heart cath.  Transferred to [facility] on 06/15/2019 where she had left and right heart cath. Cath on 7/24 showed occluded RCA with collaterals tight OM and stenosis proximal to stent in mid LAD.  Had moderate pulmonary HTN and PCWP 37 mmHg.  Hospital course complicated by persistent chest discomfort. Started on nitro drip per cardiology.  PCI on 06/19/19.  status post PCI-drug-eluting stent x2 to the ostial-proximal LAD artery.  06/20/19: Patient was seen and examined at bedside this morning.  Reports chest pain overnight relieved by nitro.  At the time of this visit she is chest pain-free.     Assessment/Plan: Principal Problem:   Dyspnea on exertion Active Problems:   Essential hypertension   Hyperlipidemia   Coronary artery disease   Chest pain   Acute on chronic diastolic HF (heart failure) (HCC)   AKI (acute kidney injury) (HCC)   CHF (congestive heart failure) (HCC)  Newly diagnosed cardiomyopathy/acute systolic CHF/status post PCI drug-eluting stent x2 to the ostial proximal LAD artery.   Post left and right heart cath which showed occluded RCA with collaterals tight OM and stenosis proximal to stent in mid LAD.  Had moderate pulmonary HTN and PCWP 37 mmHg Per cardiology not a good candidate for CABG due to COPD, ongoing tobacco use, history of noncompliance, and poor functional status. Last 2D echo done  during this admission revealed LVEF 25 to 30% Continue cardiac medications as recommended by cardiology Continue strict I's and O's and daily weight Net I&O -2.1L since admission Continue p.o. Lasix 40 mg daily  Status post PCI-drug-eluting stent x2 to the ostial-proximal LAD artery. Continue aspirin, Plavix and Lipitor 80 mg daily  Resolved uncontrolled hypertension Nitro drip stopped this morning Blood pressure at goal Continue PO antihypertensives  Intermittent chest pain Post PCI Closely monitor on telemetry  CKD 3 Baseline creatinine around 1.2 with GFR 54>> cr.1.39>> 1.33>>1.19>> 1.23 Creatinine is back to baseline 1.19 with GFR of 59 Continue to avoid nephrotoxins Monitor urine output Continue daily BMP  Hypokalemia, repleted On maintenance potassium supplement 40 mEq daily Repeat BMP in the morning  Pulmonary edema likely cardiogenic, improving Independently viewed chest x-ray done on admission which showed cardiomegaly with increase in pulmonary vascularity Repeated chest x-ray done on 06/16/2019, reviewed personally shows increased markings in upper lobes greater on the right Lactic acid and procalcitonin negative Maintain O2 saturation greater than 92% On p.o. Lasix, 40 mg daily O2 saturation 97% on room air.   Chest discomfort Post heart cath on 06/16/2019 with findings as stated above Nitro drip per cardiology  Coronary artery disease Continue Lipitor and aspirin Not a good candidate for CABG per cardiology due to uncontrolled COPD, ongoing tobacco use with poor functional status Post PCI 06/19/19 with drug-eluting stent  Acute on chronic hypoxic respiratory failure likely secondary to acute systolic CHF with pulmonary edema in the setting of COPD and ongoing tobacco use Management as stated above  COPD on 2 L nasal  cannula continuously at baseline started 2 years ago, noncompliant with oxygen therapy at home Maintain O2 saturation greater than 92%  Continue bronchodilators  Resolved leukocytosis, likely reactive post heart cath WBC 12 K >> 11.9K>> 8.8 Afebrile No sign of active infective process Procalcitonin and lactic acid negative on 06/17/2019  Hyperlipidemia Continue Lipitor  DVT prophylaxis: lovenox. Code Status: full  Family Communication discussed with the patient in detail  Disposition Plan:  Possible discharge to home in 1-2 days when cardiology signs off.  Consultants:   Cardiology  Procedures:  Echo IMPRESSIONS   1. The left ventricle has severely reduced systolic function, with an ejection fraction of 25-30%. The cavity size was moderately dilated. Left ventricular diastolic function could not be evaluated. Left ventricular diffuse hypokinesis. 2. The right ventricle has normal systolic function. The cavity was normal. 3. The mitral valve is grossly normal. 4. The aortic root is normal in size and structure. 5. The tricuspid valve is grossly normal. 6. The aortic valve is tricuspid. Mild thickening of the aortic valve. No stenosis of the aortic valve. 7. Severe global reduction in LV systolic function; moderate LVE; mild MR.  LE [place] Summary: Right: There is no evidence of deep vein thrombosis in the lower extremity. No cystic structure found in the popliteal fossa. Left: There is no evidence of deep vein thrombosis in the lower extremity. No cystic structure found in the popliteal fossa.     Objective: Vitals:   06/20/19 1000 06/20/19 1100 06/20/19 1129 06/20/19 1315  BP: (!) 157/69 (!) 148/81  132/72  Pulse: 61 66    Resp: (!) 21 (!) 22    Temp:   97.7 F (36.5 C)   TempSrc:   Oral   SpO2: 97% 96%    Weight:      Height:        Intake/Output Summary (Last 24 hours) at 06/20/2019 1510 Last data filed at 06/20/2019 0800 Gross per 24 hour  Intake 1932.31 ml  Output 1200 ml  Net 732.31 ml   Filed Weights   06/18/19 0558 06/19/19 0419 06/20/19 0500  Weight: 91.4 kg 91.9 kg 90.4  kg    Exam:  . General: 58 y.o. year-old female well-developed well-nourished in no acute distress.  Alert and oriented x3.   . Cardiovascular: Regular rate and rhythm no rubs or gallops no JVD or thyromegaly.   Marland Kitchen Respiratory: Clear to auscultation no wheezing no rales.  Poor inspiratory effort.   . Abdomen: Soft nontender nondistended normal bowel sounds present. . Musculoskeletal: No lower extremity edema.  2 out of 4 pulses in all 4 extremities.   Marland Kitchen Psychiatry: Mood is appropriate for condition and setting.   Data Reviewed: CBC: Recent Labs  Lab 06/15/19 0501  06/15/19 1046 06/15/19 1630 06/16/19 0455 06/17/19 0421 06/20/19 0411  WBC 11.1*  --   --  9.1 12.4* 11.9* 8.8  HGB 12.5   < > 11.9* 13.1 12.0 11.7* 12.5  HCT 39.4   < > 35.0* 39.3 36.0 35.9* 37.6  MCV 88.5  --   --  86.2 85.9 87.8 86.0  PLT 195  --   --  203 177 182 280   < > = values in this interval not displayed.   Basic Metabolic Panel: Recent Labs  Lab 06/14/19 0216  06/16/19 0455 06/17/19 0421 06/18/19 0610 06/19/19 0432 06/20/19 0411  NA 138   < > 138 137 140 140 138  K 4.5   < > 4.1 4.2 3.9 3.5  3.7  CL 99   < > 101 102 103 103 96*  CO2 26   < > 27 26 26 24 29   GLUCOSE 102*   < > 127* 108* 118* 88 134*  BUN 41*   < > 17 21* 24* 19 19  CREATININE 1.81*   < > 1.27* 1.39* 1.33* 1.19* 1.23*  CALCIUM 8.7*   < > 9.0 9.1 9.0 8.0* 9.1  MG 2.2  --   --   --   --   --   --    < > = values in this interval not displayed.   GFR: Estimated Creatinine Clearance: 51.6 mL/min (A) (by C-G formula based on SCr of 1.23 mg/dL (H)). Liver Function Tests: Recent Labs  Lab 06/14/19 0216  AST 25  ALT 46*  ALKPHOS 60  BILITOT 0.6  PROT 6.8  ALBUMIN 3.7   No results for input(s): LIPASE, AMYLASE in the last 168 hours. No results for input(s): AMMONIA in the last 168 hours. Coagulation Profile: No results for input(s): INR, PROTIME in the last 168 hours. Cardiac Enzymes: No results for input(s): CKTOTAL,  CKMB, CKMBINDEX, TROPONINI in the last 168 hours. BNP (last 3 results) No results for input(s): PROBNP in the last 8760 hours. HbA1C: No results for input(s): HGBA1C in the last 72 hours. CBG: No results for input(s): GLUCAP in the last 168 hours. Lipid Profile: No results for input(s): CHOL, HDL, LDLCALC, TRIG, CHOLHDL, LDLDIRECT in the last 72 hours. Thyroid Function Tests: No results for input(s): TSH, T4TOTAL, FREET4, T3FREE, THYROIDAB in the last 72 hours. Anemia Panel: No results for input(s): VITAMINB12, FOLATE, FERRITIN, TIBC, IRON, RETICCTPCT in the last 72 hours. Urine analysis:    Component Value Date/Time   COLORURINE YELLOW 06/13/2019 1411   APPEARANCEUR CLOUDY (A) 06/13/2019 1411   LABSPEC 1.021 06/13/2019 1411   PHURINE 5.0 06/13/2019 1411   GLUCOSEU NEGATIVE 06/13/2019 1411   HGBUR NEGATIVE 06/13/2019 1411   BILIRUBINUR NEGATIVE 06/13/2019 1411   BILIRUBINUR neg 07/07/2017 1630   KETONESUR NEGATIVE 06/13/2019 1411   PROTEINUR NEGATIVE 06/13/2019 1411   UROBILINOGEN 0.2 07/07/2017 1630   UROBILINOGEN 0.2 03/28/2015 1236   NITRITE NEGATIVE 06/13/2019 1411   LEUKOCYTESUR LARGE (A) 06/13/2019 1411   Sepsis Labs: @LABRCNTIP (procalcitonin:4,lacticidven:4)  ) Recent Results (from the past 240 hour(s))  SARS Coronavirus 2 (CEPHEID- Performed in [place] hospital lab), Hosp Order     Status: None   Collection Time: 06/11/19 12:14 PM   Specimen: Nasopharyngeal Swab  Result Value Ref Range Status   SARS Coronavirus 2 NEGATIVE NEGATIVE Final    Comment: (NOTE) If result is NEGATIVE SARS-CoV-2 target nucleic acids are NOT DETECTED. The SARS-CoV-2 RNA is generally detectable in upper and lower  respiratory specimens during the acute phase of infection. The lowest  concentration of SARS-CoV-2 viral copies this assay can detect is 250  copies / mL. A negative result does not preclude SARS-CoV-2 infection  and should not be used as the sole basis for treatment or  other  patient management decisions.  A negative result may occur with  improper specimen collection / handling, submission of specimen other  than nasopharyngeal swab, presence of viral mutation(s) within the  areas targeted by this assay, and inadequate number of viral copies  (<250 copies / mL). A negative result must be combined with clinical  observations, patient history, and epidemiological information. If result is POSITIVE SARS-CoV-2 target nucleic acids are DETECTED. The SARS-CoV-2 RNA is generally detectable in upper  and lower  respiratory specimens dur ing the acute phase of infection.  Positive  results are indicative of active infection with SARS-CoV-2.  Clinical  correlation with patient history and other diagnostic information is  necessary to determine patient infection status.  Positive results do  not rule out bacterial infection or co-infection with other viruses. If result is PRESUMPTIVE POSTIVE SARS-CoV-2 nucleic acids MAY BE PRESENT.   A presumptive positive result was obtained on the submitted specimen  and confirmed on repeat testing.  While 2019 novel coronavirus  (SARS-CoV-2) nucleic acids may be present in the submitted sample  additional confirmatory testing may be necessary for epidemiological  and / or clinical management purposes  to differentiate between  SARS-CoV-2 and other Sarbecovirus currently known to infect humans.  If clinically indicated additional testing with an alternate test  methodology [phone]) is advised. The SARS-CoV-2 RNA is generally  detectable in upper and lower respiratory sp ecimens during the acute  phase of infection. The expected result is Negative. Fact Sheet for Patients:  StrictlyIdeas.no Fact Sheet for Healthcare Providers: BankingDealers.co.za This test is not yet approved or cleared by the [place] FDA and has been authorized for detection and/or diagnosis of  SARS-CoV-2 by FDA under an Emergency Use Authorization (EUA).  This EUA will remain in effect (meaning this test can be used) for the duration of the COVID-19 declaration under Section 564(b)(1) of the Act, 21 U.S.C. section 360bbb-3(b)(1), unless the authorization is terminated or revoked sooner. Performed at [facility], [place] [address] [place], [place] [address]   MRSA PCR Screening     Status: None   Collection Time: 06/13/19  4:16 AM   Specimen: Nasal Mucosa; Nasopharyngeal  Result Value Ref Range Status   MRSA by PCR NEGATIVE NEGATIVE Final    Comment:        The GeneXpert MRSA Assay (FDA approved for NASAL specimens only), is one component of a comprehensive MRSA colonization surveillance program. It is not intended to diagnose MRSA infection nor to guide or monitor treatment for MRSA infections. Performed at [facility], [place] [address] [place], [place] [address]       Studies: No results found.  Scheduled Meds: . aspirin EC  81 mg Oral Daily  . atorvastatin  80 mg Oral Daily  . Chlorhexidine Gluconate Cloth  6 each Topical Daily  . clopidogrel  75 mg Oral Q breakfast  . dicyclomine  20 mg Oral TID AC  . enoxaparin (LOVENOX) injection  40 mg Subcutaneous Q24H  . fluticasone furoate-vilanterol  1 puff Inhalation Daily  . furosemide  40 mg Oral Daily  . hydrALAZINE  50 mg Oral Q8H  . isosorbide mononitrate  30 mg Oral Daily  . loratadine  10 mg Oral QHS  . losartan  50 mg Oral Daily  . nebivolol  20 mg Oral Daily  . neomycin-polymyxin b-dexamethasone  1 drop Left Eye BID  . pantoprazole  40 mg Oral BID  . potassium chloride  40 mEq Oral Daily  . sodium chloride flush  10-40 mL Intracatheter Q12H    Continuous Infusions: . sodium chloride 20 mL/hr at 06/19/19 1030  . sodium chloride    . nitroGLYCERIN Stopped (06/20/19 0932)     LOS: 8 days     Kayleen Memos, MD [facility] Pager [phone]  If  7PM-7AM, please contact night-coverage [email or web address] Password [email or web address] 06/20/2019, 3:10 PM

## 2019-06-20 NOTE — Progress Notes (Signed)
CARDIAC REHAB PHASE I   PRE:  Rate/Rhythm: 28 SR  BP:  Sitting: 141/101      SaO2: 98 RA  MODE:  Ambulation: 370 ft   POST:  Rate/Rhythm: 75 SR  BP:  Sitting: 133/75    SaO2: 97 RA   Pt ambulated 340ft in hallway standby assist with steady gait. Pt denies CP or SOB. Pt educated on importance of ASA, Plavix, and NTG. Pt given heart healthy and low sodium diets. Reviewed HF with pt, pt states she does not own a scale, and cannot afford one to weigh herself daily. Did review signs and symptoms of HF including swelling, orthopnea, and fatigue with pt. Reviewed restrictions, site care, and exercise guidelines. Will refer to CRP II [place], pt not interested in Virtual Cardiac Rehab at this time. Pt hopeful for d/c today.  [phone] Rufina Falco, RN BSN 06/20/2019 2:37 PM

## 2019-06-20 NOTE — Progress Notes (Signed)
Progress Note  Patient Name: Tracey Manning Date of Encounter: 06/20/2019  Primary Cardiologist: Quay Burow, MD   Subjective   LAD PCI was a little more challenging than anticipated, but was performed without complications. Had some chest discomfort around 2 AM and blood pressure has been quite high overnight and this morning.  Getting ready to receive morning dose of beta-blocker and hydralazine. Denies dyspnea.  No evidence of bleeding at radial access site, which is only slightly tender.  Inpatient Medications    Scheduled Meds: . aspirin EC  81 mg Oral Daily  . atorvastatin  80 mg Oral Daily  . Chlorhexidine Gluconate Cloth  6 each Topical Daily  . clopidogrel  75 mg Oral Q breakfast  . dicyclomine  20 mg Oral TID AC  . enoxaparin (LOVENOX) injection  40 mg Subcutaneous Q24H  . fluticasone furoate-vilanterol  1 puff Inhalation Daily  . furosemide  40 mg Oral Daily  . hydrALAZINE  50 mg Oral Q8H  . loratadine  10 mg Oral QHS  . nebivolol  20 mg Oral Daily  . neomycin-polymyxin b-dexamethasone  1 drop Left Eye BID  . pantoprazole  40 mg Oral BID  . potassium chloride  40 mEq Oral Daily  . sodium chloride flush  10-40 mL Intracatheter Q12H  . sodium chloride flush  3 mL Intravenous Q12H  . sodium chloride flush  3 mL Intravenous Q12H  . sodium chloride flush  3 mL Intravenous Q12H  . sodium chloride flush  3 mL Intravenous Q12H   Continuous Infusions: . sodium chloride 20 mL/hr at 06/19/19 1030  . sodium chloride    . nitroGLYCERIN 15 mcg/min (06/20/19 0600)   PRN Meds: sodium chloride, sodium chloride, acetaminophen, albuterol, ALPRAZolam, hydrALAZINE, labetalol, LORazepam, morphine injection, nitroGLYCERIN, ondansetron (ZOFRAN) IV, oxyCODONE-acetaminophen **AND** oxyCODONE, sodium chloride flush, sodium chloride flush, sodium chloride flush, zolpidem   Vital Signs    Vitals:   06/20/19 0545 06/20/19 0600 06/20/19 0615 06/20/19 0646  BP: (!) 155/74 (!) 152/82  (!) 147/66 139/82  Pulse: 62 67 65   Resp: 17 (!) 23 18   Temp:      TempSrc:      SpO2: 92% 96% 91%   Weight:      Height:        Intake/Output Summary (Last 24 hours) at 06/20/2019 0911 Last data filed at 06/20/2019 0600 Gross per 24 hour  Intake 2690.97 ml  Output 1201 ml  Net 1489.97 ml   Last 3 Weights 06/20/2019 06/19/2019 06/18/2019  Weight (lbs) 199 lb 4.7 oz 202 lb 9.6 oz 201 lb 8 oz  Weight (kg) 90.4 kg 91.899 kg 91.4 kg      Telemetry    Sinus rhythm- Personally Reviewed  ECG    Sinus bradycardia without major repolarization abnormalities- Personally Reviewed  Physical Exam  Severely obese.  A little bit of tenderness but no ecchymosis or hematoma at radial access site. GEN: No acute distress.   Neck: No JVD Cardiac: RRR, no murmurs, rubs, or gallops.  Respiratory: Clear to auscultation bilaterally. GI: Soft, nontender, non-distended  MS: No edema; No deformity. Neuro:  Nonfocal  Psych: Normal affect   Labs    High Sensitivity Troponin:   Recent Labs  Lab 06/11/19 1214 06/11/19 1511 06/12/19 0547 06/17/19 0820  TROPONINIHS 43* 33.0* 29.0* 76*      Cardiac EnzymesNo results for input(s): TROPONINI in the last 168 hours. No results for input(s): TROPIPOC in the last 168 hours.   Chemistry  Recent Labs  Lab 06/14/19 0216  06/18/19 0610 06/19/19 0432 06/20/19 0411  NA 138   < > 140 140 138  K 4.5   < > 3.9 3.5 3.7  CL 99   < > 103 103 96*  CO2 26   < > 26 24 29   GLUCOSE 102*   < > 118* 88 134*  BUN 41*   < > 24* 19 19  CREATININE 1.81*   < > 1.33* 1.19* 1.23*  CALCIUM 8.7*   < > 9.0 8.0* 9.1  PROT 6.8  --   --   --   --   ALBUMIN 3.7  --   --   --   --   AST 25  --   --   --   --   ALT 46*  --   --   --   --   ALKPHOS 60  --   --   --   --   BILITOT 0.6  --   --   --   --   GFRNONAA 30*   < > 44* 51* 49*  GFRAA 35*   < > 51* 59* 56*  ANIONGAP 13   < > 11 13 13    < > = values in this interval not displayed.     Hematology Recent Labs   Lab 06/16/19 0455 06/17/19 0421 06/20/19 0411  WBC 12.4* 11.9* 8.8  RBC 4.19 4.09 4.37  HGB 12.0 11.7* 12.5  HCT 36.0 35.9* 37.6  MCV 85.9 87.8 86.0  MCH 28.6 28.6 28.6  MCHC 33.3 32.6 33.2  RDW 16.3* 16.4* 16.1*  PLT 177 182 280    BNPNo results for input(s): BNP, PROBNP in the last 168 hours.   DDimer No results for input(s): DDIMER in the last 168 hours.   Radiology    No results found.  Cardiac Studies   06/19/2019 "... A 6 [place] XB LAD 3.5 cm guide catheter along with a 0.14 pro-water guidewire x2 (buddy wire technique) and a 2 mm x 12 mm balloon were used to predilate the proximal LAD...  The stent was advanced to the diseased segment with great difficulty requiring buddy wire technique.  Ultimately, a 3 mm x 12 mm long Synergy drug-eluting stent was then carefully positioned across the predilated proximal segment and deployed at 16 atm.  It was postdilated with a 3.25 x 12 mm balloon up to 16 atm.  The patient developed chest pain after this and angiography in different views revealed a high-grade ostial LAD lesion that was not appreciated and prior angiograms.  After consultation with Dr. Claiborne Billings it was decided to stent this with a 3.5 x 12 mm long Synergy drug-eluting stent at the origin of the left main up to 16 atm (3.60 mm).  I did attempt to postdilated 3.75 x 8 mm balloon but was unable to cross the ostium of the LAD because of wire bias.  The guidewire and catheter were removed.  The sheath was removed and a TR band was placed on the right wrist to achieve patent hemostasis.  The patient was markedly improved at the end of the case.  She left lab in stable condition."  Patient Profile     58 y.o. female with premature onset CAD, obesity, diabetes mellitus, severe hypertension, new onset systolic heart failure in the setting of progression of LAD artery stenosis and a chronic right coronary artery occlusion, now 1 day status post PCI-drug-eluting stent x2 to the  ostial-proximal LAD artery.  Assessment & Plan    1. CHF: Volume assessment is challenging due to severe obesity, but she does not appear to be dyspneic.  Creatinine stable.  Add ARB and long-acting nitrates. 2. CAD: Chronic RCA total occlusion, newly placed drug-eluting stents to the proximal LAD artery, residual stenosis in first oblique marginal artery (which could be responsible for angina in the setting of severe hypertension).  Reinforced the importance of uninterrupted dual antiplatelet therapy for at least 12 months. 3. HTN: Severely elevated, add ARB and long-acting nitrates. 4. HLP: statin dose was increased on this admission.  Recheck a lipid profile in 2-3 months and consider adding ezetimibe or Repatha to achieve LDL less than 70. 5.  COPD with chronic hypoxic respiratory insufficiency: On home O2 2 L at baseline. 6. CKD 3: Creatinine remains at baseline.  Might still be able to discharge later this afternoon if we achieve good blood pressure control and relief of symptoms.  However, it is probably preferable to keep her in the hospital 1 more night while we titrate medications.     For questions or updates, please contact [facility] Please consult [email or web address] for contact info under        Signed, Sanda Klein, MD  06/20/2019, 9:11 AM

## 2019-06-20 NOTE — Telephone Encounter (Signed)
Patient returned call

## 2019-06-20 NOTE — Telephone Encounter (Signed)
Left voice message requesting return call.

## 2019-06-20 NOTE — Discharge Instructions (Signed)
Heart Failure, Diagnosis  Heart failure means that your heart is not able to pump blood in the right way. This makes it hard for your body to work well. Heart failure is usually a long-term (chronic) condition. You must take good care of yourself and follow your treatment plan from your doctor. What are the causes? This condition may be caused by:  High blood pressure.  Build up of cholesterol and fat in the arteries.  Heart attack. This injures the heart muscle.  Heart valves that do not open and close properly.  Damage of the heart muscle. This is also called cardiomyopathy.  Lung disease.  Abnormal heart rhythms. What increases the risk? The risk of heart failure goes up as a person ages. This condition is also more likely to develop in people who:  Are overweight.  Are female.  Smoke or chew tobacco.  Abuse alcohol or illegal drugs.  Have taken medicines that can damage the heart.  Have diabetes.  Have abnormal heart rhythms.  Have thyroid problems.  Have low blood counts (anemia). What are the signs or symptoms? Symptoms of this condition include:  Shortness of breath.  Coughing.  Swelling of the feet, ankles, legs, or belly.  Losing weight for no reason.  Trouble breathing.  Waking from sleep because of the need to sit up and get more air.  Rapid heartbeat.  Being very tired.  Feeling dizzy, or feeling like you may pass out (faint).  Having no desire to eat.  Feeling like you may vomit (nauseous).  Peeing (urinating) more at night.  Feeling confused. How is this treated?     This condition may be treated with:  Medicines. These can be given to treat blood pressure and to make the heart muscles stronger.  Changes in your daily life. These may include eating a healthy diet, staying at a healthy body weight, quitting tobacco and illegal drug use, or doing exercises.  Surgery. Surgery can be done to open blocked valves, or to put  devices in the heart, such as pacemakers.  A donor heart (heart transplant). You will receive a healthy heart from a donor. Follow these instructions at home:  Treat other conditions as told by your doctor. These may include high blood pressure, diabetes, thyroid disease, or abnormal heart rhythms.  Learn as much as you can about heart failure.  Get support as you need it.  Keep all follow-up visits as told by your doctor. This is important. Summary  Heart failure means that your heart is not able to pump blood in the right way.  This condition is caused by high blood pressure, heart attack, or damage of the heart muscle.  Symptoms of this condition include shortness of breath and swelling of the feet, ankles, legs, or belly. You may also feel very tired or feel like you may vomit.  You may be treated with medicines, surgery, or changes in your daily life.  Treat other health conditions as told by your doctor. This information is not intended to replace advice given to you by your health care provider. Make sure you discuss any questions you have with your health care provider. Document Released: 08/18/2008 Document Revised: 01/27/2019 Document Reviewed: 01/27/2019 Elsevier Patient Education  [place].   Heart Failure, Self Care Heart failure is a serious condition. This sheet explains things you need to do to take care of yourself at home. To help you stay as healthy as possible, you may be asked to  change your diet, take certain medicines, and make other changes in your life. Your doctor may also give you more specific instructions. If you have problems or questions, call your doctor. What are the risks? Having heart failure makes it more likely for you to have some problems. These problems can get worse if you do not take good care of yourself. Problems may include:  Blood clotting problems. This may cause a stroke.  Damage to the kidneys, liver, or lungs.  Abnormal  heart rhythms. Supplies needed:  Scale for weighing yourself.  Blood pressure monitor.  Notebook.  Medicines. How to care for yourself when you have heart failure Medicines Take over-the-counter and prescription medicines only as told by your doctor. Take your medicines every day.  Do not stop taking your medicine unless your doctor tells you to do so.  Do not skip any medicines.  Get your prescriptions refilled before you run out of medicine. This is important. Eating and drinking   Eat heart-healthy foods. Talk with a diet specialist (dietitian) to create an eating plan.  Choose foods that: ? Have no trans fat. ? Are low in saturated fat and cholesterol.  Choose healthy foods, such as: ? Fresh or frozen fruits and vegetables. ? Fish. ? Low-fat (lean) meats. ? Legumes, such as beans, peas, and lentils. ? Fat-free or low-fat dairy products. ? Whole-grain foods. ? High-fiber foods.  Limit salt (sodium) if told by your doctor. Ask your diet specialist to tell you which seasonings are healthy for your heart.  Cook in healthy ways instead of frying. Healthy ways of cooking include roasting, grilling, broiling, baking, poaching, steaming, and stir-frying.  Limit how much fluid you drink, if told by your doctor. Alcohol use  Do not drink alcohol if: ? Your doctor tells you not to drink. ? Your heart was damaged by alcohol, or you have very bad heart failure. ? You are pregnant, may be pregnant, or are planning to become pregnant.  If you drink alcohol: ? Limit how much you use to:  0-1 drink a day for women.  0-2 drinks a day for men. ? Be aware of how much alcohol is in your drink. In the [place] one drink equals one 12 oz bottle of beer (355 mL), one 5 oz glass of wine (148 mL), or one 1 oz glass of hard liquor (44 mL). Lifestyle   Do not use any products that contain nicotine or tobacco, such as cigarettes, e-cigarettes, and chewing tobacco. If you need help  quitting, ask your doctor. ? Do not use nicotine gum or patches before talking to your doctor.  Do not use illegal drugs.  Lose weight if told by your doctor.  Do physical activity if told by your doctor. Talk to your doctor before you begin an exercise if: ? You are an older adult. ? You have very bad heart failure.  Learn to manage stress. If you need help, ask your doctor.  Get rehab (rehabilitation) to help you stay independent and to help with your quality of life.  Plan time to rest when you get tired. Check weight and blood pressure   Weigh yourself every day. This will help you to know if fluid is building up in your body. ? Weigh yourself every morning after you pee (urinate) and before you eat breakfast. ? Wear the same amount of clothing each time. ? Write down your daily weight. Give your record to your doctor.  Check and write down your blood  pressure as told by your doctor.  Check your pulse as told by your doctor. Dealing with very hot and very cold weather  If it is very hot: ? Avoid activities that take a lot of energy. ? Use air conditioning or fans, or find a cooler place. ? Avoid caffeine and alcohol. ? Wear clothing that is loose-fitting, lightweight, and light-colored.  If it is very cold: ? Avoid activities that take a lot of energy. ? Layer your clothes. ? Wear mittens or gloves, a hat, and a scarf when you go outside. ? Avoid alcohol. Follow these instructions at home:  Stay up to date with shots (vaccines). Get pneumococcal and flu (influenza) shots.  Keep all follow-up visits as told by your doctor. This is important. Contact a doctor if:  You gain weight quickly.  You have increasing shortness of breath.  You cannot do your normal activities.  You get tired easily.  You cough a lot.  You don't feel like eating or feel like you may vomit (nauseous).  You become puffy (swell) in your hands, feet, ankles, or belly (abdomen).  You  cannot sleep well because it is hard to breathe.  You feel like your heart is beating fast (palpitations).  You get dizzy when you stand up. Get help right away if:  You have trouble breathing.  You or someone else notices a change in your behavior, such as having trouble staying awake.  You have chest pain or discomfort.  You pass out (faint). These symptoms may be an emergency. Do not wait to see if the symptoms will go away. Get medical help right away. Call your local emergency services (911 in the [place] Do not drive yourself to the hospital. Summary  Heart failure is a serious condition. To care for yourself, you may have to change your diet, take medicines, and make other lifestyle changes.  Take your medicines every day. Do not stop taking them unless your doctor tells you to do so.  Eat heart-healthy foods, such as fresh or frozen fruits and vegetables, fish, lean meats, legumes, fat-free or low-fat dairy products, and whole-grain or high-fiber foods.  Ask your doctor if you can drink alcohol. You may have to stop alcohol use if you have very bad heart failure.  Contact your doctor if you gain weight quickly or feel that your heart is beating too fast. Get help right away if you pass out, or have chest pain or trouble breathing. This information is not intended to replace advice given to you by your health care provider. Make sure you discuss any questions you have with your health care provider. Document Released: 02/22/2019 Document Revised: 02/21/2019 Document Reviewed: 02/22/2019 Elsevier Patient Education  2020 [place].   Heart Failure Eating Plan Heart failure, also called congestive heart failure, occurs when your heart does not pump blood well enough to meet your body's needs for oxygen-rich blood. Heart failure is a long-term (chronic) condition. Living with heart failure can be challenging. However, following your health care provider's instructions about a  healthy lifestyle and working with a diet and nutrition specialist (dietitian) to choose the right foods may help to improve your symptoms. What are tips for following this plan? Reading food labels  Check food labels for the amount of sodium per serving. Choose foods that have less than 140 mg (milligrams) of sodium in each serving.  Check food labels for the number of calories per serving. This is important if you need to limit  your daily calorie intake to lose weight.  Check food labels for the serving size. If you eat more than one serving, you will be eating more sodium and calories than what is listed on the label.  Look for foods that are labeled as "sodium-free," "very low sodium," or "low sodium." ? Foods labeled as "reduced sodium" or "lightly salted" may still have more sodium than what is recommended for you. Cooking  Avoid adding salt when cooking. Ask your health care provider or dietitian before using salt substitutes.  Season food with salt-free seasonings, spices, or herbs. Check the label of seasoning mixes to make sure they do not contain salt.  Cook with heart-healthy oils, such as olive, canola, soybean, or sunflower oil.  Do not fry foods. Cook foods using low-fat methods, such as baking, boiling, grilling, and broiling.  Limit unhealthy fats when cooking by: ? Removing the skin from poultry, such as chicken. ? Removing all visible fats from meats. ? Skimming the fat off from stews, soups, and gravies before serving them. Meal planning   Limit your intake of: ? Processed, canned, or pre-packaged foods. ? Foods that are high in trans fat, such as fried foods. ? Sweets, desserts, sugary drinks, and other foods with added sugar. ? Full-fat dairy products, such as whole milk.  Eat a balanced diet that includes: ? 4-5 servings of fruit each day and 4-5 servings of vegetables each day. At each meal, try to fill half of your plate with fruits and vegetables. ? Up  to 6-8 servings of whole grains each day. ? Up to 2 servings of lean meat, poultry, or fish each day. One serving of meat is equal to 3 oz. This is about the same size as a deck of cards. ? 2 servings of low-fat dairy each day. ? Heart-healthy fats. Healthy fats called omega-3 fatty acids are found in foods such as flaxseed and cold-water fish like sardines, salmon, and mackerel.  Aim to eat 25-35 g (grams) of fiber a day. Foods that are high in fiber include apples, broccoli, carrots, beans, peas, and whole grains.  Do not add salt or condiments that contain salt (such as soy sauce) to foods before eating.  When eating at a restaurant, ask that your food be prepared with less salt or no salt, if possible.  Try to eat 2 or more vegetarian meals each week.  Eat more home-cooked food and eat less restaurant, buffet, and fast food. General information  Do not eat more than 2,300 mg of salt (sodium) a day. The amount of sodium that is recommended for you may be lower, depending on your condition.  Maintain a healthy body weight as directed. Ask your health care provider what a healthy weight is for you. ? Check your weight every day. ? Work with your health care provider and dietitian to make a plan that is right for you to lose weight or maintain your current weight.  Limit how much fluid you drink. Ask your health care provider or dietitian how much fluid you can have each day.  Limit or avoid alcohol as told by your health care provider or dietitian. Recommended foods The items listed may not be a complete list. Talk with your dietitian about what dietary choices are best for you. Fruits All fresh, frozen, and canned fruits. Dried fruits, such as raisins, prunes, and cranberries. Vegetables All fresh vegetables. Vegetables that are frozen without sauce or added salt. Low-sodium or sodium-free canned vegetables. Grains Bread  with less than 80 mg of sodium per slice. Whole-wheat pasta,  quinoa, and brown rice. Oats and oatmeal. Barley. [place]. Grits and cream of wheat. Whole-grain and whole-wheat cold cereal. Meats and other protein foods Lean cuts of meat. Skinless chicken and [place]. Fish with high omega-3 fatty acids, such as salmon, sardines, and other cold-water fishes. Eggs. Dried beans, peas, and edamame. Unsalted nuts and nut butters. Dairy Low-fat or nonfat (skim) milk and dried milk. Rice milk, soy milk, and almond milk. Low-fat or nonfat yogurt. Small amounts of reduced-sodium block cheese. Low-sodium cottage cheese. Fats and oils Olive, canola, soybean, flaxseed, or sunflower oil. Avocado. Sweets and desserts Apple sauce. Granola bars. Sugar-free pudding and gelatin. Frozen fruit bars. Seasoning and other foods Fresh and dried herbs. Lemon or lime juice. Vinegar. Low-sodium ketchup. Salt-free marinades, salad dressings, sauces, and seasonings. The items listed above may not be a complete list of foods and beverages you can eat. Contact a dietitian for more information. Foods to avoid The items listed may not be a complete list. Talk with your dietitian about what dietary choices are best for you. Fruits Fruits that are dried with sodium-containing preservatives. Vegetables Canned vegetables. Frozen vegetables with sauce or seasonings. Creamed vegetables. [place] fries. Onion rings. Pickled vegetables and sauerkraut. Grains Bread with more than 80 mg of sodium per slice. Hot or cold cereal with more than 140 mg sodium per serving. Salted pretzels and crackers. Pre-packaged breadcrumbs. Bagels, croissants, and biscuits. Meats and other protein foods Ribs and chicken wings. Bacon, ham, pepperoni, bologna, salami, and packaged luncheon meats. Hot dogs, bratwurst, and sausage. Canned meat. Smoked meat and fish. Salted nuts and seeds. Dairy Whole milk, half-and-half, and cream. Buttermilk. Processed cheese, cheese spreads, and cheese curds. Regular cottage cheese. Feta  cheese. Shredded cheese. String cheese. Fats and oils Butter, lard, shortening, ghee, and bacon fat. Canned and packaged gravies. Seasoning and other foods Onion salt, garlic salt, table salt, and sea salt. Marinades. Regular salad dressings. Relishes, pickles, and olives. Meat flavorings and tenderizers, and bouillon cubes. Horseradish, ketchup, and mustard. Worcestershire sauce. Teriyaki sauce, soy sauce (including reduced sodium). Hot sauce and Tabasco sauce. Steak sauce, fish sauce, oyster sauce, and cocktail sauce. Taco seasonings. Barbecue sauce. Tartar sauce. The items listed above may not be a complete list of foods and beverages you should avoid. Contact a dietitian for more information. Summary  A heart failure eating plan includes changes that limit your intake of sodium and unhealthy fat, and it may help you lose weight or maintain a healthy weight. Your health care provider may also recommend limiting how much fluid you drink.  Most people with heart failure should eat no more than 2,300 mg of salt (sodium) a day. The amount of sodium that is recommended for you may be lower, depending on your condition.  Contact your health care provider or dietitian before making any major changes to your diet. This information is not intended to replace advice given to you by your health care provider. Make sure you discuss any questions you have with your health care provider. Document Released: 03/26/2017 Document Revised: 01/05/2019 Document Reviewed: 03/26/2017 Elsevier Patient Education  [employer].   Heart Failure Action Plan A heart failure action plan helps you understand what to do when you have symptoms of heart failure. Follow the plan that was created by you and your health care provider. Review your plan each time you visit your health care provider. Red zone These signs and symptoms mean  you should get medical help right away:  You have trouble breathing when  resting.  You have a dry cough that is getting worse.  You have swelling or pain in your legs or abdomen that is getting worse.  You suddenly gain more than 2-3 lb (0.9-1.4 kg) in a day, or more than 5 lb (2.3 kg) in one week. This amount may be more or less depending on your condition.  You have trouble staying awake or you feel confused.  You have chest pain.  You do not have an appetite.  You pass out. If you experience any of these symptoms:  Call your local emergency services (911 in the [place] right away or seek help at the emergency department of the nearest hospital. Yellow zone These signs and symptoms mean your condition may be getting worse and you should make some changes:  You have trouble breathing when you are active or you need to sleep with extra pillows.  You have swelling in your legs or abdomen.  You gain 2-3 lb (0.9-1.4 kg) in one day, or 5 lb (2.3 kg) in one week. This amount may be more or less depending on your condition.  You get tired easily.  You have trouble sleeping.  You have a dry cough. If you experience any of these symptoms:  Contact your health care provider within the next day.  Your health care provider may adjust your medicines. Green zone These signs mean you are doing well and can continue what you are doing:  You do not have shortness of breath.  You have very little swelling or no new swelling.  Your weight is stable (no gain or loss).  You have a normal activity level.  You do not have chest pain or any other new symptoms. Follow these instructions at home:  Take over-the-counter and prescription medicines only as told by your health care provider.  Weigh yourself daily. Your target weight is __________ lb (__________ kg). ? Call your health care provider if you gain more than __________ lb (__________ kg) in a day, or more than __________ lb (__________ kg) in one week.  Eat a heart-healthy diet. Work with a diet and  nutrition specialist (dietitian) to create an eating plan that is best for you.  Keep all follow-up visits as told by your health care provider. This is important. Where to find more information  American Heart Association: www.heart.org Summary  Follow the action plan that was created by you and your health care provider.  Get help right away if you have any symptoms in the Red zone. This information is not intended to replace advice given to you by your health care provider. Make sure you discuss any questions you have with your health care provider. Document Released: 12/19/2016 Document Revised: 10/22/2017 Document Reviewed: 12/19/2016 Elsevier Patient Education  2020 [place] With Heart Failure  Heart failure is a long-term (chronic) condition in which the heart cannot pump enough blood through the body. When this happens, parts of the body do not get the blood and oxygen they need. There is no cure for heart failure at this time, so it is important for you to take good care of yourself and follow the treatment plan set by your health care provider. If you are living with heart failure, there are ways to help you manage the disease. Follow these instructions at home: Living with heart failure requires you to make changes in your life. Your health  care team will teach you about the changes you need to make in order to relieve your symptoms and lower your risk of going to the hospital. Follow the treatment plan as set by your health care provider. Medicines Medicines are important in reducing your heart's workload, slowing the progression of heart failure, and improving your symptoms.  Take over-the-counter and prescription medicines only as told by your health care provider.  Do not stop taking your medicine unless your health care provider tells you to do that.  Do not skip any dose of your medicine.  Refill prescriptions before you run out of medicine. You need your  medicines every day. Eating and drinking   Eat heart-healthy foods. Talk with a dietitian to make an eating plan that is right for you. ? If directed by your health care provider: ? Limit salt (sodium). Lowering your sodium intake may reduce symptoms of heart failure. Ask a dietitian to recommend heart-healthy seasonings. ? Limit your fluid intake. Fluid restriction may reduce symptoms of heart failure. ? Use low-fat cooking methods instead of frying. Low-fat methods include roasting, grilling, broiling, baking, poaching, steaming, and stir-frying. ? Choose foods that contain no trans fat and are low in saturated fat and cholesterol. Healthy choices include fresh or frozen fruits and vegetables, fish, lean meats, legumes, fat-free or low-fat dairy products, and whole-grain or high-fiber foods.  Limit alcohol intake to no more than 1 drink a day for nonpregnant women and 2 drinks a day for men. One drink equals 12 oz of beer, 5 oz of wine, or 1 oz of hard liquor. ? Drinking more than that is harmful to your heart. Tell your health care provider if you drink alcohol several times a week. ? Talk with your health care provider about whether any level of alcohol use is safe for you. Activity   Ask your health care provider about attending cardiac rehabilitation. These programs include aerobic physical activity, which provides many benefits for your heart.  If no cardiac rehabilitation program is available, ask your health care provider what aerobic exercises are safe for you to do. Lifestyle Make the lifestyle changes recommended by your health care provider. In general:  Lose weight if your health care provider tells you to do that. Weight loss may reduce symptoms of heart failure.  Do not use any products that contain nicotine or tobacco, such as cigarettes or e-cigarettes. If you need help quitting, ask your health care provider.  Do not use street (illegal) drugs.  Return to your normal  activities as told by your health care provider. Ask your health care provider what activities are safe for you. General instructions   Make sure you weigh yourself every day to track your weight. Rapid weight gain may indicate an increase in fluid in your body and may increase the workload of your heart. ? Weigh yourself every morning. Do this after you urinate but before you eat breakfast. ? Wear the same type of clothing, without shoes, each time you weigh yourself. ? Weigh yourself on the same scale and in the same spot each time.  Living with chronic heart failure often leads to emotions such as fear, stress, anxiety, and depression. If you feel any of these emotions and need help coping, contact your health care provider. Other ways to get help include: ? Talking to friends and family members about your condition. They can give you support and guidance. Explain your symptoms to them and, if comfortable, invite them to attend  appointments or rehabilitation with you. ? Joining a support group for people with chronic heart failure. Talking with other people who have the same symptoms may give you new ways of coping with your disease and your emotions.  Stay up to date with your shots (vaccines). Staying current on pneumococcal and influenza vaccines is especially important in preventing germs from attacking your airways (respiratory infections).  Keep all follow-up visits as told by your health care provider. This is important. How to recognize changes in your condition You and your family members need to know what changes to watch for in your condition. Watch for the following changes and report them to your health care provider:  Sudden weight gain. Ask your health care provider what amount of weight gain to report.  Shortness of breath: ? Feeling short of breath while at rest, with no exercise or activity that required great effort. ? Feeling breathless with activity.  Swelling of  your lower legs or ankles.  Difficulty sleeping: ? You wake up feeling short of breath. ? You have to use more pillows to raise your head in order to sleep.  Frequent, dry, hacking cough.  Loss of appetite.  Feeling more tired all the time.  Depression or feelings of sadness or hopelessness.  Bloating in the stomach. Where to find more information  Local support groups. Ask your health care provider about groups near you.  The American Heart Association: www.heart.org Contact a health care provider if:  You have a rapid weight gain.  You have increasing shortness of breath that is unusual for you.  You are unable to participate in your usual physical activities.  You tire easily.  You cough more than normal, especially with physical activity.  You have any swelling or more swelling in areas such as your hands, feet, ankles, or abdomen.  You feel like your heart is beating quickly (palpitations).  You become dizzy or light-headed when you stand up. Get help right away if:  You have difficulty breathing.  You notice or your family notices a change in your awareness, such as having trouble staying awake or having difficulty with concentration.  You have pain or discomfort in your chest.  You have an episode of fainting (syncope). Summary  There is no cure for heart failure, so it is important for you to take good care of yourself and follow the treatment plan set by your health care provider.  Medicines are important in reducing your heart's workload, slowing the progression of heart failure, and improving your symptoms.  Living with chronic heart failure often leads to emotions such as fear, stress, anxiety, and depression. If you are feeling any of these emotions and need help coping, contact your health care provider. This information is not intended to replace advice given to you by your health care provider. Make sure you discuss any questions you have with your  health care provider. Document Released: 03/24/2017 Document Revised: 10/22/2017 Document Reviewed: 03/24/2017 Elsevier Patient Education  2020 [place].   Heart Failure Exacerbation  Heart failure is a condition in which the heart does not fill up with enough blood, and therefore does not pump enough blood and oxygen to the body. When this happens, parts of the body do not get the blood and oxygen they need to function properly. This can cause symptoms such as breathing problems, fatigue, swelling, and confusion. Heart failure exacerbation refers to heart failure symptoms that get worse. The symptoms may get worse suddenly or develop  slowly over time. Heart failure exacerbation is a serious medical problem that should be treated right away. What are the causes? A heart failure exacerbation can be triggered by:  Not taking your heart failure medicines correctly.  Infections.  Eating an unhealthy diet or a diet that is high in salt (sodium).  Drinking too much fluid.  Drinking alcohol.  Taking illegal drugs, such as cocaine or methamphetamine.  Not exercising. Other causes include:  Other heart conditions such as an irregular heartbeat (arrhythmia).  Anemia.  Other medical problems, such as kidney failure. Sometimes the cause of the exacerbation is not known. What are the signs or symptoms? When heart failure symptoms suddenly or slowly get worse, this may be a sign of heart failure exacerbation. Symptoms of heart failure include:  Breathing problems or shortness of breath.  Chronic coughing or wheezing.  Fatigue.  Nausea or lack of appetite.  Feeling light-headed.  Confusion or memory loss.  Increased heart rate or irregular heartbeat.  Buildup of fluid in the legs, ankles, feet, or abdomen.  Difficulty breathing when lying down. How is this diagnosed? This condition is diagnosed based on:  Your symptoms and medical history.  A physical exam. You may  also have tests, including:  Electrocardiogram (ECG). This test measures the electrical activity of your heart.  Echocardiogram. This test uses sound waves to take a picture of your heart to see how well it works.  Blood tests.  Imaging tests, such as: ? Chest X-ray. ? MRI. ? Ultrasound.  Stress test. This test examines how well your heart functions when you exercise. Your heart is monitored while you exercise on a treadmill or exercise bike. If you cannot exercise, medicines may be used to increase your heartbeat in place of exercise.  Cardiac catheterization. During this test, a thin, flexible tube (catheter) is inserted into a blood vessel and threaded up to your heart. This test allows your health care provider to check the arteries that lead to your heart (coronary arteries).  Right heart catheterization. During this test, the pressure in your heart is measured. How is this treated? This condition may be treated by:  Adjusting your heart medicines.  Maintaining a healthy lifestyle. This includes: ? Eating a heart-healthy diet that is low in sodium. ? Not using any products that contain nicotine or tobacco, such as cigarettes and e-cigarettes. ? Regular exercise. ? Monitoring your fluid intake. ? Monitoring your weight and reporting changes to your health care provider.  Treating sleep apnea, if you have this condition.  Surgery. This may include: ? Implanting a device that helps both sides of your heart contract at the same time (cardiac resynchronization therapy device). This can help with heart function and relieve heart failure symptoms. ? Implanting a device that can correct heart rhythm problems (implantable cardioverter defibrillator). ? Connecting a device to your heart to help it pump blood (ventricular assist device). ? Heart transplant. Follow these instructions at home: Medicines  Take over-the-counter and prescription medicines only as told by your health care  provider.  Do not stop taking your medicines or change the amount you take. If you are having problems or side effects from your medicines, talk to your health care provider.  If you are having difficulty paying for your medicines, contact a social worker or your clinic. There are many programs to assist with medicine costs.  Talk to your health care provider before starting any new medicines or supplements.  Make sure your health care provider and  pharmacist have a list of all the medicines you are taking. Eating and drinking   Avoid drinking alcohol.  Eat a heart-healthy diet as told by your health care provider. This includes: ? Plenty of fruits and vegetables. ? Lean proteins. ? Low-fat dairy. ? Whole grains. ? Foods that are low in sodium. Activity   Exercise regularly as told by your health care provider. Balance exercise with rest.  Ask your health care provider what activities are safe for you. This includes sexual activity, exercise, and daily tasks at home or work. Lifestyle  Do not use any products that contain nicotine or tobacco, such as cigarettes and e-cigarettes. If you need help quitting, ask your health care provider.  Maintain a healthy weight. Ask your health care provider what weight is healthy for you.  Consider joining a patient support group. This can help with emotional problems you may have, such as stress and anxiety. General instructions  Talk to your health care provider about flu and pneumonia vaccines.  Keep a list of medicines that you are taking. This may help in emergency situations.  Keep all follow-up visits as told by your health care provider. This is important. Contact a health care provider if:  You have questions about your medicines or you miss a dose.  You feel anxious, depressed, or stressed.  You have swelling in your feet, ankles, legs, or abdomen.  You have shortness of breath during activity or exercise.  You have a  cough.  You have a fever.  You have trouble sleeping.  You gain 2-3 lb (1-1.4 kg) in 24 hours or 5 lb (2.3 kg) in a week. Get help right away if:  You have chest pain.  You have shortness of breath while resting.  You have severe fatigue.  You are confused.  You have severe dizziness.  You have a rapid or irregular heartbeat.  You have nausea or you vomit.  You have a cough that is worse at night or you cannot lie flat.  You have a cough that will not go away.  You have severe depression or sadness. Summary  When heart failure symptoms get worse, it is called heart failure exacerbation.  Common causes of this condition include taking medicines incorrectly, infections, and drinking alcohol.  This condition may be treated by adjusting medicines, maintaining a healthy lifestyle, or surgery.  Do not stop taking your medicines or change the amount you take. If you are having problems or side effects from your medicines, talk to your health care provider. This information is not intended to replace advice given to you by your health care provider. Make sure you discuss any questions you have with your health care provider. Document Released: 03/23/2017 Document Revised: 10/22/2017 Document Reviewed: 03/23/2017 Elsevier Patient Education  2020 [place]. Heart Failure Education: 1. Weigh yourself EVERY morning after you go to the bathroom but before you eat or drink anything. Write this number down in a weight log/diary. If you gain 3 pounds overnight or 5 pounds in a week, call the office. 2. Take your medicines as prescribed. If you have concerns about your medications, please call us before you stop taking them.  3. Eat low salt foods--Limit salt (sodium) to 2000 mg per day. This will help prevent your body from holding onto fluid. Read food labels as many processed foods have a lot of sodium, especially canned goods and prepackaged meats. If you would like some assistance  choosing low sodium foods, we would  be happy to set you up with a nutritionist. 4. Stay as active as you can everyday. Staying active will give you more energy and make your muscles stronger. Start with 5 minutes at a time and work your way up to 30 minutes a day. Break up your activities--do some in the morning and some in the afternoon. Start with 3 days per week and work your way up to 5 days as you can.  If you have chest pain, feel short of breath, dizzy, or lightheaded, STOP. If you don't feel better after a short rest, call 911. If you do feel better, call the office to let us know you have symptoms with exercise. 5. Limit all fluids for the day to less than 2 liters. Fluid includes all drinks, coffee, juice, ice chips, soup, jello, and all other liquids.   _________________________________________________________________________________________________________  PLEASE REMEMBER TO BRING ALL OF YOUR MEDICATIONS TO EACH OF YOUR FOLLOW-UP OFFICE VISITS.  PLEASE ATTEND ALL SCHEDULED FOLLOW-UP APPOINTMENTS.   Activity: Increase activity slowly as tolerated. You may shower, but no soaking baths (or swimming) for 1 week. No driving for 24 hours. No lifting over 5 lbs for 1 week. No sexual activity for 1 week.   You May Return to Work: in 1 week (if applicable)  Wound Care: You may wash cath site gently with soap and water. Keep cath site clean and dry. If you notice pain, swelling, bleeding or pus at your cath site, please call [phone].

## 2019-06-20 NOTE — Telephone Encounter (Signed)
New Message   Per PA Daleen Snook [employer] scheduled [facility] visit with PA Kerin Ransom on 06/28/2019 at 8:30am

## 2019-06-20 NOTE — Telephone Encounter (Signed)
Follow up ° ° °Patient is returning your call. Please call. ° ° ° °

## 2019-06-20 NOTE — Discharge Summary (Addendum)
Discharge Summary  Tracey Manning ONG:[id number] DOB: [birth year]  PCP: Tracey Ebbs, MD  Admit date: 06/11/2019 Discharge date: 06/20/2019  Time spent: 35 minutes  Recommendations for Outpatient Follow-up:  1. Follow-up with cardiology 2. Follow-up primary care provider 3. Take your medications as prescribed 4. Completely abstain from tobacco.  Discharge Diagnoses:  Active Hospital Problems   Diagnosis Date Noted   Dyspnea on exertion 06/15/2019   CHF (congestive heart failure) (HCC)    Acute on chronic diastolic HF (heart failure) (HCC)    AKI (acute kidney injury) ([place])    Chest pain 11/14/2015   Essential hypertension 04/04/2014   Hyperlipidemia 04/04/2014   Coronary artery disease 04/04/2014    Resolved Hospital Problems  No resolved problems to display.    Discharge Condition: Stable   Diet recommendation: Heart healthy low sodium diet  Vitals:   06/20/19 1647 06/20/19 1715  BP: 119/74   Pulse:    Resp:    Temp:    SpO2:  97%    History of present illness:  Tracey Hendersonis a 58 y.o.femalewith medical history significant ofCAD with multiple stents, HTN, HLD, COPD, anxiety presenting with shortness of breath and nonspecific chest pain.  She was admitted for shortness of breath and nonspecific chest pain. Developed worsening SOB and orthopnea on hospital day 1 requiring transfer to stepdown. Found to have HF exacerbation with low EF and imaging with edema. Cardiology was consulted-planning L and R heart cath.  Transferred to [facility] on 06/15/2019 where she had left and right heart cath. Cath on 7/24 showed occluded RCA with collaterals tight OM and stenosis proximal to stent in mid LAD. Had moderate pulmonary HTN and PCWP 37 mmHg.  Hospital course complicated by persistent chest discomfort. Started on nitro drip per cardiology.  PCI on 06/19/19.  status post PCI-drug-eluting stent x2 to the ostial-proximal LAD artery.  06/20/19: Patient was seen  and examined at bedside this morning.  Reports chest pain overnight relieved by nitro.  At the time of this visit she is chest pain-free.  Seen by cardiology, new medications added. Patient wants to go home. Per cardiology ok to be discharged from a cardiology stand point.  Hospital Course:  Principal Problem:   Dyspnea on exertion Active Problems:   Essential hypertension   Hyperlipidemia   Coronary artery disease   Chest pain   Acute on chronic diastolic HF (heart failure) (HCC)   AKI (acute kidney injury) (HCC)   CHF (congestive heart failure) (HCC)   Newly diagnosed cardiomyopathy/acute systolic CHF/status post PCI drug-eluting stent x2 to the ostial proximal LAD artery.   Post left and right heart cath which showed occluded RCA with collaterals tight OM and stenosis proximal to stent in mid LAD. Had moderate pulmonary HTN and PCWP 37 mmHg Per cardiology not a good candidate for CABG due to COPD, ongoing tobacco use, history of noncompliance. Last 2D echo done during this admission revealed LVEF 25 to 30% Continue cardiac medications as recommended by cardiology Was on strict I's and O's and daily weight Net I&O -2.1L since admission Continue p.o. Lasix 40 mg daily Take the following medications as recommended by cardiology: ASA 81 mg daily, Lipitor 80 mg daily, Plavix 75 mg daily, Lasix 40 mg daily, p.o. hydralazine 50 mg 3 times daily, Imdur 30 mg daily, losartan 50 mg daily, nebivolol 20 mg daily, potassium chloride 40 mEq daily. Follow up with cardiology  Status post PCI-drug-eluting stent x2 to the ostial-proximal LAD artery. Continue aspirin, Plavix and Lipitor 80  mg daily  Acute on chronic hypoxic respiratory failure likely secondary to acute systolic CHF with pulmonary edema in the setting of COPD and ongoing tobacco use Self reported on 2L O2 at home but not always compliant Will do home O2 evaluation prior to dc and assess her O2 requirement.  Resolved uncontrolled  hypertension Nitro drip stopped this morning Blood pressure at goal Continue PO antihypertensives Last vital signs post cardiac medications doses on 06/20/19 at 1647: BP 119/74 HR 62 RR 16.  Resolved Intermittent chest pain Post PCI  CKD 3 Baseline creatinine around 1.2 with GFR 54 Creatinine is back to her baseline 1.2 with GFR 56 Follow up with your PCP  Resolve hypokalemia post repletion On maintenance potassium supplement 40 mEq daily Follow up with your PCP  Pulmonary edema likely cardiogenic, improving Lactic acid and procalcitonin negative On p.o. Lasix, 40 mg daily  Chest discomfort Post heart cath on 06/16/2019 with findings as stated above Nitro drip per cardiology  Coronary artery disease Continue Lipitor and aspirin Not a good candidate for CABG per cardiology due to uncontrolled COPD, ongoing tobacco use with poor functional status Post PCI 06/19/19 with drug-eluting stent  COPD on 2 L nasal cannula continuously at baseline started 2 years ago, noncompliant with oxygen therapy at home Maintain O2 saturation greater than 92% Continue home bronchodilators  Resolved leukocytosis, likely reactive post heart cath WBC 12 K >> 11.9K>> 8.8 on 06/20/19 Afebrile No sign of active infective process Procalcitonin and lactic acid negative on 06/17/2019  Hyperlipidemia Continue Lipitor   Code Status:full    Consultants:  Cardiology  Procedures: PCI on 06/19/19  Echo IMPRESSIONS   1. The left ventricle has severely reduced systolic function, with an ejection fraction of 25-30%. The cavity size was moderately dilated. Left ventricular diastolic function could not be evaluated. Left ventricular diffuse hypokinesis. 2. The right ventricle has normal systolic function. The cavity was normal. 3. The mitral valve is grossly normal. 4. The aortic root is normal in size and structure. 5. The tricuspid valve is grossly normal. 6. The aortic valve  is tricuspid. Mild thickening of the aortic valve. No stenosis of the aortic valve. 7. Severe global reduction in LV systolic function; moderate LVE; mild MR.  LE [place] Summary: Right: There is no evidence of deep vein thrombosis in the lower extremity. No cystic structure found in the popliteal fossa. Left: There is no evidence of deep vein thrombosis in the lower extremity. No cystic structure found in the popliteal fossa.     Discharge Exam: BP 119/74    Pulse 66    Temp 98.1 F (36.7 C) (Oral)    Resp (!) 22    Ht 5' 1"  (1.549 m)    Wt 90.4 kg    LMP  (LMP Unknown) Comment: verified BEFORE imaging   SpO2 97%    BMI 37.66 kg/m   General: 58 y.o. year-old female well developed well nourished in no acute distress.  Alert and oriented x3.  Cardiovascular: Regular rate and rhythm with no rubs or gallops.  No thyromegaly or JVD noted.    Respiratory: Clear to auscultation with no wheezes or rales. Good inspiratory effort.  Abdomen: Soft nontender nondistended with normal bowel sounds x4 quadrants.  Musculoskeletal: No lower extremity edema. 2/4 pulses in all 4 extremities.  Psychiatry: Mood is appropriate for condition and setting  Discharge Instructions You were cared for by a hospitalist during your hospital stay. If you have any questions about your discharge medications  or the care you received while you were in the hospital after you are discharged, you can call the unit and asked to speak with the hospitalist on call if the hospitalist that took care of you is not available. Once you are discharged, your primary care physician will handle any further medical issues. Please note that NO REFILLS for any discharge medications will be authorized once you are discharged, as it is imperative that you return to your primary care physician (or establish a relationship with a primary care physician if you do not have one) for your aftercare needs so that they can reassess your need for  medications and monitor your lab values.  Discharge Instructions    Amb Referral to Cardiac Rehabilitation   Complete by: As directed    Diagnosis: Coronary Stents   After initial evaluation and assessments completed: Virtual Based Care may be provided alone or in conjunction with Phase 2 Cardiac Rehab based on patient barriers.: Yes     Allergies as of 06/20/2019      Reactions   Other Anaphylaxis, Hives   Fresh strawberries (throat swelled)   Amoxicillin Nausea And Vomiting   Did it involve swelling of the face/tongue/throat, SOB, or low BP? Yes Did it involve sudden or severe rash/hives, skin peeling, or any reaction on the inside of your mouth or nose? Yes Did you need to seek medical attention at a hospital or doctor's office? Yes When did it last happen?within last 10 years If all above answers are NO, may proceed with cephalosporin use.   Dilaudid [hydromorphone Hcl] Nausea And Vomiting   Ibuprofen Other (See Comments)   wheezing   Latex Swelling   No reaction with bandaids      Medication List    STOP taking these medications   amLODipine 10 MG tablet Commonly known as: NORVASC   lisinopril-hydrochlorothiazide 20-12.5 MG tablet Commonly known as: ZESTORETIC   methocarbamol 500 MG tablet Commonly known as: ROBAXIN   oxyCODONE-acetaminophen 10-325 MG tablet Commonly known as: Percocet   predniSONE 20 MG tablet Commonly known as: DELTASONE   tinidazole 500 MG tablet Commonly known as: Tindamax     TAKE these medications   albuterol (2.5 MG/3ML) 0.083% nebulizer solution Commonly known as: PROVENTIL Take 6 mLs by nebulization every 6 (six) hours as needed for wheezing or shortness of breath.   albuterol 108 (90 Base) MCG/ACT inhaler Commonly known as: VENTOLIN HFA Inhale 2 puffs into the lungs every 6 (six) hours as needed for wheezing or shortness of breath.   aspirin 81 MG EC tablet Take 1 tablet (81 mg total) by mouth daily. Start taking on:  June 21, 2019 What changed:   medication strength  how much to take  when to take this   atorvastatin 80 MG tablet Commonly known as: LIPITOR Take 1 tablet (80 mg total) by mouth daily. Start taking on: June 21, 2019 What changed:   medication strength  how much to take   Bystolic 20 MG Tabs Generic drug: Nebivolol HCl Take 1 tablet (20 mg total) by mouth daily.   cetirizine 10 MG tablet Commonly known as: ZYRTEC Take 10 mg by mouth daily as needed for allergies.   clopidogrel 75 MG tablet Commonly known as: Plavix Take 1 tablet (75 mg total) by mouth daily with breakfast.   dicyclomine 20 MG tablet Commonly known as: Bentyl Take 1 tablet (20 mg total) by mouth 3 (three) times daily before meals.   fluticasone-salmeterol 115-21 MCG/ACT inhaler Commonly  known as: Advair HFA Inhale 2 puffs into the lungs 2 (two) times daily.   furosemide 40 MG tablet Commonly known as: LASIX Take 1 tablet (40 mg total) by mouth daily. Start taking on: June 21, 2019   hydrALAZINE 50 MG tablet Commonly known as: APRESOLINE Take 1 tablet (50 mg total) by mouth every 8 (eight) hours.   isosorbide mononitrate 30 MG 24 hr tablet Commonly known as: IMDUR Take 1 tablet (30 mg total) by mouth daily. Start taking on: June 21, 2019   losartan 50 MG tablet Commonly known as: COZAAR Take 1 tablet (50 mg total) by mouth daily. Start taking on: June 21, 2019   Narcan 4 MG/0.1ML Liqd nasal spray kit Generic drug: naloxone Place 1 spray into the nose once.   neomycin-polymyxin b-dexamethasone 3.5-10000-0.1 Susp Commonly known as: MAXITROL Place 1 drop into the left eye 2 (two) times daily.   nitroGLYCERIN 0.4 MG SL tablet Commonly known as: NITROSTAT Place 1 tablet (0.4 mg total) under the tongue every 5 (five) minutes as needed for chest pain.   oxyCODONE 5 MG immediate release tablet Commonly known as: Oxy IR/ROXICODONE Take 1 tablet (5 mg total) by mouth daily as needed for  severe pain or breakthrough pain.   pantoprazole 20 MG tablet Commonly known as: PROTONIX Take 2 tablets (40 mg total) by mouth 2 (two) times daily.   potassium chloride SA 20 MEQ tablet Commonly known as: K-DUR Take 2 tablets (40 mEq total) by mouth daily. Start taking on: June 21, 2019   Vitamin D (Ergocalciferol) 1.25 MG (50000 UT) Caps capsule Commonly known as: DRISDOL Take 50,000 Units by mouth once a week.   zolpidem 10 MG tablet Commonly known as: AMBIEN Take 10 mg by mouth at bedtime as needed for sleep.      Allergies  Allergen Reactions   Other Anaphylaxis and Hives    Fresh strawberries (throat swelled)   Amoxicillin Nausea And Vomiting    Did it involve swelling of the face/tongue/throat, SOB, or low BP? Yes Did it involve sudden or severe rash/hives, skin peeling, or any reaction on the inside of your mouth or nose? Yes Did you need to seek medical attention at a hospital or doctor's office? Yes When did it last happen?within last 10 years If all above answers are NO, may proceed with cephalosporin use.    Dilaudid [Hydromorphone Hcl] Nausea And Vomiting   Ibuprofen Other (See Comments)    wheezing   Latex Swelling    No reaction with bandaids   Follow-up Information    Erlene Quan, PA-C Follow up on 06/28/2019.   Specialties: Cardiology, Radiology Why: Please arrive 15 minutes early for your 8:30am post-hospital follow-up appointment Contact information: [place] [address] [phone]        Tracey Ebbs, MD. Call in 1 day(s).   Specialty: Internal Medicine Why: Please call for a post hospital follow up appointment. Contact information: [place] [address] [phone]        [name], MD .   Specialties: Cardiology, Radiology Contact information: [address] [place] [address] [phone]            The results of significant  diagnostics from this hospitalization (including imaging, microbiology, ancillary and laboratory) are listed below for reference.    Significant Diagnostic Studies: Dg Chest 2 View  Result Date: 06/11/2019 CLINICAL DATA:  Increased wheezing for 1-2 days. EXAM: CHEST - 2  VIEW COMPARISON:  Chest x-ray dated 09/05/2018. FINDINGS: Borderline cardiomegaly, stable. Overall cardiomediastinal silhouette is stable. Lungs are clear. No pleural effusion or pneumothorax seen. Osseous structures about the chest are unremarkable. IMPRESSION: No active cardiopulmonary disease. No evidence of pneumonia or pulmonary edema. Electronically Signed   By: Franki Cabot M.D.   On: 06/11/2019 11:15   Ct Angio Chest Pe W Or Wo Contrast  Result Date: 06/12/2019 CLINICAL DATA:  Shortness of breath. EXAM: CT ANGIOGRAPHY CHEST WITH CONTRAST TECHNIQUE: Multidetector CT imaging of the chest was performed using the standard protocol during bolus administration of intravenous contrast. Multiplanar CT image reconstructions and MIPs were obtained to evaluate the vascular anatomy. CONTRAST:  13m OMNIPAQUE IOHEXOL 350 MG/ML SOLN COMPARISON:  Radiograph earlier this day. FINDINGS: Cardiovascular: There are no filling defects within the pulmonary arteries to suggest pulmonary embolus. Thoracic aorta is normal in caliber. Cannot assess for dissection given phase of contrast, no perivascular stranding. Multi chamber cardiomegaly. Coronary artery calcifications/stents. Contrast refluxes into the hepatic veins and IVC. No pericardial effusion. Mediastinum/Nodes: Small reactive appearing mediastinal and hilar lymph nodes. The esophagus is decompressed. No thyroid nodule. Lungs/Pleura: Bilateral ground-glass opacities, central predominant, consistent with pulmonary edema. Mild smooth septal thickening, also pulmonary edema. Small bilateral pleural effusions, right greater than left. Prominent lower lobe peribronchial thickening which may be  congestive. Upper Abdomen: Contrast refluxing into the hepatic veins and IVC. Hepatic steatosis. Probable left adrenal adenomas. Musculoskeletal: There are no acute or suspicious osseous abnormalities. Review of the MIP images confirms the above findings. IMPRESSION: 1. No pulmonary embolus. 2. CHF with cardiomegaly, pulmonary edema, and small pleural effusions. Elevated right heart pressures with contrast refluxing into the hepatic veins and IVC. 3. Coronary artery calcifications. Aortic Atherosclerosis (ICD10-I70.0). Electronically Signed   By: MKeith RakeM.D.   On: 06/12/2019 22:38   [place]  Result Date: 06/15/2019 CLINICAL DATA:  Acute kidney injury EXAM: RENAL / URINARY TRACT ULTRASOUND COMPLETE COMPARISON:  CT 04/13/2018 FINDINGS: Right Kidney: Renal measurements: 10.9 x 4.4 x 4.7 cm = volume: 117 mL. Three cysts within the right kidney, the largest 3.2 cm in the midpole. These are stable since prior CT. No hydronephrosis. Normal echotexture. Left Kidney: Renal measurements: 10.0 x 5.2 x 4.2 cm = volume: 113 mL. Echogenicity within normal limits. No mass or hydronephrosis visualized. Bladder: Decompressed, not visualized. IMPRESSION: No acute findings.  No hydronephrosis. Electronically Signed   By: KRolm BaptiseM.D.   On: 06/15/2019 09:28   Dg Chest Port 1 View  Result Date: 06/16/2019 CLINICAL DATA:  Shortness of breath with edema. EXAM: PORTABLE CHEST 1 VIEW COMPARISON:  06/12/2019 FINDINGS: Lungs are adequately inflated demonstrate improved hazy perihilar density with worsening patchy airspace opacification over the upper lobes right worse than left. Findings are concerning for multifocal pneumonia. No effusions. Stable cardiomegaly. Remainder of the exam is unchanged. IMPRESSION: Worsening patchy bilateral airspace process over the upper lobes right worse than left likely multifocal pneumonia. Stable cardiomegaly. Near resolution of previously noted hazy perihilar opacification. No  effusions. Electronically Signed   By: DMarin OlpM.D.   On: 06/16/2019 09:57   Dg Chest Port 1 View  Result Date: 06/12/2019 CLINICAL DATA:  Shortness of breath EXAM: PORTABLE CHEST 1 VIEW COMPARISON:  June 11, 2019 FINDINGS: No pneumothorax. Stable cardiomegaly. The hila and mediastinum are unremarkable. Mild diffuse interstitial opacities. More focal opacity in the right base. No other acute abnormalities. IMPRESSION: 1. Cardiomegaly.  Mild pulmonary edema. 2. The more focal opacity in  the right base may represent focal infiltrate such as pneumonia or aspiration versus asymmetric edema. Recommend clinical correlation and follow-up to resolution. Electronically Signed   By: Dorise Bullion [name] M.D   On: 06/12/2019 14:48   Vas [place] Lower Extremity Venous (dvt)  Result Date: 06/12/2019  Lower Venous Study Indications: SOB.  Limitations: Patient positioning. Comparison Study: no prior Performing Technologist: Abram Sander RVS  Examination Guidelines: A complete evaluation includes B-mode imaging, spectral Doppler, color Doppler, and power Doppler as needed of all accessible portions of each vessel. Bilateral testing is considered an integral part of a complete examination. Limited examinations for reoccurring indications may be performed as noted.  +---------+---------------+---------+-----------+----------+-------+  RIGHT     Compressibility Phasicity Spontaneity Properties Summary  +---------+---------------+---------+-----------+----------+-------+  CFV       Full            Yes       Yes                             +---------+---------------+---------+-----------+----------+-------+  SFJ       Full                                                      +---------+---------------+---------+-----------+----------+-------+  FV Prox   Full                                                      +---------+---------------+---------+-----------+----------+-------+  FV Mid    Full                                                       +---------+---------------+---------+-----------+----------+-------+  FV Distal Full                                                      +---------+---------------+---------+-----------+----------+-------+  PFV       Full                                                      +---------+---------------+---------+-----------+----------+-------+  POP       Full            Yes       Yes                             +---------+---------------+---------+-----------+----------+-------+  PTV       Full                                                      +---------+---------------+---------+-----------+----------+-------+  PERO      Full                                                      +---------+---------------+---------+-----------+----------+-------+   +---------+---------------+---------+-----------+----------+-------+  LEFT      Compressibility Phasicity Spontaneity Properties Summary  +---------+---------------+---------+-----------+----------+-------+  CFV       Full            Yes       Yes                             +---------+---------------+---------+-----------+----------+-------+  SFJ       Full                                                      +---------+---------------+---------+-----------+----------+-------+  FV Prox   Full                                                      +---------+---------------+---------+-----------+----------+-------+  FV Mid    Full                                                      +---------+---------------+---------+-----------+----------+-------+  FV Distal Full                                                      +---------+---------------+---------+-----------+----------+-------+  PFV       Full                                                      +---------+---------------+---------+-----------+----------+-------+  POP       Full            Yes       Yes                              +---------+---------------+---------+-----------+----------+-------+  PTV       Full                                                      +---------+---------------+---------+-----------+----------+-------+  PERO      Full                                                      +---------+---------------+---------+-----------+----------+-------+  Summary: Right: There is no evidence of deep vein thrombosis in the lower extremity. No cystic structure found in the popliteal fossa. Left: There is no evidence of deep vein thrombosis in the lower extremity. No cystic structure found in the popliteal fossa.  *See table(s) above for measurements and observations. Electronically signed by Servando Snare MD on 06/12/2019 at 3:53:02 PM.    Final     Microbiology: Recent Results (from the past 240 hour(s))  SARS Coronavirus 2 (CEPHEID- Performed in [place] hospital lab), Hosp Order     Status: None   Collection Time: 06/11/19 12:14 PM   Specimen: Nasopharyngeal Swab  Result Value Ref Range Status   SARS Coronavirus 2 NEGATIVE NEGATIVE Final    Comment: (NOTE) If result is NEGATIVE SARS-CoV-2 target nucleic acids are NOT DETECTED. The SARS-CoV-2 RNA is generally detectable in upper and lower  respiratory specimens during the acute phase of infection. The lowest  concentration of SARS-CoV-2 viral copies this assay can detect is 250  copies / mL. A negative result does not preclude SARS-CoV-2 infection  and should not be used as the sole basis for treatment or other  patient management decisions.  A negative result may occur with  improper specimen collection / handling, submission of specimen other  than nasopharyngeal swab, presence of viral mutation(s) within the  areas targeted by this assay, and inadequate number of viral copies  (<250 copies / mL). A negative result must be combined with clinical  observations, patient history, and epidemiological information. If result is POSITIVE SARS-CoV-2  target nucleic acids are DETECTED. The SARS-CoV-2 RNA is generally detectable in upper and lower  respiratory specimens dur ing the acute phase of infection.  Positive  results are indicative of active infection with SARS-CoV-2.  Clinical  correlation with patient history and other diagnostic information is  necessary to determine patient infection status.  Positive results do  not rule out bacterial infection or co-infection with other viruses. If result is PRESUMPTIVE POSTIVE SARS-CoV-2 nucleic acids MAY BE PRESENT.   A presumptive positive result was obtained on the submitted specimen  and confirmed on repeat testing.  While 2019 novel coronavirus  (SARS-CoV-2) nucleic acids may be present in the submitted sample  additional confirmatory testing may be necessary for epidemiological  and / or clinical management purposes  to differentiate between  SARS-CoV-2 and other Sarbecovirus currently known to infect humans.  If clinically indicated additional testing with an alternate test  methodology [phone]) is advised. The SARS-CoV-2 RNA is generally  detectable in upper and lower respiratory sp ecimens during the acute  phase of infection. The expected result is Negative. Fact Sheet for Patients:  StrictlyIdeas.no Fact Sheet for Healthcare Providers: BankingDealers.co.za This test is not yet approved or cleared by the [place] FDA and has been authorized for detection and/or diagnosis of SARS-CoV-2 by FDA under an Emergency Use Authorization (EUA).  This EUA will remain in effect (meaning this test can be used) for the duration of the COVID-19 declaration under Section 564(b)(1) of the Act, 21 U.S.C. section 360bbb-3(b)(1), unless the authorization is terminated or revoked sooner. Performed at [facility], [place] [address], [place], [place] [address]   MRSA PCR Screening     Status: None   Collection Time: 06/13/19   4:16 AM   Specimen: Nasal Mucosa; Nasopharyngeal  Result Value Ref Range Status   MRSA by PCR NEGATIVE NEGATIVE Final    Comment:        The GeneXpert  MRSA Assay (FDA approved for NASAL specimens only), is one component of a comprehensive MRSA colonization surveillance program. It is not intended to diagnose MRSA infection nor to guide or monitor treatment for MRSA infections. Performed at [facility], [place] [address], [place], [place] [address]      Labs: Basic Metabolic Panel: Recent Labs  Lab 06/14/19 0216  06/16/19 0455 06/17/19 0421 06/18/19 0610 06/19/19 0432 06/20/19 0411  NA 138   < > 138 137 140 140 138  K 4.5   < > 4.1 4.2 3.9 3.5 3.7  CL 99   < > 101 102 103 103 96*  CO2 26   < > 27 26 26 24 29   GLUCOSE 102*   < > 127* 108* 118* 88 134*  BUN 41*   < > 17 21* 24* 19 19  CREATININE 1.81*   < > 1.27* 1.39* 1.33* 1.19* 1.23*  CALCIUM 8.7*   < > 9.0 9.1 9.0 8.0* 9.1  MG 2.2  --   --   --   --   --   --    < > = values in this interval not displayed.   Liver Function Tests: Recent Labs  Lab 06/14/19 0216  AST 25  ALT 46*  ALKPHOS 60  BILITOT 0.6  PROT 6.8  ALBUMIN 3.7   No results for input(s): LIPASE, AMYLASE in the last 168 hours. No results for input(s): AMMONIA in the last 168 hours. CBC: Recent Labs  Lab 06/15/19 0501  06/15/19 1046 06/15/19 1630 06/16/19 0455 06/17/19 0421 06/20/19 0411  WBC 11.1*  --   --  9.1 12.4* 11.9* 8.8  HGB 12.5   < > 11.9* 13.1 12.0 11.7* 12.5  HCT 39.4   < > 35.0* 39.3 36.0 35.9* 37.6  MCV 88.5  --   --  86.2 85.9 87.8 86.0  PLT 195  --   --  203 177 182 280   < > = values in this interval not displayed.   Cardiac Enzymes: No results for input(s): CKTOTAL, CKMB, CKMBINDEX, TROPONINI in the last 168 hours. BNP: BNP (last 3 results) Recent Labs    06/12/19 1411  BNP 600.7*    ProBNP (last 3 results) No results for input(s): PROBNP in the last 8760 hours.  CBG: No results for  input(s): GLUCAP in the last 168 hours.     Signed:  Kayleen Memos, MD [facility] 06/20/2019, 5:34 PM

## 2019-06-22 ENCOUNTER — Telehealth: Payer: Self-pay | Admitting: Cardiovascular Disease

## 2019-06-22 NOTE — Telephone Encounter (Signed)
S/w Baxter Flattery she states that pt is on Medicaid and needs to take daily weights. I will call her tomorrow with more information.

## 2019-06-22 NOTE — Telephone Encounter (Signed)
New message   Per Baxter Flattery wants to know if this patient can get a scale for daily weight. She states that patient does not have Nebibolol 20 mg daily that is listed on the medication list. Please call to discuss.

## 2019-06-23 NOTE — Telephone Encounter (Signed)
Patient contacted regarding discharge from [facility] on 06/20/19.  Patient understands to follow up with provider Kerin Ransom PA on 06/28/19 at 0830 at [facility]. Patient understands discharge instructions? YES Patient understands medications and regiment? YES - medicaid will not cover Bystolic Patient understands to bring all medications to this visit? YES  Will route message to Durbin RN concerning need for PA for bystolic (?) thru [facility]

## 2019-06-23 NOTE — Telephone Encounter (Signed)
Staff message sent to Neil Crouch (care guide) to assist with scale

## 2019-06-26 ENCOUNTER — Encounter

## 2019-06-28 ENCOUNTER — Encounter: Payer: Self-pay | Admitting: Cardiology

## 2019-06-28 ENCOUNTER — Ambulatory Visit (INDEPENDENT_AMBULATORY_CARE_PROVIDER_SITE_OTHER): Payer: Medicaid Other | Admitting: Cardiology

## 2019-06-28 ENCOUNTER — Other Ambulatory Visit: Payer: Self-pay

## 2019-06-28 VITALS — BP 120/88 | HR 74 | Ht 61.0 in | Wt 205.0 lb

## 2019-06-28 DIAGNOSIS — I5021 Acute systolic (congestive) heart failure: Secondary | ICD-10-CM

## 2019-06-28 DIAGNOSIS — N179 Acute kidney failure, unspecified: Secondary | ICD-10-CM | POA: Diagnosis not present

## 2019-06-28 DIAGNOSIS — J449 Chronic obstructive pulmonary disease, unspecified: Secondary | ICD-10-CM | POA: Insufficient documentation

## 2019-06-28 DIAGNOSIS — E785 Hyperlipidemia, unspecified: Secondary | ICD-10-CM

## 2019-06-28 DIAGNOSIS — I10 Essential (primary) hypertension: Secondary | ICD-10-CM

## 2019-06-28 DIAGNOSIS — Z9861 Coronary angioplasty status: Secondary | ICD-10-CM

## 2019-06-28 DIAGNOSIS — I2510 Atherosclerotic heart disease of native coronary artery without angina pectoris: Secondary | ICD-10-CM | POA: Diagnosis not present

## 2019-06-28 DIAGNOSIS — I5033 Acute on chronic diastolic (congestive) heart failure: Secondary | ICD-10-CM

## 2019-06-28 DIAGNOSIS — N183 Chronic kidney disease, stage 3 unspecified: Secondary | ICD-10-CM

## 2019-06-28 DIAGNOSIS — I255 Ischemic cardiomyopathy: Secondary | ICD-10-CM | POA: Insufficient documentation

## 2019-06-28 DIAGNOSIS — J439 Emphysema, unspecified: Secondary | ICD-10-CM

## 2019-06-28 DIAGNOSIS — E669 Obesity, unspecified: Secondary | ICD-10-CM

## 2019-06-28 LAB — BASIC METABOLIC PANEL
BUN/Creatinine Ratio: 14 (ref 9–23)
BUN: 17 mg/dL (ref 6–24)
CO2: 24 mmol/L (ref 20–29)
Calcium: 9.5 mg/dL (ref 8.7–10.2)
Chloride: 103 mmol/L (ref 96–106)
Creatinine, Ser: 1.23 mg/dL — ABNORMAL HIGH (ref 0.57–1.00)
GFR calc Af Amer: 56 mL/min/{1.73_m2} — ABNORMAL LOW (ref >59)
GFR calc non Af Amer: 48 mL/min/{1.73_m2} — ABNORMAL LOW (ref >59)
Glucose: 87 mg/dL (ref 65–99)
Potassium: 4.4 mmol/L (ref 3.5–5.2)
Sodium: 144 mmol/L (ref 134–144)

## 2019-06-28 MED ORDER — CARVEDILOL 3.125 MG PO TABS: 3.1250 mg | ORAL_TABLET | Freq: Two times a day (BID) | ORAL | 1 refills | Status: DC

## 2019-06-28 MED ORDER — POTASSIUM CHLORIDE CRYS ER 20 MEQ PO TBCR: 20.0000 meq | EXTENDED_RELEASE_TABLET | Freq: Every day | ORAL | 3 refills | Status: DC

## 2019-06-28 MED ORDER — SPIRONOLACTONE 25 MG PO TABS: 12.5000 mg | ORAL_TABLET | Freq: Every day | ORAL | 3 refills | Status: DC

## 2019-06-28 NOTE — Assessment & Plan Note (Signed)
Pt admitted 06/11/2019 with CHF and new LVD- EF 25%. Discharge wgt 199 lbs

## 2019-06-28 NOTE — Assessment & Plan Note (Signed)
New AKI- SCr 1.8 on admission 7/22- down to 1.23 at discharge

## 2019-06-28 NOTE — Assessment & Plan Note (Signed)
PCI in [place] OM PCI '08-Dr Claiborne Billings LAD PCI/DES 2012- Dr Claiborne Billings pLAD PCI with DES 06/19/2019- Dr Gwenlyn Found after patient turned down for CABG. Known occluded RCA with L-R collaterals, 65% mCFX-EF 25%

## 2019-06-28 NOTE — Assessment & Plan Note (Signed)
New LVD July 2020-EF 25%

## 2019-06-28 NOTE — Assessment & Plan Note (Signed)
Controlled.  

## 2019-06-28 NOTE — Assessment & Plan Note (Signed)
Former smoker- home O2 PRN

## 2019-06-28 NOTE — Assessment & Plan Note (Signed)
BMI 37  

## 2019-06-28 NOTE — Assessment & Plan Note (Signed)
LDL 131 July 2020- discharged on high dose statin Rx

## 2019-06-28 NOTE — Patient Instructions (Signed)
Medication Instructions:  DECREASE Potassium to one tablet once a day START Spironolactone 12.5mg  Take 1 tablet daily (tablet comes in 25mg  take half tablet daily) START Coreg 3.125mg  Take 1 tablet twice a day  STOP Bystolic If you need a refill on your cardiac medications before your next appointment, please call your pharmacy.   Lab work: Your physician recommends that you return for lab work in: TODAY-BMET If you have labs (blood work) drawn today and your tests are completely normal, you will receive your results only by: Marland Kitchen MyChart Message (if you have MyChart) OR . A paper copy in the mail If you have any lab test that is abnormal or we need to change your treatment, we will call you to review the results.  Testing/Procedures: NONE  Follow-Up: At [facility], you and your health needs are our priority.  As part of our continuing mission to provide you with exceptional heart care, we have created designated Provider Care Teams.  These Care Teams include your primary Cardiologist (physician) and Advanced Practice Providers (APPs -  Physician Assistants and Nurse Practitioners) who all work together to provide you with the care you need, when you need it.  . Your physician recommends that you schedule a follow-up appointment in: [place], PA-C  Any Other Special Instructions Will Be Listed Below (If Applicable).

## 2019-06-28 NOTE — Progress Notes (Addendum)
Cardiology Office Note:    Date:  06/28/2019   ID:  Tracey Manning, DOB [birth year], MRN [id number]  PCP:  Tracey Ebbs, MD  Cardiologist:  Tracey Burow, MD  Electrophysiologist:  None   Referring MD: Tracey Ebbs, MD   No chief complaint on file. TOC follow up  History of Present Illness:    Tracey Manning is a 58 y.o. female with a hx of coronary disease.  She had a remote PCI in 2004 in [place].  In 2008 she had a circumflex PCI by Dr. Claiborne Manning.  She had known occluded RCA with left-to-right collaterals.  In 2012 she had an LAD stent placed again by Dr. Claiborne Manning.  Patient is seen in the office today as a post hospital follow-up.  She was admitted 06/11/2019 with acute respiratory failure and unstable angina.  She had new LV dysfunction with an EF of 25%.  She had acute kidney injury as well with a creatinine of 1.8.  Patient was admitted, diuresed, and underwent diagnostic catheterization 06/15/2019.  This revealed a 60 to 70% mid circumflex stenosis, known occluded RCA with left-to-right collaterals, and a 95% proximal LAD before the previously placed stent which was 50 to 60% narrowed.  After review the patient was turned down for CABG.  She underwent staged intervention to her LAD on 06/19/2019 by Dr. Gwenlyn Manning.  She is seen today in follow-up.  Since discharge she is noted she has gained some weight.  She says she is not smoking.  She uses oxygen as needed at home.  She was discharged on Bystolic and says she cannot afford this.  She tells me she took metoprolol in the past and it gave her migraines and did not want to try this again.  She is also having issues taking her potassium pill.  She is not had chest pain.  Past Medical History:  Diagnosis Date  . Anxiety   . Arthritis   . Asthma   . Barrett's esophagus   . Chest pain   . Collagen vascular disease ([place])   . Colon polyps   . Coronary artery disease   . Depression   . Dyslipidemia   . GERD (gastroesophageal reflux  disease)    occ  . Hypertension   . Myocardial infarct ([place]) [id number]  . Sciatica   . Sleep apnea    no CPAP ordered but using oxygen at bedtime  . Tobacco abuse     Past Surgical History:  Procedure Laterality Date  . CARDIAC CATHETERIZATION  10/09/2003   [place]) - LAD with 30% prox narrowing, 50% stenosis in mid-portion of PLA; RCA with 40% narrowing proximally ([name], [name])  . CARDIAC CATHETERIZATION  10/10/2007   70% stenosis in first septal perforator branch of LAD, 60-70% narrowing in mid LAD, 20% narrowing in mid AV groove Cfx with 80% diffuse narrowing in small distal marginal, total occlusion of mid RCA with L to R collaterals, 90% stenosis diffusely in prox branch of RCA followed by 70% stenosis in secondary curve & 80% stenosis in small marginal branch ([name])  . CARDIAC CATHETERIZATION  05/28/2008   normal L main, RCA with 100% prox lesion w/distal filling from LAD collaterals, LAD with 20% prox tubular lesion/ 40% mid LAD lesion/previous stent patent ([name])  . CARDIAC CATHETERIZATION  09/15/2009   discrete 100% osital RCA lesion, 50% prox LAD lesion, non-obstructive disease in all coronaries ([name])  . CESAREAN SECTION    .  COLONOSCOPY    . COLONOSCOPY W/ POLYPECTOMY    . CORONARY ANGIOPLASTY WITH STENT PLACEMENT  10/31/2007   PCI of distal Cfx with 2.25x83m Taxus Adam DES, 60% narrowing of mid LAD (Dr. TCorky Downs  . CORONARY ANGIOPLASTY WITH STENT PLACEMENT  06/11/2011   PCI of prox-mid LAD with 3x123mDES Resolute (Dr. T.[name] . CORONARY STENT INTERVENTION N/A 06/19/2019   Procedure: CORONARY STENT INTERVENTION;  Surgeon: BeLorretta HarpMD;  [facility];  Service: Cardiovascular;  Laterality: N/A;  LAD  . DILATION AND CURETTAGE, DIAGNOSTIC / THERAPEUTIC    . DILITATION & CURRETTAGE/HYSTROSCOPY WITH HYDROTHERMAL ABLATION N/A 04/03/2016   Procedure: DILATATION & CURETTAGE/HYSTEROSCOPY WITH  HYDROTHERMAL ABLATION;  Surgeon: ChShelly BombardMD;  [facility];  Service: Gynecology;  Laterality: N/A;  . NM MYOCAR PERF WALL MOTION  08/2009   persantine myoview - normal perfusion in all regions, perfusion defect in anterior region (breast attenuation), EF 52%, low risk scan  . RIGHT/LEFT HEART CATH AND CORONARY ANGIOGRAPHY N/A 06/15/2019   Procedure: RIGHT/LEFT HEART CATH AND CORONARY ANGIOGRAPHY;  Surgeon: SmBelva CromeMD;  [facility];  Service: Cardiovascular;  Laterality: N/A;    Current Medications: Current Meds  Medication Sig  . albuterol (PROVENTIL HFA;VENTOLIN HFA) 108 (90 BASE) MCG/ACT inhaler Inhale 2 puffs into the lungs every 6 (six) hours as needed for wheezing or shortness of breath.  . Marland Kitchenlbuterol (PROVENTIL) (2.5 MG/3ML) 0.083% nebulizer solution Take 6 mLs by nebulization every 6 (six) hours as needed for wheezing or shortness of breath.  . Marland Kitchenspirin EC 81 MG EC tablet Take 1 tablet (81 mg total) by mouth daily.  . Marland Kitchentorvastatin (LIPITOR) 80 MG tablet Take 1 tablet (80 mg total) by mouth daily.  . cetirizine (ZYRTEC) 10 MG tablet Take 10 mg by mouth daily as needed for allergies.  . Marland Kitchenlopidogrel (PLAVIX) 75 MG tablet Take 1 tablet (75 mg total) by mouth daily with breakfast.  . dicyclomine (BENTYL) 20 MG tablet Take 1 tablet (20 mg total) by mouth 3 (three) times daily before meals.  . fluticasone-salmeterol (ADVAIR HFA) 115-21 MCG/ACT inhaler Inhale 2 puffs into the lungs 2 (two) times daily.  . furosemide (LASIX) 40 MG tablet Take 1 tablet (40 mg total) by mouth daily.  . hydrALAZINE (APRESOLINE) 50 MG tablet Take 1 tablet (50 mg total) by mouth every 8 (eight) hours.  . isosorbide mononitrate (IMDUR) 30 MG 24 hr tablet Take 1 tablet (30 mg total) by mouth daily.  . Marland Kitchenosartan (COZAAR) 50 MG tablet Take 1 tablet (50 mg total) by mouth daily.  . Marland KitchenARCAN 4 MG/0.1ML LIQD nasal spray kit Place 1 spray into the nose once.   . neomycin-polymyxin  b-dexamethasone (MAXITROL) 3.5-10000-0.1 SUSP Place 1 drop into the left eye 2 (two) times daily.  . nitroGLYCERIN (NITROSTAT) 0.4 MG SL tablet Place 1 tablet (0.4 mg total) under the tongue every 5 (five) minutes as needed for chest pain.  . Marland KitchenxyCODONE (OXY IR/ROXICODONE) 5 MG immediate release tablet Take 1 tablet (5 mg total) by mouth daily as needed for severe pain or breakthrough pain.  . pantoprazole (PROTONIX) 20 MG tablet Take 2 tablets (40 mg total) by mouth 2 (two) times daily.  . potassium chloride SA (K-DUR) 20 MEQ tablet Take 1 tablet (20 mEq total) by mouth daily.  . Vitamin D, Ergocalciferol, (DRISDOL) 1.25 MG (50000 UT) CAPS capsule Take 50,000 Units by mouth once a week.  . zolpidem (AMBIEN) 10  MG tablet Take 10 mg by mouth at bedtime as needed for sleep.  . [DISCONTINUED] Nebivolol HCl (BYSTOLIC) 20 MG TABS Take 1 tablet (20 mg total) by mouth daily.  . [DISCONTINUED] potassium chloride SA (K-DUR) 20 MEQ tablet Take 2 tablets (40 mEq total) by mouth daily.     Allergies:   Other, Amoxicillin, Dilaudid [hydromorphone hcl], Ibuprofen, Latex, and Metoprolol   Social History   Socioeconomic History  . Marital status: Single    Spouse name: Not on file  . Number of children: 2  . Years of education: 56  . Highest education level: Not on file  Occupational History  . Not on file  Social Needs  . Financial resource strain: Not on file  . Food insecurity    Worry: Not on file    Inability: Not on file  . Transportation needs    Medical: Not on file    Non-medical: Not on file  Tobacco Use  . Smoking status: Former Smoker    Packs/day: 0.50    Years: 0.00    Pack years: 0.00    Types: Cigarettes  . Smokeless tobacco: Never Used  Substance and Sexual Activity  . Alcohol use: No    Alcohol/week: 0.0 standard drinks    Comment: seldom  . Drug use: Yes    Types: Marijuana    Comment: used last night  . Sexual activity: Yes    Partners: Male    Birth  control/protection: Post-menopausal  Lifestyle  . Physical activity    Days per week: Not on file    Minutes per session: Not on file  . Stress: Not on file  Relationships  . Social Herbalist on phone: Not on file    Gets together: Not on file    Attends religious service: Not on file    Active member of club or organization: Not on file    Attends meetings of clubs or organizations: Not on file    Relationship status: Not on file  Other Topics Concern  . Not on file  Social History Narrative  . Not on file     Family History: The patient's family history includes Bleeding Disorder in her son; Clotting disorder in her father; Colon cancer in her paternal grandmother; Diabetes in her paternal grandmother and sister; Hypertension in her sister; Leukemia in her mother; Stroke (age of onset: 42) in her sister. There is no history of Stomach cancer or Pancreatic cancer.  ROS:   Please see the history of present illness.     All other systems reviewed and are negative.  EKGs/Labs/Other Studies Reviewed:    The following studies were reviewed today: Cath 06/15/2019 PCI 06/19/2019 Echo 06/12/2019  EKG:  EKG is ordered today.  The ekg ordered today demonstrates NSR- HR 74  Recent Labs: 06/12/2019: B Natriuretic Peptide 600.7 06/14/2019: ALT 46; Magnesium 2.2 06/20/2019: BUN 19; Creatinine, Ser 1.23; Hemoglobin 12.5; Platelets 280; Potassium 3.7; Sodium 138  Recent Lipid Panel    Component Value Date/Time   CHOL 220 (H) 06/14/2019 0216   TRIG 161 (H) 06/14/2019 0216   HDL 57 06/14/2019 0216   CHOLHDL 3.9 06/14/2019 0216   VLDL 32 06/14/2019 0216   LDLCALC 131 (H) 06/14/2019 0216    Physical Exam:    VS:  BP 120/88   Pulse 74   Ht '5\' 1"'$  (1.549 m)   Wt 205 lb (93 kg)   LMP  (LMP Unknown) Comment: verified BEFORE imaging  SpO2 98%   BMI 38.73 kg/m     Wt Readings from Last 3 Encounters:  06/28/19 205 lb (93 kg)  06/20/19 199 lb 4.7 oz (90.4 kg)  06/05/19 206  lb 1.6 oz (93.5 kg)     GEN: Overweight AA female, well developed in no acute distress HEENT: Normal NECK: No JVD; No carotid bruits LYMPHATICS: No lymphadenopathy CARDIAC: RRR, no murmurs, rubs, gallops RESPIRATORY:  Clear to auscultation without rales, wheezing or rhonchi  ABDOMEN: Soft, non-tender, non-distended MUSCULOSKELETAL:  No edema; No deformity  SKIN: Warm and dry NEUROLOGIC:  Alert and oriented x 3 PSYCHIATRIC:  Normal affect   ASSESSMENT:    Acute systolic CHF (congestive heart failure) ([place]) Pt admitted 06/11/2019 with CHF and new LVD- EF 25%. Discharge wgt 199 lbs  CAD S/P multiple PCIs PCI in [place] OM PCI '08-Dr Tracey Manning LAD PCI/DES 2012- Dr Tracey Manning pLAD PCI with DES 06/19/2019- Dr Tracey Manning after patient turned down for CABG. Known occluded RCA with L-R collaterals, 65% mCFX-EF 25%  Ischemic cardiomyopathy New LVD July 2020-EF 25%  AKI (acute kidney injury) ([place]) New AKI- SCr 1.8 on admission 7/22- down to 1.23 at discharge  COPD (chronic obstructive pulmonary disease) ([place]) Former smoker- home O2 PRN  Dyslipidemia, goal LDL below 70 LDL 131 July 2020- discharged on high dose statin Rx  Essential hypertension Controlled  Obesity (BMI 30-39.9) BMI 37  PLAN:    Add Aldactone 12.5 mg daily, decrease K-Dur to 20 MEQ, check BMP today.  I suspect she was discharged on Bystolic secondary to COPD and respiratory failure on admission.  She refuses Metoprolol secondary to past side effects (migraine).  She is not wheezing on exam-will try low dose Coreg.   I'll see her back in a couple of weeks to consider changing her Losartan to [facility] and begin weaning her off Hydralazine.  She'll also need f/u lipids in 6 weeks and an echo in 3 months.    Medication Adjustments/Labs and Tests Ordered: Current medicines are reviewed at length with the patient today.  Concerns regarding medicines are outlined above.  Orders Placed This Encounter  Procedures  . Basic  Metabolic Panel (BMET)  . EKG 12-Lead   Meds ordered this encounter  Medications  . potassium chloride SA (K-DUR) 20 MEQ tablet    Sig: Take 1 tablet (20 mEq total) by mouth daily.    Dispense:  30 tablet    Refill:  3  . spironolactone (ALDACTONE) 25 MG tablet    Sig: Take 0.5 tablets (12.5 mg total) by mouth daily.    Dispense:  30 tablet    Refill:  3  . carvedilol (COREG) 3.125 MG tablet    Sig: Take 1 tablet (3.125 mg total) by mouth 2 (two) times daily.    Dispense:  60 tablet    Refill:  1    Patient Instructions  Medication Instructions:  DECREASE Potassium to one tablet once a day START Spironolactone 12.'5mg'$  Take 1 tablet daily (tablet comes in '25mg'$  take half tablet daily) START Coreg 3.'125mg'$  Take 1 tablet twice a day  STOP Bystolic If you need a refill on your cardiac medications before your next appointment, please call your pharmacy.   Lab work: Your physician recommends that you return for lab work in: TODAY-BMET If you have labs (blood work) drawn today and your tests are completely normal, you will receive your results only by: Marland Kitchen MyChart Message (if you have MyChart) OR . A paper copy in the mail  If you have any lab test that is abnormal or we need to change your treatment, we will call you to review the results.  Testing/Procedures: NONE  Follow-Up: At [facility], you and your health needs are our priority.  As part of our continuing mission to provide you with exceptional heart care, we have created designated Provider Care Teams.  These Care Teams include your primary Cardiologist (physician) and Advanced Practice Providers (APPs -  Physician Assistants and Nurse Practitioners) who all work together to provide you with the care you need, when you need it.  . Your physician recommends that you schedule a follow-up appointment in: [place], PA-C  Any Other Special Instructions Will Be Listed Below (If Applicable).      Signed, Kerin Ransom, PA-C  06/28/2019 9:08 AM    [facility]

## 2019-06-30 ENCOUNTER — Telehealth: Payer: Self-pay

## 2019-06-30 NOTE — Telephone Encounter (Signed)
Called patient to give her lab results and she stated that her head is banging. She wanted to know if it was coming from one of the new medications she was just stated on.  Spoke with Lurena Joiner and he suggested patient hold Coreg and see if that works. Per Lurena Joiner if the headaches stop just hold the Coreg until her next appointment.  Patient advised to contact office if the headaches do not get any better. Patient voiced understanding and thanked me for calling.

## 2019-06-30 NOTE — Telephone Encounter (Signed)
Received call from patients home nurse she wanted to know what was going on with patients medications. She states patient was just seen in the office and we sent over rx's to the pharmacy and patient refused to pick up potassium, because it hurts her throat and hard to swallow) and she did not pick up Coreg. She stated the only medication the patient picked up was Spironolactone. I advised Nurse to have patient take Coreg-per Runell Gess instructions and guidance [employer] standing beside me while on the call). I also informed her that patient called earlier and said her head was banging and we advised her to stop Coreg because we were under the impression she had started it. The nurse stated patients blood pressure has been running high and she wanted to know if we could order the patient a blood pressure cuff. I told her I would send a message to our care guide and have them send the patient a cuff. Per Nurse if our care guide could not send the patient a bp cuff she would like for me to send her a order and she will have a medical supply place send the patient a cuff.   Lurena Joiner spoke with home health nurse and they agreed that nurse will try to help patient get compliant with taking her medications as prescribed; and start the Coreg.

## 2019-07-04 ENCOUNTER — Ambulatory Visit: Payer: Medicaid Other | Admitting: Obstetrics and Gynecology

## 2019-07-04 NOTE — Telephone Encounter (Signed)
RE: scale Received: Yesterday Message Contents  Faylene Kurtz, RN        Sent over referral, they should be reaching out shortly to arrange delivery/pickup. Thanks for the referral!   Previous Messages  ----- Message -----  From: Fidel Levy, RN  Sent: 06/23/2019  4:14 PM EDT  To: Antionette Char  Subject: scale                       Hello,   Per epic chart review, this patient needs to take daily weights but cannot obtain one. Can you assist with this?   Thanks :)

## 2019-07-05 ENCOUNTER — Telehealth: Payer: Self-pay | Admitting: Licensed Clinical Social Worker

## 2019-07-05 ENCOUNTER — Ambulatory Visit (HOSPITAL_COMMUNITY)
Admission: RE | Admit: 2019-07-05 | Discharge: 2019-07-05 | Disposition: A | Discharge status: Home / Self Care | Payer: Medicaid Other | Source: Ambulatory Visit | Attending: Obstetrics | Admitting: Obstetrics

## 2019-07-05 ENCOUNTER — Other Ambulatory Visit: Payer: Self-pay

## 2019-07-05 DIAGNOSIS — R102 Pelvic and perineal pain: Secondary | ICD-10-CM | POA: Diagnosis present

## 2019-07-05 NOTE — Telephone Encounter (Signed)
CSW referred to assist patient with obtaining a BP cuff and scale. CSW contacted patient to inform cuff and scale will be delivered to home. Patient grateful for support and assistance.  CSW available as needed. Raquel Sarna, [place], [place]

## 2019-07-06 NOTE — Telephone Encounter (Signed)
Patient instructions for 06/28/2019 OV with Rosalyn Gess, PA-C included the following:  Patient Instructions  Medication Instructions:  DECREASE Potassium to one tablet once a day START Spironolactone 12.5mg  Take 1 tablet daily (tablet comes in 25mg  take half tablet daily) START Coreg 3.125mg  Take 1 tablet twice a day  STOP Bystolic

## 2019-07-10 ENCOUNTER — Ambulatory Visit (INDEPENDENT_AMBULATORY_CARE_PROVIDER_SITE_OTHER): Payer: Medicaid Other | Admitting: Obstetrics and Gynecology

## 2019-07-10 ENCOUNTER — Other Ambulatory Visit: Payer: Self-pay

## 2019-07-10 ENCOUNTER — Encounter: Payer: Self-pay | Admitting: Obstetrics and Gynecology

## 2019-07-10 VITALS — BP 121/82 | HR 97 | Ht 61.0 in | Wt 201.7 lb

## 2019-07-10 DIAGNOSIS — Z712 Person consulting for explanation of examination or test findings: Secondary | ICD-10-CM | POA: Diagnosis not present

## 2019-07-10 NOTE — Progress Notes (Signed)
58 yo here to discuss results of her ultrasound ordered to evaluate her pelvic pain. Patient states the pain is still present and located near her right hip. Patient suffers from arthritis and states that this feels like arthritic pain  Past Medical History:  Diagnosis Date  . Anxiety   . Arthritis   . Asthma   . Barrett's esophagus   . Chest pain   . Collagen vascular disease ([place])   . Colon polyps   . Coronary artery disease   . Depression   . Dyslipidemia   . GERD (gastroesophageal reflux disease)    occ  . Hypertension   . Myocardial infarct ([place]) [id number]  . Sciatica   . Sleep apnea    no CPAP ordered but using oxygen at bedtime  . Tobacco abuse    Past Surgical History:  Procedure Laterality Date  . CARDIAC CATHETERIZATION  10/09/2003   [place], [place]) - LAD with 30% prox narrowing, 50% stenosis in mid-portion of PLA; RCA with 40% narrowing proximally ([name], [name])  . CARDIAC CATHETERIZATION  10/10/2007   70% stenosis in first septal perforator branch of LAD, 60-70% narrowing in mid LAD, 20% narrowing in mid AV groove Cfx with 80% diffuse narrowing in small distal marginal, total occlusion of mid RCA with L to R collaterals, 90% stenosis diffusely in prox branch of RCA followed by 70% stenosis in secondary curve & 80% stenosis in small marginal branch ([name])  . CARDIAC CATHETERIZATION  05/28/2008   normal L main, RCA with 100% prox lesion w/distal filling from LAD collaterals, LAD with 20% prox tubular lesion/ 40% mid LAD lesion/previous stent patent ([name])  . CARDIAC CATHETERIZATION  09/15/2009   discrete 100% osital RCA lesion, 50% prox LAD lesion, non-obstructive disease in all coronaries ([name])  . CESAREAN SECTION    . COLONOSCOPY    . COLONOSCOPY W/ POLYPECTOMY    . CORONARY ANGIOPLASTY WITH STENT PLACEMENT  10/31/2007   PCI of distal Cfx with 2.25x66mm Taxus Adam DES, 60% narrowing of mid LAD ([name])  .  CORONARY ANGIOPLASTY WITH STENT PLACEMENT  06/11/2011   PCI of prox-mid LAD with 3x59mm DES Resolute ([name])  . CORONARY STENT INTERVENTION N/A 06/19/2019   Procedure: CORONARY STENT INTERVENTION;  Surgeon: [name], MD;  Location: [facility];  Service: Cardiovascular;  Laterality: N/A;  LAD  . DILATION AND CURETTAGE, DIAGNOSTIC / THERAPEUTIC    . DILITATION & CURRETTAGE/HYSTROSCOPY WITH HYDROTHERMAL ABLATION N/A 04/03/2016   Procedure: DILATATION & CURETTAGE/HYSTEROSCOPY WITH HYDROTHERMAL ABLATION;  Surgeon: [name], MD;  Location: [facility];  Service: Gynecology;  Laterality: N/A;  . NM MYOCAR PERF WALL MOTION  08/2009   persantine myoview - normal perfusion in all regions, perfusion defect in anterior region (breast attenuation), EF 52%, low risk scan  . RIGHT/LEFT HEART CATH AND CORONARY ANGIOGRAPHY N/A 06/15/2019   Procedure: RIGHT/LEFT HEART CATH AND CORONARY ANGIOGRAPHY;  Surgeon: Belva Crome, MD;  Location: [facility];  Service: Cardiovascular;  Laterality: N/A;   Family History  Problem Relation Age of Onset  . Leukemia Mother   . Clotting disorder Father        blood clot  . Hypertension Sister   . Diabetes Sister   . Stroke Sister 13  . Bleeding Disorder Son        ITP "free bleeding disorder"  . Colon cancer Paternal Grandmother   . Diabetes  Paternal Grandmother   . Stomach cancer Neg Hx   . Pancreatic cancer Neg Hx    Social History   Tobacco Use  . Smoking status: Former Smoker    Packs/day: 0.50    Years: 0.00    Pack years: 0.00    Types: Cigarettes  . Smokeless tobacco: Never Used  Substance Use Topics  . Alcohol use: No    Alcohol/week: 0.0 standard drinks    Comment: seldom  . Drug use: Yes    Types: Marijuana    Comment: used last night   ROS See pertinent in HPI  Blood pressure 121/82, pulse 97, height 5\' 1"  (1.549 m), weight 201 lb 11.2 oz (91.5 kg).  GENERAL: Well-developed, well-nourished female in no acute  distress.  EXTREMITIES: No cyanosis, clubbing, or edema, 2+ distal pulses.  Dg Chest 2 View  Result Date: 06/11/2019 CLINICAL DATA:  Increased wheezing for 1-2 days. EXAM: CHEST - 2 VIEW COMPARISON:  Chest x-ray dated 09/05/2018. FINDINGS: Borderline cardiomegaly, stable. Overall cardiomediastinal silhouette is stable. Lungs are clear. No pleural effusion or pneumothorax seen. Osseous structures about the chest are unremarkable. IMPRESSION: No active cardiopulmonary disease. No evidence of pneumonia or pulmonary edema. Electronically Signed   By: Franki Cabot M.D.   On: 06/11/2019 11:15   Ct Angio Chest Pe W Or Wo Contrast  Result Date: 06/12/2019 CLINICAL DATA:  Shortness of breath. EXAM: CT ANGIOGRAPHY CHEST WITH CONTRAST TECHNIQUE: Multidetector CT imaging of the chest was performed using the standard protocol during bolus administration of intravenous contrast. Multiplanar CT image reconstructions and MIPs were obtained to evaluate the vascular anatomy. CONTRAST:  155mL OMNIPAQUE IOHEXOL 350 MG/ML SOLN COMPARISON:  Radiograph earlier this day. FINDINGS: Cardiovascular: There are no filling defects within the pulmonary arteries to suggest pulmonary embolus. Thoracic aorta is normal in caliber. Cannot assess for dissection given phase of contrast, no perivascular stranding. Multi chamber cardiomegaly. Coronary artery calcifications/stents. Contrast refluxes into the hepatic veins and IVC. No pericardial effusion. Mediastinum/Nodes: Small reactive appearing mediastinal and hilar lymph nodes. The esophagus is decompressed. No thyroid nodule. Lungs/Pleura: Bilateral ground-glass opacities, central predominant, consistent with pulmonary edema. Mild smooth septal thickening, also pulmonary edema. Small bilateral pleural effusions, right greater than left. Prominent lower lobe peribronchial thickening which may be congestive. Upper Abdomen: Contrast refluxing into the hepatic veins and IVC. Hepatic steatosis.  Probable left adrenal adenomas. Musculoskeletal: There are no acute or suspicious osseous abnormalities. Review of the MIP images confirms the above findings. IMPRESSION: 1. No pulmonary embolus. 2. CHF with cardiomegaly, pulmonary edema, and small pleural effusions. Elevated right heart pressures with contrast refluxing into the hepatic veins and IVC. 3. Coronary artery calcifications. Aortic Atherosclerosis (ICD10-I70.0). Electronically Signed   By: Keith Rake M.D.   On: 06/12/2019 22:38   US Renal  Result Date: 06/15/2019 CLINICAL DATA:  Acute kidney injury EXAM: RENAL / URINARY TRACT ULTRASOUND COMPLETE COMPARISON:  CT 04/13/2018 FINDINGS: Right Kidney: Renal measurements: 10.9 x 4.4 x 4.7 cm = volume: 117 mL. Three cysts within the right kidney, the largest 3.2 cm in the midpole. These are stable since prior CT. No hydronephrosis. Normal echotexture. Left Kidney: Renal measurements: 10.0 x 5.2 x 4.2 cm = volume: 113 mL. Echogenicity within normal limits. No mass or hydronephrosis visualized. Bladder: Decompressed, not visualized. IMPRESSION: No acute findings.  No hydronephrosis. Electronically Signed   By: Rolm Baptise M.D.   On: 06/15/2019 09:28   Dg Chest Port 1 View  Result Date: 06/16/2019 CLINICAL DATA:  Shortness of breath with edema. EXAM: PORTABLE CHEST 1 VIEW COMPARISON:  06/12/2019 FINDINGS: Lungs are adequately inflated demonstrate improved hazy perihilar density with worsening patchy airspace opacification over the upper lobes right worse than left. Findings are concerning for multifocal pneumonia. No effusions. Stable cardiomegaly. Remainder of the exam is unchanged. IMPRESSION: Worsening patchy bilateral airspace process over the upper lobes right worse than left likely multifocal pneumonia. Stable cardiomegaly. Near resolution of previously noted hazy perihilar opacification. No effusions. Electronically Signed   By: Marin Olp M.D.   On: 06/16/2019 09:57   Dg Chest Port 1  View  Result Date: 06/12/2019 CLINICAL DATA:  Shortness of breath EXAM: PORTABLE CHEST 1 VIEW COMPARISON:  June 11, 2019 FINDINGS: No pneumothorax. Stable cardiomegaly. The hila and mediastinum are unremarkable. Mild diffuse interstitial opacities. More focal opacity in the right base. No other acute abnormalities. IMPRESSION: 1. Cardiomegaly.  Mild pulmonary edema. 2. The more focal opacity in the right base may represent focal infiltrate such as pneumonia or aspiration versus asymmetric edema. Recommend clinical correlation and follow-up to resolution. Electronically Signed   By: Dorise Bullion [name] M.D   On: 06/12/2019 14:48   US Pelvic Complete With Transvaginal  Result Date: 07/05/2019 CLINICAL DATA:  58 year old postmenopausal female with right greater than left pelvic pain and abnormal uterine bleeding. History of uterine fibroid and endometrial ablation. EXAM: TRANSABDOMINAL AND TRANSVAGINAL ULTRASOUND OF PELVIS TECHNIQUE: Both transabdominal and transvaginal ultrasound examinations of the pelvis were performed. Transabdominal technique was performed for global imaging of the pelvis including uterus, ovaries, adnexal regions, and pelvic cul-de-sac. It was necessary to proceed with endovaginal exam following the transabdominal exam to visualize the endometrium and adnexa. COMPARISON:  12/24/2015 pelvic sonogram. FINDINGS: Uterus Measurements: 7.5 x 4.0 x 4.0 cm = volume: 63 mL. Anteverted myomatous uterus, with uterine fibroids as follows: -right uterine body intramural 1.3 x 1.0 x 1.0 cm fibroid, not previously measured -posterior fundal subserosal 2.8 x 2.3 x 2.7 cm fibroid, previously 2.6 x 2.7 x 2.9 cm, not appreciably changed -left anterior lower uterine body subserosal partially calcified 0.9 x 0.5 x 0.9 cm fibroid, not previously measured -right uterine body submucosal 1.6 x 1.0 x 1.1 cm fibroid, not previously measured Endometrium Status post endometrial ablation. Endometrium distorted by  submucosal fibroid. No endometrial cavity fluid or focal endometrial mass demonstrated. Right ovary Nonvisualization of the right ovary.  No right adnexal masses. Left ovary Nonvisualization of the left ovary.  No left adnexal masses. Other findings No abnormal free fluid. IMPRESSION: 1. Myomatous uterus as detailed, including a 1.6 cm right uterine body submucosal fibroid, which distorts the ablated endometrial cavity. 2. Nonvisualization of the ovaries bilaterally.  No adnexal masses. Electronically Signed   By: Ilona Sorrel M.D.   On: 07/05/2019 19:22   Vas [place] Lower Extremity Venous (dvt)  Result Date: 06/12/2019  Lower Venous Study Indications: SOB.  Limitations: Patient positioning. Comparison Study: no prior Performing Technologist: Abram Sander RVS  Examination Guidelines: A complete evaluation includes B-mode imaging, spectral Doppler, color Doppler, and power Doppler as needed of all accessible portions of each vessel. Bilateral testing is considered an integral part of a complete examination. Limited examinations for reoccurring indications may be performed as noted.  +---------+---------------+---------+-----------+----------+-------+ RIGHT    CompressibilityPhasicitySpontaneityPropertiesSummary +---------+---------------+---------+-----------+----------+-------+ CFV      Full           Yes      Yes                          +---------+---------------+---------+-----------+----------+-------+  SFJ      Full                                                 +---------+---------------+---------+-----------+----------+-------+ FV Prox  Full                                                 +---------+---------------+---------+-----------+----------+-------+ FV Mid   Full                                                 +---------+---------------+---------+-----------+----------+-------+ FV DistalFull                                                  +---------+---------------+---------+-----------+----------+-------+ PFV      Full                                                 +---------+---------------+---------+-----------+----------+-------+ POP      Full           Yes      Yes                          +---------+---------------+---------+-----------+----------+-------+ PTV      Full                                                 +---------+---------------+---------+-----------+----------+-------+ PERO     Full                                                 +---------+---------------+---------+-----------+----------+-------+   +---------+---------------+---------+-----------+----------+-------+ LEFT     CompressibilityPhasicitySpontaneityPropertiesSummary +---------+---------------+---------+-----------+----------+-------+ CFV      Full           Yes      Yes                          +---------+---------------+---------+-----------+----------+-------+ SFJ      Full                                                 +---------+---------------+---------+-----------+----------+-------+ FV Prox  Full                                                 +---------+---------------+---------+-----------+----------+-------+  FV Mid   Full                                                 +---------+---------------+---------+-----------+----------+-------+ FV DistalFull                                                 +---------+---------------+---------+-----------+----------+-------+ PFV      Full                                                 +---------+---------------+---------+-----------+----------+-------+ POP      Full           Yes      Yes                          +---------+---------------+---------+-----------+----------+-------+ PTV      Full                                                 +---------+---------------+---------+-----------+----------+-------+ PERO     Full                                                  +---------+---------------+---------+-----------+----------+-------+     Summary: Right: There is no evidence of deep vein thrombosis in the lower extremity. No cystic structure found in the popliteal fossa. Left: There is no evidence of deep vein thrombosis in the lower extremity. No cystic structure found in the popliteal fossa.  *See table(s) above for measurements and observations. Electronically signed by Servando Snare MD on 06/12/2019 at 3:53:02 PM.    Final      A/P 58 yo with small fibroid uterus s/p endometrial ablation - Pelvic ultrasound report reviewed with the patient - No clear evidence of etiology of pain - Advised patient to follow up with PCP - RTC prn

## 2019-07-10 NOTE — Progress Notes (Signed)
Patient is in the office to discuss U/S results from 07-05-19.

## 2019-07-18 ENCOUNTER — Encounter: Payer: Self-pay | Admitting: Cardiology

## 2019-07-18 ENCOUNTER — Ambulatory Visit (INDEPENDENT_AMBULATORY_CARE_PROVIDER_SITE_OTHER): Payer: Medicaid Other | Admitting: Cardiology

## 2019-07-18 ENCOUNTER — Telehealth: Payer: Self-pay

## 2019-07-18 ENCOUNTER — Other Ambulatory Visit: Payer: Self-pay

## 2019-07-18 VITALS — BP 127/90 | HR 99 | Ht 61.0 in | Wt 202.0 lb

## 2019-07-18 DIAGNOSIS — I2510 Atherosclerotic heart disease of native coronary artery without angina pectoris: Secondary | ICD-10-CM

## 2019-07-18 DIAGNOSIS — N183 Chronic kidney disease, stage 3 unspecified: Secondary | ICD-10-CM | POA: Insufficient documentation

## 2019-07-18 DIAGNOSIS — E785 Hyperlipidemia, unspecified: Secondary | ICD-10-CM | POA: Diagnosis not present

## 2019-07-18 DIAGNOSIS — G894 Chronic pain syndrome: Secondary | ICD-10-CM | POA: Diagnosis not present

## 2019-07-18 DIAGNOSIS — Z9861 Coronary angioplasty status: Secondary | ICD-10-CM

## 2019-07-18 DIAGNOSIS — I5021 Acute systolic (congestive) heart failure: Secondary | ICD-10-CM

## 2019-07-18 DIAGNOSIS — I255 Ischemic cardiomyopathy: Secondary | ICD-10-CM | POA: Diagnosis not present

## 2019-07-18 DIAGNOSIS — N289 Disorder of kidney and ureter, unspecified: Secondary | ICD-10-CM

## 2019-07-18 DIAGNOSIS — J439 Emphysema, unspecified: Secondary | ICD-10-CM

## 2019-07-18 DIAGNOSIS — G8929 Other chronic pain: Secondary | ICD-10-CM | POA: Insufficient documentation

## 2019-07-18 DIAGNOSIS — E669 Obesity, unspecified: Secondary | ICD-10-CM

## 2019-07-18 MED ORDER — BUPROPION HCL ER (XL) 150 MG PO TB24: 150.0000 mg | ORAL_TABLET | Freq: Every day | ORAL | 2 refills | Status: DC

## 2019-07-18 NOTE — Assessment & Plan Note (Signed)
Pt admitted 06/11/2019 with CHF and new LVD- EF 25%. Discharge wgt 199 lbs 

## 2019-07-18 NOTE — Assessment & Plan Note (Signed)
BMI 37  

## 2019-07-18 NOTE — Assessment & Plan Note (Signed)
Current smoker- home O2 PRN- she wants Welbutrin to see if she can quit

## 2019-07-18 NOTE — Assessment & Plan Note (Signed)
LDL 131 July 2020- discharged on high dose statin Rx 

## 2019-07-18 NOTE — Assessment & Plan Note (Signed)
[facility] [place] [facility] '08-Dr Kelly LAD PCI/DES 2012- Dr Kelly pLAD PCI with DES 06/19/2019- Dr Berry after patient turned down for CABG. Known occluded RCA with L-R collaterals, 65% mCFX-EF 25% 

## 2019-07-18 NOTE — Assessment & Plan Note (Signed)
Check BMP- if stable transition to [facility]

## 2019-07-18 NOTE — Assessment & Plan Note (Signed)
New LVD July 2020-EF 25% Check echo in two months

## 2019-07-18 NOTE — Patient Instructions (Addendum)
Medication Instructions:  START Wellbutrin 150mg  Take 1 tablet once a day  If you need a refill on your cardiac medications before your next appointment, please call your pharmacy.   Lab work: Your physician recommends that you return for lab work in: TODAY-BMET Your physician recommends that you return for lab work in: [place] ECHO FASTING-LIPID, CMET If you have labs (blood work) drawn today and your tests are completely normal, you will receive your results only by: Marland Kitchen MyChart Message (if you have MyChart) OR . A paper copy in the mail If you have any lab test that is abnormal or we need to change your treatment, we will call you to review the results.  Testing/Procedures: Your physician has requested that you have an echocardiogram. Echocardiography is a painless test that uses sound waves to create images of your heart. It provides your doctor with information about the size and shape of your heart and how well your heart's chambers and valves are working. This procedure takes approximately one hour. There are no restrictions for this procedure. [place] TEST FOR 2 MONTHS OUT   Follow-Up: At [facility], you and your health needs are our priority.  As part of our continuing mission to provide you with exceptional heart care, we have created designated Provider Care Teams.  These Care Teams include your primary Cardiologist (physician) and Advanced Practice Providers (APPs -  Physician Assistants and Nurse Practitioners) who all work together to provide you with the care you need, when you need it. You will need a follow up appointment in 2 months OR AFTER ECHO.  Please call our office 2 months in advance to schedule this appointment.  You may see Quay Burow, MD or one of the following Advanced Practice Providers on your designated Care Team:   Kerin Ransom, PA-C Roby Lofts, [place] . Sande Rives, PA-C  Any Other Special Instructions Will Be  Listed Below (If Applicable).

## 2019-07-18 NOTE — Progress Notes (Signed)
Cardiology Office Note:    Date:  07/18/2019   ID:  Tracey Manning, DOB Apr 06, 1961, MRN [id number]  PCP:  Tracey Ebbs, MD  Cardiologist:  Tracey Burow, MD  Electrophysiologist:  None   Referring MD: Tracey Ebbs, MD   No chief complaint on file.   History of Present Illness:    Tracey Manning is a 58 y.o. female with a hx of CAD. She had a remote PCI in 2004 in [place].  In 2008 she had a circumflex PCI by Tracey Manning.  She had known occluded RCA with left-to-right collaterals.  In 2012 she had an LAD stent placed again by Tracey Manning.   She was admitted 06/11/2019 with acute respiratory failure and unstable angina.  She had new LV dysfunction with an EF of 25%.  She had acute kidney injury as well with a creatinine of 1.8.  Patient was admitted, diuresed, and underwent diagnostic catheterization 06/15/2019.  This revealed a 60 to 70% mid circumflex stenosis, known occluded RCA with left-to-right collaterals, and a 95% proximal LAD before the previously placed stent which was 50 to 60% narrowed.  After review the patient was turned down for CABG.  She underwent staged intervention to her LAD on 06/19/2019 by Tracey Manning.   I saw her in the office 06/28/2019.  She told me she could not afford Bystolic and had headaches with Metoprolol in the past (h/o migraines).  I suggested Coreg and added Aldactone 12.5 mg and stopped her K+ supplement.  The plan was to see her back and consider transition to [facility].  The patient returns today for follow up.  She has multiple pain complaints including her Rt radial site and her Lt shoulder and arm.  She does go to a pain clinic.  She also says she could not tolerate Coreg- "headache".  She has not had recurrent chest pain as on admission.  She denies unusual dyspnea.  She is able to take her current medications but cost is an issue.   Past Medical History:  Diagnosis Date  . Anxiety   . Arthritis   . Asthma   . Barrett's esophagus   . Chest  pain   . Collagen vascular disease ([place])   . Colon polyps   . Coronary artery disease   . Depression   . Dyslipidemia   . GERD (gastroesophageal reflux disease)    occ  . Hypertension   . Myocardial infarct ([place]) 8921,1941  . Sciatica   . Sleep apnea    no CPAP ordered but using oxygen at bedtime  . Tobacco abuse     Past Surgical History:  Procedure Laterality Date  . CARDIAC CATHETERIZATION  10/09/2003   [facility] [place], [place]) - LAD with 30% prox narrowing, 50% stenosis in mid-portion of PLA; RCA with 40% narrowing proximally (Tracey Manning)  . CARDIAC CATHETERIZATION  10/10/2007   70% stenosis in first septal perforator branch of LAD, 60-70% narrowing in mid LAD, 20% narrowing in mid AV groove Cfx with 80% diffuse narrowing in small distal marginal, total occlusion of mid RCA with L to R collaterals, 90% stenosis diffusely in prox branch of RCA followed by 70% stenosis in secondary curve & 80% stenosis in small marginal branch (Tracey Manning)  . CARDIAC CATHETERIZATION  05/28/2008   normal L main, RCA with 100% prox lesion w/distal filling from LAD collaterals, LAD with 20% prox tubular lesion/ 40% mid LAD lesion/previous stent patent (Tracey Manning)  . CARDIAC CATHETERIZATION  09/15/2009   discrete 100% osital RCA lesion, 50% prox LAD lesion, non-obstructive disease in all coronaries (Tracey Manning)  . CESAREAN SECTION    . COLONOSCOPY    . COLONOSCOPY W/ POLYPECTOMY    . CORONARY ANGIOPLASTY WITH STENT PLACEMENT  10/31/2007   PCI of distal Cfx with 2.25x83m Taxus Adam DES, 60% narrowing of mid LAD (Dr. TCorky Manning  . CORONARY ANGIOPLASTY WITH STENT PLACEMENT  06/11/2011   PCI of prox-mid LAD with 3x133mDES Resolute (Tracey Manning . CORONARY STENT INTERVENTION N/A 06/19/2019   Procedure: CORONARY STENT INTERVENTION;  Surgeon: Tracey Manning;  [facility];  Service: Cardiovascular;  Laterality: N/A;  LAD  . DILATION AND CURETTAGE, DIAGNOSTIC  / THERAPEUTIC    . DILITATION & CURRETTAGE/HYSTROSCOPY WITH HYDROTHERMAL ABLATION N/A 04/03/2016   Procedure: DILATATION & CURETTAGE/HYSTEROSCOPY WITH HYDROTHERMAL ABLATION;  Surgeon: Tracey Manning;  Location: [facility];  Service: Gynecology;  Laterality: N/A;  . NM MYOCAR PERF WALL MOTION  08/2009   persantine myoview - normal perfusion in all regions, perfusion defect in anterior region (breast attenuation), EF 52%, low risk scan  . RIGHT/LEFT HEART CATH AND CORONARY ANGIOGRAPHY N/A 06/15/2019   Procedure: RIGHT/LEFT HEART CATH AND CORONARY ANGIOGRAPHY;  Surgeon: Tracey Manning;  [facility];  Service: Cardiovascular;  Laterality: N/A;    Current Medications: Current Meds  Medication Sig  . albuterol (PROVENTIL HFA;VENTOLIN HFA) 108 (90 BASE) MCG/ACT inhaler Inhale 2 puffs into the lungs every 6 (six) hours as needed for wheezing or shortness of breath.  . Marland Kitchenlbuterol (PROVENTIL) (2.5 MG/3ML) 0.083% nebulizer solution Take 6 mLs by nebulization every 6 (six) hours as needed for wheezing or shortness of breath.  . Marland Kitchenspirin EC 81 MG EC tablet Take 1 tablet (81 mg total) by mouth daily.  . Marland Kitchentorvastatin (LIPITOR) 80 MG tablet Take 1 tablet (80 mg total) by mouth daily.  . cetirizine (ZYRTEC) 10 MG tablet Take 10 mg by mouth daily as needed for allergies.  . Marland Kitchenlopidogrel (PLAVIX) 75 MG tablet Take 1 tablet (75 mg total) by mouth daily with breakfast.  . dicyclomine (BENTYL) 20 MG tablet Take 1 tablet (20 mg total) by mouth 3 (three) times daily before meals.  . fluticasone-salmeterol (ADVAIR HFA) 115-21 MCG/ACT inhaler Inhale 2 puffs into the lungs 2 (two) times daily.  . furosemide (LASIX) 40 MG tablet Take 1 tablet (40 mg total) by mouth daily.  . hydrALAZINE (APRESOLINE) 50 MG tablet Take 1 tablet (50 mg total) by mouth every 8 (eight) hours.  . isosorbide mononitrate (IMDUR) 30 MG 24 hr tablet Take 1 tablet (30 mg total) by mouth daily.  . Marland Kitchenosartan (COZAAR) 50 MG tablet  Take 1 tablet (50 mg total) by mouth daily.  . Marland KitchenARCAN 4 MG/0.1ML LIQD nasal spray kit Place 1 spray into the nose once.   . neomycin-polymyxin b-dexamethasone (MAXITROL) 3.5-10000-0.1 SUSP Place 1 drop into the left eye 2 (two) times daily.  . nitroGLYCERIN (NITROSTAT) 0.4 MG SL tablet Place 1 tablet (0.4 mg total) under the tongue every 5 (five) minutes as needed for chest pain.  . Marland KitchenxyCODONE (OXY IR/ROXICODONE) 5 MG immediate release tablet Take 1 tablet (5 mg total) by mouth daily as needed for severe pain or breakthrough pain.  . pantoprazole (PROTONIX) 20 MG tablet Take 2 tablets (40 mg total) by mouth 2 (two) times daily.  . Marland Kitchenpironolactone (ALDACTONE) 25 MG tablet TAKE 1 2 (ONE HALF) TABLET BY MOUTH  ONCE DAILY  . Vitamin D, Ergocalciferol, (DRISDOL) 1.25 MG (50000 UT) CAPS capsule Take 50,000 Units by mouth once a week.  . zolpidem (AMBIEN) 10 MG tablet Take 10 mg by mouth at bedtime as needed for sleep.     Allergies:   Coreg [carvedilol], Other, Amoxicillin, Dilaudid [hydromorphone hcl], Ibuprofen, Latex, and Metoprolol   Social History   Socioeconomic History  . Marital status: Single    Spouse name: Not on file  . Number of children: 2  . Years of education: 57  . Highest education level: Not on file  Occupational History  . Not on file  Social Needs  . Financial resource strain: Not on file  . Food insecurity    Worry: Not on file    Inability: Not on file  . Transportation needs    Medical: Not on file    Non-medical: Not on file  Tobacco Use  . Smoking status: Former Smoker    Packs/day: 0.50    Years: 0.00    Pack years: 0.00    Types: Cigarettes  . Smokeless tobacco: Never Used  Substance and Sexual Activity  . Alcohol use: No    Alcohol/week: 0.0 standard drinks    Comment: seldom  . Drug use: Yes    Types: Marijuana    Comment: used last night  . Sexual activity: Yes    Partners: Male    Birth control/protection: Post-menopausal  Lifestyle  . Physical  activity    Days per week: Not on file    Minutes per session: Not on file  . Stress: Not on file  Relationships  . Social Herbalist on phone: Not on file    Gets together: Not on file    Attends religious service: Not on file    Active member of club or organization: Not on file    Attends meetings of clubs or organizations: Not on file    Relationship status: Not on file  Other Topics Concern  . Not on file  Social History Narrative  . Not on file     Family History: The patient's family history includes Bleeding Disorder in her son; Clotting disorder in her father; Colon cancer in her paternal grandmother; Diabetes in her paternal grandmother and sister; Hypertension in her sister; Leukemia in her mother; Stroke (age of onset: 32) in her sister. There is no history of Stomach cancer or Pancreatic cancer.  ROS:   Please see the history of present illness.     All other systems reviewed and are negative.  EKGs/Labs/Other Studies Reviewed:     Recent Labs: 06/12/2019: B Natriuretic Peptide 600.7 06/14/2019: ALT 46; Magnesium 2.2 06/20/2019: Hemoglobin 12.5; Platelets 280 06/28/2019: BUN 17; Creatinine, Ser 1.23; Potassium 4.4; Sodium 144  Recent Lipid Panel    Component Value Date/Time   CHOL 220 (H) 06/14/2019 0216   TRIG 161 (H) 06/14/2019 0216   HDL 57 06/14/2019 0216   CHOLHDL 3.9 06/14/2019 0216   VLDL 32 06/14/2019 0216   LDLCALC 131 (H) 06/14/2019 0216    Physical Exam:    VS:  BP 127/90   Pulse 99   Ht 5' 1"  (1.549 m)   Wt 202 lb (91.6 kg)   LMP  (LMP Unknown) Comment: verified BEFORE imaging  SpO2 99%   BMI 38.17 kg/m     Wt Readings from Last 3 Encounters:  07/18/19 202 lb (91.6 kg)  07/10/19 201 lb 11.2 oz (91.5 kg)  06/28/19 205 lb (  93 kg)     GEN: Morbidly obese, AA female, well developed in no acute distress HEENT: Normal NECK: No JVD; No carotid bruits LYMPHATICS: No lymphadenopathy CARDIAC: RRR, no murmurs, rubs,  gallops RESPIRATORY:  Clear to auscultation without rales, wheezing or rhonchi  ABDOMEN: Soft, non-tender, non-distended MUSCULOSKELETAL:  No edema; No deformity  SKIN: Warm and dry NEUROLOGIC:  Alert and oriented x 3 PSYCHIATRIC:  Normal affect   ASSESSMENT:    Acute systolic CHF (congestive heart failure) ([place]) Pt admitted 06/11/2019 with CHF and new LVD- EF 25%. Discharge wgt 199 lbs  CAD S/P multiple PCIs PCI in [place] OM PCI '08-Dr Tracey Manning LAD PCI/DES 2012- Dr Tracey Manning pLAD PCI with DES 06/19/2019- Dr Tracey Manning after patient turned down for CABG. Known occluded RCA with L-R collaterals, 65% mCFX-EF 25%  COPD (chronic obstructive pulmonary disease) (HCC) Current smoker- home O2 PRN- she wants Welbutrin to see if she can quit  Ischemic cardiomyopathy New LVD July 2020-EF 25% Check echo in two months  Dyslipidemia, goal LDL below 70 LDL 131 July 2020- discharged on high dose statin Rx  Obesity (BMI 30-39.9) BMI 37  Renal insufficiency Check BMP- if stable transition to Entresto  PLAN:    Check BMP- if stable consider transition to [facility].  Check an echo in 2 months, check CMET, lipids in two months.  She thinks her PCP has access to Bystolic samples and I told her it would be in her best interest if she could take a beta blocker, she seemed to tolerate Bystolic, just couldn't afford it. I did provide her with an Rx for Wellbutrin to see if it will help her quit smoking.    Medication Adjustments/Labs and Tests Ordered: Current medicines are reviewed at length with the patient today.  Concerns regarding medicines are outlined above.  Orders Placed This Encounter  Procedures  . Basic Metabolic Panel (BMET)  . Comprehensive Metabolic Panel (CMET)  . Lipid panel  . ECHOCARDIOGRAM COMPLETE   Meds ordered this encounter  Medications  . buPROPion (WELLBUTRIN XL) 150 MG 24 hr tablet    Sig: Take 1 tablet (150 mg total) by mouth daily. FOR SMOKING    Dispense:  30  tablet    Refill:  2    Patient Instructions  Medication Instructions:  START Wellbutrin 135m Take 1 tablet once a day  If you need a refill on your cardiac medications before your next appointment, please call your pharmacy.   Lab work: Your physician recommends that you return for lab work in: TODAY-BMET Your physician recommends that you return for lab work in: [place]LIP[place], CMET If you have labs (blood work) drawn today and your tests are completely normal, you will receive your results only by: .Marland KitchenMyChart Message (if you have MyChart) OR . A paper copy in the mail If you have any lab test that is abnormal or we need to change your treatment, we will call you to review the results.  Testing/Procedures: Your physician has requested that you have an echocardiogram. Echocardiography is a painless test that uses sound waves to create images of your heart. It provides your doctor with information about the size and shape of your heart and how well your heart's chambers and valves are working. This procedure takes approximately one hour. There are no restrictions for this procedure. 1[place] FOR 2 MONTHS OUT   Follow-Up: [facility] and your health needs are  our priority.  As part of our continuing mission to provide you with exceptional heart care, we have created designated Provider Care Teams.  These Care Teams include your primary Cardiologist (physician) and Advanced Practice Providers (APPs -  Physician Assistants and Nurse Practitioners) who all work together to provide you with the care you need, when you need it. You will need a follow up appointment in 2 months OR AFTER ECHO.  Please call our office 2 months in advance to schedule this appointment.  You may see Tracey Burow, MD or one of the following Advanced Practice Providers on your designated Care Team:   Kerin Ransom, PA-C Roby Lofts, [place] . Sande Rives, PA-C  Any Other Special Instructions Will Be Listed Below (If Applicable).      Angelena Form, PA-C  07/18/2019 4:42 PM    [facility]

## 2019-07-19 LAB — BASIC METABOLIC PANEL
BUN/Creatinine Ratio: 17 (ref 9–23)
BUN: 18 mg/dL (ref 6–24)
CO2: 25 mmol/L (ref 20–29)
Calcium: 9.8 mg/dL (ref 8.7–10.2)
Chloride: 99 mmol/L (ref 96–106)
Creatinine, Ser: 1.07 mg/dL — ABNORMAL HIGH (ref 0.57–1.00)
GFR calc Af Amer: 66 mL/min/{1.73_m2} (ref >59)
GFR calc non Af Amer: 57 mL/min/{1.73_m2} — ABNORMAL LOW (ref >59)
Glucose: 112 mg/dL — ABNORMAL HIGH (ref 65–99)
Potassium: 3.6 mmol/L (ref 3.5–5.2)
Sodium: 139 mmol/L (ref 134–144)

## 2019-07-20 ENCOUNTER — Other Ambulatory Visit: Payer: Self-pay

## 2019-07-20 ENCOUNTER — Ambulatory Visit
Admission: RE | Admit: 2019-07-20 | Discharge: 2019-07-20 | Disposition: A | Discharge status: Home / Self Care | Payer: Medicaid Other | Source: Ambulatory Visit | Attending: Obstetrics | Admitting: Obstetrics

## 2019-07-20 DIAGNOSIS — Z1231 Encounter for screening mammogram for malignant neoplasm of breast: Secondary | ICD-10-CM

## 2019-07-20 DIAGNOSIS — Z01419 Encounter for gynecological examination (general) (routine) without abnormal findings: Secondary | ICD-10-CM

## 2019-07-21 ENCOUNTER — Telehealth (HOSPITAL_COMMUNITY): Payer: Self-pay

## 2019-07-21 ENCOUNTER — Telehealth: Payer: Self-pay

## 2019-07-21 NOTE — Telephone Encounter (Signed)
8/28-Pt cardiac rehab form faxed to [facility] at [phone]

## 2019-07-21 NOTE — Telephone Encounter (Signed)
Called patient to see if she was interested in participating in the Cardiac Rehab Program. Patient stated yes. Patient will come in for orientation on 07/25/2019 @ 130PM and will attend the 915AM exercise class.

## 2019-07-23 ENCOUNTER — Telehealth (HOSPITAL_COMMUNITY): Payer: Self-pay | Admitting: Pharmacist

## 2019-07-24 ENCOUNTER — Telehealth (HOSPITAL_COMMUNITY): Payer: Self-pay | Admitting: *Deleted

## 2019-07-24 NOTE — Telephone Encounter (Signed)
Cardiac Rehab Medication Review by a Pharmacist  Does the patient  feel that his/her medications are working for him/her?  yes  Has the patient been experiencing any side effects to the medications prescribed?  no  Does the patient measure his/her own blood pressure or blood glucose at home?  Yes, most recent BP this morning was 147/98  Does the patient have any problems obtaining medications due to transportation or finances?   no  Understanding of regimen: excellent Understanding of indications: excellent Potential of compliance: excellent   Tracey Manning, PharmD PGY1 [place] Resident [phone] 07/24/2019 3:56 PM

## 2019-07-25 ENCOUNTER — Encounter (HOSPITAL_COMMUNITY)
Admission: RE | Admit: 2019-07-25 | Discharge: 2019-07-25 | Disposition: A | Discharge status: Home / Self Care | Payer: Medicaid Other | Source: Ambulatory Visit | Attending: Cardiovascular Disease | Admitting: Cardiovascular Disease

## 2019-07-25 ENCOUNTER — Telehealth (HOSPITAL_COMMUNITY): Payer: Self-pay | Admitting: *Deleted

## 2019-07-25 ENCOUNTER — Other Ambulatory Visit: Payer: Self-pay

## 2019-07-25 ENCOUNTER — Encounter (HOSPITAL_COMMUNITY): Payer: Self-pay

## 2019-07-25 VITALS — Ht 62.0 in | Wt 202.8 lb

## 2019-07-25 DIAGNOSIS — Z955 Presence of coronary angioplasty implant and graft: Secondary | ICD-10-CM | POA: Insufficient documentation

## 2019-07-25 HISTORY — DX: Hyperlipidemia, unspecified: E78.5

## 2019-07-25 NOTE — Progress Notes (Signed)
Cardiac Individual Treatment Plan  Patient Details  Name: Tracey Manning MRN: [id number] Date of Birth: [birth year] Referring Provider:     CARDIAC REHAB PHASE II ORIENTATION from 07/25/2019 in [facility]  Referring Provider  Dr. Gwenlyn Found      Initial Encounter Date:    CARDIAC REHAB PHASE II ORIENTATION from 07/25/2019 in [facility]  Date  07/25/19      Visit Diagnosis: S/P coronary artery stent placement 06/19/19 [facility]  Patient's Home Medications on Admission:  Current Outpatient Medications:  .  albuterol (PROVENTIL HFA;VENTOLIN HFA) 108 (90 BASE) MCG/ACT inhaler, Inhale 2 puffs into the lungs every 6 (six) hours as needed for wheezing or shortness of breath., Disp: , Rfl:  .  albuterol (PROVENTIL) (2.5 MG/3ML) 0.083% nebulizer solution, Take 6 mLs by nebulization every 6 (six) hours as needed for wheezing or shortness of breath., Disp: , Rfl:  .  aspirin EC 81 MG EC tablet, Take 1 tablet (81 mg total) by mouth daily., Disp: 60 tablet, Rfl: 1 .  atorvastatin (LIPITOR) 80 MG tablet, Take 1 tablet (80 mg total) by mouth daily., Disp: 60 tablet, Rfl: 0 .  buPROPion (WELLBUTRIN XL) 150 MG 24 hr tablet, Take 1 tablet (150 mg total) by mouth daily. FOR SMOKING, Disp: 30 tablet, Rfl: 2 .  cetirizine (ZYRTEC) 10 MG tablet, Take 10 mg by mouth daily as needed for allergies., Disp: , Rfl:  .  clopidogrel (PLAVIX) 75 MG tablet, Take 1 tablet (75 mg total) by mouth daily with breakfast., Disp: 60 tablet, Rfl: 1 .  dicyclomine (BENTYL) 20 MG tablet, Take 1 tablet (20 mg total) by mouth 3 (three) times daily before meals. (Patient not taking: Reported on 07/24/2019), Disp: 90 tablet, Rfl: 2 .  fluticasone-salmeterol (ADVAIR HFA) 115-21 MCG/ACT inhaler, Inhale 2 puffs into the lungs 2 (two) times daily., Disp: 1 Inhaler, Rfl: 12 .  furosemide (LASIX) 40 MG tablet, Take 1 tablet (40 mg total) by mouth daily., Disp: 60 tablet, Rfl: 0 .   hydrALAZINE (APRESOLINE) 50 MG tablet, Take 1 tablet (50 mg total) by mouth every 8 (eight) hours., Disp: 90 tablet, Rfl: 1 .  isosorbide mononitrate (IMDUR) 30 MG 24 hr tablet, Take 1 tablet (30 mg total) by mouth daily., Disp: 30 tablet, Rfl: 1 .  lidocaine (XYLOCAINE) 2 % solution, Use as directed 3 mLs in the mouth or throat 3 (three) times daily before meals. Swish and swallow 3 mLs before meals as needed, Disp: , Rfl:  .  losartan (COZAAR) 50 MG tablet, Take 1 tablet (50 mg total) by mouth daily., Disp: 60 tablet, Rfl: 0 .  NARCAN 4 MG/0.1ML LIQD nasal spray kit, Place 1 spray into the nose once. , Disp: , Rfl:  .  nebivolol (BYSTOLIC) 10 MG tablet, Take 20 mg by mouth daily., Disp: , Rfl:  .  neomycin-polymyxin b-dexamethasone (MAXITROL) 3.5-10000-0.1 SUSP, Place 1 drop into the left eye 2 (two) times daily., Disp: , Rfl:  .  nitroGLYCERIN (NITROSTAT) 0.4 MG SL tablet, Place 1 tablet (0.4 mg total) under the tongue every 5 (five) minutes as needed for chest pain. (Patient not taking: Reported on 07/24/2019), Disp: 25 tablet, Rfl: 6 .  oxyCODONE (OXY IR/ROXICODONE) 5 MG immediate release tablet, Take 1 tablet (5 mg total) by mouth daily as needed for severe pain or breakthrough pain. (Patient not taking: Reported on 07/24/2019), Disp: 5 tablet, Rfl: 0 .  pantoprazole (PROTONIX) 20 MG tablet, Take 2  tablets (40 mg total) by mouth 2 (two) times daily. (Patient not taking: Reported on 07/24/2019), Disp: 30 tablet, Rfl: 0 .  spironolactone (ALDACTONE) 25 MG tablet, TAKE 1 2 (ONE HALF) TABLET BY MOUTH ONCE DAILY, Disp: , Rfl:  .  tiZANidine (ZANAFLEX) 4 MG tablet, Take 4 mg by mouth 2 (two) times daily as needed for muscle spasms., Disp: , Rfl:  .  Vitamin D, Ergocalciferol, (DRISDOL) 1.25 MG (50000 UT) CAPS capsule, Take 50,000 Units by mouth once a week., Disp: , Rfl:  .  zolpidem (AMBIEN) 10 MG tablet, Take 10 mg by mouth at bedtime as needed for sleep., Disp: , Rfl:   Past Medical History: Past  Medical History:  Diagnosis Date  . Anxiety   . Arthritis   . Asthma   . Barrett's esophagus   . Chest pain   . Collagen vascular disease ([place])   . Colon polyps   . Coronary artery disease   . Depression   . Dyslipidemia   . GERD (gastroesophageal reflux disease)    occ  . Hyperlipidemia   . Hypertension   . Myocardial infarct ([place]) [phone]  . Sciatica   . Sleep apnea    no CPAP ordered but using oxygen at bedtime  . Tobacco abuse     Tobacco Use: Social History   Tobacco Use  Smoking Status Former Smoker  . Packs/day: 0.50  . Years: 34.00  . Pack years: 17.00  . Types: Cigarettes  Smokeless Tobacco Never Used    Labs: Recent Review Flowsheet Data    Labs for ITP Cardiac and Pulmonary Rehab Latest Ref Rng & Units 01/04/2014 06/14/2019 06/15/2019 06/15/2019 06/15/2019   Cholestrol 0 - 200 mg/dL - 220(H) - - -   LDLCALC 0 - 99 mg/dL - 131(H) - - -   HDL >40 mg/dL - 57 - - -   Trlycerides <150 mg/dL - 161(H) - - -   Hemoglobin A1c 4.8 - 5.6 % - 5.9(H) - - -   PHART 7.350 - 7.450 - - - - 7.364   PCO2ART 32.0 - 48.0 mmHg - - - - 47.1   HCO3 20.0 - 28.0 mmol/L - - 28.3(H) 27.7 26.8   TCO2 22 - 32 mmol/L 29 - 30 29 28    O2SAT % - - 68.0 70.0 96.0      Capillary Blood Glucose: Lab Results  Component Value Date   GLUCAP 122 (H) 04/03/2016     Exercise Target Goals: Exercise Program Goal: Individual exercise prescription set using results from initial 6 min walk test and THRR while considering  patient's activity barriers and safety.   Exercise Prescription Goal: Starting with aerobic activity 30 plus minutes a day, 3 days per week for initial exercise prescription. Provide home exercise prescription and guidelines that participant acknowledges understanding prior to discharge.  Activity Barriers & Risk Stratification: Activity Barriers & Cardiac Risk Stratification - 07/25/19 1432      Activity Barriers & Cardiac Risk Stratification   Activity Barriers  Joint  Problems;Deconditioning;Muscular Weakness;Shortness of Breath;Arthritis;Back Problems    Cardiac Risk Stratification  High       6 Minute Walk: 6 Minute Walk    Row Name 07/25/19 1429         6 Minute Walk   Phase  Initial     Distance  868 feet     Walk Time  6 minutes     # of Rest Breaks  1 stopped at 4:25 and started back  at 5:42.     MPH  1.6     METS  2.2     RPE  11     Perceived Dyspnea   1     VO2 Peak  7.94     Symptoms  Yes (comment)     Comments  Arthritis and sciatic nerve pain, 8/10. Increased to 9/10 during walk test     Resting HR  97 bpm     Resting BP  146/90     Resting Oxygen Saturation   97 %     Exercise Oxygen Saturation  during 6 min walk  96 %     Max Ex. HR  112 bpm     Max Ex. BP  122/70     2 Minute Post BP  118/70        Oxygen Initial Assessment:   Oxygen Re-Evaluation:   Oxygen Discharge (Final Oxygen Re-Evaluation):   Initial Exercise Prescription: Initial Exercise Prescription - 07/25/19 1400      Date of Initial Exercise RX and Referring Provider   Date  07/25/19    Referring Provider  Dr. Gwenlyn Found    Expected Discharge Date  09/08/19      Recumbant Bike   Level  1    Watts  10    Minutes  15    METs  2.35      NuStep   Level  2    SPM  75    Minutes  15    METs  2      Prescription Details   Frequency (times per week)  3    Duration  Progress to 30 minutes of continuous aerobic without signs/symptoms of physical distress      Intensity   THRR 40-80% of Max Heartrate  65-130    Ratings of Perceived Exertion  11-13      Progression   Progression  Continue to progress workloads to maintain intensity without signs/symptoms of physical distress.      Resistance Training   Training Prescription  Yes    Weight  3 lbs.     Reps  10-15       Perform Capillary Blood Glucose checks as needed.  Exercise Prescription Changes:   Exercise Comments:   Exercise Goals and Review: Exercise Goals    Row Name  07/25/19 1435             Exercise Goals   Increase Physical Activity  Yes       Intervention  Provide advice, education, support and counseling about physical activity/exercise needs.;Develop an individualized exercise prescription for aerobic and resistive training based on initial evaluation findings, risk stratification, comorbidities and participant's personal goals.       Expected Outcomes  Short Term: Attend rehab on a regular basis to increase amount of physical activity.;Long Term: Add in home exercise to make exercise part of routine and to increase amount of physical activity.;Long Term: Exercising regularly at least 3-5 days a week.       Increase Strength and Stamina  Yes       Intervention  Provide advice, education, support and counseling about physical activity/exercise needs.;Develop an individualized exercise prescription for aerobic and resistive training based on initial evaluation findings, risk stratification, comorbidities and participant's personal goals.       Expected Outcomes  Short Term: Increase workloads from initial exercise prescription for resistance, speed, and METs.;Short Term: Perform resistance training exercises routinely during rehab and add in resistance  training at home;Long Term: Improve cardiorespiratory fitness, muscular endurance and strength as measured by increased METs and functional capacity (6MWT)       Able to understand and use rate of perceived exertion (RPE) scale  Yes       Intervention  Provide education and explanation on how to use RPE scale       Expected Outcomes  Short Term: Able to use RPE daily in rehab to express subjective intensity level;Long Term:  Able to use RPE to guide intensity level when exercising independently       Knowledge and understanding of Target Heart Rate Range (THRR)  Yes       Intervention  Provide education and explanation of THRR including how the numbers were predicted and where they are located for reference        Expected Outcomes  Long Term: Able to use THRR to govern intensity when exercising independently;Short Term: Able to state/look up THRR;Short Term: Able to use daily as guideline for intensity in rehab       Able to check pulse independently  Yes       Intervention  Provide education and demonstration on how to check pulse in carotid and radial arteries.;Review the importance of being able to check your own pulse for safety during independent exercise       Expected Outcomes  Short Term: Able to explain why pulse checking is important during independent exercise;Long Term: Able to check pulse independently and accurately       Understanding of Exercise Prescription  Yes       Intervention  Provide education, explanation, and written materials on patient's individual exercise prescription       Expected Outcomes  Short Term: Able to explain program exercise prescription;Long Term: Able to explain home exercise prescription to exercise independently          Exercise Goals Re-Evaluation :    Discharge Exercise Prescription (Final Exercise Prescription Changes):   Nutrition:  Target Goals: Understanding of nutrition guidelines, daily intake of sodium <1559m, cholesterol <2024m calories 30% from fat and 7% or less from saturated fats, daily to have 5 or more servings of fruits and vegetables.  Biometrics: Pre Biometrics - 07/25/19 1435      Pre Biometrics   Height  5' 2"  (1.575 m)    Weight  92 kg    Waist Circumference  44.5 inches    Hip Circumference  49 inches    Waist to Hip Ratio  0.91 %    BMI (Calculated)  37.09    Triceps Skinfold  36 mm    % Body Fat  48.5 %    Grip Strength  28 kg    Flexibility  16 in    Single Leg Stand  4.68 seconds        Nutrition Therapy Plan and Nutrition Goals:   Nutrition Assessments:   Nutrition Goals Re-Evaluation:   Nutrition Goals Discharge (Final Nutrition Goals Re-Evaluation):   Psychosocial: Target Goals: Acknowledge  presence or absence of significant depression and/or stress, maximize coping skills, provide positive support system. Participant is able to verbalize types and ability to use techniques and skills needed for reducing stress and depression.  Initial Review & Psychosocial Screening: Initial Psych Review & Screening - 07/25/19 1344      Initial Review   Current issues with  History of Depression;Current Stress Concerns    Source of Stress Concerns  Chronic Illness;Financial;Transportation;Family    Comments  patient has  multiple stressors and sees a counselor once a week through [place]?  No    Strains  Intra-family strains    Comments  Analleli has a son who is incarcerated. Dwayne is able to speak with her son multiple times a week on the phone      Barriers   Psychosocial barriers to participate in program  The patient should benefit from training in stress management and relaxation.;Psychosocial barriers identified (see note)      Screening Interventions   Interventions  To provide support and resources with identified psychosocial needs;Encouraged to exercise;Provide feedback about the scores to participant    Expected Outcomes  Long Term Goal: Stressors or current issues are controlled or eliminated.;Short Term goal: Identification and review with participant of any Quality of Life or Depression concerns found by scoring the questionnaire.;Long Term goal: The participant improves quality of Life and PHQ9 Scores as seen by post scores and/or verbalization of changes       Quality of Life Scores: Quality of Life - 07/25/19 1441      Quality of Life   Select  Quality of Life      Quality of Life Scores   Health/Function Pre  21.2 %    Socioeconomic Pre  23.75 %    Psych/Spiritual Pre  24.86 %    Family Pre  20.4 %    GLOBAL Pre  22.4 %      Scores of 19 and below usually indicate a poorer quality of life in these areas.  A difference of  2-3  points is a clinically meaningful difference.  A difference of 2-3 points in the total score of the Quality of Life Index has been associated with significant improvement in overall quality of life, self-image, physical symptoms, and general health in studies assessing change in quality of life.  PHQ-9: Recent Review Flowsheet Data    Depression screen [facility] 2/9 07/25/2019 10/25/2017   Decreased Interest 0 0   Down, Depressed, Hopeless 0 0   PHQ - 2 Score 0 0   Altered sleeping - 2   Tired, decreased energy - 0   Change in appetite - 0   Feeling bad or failure about yourself  - 0   Trouble concentrating - 0   Moving slowly or fidgety/restless - 0   Suicidal thoughts - 0   PHQ-9 Score - 2   Difficult doing work/chores - Not difficult at all     Interpretation of Total Score  Total Score Depression Severity:  1-4 = Minimal depression, 5-9 = Mild depression, 10-14 = Moderate depression, 15-19 = Moderately severe depression, 20-27 = Severe depression   Psychosocial Evaluation and Intervention:   Psychosocial Re-Evaluation:   Psychosocial Discharge (Final Psychosocial Re-Evaluation):   Vocational Rehabilitation: Provide vocational rehab assistance to qualifying candidates.   Vocational Rehab Evaluation & Intervention: Vocational Rehab - 07/25/19 1535      Initial Vocational Rehab Evaluation & Intervention   Assessment shows need for Vocational Rehabilitation  No       Education: Education Goals: Education classes will be provided on a weekly basis, covering required topics. Participant will state understanding/return demonstration of topics presented.  Learning Barriers/Preferences: Learning Barriers/Preferences - 07/25/19 1436      Learning Barriers/Preferences   Learning Barriers  Sight    Learning Preferences  Skilled Demonstration;Individual Instruction;Video;Pictoral       Education Topics: Hypertension, Hypertension Reduction -Define heart disease and  high blood  pressure. Discus how high blood pressure affects the body and ways to reduce high blood pressure.   Exercise and Your Heart -Discuss why it is important to exercise, the FITT principles of exercise, normal and abnormal responses to exercise, and how to exercise safely.   Angina -Discuss definition of angina, causes of angina, treatment of angina, and how to decrease risk of having angina.   Cardiac Medications -Review what the following cardiac medications are used for, how they affect the body, and side effects that may occur when taking the medications.  Medications include Aspirin, Beta blockers, calcium channel blockers, ACE Inhibitors, angiotensin receptor blockers, diuretics, digoxin, and antihyperlipidemics.   Congestive Heart Failure -Discuss the definition of CHF, how to live with CHF, the signs and symptoms of CHF, and how keep track of weight and sodium intake.   Heart Disease and Intimacy -Discus the effect sexual activity has on the heart, how changes occur during intimacy as we age, and safety during sexual activity.   Smoking Cessation / COPD -Discuss different methods to quit smoking, the health benefits of quitting smoking, and the definition of COPD.   Nutrition I: Fats -Discuss the types of cholesterol, what cholesterol does to the heart, and how cholesterol levels can be controlled.   Nutrition II: Labels -Discuss the different components of food labels and how to read food label   Heart Parts/Heart Disease and PAD -Discuss the anatomy of the heart, the pathway of blood circulation through the heart, and these are affected by heart disease.   Stress I: Signs and Symptoms -Discuss the causes of stress, how stress may lead to anxiety and depression, and ways to limit stress.   Stress II: Relaxation -Discuss different types of relaxation techniques to limit stress.   Warning Signs of Stroke / TIA -Discuss definition of a stroke, what the signs and  symptoms are of a stroke, and how to identify when someone is having stroke.   Knowledge Questionnaire Score: Knowledge Questionnaire Score - 07/25/19 1437      Knowledge Questionnaire Score   Pre Score  22/28       Core Components/Risk Factors/Patient Goals at Admission: Personal Goals and Risk Factors at Admission - 07/25/19 1534      Core Components/Risk Factors/Patient Goals on Admission    Weight Management  Yes;Obesity;Weight Maintenance    Intervention  Weight Management: Provide education and appropriate resources to help participant work on and attain dietary goals.;Weight Management/Obesity: Establish reasonable short term and long term weight goals.;Obesity: Provide education and appropriate resources to help participant work on and attain dietary goals.    Admit Weight  202 lb 13.2 oz (92 kg)    Tobacco Cessation  Yes    Number of packs per day  5 cigarettes a day    Intervention  Assist the participant in steps to quit. Provide individualized education and counseling about committing to Tobacco Cessation, relapse prevention, and pharmacological support that can be provided by physician.;Advice worker, assist with locating and accessing local/national Quit Smoking programs, and support quit date choice.    Expected Outcomes  Short Term: Will demonstrate readiness to quit, by selecting a quit date.;Short Term: Will quit all tobacco product use, adhering to prevention of relapse plan.;Long Term: Complete abstinence from all tobacco products for at least 12 months from quit date.    Hypertension  Yes    Intervention  Provide education on lifestyle modifcations including regular physical activity/exercise, weight management, moderate sodium restriction and  increased consumption of fresh fruit, vegetables, and low fat dairy, alcohol moderation, and smoking cessation.;Monitor prescription use compliance.    Expected Outcomes  Short Term: Continued assessment and  intervention until BP is < 140/63m HG in hypertensive participants. < 130/826mHG in hypertensive participants with diabetes, heart failure or chronic kidney disease.;Long Term: Maintenance of blood pressure at goal levels.    Lipids  Yes    Intervention  Provide education and support for participant on nutrition & aerobic/resistive exercise along with prescribed medications to achieve LDL <7076mHDL >42m23m  Expected Outcomes  Short Term: Participant states understanding of desired cholesterol values and is compliant with medications prescribed. Participant is following exercise prescription and nutrition guidelines.;Long Term: Cholesterol controlled with medications as prescribed, with individualized exercise RX and with personalized nutrition plan. Value goals: LDL < 70mg21mL > 40 mg.    Stress  Yes    Intervention  Offer individual and/or small group education and counseling on adjustment to heart disease, stress management and health-related lifestyle change. Teach and support self-help strategies.;Refer participants experiencing significant psychosocial distress to appropriate mental health specialists for further evaluation and treatment. When possible, include family members and significant others in education/counseling sessions.    Expected Outcomes  Short Term: Participant demonstrates changes in health-related behavior, relaxation and other stress management skills, ability to obtain effective social support, and compliance with psychotropic medications if prescribed.;Long Term: Emotional wellbeing is indicated by absence of clinically significant psychosocial distress or social isolation.       Core Components/Risk Factors/Patient Goals Review:    Core Components/Risk Factors/Patient Goals at Discharge (Final Review):    ITP Comments: ITP Comments    Row Name 07/25/19 1353 07/25/19 1357         ITP Comments  Dr TraciTressia Miners TraciFransico HimMedical Director         Comments:  DonnaCerysnded orientation on 07/25/2019 to review rules and guidelines for program.  Completed 6 minute walk test. DonnaButch Pennyped at 4:23 due to chronic back, arthritis and sciatica pain. DonnaHebaharted and completed the walk test at 5:42. Intitial ITP, and exercise prescription.  VSS. Telemetry-Sinus Rhythm.  . Safety measures and social distancing in place per CDC guidelines.DonnaJenaently smokes 5 cigarettes a day and was given smoking cessation information today with the number for 1-800-quit now. Patient was tearful about family stressors with her sisters and son. Appointment set up for DonnaCletiseet with chaplain Bob HJeanella Crazeednesday September 9th at 10:30.QUALITY OF LIFE SCORE REVIEW  Pt completed Quality of Life survey as a participant in Cardiac Rehab. Scores 21.0 or below are considered low. Pt score very low the family area of her QOL questionnaire Overall 22.40, Health and Function 21.20, socioeconomic 23.75, physiological and spiritual 24.86, family 20.40. Patient quality of life slightly altered by physical constraints which limits ability to perform as prior to recent cardiac illness.  Offered emotional support and reassurance.  Will continue to monitor and intervene as necessary. MariaBarnet PallBSN 07/25/2019 3:54 PM

## 2019-07-25 NOTE — Telephone Encounter (Signed)
-----   Message from [name], MD sent at 07/25/2019  9:56 AM EDT ----- Regarding: RE: Okay to particpate in group exercise with high Covid Risk Score Patient appears ready for cardiac rehab. ----- Message ----- From: Rowe Pavy, RN Sent: 07/25/2019   8:13 AM EDT To: [name], MD Subject: Faythe Ghee to particpate in group exercise with hi#  Greetings Dr. Gwenlyn Found,  We are seeing patients on-site for cardiac rehab . A great deal of planning with advisement from our Medical Director - Dr. Radford Pax, CV Service line leadership, Infection Disease Control, Facilities, security, recommendations from American Association of Cardiac and Pulmonary Rehab (AACVPR) with the goal for optimal patient safety. Patients will have strict guidelines and criteria they must adhere to and follow. Patients will wear a mask during exercise and practice social distancing. Patients will have to complete screening prior to entry into gym area.   Your patient expressed great interest in participating in facility cardiac rehab. Patient has a COVID-19 risk score of 9. Do you feel this patient is appropriate to resume exercise in facility cardiac rehab? Any additional restrictions you feel are appropriate for this patient?  Pt completed her follow up on 8/25 with Kerin Ransom.  Thank you and we appreciate your input  Maurice Small RN, BSN Cardiac and Pulmonary Rehab Nurse Navigator   Cardiac Rehab Staff

## 2019-08-02 ENCOUNTER — Encounter (HOSPITAL_COMMUNITY)
Admission: RE | Admit: 2019-08-02 | Discharge: 2019-08-02 | Disposition: A | Discharge status: Home / Self Care | Payer: Medicaid Other | Source: Ambulatory Visit | Attending: Cardiovascular Disease | Admitting: Cardiovascular Disease

## 2019-08-02 ENCOUNTER — Other Ambulatory Visit: Payer: Self-pay

## 2019-08-02 DIAGNOSIS — Z955 Presence of coronary angioplasty implant and graft: Secondary | ICD-10-CM | POA: Diagnosis present

## 2019-08-02 NOTE — Progress Notes (Signed)
Daily Session Note  Patient Details  Name: Tracey Manning MRN: [id number] Date of Birth: 09/08/1961 Referring Provider:     CARDIAC REHAB PHASE II ORIENTATION from 07/25/2019 in [facility]  Referring Provider  Dr. Berry      Encounter Date: 08/02/2019  Check In: Session Check In - 08/02/19 0932      Check-In   Supervising physician immediately available to respond to emergencies  [facility] immediately available    Physician(s)  Dr. Hall    Location  [facility]    Staff Present  Portia Payne, RN, BSN;Brittany Vance, BS, ACSM CEP, Exercise Physiologist;Maria Whitaker, RN, BSN;Dalton Fletcher, MS, Exercise Physiologist;Olinty Richards, MS, ACSM CEP, Exercise Physiologist    Virtual Visit  No    Medication changes reported      No    Fall or balance concerns reported     No    Tobacco Cessation  No Change    Warm-up and Cool-down  Performed on first and last piece of equipment    Resistance Training Performed  No    VAD Patient?  No    PAD/SET Patient?  No      Pain Assessment   Currently in Pain?  Yes    Pain Score  1     Pain Location  Knee    Pain Orientation  Left    Pain Onset  Other (comment)   Patient has chronic pain and is treated at the pain clinic   Multiple Pain Sites  Yes      Pain   Pain Onset  --   chronic     2nd Pain Site   Pain Score  1    Pain Location  Other (Comment)   left thigh      Capillary Blood Glucose: No results found for this or any previous visit (from the past 24 hour(s)).    Social History   Tobacco Use  Smoking Status Former Smoker  . Packs/day: 0.50  . Years: 34.00  . Pack years: 17.00  . Types: Cigarettes  Smokeless Tobacco Never Used    Goals Met:  No report of cardiac concerns or symptoms  Goals Unmet:  Not Applicable  Comments: Pt started cardiac rehab today.  Pt tolerated light exercise without difficulty. VSS, telemetry-Sinus Rhythm with intermittent  PVC's, asymptomatic. A non sustained 4 beat run of WCT noted rate 193. Will send today's ECG tracing's to Dr Berry's office for review  Medication list reconciled. Pt denies barriers to medicaiton compliance.  PSYCHOSOCIAL ASSESSMENT:  PHQ-0. Pt exhibits positive coping skills, hopeful outlook with supportive family. No psychosocial needs identified at this time, no psychosocial interventions necessary.    Pt enjoys playing word puzzles, listening to music and relaxing.   Pt oriented to exercise equipment and routine.    Understanding verbalized. Tracey Manning has an appointment with the hospital chaplain after exercise today and she had voiced at orientation that she feels sad sometimes about about her family.Tracey Manning is fairly deconditioned and stopped early on the recumbent bike.  Maria Whitaker, RN,BSN 08/02/2019 11:17 AM    Dr. Traci Turner is Medical Director for Cardiac Rehab at [facility]. 

## 2019-08-04 ENCOUNTER — Encounter (HOSPITAL_COMMUNITY): Payer: Medicaid Other

## 2019-08-07 ENCOUNTER — Encounter (HOSPITAL_COMMUNITY): Payer: Medicaid Other

## 2019-08-09 ENCOUNTER — Encounter (HOSPITAL_COMMUNITY): Payer: Medicaid Other

## 2019-08-10 ENCOUNTER — Telehealth (HOSPITAL_COMMUNITY): Payer: Self-pay

## 2019-08-10 ENCOUNTER — Encounter (HOSPITAL_COMMUNITY): Payer: Self-pay | Admitting: *Deleted

## 2019-08-10 DIAGNOSIS — Z955 Presence of coronary angioplasty implant and graft: Secondary | ICD-10-CM

## 2019-08-10 NOTE — Telephone Encounter (Signed)
Pt called and stated that she will be out for 2 months because she is sick. Informed her that once she is ready to schedule she can call back and she would have to start from the beginning with orientation and that I would be closing her referral pt understood. Closed pt referral.

## 2019-08-10 NOTE — Progress Notes (Signed)
Patient attended orientation and one exercise session on 08/02/19. Patient called and said she would not be returning for 2 months. Tracey Manning has been discharged from cardiac rehab at this time.Barnet Pall, RN,BSN 08/10/2019 8:45 AM

## 2019-08-11 ENCOUNTER — Encounter (HOSPITAL_COMMUNITY): Payer: Medicaid Other

## 2019-08-14 ENCOUNTER — Encounter (HOSPITAL_COMMUNITY): Payer: Medicaid Other

## 2019-08-16 ENCOUNTER — Encounter (HOSPITAL_COMMUNITY): Payer: Medicaid Other

## 2019-08-18 ENCOUNTER — Encounter (HOSPITAL_COMMUNITY): Payer: Medicaid Other

## 2019-08-21 ENCOUNTER — Encounter (HOSPITAL_COMMUNITY): Payer: Medicaid Other

## 2019-08-23 ENCOUNTER — Encounter (HOSPITAL_COMMUNITY): Payer: Medicaid Other

## 2019-08-24 ENCOUNTER — Other Ambulatory Visit: Payer: Self-pay

## 2019-08-24 ENCOUNTER — Telehealth: Payer: Self-pay | Admitting: Cardiology

## 2019-08-24 ENCOUNTER — Emergency Department (HOSPITAL_COMMUNITY)
Admission: EM | Admit: 2019-08-24 | Discharge: 2019-08-24 | Discharge status: Against Medical Advice | Payer: Medicaid Other | Attending: Emergency Medicine | Admitting: Emergency Medicine

## 2019-08-24 ENCOUNTER — Encounter (HOSPITAL_COMMUNITY): Payer: Self-pay | Admitting: Emergency Medicine

## 2019-08-24 DIAGNOSIS — M25531 Pain in right wrist: Secondary | ICD-10-CM | POA: Diagnosis present

## 2019-08-24 DIAGNOSIS — Z5321 Procedure and treatment not carried out due to patient leaving prior to being seen by health care provider: Secondary | ICD-10-CM | POA: Insufficient documentation

## 2019-08-24 MED ORDER — OXYCODONE-ACETAMINOPHEN 5-325 MG PO TABS
1.0000 | ORAL_TABLET | ORAL | Status: DC | PRN
  Administered 2019-08-24: 1 via ORAL
  Filled 2019-08-24: qty 1

## 2019-08-24 NOTE — ED Triage Notes (Addendum)
Pt c/o R wrist pain x1 month after having cardiac cath done in July, states cath was done through her right wrist. States pain is much worse today.states when she saw cardiologist in aug they told her pain would improve but it hasnt. Denies any other injuries. No obvious swelling noted. Radial pulse intact. Skin color and temp appropriate. Pt very hypertensive in triage.

## 2019-08-24 NOTE — ED Notes (Signed)
No answer x3

## 2019-08-24 NOTE — ED Notes (Signed)
No answer for vitals recheck x2 

## 2019-08-24 NOTE — Telephone Encounter (Signed)
Pt called in reporting significant pain in the right wrist and arm. Had radial cath back in 05/2019 and has had intermittent pain since then but nothing this bad. Area is somewhat swollen, and warm to touch per her report. No redness. States pain is almost unbearable. I advised this could be nerve related, but seems odd to develop this distant from her cath. Could be infection or DVT? Advised if pain is that significant, would be evaluated at closest ER. She voiced understanding and thanked me for the call back.

## 2019-08-24 NOTE — ED Notes (Signed)
No answer for vitals recheck x1 and not visible in lobby

## 2019-08-25 ENCOUNTER — Telehealth: Payer: Self-pay | Admitting: Cardiovascular Disease

## 2019-08-25 ENCOUNTER — Encounter (HOSPITAL_COMMUNITY): Payer: Medicaid Other

## 2019-08-25 NOTE — Telephone Encounter (Signed)
Spoke to patient , she sttes she is having pain in right wrist to right arm - shooting , very painful.  Patient went to ER last night but did not stay .SHE STATE IT WAS TO CROWD. Patient would like something for pain.  RN informed patient that she would need to go back to ER or URGENT CARE . OFFICE WILL NOT BE ABLE TO PRESCRIBE ANYTHING FOR PAIN EXCEPT USING  TYLENOL OR IBUPROFEN.

## 2019-08-25 NOTE — Telephone Encounter (Signed)
Patient called and said she is  having significant pain in her R  wrist and arm. She gets sharp, shooting pains that bring her to tears. She is unable to hold a cup or a pen with that hand, and any writing she does just looks like scribbles. She can not tell if her hand is swollen or not, a it does not look different from her other arm. She having a lot of pain in her wrist. She has tried to use both ice and heat to relieve the pain, but nothing has seemed to help.  She states that she had a cath done earlier in July, and the cath was placed in her wrist, and the injection site hurts the most.    She tried to go to the ER yesterday, but the ER was crowded, and she decided to leave.  She would like to know what to do to alleviate the pain

## 2019-08-25 NOTE — Telephone Encounter (Signed)
Patient paged on call pager from ER waiting room where she presented to be evaluated for ongoing wrist pain at radial access site. She also described weakness in the ipsilateral hand. She requested an x-ray be performed. I explained to the patient that the ER or urgent care would be the only places open overnight for her to be evaluated. She told me that she did not want to wait to be seen and would call Dr. Kennon Holter office in the morning.   ED notes confirm that patient left without being seen.   Lauren K. Marletta Lor, MD

## 2019-08-28 ENCOUNTER — Encounter (HOSPITAL_COMMUNITY): Payer: Medicaid Other

## 2019-08-30 ENCOUNTER — Encounter (HOSPITAL_COMMUNITY): Payer: Medicaid Other

## 2019-09-01 ENCOUNTER — Encounter (HOSPITAL_COMMUNITY): Payer: Medicaid Other

## 2019-09-04 ENCOUNTER — Encounter (HOSPITAL_COMMUNITY): Payer: Medicaid Other

## 2019-09-06 ENCOUNTER — Encounter (HOSPITAL_COMMUNITY): Payer: Medicaid Other

## 2019-09-07 ENCOUNTER — Telehealth: Payer: Self-pay | Admitting: Medical

## 2019-09-07 NOTE — Telephone Encounter (Signed)
   Patient called the after hours line with complaints of swelling in her neck. She reports similar symptoms in July when she was diagnosed with heart failure. She reports compliance with her lasix. Her weights have been quite labile over the past few day: 212>201>206lbs. No complaints of SOB, LE edema, or chest pain. Recommended she take an additional lasix this evening and double up on her dose through Saturday (80mg  daily). Will send a message to our schedulers to attempt to arrange an in-office appointment either 10/16 or 10/19 for close follow-up. Patient was appreciative of the call and in agreement with the plan.  Abigail Butts, PA-C 09/07/19; 6:51 PM

## 2019-09-08 ENCOUNTER — Encounter (HOSPITAL_COMMUNITY): Payer: Medicaid Other

## 2019-09-08 NOTE — Telephone Encounter (Signed)
Message sent to scheduling to have patient set up.

## 2019-09-11 ENCOUNTER — Encounter (HOSPITAL_COMMUNITY): Payer: Medicaid Other

## 2019-09-11 NOTE — Telephone Encounter (Signed)
Noted. Thanks.

## 2019-09-11 NOTE — Telephone Encounter (Signed)
Thanks for the update, Almyra Free. That sounds like a reasonable plan. Can try to get her in with an APP if her symptoms worsen prior to her visit with Dr. Gwenlyn Found 09/26/2019. Thanks!

## 2019-09-11 NOTE — Telephone Encounter (Signed)
Tracey Manning, Patient is scheduled for ECHO and follow up after that with Dr.Berry- is this okay, or did you want them seen sooner?   Thanks!

## 2019-09-17 ENCOUNTER — Emergency Department (HOSPITAL_COMMUNITY)
Admission: EM | Admit: 2019-09-17 | Discharge: 2019-09-17 | Disposition: A | Discharge status: Home / Self Care | Payer: Medicaid Other | Attending: Emergency Medicine | Admitting: Emergency Medicine

## 2019-09-17 ENCOUNTER — Other Ambulatory Visit: Payer: Self-pay

## 2019-09-17 ENCOUNTER — Encounter (HOSPITAL_COMMUNITY): Payer: Self-pay | Admitting: Emergency Medicine

## 2019-09-17 DIAGNOSIS — I10 Essential (primary) hypertension: Secondary | ICD-10-CM | POA: Diagnosis not present

## 2019-09-17 DIAGNOSIS — K047 Periapical abscess without sinus: Secondary | ICD-10-CM | POA: Diagnosis not present

## 2019-09-17 DIAGNOSIS — Z955 Presence of coronary angioplasty implant and graft: Secondary | ICD-10-CM | POA: Insufficient documentation

## 2019-09-17 DIAGNOSIS — I2510 Atherosclerotic heart disease of native coronary artery without angina pectoris: Secondary | ICD-10-CM | POA: Diagnosis not present

## 2019-09-17 DIAGNOSIS — Z7982 Long term (current) use of aspirin: Secondary | ICD-10-CM | POA: Diagnosis not present

## 2019-09-17 DIAGNOSIS — F1210 Cannabis abuse, uncomplicated: Secondary | ICD-10-CM | POA: Insufficient documentation

## 2019-09-17 DIAGNOSIS — J449 Chronic obstructive pulmonary disease, unspecified: Secondary | ICD-10-CM | POA: Diagnosis not present

## 2019-09-17 DIAGNOSIS — Z87891 Personal history of nicotine dependence: Secondary | ICD-10-CM | POA: Diagnosis not present

## 2019-09-17 DIAGNOSIS — I252 Old myocardial infarction: Secondary | ICD-10-CM | POA: Insufficient documentation

## 2019-09-17 DIAGNOSIS — Z91040 Latex allergy status: Secondary | ICD-10-CM | POA: Diagnosis not present

## 2019-09-17 DIAGNOSIS — R519 Headache, unspecified: Secondary | ICD-10-CM | POA: Diagnosis present

## 2019-09-17 LAB — URINALYSIS, ROUTINE W REFLEX MICROSCOPIC
Bilirubin Urine: NEGATIVE
Glucose, UA: NEGATIVE mg/dL
Hgb urine dipstick: NEGATIVE
Ketones, ur: NEGATIVE mg/dL
Leukocytes,Ua: NEGATIVE
Nitrite: NEGATIVE
Protein, ur: NEGATIVE mg/dL
Specific Gravity, Urine: 1.005 (ref 1.005–1.030)
pH: 7 (ref 5.0–8.0)

## 2019-09-17 LAB — CBC
HCT: 41.7 % (ref 36.0–46.0)
Hemoglobin: 13.4 g/dL (ref 12.0–15.0)
MCH: 28 pg (ref 26.0–34.0)
MCHC: 32.1 g/dL (ref 30.0–36.0)
MCV: 87.1 fL (ref 80.0–100.0)
Platelets: 288 10*3/uL (ref 150–400)
RBC: 4.79 MIL/uL (ref 3.87–5.11)
RDW: 15.9 % — ABNORMAL HIGH (ref 11.5–15.5)
WBC: 7.1 10*3/uL (ref 4.0–10.5)
nRBC: 0 % (ref 0.0–0.2)

## 2019-09-17 LAB — COMPREHENSIVE METABOLIC PANEL
ALT: 20 U/L (ref 0–44)
AST: 20 U/L (ref 15–41)
Albumin: 4.5 g/dL (ref 3.5–5.0)
Alkaline Phosphatase: 98 U/L (ref 38–126)
Anion gap: 11 (ref 5–15)
BUN: 17 mg/dL (ref 6–20)
CO2: 31 mmol/L (ref 22–32)
Calcium: 9.6 mg/dL (ref 8.9–10.3)
Chloride: 98 mmol/L (ref 98–111)
Creatinine, Ser: 1.27 mg/dL — ABNORMAL HIGH (ref 0.44–1.00)
GFR calc Af Amer: 54 mL/min — ABNORMAL LOW (ref >60)
GFR calc non Af Amer: 46 mL/min — ABNORMAL LOW (ref >60)
Glucose, Bld: 101 mg/dL — ABNORMAL HIGH (ref 70–99)
Potassium: 3.2 mmol/L — ABNORMAL LOW (ref 3.5–5.1)
Sodium: 140 mmol/L (ref 135–145)
Total Bilirubin: 0.1 mg/dL — ABNORMAL LOW (ref 0.3–1.2)
Total Protein: 8.1 g/dL (ref 6.5–8.1)

## 2019-09-17 LAB — LIPASE, BLOOD: Lipase: 23 U/L (ref 11–51)

## 2019-09-17 MED ORDER — CLINDAMYCIN HCL 300 MG PO CAPS
300.0000 mg | ORAL_CAPSULE | Freq: Once | ORAL | Status: AC
  Administered 2019-09-17: 20:00:00 300 mg via ORAL
  Filled 2019-09-17: qty 1

## 2019-09-17 MED ORDER — ACETAMINOPHEN-CODEINE #3 300-30 MG PO TABS: 1.0000 | ORAL_TABLET | Freq: Four times a day (QID) | ORAL | 0 refills | Status: DC | PRN

## 2019-09-17 MED ORDER — SODIUM CHLORIDE 0.9% FLUSH: 3.0000 mL | Freq: Once | INTRAVENOUS | Status: DC

## 2019-09-17 MED ORDER — ACETAMINOPHEN-CODEINE #3 300-30 MG PO TABS
1.0000 | ORAL_TABLET | Freq: Once | ORAL | Status: AC
  Administered 2019-09-17: 1 via ORAL
  Filled 2019-09-17: qty 1

## 2019-09-17 MED ORDER — CLINDAMYCIN HCL 150 MG PO CAPS: 300.0000 mg | ORAL_CAPSULE | Freq: Three times a day (TID) | ORAL | 0 refills | Status: AC

## 2019-09-17 NOTE — ED Triage Notes (Signed)
Patient is complaining of toothache on the left side. Patient is also complaining of a migraine that started 3 days ago. Patient states she started having nausea and vomiting last night.

## 2019-09-17 NOTE — Discharge Instructions (Signed)
As discussed, with your headache and dental infection it is very portly follow-up with both your physician and a dentist. If you are unable to see your own dentist, please use our provided resources to follow-up with another one.  Return here for concerning changes in your condition.

## 2019-09-17 NOTE — ED Notes (Signed)
Pt verbalized discharge instructions and follow up care. Alert and ambulatory. No IV. Has ride home 

## 2019-09-17 NOTE — ED Provider Notes (Signed)
[facility] DEPT Provider Note   CSN: [id number] Arrival date & time: 09/17/19  1901     History   Chief Complaint Chief Complaint  Patient presents with  . Nausea  . Migraine  . Emesis    HPI Tracey Manning is a 58 y.o. female.     HPI Patient presents concern of headache, mouth pain.  Patient acknowledges multiple medical issues, including headaches.  When she has a known broken tooth in the left posterior superior region. This has been present for some time, but has become troublesome over the past week or so, with increasing swelling, discomfort in this area, and now pain radiating from the left upper jaw to the left temporal region and posteriorly.  The pain is so severe not proved with anything.  No vomiting, no diarrhea, no fever, no chills. Past Medical History:  Diagnosis Date  . Anxiety   . Arthritis   . Asthma   . Barrett's esophagus   . Chest pain   . Collagen vascular disease ([place])   . Colon polyps   . Coronary artery disease   . Depression   . Dyslipidemia   . GERD (gastroesophageal reflux disease)    occ  . Hyperlipidemia   . Hypertension   . Myocardial infarct ([place]) [id number]  . Sciatica   . Sleep apnea    no CPAP ordered but using oxygen at bedtime  . Tobacco abuse     Patient Active Problem List   Diagnosis Date Noted  . Chronic pain 07/18/2019  . Renal insufficiency 07/18/2019  . COPD (chronic obstructive pulmonary disease) ([place]) 06/28/2019  . Obesity (BMI 30-39.9) 06/28/2019  . Ischemic cardiomyopathy 06/28/2019  . Dyspnea on exertion 06/15/2019  . Acute systolic CHF (congestive heart failure) ([place])   . AKI (acute kidney injury) ([place])   . Abnormal uterine bleeding (AUB) 04/03/2016  . Dysfunctional uterine bleeding 11/14/2015  . Bacterial vaginosis (recurrent) 11/14/2015  . Chest pain 11/14/2015  . Essential hypertension 04/04/2014  . Dyslipidemia, goal LDL below 70 04/04/2014  . CAD S/P multiple PCIs  04/04/2014    Past Surgical History:  Procedure Laterality Date  . CARDIAC CATHETERIZATION  10/09/2003   [place]) - LAD with 30% prox narrowing, 50% stenosis in mid-portion of PLA; RCA with 40% narrowing proximally ([name], [name])  . CARDIAC CATHETERIZATION  10/10/2007   70% stenosis in first septal perforator branch of LAD, 60-70% narrowing in mid LAD, 20% narrowing in mid AV groove Cfx with 80% diffuse narrowing in small distal marginal, total occlusion of mid RCA with L to R collaterals, 90% stenosis diffusely in prox branch of RCA followed by 70% stenosis in secondary curve & 80% stenosis in small marginal branch ([name])  . CARDIAC CATHETERIZATION  05/28/2008   normal L main, RCA with 100% prox lesion w/distal filling from LAD collaterals, LAD with 20% prox tubular lesion/ 40% mid LAD lesion/previous stent patent ([name])  . CARDIAC CATHETERIZATION  09/15/2009   discrete 100% osital RCA lesion, 50% prox LAD lesion, non-obstructive disease in all coronaries ([name])  . CESAREAN SECTION    . COLONOSCOPY    . COLONOSCOPY W/ POLYPECTOMY    . CORONARY ANGIOPLASTY WITH STENT PLACEMENT  10/31/2007   PCI of distal Cfx with 2.25x29m Taxus Adam DES, 60% narrowing of mid LAD (Dr. TCorky Downs  . CORONARY ANGIOPLASTY WITH STENT PLACEMENT  06/11/2011   PCI of prox-mid LAD with 3x140mDES  Resolute ([name])  . CORONARY STENT INTERVENTION N/A 06/19/2019   Procedure: CORONARY STENT INTERVENTION;  Surgeon: [name], MD;  Location: [facility];  Service: Cardiovascular;  Laterality: N/A;  LAD  . DILATION AND CURETTAGE, DIAGNOSTIC / THERAPEUTIC    . DILITATION & CURRETTAGE/HYSTROSCOPY WITH HYDROTHERMAL ABLATION N/A 04/03/2016   Procedure: DILATATION & CURETTAGE/HYSTEROSCOPY WITH HYDROTHERMAL ABLATION;  Surgeon: [name], MD;  Location: [facility];  Service: Gynecology;  Laterality: N/A;  . NM MYOCAR PERF WALL MOTION  08/2009    persantine myoview - normal perfusion in all regions, perfusion defect in anterior region (breast attenuation), EF 52%, low risk scan  . RIGHT/LEFT HEART CATH AND CORONARY ANGIOGRAPHY N/A 06/15/2019   Procedure: RIGHT/LEFT HEART CATH AND CORONARY ANGIOGRAPHY;  Surgeon: Belva Crome, MD;  Location: [facility];  Service: Cardiovascular;  Laterality: N/A;     OB History    Gravida  8   Para      Term      Preterm      AB  6   Living  2     SAB  6   TAB      Ectopic      Multiple      Live Births  2            Home Medications    Prior to Admission medications   Medication Sig Start Date End Date Taking? Authorizing Provider  acetaminophen-codeine (TYLENOL #3) 300-30 MG tablet Take 1 tablet by mouth every 6 (six) hours as needed for severe pain. 09/17/19   Carmin Muskrat, MD  albuterol (PROVENTIL HFA;VENTOLIN HFA) 108 (90 BASE) MCG/ACT inhaler Inhale 2 puffs into the lungs every 6 (six) hours as needed for wheezing or shortness of breath.    [provider]  albuterol (PROVENTIL) (2.5 MG/3ML) 0.083% nebulizer solution Take 6 mLs by nebulization every 6 (six) hours as needed for wheezing or shortness of breath. 03/27/19   [provider]  aspirin EC 81 MG EC tablet Take 1 tablet (81 mg total) by mouth daily. 06/21/19   Kayleen Memos, DO  atorvastatin (LIPITOR) 80 MG tablet Take 1 tablet (80 mg total) by mouth daily. 06/21/19   Kayleen Memos, DO  buPROPion (WELLBUTRIN XL) 150 MG 24 hr tablet Take 1 tablet (150 mg total) by mouth daily. FOR SMOKING 07/18/19   Erlene Quan, PA-C  cetirizine (ZYRTEC) 10 MG tablet Take 10 mg by mouth daily as needed for allergies. 05/04/19   [provider]  clindamycin (CLEOCIN) 150 MG capsule Take 2 capsules (300 mg total) by mouth 3 (three) times daily for 7 days. 09/17/19 09/24/19  Carmin Muskrat, MD  clopidogrel (PLAVIX) 75 MG tablet Take 1 tablet (75 mg total) by mouth daily with breakfast. 06/20/19    Kayleen Memos, DO  dicyclomine (BENTYL) 20 MG tablet Take 1 tablet (20 mg total) by mouth 3 (three) times daily before meals. Patient not taking: Reported on 07/24/2019 05/04/18   Carlisle Cater, PA-C  fluticasone-salmeterol (ADVAIR HFA) [phone] MCG/ACT inhaler Inhale 2 puffs into the lungs 2 (two) times daily. 01/04/14   [name], MD  furosemide (LASIX) 40 MG tablet Take 1 tablet (40 mg total) by mouth daily. 06/21/19   Kayleen Memos, DO  hydrALAZINE (APRESOLINE) 50 MG tablet Take 1 tablet (50 mg total) by mouth every 8 (eight) hours. 06/20/19   Kayleen Memos, DO  isosorbide mononitrate (IMDUR) 30 MG  24 hr tablet Take 1 tablet (30 mg total) by mouth daily. 06/21/19   Kayleen Memos, DO  lidocaine (XYLOCAINE) 2 % solution Use as directed 3 mLs in the mouth or throat 3 (three) times daily before meals. Swish and swallow 3 mLs before meals as needed 07/04/19   [provider]  losartan (COZAAR) 50 MG tablet Take 1 tablet (50 mg total) by mouth daily. 06/21/19   Kayleen Memos, DO  NARCAN 4 MG/0.1ML LIQD nasal spray kit Place 1 spray into the nose once.  09/01/18   [provider]  nebivolol (BYSTOLIC) 10 MG tablet Take 20 mg by mouth daily.    [provider]  neomycin-polymyxin b-dexamethasone (MAXITROL) 3.5-10000-0.1 SUSP Place 1 drop into the left eye 2 (two) times daily. 09/09/18   [provider]  nitroGLYCERIN (NITROSTAT) 0.4 MG SL tablet Place 1 tablet (0.4 mg total) under the tongue every 5 (five) minutes as needed for chest pain. Patient not taking: Reported on 07/24/2019 01/14/18   [name], MD  oxyCODONE (OXY IR/ROXICODONE) 5 MG immediate release tablet Take 1 tablet (5 mg total) by mouth daily as needed for severe pain or breakthrough pain. Patient not taking: Reported on 07/24/2019 06/20/19   Kayleen Memos, DO  pantoprazole (PROTONIX) 20 MG tablet Take 2 tablets (40 mg total) by mouth 2 (two) times daily. Patient not taking: Reported on 07/24/2019  04/27/18   Yetta Flock, MD  spironolactone (ALDACTONE) 25 MG tablet TAKE 1 2 (ONE HALF) TABLET BY MOUTH ONCE DAILY 06/30/19   [provider]  tiZANidine (ZANAFLEX) 4 MG tablet Take 4 mg by mouth 2 (two) times daily as needed for muscle spasms.    [provider]  Vitamin D, Ergocalciferol, (DRISDOL) 1.25 MG (50000 UT) CAPS capsule Take 50,000 Units by mouth once a week. 09/23/18   [provider]  zolpidem (AMBIEN) 10 MG tablet Take 10 mg by mouth at bedtime as needed for sleep.    [provider]    Family History Family History  Problem Relation Age of Onset  . Leukemia Mother   . Clotting disorder Father        blood clot  . Hypertension Sister   . Diabetes Sister   . Stroke Sister 71  . Bleeding Disorder Son        ITP "free bleeding disorder"  . Colon cancer Paternal Grandmother   . Diabetes Paternal Grandmother   . Stomach cancer Neg Hx   . Pancreatic cancer Neg Hx     Social History Social History   Tobacco Use  . Smoking status: Former Smoker    Packs/day: 0.50    Years: 34.00    Pack years: 17.00    Types: Cigarettes  . Smokeless tobacco: Never Used  Substance Use Topics  . Alcohol use: No    Alcohol/week: 0.0 standard drinks    Comment: seldom  . Drug use: Yes    Types: Marijuana    Comment: 06/19/19     Allergies   Coreg [carvedilol], Other, Amoxicillin, Dilaudid [hydromorphone hcl], Ibuprofen, Latex, and Metoprolol   Review of Systems Review of Systems  Constitutional:       Per HPI, otherwise negative  HENT:       Per HPI, otherwise negative  Respiratory:       Per HPI, otherwise negative  Cardiovascular:       Per HPI, otherwise negative  Gastrointestinal: Negative for vomiting.  Endocrine:  Negative aside from HPI  Genitourinary:       Neg aside from HPI   Musculoskeletal:       Per HPI, otherwise negative  Skin: Negative.   Neurological: Negative for syncope.     Physical Exam Updated  Vital Signs BP (!) 156/108 (BP Location: Right Arm)   Pulse 93   Temp 98.5 F (36.9 C) (Oral)   Ht 5' 1"  (1.549 m)   Wt 93 kg   LMP  (LMP Unknown) Comment: verified BEFORE imaging  SpO2 96%   BMI 38.73 kg/m   Physical Exam Vitals signs and nursing note reviewed.  Constitutional:      General: She is not in acute distress.    Appearance: She is well-developed.  HENT:     Head: Normocephalic and atraumatic.      Mouth/Throat:   Eyes:     Conjunctiva/sclera: Conjunctivae normal.  Cardiovascular:     Rate and Rhythm: Normal rate and regular rhythm.  Pulmonary:     Effort: Pulmonary effort is normal. No respiratory distress.     Breath sounds: Normal breath sounds. No stridor.  Abdominal:     General: There is no distension.  Skin:    General: Skin is warm and dry.  Neurological:     Mental Status: She is alert and oriented to person, place, and time.     Cranial Nerves: No cranial nerve deficit.      ED Treatments / Results  Labs (all labs ordered are listed, but only abnormal results are displayed) Labs Reviewed  LIPASE, BLOOD  COMPREHENSIVE METABOLIC PANEL  CBC  URINALYSIS, ROUTINE W REFLEX MICROSCOPIC    Procedures Procedures (including critical care time)  Medications Ordered in ED Medications  sodium chloride flush (NS) 0.9 % injection 3 mL (has no administration in time range)  acetaminophen-codeine (TYLENOL #3) 300-30 MG per tablet 1 tablet (has no administration in time range)  clindamycin (CLEOCIN) capsule 300 mg (has no administration in time range)     Initial Impression / Assessment and Plan / ED Course  I have reviewed the triage vital signs and the nursing notes.  Pertinent labs & imaging results that were available during my care of the patient were reviewed by me and considered in my medical decision making (see chart for details).  This adult female presents with new dental fracture, concern for infection given the substantial swelling  and headache. Patient does have multiple medical problems including chronic pain, but given concern for infection, pain, she received a short course of analgesia, antibiotics, will follow-up with dentist and primary care.  No evidence of bacteremia, sepsis, airway compromise  Final Clinical Impressions(s) / ED Diagnoses   Final diagnoses:  Bad headache  Dental infection    ED Discharge Orders         Ordered    acetaminophen-codeine (TYLENOL #3) 300-30 MG tablet  Every 6 hours PRN     09/17/19 1958    clindamycin (CLEOCIN) 150 MG capsule  3 times daily     09/17/19 1958           Carmin Muskrat, MD 09/17/19 2003

## 2019-09-18 ENCOUNTER — Ambulatory Visit (HOSPITAL_COMMUNITY): Payer: Medicaid Other | Attending: Cardiology

## 2019-09-18 ENCOUNTER — Other Ambulatory Visit: Payer: Medicaid Other

## 2019-09-18 DIAGNOSIS — E785 Hyperlipidemia, unspecified: Secondary | ICD-10-CM | POA: Diagnosis present

## 2019-09-18 DIAGNOSIS — I255 Ischemic cardiomyopathy: Secondary | ICD-10-CM

## 2019-09-22 ENCOUNTER — Ambulatory Visit: Payer: Medicaid Other | Admitting: Cardiovascular Disease

## 2019-09-26 ENCOUNTER — Encounter: Payer: Self-pay | Admitting: Cardiovascular Disease

## 2019-09-26 ENCOUNTER — Other Ambulatory Visit: Payer: Self-pay

## 2019-09-26 ENCOUNTER — Ambulatory Visit (INDEPENDENT_AMBULATORY_CARE_PROVIDER_SITE_OTHER): Payer: Medicaid Other | Admitting: Cardiovascular Disease

## 2019-09-26 VITALS — BP 125/79 | HR 72 | Temp 97.0°F | Ht 61.0 in | Wt 207.4 lb

## 2019-09-26 DIAGNOSIS — I255 Ischemic cardiomyopathy: Secondary | ICD-10-CM | POA: Diagnosis not present

## 2019-09-26 DIAGNOSIS — I5021 Acute systolic (congestive) heart failure: Secondary | ICD-10-CM | POA: Diagnosis not present

## 2019-09-26 DIAGNOSIS — E785 Hyperlipidemia, unspecified: Secondary | ICD-10-CM

## 2019-09-26 DIAGNOSIS — I2510 Atherosclerotic heart disease of native coronary artery without angina pectoris: Secondary | ICD-10-CM | POA: Diagnosis not present

## 2019-09-26 DIAGNOSIS — Z9861 Coronary angioplasty status: Secondary | ICD-10-CM

## 2019-09-26 NOTE — Patient Instructions (Signed)
Medication Instructions:  NO CHANGE *If you need a refill on your cardiac medications before your next appointment, please call your pharmacy*  Lab Work: Your physician recommends that you return for lab work PRIOR TO EATING If you have labs (blood work) drawn today and your tests are completely normal, you will receive your results only by: Marland Kitchen MyChart Message (if you have MyChart) OR . A paper copy in the mail If you have any lab test that is abnormal or we need to change your treatment, we will call you to review the results.  Follow-Up: At [facility], you and your health needs are our priority.  As part of our continuing mission to provide you with exceptional heart care, we have created designated Provider Care Teams.  These Care Teams include your primary Cardiologist (physician) and Advanced Practice Providers (APPs -  Physician Assistants and Nurse Practitioners) who all work together to provide you with the care you need, when you need it.  Your physician recommends that you schedule a follow-up appointment in: [place]  Your physician wants you to follow-up in: [place] will receive a reminder letter in the mail two months in advance. If you don't receive a letter, please call our office to schedule the follow-up appointment.    REFERRAL TO [place]

## 2019-09-26 NOTE — Assessment & Plan Note (Signed)
History of dyslipidemia on high-dose statin therapy.  Her last LDL in July was 131.  We will recheck a fasting lipid liver profile.

## 2019-09-26 NOTE — Assessment & Plan Note (Signed)
History of essential hypertension with blood pressure measured today 125/79.  She is on hydralazine, losartan and Bystolic.

## 2019-09-26 NOTE — Assessment & Plan Note (Signed)
History of CAD status post multiple stent procedures in the past dating back to 2008 when Dr. Einar Gip put a Taxus drug-eluting stent in her circumflex, 06/11/2011 and Dr. Claiborne Billings put a stent in her LAD.  She was just recath in July by Dr. Tamala Julian revealing a known occluded RCA, obtuse marginal branch disease and high-grade ostial/proximal LAD disease which I stented on 06/13/2019.  She remains on dual antiplatelet therapy and denies chest pain or shortness of breath.

## 2019-09-26 NOTE — Progress Notes (Signed)
09/26/2019 [name]   1961/09/06  [id number]  Primary Physician Nolene Ebbs, MD Primary Cardiologist: [name] MD [name], [place]  HPI:  Tracey Manning is a 58 y.o.  moderately overweight single [place] American female mother of 2 children, grandmother and 2 grandchildren who currently is out of work on [employer] and disability. I last saw her in the office  01/25/2019.She was referred by Dr. Shawna Orleans for cardiovascular evaluation because of chest pain. Her cardiac risk factor profile is positive for 20-pack-years of tobacco abuse currently smoking one pack per day, treated type of pressure and high cholesterol. She has had multiple heart attacks in the past dating back to 2004. She had a stent placed in her circumflex by Dr. Einar Gip December 2008 (Taxus 2.5 x 24 mm). She had a Medtronic stent placed in her proximal LAD by Dr. Claiborne Billings 06/11/11. Over the last 6 months she has developed recurrent chest pain as well as dyspnea on exertion. Since I saw her on 04/04/14 I obtained a Myoview stress test which was normal and a 2-D echo which revealed normal LV function. Since I saw hera year ago,she's done well clinically. Shegets occasional chest pain which has not changed in frequency or severity.Her blood pressure is somewhat elevated due to back and abdominal pain however.  She was admitted in July with chest pain and shortness of breath.  Cardiac cath performed Dr. Tamala Julian revealed an occluded RCA with left-to-right collaterals, moderate AV groove circumflex disease, patent proximal LAD stent with high-grade disease just proximal to this.  After she was evaluated by cardiothoracic surgery and turned down for bypass grafting I performed ostial/proximal LAD intervention 06/19/2019 with a 3.5 mm x 12 mm long Synergy drug-eluting stent.    Current Meds  Medication Sig  . acetaminophen-codeine (TYLENOL #3) 300-30 MG tablet Take 1 tablet by mouth every 6 (six) hours as  needed for severe pain.  Marland Kitchen albuterol (PROVENTIL HFA;VENTOLIN HFA) 108 (90 BASE) MCG/ACT inhaler Inhale 2 puffs into the lungs every 6 (six) hours as needed for wheezing or shortness of breath.  Marland Kitchen albuterol (PROVENTIL) (2.5 MG/3ML) 0.083% nebulizer solution Take 6 mLs by nebulization every 6 (six) hours as needed for wheezing or shortness of breath.  Marland Kitchen aspirin EC 81 MG EC tablet Take 1 tablet (81 mg total) by mouth daily.  Marland Kitchen atorvastatin (LIPITOR) 80 MG tablet Take 1 tablet (80 mg total) by mouth daily.  Marland Kitchen buPROPion (WELLBUTRIN XL) 150 MG 24 hr tablet Take 1 tablet (150 mg total) by mouth daily. FOR SMOKING  . cetirizine (ZYRTEC) 10 MG tablet Take 10 mg by mouth daily as needed for allergies.  Marland Kitchen clopidogrel (PLAVIX) 75 MG tablet Take 1 tablet (75 mg total) by mouth daily with breakfast.  . dicyclomine (BENTYL) 20 MG tablet Take 1 tablet (20 mg total) by mouth 3 (three) times daily before meals.  . fluticasone-salmeterol (ADVAIR HFA) 115-21 MCG/ACT inhaler Inhale 2 puffs into the lungs 2 (two) times daily.  . furosemide (LASIX) 40 MG tablet Take 1 tablet (40 mg total) by mouth daily.  . hydrALAZINE (APRESOLINE) 50 MG tablet Take 1 tablet (50 mg total) by mouth every 8 (eight) hours.  . isosorbide mononitrate (IMDUR) 30 MG 24 hr tablet Take 1 tablet (30 mg total) by mouth daily.  Marland Kitchen lidocaine (XYLOCAINE) 2 % solution Use as directed 3 mLs in the mouth or throat 3 (three) times daily before meals. Swish and swallow 3 mLs before meals as needed  .  losartan (COZAAR) 50 MG tablet Take 1 tablet (50 mg total) by mouth daily.  Marland Kitchen NARCAN 4 MG/0.1ML LIQD nasal spray kit Place 1 spray into the nose once.   . nebivolol (BYSTOLIC) 10 MG tablet Take 20 mg by mouth daily.  Marland Kitchen neomycin-polymyxin b-dexamethasone (MAXITROL) 3.5-10000-0.1 SUSP Place 1 drop into the left eye 2 (two) times daily.  . nitroGLYCERIN (NITROSTAT) 0.4 MG SL tablet Place 1 tablet (0.4 mg total) under the tongue every 5 (five) minutes as needed for  chest pain.  Marland Kitchen oxyCODONE (OXY IR/ROXICODONE) 5 MG immediate release tablet Take 1 tablet (5 mg total) by mouth daily as needed for severe pain or breakthrough pain.  . pantoprazole (PROTONIX) 20 MG tablet Take 2 tablets (40 mg total) by mouth 2 (two) times daily.  Marland Kitchen spironolactone (ALDACTONE) 25 MG tablet TAKE 1 2 (ONE HALF) TABLET BY MOUTH ONCE DAILY  . tiZANidine (ZANAFLEX) 4 MG tablet Take 4 mg by mouth 2 (two) times daily as needed for muscle spasms.  . Vitamin D, Ergocalciferol, (DRISDOL) 1.25 MG (50000 UT) CAPS capsule Take 50,000 Units by mouth once a week.  . zolpidem (AMBIEN) 10 MG tablet Take 10 mg by mouth at bedtime as needed for sleep.     Allergies  Allergen Reactions  . Coreg [Carvedilol] Other (See Comments)    Headache  . Other Anaphylaxis and Hives    Fresh strawberries (throat swelled)  . Amoxicillin Nausea And Vomiting    Did it involve swelling of the face/tongue/throat, SOB, or low BP? Yes Did it involve sudden or severe rash/hives, skin peeling, or any reaction on the inside of your mouth or nose? Yes Did you need to seek medical attention at a hospital or doctor's office? Yes When did it last happen?within last 10 years If all above answers are "NO", may proceed with cephalosporin use.   . Dilaudid [Hydromorphone Hcl] Nausea And Vomiting  . Ibuprofen Other (See Comments)    wheezing  . Latex Swelling    No reaction with bandaids  . Metoprolol     HEADACHE and Wheezing    Social History   Socioeconomic History  . Marital status: Single    Spouse name: Not on file  . Number of children: 2  . Years of education: 65  . Highest education level: High school graduate  Occupational History  . Not on file  Social Needs  . Financial resource strain: Not hard at all  . Food insecurity    Worry: Never true    Inability: Never true  . Transportation needs    Medical: No    Non-medical: No  Tobacco Use  . Smoking status: Former Smoker    Packs/day:  0.50    Years: 34.00    Pack years: 17.00    Types: Cigarettes  . Smokeless tobacco: Never Used  Substance and Sexual Activity  . Alcohol use: No    Alcohol/week: 0.0 standard drinks    Comment: seldom  . Drug use: Yes    Types: Marijuana    Comment: 06/19/19  . Sexual activity: Yes    Partners: Male    Birth control/protection: Post-menopausal  Lifestyle  . Physical activity    Days per week: 0 days    Minutes per session: 0 min  . Stress: To some extent  Relationships  . Social Herbalist on phone: Not on file    Gets together: Not on file    Attends religious service: Not on file  Active member of club or organization: Not on file    Attends meetings of clubs or organizations: Not on file    Relationship status: Not on file  . Intimate partner violence    Fear of current or ex partner: Not on file    Emotionally abused: Not on file    Physically abused: Not on file    Forced sexual activity: Not on file  Other Topics Concern  . Not on file  Social History Narrative  . Not on file     Review of Systems: General: negative for chills, fever, night sweats or weight changes.  Cardiovascular: negative for chest pain, dyspnea on exertion, edema, orthopnea, palpitations, paroxysmal nocturnal dyspnea or shortness of breath Dermatological: negative for rash Respiratory: negative for cough or wheezing Urologic: negative for hematuria Abdominal: negative for nausea, vomiting, diarrhea, bright red blood per rectum, melena, or hematemesis Neurologic: negative for visual changes, syncope, or dizziness All other systems reviewed and are otherwise negative except as noted above.    Blood pressure 125/79, pulse 72, temperature (!) 97 F (36.1 C), height 5' 1"  (1.549 m), weight 207 lb 6.4 oz (94.1 kg), SpO2 98 %.  General appearance: alert and no distress Neck: no adenopathy, no carotid bruit, no JVD, supple, symmetrical, trachea midline and thyroid not enlarged,  symmetric, no tenderness/mass/nodules Lungs: clear to auscultation bilaterally Heart: regular rate and rhythm, S1, S2 normal, no murmur, click, rub or gallop Extremities: extremities normal, atraumatic, no cyanosis or edema Pulses: 2+ and symmetric Skin: Skin color, texture, turgor normal. No rashes or lesions Neurologic: Alert and oriented X 3, normal strength and tone. Normal symmetric reflexes. Normal coordination and gait  EKG not performed today  ASSESSMENT AND PLAN:   Essential hypertension History of essential hypertension with blood pressure measured today 125/79.  She is on hydralazine, losartan and Bystolic.  Dyslipidemia, goal LDL below 70 History of dyslipidemia on high-dose statin therapy.  Her last LDL in July was 131.  We will recheck a fasting lipid liver profile.  CAD S/P multiple PCIs History of CAD status post multiple stent procedures in the past dating back to 2008 when Dr. Einar Gip put a Taxus drug-eluting stent in her circumflex, 06/11/2011 and Dr. Claiborne Billings put a stent in her LAD.  She was just recath in July by Dr. Tamala Julian revealing a known occluded RCA, obtuse marginal branch disease and high-grade ostial/proximal LAD disease which I stented on 06/13/2019.  She remains on dual antiplatelet therapy and denies chest pain or shortness of breath.  Ischemic cardiomyopathy History of ischemic cardiomyopathy with an EF of 25% range by 2D echo July 2020.  She is on Bystolic, hydralazine, losartan.  I am going to refer her to the advanced heart failure clinic for optimization of her pharmacologic therapy and consideration for ICD implantation if she does not improve her EF      [name] MD [facility] 09/26/2019 12:08 PM

## 2019-09-26 NOTE — Assessment & Plan Note (Signed)
History of ischemic cardiomyopathy with an EF of 25% range by 2D echo July 2020.  She is on Bystolic, hydralazine, losartan.  I am going to refer her to the advanced heart failure clinic for optimization of her pharmacologic therapy and consideration for ICD implantation if she does not improve her EF

## 2019-12-07 IMAGING — MG DIGITAL SCREENING BILATERAL MAMMOGRAM WITH TOMO AND CAD
8 series · 8 of 24 positions shown · non-contrast
Comparison: Previous exam(s).

ACR Breast Density Category a: The breast tissue is almost entirely
fatty.

CLINICAL DATA: Screening.

EXAM:
DIGITAL SCREENING BILATERAL MAMMOGRAM WITH TOMO AND CAD

[L MLO synth-2D]
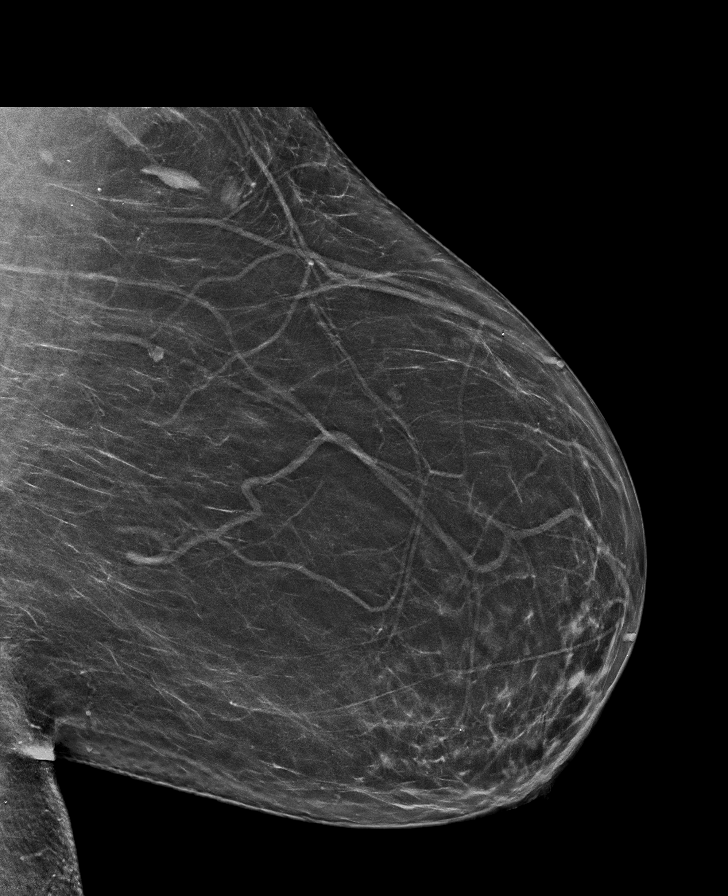

[R MLO synth-2D]
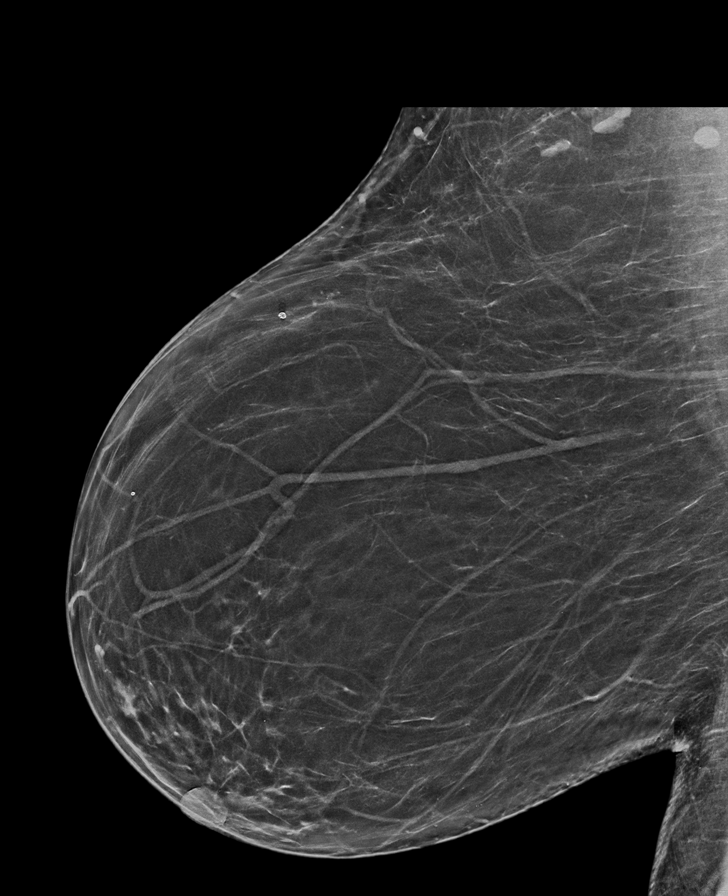

[R CC synth-2D]
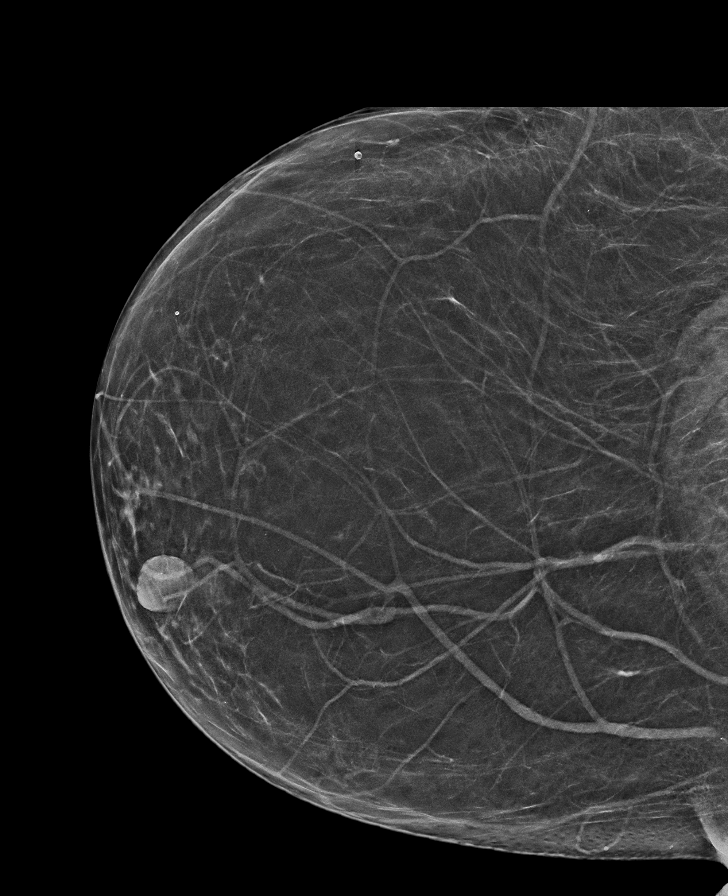

[L CC synth-2D]
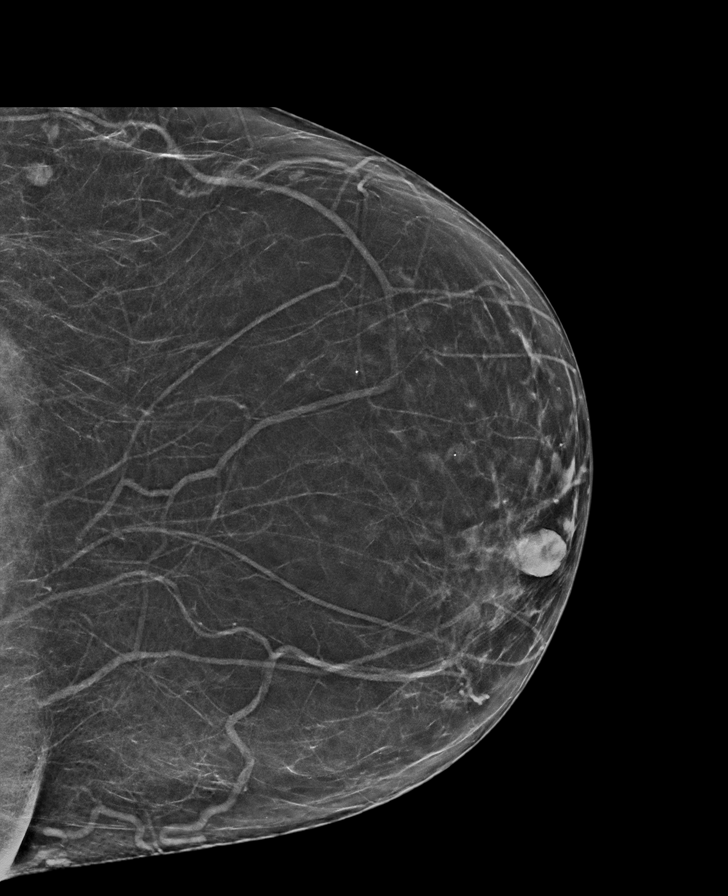

[R MLO tomo · tomo slice 37/72.0]
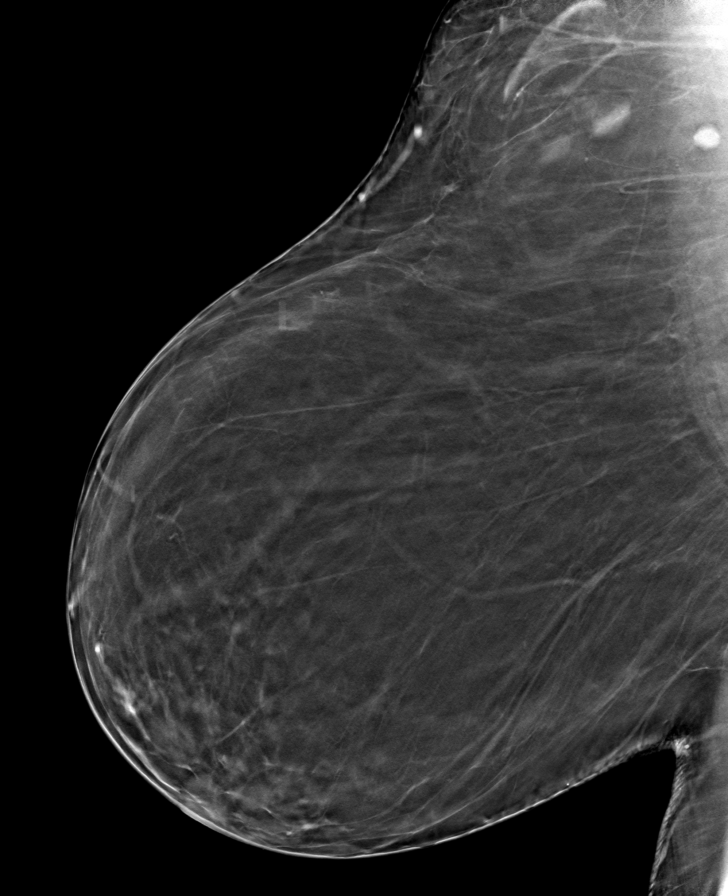

[R CC tomo · tomo slice 31/62.0]
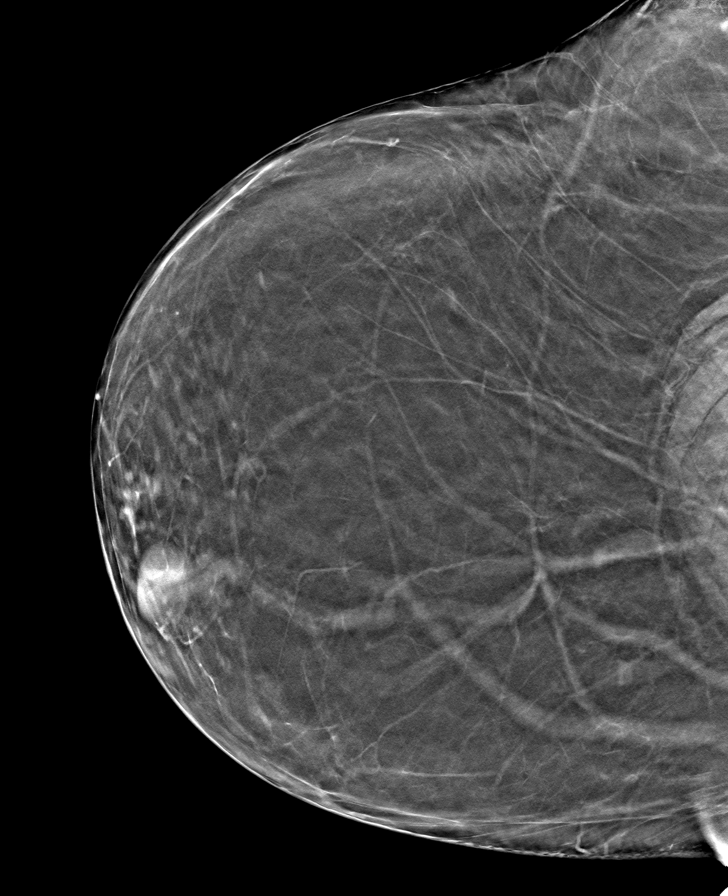

[L MLO tomo · tomo slice 37/73.0]
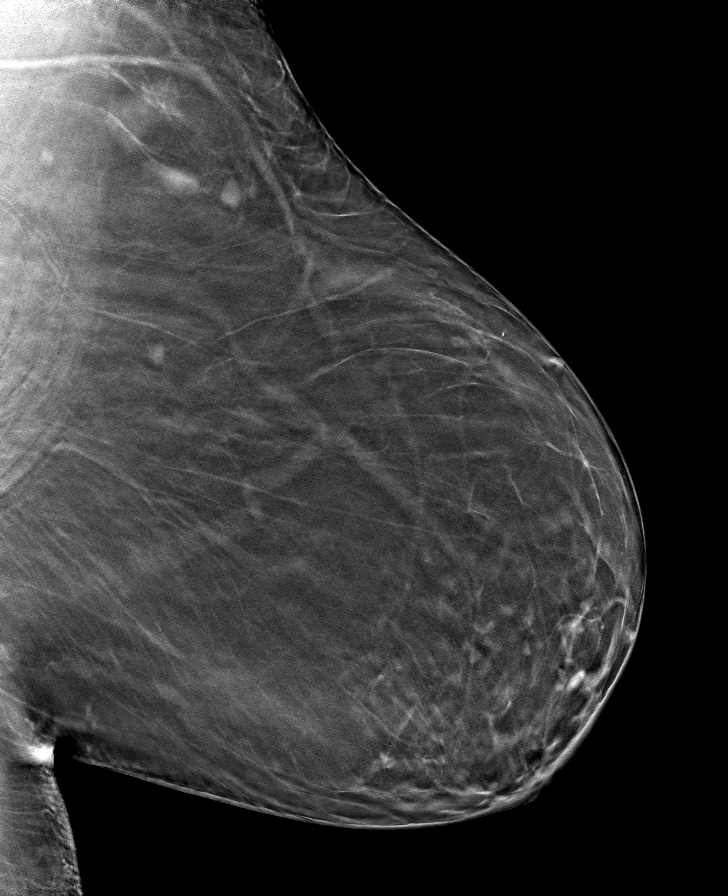

[L CC tomo · tomo slice 33/65.0]
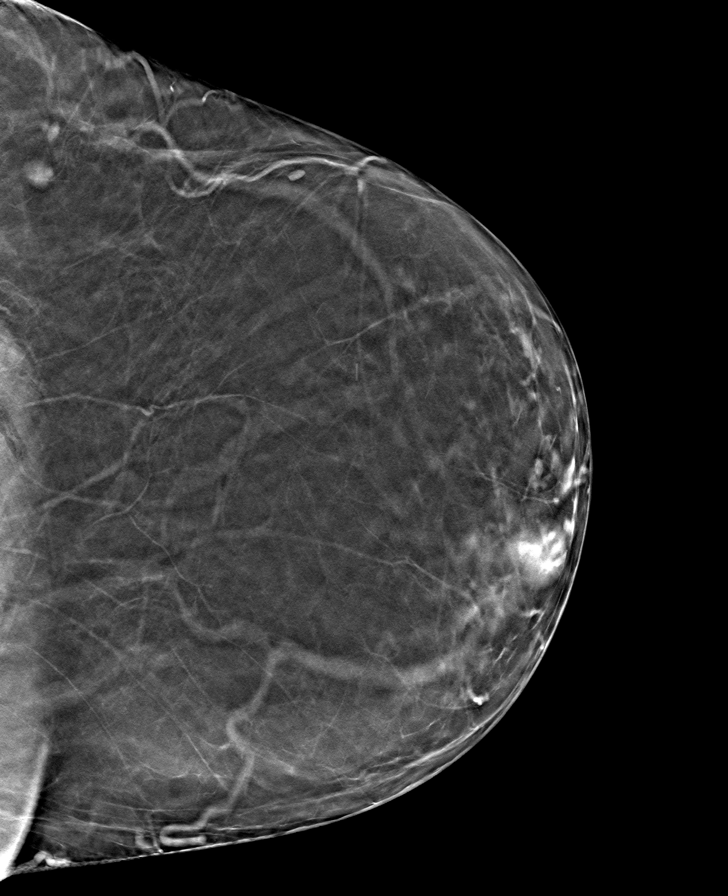

[8 of 24 positions shown; findings below may reference images not displayed]

FINDINGS: There are no findings suspicious for malignancy. Images were
processed with CAD.
IMPRESSION: No mammographic evidence of malignancy. A result letter of this
screening mammogram will be mailed directly to the patient.

RECOMMENDATION:
Screening mammogram in one year. (Code:[id number])

BI-RADS CATEGORY  1: Negative.

## 2020-01-04 ENCOUNTER — Ambulatory Visit (INDEPENDENT_AMBULATORY_CARE_PROVIDER_SITE_OTHER): Payer: Medicaid Other | Admitting: Cardiology

## 2020-01-04 ENCOUNTER — Other Ambulatory Visit: Payer: Self-pay

## 2020-01-04 ENCOUNTER — Encounter: Payer: Self-pay | Admitting: Cardiology

## 2020-01-04 ENCOUNTER — Ambulatory Visit: Payer: Medicaid Other | Admitting: Cardiology

## 2020-01-04 VITALS — BP 155/109 | HR 95 | Ht 61.0 in | Wt 206.0 lb

## 2020-01-04 DIAGNOSIS — E785 Hyperlipidemia, unspecified: Secondary | ICD-10-CM | POA: Diagnosis not present

## 2020-01-04 DIAGNOSIS — Z9861 Coronary angioplasty status: Secondary | ICD-10-CM

## 2020-01-04 DIAGNOSIS — G894 Chronic pain syndrome: Secondary | ICD-10-CM

## 2020-01-04 DIAGNOSIS — I255 Ischemic cardiomyopathy: Secondary | ICD-10-CM

## 2020-01-04 DIAGNOSIS — E669 Obesity, unspecified: Secondary | ICD-10-CM | POA: Diagnosis not present

## 2020-01-04 DIAGNOSIS — I2510 Atherosclerotic heart disease of native coronary artery without angina pectoris: Secondary | ICD-10-CM

## 2020-01-04 DIAGNOSIS — N183 Chronic kidney disease, stage 3 unspecified: Secondary | ICD-10-CM

## 2020-01-04 NOTE — Progress Notes (Signed)
Cardiology Office Note:    Date:  01/04/2020   ID:  Tracey Manning, DOB 02/14/1961, MRN [id number]  PCP:  Avbuere, Edwin, MD  Cardiologist:  Jonathan Berry, MD  Electrophysiologist:  None   Referring MD: Avbuere, Edwin, MD   C/O shoulder pain  History of Present Illness:    Tracey Manning is a pleasant, overweight 58 y.o.AA female with a hx of CAD. She is here for a previously scheduled 3 month follow up. She had a remote PCI in 2004 in [place]. In 2008 she had a circumflex PCI by Dr. Kelly. She had known occluded RCA with left-to-right collaterals. In 2012 she had an LAD stent placed again by Dr. Kelly.   She was admitted 06/11/2019 with acute respiratory failure and unstable angina. She had new LV dysfunction with an EF of 25%. She had acute kidney injury as well with a creatinine of 1.8. the patient was admitted, diuresed, and underwent diagnostic catheterization 06/15/2019. This revealed a 60 to 70% mid circumflex stenosis, known occluded RCA with left-to-right collaterals, and a 95% proximal LAD before the previously placed stent which was 50 to 60% narrowed. After review the patient was turned down for CABG and she underwent intervention to her LAD on 06/19/2019 by Dr. Berry.   Since that admission she had a f/u echo which revealed her EF to be unchanged- 25% and she was referred to Dr Bensimhon (her appointment is 01/08/2020).  She had previoulsy declined Entresto secondary to possible side effects.  She was worried about headaches.  She already has issues with chronic pain which she attributes to arthritis.  She had been going to a pain clinic but was released secondary to marijuana use which she tells me helps her more than pain pills.   From a cardiac standpoint she appears to be doing well, she denies increased weight or chest pain c/w angina.  She is compliant with her medications. She tries to watch her sodium intake.  She had been keeping up with her PCP visits but  got behind in her payments and hasn't seen him in several months.    Past Medical History:  Diagnosis Date  . Anxiety   . Arthritis   . Asthma   . Barrett's esophagus   . Chest pain   . Collagen vascular disease (HCC)   . Colon polyps   . Coronary artery disease   . Depression   . Dyslipidemia   . GERD (gastroesophageal reflux disease)    occ  . Hyperlipidemia   . Hypertension   . Myocardial infarct (HCC) 2004,2012  . Sciatica   . Sleep apnea    no CPAP ordered but using oxygen at bedtime  . Tobacco abuse     Past Surgical History:  Procedure Laterality Date  . CARDIAC CATHETERIZATION  10/09/2003   [facility] ([place], [place]) - LAD with 30% prox narrowing, 50% stenosis in mid-portion of PLA; RCA with 40% narrowing proximally (Dr. John Starr, [name])  . CARDIAC CATHETERIZATION  10/10/2007   70% stenosis in first septal perforator branch of LAD, 60-70% narrowing in mid LAD, 20% narrowing in mid AV groove Cfx with 80% diffuse narrowing in small distal marginal, total occlusion of mid RCA with L to R collaterals, 90% stenosis diffusely in prox branch of RCA followed by 70% stenosis in secondary curve & 80% stenosis in small marginal branch (Dr. T. Kelly)  . CARDIAC CATHETERIZATION  05/28/2008   normal L main, RCA with 100% prox lesion w/distal filling   from LAD collaterals, LAD with 20% prox tubular lesion/ 40% mid LAD lesion/previous stent patent ([name])  . CARDIAC CATHETERIZATION  09/15/2009   discrete 100% osital RCA lesion, 50% prox LAD lesion, non-obstructive disease in all coronaries ([name])  . CESAREAN SECTION    . COLONOSCOPY    . COLONOSCOPY W/ POLYPECTOMY    . CORONARY ANGIOPLASTY WITH STENT PLACEMENT  10/31/2007   PCI of distal Cfx with 2.25x44m Taxus Adam DES, 60% narrowing of mid LAD (Dr. TCorky Downs  . CORONARY ANGIOPLASTY WITH STENT PLACEMENT  06/11/2011   PCI of prox-mid LAD with 3x165mDES Resolute (Dr. T.[name] . CORONARY STENT INTERVENTION  N/A 06/19/2019   Procedure: CORONARY STENT INTERVENTION;  Surgeon: BeLorretta HarpMD;  [facility];  Service: Cardiovascular;  Laterality: N/A;  LAD  . DILATION AND CURETTAGE, DIAGNOSTIC / THERAPEUTIC    . DILITATION & CURRETTAGE/HYSTROSCOPY WITH HYDROTHERMAL ABLATION N/A 04/03/2016   Procedure: DILATATION & CURETTAGE/HYSTEROSCOPY WITH HYDROTHERMAL ABLATION;  Surgeon: ChShelly BombardMD;  [facility];  Service: Gynecology;  Laterality: N/A;  . NM MYOCAR PERF WALL MOTION  08/2009   persantine myoview - normal perfusion in all regions, perfusion defect in anterior region (breast attenuation), EF 52%, low risk scan  . RIGHT/LEFT HEART CATH AND CORONARY ANGIOGRAPHY N/A 06/15/2019   Procedure: RIGHT/LEFT HEART CATH AND CORONARY ANGIOGRAPHY;  Surgeon: SmBelva CromeMD;  [facility];  Service: Cardiovascular;  Laterality: N/A;    Current Medications: Current Meds  Medication Sig  . acetaminophen-codeine (TYLENOL #3) 300-30 MG tablet Take 1 tablet by mouth every 6 (six) hours as needed for severe pain.  . Marland Kitchenlbuterol (PROVENTIL HFA;VENTOLIN HFA) 108 (90 BASE) MCG/ACT inhaler Inhale 2 puffs into the lungs every 6 (six) hours as needed for wheezing or shortness of breath.  . Marland Kitchenlbuterol (PROVENTIL) (2.5 MG/3ML) 0.083% nebulizer solution Take 6 mLs by nebulization every 6 (six) hours as needed for wheezing or shortness of breath.  . Marland Kitchenspirin EC 81 MG EC tablet Take 1 tablet (81 mg total) by mouth daily.  . Marland Kitchentorvastatin (LIPITOR) 80 MG tablet Take 1 tablet (80 mg total) by mouth daily.  . Marland KitchenuPROPion (WELLBUTRIN XL) 150 MG 24 hr tablet Take 1 tablet (150 mg total) by mouth daily. FOR SMOKING  . cetirizine (ZYRTEC) 10 MG tablet Take 10 mg by mouth daily as needed for allergies.  . Marland Kitchenlopidogrel (PLAVIX) 75 MG tablet Take 1 tablet (75 mg total) by mouth daily with breakfast.  . dicyclomine (BENTYL) 20 MG tablet Take 1 tablet (20 mg total) by mouth 3 (three) times daily  before meals.  . fluticasone-salmeterol (ADVAIR HFA) 115-21 MCG/ACT inhaler Inhale 2 puffs into the lungs 2 (two) times daily.  . furosemide (LASIX) 40 MG tablet Take 1 tablet (40 mg total) by mouth daily.  . hydrALAZINE (APRESOLINE) 50 MG tablet Take 1 tablet (50 mg total) by mouth every 8 (eight) hours.  . isosorbide mononitrate (IMDUR) 30 MG 24 hr tablet Take 1 tablet (30 mg total) by mouth daily.  . Marland Kitchenidocaine (XYLOCAINE) 2 % solution Use as directed 3 mLs in the mouth or throat 3 (three) times daily before meals. Swish and swallow 3 mLs before meals as needed  . losartan (COZAAR) 50 MG tablet Take 1 tablet (50 mg total) by mouth daily.  . Marland KitchenARCAN 4 MG/0.1ML LIQD nasal spray kit Place 1 spray into the nose once.   . neomycin-polymyxin b-dexamethasone (MAXITROL) 3.5-10000-0.1 SUSP  Place 1 drop into the left eye 2 (two) times daily.  . nitroGLYCERIN (NITROSTAT) 0.4 MG SL tablet Place 1 tablet (0.4 mg total) under the tongue every 5 (five) minutes as needed for chest pain.  . oxyCODONE (OXY IR/ROXICODONE) 5 MG immediate release tablet Take 1 tablet (5 mg total) by mouth daily as needed for severe pain or breakthrough pain.  . pantoprazole (PROTONIX) 20 MG tablet Take 2 tablets (40 mg total) by mouth 2 (two) times daily.  . spironolactone (ALDACTONE) 25 MG tablet TAKE 1 2 (ONE HALF) TABLET BY MOUTH ONCE DAILY  . tiZANidine (ZANAFLEX) 4 MG tablet Take 4 mg by mouth 2 (two) times daily as needed for muscle spasms.  . Vitamin D, Ergocalciferol, (DRISDOL) 1.25 MG (50000 UT) CAPS capsule Take 50,000 Units by mouth once a week.  . zolpidem (AMBIEN) 10 MG tablet Take 10 mg by mouth at bedtime as needed for sleep.  . [DISCONTINUED] nebivolol (BYSTOLIC) 10 MG tablet Take 20 mg by mouth daily.     Allergies:   Coreg [carvedilol], Other, Amoxicillin, Dilaudid [hydromorphone hcl], Ibuprofen, Latex, and Metoprolol   Social History   Socioeconomic History  . Marital status: Single    Spouse name: Not on  file  . Number of children: 2  . Years of education: 12  . Highest education level: High school graduate  Occupational History  . Not on file  Tobacco Use  . Smoking status: Former Smoker    Packs/day: 0.50    Years: 34.00    Pack years: 17.00    Types: Cigarettes  . Smokeless tobacco: Never Used  Substance and Sexual Activity  . Alcohol use: No    Alcohol/week: 0.0 standard drinks    Comment: seldom  . Drug use: Yes    Types: Marijuana    Comment: 06/19/19  . Sexual activity: Yes    Partners: Male    Birth control/protection: Post-menopausal  Other Topics Concern  . Not on file  Social History Narrative  . Not on file   Social Determinants of Health   Financial Resource Strain: Low Risk   . Difficulty of Paying Living Expenses: Not hard at all  Food Insecurity: No Food Insecurity  . Worried About Running Out of Food in the Last Year: Never true  . Ran Out of Food in the Last Year: Never true  Transportation Needs: No Transportation Needs  . Lack of Transportation (Medical): No  . Lack of Transportation (Non-Medical): No  Physical Activity: Inactive  . Days of Exercise per Week: 0 days  . Minutes of Exercise per Session: 0 min  Stress: Stress Concern Present  . Feeling of Stress : To some extent  Social Connections:   . Frequency of Communication with Friends and Family: Not on file  . Frequency of Social Gatherings with Friends and Family: Not on file  . Attends Religious Services: Not on file  . Active Member of Clubs or Organizations: Not on file  . Attends Club or Organization Meetings: Not on file  . Marital Status: Not on file     Family History: The patient's family history includes Bleeding Disorder in her son; Clotting disorder in her father; Colon cancer in her paternal grandmother; Diabetes in her paternal grandmother and sister; Hypertension in her sister; Leukemia in her mother; Stroke (age of onset: 40) in her sister. There is no history of Stomach  cancer or Pancreatic cancer.  ROS:   Please see the history of present illness.       All other systems reviewed and are negative.  EKGs/Labs/Other Studies Reviewed:    The following studies were reviewed today: Echo Oct 2020  EKG:  EKG is ordered today.  The ekg ordered today demonstrates NSR, HR 95, bi atrial enlargement and Q wave AVL (old) as well as Q in v2-QTc 464  Recent Labs: 06/12/2019: B Natriuretic Peptide 600.7 06/14/2019: Magnesium 2.2 09/17/2019: ALT 20; BUN 17; Creatinine, Ser 1.27; Hemoglobin 13.4; Platelets 288; Potassium 3.2; Sodium 140  Recent Lipid Panel    Component Value Date/Time   CHOL 220 (H) 06/14/2019 0216   TRIG 161 (H) 06/14/2019 0216   HDL 57 06/14/2019 0216   CHOLHDL 3.9 06/14/2019 0216   VLDL 32 06/14/2019 0216   LDLCALC 131 (H) 06/14/2019 0216    Physical Exam:    VS:  BP (!) 155/109   Pulse 95   Ht 5' 1" (1.549 m)   Wt 206 lb (93.4 kg)   LMP  (LMP Unknown) Comment: verified BEFORE imaging  SpO2 99%   BMI 38.92 kg/m     Wt Readings from Last 3 Encounters:  01/04/20 206 lb (93.4 kg)  09/26/19 207 lb 6.4 oz (94.1 kg)  09/17/19 205 lb (93 kg)     GEN:  Overweight AA female, c/o Lt shoulder positional pain HEENT: Normal NECK: No JVD; No carotid bruits CARDIAC: RRR, no murmurs, rubs, gallops RESPIRATORY:  Clear to auscultation without rales, wheezing or rhonchi  ABDOMEN: Soft, non-tender, non-distended MUSCULOSKELETAL:  No edema; No deformity  SKIN: Warm and dry NEUROLOGIC:  Alert and oriented x 3 PSYCHIATRIC:  Normal affect   ASSESSMENT:    Chronic systolic CHF (congestive heart failure) (HCC) Pt admitted 06/11/2019 with CHF and new LVD- EF 25%. Currently appears compensated on exam and by history.  CAD S/P multiple PCIs PCI in [place] OM PCI '08-Dr Kelly LAD PCI/DES 2012- Dr Kelly pLAD PCI with DES 06/19/2019- Dr Berry after patient turned down for CABG. Known occluded RCA with L-R collaterals, 65% mCFX-EF  25%  HTN- Repeat B/P by me 124/88 with large cuff Lt arm  COPD (chronic obstructive pulmonary disease) (HCC) Current smoker- down to less than 1/2 PPD   Ischemic cardiomyopathy New LVD July 2020-EF 25%-no change on F/U echo Oct 2020- referral to CHF clinic made.   Dyslipidemia, goal LDL below 70 LDL 131 July 2020- discharged on high dose statin Rx  Obesity (BMI 30-39.9) BMI 39  CRI-3a GFR 54 Oct 2020  Chronic pain issues- No longer going to pain clinic. C/O Lt shoulder M/S pain today.   PLAN:    She has an appointment with Dr Bensimhon in 4 days. F/U to be determined.   Medication Adjustments/Labs and Tests Ordered: Current medicines are reviewed at length with the patient today.  Concerns regarding medicines are outlined above.  No orders of the defined types were placed in this encounter.  No orders of the defined types were placed in this encounter.   Patient Instructions  Medication Instructions:  Your physician recommends that you continue on your current medications as directed. Please refer to the Current Medication list given to you today. *If you need a refill on your cardiac medications before your next appointment, please call your pharmacy*  Lab Work: None  If you have labs (blood work) drawn today and your tests are completely normal, you will receive your results only by: . MyChart Message (if you have MyChart) OR . A paper copy in the mail If you have any lab   test that is abnormal or we need to change your treatment, we will call you to review the results.  Testing/Procedures: none  Follow-Up: At [facility], you and your health needs are our priority.  As part of our continuing mission to provide you with exceptional heart care, we have created designated Provider Care Teams.  These Care Teams include your primary Cardiologist (physician) and Advanced Practice Providers (APPs -  Physician Assistants and Nurse Practitioners) who all work  together to provide you with the care you need, when you need it.  Your next appointment:    FOLLOW UP IN HEART FAILURE CLINIC  The format for your next appointment:   In Person  Provider:   Dr Daniel Bensimhon  Other Instructions     Signed,  , PA-C  01/04/2020 2:10 PM    [facility] 

## 2020-01-04 NOTE — Patient Instructions (Signed)
Medication Instructions:  Your physician recommends that you continue on your current medications as directed. Please refer to the Current Medication list given to you today. *If you need a refill on your cardiac medications before your next appointment, please call your pharmacy*  Lab Work: None  If you have labs (blood work) drawn today and your tests are completely normal, you will receive your results only by: Marland Kitchen MyChart Message (if you have MyChart) OR . A paper copy in the mail If you have any lab test that is abnormal or we need to change your treatment, we will call you to review the results.  Testing/Procedures: none  Follow-Up: At [facility], you and your health needs are our priority.  As part of our continuing mission to provide you with exceptional heart care, we have created designated Provider Care Teams.  These Care Teams include your primary Cardiologist (physician) and Advanced Practice Providers (APPs -  Physician Assistants and Nurse Practitioners) who all work together to provide you with the care you need, when you need it.  Your next appointment:    [place]  The format for your next appointment:   In Person  Provider:   Dr Glori Bickers  Other Instructions

## 2020-01-05 ENCOUNTER — Emergency Department (HOSPITAL_BASED_OUTPATIENT_CLINIC_OR_DEPARTMENT_OTHER)
Admission: EM | Admit: 2020-01-05 | Discharge: 2020-01-05 | Disposition: A | Discharge status: Home / Self Care | Payer: Medicaid Other | Source: Home / Self Care | Attending: Emergency Medicine | Admitting: Emergency Medicine

## 2020-01-05 ENCOUNTER — Emergency Department (HOSPITAL_COMMUNITY)
Admission: EM | Admit: 2020-01-05 | Discharge: 2020-01-05 | Disposition: A | Discharge status: Home / Self Care | Payer: Medicaid Other | Attending: Emergency Medicine | Admitting: Emergency Medicine

## 2020-01-05 ENCOUNTER — Emergency Department (HOSPITAL_COMMUNITY): Payer: Medicaid Other

## 2020-01-05 DIAGNOSIS — I252 Old myocardial infarction: Secondary | ICD-10-CM | POA: Insufficient documentation

## 2020-01-05 DIAGNOSIS — Z87891 Personal history of nicotine dependence: Secondary | ICD-10-CM | POA: Diagnosis not present

## 2020-01-05 DIAGNOSIS — I2510 Atherosclerotic heart disease of native coronary artery without angina pectoris: Secondary | ICD-10-CM | POA: Insufficient documentation

## 2020-01-05 DIAGNOSIS — I10 Essential (primary) hypertension: Secondary | ICD-10-CM | POA: Insufficient documentation

## 2020-01-05 DIAGNOSIS — J45909 Unspecified asthma, uncomplicated: Secondary | ICD-10-CM | POA: Insufficient documentation

## 2020-01-05 DIAGNOSIS — M25559 Pain in unspecified hip: Secondary | ICD-10-CM

## 2020-01-05 DIAGNOSIS — R52 Pain, unspecified: Secondary | ICD-10-CM | POA: Diagnosis not present

## 2020-01-05 DIAGNOSIS — M79604 Pain in right leg: Secondary | ICD-10-CM | POA: Diagnosis present

## 2020-01-05 DIAGNOSIS — M25551 Pain in right hip: Secondary | ICD-10-CM | POA: Insufficient documentation

## 2020-01-05 MED ORDER — MORPHINE SULFATE (PF) 4 MG/ML IV SOLN
4.0000 mg | Freq: Once | INTRAVENOUS | Status: AC
  Administered 2020-01-05: 4 mg via SUBCUTANEOUS
  Filled 2020-01-05: qty 1

## 2020-01-05 MED ORDER — OXYCODONE HCL 5 MG PO TABS: 5.0000 mg | ORAL_TABLET | ORAL | 0 refills | Status: DC | PRN

## 2020-01-05 NOTE — ED Provider Notes (Signed)
[facility] Emergency Department Provider Note MRN:  [id number]  Arrival date & time: 01/05/20     Chief Complaint   Leg Pain (right leg)   History of Present Illness   Tracey Manning is a 59 y.o. year-old female with a history of collagen vascular disease presenting to the ED with chief complaint of leg pain.  Patient is endorsing right low back, right hip, and right growing pain, moderate to severe, for the past week.  Denies dysuria, no bowel or bladder dysfunction, no numbness or weakness.  Worse with motion, worse with certain positions.  Denies fever, no chest pain or shortness of breath.  Review of Systems  A complete 10 system review of systems was obtained and all systems are negative except as noted in the HPI and PMH.   Patient's Health History    Past Medical History:  Diagnosis Date  . Anxiety   . Arthritis   . Asthma   . Barrett's esophagus   . Chest pain   . Collagen vascular disease ([place])   . Colon polyps   . Coronary artery disease   . Depression   . Dyslipidemia   . GERD (gastroesophageal reflux disease)    occ  . Hyperlipidemia   . Hypertension   . Myocardial infarct ([place]) [id number]  . Sciatica   . Sleep apnea    no CPAP ordered but using oxygen at bedtime  . Tobacco abuse     Past Surgical History:  Procedure Laterality Date  . CARDIAC CATHETERIZATION  10/09/2003   [name]) - LAD with 30% prox narrowing, 50% stenosis in mid-portion of PLA; RCA with 40% narrowing proximally ([name], [name])  . CARDIAC CATHETERIZATION  10/10/2007   70% stenosis in first septal perforator branch of LAD, 60-70% narrowing in mid LAD, 20% narrowing in mid AV groove Cfx with 80% diffuse narrowing in small distal marginal, total occlusion of mid RCA with L to R collaterals, 90% stenosis diffusely in prox branch of RCA followed by 70% stenosis in secondary curve & 80% stenosis in small marginal branch ([name])    . CARDIAC CATHETERIZATION  05/28/2008   normal L main, RCA with 100% prox lesion w/distal filling from LAD collaterals, LAD with 20% prox tubular lesion/ 40% mid LAD lesion/previous stent patent ([name])  . CARDIAC CATHETERIZATION  09/15/2009   discrete 100% osital RCA lesion, 50% prox LAD lesion, non-obstructive disease in all coronaries ([name])  . CESAREAN SECTION    . COLONOSCOPY    . COLONOSCOPY W/ POLYPECTOMY    . CORONARY ANGIOPLASTY WITH STENT PLACEMENT  10/31/2007   PCI of distal Cfx with 2.25x44mm Taxus Adam DES, 60% narrowing of mid LAD ([name])  . CORONARY ANGIOPLASTY WITH STENT PLACEMENT  06/11/2011   PCI of prox-mid LAD with 3x50mm DES Resolute ([name])  . CORONARY STENT INTERVENTION N/A 06/19/2019   Procedure: CORONARY STENT INTERVENTION;  Surgeon: [name], MD;  Location: [facility];  Service: Cardiovascular;  Laterality: N/A;  LAD  . DILATION AND CURETTAGE, DIAGNOSTIC / THERAPEUTIC    . DILITATION & CURRETTAGE/HYSTROSCOPY WITH HYDROTHERMAL ABLATION N/A 04/03/2016   Procedure: DILATATION & CURETTAGE/HYSTEROSCOPY WITH HYDROTHERMAL ABLATION;  Surgeon: [name], MD;  Location: [facility];  Service: Gynecology;  Laterality: N/A;  . NM MYOCAR PERF WALL MOTION  08/2009   persantine myoview - normal perfusion in all regions, perfusion defect in anterior region (breast attenuation),  EF 52%, low risk scan  . RIGHT/LEFT HEART CATH AND CORONARY ANGIOGRAPHY N/A 06/15/2019   Procedure: RIGHT/LEFT HEART CATH AND CORONARY ANGIOGRAPHY;  Surgeon: Belva Crome, MD;  Location: [facility];  Service: Cardiovascular;  Laterality: N/A;    Family History  Problem Relation Age of Onset  . Leukemia Mother   . Clotting disorder Father        blood clot  . Hypertension Sister   . Diabetes Sister   . Stroke Sister 56  . Bleeding Disorder Son        ITP "free bleeding disorder"  . Colon cancer Paternal Grandmother   . Diabetes Paternal Grandmother    . Stomach cancer Neg Hx   . Pancreatic cancer Neg Hx     Social History   Socioeconomic History  . Marital status: Single    Spouse name: Not on file  . Number of children: 2  . Years of education: 65  . Highest education level: High school graduate  Occupational History  . Not on file  Tobacco Use  . Smoking status: Former Smoker    Packs/day: 0.50    Years: 34.00    Pack years: 17.00    Types: Cigarettes  . Smokeless tobacco: Never Used  Substance and Sexual Activity  . Alcohol use: No    Alcohol/week: 0.0 standard drinks    Comment: seldom  . Drug use: Yes    Types: Marijuana    Comment: 06/19/19  . Sexual activity: Yes    Partners: Male    Birth control/protection: Post-menopausal  Other Topics Concern  . Not on file  Social History Narrative  . Not on file   Social Determinants of Health   Financial Resource Strain: Low Risk   . Difficulty of Paying Living Expenses: Not hard at all  Food Insecurity: No Food Insecurity  . Worried About Charity fundraiser in the Last Year: Never true  . Ran Out of Food in the Last Year: Never true  Transportation Needs: No Transportation Needs  . Lack of Transportation (Medical): No  . Lack of Transportation (Non-Medical): No  Physical Activity: Inactive  . Days of Exercise per Week: 0 days  . Minutes of Exercise per Session: 0 min  Stress: Stress Concern Present  . Feeling of Stress : To some extent  Social Connections:   . Frequency of Communication with Friends and Family: Not on file  . Frequency of Social Gatherings with Friends and Family: Not on file  . Attends Religious Services: Not on file  . Active Member of Clubs or Organizations: Not on file  . Attends Archivist Meetings: Not on file  . Marital Status: Not on file  Intimate Partner Violence:   . Fear of Current or Ex-Partner: Not on file  . Emotionally Abused: Not on file  . Physically Abused: Not on file  . Sexually Abused: Not on file      Physical Exam   Vitals:   01/05/20 1322  BP: (!) 132/91  Pulse: 88  Resp: 16  Temp: 98.2 F (36.8 C)  SpO2: 97%    CONSTITUTIONAL: Well-appearing, NAD NEURO:  Alert and oriented x 3, no focal deficits EYES:  eyes equal and reactive ENT/NECK:  no LAD, no JVD CARDIO: Regular rate, well-perfused, normal S1 and S2 PULM:  CTAB no wheezing or rhonchi GI/GU:  normal bowel sounds, non-distended, non-tender MSK/SPINE:  No gross deformities, no edema, preserved range of motion of the right hip  and knee SKIN:  no rash, atraumatic PSYCH:  Appropriate speech and behavior  *Additional and/or pertinent findings included in MDM below  Diagnostic and Interventional Summary    EKG Interpretation  Date/Time:    Ventricular Rate:    PR Interval:    QRS Duration:   QT Interval:    QTC Calculation:   R Axis:     Text Interpretation:        Cardiac Monitoring Interpretation:  Labs Reviewed - No data to display  DG Hip Unilat W or Wo Pelvis 2-3 Views Right  Final Result    VAS [place] LOWER EXTREMITY VENOUS (DVT) (ONLY [facility] & [facility])        Medications  morphine 4 MG/ML injection 4 mg (4 mg Subcutaneous Given 01/05/20 1407)     Procedures  /  Critical Care Procedures  ED Course and Medical Decision Making  I have reviewed the triage vital signs, the nursing notes, and pertinent available records from the EMR.  Pertinent labs & imaging results that were available during my care of the patient were reviewed by me and considered in my medical decision making (see below for details).     Nontraumatic low back/right hip pain, radiating to the groin.  Considering pyelonephritis, UTI, musculoskeletal pain, DVT, osteoarthritis.  Given the preserved range of motion, lack of fever, little to no concern for septic joint.  Will obtain x-ray and ultrasound and reassess.  3 PM update: X-ray revealing degenerative changes but no acute fracture, consistent with underlying arthritis.  DVT ultrasound  is negative.  Patient is feeling better and is ambulatory and is appropriate for discharge.  Patient is restricted with her over-the-counter medication use, unable to use ibuprofen.  Explains the Tylenol is not working.  Providing short course oxycodone, but advised to follow-up with PCP for longer term pain management.  Barth Kirks. Sedonia Small, [place] [email or web address]  Final Clinical Impressions(s) / ED Diagnoses     ICD-10-CM   1. Hip pain  M25.559     ED Discharge Orders         Ordered    oxyCODONE (ROXICODONE) 5 MG immediate release tablet  Every 4 hours PRN     01/05/20 1509           Discharge Instructions Discussed with and Provided to Patient:     Discharge Instructions     You were evaluated in the Emergency Department and after careful evaluation, we did not find any emergent condition requiring admission or further testing in the hospital.  Your exam/testing today is overall reassuring.  Your symptoms seem to be due to arthritis.  It is important that you follow-up with your primary care doctor and/or an orthopedic specialist.  Please return to the Emergency Department if you experience any worsening of your condition.  We encourage you to follow up with a primary care provider.  Thank you for allowing [place] to be a part of your care.       Maudie Flakes, MD 01/05/20 [phone]

## 2020-01-05 NOTE — Progress Notes (Signed)
Right lower extremity venous duplex has been completed. Preliminary results can be found in CV Proc through chart review.  Results were given to Dr. Sedonia Small.  01/05/20 1:59 PM Tracey Manning RVT

## 2020-01-05 NOTE — Discharge Instructions (Signed)
You were evaluated in the Emergency Department and after careful evaluation, we did not find any emergent condition requiring admission or further testing in the hospital.  Your exam/testing today is overall reassuring.  Your symptoms seem to be due to arthritis.  It is important that you follow-up with your primary care doctor and/or an orthopedic specialist.  Please return to the Emergency Department if you experience any worsening of your condition.  We encourage you to follow up with a primary care provider.  Thank you for allowing [place] to be a part of your care.

## 2020-01-05 NOTE — ED Triage Notes (Signed)
Per EMS, patient comes from home, c/o right leg, thigh pain. Patient wants to be checked out as she is worried about blood clots due to family history.

## 2020-01-08 ENCOUNTER — Telehealth (HOSPITAL_COMMUNITY): Payer: Self-pay | Admitting: Pharmacist

## 2020-01-08 ENCOUNTER — Other Ambulatory Visit: Payer: Self-pay

## 2020-01-08 ENCOUNTER — Ambulatory Visit (HOSPITAL_COMMUNITY)
Admission: RE | Admit: 2020-01-08 | Discharge: 2020-01-08 | Disposition: A | Discharge status: Home / Self Care | Payer: Medicaid Other | Source: Ambulatory Visit | Attending: Internal Medicine | Admitting: Internal Medicine

## 2020-01-08 ENCOUNTER — Encounter (HOSPITAL_COMMUNITY): Payer: Self-pay | Admitting: Internal Medicine

## 2020-01-08 VITALS — BP 120/94 | HR 95 | Wt 206.3 lb

## 2020-01-08 DIAGNOSIS — E876 Hypokalemia: Secondary | ICD-10-CM | POA: Insufficient documentation

## 2020-01-08 DIAGNOSIS — M79606 Pain in leg, unspecified: Secondary | ICD-10-CM | POA: Diagnosis not present

## 2020-01-08 DIAGNOSIS — I5022 Chronic systolic (congestive) heart failure: Secondary | ICD-10-CM

## 2020-01-08 DIAGNOSIS — G4730 Sleep apnea, unspecified: Secondary | ICD-10-CM | POA: Insufficient documentation

## 2020-01-08 DIAGNOSIS — M1990 Unspecified osteoarthritis, unspecified site: Secondary | ICD-10-CM | POA: Diagnosis not present

## 2020-01-08 DIAGNOSIS — Z79899 Other long term (current) drug therapy: Secondary | ICD-10-CM | POA: Diagnosis not present

## 2020-01-08 DIAGNOSIS — J449 Chronic obstructive pulmonary disease, unspecified: Secondary | ICD-10-CM | POA: Insufficient documentation

## 2020-01-08 DIAGNOSIS — I110 Hypertensive heart disease with heart failure: Secondary | ICD-10-CM | POA: Diagnosis not present

## 2020-01-08 DIAGNOSIS — G8929 Other chronic pain: Secondary | ICD-10-CM | POA: Diagnosis not present

## 2020-01-08 DIAGNOSIS — I252 Old myocardial infarction: Secondary | ICD-10-CM | POA: Diagnosis not present

## 2020-01-08 DIAGNOSIS — Z7982 Long term (current) use of aspirin: Secondary | ICD-10-CM | POA: Diagnosis not present

## 2020-01-08 DIAGNOSIS — I25110 Atherosclerotic heart disease of native coronary artery with unstable angina pectoris: Secondary | ICD-10-CM | POA: Insufficient documentation

## 2020-01-08 DIAGNOSIS — E785 Hyperlipidemia, unspecified: Secondary | ICD-10-CM | POA: Diagnosis not present

## 2020-01-08 DIAGNOSIS — Z87891 Personal history of nicotine dependence: Secondary | ICD-10-CM | POA: Insufficient documentation

## 2020-01-08 DIAGNOSIS — J439 Emphysema, unspecified: Secondary | ICD-10-CM

## 2020-01-08 DIAGNOSIS — I255 Ischemic cardiomyopathy: Secondary | ICD-10-CM

## 2020-01-08 DIAGNOSIS — E669 Obesity, unspecified: Secondary | ICD-10-CM | POA: Diagnosis not present

## 2020-01-08 DIAGNOSIS — I2510 Atherosclerotic heart disease of native coronary artery without angina pectoris: Secondary | ICD-10-CM

## 2020-01-08 DIAGNOSIS — M25559 Pain in unspecified hip: Secondary | ICD-10-CM | POA: Diagnosis not present

## 2020-01-08 DIAGNOSIS — Z955 Presence of coronary angioplasty implant and graft: Secondary | ICD-10-CM | POA: Diagnosis not present

## 2020-01-08 LAB — BASIC METABOLIC PANEL
Anion gap: 9 (ref 5–15)
BUN: 12 mg/dL (ref 6–20)
CO2: 25 mmol/L (ref 22–32)
Calcium: 9.5 mg/dL (ref 8.9–10.3)
Chloride: 103 mmol/L (ref 98–111)
Creatinine, Ser: 1.18 mg/dL — ABNORMAL HIGH (ref 0.44–1.00)
GFR calc Af Amer: 59 mL/min — ABNORMAL LOW (ref >60)
GFR calc non Af Amer: 51 mL/min — ABNORMAL LOW (ref >60)
Glucose, Bld: 89 mg/dL (ref 70–99)
Potassium: 3.4 mmol/L — ABNORMAL LOW (ref 3.5–5.1)
Sodium: 137 mmol/L (ref 135–145)

## 2020-01-08 LAB — BRAIN NATRIURETIC PEPTIDE: B Natriuretic Peptide: 248.9 pg/mL — ABNORMAL HIGH (ref 0.0–100.0)

## 2020-01-08 MED ORDER — POTASSIUM CHLORIDE ER 10 MEQ PO TBCR: 20.0000 meq | EXTENDED_RELEASE_TABLET | Freq: Every day | ORAL | 3 refills | Status: DC

## 2020-01-08 MED ORDER — ENTRESTO 49-51 MG PO TABS: 1.0000 | ORAL_TABLET | Freq: Two times a day (BID) | ORAL | 3 refills | Status: DC

## 2020-01-08 MED ORDER — SPIRONOLACTONE 25 MG PO TABS: 25.0000 mg | ORAL_TABLET | Freq: Every day | ORAL | 3 refills | Status: DC

## 2020-01-08 NOTE — Telephone Encounter (Signed)
Advanced Heart Failure Patient Advocate Encounter  Prior Authorization for [name] has been approved.    PA# [id number] W Effective dates: 01/08/2020 - 01/07/2021  Patients co-pay is $3.00  Audry Riles, PharmD, BCPS, BCCP, CPP Heart Failure Clinic Pharmacist [phone]

## 2020-01-08 NOTE — Patient Instructions (Signed)
Stop Losartan  Start Entresto 49/51 mg Twice daily   Increase Spironolactone 25 mg (1 tab) daily  Start Potassium 20 meq (2 tabs) daily  Labs done today, we will notify you of abnormal results  Your physician recommends that you schedule a follow-up appointment in: 6 weeks  If you have any questions or concerns before your next appointment please send [place] a message through [place] or call our office at [phone].  At the [facility], you and your health needs are our priority. As part of our continuing mission to provide you with exceptional heart care, we have created designated Provider Care Teams. These Care Teams include your primary Cardiologist (physician) and Advanced Practice Providers (APPs- Physician Assistants and Nurse Practitioners) who all work together to provide you with the care you need, when you need it.   You may see any of the following providers on your designated Care Team at your next follow up: Marland Kitchen Dr Glori Bickers . Dr Loralie Champagne . [name], NP . Lyda Jester, PA . Audry Riles, PharmD   Please be sure to bring in all your medications bottles to every appointment.

## 2020-01-08 NOTE — Progress Notes (Signed)
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 Gwenlyn Found for further HF evaluation.   She had a remote PCI in 2004 in [place]. In 2008 she had a circumflex PCI by Dr. Claiborne Billings. She had known occluded RCA with left-to-right collaterals. In 2012 she had an LAD stent placedby Dr. Claiborne Billings.  She was admitted 06/11/2019 with acute respiratory failure and unstable angina. She had new LV dysfunction with an EF of 25%. She had acute kidney injury as well with a creatinine of 1.8. the patient was admitted, diuresed, and underwent diagnostic catheterization 06/15/2019. This revealed a 60 to 70% mid circumflex stenosis, known occluded RCA with left-to-right collaterals, and a 95% proximal LAD before the previously placed stent which was 50 to 60% narrowed. After review the patient was turned down for CABG and she underwent intervention to her LAD on 06/19/2019 by Dr. Gwenlyn Found.  She had a f/u echo which revealed her EF to be unchanged at 25-30%.  She had previoulsy declined Entresto secondary to possible side effects.  She was worried about headaches.  She already has issues with chronic pain which she attributes to arthritis.  She had been going to a pain clinic but was released secondary to marijuana use which she tells me helps her more than pain pills. She continues to smoke 5-10 cigs/day  Says it is hard for her to get around due to pain in her hip and leg. Says breathing "pretty much ok" except when she lies down. She has to sit up for a minute and then can lie back down. Only occasional CP. Takes lasix 40 mg daily and some times has to take another one at night.     Review of Systems: [y] = yes, [ ]  = no   General: Weight gain [ ] ; Weight loss [  ]; Anorexia [ ] ; Fatigue Blue.Reese ]; Fever [ ] ; Chills [ ] ; Weakness [ y]  Cardiac: Chest pain/pressure Blue.Reese ]; Resting SOB [ ] ; Exertional SOB Blue.Reese ]; Orthopnea [ y]; Pedal Edema Blue.Reese ]; Palpitations [ ] ; Syncope [ ] ; Presyncope [ ] ; Paroxysmal nocturnal dyspnea[y ]  Pulmonary: Cough [ ] ; Pryor Montes ]; Hemoptysis[ ] ; Sputum [ ] ; Snoring [ ]   GI: Vomiting[ ] ; Dysphagia[ ] ; Melena[ ] ; Hematochezia [ ] ; Heartburn[ ] ; Abdominal pain [ ] ; Constipation [ ] ; Diarrhea [ ] ; BRBPR [ ]   GU: Hematuria[ ] ; Dysuria [ ] ; Nocturia[ ]   Vascular: Pain in legs with walking Blue.Reese ]; Pain in feet with lying flat [ ] ; Non-healing sores [ ] ; Stroke [ ] ; TIA [ ] ; Slurred speech [ ] ;  Neuro: Headaches[y ]; Vertigo[ ] ; Seizures[ ] ; Paresthesias[ ] ;Blurred vision [ ] ; Diplopia [ ] ; Vision changes [ ]   Ortho/Skin: Arthritis Blue.Reese ]; Joint pain Blue.Reese ]; Muscle pain Blue.Reese ]; Joint swelling Blue.Reese ]; Back Pain [ y]; Rash [ ]   Psych: Depression[y ]; Anxiety[ ]   Heme: Bleeding problems [ ] ; Clotting disorders [ ] ; Anemia [ ]   Endocrine: Diabetes Blue.Reese ]; Thyroid dysfunction[ ]    Past Medical History:  Diagnosis Date  . Anxiety   . Arthritis   . Asthma   . Barrett's esophagus   . Chest pain   . Collagen vascular disease ([place])   . Colon polyps   .  Coronary artery disease   . Depression   . Dyslipidemia   . GERD (gastroesophageal reflux disease)    occ  . Hyperlipidemia   . Hypertension   . Myocardial infarct ([place]) [id number]  . Sciatica   . Sleep apnea    no CPAP ordered but using oxygen at bedtime  . Tobacco abuse     Current Outpatient Medications  Medication Sig Dispense Refill  . albuterol (PROVENTIL HFA;VENTOLIN HFA) 108 (90 BASE) MCG/ACT inhaler Inhale 2 puffs into the lungs every 6 (six) hours as needed for wheezing or shortness of breath.    Marland Kitchen albuterol (PROVENTIL) (2.5 MG/3ML) 0.083% nebulizer solution Take 6 mLs by nebulization every 6 (six) hours as needed for wheezing or shortness of breath.    Marland Kitchen aspirin EC 81 MG EC tablet Take 1  tablet (81 mg total) by mouth daily. 60 tablet 1  . atorvastatin (LIPITOR) 80 MG tablet Take 1 tablet (80 mg total) by mouth daily. 60 tablet 0  . clopidogrel (PLAVIX) 75 MG tablet Take 1 tablet (75 mg total) by mouth daily with breakfast. 60 tablet 1  . fluticasone-salmeterol (ADVAIR HFA) 115-21 MCG/ACT inhaler Inhale 2 puffs into the lungs 2 (two) times daily. 1 Inhaler 12  . furosemide (LASIX) 40 MG tablet Take 1 tablet (40 mg total) by mouth daily. 60 tablet 0  . hydrALAZINE (APRESOLINE) 50 MG tablet Take 1 tablet (50 mg total) by mouth every 8 (eight) hours. 90 tablet 1  . isosorbide mononitrate (IMDUR) 30 MG 24 hr tablet Take 1 tablet (30 mg total) by mouth daily. 30 tablet 1  . losartan (COZAAR) 50 MG tablet Take 1 tablet (50 mg total) by mouth daily. 60 tablet 0  . NARCAN 4 MG/0.1ML LIQD nasal spray kit Place 1 spray into the nose once.     . nitroGLYCERIN (NITROSTAT) 0.4 MG SL tablet Place 1 tablet (0.4 mg total) under the tongue every 5 (five) minutes as needed for chest pain. 25 tablet 6  . spironolactone (ALDACTONE) 25 MG tablet TAKE 1 2 (ONE HALF) TABLET BY MOUTH ONCE DAILY    . VOLTAREN 1 % GEL SMARTSIG:4 Gram(s) Topical 4 Times Daily PRN    . zolpidem (AMBIEN) 10 MG tablet Take 10 mg by mouth at bedtime as needed for sleep.     No current facility-administered medications for this encounter.    Allergies  Allergen Reactions  . Coreg [Carvedilol] Other (See Comments)    Headache  . Other Anaphylaxis and Hives    Fresh strawberries (throat swelled)  . Amoxicillin Nausea And Vomiting    Did it involve swelling of the face/tongue/throat, SOB, or low BP? Yes Did it involve sudden or severe rash/hives, skin peeling, or any reaction on the inside of your mouth or nose? Yes Did you need to seek medical attention at a hospital or doctor's office? Yes When did it last happen?within last 10 years If all above answers are "NO", may proceed with cephalosporin use.   . Dilaudid  [Hydromorphone Hcl] Nausea And Vomiting  . Ibuprofen Other (See Comments)    wheezing  . Latex Swelling    No reaction with bandaids  . Metoprolol     HEADACHE and Wheezing      Social History   Socioeconomic History  . Marital status: Single    Spouse name: Not on file  . Number of children: 2  . Years of education: 65  . Highest education level: High school graduate  Occupational History  .  Not on file  Tobacco Use  . Smoking status: Former Smoker    Packs/day: 0.50    Years: 34.00    Pack years: 17.00    Types: Cigarettes  . Smokeless tobacco: Never Used  Substance and Sexual Activity  . Alcohol use: No    Alcohol/week: 0.0 standard drinks    Comment: seldom  . Drug use: Yes    Types: Marijuana    Comment: 06/19/19  . Sexual activity: Yes    Partners: Male    Birth control/protection: Post-menopausal  Other Topics Concern  . Not on file  Social History Narrative  . Not on file   Social Determinants of Health   Financial Resource Strain: Low Risk   . Difficulty of Paying Living Expenses: Not hard at all  Food Insecurity: No Food Insecurity  . Worried About Charity fundraiser in the Last Year: Never true  . Ran Out of Food in the Last Year: Never true  Transportation Needs: No Transportation Needs  . Lack of Transportation (Medical): No  . Lack of Transportation (Non-Medical): No  Physical Activity: Inactive  . Days of Exercise per Week: 0 days  . Minutes of Exercise per Session: 0 min  Stress: Stress Concern Present  . Feeling of Stress : To some extent  Social Connections:   . Frequency of Communication with Friends and Family: Not on file  . Frequency of Social Gatherings with Friends and Family: Not on file  . Attends Religious Services: Not on file  . Active Member of Clubs or Organizations: Not on file  . Attends Archivist Meetings: Not on file  . Marital Status: Not on file  Intimate Partner Violence:   . Fear of Current or  Ex-Partner: Not on file  . Emotionally Abused: Not on file  . Physically Abused: Not on file  . Sexually Abused: Not on file      Family History  Problem Relation Age of Onset  . Leukemia Mother   . Clotting disorder Father        blood clot  . Hypertension Sister   . Diabetes Sister   . Stroke Sister 69  . Bleeding Disorder Son        ITP "free bleeding disorder"  . Colon cancer Paternal Grandmother   . Diabetes Paternal Grandmother   . Stomach cancer Neg Hx   . Pancreatic cancer Neg Hx     Vitals:   01/08/20 1141 01/08/20 1145  BP: (!) 142/110 (!) 120/94  Pulse: 95   SpO2: 97%   Weight: 93.6 kg (206 lb 4.8 oz)     PHYSICAL EXAM: General:  Obese woman lying on the exam table moaning in pain from her R hip  HEENT: normal Neck: supple. no  Obvious JVD. Carotids 2+ bilat; no bruits. No lymphadenopathy or thryomegaly appreciated. Cor: PMI nondisplaced. Regular rate & rhythm. No rubs, gallops or murmurs. Lungs: clear decreased breath sounds no wheeze Abdomen:  Markedly obese soft, nontender, nondistended. No hepatosplenomegaly. No bruits or masses. Good bowel sounds. Extremities: no cyanosis, clubbing, rash, edema Neuro: alert & oriented x 3, cranial nerves grossly intact. moves all 4 extremities w/o difficulty. Affect pleasant.  ECG: NSR 89 LAE nonspecific TWI Personally reviewed    ASSESSMENT & PLAN:  1. Chronic systolic CHF due to iCM - echo 10/20 EF 25-30% - NYHA [name] but limited mostly by hip pain - Volume status ok.  - Will switch losartan to Entresto 49/51 bid - Increase spiro  to 25 (She has hypokalemia) - Continue hydralazine 50 tid - Imdur 30 daily  - Not taking large Kcl pills. Will switch to 40mq tabs - Has not been on b-blocker due to wheezing from asthma. Can consider trial of bisoprolol in future - Consider SGLT2i soon - Stressed need for weight loss - Labs today and in 1 week   2. CAD S/P multiple PCIs [place] s/p [place] PCI  '08-Dr KClaiborne Billings- s/p LAD PCI/DES 2012 - s/p pLAD PCI with DES 06/19/2019- Dr BGwenlyn Foundafter patient turned down for CABG. Known occluded RCA with L-R collaterals, 65% mCFX-EF 25% - no s/s of ischemia - continue statin and DAPT  3. HTN - BP mildly elevated   4. COPD with ongoing tobacco use - smoking 1/2 PPD  - encouraged to quite  5. Osteoarthritis - suggested Ortho eval  DGlori Bickers MD  8:28 AM

## 2020-01-16 ENCOUNTER — Telehealth: Payer: Self-pay | Admitting: Cardiovascular Disease

## 2020-01-16 NOTE — Telephone Encounter (Signed)
Pt c/o medication issue:  1. Name of Medication: sacubitril-valsartan (ENTRESTO) 49-51 MG  2. How are you currently taking this medication (dosage and times per day)? As directed  3. Are you having a reaction (difficulty breathing--STAT)? no  4. What is your medication issue? Patient states that this medication "is out of her league". She states that something needs to be done lower the cost of this medication because $600 is way to much for her to spend out of pocket. She states that this medication is working really well for her and she does not want to have to switch. She is requesting to speak with Kerin Ransom

## 2020-01-16 NOTE — Telephone Encounter (Signed)
Returned call to patient, she was wondering if she is going to need another authorization to get this medication.  Advised per chart review, PA was completed and is good until 2022, copay $3.   Patient aware and verbalized understanding.

## 2020-01-22 ENCOUNTER — Telehealth (HOSPITAL_COMMUNITY): Payer: Self-pay | Admitting: *Deleted

## 2020-01-22 DIAGNOSIS — I5022 Chronic systolic (congestive) heart failure: Secondary | ICD-10-CM

## 2020-01-22 NOTE — Telephone Encounter (Signed)
-----   Message from Jolaine Artist, MD sent at 01/21/2020  7:20 PM EST ----- Repeat BMET this week to recheck potassium

## 2020-01-22 NOTE — Telephone Encounter (Signed)
Scarlette Calico, RN  01/22/2020 4:13 PM EST    Pt aware, she will come for lab work tomorrow

## 2020-01-23 ENCOUNTER — Other Ambulatory Visit (HOSPITAL_COMMUNITY): Payer: Medicaid Other

## 2020-01-25 ENCOUNTER — Other Ambulatory Visit: Payer: Self-pay

## 2020-01-25 DIAGNOSIS — I5022 Chronic systolic (congestive) heart failure: Secondary | ICD-10-CM

## 2020-01-25 DIAGNOSIS — I255 Ischemic cardiomyopathy: Secondary | ICD-10-CM

## 2020-01-25 DIAGNOSIS — I2510 Atherosclerotic heart disease of native coronary artery without angina pectoris: Secondary | ICD-10-CM

## 2020-01-26 LAB — BASIC METABOLIC PANEL
BUN/Creatinine Ratio: 15 (ref 9–23)
BUN: 19 mg/dL (ref 6–24)
CO2: 23 mmol/L (ref 20–29)
Calcium: 9.9 mg/dL (ref 8.7–10.2)
Chloride: 103 mmol/L (ref 96–106)
Creatinine, Ser: 1.31 mg/dL — ABNORMAL HIGH (ref 0.57–1.00)
GFR calc Af Amer: 52 mL/min/{1.73_m2} — ABNORMAL LOW (ref >59)
GFR calc non Af Amer: 45 mL/min/{1.73_m2} — ABNORMAL LOW (ref >59)
Glucose: 102 mg/dL — ABNORMAL HIGH (ref 65–99)
Potassium: 4.2 mmol/L (ref 3.5–5.2)
Sodium: 142 mmol/L (ref 134–144)

## 2020-02-19 ENCOUNTER — Encounter (HOSPITAL_COMMUNITY): Payer: Self-pay

## 2020-02-19 ENCOUNTER — Other Ambulatory Visit: Payer: Self-pay

## 2020-02-19 ENCOUNTER — Ambulatory Visit (HOSPITAL_COMMUNITY)
Admission: RE | Admit: 2020-02-19 | Discharge: 2020-02-19 | Disposition: A | Discharge status: Home / Self Care | Payer: Medicaid Other | Source: Ambulatory Visit | Attending: Cardiology | Admitting: Cardiology

## 2020-02-19 VITALS — BP 128/82 | HR 84 | Wt 206.0 lb

## 2020-02-19 DIAGNOSIS — M25559 Pain in unspecified hip: Secondary | ICD-10-CM | POA: Diagnosis not present

## 2020-02-19 DIAGNOSIS — I5022 Chronic systolic (congestive) heart failure: Secondary | ICD-10-CM | POA: Insufficient documentation

## 2020-02-19 DIAGNOSIS — Z7902 Long term (current) use of antithrombotics/antiplatelets: Secondary | ICD-10-CM | POA: Insufficient documentation

## 2020-02-19 DIAGNOSIS — Z955 Presence of coronary angioplasty implant and graft: Secondary | ICD-10-CM | POA: Diagnosis not present

## 2020-02-19 DIAGNOSIS — I110 Hypertensive heart disease with heart failure: Secondary | ICD-10-CM | POA: Diagnosis not present

## 2020-02-19 DIAGNOSIS — F17210 Nicotine dependence, cigarettes, uncomplicated: Secondary | ICD-10-CM | POA: Diagnosis not present

## 2020-02-19 DIAGNOSIS — M169 Osteoarthritis of hip, unspecified: Secondary | ICD-10-CM | POA: Insufficient documentation

## 2020-02-19 DIAGNOSIS — Z79899 Other long term (current) drug therapy: Secondary | ICD-10-CM | POA: Insufficient documentation

## 2020-02-19 DIAGNOSIS — I25110 Atherosclerotic heart disease of native coronary artery with unstable angina pectoris: Secondary | ICD-10-CM | POA: Insufficient documentation

## 2020-02-19 DIAGNOSIS — E785 Hyperlipidemia, unspecified: Secondary | ICD-10-CM | POA: Diagnosis not present

## 2020-02-19 DIAGNOSIS — M25511 Pain in right shoulder: Secondary | ICD-10-CM | POA: Insufficient documentation

## 2020-02-19 DIAGNOSIS — M25512 Pain in left shoulder: Secondary | ICD-10-CM | POA: Insufficient documentation

## 2020-02-19 DIAGNOSIS — I252 Old myocardial infarction: Secondary | ICD-10-CM | POA: Insufficient documentation

## 2020-02-19 DIAGNOSIS — G4733 Obstructive sleep apnea (adult) (pediatric): Secondary | ICD-10-CM | POA: Insufficient documentation

## 2020-02-19 DIAGNOSIS — Z7982 Long term (current) use of aspirin: Secondary | ICD-10-CM | POA: Diagnosis not present

## 2020-02-19 DIAGNOSIS — M79601 Pain in right arm: Secondary | ICD-10-CM | POA: Insufficient documentation

## 2020-02-19 DIAGNOSIS — J449 Chronic obstructive pulmonary disease, unspecified: Secondary | ICD-10-CM | POA: Insufficient documentation

## 2020-02-19 DIAGNOSIS — M79602 Pain in left arm: Secondary | ICD-10-CM | POA: Diagnosis not present

## 2020-02-19 DIAGNOSIS — R079 Chest pain, unspecified: Secondary | ICD-10-CM

## 2020-02-19 LAB — CBC
HCT: 42.9 % (ref 36.0–46.0)
Hemoglobin: 14.2 g/dL (ref 12.0–15.0)
MCH: 28 pg (ref 26.0–34.0)
MCHC: 33.1 g/dL (ref 30.0–36.0)
MCV: 84.4 fL (ref 80.0–100.0)
Platelets: 222 10*3/uL (ref 150–400)
RBC: 5.08 MIL/uL (ref 3.87–5.11)
RDW: 16.1 % — ABNORMAL HIGH (ref 11.5–15.5)
WBC: 6.6 10*3/uL (ref 4.0–10.5)
nRBC: 0 % (ref 0.0–0.2)

## 2020-02-19 LAB — BASIC METABOLIC PANEL
Anion gap: 12 (ref 5–15)
BUN: 15 mg/dL (ref 6–20)
CO2: 25 mmol/L (ref 22–32)
Calcium: 9.8 mg/dL (ref 8.9–10.3)
Chloride: 101 mmol/L (ref 98–111)
Creatinine, Ser: 1.1 mg/dL — ABNORMAL HIGH (ref 0.44–1.00)
GFR calc Af Amer: >60 mL/min (ref >60)
GFR calc non Af Amer: 55 mL/min — ABNORMAL LOW (ref >60)
Glucose, Bld: 109 mg/dL — ABNORMAL HIGH (ref 70–99)
Potassium: 3.7 mmol/L (ref 3.5–5.1)
Sodium: 138 mmol/L (ref 135–145)

## 2020-02-19 LAB — TROPONIN I (HIGH SENSITIVITY): Troponin I (High Sensitivity): 10 ng/L (ref <18)

## 2020-02-19 MED ORDER — ENTRESTO 97-103 MG PO TABS: 1.0000 | ORAL_TABLET | Freq: Two times a day (BID) | ORAL | 3 refills | Status: DC

## 2020-02-19 NOTE — Patient Instructions (Signed)
INCREASE Entresto to 97/103, one tab twice a day  Labs today We will only contact you if something comes back abnormal or we need to make some changes. Otherwise no news is good news!  You have been referred to [facility] Address: [address], [place], [place] [address] Phone: [phone] -they will be in contact with you for an appointment  Your physician recommends that you schedule a follow-up appointment in: 2-3 weeks  in the Advanced Practitioners (PA/NP) Clinic    Do the following things EVERYDAY: 1) Weigh yourself in the morning before breakfast. Write it down and keep it in a log. 2) Take your medicines as prescribed 3) Eat low salt foods--Limit salt (sodium) to 2000 mg per day.  4) Stay as active as you can everyday 5) Limit all fluids for the day to less than 2 liters  At the [facility], you and your health needs are our priority. As part of our continuing mission to provide you with exceptional heart care, we have created designated Provider Care Teams. These Care Teams include your primary Cardiologist (physician) and Advanced Practice Providers (APPs- Physician Assistants and Nurse Practitioners) who all work together to provide you with the care you need, when you need it.   You may see any of the following providers on your designated Care Team at your next follow up: Marland Kitchen Dr Glori Bickers . Dr Loralie Champagne . [name], NP . Lyda Jester, PA . Audry Riles, PharmD   Please be sure to bring in all your medications bottles to every appointment.

## 2020-02-19 NOTE — Progress Notes (Signed)
ADVANCED HF CLINIC PROGRESS NOTE  Primary Care: Dr. Tacy Dura  Primary Cardiologist: Dr. Gwenlyn Found [facility]: Dr. 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 Gwenlyn Found to [facility] for further HF evaluation.   She had a remote PCI in 2004 in [place]. In 2008 she had a circumflex PCI by Dr. Claiborne Billings. She had known occluded RCA with left-to-right collaterals. In 2012 she had an LAD stent placedby Dr. Claiborne Billings.  She was admitted 06/11/2019 with acute respiratory failure and unstable angina. She had new LV dysfunction with an EF of 25%. She had acute kidney injury as well with a creatinine of 1.8. the patient was admitted, diuresed, and underwent diagnostic catheterization 06/15/2019. This revealed a 60 to 70% mid circumflex stenosis, known occluded RCA with left-to-right collaterals, and a 95% proximal LAD before the previously placed stent which was 50 to 60% narrowed. After review the patient was turned down for CABG and she underwent intervention to her LAD on 06/19/2019 by Dr. Gwenlyn Found.  She had a f/u echo which revealed her EF to be unchanged at 25-30%.  Had initial [facility] consultation w/ Dr. Haroldine Laws 01/08/20. Meds were adjustment. Losartan stopped and Entresto added, 49-51bid. Spironolactone was increased to 25 mg daily. Hydralazine and Imdur continued. It was noted that she had not been on a  blocker due to wheezing from asthma.   She reports back for follow-up.  She is doing well from a heart failure standpoint, denying any resting or exertional dyspnea.  No weight gain, lower extremity edema, orthopnea nor PND.  Reports full medication compliance.  Her main complaint today is bilateral shoulder pain radiating down both arms.  Feels like sharp pain.  Denies any anterior chest pain.  Symptoms exacerbated by deep breathing  and also reproducible with palpation of anterior shoulders.  Not worse with exertion.  Exacerbated by movement of upper extremities. Denies any recent trauma or heavy lifting.  Pain has been ongoing for over 12 hours.  She called EMS to her home last night due to symptoms.  They checked an EKG and she was told that it was normal.  She was advised to go to the ED but declined.  EKG repeated today and shows no acute ST changes.  Blood pressure well controlled at 128/82.  Heart rate 84 bpm.   Review of Systems: [y] = yes, [ ]  = no   General: Weight gain [ ] ; Weight loss [ ] ; Anorexia [ ] ; Fatigue Blue.Reese ]; Fever [ ] ; Chills [ ] ; Weakness [ y]  Cardiac: Chest pain/pressure Blue.Reese ]; Resting SOB [ ] ; Exertional SOB Blue.Reese ]; Orthopnea [ y]; Pedal Edema Blue.Reese ]; Palpitations [ ] ; Syncope [ ] ; Presyncope [ ] ; Paroxysmal nocturnal dyspnea[y ]  Pulmonary: Cough [ ] ; Pryor Montes ]; Hemoptysis[ ] ; Sputum [ ] ; Snoring [ ]   GI: Vomiting[ ] ; Dysphagia[ ] ; Melena[ ] ; Hematochezia [ ] ; Heartburn[ ] ; Abdominal pain [ ] ; Constipation [ ] ; Diarrhea [ ] ; BRBPR [ ]   GU: Hematuria[ ] ; Dysuria [ ] ; Nocturia[ ]   Vascular: Pain in legs with walking Blue.Reese ]; Pain in feet with lying flat [ ] ; Non-healing sores [ ] ; Stroke [ ] ; TIA [ ] ; Slurred speech [ ] ;  Neuro: Headaches[y ]; Vertigo[ ] ; Seizures[ ] ; Paresthesias[ ] ;Blurred vision [ ] ; Diplopia [ ] ; Vision changes [ ]   Ortho/Skin: Arthritis Blue.Reese ]; Joint pain Blue.Reese ]; Muscle pain Blue.Reese ]; Joint swelling Blue.Reese ]; Back Pain [ y]; Rash [ ]   Psych: Depression[y ]; Anxiety[ ]   Heme: Bleeding problems [ ] ; Clotting disorders [ ] ; Anemia [ ]   Endocrine: Diabetes Blue.Reese ]; Thyroid dysfunction[ ]    Past Medical History:  Diagnosis Date  . Anxiety   . Arthritis   . Asthma   . Barrett's esophagus   . Chest pain   . Collagen vascular disease ([place])   . Colon polyps   . Coronary artery disease   . Depression   . Dyslipidemia   . GERD (gastroesophageal reflux disease)    occ  . Hyperlipidemia   .  Hypertension   . Myocardial infarct ([place]) 9924,2683  . Sciatica   . Sleep apnea    no CPAP ordered but using oxygen at bedtime  . Tobacco abuse     Current Outpatient Medications  Medication Sig Dispense Refill  . albuterol (PROVENTIL HFA;VENTOLIN HFA) 108 (90 BASE) MCG/ACT inhaler Inhale 2 puffs into the lungs every 6 (six) hours as needed for wheezing or shortness of breath.    Marland Kitchen albuterol (PROVENTIL) (2.5 MG/3ML) 0.083% nebulizer solution Take 6 mLs by nebulization every 6 (six) hours as needed for wheezing or shortness of breath.    Marland Kitchen aspirin EC 81 MG EC tablet Take 1 tablet (81 mg total) by mouth daily. 60 tablet 1  . atorvastatin (LIPITOR) 80 MG tablet Take 1 tablet (80 mg total) by mouth daily. 60 tablet 0  . clopidogrel (PLAVIX) 75 MG tablet Take 1 tablet (75 mg total) by mouth daily with breakfast. 60 tablet 1  . fluticasone-salmeterol (ADVAIR HFA) 115-21 MCG/ACT inhaler Inhale 2 puffs into the lungs 2 (two) times daily. 1 Inhaler 12  . furosemide (LASIX) 40 MG tablet Take 1 tablet (40 mg total) by mouth daily. 60 tablet 0  . hydrALAZINE (APRESOLINE) 50 MG tablet Take 1 tablet (50 mg total) by mouth every 8 (eight) hours. 90 tablet 1  . isosorbide mononitrate (IMDUR) 30 MG 24 hr tablet Take 1 tablet (30 mg total) by mouth daily. 30 tablet 1  . NARCAN 4 MG/0.1ML LIQD nasal spray kit Place 1 spray into the nose once.     . nitroGLYCERIN (NITROSTAT) 0.4 MG SL tablet Place 1 tablet (0.4 mg total) under the tongue every 5 (five) minutes as needed for chest pain. 25 tablet 6  . potassium chloride (KLOR-CON) 10 MEQ tablet Take 2 tablets (20 mEq total) by mouth daily. 60 tablet 3  . sacubitril-valsartan (ENTRESTO) 49-51 MG Take 1 tablet by mouth 2 (two) times daily. 60 tablet 3  . spironolactone (ALDACTONE) 25 MG tablet Take 1 tablet (25 mg total) by mouth daily. 30 tablet 3  . VOLTAREN 1 % GEL SMARTSIG:4 Gram(s) Topical 4 Times Daily PRN    . zolpidem (AMBIEN) 10 MG tablet Take 10 mg by  mouth at bedtime as needed for sleep.     No current facility-administered medications for this encounter.    Allergies  Allergen Reactions  . Coreg [Carvedilol] Other (See Comments)    Headache  . Other Anaphylaxis and Hives    Fresh strawberries (throat swelled)  . Amoxicillin Nausea And Vomiting    Did it involve swelling of the face/tongue/throat, SOB, or low BP? Yes Did it involve sudden or severe rash/hives, skin peeling, or any reaction on the inside of your mouth or nose? Yes Did you need to seek medical attention at  a hospital or doctor's office? Yes When did it last happen?within last 10 years If all above answers are "NO", may proceed with cephalosporin use.   . Dilaudid [Hydromorphone Hcl] Nausea And Vomiting  . Ibuprofen Other (See Comments)    wheezing  . Latex Swelling    No reaction with bandaids  . Metoprolol     HEADACHE and Wheezing      Social History   Socioeconomic History  . Marital status: Single    Spouse name: Not on file  . Number of children: 2  . Years of education: 63  . Highest education level: High school graduate  Occupational History  . Not on file  Tobacco Use  . Smoking status: Former Smoker    Packs/day: 0.50    Years: 34.00    Pack years: 17.00    Types: Cigarettes  . Smokeless tobacco: Never Used  Substance and Sexual Activity  . Alcohol use: No    Alcohol/week: 0.0 standard drinks    Comment: seldom  . Drug use: Yes    Types: Marijuana    Comment: 06/19/19  . Sexual activity: Yes    Partners: Male    Birth control/protection: Post-menopausal  Other Topics Concern  . Not on file  Social History Narrative  . Not on file   Social Determinants of Health   Financial Resource Strain: Low Risk   . Difficulty of Paying Living Expenses: Not hard at all  Food Insecurity: No Food Insecurity  . Worried About Charity fundraiser in the Last Year: Never true  . Ran Out of Food in the Last Year: Never true    Transportation Needs: No Transportation Needs  . Lack of Transportation (Medical): No  . Lack of Transportation (Non-Medical): No  Physical Activity: Inactive  . Days of Exercise per Week: 0 days  . Minutes of Exercise per Session: 0 min  Stress: Stress Concern Present  . Feeling of Stress : To some extent  Social Connections:   . Frequency of Communication with Friends and Family:   . Frequency of Social Gatherings with Friends and Family:   . Attends Religious Services:   . Active Member of Clubs or Organizations:   . Attends Archivist Meetings:   Marland Kitchen Marital Status:   Intimate Partner Violence:   . Fear of Current or Ex-Partner:   . Emotionally Abused:   Marland Kitchen Physically Abused:   . Sexually Abused:       Family History  Problem Relation Age of Onset  . Leukemia Mother   . Clotting disorder Father        blood clot  . Hypertension Sister   . Diabetes Sister   . Stroke Sister 9  . Bleeding Disorder Son        ITP "free bleeding disorder"  . Colon cancer Paternal Grandmother   . Diabetes Paternal Grandmother   . Stomach cancer Neg Hx   . Pancreatic cancer Neg Hx     Vitals:   02/19/20 1138  BP: 128/82  Pulse: 84  SpO2: 98%  Weight: 93.4 kg (206 lb)    PHYSICAL EXAM: General:  Well appearing moderately obese African-American female. No respiratory difficulty HEENT: normal Neck: supple. no JVD. Carotids 2+ bilat; no bruits. No lymphadenopathy or thyromegaly appreciated. Cor: PMI nondisplaced. Regular rate & rhythm. No rubs, gallops or murmurs. Lungs: clear Abdomen: Obese soft, nontender, nondistended. No hepatosplenomegaly. No bruits or masses. Good bowel sounds. Extremities: no cyanosis, clubbing, rash, edema Neuro:  alert & oriented x 3, cranial nerves grossly intact. moves all 4 extremities w/o difficulty. Affect pleasant.   ECG: Normal sinus rhythm, 88 bpm.  Nonspecific T wave abnormalities, left atrial enlargement-  personally  reviewed   ASSESSMENT & PLAN:  1. Chronic systolic CHF due to iCM - echo 10/20 EF 25-30% - NYHA II- [name] but limited mostly by osteoarthritis (hip pain) - Appears euvolemic on physical exam - Increase Entresto to 97-103 twice daily - Continue spironolactone 25 mg daily - Continue hydralazine 50 tid - Imdur 30 daily  - Has not been on b-blocker due to wheezing from asthma. Can consider trial of bisoprolol in future - Consider SGLT2i soon - Check BMP today and again in 7 days -Discussed importance of low-sodium diet and daily weights  2. CAD S/P multiple PCIs - s/p PCI in [place] - s/p OM PCI '08-Dr Claiborne Billings - s/p LAD PCI/DES 2012 - s/p pLAD PCI with DES 06/19/2019- Dr Gwenlyn Found after patient turned down for CABG. Known occluded RCA with L-R collaterals, 65% mCFX-EF 25% - Current bilateral shoulder/ arm pain seems most consistent with musculoskeletal etiology.  EKG today shows no acute ST changes.  However, given female, worry about atypical presentation for acute coronary syndrome.  Given her history, will check high-sensitivity troponin to rule out.  If troponin is negative, no further work-up as her pain has been ongoing for 12+ hours. - continue statin and DAPT  3. HTN -Controlled on current regimen, 128/82 -We will plan to further increase Entresto to 97-103 twice daily for systolic heart failure  4. COPD with ongoing tobacco use - continues to smoke but has cut down from 1/2 ppd>> 1/4 PPD  - encouraged to quit.  Previously tried Chantix but discontinued due to side effects/hallucinations  5. Osteoarthritis - mainly shoulder and hip pain - will refer to ortho   6. OSA: - admits to poor compliance w/ CPAP - encouraged to improve compliance   Follow-up with Pharm.D or APP in 2 to 3 weeks for further titration of heart failure regimen  Lyda Jester, PA-C  11:56 AM

## 2020-02-28 ENCOUNTER — Ambulatory Visit: Payer: Medicaid Other | Admitting: Orthopaedic Surgery

## 2020-03-12 ENCOUNTER — Ambulatory Visit: Payer: Medicaid Other | Admitting: Orthopaedic Surgery

## 2020-03-12 ENCOUNTER — Other Ambulatory Visit: Payer: Self-pay

## 2020-03-12 ENCOUNTER — Encounter: Payer: Self-pay | Admitting: Orthopaedic Surgery

## 2020-03-12 DIAGNOSIS — M7542 Impingement syndrome of left shoulder: Secondary | ICD-10-CM

## 2020-03-12 NOTE — Progress Notes (Signed)
Office Visit Note   Patient: Tracey Manning           Date of Birth: 05/14/1961           MRN: [id number] Visit Date: 03/12/2020              Requested by: Consuelo Pandy, PA-C [address] [place],  [place] [address] PCP: Nolene Ebbs, MD   Assessment & Plan: Visit Diagnoses:  1. Impingement syndrome of left shoulder     Plan: Patient states most painful areas her left shoulder we offered her a subacromial injection with cortisone she states she does not like needles and does not want an injection.  She can consider this and return if she like try the injection and see if this gives her good relief.  Review of previous chest x-ray showed minimal degenerative changes in the shoulder.  Reviewed previous CT scan and CT myelo done cervical spine which showed some spurring anteriorly but no central compression.  She did have spinal stenosis at the L2-3 level moderate degree as well as L4-5 which was moderate which she did previously discuss possible surgery with Dr. Christella Noa.   Follow-Up Instructions: No follow-ups on file.   Orders:  No orders of the defined types were placed in this encounter.  No orders of the defined types were placed in this encounter.     Procedures: No procedures performed   Clinical Data: No additional findings.   Subjective: Chief Complaint  Patient presents with  . Lower Back - Pain  . Neck - Pain  . Right Hip - Pain  . Left Hip - Pain    HPI 59 year old female sent to [place] from cardiology clinic for problems with arthritis.  She has heart failure states this is from drug use in her younger days and has 25% ejection fraction.  She states she has both pain in her shoulders may be the worst there is her left shoulder.  Pain in her knees pain in her hips pain in her lower back.  Pain radiates down her legs bothers her in her thighs.  Sometimes she has cramping in her fingers with numbness and tingling in her hands none in her legs.   She relates she has a diagnosis of rheumatoid arthritis but cannot take any anti-inflammatories due to her heart failure and hypertension.  She had seen Dr. Christella Noa who would discuss lumbar surgery but patient states she did not want surgery.  No rheumatoid factor found in chart review.  Review of Systems is system positive for mild elevation of creatinine.  Heart failure low ejection fraction 25%.  Stage [name] kidney disease dyslipidemia, hypertension, obesity with BMI 38 and hypertension.   Objective: Vital Signs: Ht 5\' 1"  (1.549 m)   Wt 202 lb (91.6 kg)   LMP  (LMP Unknown) Comment: verified BEFORE imaging  BMI 38.17 kg/m   Physical Exam Constitutional:      Appearance: She is well-developed.  HENT:     Head: Normocephalic.     Right Ear: External ear normal.     Left Ear: External ear normal.  Eyes:     Pupils: Pupils are equal, round, and reactive to light.  Neck:     Thyroid: No thyromegaly.     Trachea: No tracheal deviation.  Cardiovascular:     Rate and Rhythm: Normal rate.  Pulmonary:     Effort: Pulmonary effort is normal.  Abdominal:     Palpations: Abdomen is soft.  Skin:    General: Skin is warm and dry.  Neurological:     Mental Status: She is alert and oriented to person, place, and time.  Psychiatric:        Behavior: Behavior normal.     Ortho Exam patient is able get from sitting to standing she is amatory heel toe gait.  Negative logroll the hips knees reach full extension.  She complains of pain with internal rotation left shoulder.  She can get her hand to the top of her head.  Long head of the biceps is tender she is tender over the brachial plexus both right and left.  No thenar atrophy.  Positive Neer test negative Hawkins test.  No distal biceps muscle migration.  Good elbow flexion.  Medial lateral epicondyles are normal ulnar nerve is normal to cubital tunnel.  Specialty Comments:  No specialty comments available.  Imaging: No results  found.   PMFS History: Patient Active Problem List   Diagnosis Date Noted  . Impingement syndrome of left shoulder 03/12/2020  . Chronic pain 07/18/2019  . CRI (chronic renal insufficiency), stage 3 (moderate) 07/18/2019  . COPD (chronic obstructive pulmonary disease) ([place]) 06/28/2019  . Obesity (BMI 30-39.9) 06/28/2019  . Ischemic cardiomyopathy 06/28/2019  . Dyspnea on exertion 06/15/2019  . Acute systolic CHF (congestive heart failure) ([place])   . AKI (acute kidney injury) ([place])   . Abnormal uterine bleeding (AUB) 04/03/2016  . Dysfunctional uterine bleeding 11/14/2015  . Bacterial vaginosis (recurrent) 11/14/2015  . Chest pain 11/14/2015  . Essential hypertension 04/04/2014  . Dyslipidemia, goal LDL below 70 04/04/2014  . CAD S/P multiple PCIs 04/04/2014   Past Medical History:  Diagnosis Date  . Anxiety   . Arthritis   . Asthma   . Barrett's esophagus   . Chest pain   . Collagen vascular disease ([place])   . Colon polyps   . Coronary artery disease   . Depression   . Dyslipidemia   . GERD (gastroesophageal reflux disease)    occ  . Hyperlipidemia   . Hypertension   . Myocardial infarct ([place]) [id number]  . Sciatica   . Sleep apnea    no CPAP ordered but using oxygen at bedtime  . Tobacco abuse     Family History  Problem Relation Age of Onset  . Leukemia Mother   . Clotting disorder Father        blood clot  . Hypertension Sister   . Diabetes Sister   . Stroke Sister 39  . Bleeding Disorder Son        ITP "free bleeding disorder"  . Colon cancer Paternal Grandmother   . Diabetes Paternal Grandmother   . Stomach cancer Neg Hx   . Pancreatic cancer Neg Hx     Past Surgical History:  Procedure Laterality Date  . CARDIAC CATHETERIZATION  10/09/2003   [place] - LAD with 30% prox narrowing, 50% stenosis in mid-portion of PLA; RCA with 40% narrowing proximally ([name], [name])  . CARDIAC CATHETERIZATION  10/10/2007   70%  stenosis in first septal perforator branch of LAD, 60-70% narrowing in mid LAD, 20% narrowing in mid AV groove Cfx with 80% diffuse narrowing in small distal marginal, total occlusion of mid RCA with L to R collaterals, 90% stenosis diffusely in prox branch of RCA followed by 70% stenosis in secondary curve & 80% stenosis in small marginal branch ([name])  . CARDIAC CATHETERIZATION  05/28/2008   normal  L main, RCA with 100% prox lesion w/distal filling from LAD collaterals, LAD with 20% prox tubular lesion/ 40% mid LAD lesion/previous stent patent ([name])  . CARDIAC CATHETERIZATION  09/15/2009   discrete 100% osital RCA lesion, 50% prox LAD lesion, non-obstructive disease in all coronaries ([name])  . CESAREAN SECTION    . COLONOSCOPY    . COLONOSCOPY W/ POLYPECTOMY    . CORONARY ANGIOPLASTY WITH STENT PLACEMENT  10/31/2007   PCI of distal Cfx with 2.25x53mm Taxus Adam DES, 60% narrowing of mid LAD ([name])  . CORONARY ANGIOPLASTY WITH STENT PLACEMENT  06/11/2011   PCI of prox-mid LAD with 3x10mm DES Resolute ([name])  . CORONARY STENT INTERVENTION N/A 06/19/2019   Procedure: CORONARY STENT INTERVENTION;  Surgeon: [name], MD;  Location: [facility];  Service: Cardiovascular;  Laterality: N/A;  LAD  . DILATION AND CURETTAGE, DIAGNOSTIC / THERAPEUTIC    . DILITATION & CURRETTAGE/HYSTROSCOPY WITH HYDROTHERMAL ABLATION N/A 04/03/2016   Procedure: DILATATION & CURETTAGE/HYSTEROSCOPY WITH HYDROTHERMAL ABLATION;  Surgeon: [name], MD;  Location: [facility];  Service: Gynecology;  Laterality: N/A;  . NM MYOCAR PERF WALL MOTION  08/2009   persantine myoview - normal perfusion in all regions, perfusion defect in anterior region (breast attenuation), EF 52%, low risk scan  . RIGHT/LEFT HEART CATH AND CORONARY ANGIOGRAPHY N/A 06/15/2019   Procedure: RIGHT/LEFT HEART CATH AND CORONARY ANGIOGRAPHY;  Surgeon: Belva Crome, MD;  Location: [facility];   Service: Cardiovascular;  Laterality: N/A;   Social History   Occupational History  . Not on file  Tobacco Use  . Smoking status: Former Smoker    Packs/day: 0.50    Years: 34.00    Pack years: 17.00    Types: Cigarettes  . Smokeless tobacco: Never Used  Substance and Sexual Activity  . Alcohol use: No    Alcohol/week: 0.0 standard drinks    Comment: seldom  . Drug use: Yes    Types: Marijuana    Comment: 06/19/19  . Sexual activity: Yes    Partners: Male    Birth control/protection: Post-menopausal

## 2020-03-18 ENCOUNTER — Ambulatory Visit (HOSPITAL_COMMUNITY)
Admission: RE | Admit: 2020-03-18 | Discharge: 2020-03-18 | Disposition: A | Discharge status: Home / Self Care | Payer: Medicaid Other | Source: Ambulatory Visit | Attending: Cardiology | Admitting: Cardiology

## 2020-03-18 ENCOUNTER — Other Ambulatory Visit: Payer: Self-pay

## 2020-03-18 ENCOUNTER — Encounter (HOSPITAL_COMMUNITY): Payer: Self-pay

## 2020-03-18 VITALS — BP 128/86 | HR 102 | Wt 207.0 lb

## 2020-03-18 DIAGNOSIS — I110 Hypertensive heart disease with heart failure: Secondary | ICD-10-CM | POA: Diagnosis not present

## 2020-03-18 DIAGNOSIS — Z6839 Body mass index (BMI) 39.0-39.9, adult: Secondary | ICD-10-CM | POA: Diagnosis not present

## 2020-03-18 DIAGNOSIS — I2510 Atherosclerotic heart disease of native coronary artery without angina pectoris: Secondary | ICD-10-CM | POA: Insufficient documentation

## 2020-03-18 DIAGNOSIS — I252 Old myocardial infarction: Secondary | ICD-10-CM | POA: Diagnosis not present

## 2020-03-18 DIAGNOSIS — Z833 Family history of diabetes mellitus: Secondary | ICD-10-CM | POA: Diagnosis not present

## 2020-03-18 DIAGNOSIS — Z7951 Long term (current) use of inhaled steroids: Secondary | ICD-10-CM | POA: Insufficient documentation

## 2020-03-18 DIAGNOSIS — Z880 Allergy status to penicillin: Secondary | ICD-10-CM | POA: Insufficient documentation

## 2020-03-18 DIAGNOSIS — I255 Ischemic cardiomyopathy: Secondary | ICD-10-CM | POA: Diagnosis not present

## 2020-03-18 DIAGNOSIS — Z823 Family history of stroke: Secondary | ICD-10-CM | POA: Diagnosis not present

## 2020-03-18 DIAGNOSIS — Z885 Allergy status to narcotic agent status: Secondary | ICD-10-CM | POA: Diagnosis not present

## 2020-03-18 DIAGNOSIS — G4733 Obstructive sleep apnea (adult) (pediatric): Secondary | ICD-10-CM | POA: Diagnosis not present

## 2020-03-18 DIAGNOSIS — Z886 Allergy status to analgesic agent status: Secondary | ICD-10-CM | POA: Insufficient documentation

## 2020-03-18 DIAGNOSIS — J449 Chronic obstructive pulmonary disease, unspecified: Secondary | ICD-10-CM | POA: Diagnosis not present

## 2020-03-18 DIAGNOSIS — Z8249 Family history of ischemic heart disease and other diseases of the circulatory system: Secondary | ICD-10-CM | POA: Insufficient documentation

## 2020-03-18 DIAGNOSIS — K219 Gastro-esophageal reflux disease without esophagitis: Secondary | ICD-10-CM | POA: Insufficient documentation

## 2020-03-18 DIAGNOSIS — Z7982 Long term (current) use of aspirin: Secondary | ICD-10-CM | POA: Diagnosis not present

## 2020-03-18 DIAGNOSIS — M1990 Unspecified osteoarthritis, unspecified site: Secondary | ICD-10-CM | POA: Insufficient documentation

## 2020-03-18 DIAGNOSIS — Z806 Family history of leukemia: Secondary | ICD-10-CM | POA: Diagnosis not present

## 2020-03-18 DIAGNOSIS — I5022 Chronic systolic (congestive) heart failure: Secondary | ICD-10-CM | POA: Diagnosis present

## 2020-03-18 DIAGNOSIS — E785 Hyperlipidemia, unspecified: Secondary | ICD-10-CM | POA: Diagnosis not present

## 2020-03-18 DIAGNOSIS — Z86010 Personal history of colonic polyps: Secondary | ICD-10-CM | POA: Insufficient documentation

## 2020-03-18 DIAGNOSIS — Z955 Presence of coronary angioplasty implant and graft: Secondary | ICD-10-CM | POA: Insufficient documentation

## 2020-03-18 DIAGNOSIS — Z87891 Personal history of nicotine dependence: Secondary | ICD-10-CM | POA: Diagnosis not present

## 2020-03-18 DIAGNOSIS — Z888 Allergy status to other drugs, medicaments and biological substances status: Secondary | ICD-10-CM | POA: Insufficient documentation

## 2020-03-18 DIAGNOSIS — Z79899 Other long term (current) drug therapy: Secondary | ICD-10-CM | POA: Insufficient documentation

## 2020-03-18 DIAGNOSIS — Z91040 Latex allergy status: Secondary | ICD-10-CM | POA: Insufficient documentation

## 2020-03-18 LAB — BASIC METABOLIC PANEL
Anion gap: 11 (ref 5–15)
BUN: 16 mg/dL (ref 6–20)
CO2: 25 mmol/L (ref 22–32)
Calcium: 9.9 mg/dL (ref 8.9–10.3)
Chloride: 102 mmol/L (ref 98–111)
Creatinine, Ser: 1.2 mg/dL — ABNORMAL HIGH (ref 0.44–1.00)
GFR calc Af Amer: 58 mL/min — ABNORMAL LOW (ref >60)
GFR calc non Af Amer: 50 mL/min — ABNORMAL LOW (ref >60)
Glucose, Bld: 110 mg/dL — ABNORMAL HIGH (ref 70–99)
Potassium: 3.7 mmol/L (ref 3.5–5.1)
Sodium: 138 mmol/L (ref 135–145)

## 2020-03-18 MED ORDER — DIGOXIN 125 MCG PO TABS
0.1250 mg | ORAL_TABLET | Freq: Every day | ORAL | 6 refills | Status: DC
Start: 2020-03-18 — End: 2020-10-21

## 2020-03-18 NOTE — Progress Notes (Signed)
CSW consulted to speak with pt regarding possible in home aid assistance.  CSW met with pt who endorses that she could use some assistance at home particularly with bathing and running errands.  States that her back pain makes it hard for her to do anything requiring bending over.  Pt thinks that the process had been started to get her aid services before but was never finalized- states this was 2-3 years ago.  Pt agreeable to CSW applying for PCS services through pt Medicaid- referral completed and faxed to [facility] for review.  CSW will continue to follow and assist as needed  Jorge Ny, [facility] Desk#: [phone] Cell#: [phone]

## 2020-03-18 NOTE — Progress Notes (Signed)
ADVANCED HF CLINIC PROGRESS NOTE  Primary Care: Dr. Tacy Dura  Primary Cardiologist: Dr. Gwenlyn Found [facility]: Dr. 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 Gwenlyn Found to [facility] for further HF evaluation.   She had a remote PCI in 2004 in [place]. In 2008 she had a circumflex PCI by Dr. Claiborne Billings. She had known occluded RCA with left-to-right collaterals. In 2012 she had an LAD stent placedby Dr. Claiborne Billings.  She was admitted 06/11/2019 with acute respiratory failure and unstable angina. She had new LV dysfunction with an EF of 25%. She had acute kidney injury as well with a creatinine of 1.8. the patient was admitted, diuresed, and underwent diagnostic catheterization 06/15/2019. This revealed a 60 to 70% mid circumflex stenosis, known occluded RCA with left-to-right collaterals, and a 95% proximal LAD before the previously placed stent which was 50 to 60% narrowed. After review the patient was turned down for CABG and she underwent intervention to her LAD on 06/19/2019 by Dr. Gwenlyn Found.  She had a f/u echo which revealed her EF to be unchanged at 25-30%.  Had initial [facility] consultation w/ Dr. Haroldine Laws 01/08/20. Meds were adjustment. Losartan stopped and Entresto added, 49-51bid. Spironolactone was increased to 25 mg daily. Hydralazine and Imdur continued. It was noted that she had not been on a  blocker due to wheezing from asthma.   At her last f/u 3/21, she was doing well form CHF standpoint. Entresto further titrated to 97-103 bid. She has tolerated this well w/o side effects.   Presents back for repeat assessment. No resting dyspnea but gets SOB w/ mild-moderate activities. Volume status and wt stable. No LEE, orthopnea or PND. No chest pain. BP stable but HR elevated in the low 100s. We discussed addition of   blocker (bisoprolol) but she adamantly refuses to try another  blocker as she had bad HA/migraines w/ metoprolol.     Review of Systems: [y] = yes, '[ ]'$  = no   General: Weight gain '[ ]'$ ; Weight loss '[ ]'$ ; Anorexia '[ ]'$ ; Fatigue Blue.Reese ]; Fever '[ ]'$ ; Chills '[ ]'$ ; Weakness [ y]  Cardiac: Chest pain/pressure Blue.Reese ]; Resting SOB '[ ]'$ ; Exertional SOB Blue.Reese ]; Orthopnea [ y]; Pedal Edema Blue.Reese ]; Palpitations '[ ]'$ ; Syncope '[ ]'$ ; Presyncope '[ ]'$ ; Paroxysmal nocturnal dyspnea[y ]  Pulmonary: Cough '[ ]'$ ; Pryor Montes ]; Hemoptysis'[ ]'$ ; Sputum '[ ]'$ ; Snoring '[ ]'$   GI: Vomiting'[ ]'$ ; Dysphagia'[ ]'$ ; Melena'[ ]'$ ; Hematochezia '[ ]'$ ; Heartburn'[ ]'$ ; Abdominal pain '[ ]'$ ; Constipation '[ ]'$ ; Diarrhea '[ ]'$ ; BRBPR '[ ]'$   GU: Hematuria'[ ]'$ ; Dysuria '[ ]'$ ; Nocturia'[ ]'$   Vascular: Pain in legs with walking Blue.Reese ]; Pain in feet with lying flat '[ ]'$ ; Non-healing sores '[ ]'$ ; Stroke '[ ]'$ ; TIA '[ ]'$ ; Slurred speech '[ ]'$ ;  Neuro: Headaches[y ]; Vertigo'[ ]'$ ; Seizures'[ ]'$ ; Paresthesias'[ ]'$ ;Blurred vision '[ ]'$ ; Diplopia '[ ]'$ ; Vision changes '[ ]'$   Ortho/Skin: Arthritis Blue.Reese ]; Joint pain Blue.Reese ]; Muscle pain Blue.Reese ]; Joint swelling Blue.Reese ]; Back Pain [ y]; Rash '[ ]'$   Psych: Depression[y ]; Anxiety'[ ]'$   Heme: Bleeding problems '[ ]'$ ; Clotting disorders '[ ]'$ ; Anemia '[ ]'$   Endocrine: Diabetes Blue.Reese ]; Thyroid dysfunction'[ ]'$    Past Medical History:  Diagnosis Date  . Anxiety   . Arthritis   . Asthma   .  Barrett's esophagus   . Chest pain   . Collagen vascular disease ([place])   . Colon polyps   . Coronary artery disease   . Depression   . Dyslipidemia   . GERD (gastroesophageal reflux disease)    occ  . Hyperlipidemia   . Hypertension   . Myocardial infarct ([place]) 5859,2924  . Sciatica   . Sleep apnea    no CPAP ordered but using oxygen at bedtime  . Tobacco abuse     Current Outpatient Medications  Medication Sig Dispense Refill  . albuterol (PROVENTIL HFA;VENTOLIN HFA) 108 (90 BASE) MCG/ACT inhaler Inhale 2 puffs into the lungs every 6 (six) hours as needed for wheezing or shortness of  breath.    Marland Kitchen albuterol (PROVENTIL) (2.5 MG/3ML) 0.083% nebulizer solution Take 6 mLs by nebulization every 6 (six) hours as needed for wheezing or shortness of breath.    Marland Kitchen aspirin EC 81 MG EC tablet Take 1 tablet (81 mg total) by mouth daily. 60 tablet 1  . atorvastatin (LIPITOR) 80 MG tablet Take 1 tablet (80 mg total) by mouth daily. 60 tablet 0  . clopidogrel (PLAVIX) 75 MG tablet Take 1 tablet (75 mg total) by mouth daily with breakfast. 60 tablet 1  . fluticasone-salmeterol (ADVAIR HFA) 115-21 MCG/ACT inhaler Inhale 2 puffs into the lungs 2 (two) times daily. 1 Inhaler 12  . furosemide (LASIX) 40 MG tablet Take 1 tablet (40 mg total) by mouth daily. 60 tablet 0  . hydrALAZINE (APRESOLINE) 50 MG tablet Take 1 tablet (50 mg total) by mouth every 8 (eight) hours. 90 tablet 1  . isosorbide mononitrate (IMDUR) 30 MG 24 hr tablet Take 1 tablet (30 mg total) by mouth daily. 30 tablet 1  . NARCAN 4 MG/0.1ML LIQD nasal spray kit Place 1 spray into the nose once.     . nitroGLYCERIN (NITROSTAT) 0.4 MG SL tablet Place 1 tablet (0.4 mg total) under the tongue every 5 (five) minutes as needed for chest pain. 25 tablet 6  . potassium chloride (KLOR-CON) 10 MEQ tablet Take 2 tablets (20 mEq total) by mouth daily. 60 tablet 3  . sacubitril-valsartan (ENTRESTO) 97-103 MG Take 1 tablet by mouth 2 (two) times daily. 60 tablet 3  . spironolactone (ALDACTONE) 25 MG tablet Take 1 tablet (25 mg total) by mouth daily. 30 tablet 3  . VOLTAREN 1 % GEL SMARTSIG:4 Gram(s) Topical 4 Times Daily PRN    . zolpidem (AMBIEN) 10 MG tablet Take 10 mg by mouth at bedtime as needed for sleep.    Marland Kitchen digoxin (LANOXIN) 0.125 MG tablet Take 1 tablet (0.125 mg total) by mouth daily. 30 tablet 6   No current facility-administered medications for this encounter.    Allergies  Allergen Reactions  . Coreg [Carvedilol] Other (See Comments)    Headache  . Other Anaphylaxis and Hives    Fresh strawberries (throat swelled)  .  Amoxicillin Nausea And Vomiting    Did it involve swelling of the face/tongue/throat, SOB, or low BP? Yes Did it involve sudden or severe rash/hives, skin peeling, or any reaction on the inside of your mouth or nose? Yes Did you need to seek medical attention at a hospital or doctor's office? Yes When did it last happen?within last 10 years If all above answers are "NO", may proceed with cephalosporin use.   . Dilaudid [Hydromorphone Hcl] Nausea And Vomiting  . Ibuprofen Other (See Comments)    wheezing  . Latex Swelling    No reaction  with bandaids  . Metoprolol     HEADACHE and Wheezing      Social History   Socioeconomic History  . Marital status: Single    Spouse name: Not on file  . Number of children: 2  . Years of education: 39  . Highest education level: High school graduate  Occupational History  . Not on file  Tobacco Use  . Smoking status: Former Smoker    Packs/day: 0.50    Years: 34.00    Pack years: 17.00    Types: Cigarettes  . Smokeless tobacco: Never Used  Substance and Sexual Activity  . Alcohol use: No    Alcohol/week: 0.0 standard drinks    Comment: seldom  . Drug use: Yes    Types: Marijuana    Comment: 06/19/19  . Sexual activity: Yes    Partners: Male    Birth control/protection: Post-menopausal  Other Topics Concern  . Not on file  Social History Narrative  . Not on file   Social Determinants of Health   Financial Resource Strain: Low Risk   . Difficulty of Paying Living Expenses: Not hard at all  Food Insecurity: No Food Insecurity  . Worried About Charity fundraiser in the Last Year: Never true  . Ran Out of Food in the Last Year: Never true  Transportation Needs: No Transportation Needs  . Lack of Transportation (Medical): No  . Lack of Transportation (Non-Medical): No  Physical Activity: Inactive  . Days of Exercise per Week: 0 days  . Minutes of Exercise per Session: 0 min  Stress: Stress Concern Present  . Feeling of  Stress : To some extent  Social Connections:   . Frequency of Communication with Friends and Family:   . Frequency of Social Gatherings with Friends and Family:   . Attends Religious Services:   . Active Member of Clubs or Organizations:   . Attends Archivist Meetings:   Marland Kitchen Marital Status:   Intimate Partner Violence:   . Fear of Current or Ex-Partner:   . Emotionally Abused:   Marland Kitchen Physically Abused:   . Sexually Abused:       Family History  Problem Relation Age of Onset  . Leukemia Mother   . Clotting disorder Father        blood clot  . Hypertension Sister   . Diabetes Sister   . Stroke Sister 51  . Bleeding Disorder Son        ITP "free bleeding disorder"  . Colon cancer Paternal Grandmother   . Diabetes Paternal Grandmother   . Stomach cancer Neg Hx   . Pancreatic cancer Neg Hx     Vitals:   03/18/20 1053  BP: 128/86  Pulse: (!) 102  SpO2: 97%  Weight: 93.9 kg (207 lb)    PHYSICAL EXAM: General:  Well appearing. No respiratory difficulty HEENT: normal Neck: supple. no JVD. Carotids 2+ bilat; no bruits. No lymphadenopathy or thyromegaly appreciated. Cor: PMI nondisplaced. Regular rate & rhythm. No rubs, gallops or murmurs. Lungs: clear Abdomen: soft, nontender, nondistended. No hepatosplenomegaly. No bruits or masses. Good bowel sounds. Extremities: no cyanosis, clubbing, rash, edema Neuro: alert & oriented x 3, cranial nerves grossly intact. moves all 4 extremities w/o difficulty. Affect pleasant.   ECG: not performed    ASSESSMENT & PLAN:  1. Chronic systolic CHF due to iCM - echo 10/20 EF 25-30% - NYHA II- [name], stable  - Euvolemic on physical exam. Daily wts have been stable.  -  Continue Entresto to 97-103 twice daily - Continue spironolactone 25 mg daily - Continue hydralazine 50 tid - Continue Imdur 30 daily  - Has not been on b-blocker due to wheezing from asthma. I recommended trial of bisoprolol given elevated HR, but she adamantly  refuses to try another  blocker. Had bad HAs w. Metoprolol.  - Will start digoxin 0.125. check dig level in 7-10 days - May need future addition of corlanor if unable to achieve HR < 70 bpm - Check BMP today  - F/u w/ pharm D in 2 weeks for further med titration. We are nearing med optimization. She has been resistant to addition of new meds. Feels that she is on "too much" currently. Was challenging convincing her to start digoxin today. Will try to add a SGLT2i at pharmD visit, if she agrees. Will plan repeat echo in 3 months and return f/u w/ Dr. Haroldine Laws. If EF remains < 35%, will refer to EP for potential ICD.    -Discussed importance of low-sodium diet and daily weights  2. CAD S/P multiple PCIs - s/p PCI in [place] - s/p OM PCI '08-Dr Claiborne Billings - s/p LAD PCI/DES 2012 - s/p pLAD PCI with DES 06/19/2019- Dr Gwenlyn Found after patient turned down for CABG. Known occluded RCA with L-R collaterals, 65% mCFX-EF 25% - no ischemic CP. Continue medical therapy    3. HTN -Controlled on current regimen, 128/86 -Continue current regimen per above   4. COPD with ongoing tobacco use - continues to smoke but has cut down from 1/2 ppd>> 1/4 PPD  - encouraged to quit.  Previously tried Chantix but discontinued due to side effects/hallucinations   5. OSA: - admits to poor compliance w/ CPAP - encouraged to improve compliance   Follow-up with Pharm.D or APP in 2 to 3 weeks for further titration of heart failure regimen  9:07 PM

## 2020-03-18 NOTE — Patient Instructions (Signed)
Start Digoxin 0.125 mg daily  Labs done today, we will contact you for abnormal results  Please follow up with our heart failure pharmacist in 2 weeks  Your physician recommends that you schedule a follow-up appointment in: 3 months with echocardiogram  If you have any questions or concerns before your next appointment please send [place] a message through [place] or call our office at [phone].  At the [facility], you and your health needs are our priority. As part of our continuing mission to provide you with exceptional heart care, we have created designated Provider Care Teams. These Care Teams include your primary Cardiologist (physician) and Advanced Practice Providers (APPs- Physician Assistants and Nurse Practitioners) who all work together to provide you with the care you need, when you need it.   You may see any of the following providers on your designated Care Team at your next follow up: Marland Kitchen Dr Glori Bickers . Dr Loralie Champagne . [name], NP . Lyda Jester, PA . Audry Riles, PharmD   Please be sure to bring in all your medications bottles to every appointment.

## 2020-03-26 ENCOUNTER — Telehealth (HOSPITAL_COMMUNITY): Payer: Self-pay

## 2020-03-26 NOTE — Telephone Encounter (Signed)
When patient was in office on 03/18/2020 she gave me a form to fax for her to [facility] transportation for [facility]. Original fax didn't go through faxed again today and received conformation that the fax went through. Was faxed to [phone]. Form will be scanned into patients chart

## 2020-04-01 NOTE — Progress Notes (Signed)
Primary Care: Dr. Tacy Dura  Primary Cardiologist: Dr. Gwenlyn Found [facility]: Dr. 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 Gwenlyn Found to [facility] for further HF evaluation.   She had a remote PCI in 2004 in [place]. In 2008 she had a circumflex PCI by Dr. Claiborne Billings. She had known occluded RCA with left-to-right collaterals. In 2012 she had an LAD stent placedby Dr. Claiborne Billings.  She was admitted 06/11/2019 with acute respiratory failure and unstable angina. She had new LV dysfunction with an EF of 25%. She had acute kidney injury as well with a creatinine of 1.8. the patient was admitted, diuresed, and underwent diagnostic catheterization 06/15/2019. This revealed a 60 to 70% mid circumflex stenosis, known occluded RCA with left-to-right collaterals, and a 95% proximal LAD before the previously placed stent which was 50 to 60% narrowed. After review the patient was turned down for CABGand she underwent intervention to her LAD on 06/19/2019 by Dr. Gwenlyn Found.  She had a f/u echo which revealed her EF to be unchanged at 25-30%. Had initial [facility] consultation w/ Dr. Haroldine Laws 01/08/20. Medications were adjusted. Losartan was stopped and Entresto 49/51 mg BID was initiated.  Spironolactone was increased to 25 mg daily. Hydralazine and Imdur continued. It was noted that she had not been on a ? blocker due to wheezing from asthma.   At her last f/u 01/2020, she was doing well from a CHF standpoint. Entresto further titrated to 97-103 mg BID. She had tolerated this well without side effects.   Recently returned to HF Clinic on 03/18/20 with Lyda Jester, PA-C. Presented back for repeat assessment. No resting dyspnea but reported getting SOB with mild-moderate activities. Volume status and weight were stable. No LEE, orthopnea or PND. No  chest pain. BP stable but HR elevated in the low 100s. We discussed addition of ? blocker (bisoprolol) but she adamantly refuses to try another ? blocker as she had bad HA/migraines with metoprolol.   Today she returns to HF clinic for pharmacist medication titration. At last visit with PA-C, digoxin 0.125 mg daily was initiated as patient refused to try a beta-blocker. Unfortunately, patient never started the digoxin because she wants to stop smoking before she makes any more medication changes. She is in process of quitting smoking and has plans to start the digoxin next week. Overall she is feeling well but was tearful today in clinic. Upset because she feels like she caused her heart problems and also "feels like a [place] pig" with all these medications. No dizziness, lightheadedness, chest pain or palpitations. Her breathing has been better but notes she still gets SOB with moderate activity. Her weights have been stable at 201-204 lbs at home. She takes furosemide 40 mg daily and has not needed any extra. No LEE, PND or orthopnea. Appetite is good. Has cut out red meat.    HF Medications: Entresto 97/103 mg BID Spironolactone 25 mg daily Hydralazine 50 mg TID Isosorbide mononitrate (Imdur) 30 mg daily Digoxin 0.125 mg daily - has not started taking yet Furosemide 40 mg daily Potassium chloride 20 mEq daily  Has the patient been experiencing any side effects to the medications prescribed?  No  Does the patient have any problems obtaining medications due to transportation or finances?   No - has [place] Medicaid  Understanding of regimen: good Understanding  of indications: good Potential of compliance: fair Patient understands to avoid NSAIDs. Patient understands to avoid decongestants.    Pertinent Lab Values (03/18/20): Marland Kitchen Serum creatinine 1.20, BUN 16, Potassium 3.7, Sodium 138  Vital Signs: . Weight: 207.6 lbs(last clinic weight: 207 lbs) . Blood pressure: 138/90  . Heart rate: 81    Assessment: 1. Chronicsystolic CHF due to iCM - echo 10/20 EF 25-30% - NYHA [name], stable  - Euvolemic on physical exam. Daily weights have been stable.  - Has not been on b-blocker due to wheezing from asthma. Refuses to re-try beta-blocker - had bad headaches with Metoprolol. - Continue Entresto 97-103 mg twice daily - Continue spironolactone 25 mg daily - Continue hydralazine 50 mg TID - Continue Imdur 30 daily   - Noted she had not started digoxin yet. Per patient, she plans to start next week after quitting smoking. Will have her start digoxin 0.125 mg daily next week. Check dig level at next clinic visit.  - May need future addition of corlanor if unable to achieve HR < 70 bpm -  We are nearing med optimization. She has been resistant to addition of new meds. Feels that she is on "too much" currently. Would be good candidate for SGLT2i if able to convince her to start in future.  Will plan repeat echo 05/2020 and return f/u w/ Dr. Haroldine Laws. If EF remains < 35%, will refer to EP for potential ICD.    -Discussed importance of low-sodium diet and daily weights  2. CAD S/P multiple PCIs - s/p PCI in [place] - s/p OM PCI '08-Dr Claiborne Billings - s/p LAD PCI/DES 2012 - s/p pLAD PCI with DES 06/19/2019- Dr Gwenlyn Found after patient turned down for CABG. Known occluded RCA with L-R collaterals, 65% mCFX-EF 25% - no ischemic CP. Continue medical therapy   3. HTN -Controlled on current regimen -Continue current regimen per above   4. COPD with ongoing tobacco use - Had cut down from 1/2 ppd>> 1/4 PPD. Now notes she has not had a cigarette in 5 days.  - encouraged to quit.  Previously tried Chantix but discontinued due to side effects/hallucinations   5. OSA: - admits to poor compliance w/ CPAP - encouraged to improve compliance    Plan: 1) Medication changes: Based on clinical presentation, vital signs and recent labs will have her start digoxin next week. Does not wish to start  until she quits smoking.  2) Follow-up: 4 weeks with pharmacy clinic.    Audry Riles, PharmD, BCPS, BCCP, CPP Heart Failure Clinic Pharmacist [phone]

## 2020-04-04 ENCOUNTER — Telehealth (HOSPITAL_COMMUNITY): Payer: Self-pay | Admitting: Licensed Clinical Social Worker

## 2020-04-04 NOTE — Telephone Encounter (Signed)
CSW called pt to check in regarding PCS referral that we placed.  Pt reports that she has been approved to get aid assistance through [facility] but it likely won't start until June or July.  Pt reports she is doing and had CSW confirm her appt details for next week with our pharmacist.  CSW will continue to follow and assist as needed  Jorge Ny, [facility] Desk#: [phone] Cell#: [phone]

## 2020-04-09 ENCOUNTER — Ambulatory Visit (HOSPITAL_COMMUNITY)
Admission: RE | Admit: 2020-04-09 | Discharge: 2020-04-09 | Disposition: A | Discharge status: Home / Self Care | Payer: Medicaid Other | Source: Ambulatory Visit | Attending: Internal Medicine | Admitting: Internal Medicine

## 2020-04-09 ENCOUNTER — Other Ambulatory Visit: Payer: Self-pay

## 2020-04-09 VITALS — BP 138/90 | HR 81 | Wt 207.6 lb

## 2020-04-09 DIAGNOSIS — F17200 Nicotine dependence, unspecified, uncomplicated: Secondary | ICD-10-CM | POA: Insufficient documentation

## 2020-04-09 DIAGNOSIS — G4733 Obstructive sleep apnea (adult) (pediatric): Secondary | ICD-10-CM | POA: Insufficient documentation

## 2020-04-09 DIAGNOSIS — I2510 Atherosclerotic heart disease of native coronary artery without angina pectoris: Secondary | ICD-10-CM | POA: Diagnosis not present

## 2020-04-09 DIAGNOSIS — I5020 Unspecified systolic (congestive) heart failure: Secondary | ICD-10-CM | POA: Diagnosis present

## 2020-04-09 DIAGNOSIS — Z79899 Other long term (current) drug therapy: Secondary | ICD-10-CM | POA: Diagnosis not present

## 2020-04-09 DIAGNOSIS — I5022 Chronic systolic (congestive) heart failure: Secondary | ICD-10-CM | POA: Diagnosis not present

## 2020-04-09 DIAGNOSIS — R7303 Prediabetes: Secondary | ICD-10-CM | POA: Diagnosis not present

## 2020-04-09 DIAGNOSIS — E785 Hyperlipidemia, unspecified: Secondary | ICD-10-CM | POA: Diagnosis not present

## 2020-04-09 DIAGNOSIS — Z955 Presence of coronary angioplasty implant and graft: Secondary | ICD-10-CM | POA: Diagnosis not present

## 2020-04-09 DIAGNOSIS — I110 Hypertensive heart disease with heart failure: Secondary | ICD-10-CM | POA: Insufficient documentation

## 2020-04-09 DIAGNOSIS — I255 Ischemic cardiomyopathy: Secondary | ICD-10-CM | POA: Insufficient documentation

## 2020-04-09 DIAGNOSIS — J449 Chronic obstructive pulmonary disease, unspecified: Secondary | ICD-10-CM | POA: Diagnosis not present

## 2020-04-09 NOTE — Patient Instructions (Signed)
It was a pleasure seeing you today!  MEDICATIONS: -No medication changes today -Call if you have questions about your medications.  NEXT APPOINTMENT: Return to clinic in 4 weeks with Pharmacy Clinic.  In general, to take care of your heart failure: -Limit your fluid intake to 2 Liters (half-gallon) per day.   -Limit your salt intake to ideally 2-3 grams (2000-3000 mg) per day. -Weigh yourself daily and record, and bring that "weight diary" to your next appointment.  (Weight gain of 2-3 pounds in 1 day typically means fluid weight.) -The medications for your heart are to help your heart and help you live longer.   -Please contact us before stopping any of your heart medications.  Call the clinic at [phone] with questions or to reschedule future appointments.

## 2020-05-15 ENCOUNTER — Other Ambulatory Visit: Payer: Self-pay

## 2020-05-15 ENCOUNTER — Other Ambulatory Visit (HOSPITAL_COMMUNITY): Payer: Self-pay | Admitting: Internal Medicine

## 2020-05-15 ENCOUNTER — Ambulatory Visit (HOSPITAL_COMMUNITY)
Admission: RE | Admit: 2020-05-15 | Discharge: 2020-05-15 | Disposition: A | Discharge status: Home / Self Care | Payer: Medicaid Other | Source: Ambulatory Visit | Attending: Internal Medicine | Admitting: Internal Medicine

## 2020-05-15 ENCOUNTER — Telehealth (HOSPITAL_COMMUNITY): Payer: Self-pay | Admitting: Pharmacy Technician

## 2020-05-15 ENCOUNTER — Telehealth (HOSPITAL_COMMUNITY): Payer: Self-pay | Admitting: Pharmacist

## 2020-05-15 VITALS — BP 110/74 | HR 75 | Wt 207.4 lb

## 2020-05-15 DIAGNOSIS — I5022 Chronic systolic (congestive) heart failure: Secondary | ICD-10-CM | POA: Diagnosis not present

## 2020-05-15 DIAGNOSIS — F17210 Nicotine dependence, cigarettes, uncomplicated: Secondary | ICD-10-CM | POA: Insufficient documentation

## 2020-05-15 DIAGNOSIS — J449 Chronic obstructive pulmonary disease, unspecified: Secondary | ICD-10-CM | POA: Diagnosis not present

## 2020-05-15 DIAGNOSIS — Z955 Presence of coronary angioplasty implant and graft: Secondary | ICD-10-CM | POA: Insufficient documentation

## 2020-05-15 DIAGNOSIS — Z5181 Encounter for therapeutic drug level monitoring: Secondary | ICD-10-CM | POA: Diagnosis present

## 2020-05-15 DIAGNOSIS — G4733 Obstructive sleep apnea (adult) (pediatric): Secondary | ICD-10-CM | POA: Diagnosis not present

## 2020-05-15 DIAGNOSIS — I110 Hypertensive heart disease with heart failure: Secondary | ICD-10-CM | POA: Insufficient documentation

## 2020-05-15 DIAGNOSIS — I2510 Atherosclerotic heart disease of native coronary artery without angina pectoris: Secondary | ICD-10-CM | POA: Insufficient documentation

## 2020-05-15 DIAGNOSIS — G43909 Migraine, unspecified, not intractable, without status migrainosus: Secondary | ICD-10-CM | POA: Diagnosis not present

## 2020-05-15 DIAGNOSIS — I255 Ischemic cardiomyopathy: Secondary | ICD-10-CM | POA: Insufficient documentation

## 2020-05-15 DIAGNOSIS — Z7984 Long term (current) use of oral hypoglycemic drugs: Secondary | ICD-10-CM | POA: Insufficient documentation

## 2020-05-15 DIAGNOSIS — I5021 Acute systolic (congestive) heart failure: Secondary | ICD-10-CM

## 2020-05-15 DIAGNOSIS — Z596 Low income: Secondary | ICD-10-CM | POA: Insufficient documentation

## 2020-05-15 DIAGNOSIS — Z87448 Personal history of other diseases of urinary system: Secondary | ICD-10-CM | POA: Insufficient documentation

## 2020-05-15 DIAGNOSIS — E785 Hyperlipidemia, unspecified: Secondary | ICD-10-CM | POA: Insufficient documentation

## 2020-05-15 DIAGNOSIS — E118 Type 2 diabetes mellitus with unspecified complications: Secondary | ICD-10-CM | POA: Insufficient documentation

## 2020-05-15 DIAGNOSIS — Z79899 Other long term (current) drug therapy: Secondary | ICD-10-CM | POA: Diagnosis not present

## 2020-05-15 LAB — BASIC METABOLIC PANEL
Anion gap: 12 (ref 5–15)
BUN: 15 mg/dL (ref 6–20)
CO2: 24 mmol/L (ref 22–32)
Calcium: 9.6 mg/dL (ref 8.9–10.3)
Chloride: 101 mmol/L (ref 98–111)
Creatinine, Ser: 1.2 mg/dL — ABNORMAL HIGH (ref 0.44–1.00)
GFR calc Af Amer: 58 mL/min — ABNORMAL LOW (ref >60)
GFR calc non Af Amer: 50 mL/min — ABNORMAL LOW (ref >60)
Glucose, Bld: 100 mg/dL — ABNORMAL HIGH (ref 70–99)
Potassium: 3.7 mmol/L (ref 3.5–5.1)
Sodium: 137 mmol/L (ref 135–145)

## 2020-05-15 MED ORDER — DAPAGLIFLOZIN PROPANEDIOL 10 MG PO TABS
10.0000 mg | ORAL_TABLET | Freq: Every day | ORAL | 11 refills | Status: DC
Start: 2020-05-15 — End: 2020-10-21

## 2020-05-15 MED FILL — FARXIGA 10 MG TABLET: 10 | 30 days supply | Qty: 30 | Fill #0

## 2020-05-15 NOTE — Telephone Encounter (Signed)
Saw patient in clinic today. She is having a hard time getting her medications from [place] at times. We are going to switch her medications from [facility] [address] to [facility]. She will be able to get her prescriptions mailed to her from this pharmacy.  Called and [facility] will have her transfer sent to [facility] by 2. Let Aaron Edelman [facility] know, he will be on the lookout for her. I will look over her list after it comes in to ensure all medications were transferred successfully.  Went ahead and used a 30 day free card for [facility] to get her started while we await the results of her prior authorization through [place]. She is aware it may take a few days to come in the mail.  Will follow up.

## 2020-05-15 NOTE — Progress Notes (Signed)
Primary Care: Dr. Tacy Dura  Primary Cardiologist: Dr. Gwenlyn Found [facility]: Dr. 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 Gwenlyn Found to [facility] for further HF evaluation.    She had a remote PCI in 2004 in [place].  In 2008 she had a circumflex PCI by Dr. Claiborne Billings.  She had known occluded RCA with left-to-right collaterals.  In 2012 she had an LAD stent placed by Dr. Claiborne Billings.   She was admitted 06/11/2019 with acute respiratory failure and unstable angina.  She had new LV dysfunction with an EF of 25%.  She had acute kidney injury as well with a creatinine of 1.8.  the patient was admitted, diuresed, and underwent diagnostic catheterization 06/15/2019.  This revealed a 60 to 70% mid circumflex stenosis, known occluded RCA with left-to-right collaterals, and a 95% proximal LAD before the previously placed stent which was 50 to 60% narrowed.  After reviewing, the patient was turned down for CABG and she underwent intervention to her LAD on 06/19/2019 by Dr. Gwenlyn Found.    She had a f/u ECHO which revealed her EF to be unchanged at 25-30%. Had initial [facility] consultation with Dr. Haroldine Laws 01/08/20. Medications were adjusted. Losartan was stopped and Entresto was initiated.  Spironolactone was increased to 25 mg daily. Hydralazine and Imdur continued. It was noted that she had not been on a  blocker due to wheezing from asthma.    Recently returned to pharmacy HF Clinic on 04/09/20 with HF pharmacy clinic. Overall, she was feeling well but was tearful. Patient was upset because she felt guilty about causing her heart problems and also "felt like a [place] pig" with the number of medications she was on. Reported no dizziness, lightheadedness, chest pain or palpitations. Her breathing was better, but she noted some SOB with moderate activity.  Today  she returns to HF clinic in good spirits for pharmacist medication titration. At last visit with pharmacy HF clinic, Ms. Vaeth was instructed to start taking digoxin 0.125 mg daily which was prescribed 03/18/20 since patient refused to try a beta-blocker. PharmD at last visit discussed addition of  blocker (bisoprolol) but she adamantly refuses to try another  blocker as she had bad HA/migraines with metoprolol. Overall, she is feeling well today. Started taking digoxin during the first week of June. She has been able to tolerate all her medications with no adverse effects. No shortness of breath or wheezing. Had some lower extremity swelling in the first week of June. Took an extra furosemide, which resolved the LEE. Weights have been stable, no orthopnea/PND. Every now and then, gets dizziness even when sitting down which is not new. Reports intermittent headaches and migraines a few times a week. Denies chest pain or palpitations. She attempts to walk for exercise, but activity limited by sciatica and arthritis. Continues to smoke 1/2 PPD. Reports she has failed smoking cessation therapies in past but will continue to try to stop smoking. Her diet is improved, she mostly eats fish and chicken. Has cut back on chocolates and salty snacks. Able to conduct ADLs at home independently.  HF Medications: Entresto 97/103 mg BID Spironolactone 25 mg daily Hydralazine 50 mg TID Isosorbide mononitrate (Imdur) 30 mg daily Digoxin 0.125 mg daily  Furosemide 40 mg daily Potassium chloride 10 mEq BID  Has the patient been experiencing any  side effects to the medications prescribed?  No  Does the patient have any problems obtaining medications due to transportation or finances?   Yes - has [place] Medicaid but still has difficulties affording copays. We transferred her prescriptions to [facility] today and will have them waive all her copays.   Understanding of regimen: good Understanding of  indications: good Potential of compliance: good Patient understands to avoid NSAIDs. Patient understands to avoid decongestants.    Pertinent Lab Values: . Serum creatinine 1.20, BUN 15, Potassium 3.7, Sodium 137  Vital Signs: . Weight: 207.4 lbs (last clinic weight: 207.6 lbs) . Blood pressure: 110/74  . Heart rate: 75  Assessment: 1. Chronic systolic CHF due to iCM - echo 10/20 EF 25-30% - NYHA [name], stable  - Euvolemic on physical exam. Daily weights have been stable.  - Has not been on b-blocker due to wheezing from asthma. Refuses to re-try beta-blocker - had bad headaches with Metoprolol. - Continue Entresto 97-103 mg twice daily - Continue spironolactone 25 mg daily - Continue hydralazine 50 mg TID - Continue Imdur 30 daily   - Start Farxiga 10 mg daily. BMET today was stable, K+ 3.7, Scr 1.20. Obtain repeat BMET at next office visit. Patient given free Farxiga 30-day card this visit while PA for Wilder Glade is processed.  - Continue digoxin 0.125 mg daily. Heart rate control has improved. Unable to check level this morning since patient already took dose this morning. Instructed to hold dose in the morning of next visit. Recommend checking level at next visit. - Will plan to repeat echo 05/2020 per MD and return f/u w/ Dr. Haroldine Laws. If EF remains < 35%, will refer to EP for potential ICD.    -Discussed importance of low-sodium diet and daily weights   2. CAD S/P multiple PCIs - s/p PCI in [place] - s/p OM PCI '08-Dr Claiborne Billings - s/p LAD PCI/DES 2012 - s/p pLAD PCI with DES 06/19/2019- Dr Gwenlyn Found after patient turned down for CABG. Known occluded RCA with L-R collaterals, 65% mCFX-EF 25% - no ischemic CP. Continue medical therapy     3. HTN -Controlled on current regimen -Continue current regimen per above    4. COPD with ongoing tobacco use - Still smoking 1/2 PPD - encouraged to quit.  Previously tried Chantix but discontinued due to side effects/hallucinations   5.  OSA: - admits to poor compliance w/ CPAP - encouraged to improve compliance    Plan: 1) Medication changes: Based on clinical presentation, vital signs and recent labs will have her start Farxiga 10 mg daily. 2.) BMET today was stable, K+ 3.7, Scr 1.20. Recommend repeat BMET and digoxin level at next office visit 3) Follow-up: Office visit in 5-weeks with Dr. Amado Coe, PharmD PGY1 [place] Resident  Audry Riles, PharmD, BCPS, [facility], CPP Heart Failure Clinic Pharmacist [phone]

## 2020-05-15 NOTE — Telephone Encounter (Signed)
Patient Advocate Encounter   Received notification from [facility] Medicaid that prior authorization for Wilder Glade is required.   PA submitted on [facility] Tracks Confirmation #: [id number] W Recipient ID: [id number] S  Status is pending   Will continue to follow.  Audry Riles, PharmD, BCPS, BCCP, CPP Heart Failure Clinic Pharmacist [phone]

## 2020-05-15 NOTE — Patient Instructions (Addendum)
It was a pleasure seeing you today!  MEDICATIONS: -We are changing your medications today -Start Farxiga 10 mg (1 tablet) daily -Call if you have questions about your medications.  LABS: -We will call you if your labs need attention.  NEXT APPOINTMENT: Return to clinic in 5 weeks with Dr. Haroldine Laws. Remember to avoid taking digoxin in the morning of your next office visit.  In general, to take care of your heart failure: -Limit your fluid intake to 2 Liters (half-gallon) per day.   -Limit your salt intake to ideally 2-3 grams (2000-3000 mg) per day. -Weigh yourself daily and record, and bring that "weight diary" to your next appointment.  (Weight gain of 2-3 pounds in 1 day typically means fluid weight.) -The medications for your heart are to help your heart and help you live longer.   -Please contact us before stopping any of your heart medications.  Call the clinic at [phone] with questions or to reschedule future appointments.

## 2020-05-16 NOTE — Telephone Encounter (Signed)
Advanced Heart Failure Patient Advocate Encounter  Prior Authorization for Wilder Glade has been approved.    PA# [id number] Effective dates: 05/15/2020 - 05/10/2021  Audry Riles, PharmD, BCPS, BCCP, CPP Heart Failure Clinic Pharmacist [phone]

## 2020-05-22 NOTE — Telephone Encounter (Signed)
[facility] sent over part of the patient's prescriptions but not all of them. Her Ambien she would have to continue getting from [facility] until it runs out of refills, as it has already been transferred once.  Requested that [facility] send over the remainder of her prescriptions.  Will follow up.

## 2020-05-28 NOTE — Telephone Encounter (Signed)
It looks like [facility] has sent the remainder of her prescriptions over to [facility]. Called and spoke with the patient. She is going to call me after she sees her PCP on 07/23 so I can set her up to have her medications sent to her.  Charlann Boxer, CPhT

## 2020-06-18 ENCOUNTER — Other Ambulatory Visit (HOSPITAL_COMMUNITY): Payer: Self-pay | Admitting: Internal Medicine

## 2020-06-18 MED FILL — CETIRIZINE HCL 10 MG TABS: 10 | 30 days supply | Qty: 30 | Fill #0

## 2020-06-18 MED FILL — BENZONATATE 100 MG CAPS: 100 | 10 days supply | Qty: 30 | Fill #0

## 2020-06-18 MED FILL — POTASSIUM CL ER 10 MEQ TAB: 10 | 30 days supply | Qty: 60 | Fill #0

## 2020-06-18 MED FILL — NITROGLYCERIN 0.4 MG TAB SL: 0.4 | 8 days supply | Qty: 25 | Fill #0

## 2020-06-18 MED FILL — ATORVASTATIN 80 MG TABLET: 80 | 90 days supply | Qty: 90 | Fill #0

## 2020-06-18 MED FILL — DICLOFENAC SODIUM 1 % GEL: 1 | 31 days supply | Qty: 500 | Fill #0

## 2020-06-18 MED FILL — DIGOXIN 0.125 MG TABLET: 125 | 90 days supply | Qty: 90 | Fill #0

## 2020-06-18 MED FILL — ZOLPIDEM TARTRATE 10 MG TAB: 10 | 30 days supply | Qty: 15 | Fill #0

## 2020-06-18 MED FILL — CLOPIDOGREL 75 MG TABLET: 75 | 30 days supply | Qty: 30 | Fill #0

## 2020-06-18 MED FILL — ISOSORBIDE MONONITRATE ER 3: 30 | 90 days supply | Qty: 90 | Fill #0

## 2020-06-18 MED FILL — VIT D2 1.25 MG (50,000 UNIT: 1.25 MG | 28 days supply | Qty: 4 | Fill #0

## 2020-06-18 MED FILL — ENTRESTO 97 MG-103 MG TAB: 97-103 | 30 days supply | Qty: 60 | Fill #0

## 2020-06-18 MED FILL — predniSONE 20 MG TABS: 20 | 7 days supply | Qty: 10 | Fill #0

## 2020-06-18 MED FILL — FARXIGA 10 MG TABLET: 10 | 30 days supply | Qty: 30 | Fill #1

## 2020-06-19 ENCOUNTER — Other Ambulatory Visit: Payer: Self-pay

## 2020-06-19 ENCOUNTER — Ambulatory Visit (HOSPITAL_BASED_OUTPATIENT_CLINIC_OR_DEPARTMENT_OTHER)
Admission: RE | Admit: 2020-06-19 | Discharge: 2020-06-19 | Disposition: A | Discharge status: Home / Self Care | Payer: Medicaid Other | Source: Ambulatory Visit | Attending: Cardiology | Admitting: Cardiology

## 2020-06-19 ENCOUNTER — Ambulatory Visit (HOSPITAL_COMMUNITY)
Admission: RE | Admit: 2020-06-19 | Discharge: 2020-06-19 | Disposition: A | Discharge status: Home / Self Care | Payer: Medicaid Other | Source: Ambulatory Visit | Attending: Internal Medicine | Admitting: Internal Medicine

## 2020-06-19 VITALS — BP 135/85 | HR 78 | Wt 203.6 lb

## 2020-06-19 DIAGNOSIS — M1990 Unspecified osteoarthritis, unspecified site: Secondary | ICD-10-CM | POA: Diagnosis not present

## 2020-06-19 DIAGNOSIS — Z955 Presence of coronary angioplasty implant and graft: Secondary | ICD-10-CM | POA: Diagnosis not present

## 2020-06-19 DIAGNOSIS — Z720 Tobacco use: Secondary | ICD-10-CM | POA: Diagnosis not present

## 2020-06-19 DIAGNOSIS — Z7902 Long term (current) use of antithrombotics/antiplatelets: Secondary | ICD-10-CM | POA: Diagnosis not present

## 2020-06-19 DIAGNOSIS — F17210 Nicotine dependence, cigarettes, uncomplicated: Secondary | ICD-10-CM | POA: Insufficient documentation

## 2020-06-19 DIAGNOSIS — I25110 Atherosclerotic heart disease of native coronary artery with unstable angina pectoris: Secondary | ICD-10-CM | POA: Diagnosis not present

## 2020-06-19 DIAGNOSIS — G4733 Obstructive sleep apnea (adult) (pediatric): Secondary | ICD-10-CM | POA: Insufficient documentation

## 2020-06-19 DIAGNOSIS — J449 Chronic obstructive pulmonary disease, unspecified: Secondary | ICD-10-CM | POA: Insufficient documentation

## 2020-06-19 DIAGNOSIS — I252 Old myocardial infarction: Secondary | ICD-10-CM | POA: Insufficient documentation

## 2020-06-19 DIAGNOSIS — I5022 Chronic systolic (congestive) heart failure: Secondary | ICD-10-CM | POA: Insufficient documentation

## 2020-06-19 DIAGNOSIS — Z7984 Long term (current) use of oral hypoglycemic drugs: Secondary | ICD-10-CM | POA: Insufficient documentation

## 2020-06-19 DIAGNOSIS — I2510 Atherosclerotic heart disease of native coronary artery without angina pectoris: Secondary | ICD-10-CM | POA: Diagnosis not present

## 2020-06-19 DIAGNOSIS — I110 Hypertensive heart disease with heart failure: Secondary | ICD-10-CM | POA: Diagnosis not present

## 2020-06-19 DIAGNOSIS — E785 Hyperlipidemia, unspecified: Secondary | ICD-10-CM | POA: Diagnosis not present

## 2020-06-19 DIAGNOSIS — Z79899 Other long term (current) drug therapy: Secondary | ICD-10-CM | POA: Diagnosis not present

## 2020-06-19 DIAGNOSIS — Z7982 Long term (current) use of aspirin: Secondary | ICD-10-CM | POA: Insufficient documentation

## 2020-06-19 LAB — ECHOCARDIOGRAM COMPLETE
Area-P 1/2: 2.07 cm2
Calc EF: 41 %
S' Lateral: 4 cm
Single Plane A2C EF: 44.7 %
Single Plane A4C EF: 40.5 %

## 2020-06-19 LAB — BASIC METABOLIC PANEL
Anion gap: 9 (ref 5–15)
BUN: 16 mg/dL (ref 6–20)
CO2: 24 mmol/L (ref 22–32)
Calcium: 9.5 mg/dL (ref 8.9–10.3)
Chloride: 103 mmol/L (ref 98–111)
Creatinine, Ser: 1.33 mg/dL — ABNORMAL HIGH (ref 0.44–1.00)
GFR calc Af Amer: 51 mL/min — ABNORMAL LOW (ref >60)
GFR calc non Af Amer: 44 mL/min — ABNORMAL LOW (ref >60)
Glucose, Bld: 110 mg/dL — ABNORMAL HIGH (ref 70–99)
Potassium: 4.2 mmol/L (ref 3.5–5.1)
Sodium: 136 mmol/L (ref 135–145)

## 2020-06-19 LAB — BRAIN NATRIURETIC PEPTIDE: B Natriuretic Peptide: 37.9 pg/mL (ref 0.0–100.0)

## 2020-06-19 MED ORDER — HYDRALAZINE HCL 25 MG PO TABS
25.0000 mg | ORAL_TABLET | Freq: Three times a day (TID) | ORAL | 3 refills | Status: DC
Start: 2020-06-19 — End: 2020-10-21

## 2020-06-19 MED FILL — hydrALAZINE HCL 25 MG TABS: 25 | 30 days supply | Qty: 90 | Fill #0

## 2020-06-19 NOTE — Progress Notes (Signed)
ADVANCED HF CLINIC PROGRESS NOTE  Primary Care: Dr. Tacy Dura  Primary Cardiologist: Dr. Gwenlyn Found [facility]: Dr. 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 Gwenlyn Found to [facility] for further HF evaluation.   She had a remote PCI in 2004 in [place]. In 2008 she had a circumflex PCI by Dr. Claiborne Billings. She had known occluded RCA with left-to-right collaterals. In 2012 she had an LAD stent placedby Dr. Claiborne Billings.  She was admitted 06/11/2019 with acute respiratory failure and unstable angina. She had new LV dysfunction with an EF of 25%. She had acute kidney injury as well with a creatinine of 1.8. the patient was admitted, diuresed, and underwent diagnostic catheterization 06/15/2019. This revealed a 60 to 70% mid circumflex stenosis, known occluded RCA with left-to-right collaterals, and a 95% proximal LAD before the previously placed stent which was 50 to 60% narrowed. After review the patient was turned down for CABG and she underwent intervention to her LAD on 06/19/2019 by Dr. Gwenlyn Found.  She had a f/u echo 10/20 which revealed her EF to be unchanged at 25-30%.  Here for routine f/u has been seen in APP/PharmD Clinic for med titration. Continue to refuse to try another  blocker as she had bad HA/migraines w/ metoprolol. Says she feels good. Much improved with Farxiga and digoxin. Denies SOB, orthopnea or edema. Taking all meds without problem. No dizziness.  Echo today 06/19/20 EF 40-45% RV ok Personally reviewed   Past Medical History:  Diagnosis Date  . Anxiety   . Arthritis   . Asthma   . Barrett's esophagus   . Chest pain   . Collagen vascular disease ([place])   . Colon polyps   . Coronary artery disease   . Depression   . Dyslipidemia   . GERD (gastroesophageal reflux disease)    occ  . Hyperlipidemia   .  Hypertension   . Myocardial infarct ([place]) [id number]  . Sciatica   . Sleep apnea    no CPAP ordered but using oxygen at bedtime  . Tobacco abuse     Current Outpatient Medications  Medication Sig Dispense Refill  . albuterol (PROVENTIL HFA;VENTOLIN HFA) 108 (90 BASE) MCG/ACT inhaler Inhale 2 puffs into the lungs every 6 (six) hours as needed for wheezing or shortness of breath.    Marland Kitchen albuterol (PROVENTIL) (2.5 MG/3ML) 0.083% nebulizer solution Take 6 mLs by nebulization every 6 (six) hours as needed for wheezing or shortness of breath.    Marland Kitchen aspirin EC 81 MG EC tablet Take 1 tablet (81 mg total) by mouth daily. 60 tablet 1  . clopidogrel (PLAVIX) 75 MG tablet Take 1 tablet (75 mg total) by mouth daily with breakfast. 60 tablet 1  . dapagliflozin propanediol (FARXIGA) 10 MG TABS tablet Take 1 tablet (10 mg total) by mouth daily before breakfast. 30 tablet 11  . digoxin (LANOXIN) 0.125 MG tablet Take 1 tablet (0.125 mg total) by mouth daily. 30 tablet 6  . fluticasone-salmeterol (ADVAIR HFA) 115-21 MCG/ACT inhaler Inhale 2 puffs into the lungs as needed.    . furosemide (LASIX) 40 MG tablet Take 1 tablet (40 mg total) by mouth daily. 60 tablet 0  . isosorbide mononitrate (IMDUR) 30 MG 24 hr tablet Take 1 tablet (30 mg total) by mouth daily. 30 tablet 1  . NARCAN 4  MG/0.1ML LIQD nasal spray kit Place 1 spray into the nose once.     . nitroGLYCERIN (NITROSTAT) 0.4 MG SL tablet Place 1 tablet (0.4 mg total) under the tongue every 5 (five) minutes as needed for chest pain. 25 tablet 6  . potassium chloride (KLOR-CON) 8 MEQ tablet Take 10 mEq by mouth 2 (two) times daily.    . sacubitril-valsartan (ENTRESTO) 97-103 MG Take 1 tablet by mouth 2 (two) times daily. 60 tablet 3  . spironolactone (ALDACTONE) 25 MG tablet Take 1 tablet (25 mg total) by mouth daily. 30 tablet 3  . VOLTAREN 1 % GEL SMARTSIG:4 Gram(s) Topical 4 Times Daily PRN    . zolpidem (AMBIEN) 10 MG tablet Take 10 mg by mouth at bedtime  as needed for sleep.     No current facility-administered medications for this encounter.    Allergies  Allergen Reactions  . Coreg [Carvedilol] Other (See Comments)    Headache  . Other Anaphylaxis and Hives    Fresh strawberries (throat swelled)  . Amoxicillin Nausea And Vomiting    Did it involve swelling of the face/tongue/throat, SOB, or low BP? Yes Did it involve sudden or severe rash/hives, skin peeling, or any reaction on the inside of your mouth or nose? Yes Did you need to seek medical attention at a hospital or doctor's office? Yes When did it last happen?within last 10 years If all above answers are "NO", may proceed with cephalosporin use.   . Dilaudid [Hydromorphone Hcl] Nausea And Vomiting  . Ibuprofen Other (See Comments)    wheezing  . Latex Swelling    No reaction with bandaids  . Metoprolol     HEADACHE and Wheezing      Social History   Socioeconomic History  . Marital status: Single    Spouse name: Not on file  . Number of children: 2  . Years of education: 65  . Highest education level: High school graduate  Occupational History  . Not on file  Tobacco Use  . Smoking status: Former Smoker    Packs/day: 0.50    Years: 34.00    Pack years: 17.00    Types: Cigarettes  . Smokeless tobacco: Never Used  Vaping Use  . Vaping Use: Never used  Substance and Sexual Activity  . Alcohol use: No    Alcohol/week: 0.0 standard drinks    Comment: seldom  . Drug use: Yes    Types: Marijuana    Comment: 06/19/19  . Sexual activity: Yes    Partners: Male    Birth control/protection: Post-menopausal  Other Topics Concern  . Not on file  Social History Narrative  . Not on file   Social Determinants of Health   Financial Resource Strain: Low Risk   . Difficulty of Paying Living Expenses: Not hard at all  Food Insecurity: No Food Insecurity  . Worried About Charity fundraiser in the Last Year: Never true  . Ran Out of Food in the Last Year:  Never true  Transportation Needs: No Transportation Needs  . Lack of Transportation (Medical): No  . Lack of Transportation (Non-Medical): No  Physical Activity: Inactive  . Days of Exercise per Week: 0 days  . Minutes of Exercise per Session: 0 min  Stress: Stress Concern Present  . Feeling of Stress : To some extent  Social Connections:   . Frequency of Communication with Friends and Family:   . Frequency of Social Gatherings with Friends and Family:   .  Attends Religious Services:   . Active Member of Clubs or Organizations:   . Attends Archivist Meetings:   Marland Kitchen Marital Status:   Intimate Partner Violence:   . Fear of Current or Ex-Partner:   . Emotionally Abused:   Marland Kitchen Physically Abused:   . Sexually Abused:       Family History  Problem Relation Age of Onset  . Leukemia Mother   . Clotting disorder Father        blood clot  . Hypertension Sister   . Diabetes Sister   . Stroke Sister 65  . Bleeding Disorder Son        ITP "free bleeding disorder"  . Colon cancer Paternal Grandmother   . Diabetes Paternal Grandmother   . Stomach cancer Neg Hx   . Pancreatic cancer Neg Hx     Vitals:   06/19/20 1214  BP: (!) 135/85  Pulse: 78  SpO2: 98%  Weight: (!) 92.4 kg (203 lb 9.6 oz)    PHYSICAL EXAM: General:  Well appearing. No resp difficulty HEENT: normal Neck: supple. no JVD. Carotids 2+ bilat; no bruits. No lymphadenopathy or thryomegaly appreciated. Cor: PMI nondisplaced. Regular rate & rhythm. No rubs, gallops or murmurs. Lungs: mild EE wheeze Abdomen: obese soft, nontender, nondistended. No hepatosplenomegaly. No bruits or masses. Good bowel sounds. Extremities: no cyanosis, clubbing, rash, edema Neuro: alert & orientedx3, cranial nerves grossly intact. moves all 4 extremities w/o difficulty. Affect pleasant  ASSESSMENT & PLAN:  1. Chronic systolic CHF due to iCM - echo 10/20 EF 25-30% - Much improved NYHA I-II - Echo today EF 40-45% Personally  reviewed - Volume status ok - Continue Entresto 97-103 twice daily - Continue spironolactone 25 mg daily - Not taking hydralazine. Will resume hydralazine at 25 tid - On Imdur 30 dail - Continue Farxiga 10  - Continue digoxin 0.125 - Has not been on b-blocker due to wheezing from asthma. Also had severe HA with metoprolol and very reluctant to retry. Can consider careful challenge with bisoprolol 2,5 at some point.  - Can also consdier corlanor if unable to achieve HR < 70 bpm - Labs today   2. CAD S/P multiple PCIs - s/p PCI in [place] - s/p OM PCI '08-Dr Claiborne Billings - s/p LAD PCI/DES 2012 - s/p pLAD PCI with DES 06/19/2019- Dr Gwenlyn Found after patient turned down for CABG. Known occluded RCA with L-R collaterals, 65% mCFX-EF 25% - No s/s ischemia - Continue ASA/statin - manged by Dr. Gwenlyn Found   3. HTN - Blood pressure improving - add back hydralazine  4. COPD with ongoing tobacco use - continues to smoke but has cut down from 1/2 ppd>> 1/4 PPD  - encouraged complete cessation  5. OSA: - admits to poor compliance w/ CPAP - encouraged to try and do better.   12:38 PM

## 2020-06-19 NOTE — Patient Instructions (Signed)
Lab work done today. We will notify you of any abnormal lab work. No news is good news!  INCREASE Hydralazine to 25mg  tab three times daily.  Please follow up with the [facility] in 4 months.   At the [facility], you and your health needs are our priority. As part of our continuing mission to provide you with exceptional heart care, we have created designated Provider Care Teams. These Care Teams include your primary Cardiologist (physician) and Advanced Practice Providers (APPs- Physician Assistants and Nurse Practitioners) who all work together to provide you with the care you need, when you need it.   You may see any of the following providers on your designated Care Team at your next follow up: Marland Kitchen Dr Glori Bickers . Dr Loralie Champagne . [name], NP . Lyda Jester, PA . Audry Riles, PharmD   Please be sure to bring in all your medications bottles to every appointment.

## 2020-06-19 NOTE — Progress Notes (Signed)
  Echocardiogram 2D Echocardiogram has been performed.  Darlina Sicilian M 06/19/2020, 11:25 AM

## 2020-07-31 MED FILL — ZOLPIDEM TARTRATE 10 MG TAB: 10 | 30 days supply | Qty: 15 | Fill #1

## 2020-07-31 MED FILL — BENZONATATE 100 MG CAPS: 100 | 10 days supply | Qty: 30 | Fill #1

## 2020-08-05 ENCOUNTER — Other Ambulatory Visit: Payer: Self-pay

## 2020-08-05 ENCOUNTER — Ambulatory Visit (INDEPENDENT_AMBULATORY_CARE_PROVIDER_SITE_OTHER): Payer: Medicaid Other | Admitting: Obstetrics

## 2020-08-05 ENCOUNTER — Other Ambulatory Visit (HOSPITAL_COMMUNITY)
Admission: RE | Admit: 2020-08-05 | Discharge: 2020-08-05 | Disposition: A | Discharge status: Home / Self Care | Payer: Medicaid Other | Source: Ambulatory Visit | Attending: Obstetrics | Admitting: Obstetrics

## 2020-08-05 ENCOUNTER — Encounter: Payer: Self-pay | Admitting: Obstetrics

## 2020-08-05 ENCOUNTER — Other Ambulatory Visit: Payer: Self-pay | Admitting: Cardiology

## 2020-08-05 VITALS — BP 132/94 | HR 94 | Ht 61.0 in | Wt 200.6 lb

## 2020-08-05 DIAGNOSIS — E669 Obesity, unspecified: Secondary | ICD-10-CM

## 2020-08-05 DIAGNOSIS — Z01419 Encounter for gynecological examination (general) (routine) without abnormal findings: Secondary | ICD-10-CM

## 2020-08-05 DIAGNOSIS — N898 Other specified noninflammatory disorders of vagina: Secondary | ICD-10-CM | POA: Insufficient documentation

## 2020-08-05 DIAGNOSIS — Z113 Encounter for screening for infections with a predominantly sexual mode of transmission: Secondary | ICD-10-CM | POA: Insufficient documentation

## 2020-08-05 DIAGNOSIS — B369 Superficial mycosis, unspecified: Secondary | ICD-10-CM | POA: Diagnosis not present

## 2020-08-05 MED ORDER — CLOTRIMAZOLE-BETAMETHASONE 1-0.05 % EX CREA: 1.0000 "application " | TOPICAL_CREAM | Freq: Two times a day (BID) | CUTANEOUS | 0 refills | Status: DC

## 2020-08-05 MED FILL — CLOTRIMAZOLE-BETAMETHASONE: 1-0.05 | 14 days supply | Qty: 45 | Fill #0

## 2020-08-05 NOTE — Progress Notes (Signed)
Subjective:        Tracey Manning is a 59 y.o. female here for a routine exam.  Current complaints: Itching at bottom of vagina.    Personal health questionnaire:  Is patient [name], have a family history of breast and/or ovarian cancer: no Is there a family history of uterine cancer diagnosed at age < 50, gastrointestinal cancer, urinary tract cancer, family member who is a Field seismologist syndrome-associated carrier: no Is the patient overweight and hypertensive, family history of diabetes, personal history of gestational diabetes, preeclampsia or PCOS: yes Is patient over 57, have PCOS,  family history of premature CHD under age 74, diabetes, smoke, have hypertension or peripheral artery disease:  no At any time, has a partner hit, kicked or otherwise hurt or frightened you?: no Over the past 2 weeks, have you felt down, depressed or hopeless?: no Over the past 2 weeks, have you felt little interest or pleasure in doing things?:no   Gynecologic History No LMP recorded (lmp unknown). Patient is postmenopausal. Contraception: post menopausal status Last Pap: 06-05-2019. Results were: normal Last mammogram: 8-27- 2020. Results were: normal  Obstetric History OB History  Gravida Para Term Preterm AB Living  8       6 2   SAB TAB Ectopic Multiple Live Births  6       2    # Outcome Date GA Lbr Len/2nd Weight Sex Delivery Anes PTL Lv  8 Gravida           7 Gravida           6 SAB           5 SAB           4 SAB           3 SAB           2 SAB           1 SAB             Past Medical History:  Diagnosis Date  . Anxiety   . Arthritis   . Asthma   . Barrett's esophagus   . Chest pain   . Collagen vascular disease ([place])   . Colon polyps   . Coronary artery disease   . Depression   . Dyslipidemia   . GERD (gastroesophageal reflux disease)    occ  . Hyperlipidemia   . Hypertension   . Myocardial infarct ([place]) [id number]  . Sciatica   . Sleep apnea    no CPAP  ordered but using oxygen at bedtime  . Tobacco abuse     Past Surgical History:  Procedure Laterality Date  . CARDIAC CATHETERIZATION  10/09/2003   [place], [place]) - LAD with 30% prox narrowing, 50% stenosis in mid-portion of PLA; RCA with 40% narrowing proximally ([name], [name])  . CARDIAC CATHETERIZATION  10/10/2007   70% stenosis in first septal perforator branch of LAD, 60-70% narrowing in mid LAD, 20% narrowing in mid AV groove Cfx with 80% diffuse narrowing in small distal marginal, total occlusion of mid RCA with L to R collaterals, 90% stenosis diffusely in prox branch of RCA followed by 70% stenosis in secondary curve & 80% stenosis in small marginal branch ([name])  . CARDIAC CATHETERIZATION  05/28/2008   normal L main, RCA with 100% prox lesion w/distal filling from LAD collaterals, LAD with 20% prox tubular lesion/ 40% mid LAD lesion/previous stent patent ([name])  .  CARDIAC CATHETERIZATION  09/15/2009   discrete 100% osital RCA lesion, 50% prox LAD lesion, non-obstructive disease in all coronaries ([name])  . CESAREAN SECTION    . COLONOSCOPY    . COLONOSCOPY W/ POLYPECTOMY    . CORONARY ANGIOPLASTY WITH STENT PLACEMENT  10/31/2007   PCI of distal Cfx with 2.25x53m Taxus Adam DES, 60% narrowing of mid LAD (Dr. TCorky Downs  . CORONARY ANGIOPLASTY WITH STENT PLACEMENT  06/11/2011   PCI of prox-mid LAD with 3x171mDES Resolute (Dr. T.[name] . CORONARY STENT INTERVENTION N/A 06/19/2019   Procedure: CORONARY STENT INTERVENTION;  Surgeon: BeLorretta HarpMD;  [facility];  Service: Cardiovascular;  Laterality: N/A;  LAD  . DILATION AND CURETTAGE, DIAGNOSTIC / THERAPEUTIC    . DILITATION & CURRETTAGE/HYSTROSCOPY WITH HYDROTHERMAL ABLATION N/A 04/03/2016   Procedure: DILATATION & CURETTAGE/HYSTEROSCOPY WITH HYDROTHERMAL ABLATION;  Surgeon: ChShelly BombardMD;  [facility];  Service: Gynecology;  Laterality: N/A;  . NM  MYOCAR PERF WALL MOTION  08/2009   persantine myoview - normal perfusion in all regions, perfusion defect in anterior region (breast attenuation), EF 52%, low risk scan  . RIGHT/LEFT HEART CATH AND CORONARY ANGIOGRAPHY N/A 06/15/2019   Procedure: RIGHT/LEFT HEART CATH AND CORONARY ANGIOGRAPHY;  Surgeon: SmBelva CromeMD;  [facility];  Service: Cardiovascular;  Laterality: N/A;     Current Outpatient Medications:  .  albuterol (PROVENTIL HFA;VENTOLIN HFA) 108 (90 BASE) MCG/ACT inhaler, Inhale 2 puffs into the lungs every 6 (six) hours as needed for wheezing or shortness of breath., Disp: , Rfl:  .  albuterol (PROVENTIL) (2.5 MG/3ML) 0.083% nebulizer solution, Take 6 mLs by nebulization every 6 (six) hours as needed for wheezing or shortness of breath., Disp: , Rfl:  .  aspirin EC 81 MG EC tablet, Take 1 tablet (81 mg total) by mouth daily., Disp: 60 tablet, Rfl: 1 .  clopidogrel (PLAVIX) 75 MG tablet, Take 1 tablet (75 mg total) by mouth daily with breakfast., Disp: 60 tablet, Rfl: 1 .  dapagliflozin propanediol (FARXIGA) 10 MG TABS tablet, Take 1 tablet (10 mg total) by mouth daily before breakfast., Disp: 30 tablet, Rfl: 11 .  digoxin (LANOXIN) 0.125 MG tablet, Take 1 tablet (0.125 mg total) by mouth daily., Disp: 30 tablet, Rfl: 6 .  fluticasone-salmeterol (ADVAIR HFA) 115-21 MCG/ACT inhaler, Inhale 2 puffs into the lungs as needed., Disp: , Rfl:  .  furosemide (LASIX) 40 MG tablet, Take 1 tablet (40 mg total) by mouth daily., Disp: 60 tablet, Rfl: 0 .  hydrALAZINE (APRESOLINE) 25 MG tablet, Take 1 tablet (25 mg total) by mouth 3 (three) times daily., Disp: 270 tablet, Rfl: 3 .  isosorbide mononitrate (IMDUR) 30 MG 24 hr tablet, Take 1 tablet (30 mg total) by mouth daily., Disp: 30 tablet, Rfl: 1 .  NARCAN 4 MG/0.1ML LIQD nasal spray kit, Place 1 spray into the nose once. , Disp: , Rfl:  .  nitroGLYCERIN (NITROSTAT) 0.4 MG SL tablet, Place 1 tablet (0.4 mg total) under the  tongue every 5 (five) minutes as needed for chest pain., Disp: 25 tablet, Rfl: 6 .  potassium chloride (KLOR-CON) 8 MEQ tablet, Take 10 mEq by mouth 2 (two) times daily., Disp: , Rfl:  .  spironolactone (ALDACTONE) 25 MG tablet, Take 1 tablet (25 mg total) by mouth daily., Disp: 30 tablet, Rfl: 3 .  VOLTAREN 1 % GEL, SMARTSIG:4 Gram(s) Topical 4 Times Daily PRN, Disp: , Rfl:  .  zolpidem (AMBIEN) 10 MG tablet, Take 10 mg by mouth at bedtime as needed for sleep., Disp: , Rfl:  .  clotrimazole-betamethasone (LOTRISONE) cream, Apply 1 application topically 2 (two) times daily. Use for a maximum of up to 14 days per treatment course., Disp: 45 g, Rfl: 0 .  ENTRESTO 97-103 MG, TAKE 1 TABLET BY MOUTH TWO TIMES DAILY, Disp: 60 tablet, Rfl: 5 Allergies  Allergen Reactions  . Coreg [Carvedilol] Other (See Comments)    Headache  . Other Anaphylaxis and Hives    Fresh strawberries (throat swelled)  . Amoxicillin Nausea And Vomiting    Did it involve swelling of the face/tongue/throat, SOB, or low BP? Yes Did it involve sudden or severe rash/hives, skin peeling, or any reaction on the inside of your mouth or nose? Yes Did you need to seek medical attention at a hospital or doctor's office? Yes When did it last happen?within last 10 years If all above answers are "NO", may proceed with cephalosporin use.   . Dilaudid [Hydromorphone Hcl] Nausea And Vomiting  . Ibuprofen Other (See Comments)    wheezing  . Latex Swelling    No reaction with bandaids  . Metoprolol     HEADACHE and Wheezing    Social History   Tobacco Use  . Smoking status: Current Every Day Smoker    Packs/day: 0.50    Years: 34.00    Pack years: 17.00    Types: Cigarettes  . Smokeless tobacco: Never Used  Substance Use Topics  . Alcohol use: No    Alcohol/week: 0.0 standard drinks    Comment: seldom    Family History  Problem Relation Age of Onset  . Leukemia Mother   . Clotting disorder Father        blood clot   . Hypertension Sister   . Diabetes Sister   . Stroke Sister 30  . Bleeding Disorder Son        ITP "free bleeding disorder"  . Colon cancer Paternal Grandmother   . Diabetes Paternal Grandmother   . Stomach cancer Neg Hx   . Pancreatic cancer Neg Hx       Review of Systems  Constitutional: negative for fatigue and weight loss Respiratory: negative for cough and wheezing Cardiovascular: negative for chest pain, fatigue and palpitations Gastrointestinal: negative for abdominal pain and change in bowel habits Musculoskeletal:negative for myalgias Neurological: negative for gait problems and tremors Behavioral/Psych: negative for abusive relationship, depression Endocrine: negative for temperature intolerance    Genitourinary:negative for abnormal menstrual periods, genital lesions, hot flashes, sexual problems and vaginal discharge.  Positive for vaginal itching in perianal area Integument/breast: negative for breast lump, breast tenderness, nipple discharge and skin lesion(s)    Objective:       BP (!) 132/94   Pulse 94   Ht 5' 1"  (1.549 m)   Wt 200 lb 9.6 oz (91 kg)   LMP  (LMP Unknown)   BMI 37.90 kg/m  General:   alert and no distress  Skin:   no rash or abnormalities  Lungs:   clear to auscultation bilaterally  Heart:   regular rate and rhythm, S1, S2 normal, no murmur, click, rub or gallop  Breasts:   normal without suspicious masses, skin or nipple changes or axillary nodes  Abdomen:  normal findings: no organomegaly, soft, non-tender and no hernia  Pelvis:  External genitalia: normal general appearance Urinary system: urethral meatus normal and bladder without fullness, nontender Vaginal: normal without tenderness, induration or masses  Cervix: normal appearance Adnexa: normal bimanual exam Uterus: anteverted and non-tender, normal size   Lab Review Urine pregnancy test Labs reviewed yes Radiologic studies reviewed yes  50% of 20 min visit spent on  counseling and coordination of care.   Assessment:     1. Encounter for gynecological examination with Papanicolaou smear of cervix Rx: - Cervicovaginal ancillary only( [place]) - Hepatitis B surface antigen - Hepatitis C antibody - HIV Antibody (routine testing w rflx) - RPR  2. Vaginal discharge Rx: - Cervicovaginal ancillary only( [place])  3. Screen for STD (sexually transmitted disease) Rx: - Cytology - PAP( [place]) - Hepatitis B surface antigen - Hepatitis C antibody - HIV Antibody (routine testing w rflx) - RPR  4. Superficial fungus infection of skin Rx: - clotrimazole-betamethasone (LOTRISONE) cream; Apply 1 application topically 2 (two) times daily. Use for a maximum of up to 14 days per treatment course.  Dispense: 45 g; Refill: 0  5. Obesity (BMI 30-39.9) - program of caloric reduction, exercise and behavioral modification recommended    Plan:    Education reviewed: calcium supplements, depression evaluation, low fat, low cholesterol diet, safe sex/STD prevention, self breast exams, smoking cessation and weight bearing exercise. Follow up in: 1 year.   Meds ordered this encounter  Medications  . clotrimazole-betamethasone (LOTRISONE) cream    Sig: Apply 1 application topically 2 (two) times daily. Use for a maximum of up to 14 days per treatment course.    Dispense:  45 g    Refill:  0   Orders Placed This Encounter  Procedures  . Hepatitis B surface antigen  . Hepatitis C antibody  . HIV Antibody (routine testing w rflx)  . RPR    [name], MD 08/05/2020 3:58 PM

## 2020-08-05 NOTE — Progress Notes (Signed)
Pt presents for annual, pap, and all STD testing Normal Mammogram 07/20/2019 - pt will schedule next mammogram  Normal Colonoscopy 2 yrs ago per pt

## 2020-08-06 ENCOUNTER — Telehealth: Payer: Self-pay

## 2020-08-06 ENCOUNTER — Other Ambulatory Visit: Payer: Self-pay | Admitting: Obstetrics

## 2020-08-06 DIAGNOSIS — B3731 Acute candidiasis of vulva and vagina: Secondary | ICD-10-CM

## 2020-08-06 DIAGNOSIS — A549 Gonococcal infection, unspecified: Secondary | ICD-10-CM

## 2020-08-06 LAB — CERVICOVAGINAL ANCILLARY ONLY
Bacterial Vaginitis (gardnerella): NEGATIVE
Candida Glabrata: NEGATIVE
Candida Vaginitis: POSITIVE — AB
Chlamydia: NEGATIVE
Comment: NEGATIVE
Comment: NEGATIVE
Comment: NEGATIVE
Comment: NEGATIVE
Comment: NEGATIVE
Comment: NORMAL
Neisseria Gonorrhea: POSITIVE — AB
Trichomonas: NEGATIVE

## 2020-08-06 LAB — HEPATITIS C ANTIBODY: Hep C Virus Ab: <0.1 s/co ratio (ref 0.0–0.9)

## 2020-08-06 LAB — CYTOLOGY - PAP
Comment: NEGATIVE
Diagnosis: NEGATIVE
High risk HPV: NEGATIVE

## 2020-08-06 LAB — HEPATITIS B SURFACE ANTIGEN: Hepatitis B Surface Ag: NEGATIVE

## 2020-08-06 LAB — RPR: RPR Ser Ql: NONREACTIVE

## 2020-08-06 LAB — HIV ANTIBODY (ROUTINE TESTING W REFLEX): HIV Screen 4th Generation wRfx: NONREACTIVE

## 2020-08-06 MED ORDER — CEFTRIAXONE SODIUM 500 MG IJ SOLR: 500.0000 mg | Freq: Once | INTRAMUSCULAR | Status: DC

## 2020-08-06 MED ORDER — FLUCONAZOLE 200 MG PO TABS: 200.0000 mg | ORAL_TABLET | ORAL | 2 refills | Status: DC

## 2020-08-06 MED FILL — FLUCONAZOLE 200 MG TAB: 200 | 9 days supply | Qty: 3 | Fill #0

## 2020-08-06 MED FILL — FARXIGA 10 MG TABLET: 10 | 30 days supply | Qty: 30 | Fill #2

## 2020-08-06 NOTE — Telephone Encounter (Signed)
Patient has been informed of test results and has been schedule to come in tomorrow for her rocephin injection.She was advised to make her partner aware of infection. Patient should abstain form any sexual intercourse for at least 7-10 days until her and partners have been treated.

## 2020-08-06 NOTE — Telephone Encounter (Signed)
-----   Message from [name], MD sent at 08/06/2020  1:44 PM EDT ----- Rocephin Rx for GC Diflucan Rx for yeast

## 2020-08-07 ENCOUNTER — Ambulatory Visit (INDEPENDENT_AMBULATORY_CARE_PROVIDER_SITE_OTHER): Payer: Medicaid Other

## 2020-08-07 ENCOUNTER — Other Ambulatory Visit: Payer: Self-pay

## 2020-08-07 VITALS — Wt 199.7 lb

## 2020-08-07 DIAGNOSIS — A549 Gonococcal infection, unspecified: Secondary | ICD-10-CM

## 2020-08-07 MED ORDER — CEFTRIAXONE SODIUM 500 MG IJ SOLR
500.0000 mg | Freq: Once | INTRAMUSCULAR | Status: AC
  Administered 2020-08-07: 500 mg via INTRAMUSCULAR

## 2020-08-07 NOTE — Progress Notes (Signed)
RGYN pt presents for Rocephin injection.  Confirmed with Dr.Harper ok to treat with pt Amoxicillin Allergy  Also confirmed with pt verbally face to face pt states she can have Rocephin.    Injection given w/ any problems RUOQ   Pt made aware to make TOC appt in 4-6 wks advised  No unprotected intercourse .  Pt agreeable and voiced understanding.

## 2020-08-15 ENCOUNTER — Telehealth: Payer: Self-pay | Admitting: *Deleted

## 2020-08-15 MED FILL — FLUCONAZOLE 200 MG TAB: 200 | 14 days supply | Qty: 3 | Fill #1

## 2020-08-15 NOTE — Telephone Encounter (Signed)
A message was left, re: her follow up visit. 

## 2020-08-22 ENCOUNTER — Emergency Department (HOSPITAL_COMMUNITY)
Admission: EM | Admit: 2020-08-22 | Discharge: 2020-08-22 | Disposition: A | Discharge status: Home / Self Care | Payer: Medicaid Other | Attending: Emergency Medicine | Admitting: Emergency Medicine

## 2020-08-22 ENCOUNTER — Encounter (HOSPITAL_COMMUNITY): Payer: Self-pay | Admitting: Emergency Medicine

## 2020-08-22 DIAGNOSIS — Z951 Presence of aortocoronary bypass graft: Secondary | ICD-10-CM | POA: Insufficient documentation

## 2020-08-22 DIAGNOSIS — M79606 Pain in leg, unspecified: Secondary | ICD-10-CM | POA: Diagnosis not present

## 2020-08-22 DIAGNOSIS — I2510 Atherosclerotic heart disease of native coronary artery without angina pectoris: Secondary | ICD-10-CM | POA: Insufficient documentation

## 2020-08-22 DIAGNOSIS — J45909 Unspecified asthma, uncomplicated: Secondary | ICD-10-CM | POA: Insufficient documentation

## 2020-08-22 DIAGNOSIS — F17210 Nicotine dependence, cigarettes, uncomplicated: Secondary | ICD-10-CM | POA: Insufficient documentation

## 2020-08-22 DIAGNOSIS — I110 Hypertensive heart disease with heart failure: Secondary | ICD-10-CM | POA: Insufficient documentation

## 2020-08-22 DIAGNOSIS — M25551 Pain in right hip: Secondary | ICD-10-CM | POA: Insufficient documentation

## 2020-08-22 DIAGNOSIS — I5021 Acute systolic (congestive) heart failure: Secondary | ICD-10-CM | POA: Insufficient documentation

## 2020-08-22 DIAGNOSIS — Z7902 Long term (current) use of antithrombotics/antiplatelets: Secondary | ICD-10-CM | POA: Insufficient documentation

## 2020-08-22 DIAGNOSIS — J449 Chronic obstructive pulmonary disease, unspecified: Secondary | ICD-10-CM | POA: Insufficient documentation

## 2020-08-22 DIAGNOSIS — M25552 Pain in left hip: Secondary | ICD-10-CM

## 2020-08-22 DIAGNOSIS — Z7982 Long term (current) use of aspirin: Secondary | ICD-10-CM | POA: Diagnosis not present

## 2020-08-22 DIAGNOSIS — Z79899 Other long term (current) drug therapy: Secondary | ICD-10-CM | POA: Diagnosis not present

## 2020-08-22 DIAGNOSIS — Z91040 Latex allergy status: Secondary | ICD-10-CM | POA: Diagnosis not present

## 2020-08-22 MED ORDER — MORPHINE SULFATE (PF) 4 MG/ML IV SOLN
4.0000 mg | Freq: Once | INTRAVENOUS | Status: AC
  Administered 2020-08-22: 4 mg via INTRAMUSCULAR
  Filled 2020-08-22: qty 1

## 2020-08-22 MED ORDER — CYCLOBENZAPRINE HCL 10 MG PO TABS
10.0000 mg | ORAL_TABLET | Freq: Two times a day (BID) | ORAL | 0 refills | Status: DC | PRN
Start: 2020-08-22 — End: 2021-03-11

## 2020-08-22 MED FILL — CYCLOBENZAPRINE HCL 10 MG T: 10 | 10 days supply | Qty: 20 | Fill #0

## 2020-08-22 NOTE — ED Provider Notes (Signed)
[facility] DEPT Provider Note   CSN: [id number] Arrival date & time: 08/22/20  1414     History Chief Complaint  Patient presents with  . Sciatica    Tracey Manning is a 59 y.o. female.  The history is provided by the patient and medical records. No language interpreter was used.     59 year old female significant history of obesity, chronic pain, CHF, hypertension, sciatica, brought here via EMS from home for evaluation of leg pain.  Patient report yesterday as she was turning herself she noticed pain to her right hip.  Pain is sharp, shooting, worse with movement.  She report pain radiates down to her right knee.  She tries to favor her left hip when she walks and this morning she woke up having pain in her left hip.  Pain is moderate in severity and increased with movement.  She does not complain of any fever chills no dysuria or hematuria no recent injury denies any numbness or significant back pain.  She tries taking 3 Tylenol at home without adequate relief.  She also tried muscle rubs without relief.  She mentioned having history of sciatica in the past but this felt different.  Past Medical History:  Diagnosis Date  . Anxiety   . Arthritis   . Asthma   . Barrett's esophagus   . Chest pain   . Collagen vascular disease ([place])   . Colon polyps   . Coronary artery disease   . Depression   . Dyslipidemia   . GERD (gastroesophageal reflux disease)    occ  . Hyperlipidemia   . Hypertension   . Myocardial infarct ([place]) [id number]  . Sciatica   . Sleep apnea    no CPAP ordered but using oxygen at bedtime  . Tobacco abuse     Patient Active Problem List   Diagnosis Date Noted  . Impingement syndrome of left shoulder 03/12/2020  . Chronic pain 07/18/2019  . CRI (chronic renal insufficiency), stage 3 (moderate) 07/18/2019  . COPD (chronic obstructive pulmonary disease) ([place]) 06/28/2019  . Obesity (BMI 30-39.9) 06/28/2019  . Ischemic  cardiomyopathy 06/28/2019  . Dyspnea on exertion 06/15/2019  . Acute systolic CHF (congestive heart failure) ([place])   . AKI (acute kidney injury) ([place])   . Abnormal uterine bleeding (AUB) 04/03/2016  . Dysfunctional uterine bleeding 11/14/2015  . Bacterial vaginosis (recurrent) 11/14/2015  . Chest pain 11/14/2015  . Essential hypertension 04/04/2014  . Dyslipidemia, goal LDL below 70 04/04/2014  . CAD S/P multiple PCIs 04/04/2014    Past Surgical History:  Procedure Laterality Date  . CARDIAC CATHETERIZATION  10/09/2003   [place], [place]) - LAD with 30% prox narrowing, 50% stenosis in mid-portion of PLA; RCA with 40% narrowing proximally ([name], [name])  . CARDIAC CATHETERIZATION  10/10/2007   70% stenosis in first septal perforator branch of LAD, 60-70% narrowing in mid LAD, 20% narrowing in mid AV groove Cfx with 80% diffuse narrowing in small distal marginal, total occlusion of mid RCA with L to R collaterals, 90% stenosis diffusely in prox branch of RCA followed by 70% stenosis in secondary curve & 80% stenosis in small marginal branch ([name])  . CARDIAC CATHETERIZATION  05/28/2008   normal L main, RCA with 100% prox lesion w/distal filling from LAD collaterals, LAD with 20% prox tubular lesion/ 40% mid LAD lesion/previous stent patent ([name])  . CARDIAC CATHETERIZATION  09/15/2009   discrete 100% osital RCA lesion, 50%  prox LAD lesion, non-obstructive disease in all coronaries ([name])  . CESAREAN SECTION    . COLONOSCOPY    . COLONOSCOPY W/ POLYPECTOMY    . CORONARY ANGIOPLASTY WITH STENT PLACEMENT  10/31/2007   PCI of distal Cfx with 2.25x23m Taxus Adam DES, 60% narrowing of mid LAD (Dr. TCorky Downs  . CORONARY ANGIOPLASTY WITH STENT PLACEMENT  06/11/2011   PCI of prox-mid LAD with 3x117mDES Resolute (Dr. T.[name] . CORONARY STENT INTERVENTION N/A 06/19/2019   Procedure: CORONARY STENT INTERVENTION;  Surgeon: BeLorretta HarpMD;   [facility];  Service: Cardiovascular;  Laterality: N/A;  LAD  . DILATION AND CURETTAGE, DIAGNOSTIC / THERAPEUTIC    . DILITATION & CURRETTAGE/HYSTROSCOPY WITH HYDROTHERMAL ABLATION N/A 04/03/2016   Procedure: DILATATION & CURETTAGE/HYSTEROSCOPY WITH HYDROTHERMAL ABLATION;  Surgeon: ChShelly BombardMD;  [facility];  Service: Gynecology;  Laterality: N/A;  . NM MYOCAR PERF WALL MOTION  08/2009   persantine myoview - normal perfusion in all regions, perfusion defect in anterior region (breast attenuation), EF 52%, low risk scan  . RIGHT/LEFT HEART CATH AND CORONARY ANGIOGRAPHY N/A 06/15/2019   Procedure: RIGHT/LEFT HEART CATH AND CORONARY ANGIOGRAPHY;  Surgeon: SmBelva CromeMD;  [facility];  Service: Cardiovascular;  Laterality: N/A;     OB History    Gravida  8   Para      Term      Preterm      AB  6   Living  2     SAB  6   TAB      Ectopic      Multiple      Live Births  2           Family History  Problem Relation Age of Onset  . Leukemia Mother   . Clotting disorder Father        blood clot  . Hypertension Sister   . Diabetes Sister   . Stroke Sister 4023. Bleeding Disorder Son        ITP "free bleeding disorder"  . Colon cancer Paternal Grandmother   . Diabetes Paternal Grandmother   . Stomach cancer Neg Hx   . Pancreatic cancer Neg Hx     Social History   Tobacco Use  . Smoking status: Current Every Day Smoker    Packs/day: 0.50    Years: 34.00    Pack years: 17.00    Types: Cigarettes  . Smokeless tobacco: Never Used  Vaping Use  . Vaping Use: Never used  Substance Use Topics  . Alcohol use: No    Alcohol/week: 0.0 standard drinks    Comment: seldom  . Drug use: Yes    Types: Marijuana    Comment: 06/19/19    Home Medications Prior to Admission medications   Medication Sig Start Date End Date Taking? Authorizing Provider  albuterol (PROVENTIL HFA;VENTOLIN HFA) 108 (90 BASE) MCG/ACT inhaler  Inhale 2 puffs into the lungs every 6 (six) hours as needed for wheezing or shortness of breath.    [provider]  albuterol (PROVENTIL) (2.5 MG/3ML) 0.083% nebulizer solution Take 6 mLs by nebulization every 6 (six) hours as needed for wheezing or shortness of breath. 03/27/19   [provider]  aspirin EC 81 MG EC tablet Take 1 tablet (81 mg total) by mouth daily. 06/21/19   HaKayleen MemosDO  clopidogrel (PLAVIX) 75 MG tablet Take 1 tablet (75  mg total) by mouth daily with breakfast. 06/20/19   Kayleen Memos, DO  clotrimazole-betamethasone (LOTRISONE) cream Apply 1 application topically 2 (two) times daily. Use for a maximum of up to 14 days per treatment course. 08/05/20   [name], MD  dapagliflozin propanediol (FARXIGA) 10 MG TABS tablet Take 1 tablet (10 mg total) by mouth daily before breakfast. 05/15/20   Bensimhon, Shaune Pascal, MD  digoxin (LANOXIN) 0.125 MG tablet Take 1 tablet (0.125 mg total) by mouth daily. 03/18/20   Larey Dresser, MD  ENTRESTO 97-103 MG TAKE 1 TABLET BY MOUTH TWO TIMES DAILY 08/05/20   Lyda Jester M, PA-C  fluconazole (DIFLUCAN) 200 MG tablet Take 1 tablet (200 mg total) by mouth every 3 (three) days. 08/06/20   [name], MD  fluticasone-salmeterol (ADVAIR HFA) [phone] MCG/ACT inhaler Inhale 2 puffs into the lungs as needed.    [provider]  furosemide (LASIX) 40 MG tablet Take 1 tablet (40 mg total) by mouth daily. 06/21/19   Kayleen Memos, DO  hydrALAZINE (APRESOLINE) 25 MG tablet Take 1 tablet (25 mg total) by mouth 3 (three) times daily. 06/19/20 09/17/20  Bensimhon, Shaune Pascal, MD  isosorbide mononitrate (IMDUR) 30 MG 24 hr tablet Take 1 tablet (30 mg total) by mouth daily. 06/21/19   Kayleen Memos, DO  NARCAN 4 MG/0.1ML LIQD nasal spray kit Place 1 spray into the nose once.  09/01/18   [provider]  nitroGLYCERIN (NITROSTAT) 0.4 MG SL tablet Place 1 tablet (0.4 mg total) under the tongue every 5 (five)  minutes as needed for chest pain. 01/14/18   [name], MD  potassium chloride (KLOR-CON) 8 MEQ tablet Take 10 mEq by mouth 2 (two) times daily.    [provider]  spironolactone (ALDACTONE) 25 MG tablet Take 1 tablet (25 mg total) by mouth daily. 01/08/20   Bensimhon, Shaune Pascal, MD  VOLTAREN 1 % GEL SMARTSIG:4 Gram(s) Topical 4 Times Daily PRN 09/22/19   [provider]  zolpidem (AMBIEN) 10 MG tablet Take 10 mg by mouth at bedtime as needed for sleep.    [provider]    Allergies    Coreg [carvedilol], Other, Amoxicillin, Dilaudid [hydromorphone hcl], Ibuprofen, Latex, and Metoprolol  Review of Systems   Review of Systems  Constitutional: Negative for fever.  Musculoskeletal: Positive for arthralgias.  Skin: Negative for wound.  Neurological: Negative for numbness.    Physical Exam Updated Vital Signs BP (!) 163/101 (BP Location: Left Arm)   Pulse 68   Temp 97.9 F (36.6 C) (Oral)   Resp 16   LMP  (LMP Unknown)   SpO2 98%   Physical Exam Vitals and nursing note reviewed.  Constitutional:      General: She is not in acute distress.    Appearance: She is well-developed. She is obese.     Comments: Patient laying on the bed favoring her right hip appears to be in no acute discomfort.  HENT:     Head: Atraumatic.  Eyes:     Conjunctiva/sclera: Conjunctivae normal.  Abdominal:     Palpations: Abdomen is soft.     Tenderness: There is no abdominal tenderness.  Musculoskeletal:        General: Tenderness (Tenderness to lumbar sacral region as well as both right and left lateral hip with normal hip range of motion) present.     Cervical back: Neck supple.  Skin:    Findings: No rash.  Neurological:  Mental Status: She is alert and oriented to person, place, and time.     Comments: Patellar deep tendon reflex intact bilaterally.  Able to ambulate.  Negative straight leg raise.     ED Results / Procedures / Treatments   Labs (all  labs ordered are listed, but only abnormal results are displayed) Labs Reviewed - No data to display  EKG None  Radiology No results found.  Procedures Procedures (including critical care time)  Medications Ordered in ED Medications  morphine 4 MG/ML injection 4 mg (4 mg Intramuscular Given 08/22/20 1725)    ED Course  I have reviewed the triage vital signs and the nursing notes.  Pertinent labs & imaging results that were available during my care of the patient were reviewed by me and considered in my medical decision making (see chart for details).    MDM Rules/Calculators/A&P                          BP (!) 163/101 (BP Location: Left Arm)   Pulse 68   Temp 97.9 F (36.6 C) (Oral)   Resp 16   LMP  (LMP Unknown)   SpO2 98%   Final Clinical Impression(s) / ED Diagnoses Final diagnoses:  Left hip pain    Rx / DC Orders ED Discharge Orders         Ordered    cyclobenzaprine (FLEXERIL) 10 MG tablet  2 times daily PRN        08/22/20 1750         5:12 PM Patient here with bilateral hip pain reproducible on exam.  No recent trauma.  Suspect this is likely muscle skeletal pain.  Doubt infection.  No red flags.  Ambulate without difficulty.  Agrees with providing symptomatic treatment here and discharged home with muscle relaxant and outpatient follow-up.  Return precaution discussed.   Domenic Moras, PA-C 08/22/20 1751    Truddie Hidden, MD 08/22/20 [id number]

## 2020-08-22 NOTE — ED Triage Notes (Signed)
Per EMS-patient complaining of right hip now left back radiating down left leg-possible sciatica-symptoms started last night

## 2020-08-22 NOTE — Discharge Instructions (Signed)
Continue to take over-the-counter Tylenol as needed for your pain.  Alternate between heat and ice as it will aid with recovery.  Take muscle relaxant as needed for muscle spasm.  Follow-up with your doctor for further care.

## 2020-08-23 MED FILL — ZOLPIDEM TARTRATE 10 MG TAB: 10 | 30 days supply | Qty: 15 | Fill #2

## 2020-08-26 ENCOUNTER — Other Ambulatory Visit: Payer: Self-pay | Admitting: Internal Medicine

## 2020-09-04 ENCOUNTER — Ambulatory Visit (INDEPENDENT_AMBULATORY_CARE_PROVIDER_SITE_OTHER): Payer: Medicaid Other

## 2020-09-04 ENCOUNTER — Other Ambulatory Visit: Payer: Self-pay

## 2020-09-04 ENCOUNTER — Other Ambulatory Visit (HOSPITAL_COMMUNITY)
Admission: RE | Admit: 2020-09-04 | Discharge: 2020-09-04 | Disposition: A | Discharge status: Home / Self Care | Payer: Medicaid Other | Source: Ambulatory Visit | Attending: Obstetrics and Gynecology | Admitting: Obstetrics and Gynecology

## 2020-09-04 VITALS — BP 141/92 | HR 97 | Wt 198.0 lb

## 2020-09-04 DIAGNOSIS — A549 Gonococcal infection, unspecified: Secondary | ICD-10-CM

## 2020-09-04 NOTE — Progress Notes (Signed)
SUBJECTIVE 59 y.o. GYN presents for TOC for +Gonorrhea. Self swab done  PLAN Self swab probe for GC done and sent to lab.  Will treat/call patient per results.

## 2020-09-04 NOTE — Progress Notes (Signed)
Chart reviewed for nurse visit. Agree with plan of care.   Noni Saupe I, NP 09/04/2020 1:22 PM

## 2020-09-05 LAB — CERVICOVAGINAL ANCILLARY ONLY
Candida Glabrata: NEGATIVE
Candida Vaginitis: NEGATIVE
Chlamydia: NEGATIVE
Comment: NEGATIVE
Comment: NEGATIVE
Comment: NEGATIVE
Comment: NORMAL
Neisseria Gonorrhea: NEGATIVE

## 2020-09-11 ENCOUNTER — Other Ambulatory Visit (HOSPITAL_COMMUNITY): Payer: Self-pay | Admitting: Internal Medicine

## 2020-09-11 MED FILL — MUPIROCIN 2% OINTMENT: 2 | 6 days supply | Qty: 44 | Fill #0

## 2020-09-11 MED FILL — CEPHALEXIN 500 MG CAPSULE: 500 | 7 days supply | Qty: 28 | Fill #0

## 2020-09-11 MED FILL — DICLOFENAC SODIUM 1 % GEL: 1 | 31 days supply | Qty: 500 | Fill #0

## 2020-09-18 MED FILL — FARXIGA 10 MG TABLET: 10 | 30 days supply | Qty: 30 | Fill #3

## 2020-09-24 ENCOUNTER — Other Ambulatory Visit: Payer: Self-pay

## 2020-09-24 MED ORDER — ENTRESTO 97-103 MG PO TABS: 1.0000 | ORAL_TABLET | Freq: Two times a day (BID) | ORAL | 5 refills | Status: DC

## 2020-09-24 MED FILL — CEFIXIME 400 MG CAPS: 400 | 1 days supply | Qty: 2 | Fill #0

## 2020-09-24 MED FILL — ZOLPIDEM TARTRATE 10 MG TAB: 10 | 30 days supply | Qty: 15 | Fill #3

## 2020-10-01 ENCOUNTER — Ambulatory Visit: Payer: Medicaid Other | Admitting: Cardiovascular Disease

## 2020-10-09 ENCOUNTER — Other Ambulatory Visit (HOSPITAL_COMMUNITY): Payer: Self-pay | Admitting: Internal Medicine

## 2020-10-09 MED FILL — AMOX-CLAV 875-125 MG TABLET: 875-125 | 10 days supply | Qty: 20 | Fill #0

## 2020-10-09 MED FILL — CLOPIDOGREL 75 MG TABLET: 75 | 90 days supply | Qty: 90 | Fill #0

## 2020-10-09 MED FILL — ACETAMINOPHEN EXTRA STRENGT: 500 | 12 days supply | Qty: 100 | Fill #0

## 2020-10-10 ENCOUNTER — Other Ambulatory Visit (HOSPITAL_COMMUNITY): Payer: Self-pay | Admitting: Internal Medicine

## 2020-10-10 MED FILL — CEPHALEXIN 500 MG CAPSULE: 500 | 7 days supply | Qty: 28 | Fill #0

## 2020-10-11 ENCOUNTER — Other Ambulatory Visit: Payer: Self-pay

## 2020-10-11 ENCOUNTER — Ambulatory Visit (INDEPENDENT_AMBULATORY_CARE_PROVIDER_SITE_OTHER): Payer: Medicaid Other | Admitting: Cardiovascular Disease

## 2020-10-11 ENCOUNTER — Encounter: Payer: Self-pay | Admitting: Cardiovascular Disease

## 2020-10-11 DIAGNOSIS — I10 Essential (primary) hypertension: Secondary | ICD-10-CM

## 2020-10-11 DIAGNOSIS — E785 Hyperlipidemia, unspecified: Secondary | ICD-10-CM | POA: Diagnosis not present

## 2020-10-11 DIAGNOSIS — I2510 Atherosclerotic heart disease of native coronary artery without angina pectoris: Secondary | ICD-10-CM

## 2020-10-11 DIAGNOSIS — I5021 Acute systolic (congestive) heart failure: Secondary | ICD-10-CM

## 2020-10-11 DIAGNOSIS — Z9861 Coronary angioplasty status: Secondary | ICD-10-CM

## 2020-10-11 LAB — LIPID PANEL
Chol/HDL Ratio: 4 ratio (ref 0.0–4.4)
Cholesterol, Total: 199 mg/dL (ref 100–199)
HDL: 50 mg/dL (ref >39)
LDL Chol Calc (NIH): 133 mg/dL — ABNORMAL HIGH (ref 0–99)
Triglycerides: 91 mg/dL (ref 0–149)
VLDL Cholesterol Cal: 16 mg/dL (ref 5–40)

## 2020-10-11 LAB — HEPATIC FUNCTION PANEL
ALT: 14 IU/L (ref 0–32)
AST: 14 IU/L (ref 0–40)
Albumin: 4.3 g/dL (ref 3.8–4.9)
Alkaline Phosphatase: 100 IU/L (ref 44–121)
Bilirubin Total: 0.4 mg/dL (ref 0.0–1.2)
Bilirubin, Direct: 0.1 mg/dL (ref 0.00–0.40)
Total Protein: 7.2 g/dL (ref 6.0–8.5)

## 2020-10-11 MED ORDER — NEBIVOLOL HCL 20 MG PO TABS
20.0000 mg | ORAL_TABLET | Freq: Every day | ORAL | 11 refills | Status: DC
Start: 2020-10-11 — End: 2020-12-16

## 2020-10-11 NOTE — Assessment & Plan Note (Signed)
History of ischemic cardiomyopathy with an EF in the past to 25% which is recently improved up to 40 to 45% by 2D echo 06/19/2020 on Entresto and spironolactone.  Unfortunately she was unable to take Coreg and metoprolol because of headaches and insurance would not cover her Bystolic.

## 2020-10-11 NOTE — Assessment & Plan Note (Signed)
History of essential hypertension a blood pressure measured today at 132/90.  She is on hydralazine and Entresto.

## 2020-10-11 NOTE — Patient Instructions (Addendum)
Medication Instructions:  Restart the Bystolic 20 mg once daily  *If you need a refill on your cardiac medications before your next appointment, please call your pharmacy*   Lab Work: Your provider would like for you to have the following labs today: fasting lipid and liver  If you have labs (blood work) drawn today and your tests are completely normal, you will receive your results only by: Marland Kitchen MyChart Message (if you have MyChart) OR . A paper copy in the mail If you have any lab test that is abnormal or we need to change your treatment, we will call you to review the results.   Testing/Procedures: Your physician has requested that you have an echocardiogram in 12 months. Echocardiography is a painless test that uses sound waves to create images of your heart. It provides your doctor with information about the size and shape of your heart and how well your heart's chambers and valves are working. You may receive an ultrasound enhancing agent through an IV if needed to better visualize your heart during the echo.This procedure takes approximately one hour. There are no restrictions for this procedure. This will take place at the [address].     Follow-Up: At [facility], you and your health needs are our priority.  As part of our continuing mission to provide you with exceptional heart care, we have created designated Provider Care Teams.  These Care Teams include your primary Cardiologist (physician) and Advanced Practice Providers (APPs -  Physician Assistants and Nurse Practitioners) who all work together to provide you with the care you need, when you need it.  We recommend signing up for the patient portal called "MyChart".  Sign up information is provided on this After Visit Summary.  MyChart is used to connect with patients for Virtual Visits (Telemedicine).  Patients are able to view lab/test results, encounter notes, upcoming appointments, etc.  Non-urgent messages  can be sent to your provider as well.   To learn more about what you can do with MyChart, go to [email or web address].    Your next appointment:   12 month(s)  The format for your next appointment:   In Person  Provider:   You may see Quay Burow, MD or one of the following Advanced Practice Providers on your designated Care Team:    Kerin Ransom, PA-C  [place], [place]  Coletta Memos, [place]

## 2020-10-11 NOTE — Progress Notes (Signed)
10/11/2020 Tracey Manning   April 29, 1961  [id number]  Primary Physician Nolene Ebbs, MD Primary Cardiologist: [name] MD [name], [place]  HPI:  Tracey Manning is a 59 y.o.  moderately overweight single [place] American female mother of 2 children, grandmother and 2 grandchildren who currently is out of work on [employer] and disability. I last saw her in the office11/01/2019.She was referred by Dr. Shawna Orleans for cardiovascular evaluation because of chest pain. Her cardiac risk factor profile is positive for 20-pack-years of tobacco abuse currently smoking one pack per day, treated type of pressure and high cholesterol. She has had multiple heart attacks in the past dating back to 2004. She had a stent placed in her circumflex by Dr. Einar Gip December 2008 (Taxus 2.5 x 24 mm). She had a Medtronic stent placed in her proximal LAD by Dr. Claiborne Billings 06/11/11. Over the last 6 months she has developed recurrent chest pain as well as dyspnea on exertion. Since I saw her on 04/04/14 I obtained a Myoview stress test which was normal and a 2-D echo which revealed normal LV function. Since I saw hera year ago,she's done well clinically. Shegets occasional chest pain which has not changed in frequency or severity.Her blood pressure is somewhat elevated due to back and abdominal pain however.  She was admitted in July with chest pain and shortness of breath.  Cardiac cath performed Dr. Tamala Julian revealed an occluded RCA with left-to-right collaterals, moderate AV groove circumflex disease, patent proximal LAD stent with high-grade disease just proximal to this.  After she was evaluated by cardiothoracic surgery and turned down for bypass grafting I performed ostial/proximal LAD intervention 06/19/2019 with a 3.5 mm x 12 mm long Synergy drug-eluting stent.  Since I saw her a year ago she is done well.  Recent 2D echo performed 06/11/2020 revealed increase in LVEF up to 40 to 45%.  Unfortunately she  could not tolerate metoprolol or carvedilol.  She was placed on Bystolic but apparently insurance would not cover this.   Current Meds  Medication Sig  . albuterol (PROVENTIL HFA;VENTOLIN HFA) 108 (90 BASE) MCG/ACT inhaler Inhale 2 puffs into the lungs every 6 (six) hours as needed for wheezing or shortness of breath.  Marland Kitchen albuterol (PROVENTIL) (2.5 MG/3ML) 0.083% nebulizer solution Take 6 mLs by nebulization every 6 (six) hours as needed for wheezing or shortness of breath.  Marland Kitchen aspirin EC 81 MG EC tablet Take 1 tablet (81 mg total) by mouth daily.  Marland Kitchen atorvastatin (LIPITOR) 40 MG tablet Take 40 mg by mouth daily.  . clopidogrel (PLAVIX) 75 MG tablet Take 1 tablet (75 mg total) by mouth daily with breakfast.  . clotrimazole-betamethasone (LOTRISONE) cream Apply 1 application topically 2 (two) times daily. Use for a maximum of up to 14 days per treatment course.  . cyclobenzaprine (FLEXERIL) 10 MG tablet Take 1 tablet (10 mg total) by mouth 2 (two) times daily as needed for muscle spasms.  . dapagliflozin propanediol (FARXIGA) 10 MG TABS tablet Take 1 tablet (10 mg total) by mouth daily before breakfast.  . digoxin (LANOXIN) 0.125 MG tablet Take 1 tablet (0.125 mg total) by mouth daily.  . fluconazole (DIFLUCAN) 200 MG tablet Take 1 tablet (200 mg total) by mouth every 3 (three) days.  . fluticasone-salmeterol (ADVAIR HFA) 115-21 MCG/ACT inhaler Inhale 2 puffs into the lungs as needed.  . furosemide (LASIX) 40 MG tablet Take 1 tablet (40 mg total) by mouth daily.  . isosorbide mononitrate (IMDUR) 30  MG 24 hr tablet Take 1 tablet (30 mg total) by mouth daily.  Marland Kitchen NARCAN 4 MG/0.1ML LIQD nasal spray kit Place 1 spray into the nose once.   . nitroGLYCERIN (NITROSTAT) 0.4 MG SL tablet Place 1 tablet (0.4 mg total) under the tongue every 5 (five) minutes as needed for chest pain.  . potassium chloride (KLOR-CON) 8 MEQ tablet Take 10 mEq by mouth 2 (two) times daily.  . sacubitril-valsartan (ENTRESTO) 97-103  MG Take 1 tablet by mouth 2 (two) times daily.  Marland Kitchen spironolactone (ALDACTONE) 25 MG tablet Take 1 tablet (25 mg total) by mouth daily.  . VOLTAREN 1 % GEL SMARTSIG:4 Gram(s) Topical 4 Times Daily PRN  . zolpidem (AMBIEN) 10 MG tablet Take 10 mg by mouth at bedtime as needed for sleep.   Current Facility-Administered Medications for the 10/11/20 encounter (Office Visit) with [name], MD  Medication  . cefTRIAXone (ROCEPHIN) injection 500 mg     Allergies  Allergen Reactions  . Coreg [Carvedilol] Other (See Comments)    Headache  . Other Anaphylaxis and Hives    Fresh strawberries (throat swelled)  . Amoxicillin Nausea And Vomiting    Did it involve swelling of the face/tongue/throat, SOB, or low BP? Yes Did it involve sudden or severe rash/hives, skin peeling, or any reaction on the inside of your mouth or nose? Yes Did you need to seek medical attention at a hospital or doctor's office? Yes When did it last happen?within last 10 years If all above answers are "NO", may proceed with cephalosporin use.   . Dilaudid [Hydromorphone Hcl] Nausea And Vomiting  . Ibuprofen Other (See Comments)    wheezing  . Latex Swelling    No reaction with bandaids  . Metoprolol     HEADACHE and Wheezing    Social History   Socioeconomic History  . Marital status: Single    Spouse name: Not on file  . Number of children: 2  . Years of education: 15  . Highest education level: High school graduate  Occupational History  . Not on file  Tobacco Use  . Smoking status: Current Every Day Smoker    Packs/day: 0.50    Years: 34.00    Pack years: 17.00    Types: Cigarettes  . Smokeless tobacco: Never Used  Vaping Use  . Vaping Use: Never used  Substance and Sexual Activity  . Alcohol use: No    Alcohol/week: 0.0 standard drinks    Comment: seldom  . Drug use: Yes    Types: Marijuana    Comment: 06/19/19  . Sexual activity: Yes    Partners: Male    Birth control/protection:  Post-menopausal  Other Topics Concern  . Not on file  Social History Narrative  . Not on file   Social Determinants of Health   Financial Resource Strain:   . Difficulty of Paying Living Expenses: Not on file  Food Insecurity:   . Worried About Charity fundraiser in the Last Year: Not on file  . Ran Out of Food in the Last Year: Not on file  Transportation Needs:   . Lack of Transportation (Medical): Not on file  . Lack of Transportation (Non-Medical): Not on file  Physical Activity:   . Days of Exercise per Week: Not on file  . Minutes of Exercise per Session: Not on file  Stress:   . Feeling of Stress : Not on file  Social Connections:   . Frequency of Communication with Friends  and Family: Not on file  . Frequency of Social Gatherings with Friends and Family: Not on file  . Attends Religious Services: Not on file  . Active Member of Clubs or Organizations: Not on file  . Attends Archivist Meetings: Not on file  . Marital Status: Not on file  Intimate Partner Violence:   . Fear of Current or Ex-Partner: Not on file  . Emotionally Abused: Not on file  . Physically Abused: Not on file  . Sexually Abused: Not on file     Review of Systems: General: negative for chills, fever, night sweats or weight changes.  Cardiovascular: negative for chest pain, dyspnea on exertion, edema, orthopnea, palpitations, paroxysmal nocturnal dyspnea or shortness of breath Dermatological: negative for rash Respiratory: negative for cough or wheezing Urologic: negative for hematuria Abdominal: negative for nausea, vomiting, diarrhea, bright red blood per rectum, melena, or hematemesis Neurologic: negative for visual changes, syncope, or dizziness All other systems reviewed and are otherwise negative except as noted above.    Blood pressure 132/90, pulse 92, height 5' 1"  (1.549 m), weight 197 lb (89.4 kg).  General appearance: alert and no distress Neck: no adenopathy, no carotid  bruit, no JVD, supple, symmetrical, trachea midline and thyroid not enlarged, symmetric, no tenderness/mass/nodules Lungs: clear to auscultation bilaterally Heart: regular rate and rhythm, S1, S2 normal, no murmur, click, rub or gallop Extremities: extremities normal, atraumatic, no cyanosis or edema Pulses: 2+ and symmetric Skin: Skin color, texture, turgor normal. No rashes or lesions Neurologic: Alert and oriented X 3, normal strength and tone. Normal symmetric reflexes. Normal coordination and gait  EKG sinus rhythm at 92 without ST or T wave changes.  Personally reviewed this EKG.  ASSESSMENT AND PLAN:   Essential hypertension History of essential hypertension a blood pressure measured today at 132/90.  She is on hydralazine and Entresto.  Dyslipidemia, goal LDL below 70 History of dyslipidemia supposedly on high-dose atorvastatin.  Her last lipid liver profile performed 06/14/2019 revealed total cholesterol 220, LDL of 131 and HDL 57.  We will recheck a lipid liver profile today  CAD S/P multiple PCIs History of CAD status post stenting by Dr. Einar Gip in 2008 of her circumflex.  She had a Medtronic stent placed in the proximal LAD by Dr. Claiborne Billings 06/11/2011.  She underwent cardiac catheterization by Dr. Tamala Julian 06/15/2019 revealing occluded RCA with left-to-right collaterals, moderate AV groove circumflex stenosis, proximal LAD stent which was patent with no high-grade disease just proximal to this at the origin of the LAD.  I performed PCI drug-eluting stenting of the ostial/proximal LAD with a 3.5 x 12 mm long Synergy drug-eluting stent.  She has done well since.  She continues on aspirin and Plavix.  She denies chest pain or shortness of breath.  Acute systolic CHF (congestive heart failure) (HCC) History of ischemic cardiomyopathy with an EF in the past to 25% which is recently improved up to 40 to 45% by 2D echo 06/19/2020 on Entresto and spironolactone.  Unfortunately she was unable to take  Coreg and metoprolol because of headaches and insurance would not cover her Bystolic.      [name] MD FACP,FACC,FAHA, [facility] 10/11/2020 11:07 AM

## 2020-10-11 NOTE — Assessment & Plan Note (Signed)
History of dyslipidemia supposedly on high-dose atorvastatin.  Her last lipid liver profile performed 06/14/2019 revealed total cholesterol 220, LDL of 131 and HDL 57.  We will recheck a lipid liver profile today

## 2020-10-11 NOTE — Assessment & Plan Note (Signed)
History of CAD status post stenting by Dr. Einar Gip in 2008 of her circumflex.  She had a Medtronic stent placed in the proximal LAD by Dr. Claiborne Billings 06/11/2011.  She underwent cardiac catheterization by Dr. Tamala Julian 06/15/2019 revealing occluded RCA with left-to-right collaterals, moderate AV groove circumflex stenosis, proximal LAD stent which was patent with no high-grade disease just proximal to this at the origin of the LAD.  I performed PCI drug-eluting stenting of the ostial/proximal LAD with a 3.5 x 12 mm long Synergy drug-eluting stent.  She has done well since.  She continues on aspirin and Plavix.  She denies chest pain or shortness of breath.

## 2020-10-21 ENCOUNTER — Other Ambulatory Visit: Payer: Self-pay

## 2020-10-21 ENCOUNTER — Ambulatory Visit (HOSPITAL_COMMUNITY)
Admission: RE | Admit: 2020-10-21 | Discharge: 2020-10-21 | Disposition: A | Discharge status: Home / Self Care | Payer: Medicaid Other | Source: Ambulatory Visit | Attending: Internal Medicine | Admitting: Internal Medicine

## 2020-10-21 ENCOUNTER — Encounter (HOSPITAL_COMMUNITY): Payer: Self-pay

## 2020-10-21 ENCOUNTER — Other Ambulatory Visit (HOSPITAL_COMMUNITY): Payer: Self-pay | Admitting: Internal Medicine

## 2020-10-21 ENCOUNTER — Ambulatory Visit (HOSPITAL_COMMUNITY)
Admission: RE | Admit: 2020-10-21 | Discharge: 2020-10-21 | Disposition: A | Discharge status: Home / Self Care | Payer: Medicaid Other | Source: Ambulatory Visit | Attending: Adult Health | Admitting: Adult Health

## 2020-10-21 VITALS — BP 140/100 | HR 83 | Wt 194.2 lb

## 2020-10-21 DIAGNOSIS — F17210 Nicotine dependence, cigarettes, uncomplicated: Secondary | ICD-10-CM | POA: Insufficient documentation

## 2020-10-21 DIAGNOSIS — J449 Chronic obstructive pulmonary disease, unspecified: Secondary | ICD-10-CM | POA: Insufficient documentation

## 2020-10-21 DIAGNOSIS — I25110 Atherosclerotic heart disease of native coronary artery with unstable angina pectoris: Secondary | ICD-10-CM | POA: Insufficient documentation

## 2020-10-21 DIAGNOSIS — I5022 Chronic systolic (congestive) heart failure: Secondary | ICD-10-CM | POA: Diagnosis not present

## 2020-10-21 DIAGNOSIS — Z7982 Long term (current) use of aspirin: Secondary | ICD-10-CM | POA: Diagnosis not present

## 2020-10-21 DIAGNOSIS — R002 Palpitations: Secondary | ICD-10-CM

## 2020-10-21 DIAGNOSIS — Z8249 Family history of ischemic heart disease and other diseases of the circulatory system: Secondary | ICD-10-CM | POA: Diagnosis not present

## 2020-10-21 DIAGNOSIS — Z79899 Other long term (current) drug therapy: Secondary | ICD-10-CM | POA: Insufficient documentation

## 2020-10-21 DIAGNOSIS — G4733 Obstructive sleep apnea (adult) (pediatric): Secondary | ICD-10-CM | POA: Diagnosis not present

## 2020-10-21 DIAGNOSIS — I110 Hypertensive heart disease with heart failure: Secondary | ICD-10-CM | POA: Diagnosis not present

## 2020-10-21 DIAGNOSIS — Z955 Presence of coronary angioplasty implant and graft: Secondary | ICD-10-CM | POA: Diagnosis not present

## 2020-10-21 DIAGNOSIS — Z7902 Long term (current) use of antithrombotics/antiplatelets: Secondary | ICD-10-CM | POA: Diagnosis not present

## 2020-10-21 DIAGNOSIS — Z7984 Long term (current) use of oral hypoglycemic drugs: Secondary | ICD-10-CM | POA: Diagnosis not present

## 2020-10-21 DIAGNOSIS — I10 Essential (primary) hypertension: Secondary | ICD-10-CM

## 2020-10-21 DIAGNOSIS — R0602 Shortness of breath: Secondary | ICD-10-CM | POA: Diagnosis present

## 2020-10-21 DIAGNOSIS — R7303 Prediabetes: Secondary | ICD-10-CM | POA: Insufficient documentation

## 2020-10-21 DIAGNOSIS — Z720 Tobacco use: Secondary | ICD-10-CM

## 2020-10-21 LAB — BASIC METABOLIC PANEL
Anion gap: 15 (ref 5–15)
BUN: 10 mg/dL (ref 6–20)
CO2: 23 mmol/L (ref 22–32)
Calcium: 9.6 mg/dL (ref 8.9–10.3)
Chloride: 101 mmol/L (ref 98–111)
Creatinine, Ser: 1.31 mg/dL — ABNORMAL HIGH (ref 0.44–1.00)
GFR, Estimated: 47 mL/min — ABNORMAL LOW (ref >60)
Glucose, Bld: 92 mg/dL (ref 70–99)
Potassium: 3 mmol/L — ABNORMAL LOW (ref 3.5–5.1)
Sodium: 139 mmol/L (ref 135–145)

## 2020-10-21 MED ORDER — HYDRALAZINE HCL 25 MG PO TABS: 37.5000 mg | ORAL_TABLET | Freq: Three times a day (TID) | ORAL | 3 refills | Status: DC

## 2020-10-21 MED ORDER — SPIRONOLACTONE 25 MG PO TABS: 25.0000 mg | ORAL_TABLET | Freq: Every day | ORAL | 3 refills | Status: DC

## 2020-10-21 MED ORDER — DAPAGLIFLOZIN PROPANEDIOL 10 MG PO TABS: 10.0000 mg | ORAL_TABLET | Freq: Every day | ORAL | 11 refills | Status: DC

## 2020-10-21 MED ORDER — ENTRESTO 97-103 MG PO TABS
1.0000 | ORAL_TABLET | Freq: Two times a day (BID) | ORAL | 5 refills | Status: DC
  Filled 2021-03-01 – 2021-03-06 (×2): qty 60, 30d supply, fill #0

## 2020-10-21 MED ORDER — FUROSEMIDE 40 MG PO TABS: 40.0000 mg | ORAL_TABLET | Freq: Every day | ORAL | 0 refills | Status: DC

## 2020-10-21 MED FILL — FUROSEMIDE 40 MG TAB: 40 | 60 days supply | Qty: 60 | Fill #0

## 2020-10-21 MED FILL — FARXIGA 10 MG TABLET: 10 | 30 days supply | Qty: 30 | Fill #0

## 2020-10-21 MED FILL — SPIRONOLACTONE 25 MG TABS: 25 | 30 days supply | Qty: 30 | Fill #0

## 2020-10-21 MED FILL — HYDRALAZINE HCL 25 MG TABS: 25 | 30 days supply | Qty: 135 | Fill #0

## 2020-10-21 NOTE — Addendum Note (Signed)
Encounter addended by: Malena Edman, RN on: 10/21/2020 12:09 PM  Actions taken: Clinical Note Signed

## 2020-10-21 NOTE — Progress Notes (Signed)
ADVANCED HF CLINIC PROGRESS NOTE  Primary Care: Dr. Tacy Dura  Primary Cardiologist: Dr. Gwenlyn Found [facility]: Dr. Haroldine Laws   HPI: Tracey Manning is 59 y/o woman with morbid obesity, CAD s/p previous PCI, COPD with ongoing tobacco use, OSA (intolerant of CPAP), borderline DM2, HTN, HL and systolic HF due to iCM, recently referrred by Dr. Gwenlyn Found to [facility] for further HF evaluation.   She had a remote PCI in 2004 in [place]. In 2008 she had a circumflex PCI by Dr. Claiborne Billings. She had known occluded RCA with left-to-right collaterals. In 2012 she had an LAD stent placedby Dr. Claiborne Billings.  She was admitted 06/11/2019 with acute respiratory failure and unstable angina. She had new LV dysfunction with an EF of 25%. She had acute kidney injury as well with a creatinine of 1.8. the patient was admitted, diuresed, and underwent diagnostic catheterization 06/15/2019. This revealed a 60 to 70% mid circumflex stenosis, known occluded RCA with left-to-right collaterals, and a 95% proximal LAD before the previously placed stent which was 50 to 60% narrowed. After review the patient was turned down for CABG and she underwent intervention to her LAD on 06/19/2019 by Dr. Gwenlyn Found.  She had a f/u echo 10/20 which revealed her EF to be unchanged at 25-30%.  Today she returns for HF follow up.Last visit hydralazine was added. Overall feeling ok. Says she is stressed out of the person transporting her to the clinic.  Complaining of palpitations. SOB with exertion. Denies PND/Orthopnea. Walking 2 blocks per day.  Appetite ok. No fever or chills. She has not been weighing at home.  Taking all medications. Smoking 1/2 PPD. Using rescue inhaler daily.   Echo 06/19/20 EF 40-45% RV ok Personally reviewed   Past Medical History:  Diagnosis Date   Anxiety    Arthritis    Asthma    Barrett's esophagus    Chest pain    Collagen vascular disease ([place])    Colon polyps    Coronary artery disease    Depression     Dyslipidemia    GERD (gastroesophageal reflux disease)    occ   Hyperlipidemia    Hypertension    Myocardial infarct [facility]) [id number]   Sciatica    Sleep apnea    no CPAP ordered but using oxygen at bedtime   Tobacco abuse     Current Outpatient Medications  Medication Sig Dispense Refill   albuterol (PROVENTIL HFA;VENTOLIN HFA) 108 (90 BASE) MCG/ACT inhaler Inhale 2 puffs into the lungs every 6 (six) hours as needed for wheezing or shortness of breath.     albuterol (PROVENTIL) (2.5 MG/3ML) 0.083% nebulizer solution Take 6 mLs by nebulization every 6 (six) hours as needed for wheezing or shortness of breath.     aspirin EC 81 MG EC tablet Take 1 tablet (81 mg total) by mouth daily. 60 tablet 1   atorvastatin (LIPITOR) 40 MG tablet Take 40 mg by mouth daily.     clopidogrel (PLAVIX) 75 MG tablet Take 1 tablet (75 mg total) by mouth daily with breakfast. 60 tablet 1   clotrimazole-betamethasone (LOTRISONE) cream Apply 1 application topically 2 (two) times daily. Use for a maximum of up to 14 days per treatment course. 45 g 0   cyclobenzaprine (FLEXERIL) 10 MG tablet Take 1 tablet (10 mg total) by mouth 2 (two) times daily as needed for muscle spasms. 20 tablet 0   dapagliflozin propanediol (FARXIGA) 10 MG TABS tablet Take 1 tablet (10 mg total) by mouth daily before  breakfast. 30 tablet 11   digoxin (LANOXIN) 0.125 MG tablet Take 1 tablet (0.125 mg total) by mouth daily. 30 tablet 6   fluconazole (DIFLUCAN) 200 MG tablet Take 1 tablet (200 mg total) by mouth every 3 (three) days. 3 tablet 2   fluticasone-salmeterol (ADVAIR HFA) 115-21 MCG/ACT inhaler Inhale 2 puffs into the lungs as needed.     furosemide (LASIX) 40 MG tablet Take 1 tablet (40 mg total) by mouth daily. 60 tablet 0   hydrALAZINE (APRESOLINE) 25 MG tablet Take 1 tablet (25 mg total) by mouth 3 (three) times daily. 270 tablet 3   isosorbide mononitrate (IMDUR) 30 MG 24 hr tablet Take 1 tablet (30 mg total)  by mouth daily. 30 tablet 1   NARCAN 4 MG/0.1ML LIQD nasal spray kit Place 1 spray into the nose once.      nitroGLYCERIN (NITROSTAT) 0.4 MG SL tablet Place 1 tablet (0.4 mg total) under the tongue every 5 (five) minutes as needed for chest pain. 25 tablet 6   potassium chloride (KLOR-CON) 8 MEQ tablet Take 10 mEq by mouth 2 (two) times daily.     sacubitril-valsartan (ENTRESTO) 97-103 MG Take 1 tablet by mouth 2 (two) times daily. 60 tablet 5   spironolactone (ALDACTONE) 25 MG tablet Take 1 tablet (25 mg total) by mouth daily. 30 tablet 3   VOLTAREN 1 % GEL SMARTSIG:4 Gram(s) Topical 4 Times Daily PRN     zolpidem (AMBIEN) 10 MG tablet Take 10 mg by mouth at bedtime as needed for sleep.     Nebivolol HCl (BYSTOLIC) 20 MG TABS Take 1 tablet (20 mg total) by mouth daily. (Patient not taking: Reported on 10/21/2020) 30 tablet 11   Current Facility-Administered Medications  Medication Dose Route Frequency Provider Last Rate Last Admin   cefTRIAXone (ROCEPHIN) injection 500 mg  500 mg Intramuscular Once [name], MD        Allergies  Allergen Reactions   Coreg [Carvedilol] Other (See Comments)    Headache   Other Anaphylaxis and Hives    Fresh strawberries (throat swelled)   Amoxicillin Nausea And Vomiting    Did it involve swelling of the face/tongue/throat, SOB, or low BP? Yes Did it involve sudden or severe rash/hives, skin peeling, or any reaction on the inside of your mouth or nose? Yes Did you need to seek medical attention at a hospital or doctor's office? Yes When did it last happen?within last 10 years If all above answers are NO, may proceed with cephalosporin use.    Dilaudid [Hydromorphone Hcl] Nausea And Vomiting   Ibuprofen Other (See Comments)    wheezing   Latex Swelling    No reaction with bandaids   Metoprolol     HEADACHE and Wheezing      Social History   Socioeconomic History   Marital status: Single    Spouse name: Not on  file   Number of children: 2   Years of education: 12   Highest education level: High school graduate  Occupational History   Not on file  Tobacco Use   Smoking status: Current Every Day Smoker    Packs/day: 0.50    Years: 34.00    Pack years: 17.00    Types: Cigarettes   Smokeless tobacco: Never Used  Vaping Use   Vaping Use: Never used  Substance and Sexual Activity   Alcohol use: No    Alcohol/week: 0.0 standard drinks    Comment: seldom   Drug use: Yes  Types: Marijuana    Comment: 06/19/19   Sexual activity: Yes    Partners: Male    Birth control/protection: Post-menopausal  Other Topics Concern   Not on file  Social History Narrative   Not on file   Social Determinants of Health   Financial Resource Strain:    Difficulty of Paying Living Expenses: Not on file  Food Insecurity:    Worried About Charity fundraiser in the Last Year: Not on file   [employer] of Food in the Last Year: Not on file  Transportation Needs:    Lack of Transportation (Medical): Not on file   Lack of Transportation (Non-Medical): Not on file  Physical Activity:    Days of Exercise per Week: Not on file   Minutes of Exercise per Session: Not on file  Stress:    Feeling of Stress : Not on file  Social Connections:    Frequency of Communication with Friends and Family: Not on file   Frequency of Social Gatherings with Friends and Family: Not on file   Attends Religious Services: Not on file   Active Member of Clubs or Organizations: Not on file   Attends Archivist Meetings: Not on file   Marital Status: Not on file  Intimate Partner Violence:    Fear of Current or Ex-Partner: Not on file   Emotionally Abused: Not on file   Physically Abused: Not on file   Sexually Abused: Not on file      Family History  Problem Relation Age of Onset   Leukemia Mother    Clotting disorder Father        blood clot   Hypertension Sister    Diabetes  Sister    Stroke Sister 40   Bleeding Disorder Son        ITP "free bleeding disorder"   Colon cancer Paternal Grandmother    Diabetes Paternal Grandmother    Stomach cancer Neg Hx    Pancreatic cancer Neg Hx     Vitals:   10/21/20 1059  BP: (!) 140/100  Pulse: 83  SpO2: 99%  Weight: 88.1 kg (194 lb 3.2 oz)    PHYSICAL EXAM: General:  Well appearing. No resp difficulty. Walked out of the clinic HEENT: normal Neck: supple. no JVD. Carotids 2+ bilat; no bruits. No lymphadenopathy or thryomegaly appreciated. Cor: PMI nondisplaced. Regular rate & rhythm. No rubs, gallops or murmurs. Lungs: clear Abdomen: soft, nontender, nondistended. No hepatosplenomegaly. No bruits or masses. Good bowel sounds. Extremities: no cyanosis, clubbing, rash, edema Neuro: alert & orientedx3, cranial nerves grossly intact. moves all 4 extremities w/o difficulty. Affect pleasant  ASSESSMENT & PLAN:  1. Chronic systolic CHF due to iCM - echo 10/20 EF 25-30% - - Echo 05/2020 EF 40-45%  - NYHA [name]. Volume status stable.  - Continue Entresto 97-103 twice daily - Continue spironolactone 25 mg daily -Increase hydralazine to 37.5 mg three times a day + Imdur 30 daily - Continue Farxiga 10  - Stop digoxin > 34% F. - Bystolic not covered by Medicaid. She can not afford. Waiting on PA. Intolerant metoprolol due to headache.    2. CAD S/P multiple PCIs - s/p PCI in [place] - s/p OM PCI '08-Dr Claiborne Billings - s/p LAD PCI/DES 2012 - s/p pLAD PCI with DES 06/19/2019- Dr Gwenlyn Found after patient turned down for CABG. Known occluded RCA with L-R collaterals, 65% mCFX-EF 25% - No chest pain.  - Continue ASA/statin  - manged by Dr.  Berry   3. HTN -Elevated. - Increase hydralazine 37.5 mg three times a day.    4. COPD with ongoing tobacco use - continues to smoke. Discussed smoking cessation.  - encouraged complete cessation  5. OSA: -  Intolerant CPAP.   6. Palpitations  Place Zio Patch.   Refill  all HF medications.   Check BMET.  Follow up in 3 months.   Tracey Mow NP-C   11:16 AM

## 2020-10-21 NOTE — Patient Instructions (Signed)
INCREASE Hydralazine 37.5mg  (1 1/2 tablets) Three times a day  STOP Digoxin  Refills for your medications have been sent to your pharmacy  Labs done today, your results will be available in MyChart, we will contact you for abnormal readings.  Your provider has recommended that  you wear a Zio Patch for 7 days.  This monitor will record your heart rhythm for our review.  IF you have any symptoms while wearing the monitor please press the button.  If you have any issues with the patch or you notice a red or orange light on it please call the company at [phone].  Once you remove the patch please mail it back to the company as soon as possible so we can get the results.  Your physician recommends that you schedule a follow-up appointment in: 2 months with the APP clinic  If you have any questions or concerns before your next appointment please send [place] a message through [place] or call our office at [phone].    TO LEAVE A MESSAGE FOR THE NURSE SELECT OPTION 2, PLEASE LEAVE A MESSAGE INCLUDING: . YOUR NAME . DATE OF BIRTH . CALL BACK NUMBER . REASON FOR CALL**this is important as we prioritize the call backs  YOU WILL RECEIVE A CALL BACK THE SAME DAY AS LONG AS YOU CALL BEFORE 4:00 PM

## 2020-10-21 NOTE — Progress Notes (Signed)
Zio patch placed onto patient.  All instructions and information reviewed with patient, they verbalize understanding with no questions. 

## 2020-10-25 ENCOUNTER — Telehealth (HOSPITAL_COMMUNITY): Payer: Self-pay | Admitting: Cardiology

## 2020-10-25 ENCOUNTER — Other Ambulatory Visit (HOSPITAL_COMMUNITY): Payer: Self-pay | Admitting: Internal Medicine

## 2020-10-25 DIAGNOSIS — I5022 Chronic systolic (congestive) heart failure: Secondary | ICD-10-CM

## 2020-10-25 MED ORDER — POTASSIUM CHLORIDE ER 10 MEQ PO TBCR: 20.0000 meq | EXTENDED_RELEASE_TABLET | Freq: Two times a day (BID) | ORAL | 3 refills | Status: DC

## 2020-10-25 MED FILL — POTASSIUM CL ER 10 MEQ TAB: 10 | 30 days supply | Qty: 120 | Fill #0

## 2020-10-25 NOTE — Telephone Encounter (Signed)
Pt aware and voiced understanding, reports she was out of K for quite sometime Repeat labs 12/10

## 2020-10-25 NOTE — Telephone Encounter (Signed)
-----   Message from Jolaine Artist, MD sent at 10/25/2020 10:30 AM EST ----- Was this potassium addressed?   If not. Give 40kcl now and increase daily dose by 20 daily. Bmet next week

## 2020-11-04 ENCOUNTER — Ambulatory Visit (HOSPITAL_COMMUNITY)
Admission: RE | Admit: 2020-11-04 | Discharge: 2020-11-04 | Disposition: A | Discharge status: Home / Self Care | Payer: Medicaid Other | Source: Ambulatory Visit | Attending: Internal Medicine | Admitting: Internal Medicine

## 2020-11-04 ENCOUNTER — Other Ambulatory Visit: Payer: Self-pay

## 2020-11-04 DIAGNOSIS — I5022 Chronic systolic (congestive) heart failure: Secondary | ICD-10-CM | POA: Diagnosis not present

## 2020-11-04 LAB — BASIC METABOLIC PANEL
Anion gap: 12 (ref 5–15)
BUN: 16 mg/dL (ref 6–20)
CO2: 23 mmol/L (ref 22–32)
Calcium: 9.6 mg/dL (ref 8.9–10.3)
Chloride: 105 mmol/L (ref 98–111)
Creatinine, Ser: 1.19 mg/dL — ABNORMAL HIGH (ref 0.44–1.00)
GFR, Estimated: 53 mL/min — ABNORMAL LOW (ref >60)
Glucose, Bld: 88 mg/dL (ref 70–99)
Potassium: 4 mmol/L (ref 3.5–5.1)
Sodium: 140 mmol/L (ref 135–145)

## 2020-11-08 MED FILL — BENZONATATE 100 MG CAPS: 100 | 10 days supply | Qty: 30 | Fill #2

## 2020-11-08 MED FILL — ZOLPIDEM TARTRATE 10 MG TAB: 10 | 30 days supply | Qty: 15 | Fill #4

## 2020-11-09 ENCOUNTER — Emergency Department (HOSPITAL_COMMUNITY)
Admission: EM | Admit: 2020-11-09 | Discharge: 2020-11-09 | Disposition: A | Discharge status: Home / Self Care | Payer: Medicaid Other | Attending: Emergency Medicine | Admitting: Emergency Medicine

## 2020-11-09 ENCOUNTER — Encounter (HOSPITAL_COMMUNITY): Payer: Self-pay | Admitting: Emergency Medicine

## 2020-11-09 ENCOUNTER — Emergency Department (HOSPITAL_COMMUNITY): Payer: Medicaid Other

## 2020-11-09 ENCOUNTER — Other Ambulatory Visit (HOSPITAL_COMMUNITY): Payer: Self-pay | Admitting: Emergency Medicine

## 2020-11-09 ENCOUNTER — Other Ambulatory Visit: Payer: Self-pay

## 2020-11-09 DIAGNOSIS — J45901 Unspecified asthma with (acute) exacerbation: Secondary | ICD-10-CM | POA: Diagnosis not present

## 2020-11-09 DIAGNOSIS — I5021 Acute systolic (congestive) heart failure: Secondary | ICD-10-CM | POA: Diagnosis not present

## 2020-11-09 DIAGNOSIS — I2510 Atherosclerotic heart disease of native coronary artery without angina pectoris: Secondary | ICD-10-CM | POA: Diagnosis not present

## 2020-11-09 DIAGNOSIS — F17210 Nicotine dependence, cigarettes, uncomplicated: Secondary | ICD-10-CM | POA: Diagnosis not present

## 2020-11-09 DIAGNOSIS — J449 Chronic obstructive pulmonary disease, unspecified: Secondary | ICD-10-CM | POA: Diagnosis not present

## 2020-11-09 DIAGNOSIS — R000 Tachycardia, unspecified: Secondary | ICD-10-CM | POA: Diagnosis not present

## 2020-11-09 DIAGNOSIS — N183 Chronic kidney disease, stage 3 unspecified: Secondary | ICD-10-CM | POA: Insufficient documentation

## 2020-11-09 DIAGNOSIS — Z20822 Contact with and (suspected) exposure to covid-19: Secondary | ICD-10-CM | POA: Diagnosis not present

## 2020-11-09 DIAGNOSIS — E876 Hypokalemia: Secondary | ICD-10-CM | POA: Insufficient documentation

## 2020-11-09 DIAGNOSIS — Z91040 Latex allergy status: Secondary | ICD-10-CM | POA: Diagnosis not present

## 2020-11-09 DIAGNOSIS — Z79899 Other long term (current) drug therapy: Secondary | ICD-10-CM | POA: Diagnosis not present

## 2020-11-09 DIAGNOSIS — R0602 Shortness of breath: Secondary | ICD-10-CM | POA: Diagnosis present

## 2020-11-09 DIAGNOSIS — Z7982 Long term (current) use of aspirin: Secondary | ICD-10-CM | POA: Insufficient documentation

## 2020-11-09 DIAGNOSIS — I130 Hypertensive heart and chronic kidney disease with heart failure and stage 1 through stage 4 chronic kidney disease, or unspecified chronic kidney disease: Secondary | ICD-10-CM | POA: Insufficient documentation

## 2020-11-09 LAB — CBC
HCT: 37.5 % (ref 36.0–46.0)
Hemoglobin: 12.5 g/dL (ref 12.0–15.0)
MCH: 28.6 pg (ref 26.0–34.0)
MCHC: 33.3 g/dL (ref 30.0–36.0)
MCV: 85.8 fL (ref 80.0–100.0)
Platelets: 156 10*3/uL (ref 150–400)
RBC: 4.37 MIL/uL (ref 3.87–5.11)
RDW: 16.5 % — ABNORMAL HIGH (ref 11.5–15.5)
WBC: 7.4 10*3/uL (ref 4.0–10.5)
nRBC: 0 % (ref 0.0–0.2)

## 2020-11-09 LAB — RESP PANEL BY RT-PCR (FLU A&B, COVID) ARPGX2
Influenza A by PCR: NEGATIVE
Influenza B by PCR: NEGATIVE
SARS Coronavirus 2 by RT PCR: NEGATIVE

## 2020-11-09 LAB — BASIC METABOLIC PANEL
Anion gap: 12 (ref 5–15)
BUN: 15 mg/dL (ref 6–20)
CO2: 25 mmol/L (ref 22–32)
Calcium: 8.9 mg/dL (ref 8.9–10.3)
Chloride: 101 mmol/L (ref 98–111)
Creatinine, Ser: 1.23 mg/dL — ABNORMAL HIGH (ref 0.44–1.00)
GFR, Estimated: 51 mL/min — ABNORMAL LOW (ref >60)
Glucose, Bld: 117 mg/dL — ABNORMAL HIGH (ref 70–99)
Potassium: 2.7 mmol/L — CL (ref 3.5–5.1)
Sodium: 138 mmol/L (ref 135–145)

## 2020-11-09 LAB — BRAIN NATRIURETIC PEPTIDE: B Natriuretic Peptide: 379.4 pg/mL — ABNORMAL HIGH (ref 0.0–100.0)

## 2020-11-09 LAB — TROPONIN I (HIGH SENSITIVITY)
Troponin I (High Sensitivity): 15 ng/L (ref <18)
Troponin I (High Sensitivity): 14 ng/L (ref <18)

## 2020-11-09 MED ORDER — ALBUTEROL (5 MG/ML) CONTINUOUS INHALATION SOLN
INHALATION_SOLUTION | RESPIRATORY_TRACT | Status: AC
  Filled 2020-11-09: qty 20

## 2020-11-09 MED ORDER — ALBUTEROL SULFATE HFA 108 (90 BASE) MCG/ACT IN AERS
4.0000 | INHALATION_SPRAY | Freq: Four times a day (QID) | RESPIRATORY_TRACT | Status: DC
  Administered 2020-11-09: 03:00:00 4 via RESPIRATORY_TRACT
  Filled 2020-11-09: qty 6.7

## 2020-11-09 MED ORDER — ALBUTEROL SULFATE HFA 108 (90 BASE) MCG/ACT IN AERS: 2.0000 | INHALATION_SPRAY | RESPIRATORY_TRACT | 3 refills | Status: DC | PRN

## 2020-11-09 MED ORDER — POTASSIUM CHLORIDE CRYS ER 20 MEQ PO TBCR
40.0000 meq | EXTENDED_RELEASE_TABLET | Freq: Once | ORAL | Status: AC
  Administered 2020-11-09: 03:00:00 40 meq via ORAL
  Filled 2020-11-09: qty 2

## 2020-11-09 MED ORDER — PREDNISONE 20 MG PO TABS: 40.0000 mg | ORAL_TABLET | Freq: Every day | ORAL | 0 refills | Status: DC

## 2020-11-09 MED ORDER — POTASSIUM CHLORIDE 10 MEQ/100ML IV SOLN
10.0000 meq | Freq: Once | INTRAVENOUS | Status: AC
  Administered 2020-11-09: 03:00:00 10 meq via INTRAVENOUS
  Filled 2020-11-09: qty 100

## 2020-11-09 MED ORDER — ALBUTEROL (5 MG/ML) CONTINUOUS INHALATION SOLN
10.0000 mg/h | INHALATION_SOLUTION | RESPIRATORY_TRACT | Status: DC
  Administered 2020-11-09: 04:00:00 10 mg/h via RESPIRATORY_TRACT

## 2020-11-09 MED FILL — predniSONE 20 MG TABS: 20 | 5 days supply | Qty: 10 | Fill #0

## 2020-11-09 NOTE — ED Triage Notes (Signed)
Patient here from home reporting SOB that started today. Atrovent, Albuterol, and 125mg  Solu Medrol given in route. Hx of COPD and heart failure.

## 2020-11-09 NOTE — Discharge Instructions (Addendum)
You potassium level remained low.  This was supplemented in the ER.  Continue with your supplements at home.  Please have your potassium rechecked in 1 week.  Return to the ER for new or worsening symptoms.

## 2020-11-09 NOTE — ED Provider Notes (Signed)
[facility] DEPT Provider Note   CSN: [id number] Arrival date & time: 11/09/20  0036     History Chief Complaint  Patient presents with  . Shortness of Breath    Tracey Manning is a 59 y.o. female.  Patient with past medical history notable for prior MI, asthma, CHF on lasix, presents to the emergency department with a chief complaint of shortness of breath.  She reports having shortness of breath over the past several days.  It has been gradually worsening.  She denies productive cough or fever.  She was given albuterol, Atrovent, and 125 mg of Solu-Medrol by EMS.  She reports some mild improvement in her symptoms.  She denies having chest pain.  She states her shortness of breath is worsened when she lies down.  She denies any lower extremity swelling.  She has been compliant with her home medications.  The history is provided by the patient. No language interpreter was used.       Past Medical History:  Diagnosis Date  . Anxiety   . Arthritis   . Asthma   . Barrett's esophagus   . Chest pain   . Collagen vascular disease ([place])   . Colon polyps   . Coronary artery disease   . Depression   . Dyslipidemia   . GERD (gastroesophageal reflux disease)    occ  . Hyperlipidemia   . Hypertension   . Myocardial infarct ([place]) 2774,1287  . Sciatica   . Sleep apnea    no CPAP ordered but using oxygen at bedtime  . Tobacco abuse     Patient Active Problem List   Diagnosis Date Noted  . Impingement syndrome of left shoulder 03/12/2020  . Chronic pain 07/18/2019  . CRI (chronic renal insufficiency), stage 3 (moderate) 07/18/2019  . COPD (chronic obstructive pulmonary disease) ([place]) 06/28/2019  . Obesity (BMI 30-39.9) 06/28/2019  . Ischemic cardiomyopathy 06/28/2019  . Dyspnea on exertion 06/15/2019  . Acute systolic CHF (congestive heart failure) ([place])   . AKI (acute kidney injury) ([place])   . Abnormal uterine bleeding (AUB) 04/03/2016  .  Dysfunctional uterine bleeding 11/14/2015  . Bacterial vaginosis (recurrent) 11/14/2015  . Chest pain 11/14/2015  . Essential hypertension 04/04/2014  . Dyslipidemia, goal LDL below 70 04/04/2014  . CAD S/P multiple PCIs 04/04/2014    Past Surgical History:  Procedure Laterality Date  . CARDIAC CATHETERIZATION  10/09/2003   [facility] [place] [place]) - LAD with 30% prox narrowing, 50% stenosis in mid-portion of PLA; RCA with 40% narrowing proximally ([name], [name])  . CARDIAC CATHETERIZATION  10/10/2007   70% stenosis in first septal perforator branch of LAD, 60-70% narrowing in mid LAD, 20% narrowing in mid AV groove Cfx with 80% diffuse narrowing in small distal marginal, total occlusion of mid RCA with L to R collaterals, 90% stenosis diffusely in prox branch of RCA followed by 70% stenosis in secondary curve & 80% stenosis in small marginal branch ([name])  . CARDIAC CATHETERIZATION  05/28/2008   normal L main, RCA with 100% prox lesion w/distal filling from LAD collaterals, LAD with 20% prox tubular lesion/ 40% mid LAD lesion/previous stent patent ([name])  . CARDIAC CATHETERIZATION  09/15/2009   discrete 100% osital RCA lesion, 50% prox LAD lesion, non-obstructive disease in all coronaries ([name])  . CESAREAN SECTION    . COLONOSCOPY    . COLONOSCOPY W/ POLYPECTOMY    . CORONARY ANGIOPLASTY WITH STENT PLACEMENT  10/31/2007   PCI of distal Cfx with 2.25x10m Taxus Adam DES, 60% narrowing of mid LAD (Dr. TCorky Downs  . CORONARY ANGIOPLASTY WITH STENT PLACEMENT  06/11/2011   PCI of prox-mid LAD with 3x122mDES Resolute (Dr. T.[name] . CORONARY STENT INTERVENTION N/A 06/19/2019   Procedure: CORONARY STENT INTERVENTION;  Surgeon: BeLorretta HarpMD;  Location: [facility];  Service: Cardiovascular;  Laterality: N/A;  LAD  . DILATION AND CURETTAGE, DIAGNOSTIC / THERAPEUTIC    . DILITATION & CURRETTAGE/HYSTROSCOPY WITH HYDROTHERMAL ABLATION N/A  04/03/2016   Procedure: DILATATION & CURETTAGE/HYSTEROSCOPY WITH HYDROTHERMAL ABLATION;  Surgeon: ChShelly BombardMD;  [facility];  Service: Gynecology;  Laterality: N/A;  . NM MYOCAR PERF WALL MOTION  08/2009   persantine myoview - normal perfusion in all regions, perfusion defect in anterior region (breast attenuation), EF 52%, low risk scan  . RIGHT/LEFT HEART CATH AND CORONARY ANGIOGRAPHY N/A 06/15/2019   Procedure: RIGHT/LEFT HEART CATH AND CORONARY ANGIOGRAPHY;  Surgeon: SmBelva CromeMD;  Location[facility];  Service: Cardiovascular;  Laterality: N/A;     OB History    Gravida  8   Para      Term      Preterm      AB  6   Living  2     SAB  6   IAB      Ectopic      Multiple      Live Births  2           Family History  Problem Relation Age of Onset  . Leukemia Mother   . Clotting disorder Father        blood clot  . Hypertension Sister   . Diabetes Sister   . Stroke Sister 4018. Bleeding Disorder Son        ITP "free bleeding disorder"  . Colon cancer Paternal Grandmother   . Diabetes Paternal Grandmother   . Stomach cancer Neg Hx   . Pancreatic cancer Neg Hx     Social History   Tobacco Use  . Smoking status: Current Every Day Smoker    Packs/day: 0.50    Years: 34.00    Pack years: 17.00    Types: Cigarettes  . Smokeless tobacco: Never Used  Vaping Use  . Vaping Use: Never used  Substance Use Topics  . Alcohol use: No    Alcohol/week: 0.0 standard drinks    Comment: seldom  . Drug use: Yes    Types: Marijuana    Comment: 06/19/19    Home Medications Prior to Admission medications   Medication Sig Start Date End Date Taking? Authorizing Provider  albuterol (PROVENTIL HFA;VENTOLIN HFA) 108 (90 BASE) MCG/ACT inhaler Inhale 2 puffs into the lungs every 6 (six) hours as needed for wheezing or shortness of breath.    [provider]  albuterol (PROVENTIL) (2.5 MG/3ML) 0.083% nebulizer solution Take 6 mLs by  nebulization every 6 (six) hours as needed for wheezing or shortness of breath. 03/27/19   [provider]  aspirin EC 81 MG EC tablet Take 1 tablet (81 mg total) by mouth daily. 06/21/19   HaKayleen MemosDO  atorvastatin (LIPITOR) 40 MG tablet Take 40 mg by mouth daily.    [provider]  clopidogrel (PLAVIX) 75 MG tablet Take 1 tablet (75 mg total) by mouth daily with breakfast. 06/20/19   HaKayleen MemosDO  clotrimazole-betamethasone (LOTRISONE) cream Apply  1 application topically 2 (two) times daily. Use for a maximum of up to 14 days per treatment course. 08/05/20   [name], MD  cyclobenzaprine (FLEXERIL) 10 MG tablet Take 1 tablet (10 mg total) by mouth 2 (two) times daily as needed for muscle spasms. 08/22/20   Domenic Moras, PA-C  dapagliflozin propanediol (FARXIGA) 10 MG TABS tablet Take 1 tablet (10 mg total) by mouth daily before breakfast. 10/21/20   Bensimhon, Shaune Pascal, MD  fluconazole (DIFLUCAN) 200 MG tablet Take 1 tablet (200 mg total) by mouth every 3 (three) days. 08/06/20   [name], MD  fluticasone-salmeterol (ADVAIR HFA) [phone] MCG/ACT inhaler Inhale 2 puffs into the lungs as needed.    [provider]  furosemide (LASIX) 40 MG tablet Take 1 tablet (40 mg total) by mouth daily. 10/21/20   Bensimhon, Shaune Pascal, MD  hydrALAZINE (APRESOLINE) 25 MG tablet Take 1.5 tablets (37.5 mg total) by mouth 3 (three) times daily. 10/21/20   Bensimhon, Shaune Pascal, MD  isosorbide mononitrate (IMDUR) 30 MG 24 hr tablet Take 1 tablet (30 mg total) by mouth daily. 06/21/19   Kayleen Memos, DO  NARCAN 4 MG/0.1ML LIQD nasal spray kit Place 1 spray into the nose once.  09/01/18   [provider]  Nebivolol HCl (BYSTOLIC) 20 MG TABS Take 1 tablet (20 mg total) by mouth daily. Patient not taking: Reported on 10/21/2020 10/11/20   [name], MD  nitroGLYCERIN (NITROSTAT) 0.4 MG SL tablet Place 1 tablet (0.4 mg total) under the tongue every 5 (five)  minutes as needed for chest pain. 01/14/18   [name], MD  potassium chloride (KLOR-CON) 10 MEQ tablet Take 2 tablets (20 mEq total) by mouth 2 (two) times daily. 10/25/20   Bensimhon, Shaune Pascal, MD  sacubitril-valsartan (ENTRESTO) 97-103 MG Take 1 tablet by mouth 2 (two) times daily. 10/21/20   Bensimhon, Shaune Pascal, MD  spironolactone (ALDACTONE) 25 MG tablet Take 1 tablet (25 mg total) by mouth daily. 10/21/20   Bensimhon, Shaune Pascal, MD  VOLTAREN 1 % GEL SMARTSIG:4 Gram(s) Topical 4 Times Daily PRN 09/22/19   [provider]  zolpidem (AMBIEN) 10 MG tablet Take 10 mg by mouth at bedtime as needed for sleep.    [provider]    Allergies    Coreg [carvedilol], Other, Amoxicillin, Dilaudid [hydromorphone hcl], Ibuprofen, Latex, and Metoprolol  Review of Systems   Review of Systems  All other systems reviewed and are negative.   Physical Exam Updated Vital Signs BP (!) 172/99 (BP Location: Right Arm)   Pulse (!) 101   Temp 98.9 F (37.2 C) (Oral)   Resp 16   Ht _0  (1.549 m)   Wt 92.5 kg   LMP  (LMP Unknown)   SpO2 96%   BMI 38.55 kg/m   Physical Exam Vitals and nursing note reviewed.  Constitutional:      General: She is not in acute distress.    Appearance: She is well-developed and well-nourished.  HENT:     Head: Normocephalic and atraumatic.  Eyes:     Conjunctiva/sclera: Conjunctivae normal.  Cardiovascular:     Rate and Rhythm: Regular rhythm. Tachycardia present.     Heart sounds: No murmur heard.   Pulmonary:     Effort: Respiratory distress present.     Breath sounds: Wheezing present.     Comments: Mild increased work of breathing Abdominal:     Palpations: Abdomen is soft.  Tenderness: There is no abdominal tenderness.  Musculoskeletal:        General: No edema.     Cervical back: Neck supple.     Comments: No lower extremity edema  Skin:    General: Skin is warm and dry.  Neurological:     Mental Status: She is alert.   Psychiatric:        Mood and Affect: Mood and affect normal.     ED Results / Procedures / Treatments   Labs (all labs ordered are listed, but only abnormal results are displayed) Labs Reviewed  BASIC METABOLIC PANEL - Abnormal; Notable for the following components:      Result Value   Potassium 2.7 (*)    Glucose, Bld 117 (*)    Creatinine, Ser 1.23 (*)    GFR, Estimated 51 (*)    All other components within normal limits  CBC - Abnormal; Notable for the following components:   RDW 16.5 (*)    All other components within normal limits  RESP PANEL BY RT-PCR (FLU A&B, COVID) ARPGX2  BRAIN NATRIURETIC PEPTIDE  TROPONIN I (HIGH SENSITIVITY)    EKG EKG Interpretation  Date/Time:  Saturday November 09 2020 02:00:42 EST Ventricular Rate:  99 PR Interval:    QRS Duration: 104 QT Interval:  380 QTC Calculation: 488 R Axis:   37 Text Interpretation: Sinus tachycardia Ventricular premature complex Probable left atrial enlargement Consider anterior infarct mac Confirmed by Addison Lank [phone]) on 11/09/2020 2:48:40 AM   Radiology DG Chest Port 1 View  Result Date: 11/09/2020 CLINICAL DATA:  Shortness of breath. EXAM: PORTABLE CHEST 1 VIEW COMPARISON:  June 16, 2019 FINDINGS: The heart size and mediastinal contours are within normal limits. Both lungs are clear. Degenerative changes seen within the thoracic spine. IMPRESSION: No active disease. Electronically Signed   By: Virgina Norfolk M.D.   On: 11/09/2020 02:30    Procedures Procedures (including critical care time)  Medications Ordered in ED Medications  albuterol (VENTOLIN HFA) 108 (90 Base) MCG/ACT inhaler 4 puff (4 puffs Inhalation Given 11/09/20 0246)  potassium chloride 10 mEq in 100 mL IVPB (has no administration in time range)  potassium chloride SA (KLOR-CON) CR tablet 40 mEq (has no administration in time range)    ED Course  I have reviewed the triage vital signs and the nursing notes.  Pertinent labs  & imaging results that were available during my care of the patient were reviewed by me and considered in my medical decision making (see chart for details).    MDM Rules/Calculators/A&P                          This patient complains of shortness of breath, this involves an extensive number of treatment options, and is a complaint that carries with it a high risk of complications and morbidity.    Differential Dx Asthma exacerbation, COPD exacerbation, CHF, Covid, pneumonia  Pertinent Labs I ordered, reviewed, and interpreted labs, which included CBC, BMP, BNP, troponin, Covid, notable for troponin 15->14.  Covid test negative.  BNP 379.  Potassium 2.7.  Imaging Interpretation I ordered imaging studies which included chest x-ray.  I independently visualized and interpreted the chest x-ray, which showed no evidence of fluid overload, no opacities.   Medications I ordered medication potassium for hypokalemia.     Critical Interventions  None  Reassessments After the interventions stated above, I reevaluated the patient and found patient feeling improved.  She is breathing easier.  She ambulates maintaining 93% or greater pulse oxygenation.  She states that she would like to be discharged.  Consultants None  Plan Discharge-I discussed the discharge plan with the patient.  She would like to be discharged.  I will prescribe her some prednisone.  We talked about her low potassium level.  She was given supplemental potassium in the ED this morning.  She will also continue with her regular potassium, and states that her doctor told her to double her potassium for the next few days.  I told her to continue this.  She is advised to return if her symptoms change or worsen.  She understands and agrees with the plan.  Patient discussed with Dr. Leonette Monarch.    Final Clinical Impression(s) / ED Diagnoses Final diagnoses:  Exacerbation of asthma, unspecified asthma severity, unspecified  whether persistent  Hypokalemia    Rx / DC Orders ED Discharge Orders    None       Montine Circle, PA-C 11/09/20 4540    Fatima Blank, MD 11/11/20 1036

## 2020-11-11 MED FILL — PROAIR HFA 90 MCG INHALER: 108 (90 BAS | 17 days supply | Qty: 9 | Fill #0

## 2020-11-19 ENCOUNTER — Other Ambulatory Visit (HOSPITAL_COMMUNITY): Payer: Self-pay | Admitting: Internal Medicine

## 2020-11-19 MED FILL — predniSONE 20 MG TABS: 20 | 7 days supply | Qty: 10 | Fill #0

## 2020-11-29 ENCOUNTER — Other Ambulatory Visit (HOSPITAL_COMMUNITY): Payer: Self-pay | Admitting: Ophthalmology

## 2020-11-29 MED FILL — PREDNISOLONE AC 1% EYE DROP: 1 | 30 days supply | Qty: 5 | Fill #0

## 2020-11-29 MED FILL — OFLOXACIN 0.3% EYE DROPS: 0.3 | 30 days supply | Qty: 5 | Fill #0

## 2020-12-04 ENCOUNTER — Other Ambulatory Visit: Payer: Self-pay | Admitting: Internal Medicine

## 2020-12-04 MED FILL — DOXYCYCLINE HYCLATE 100 MG: 100 | 10 days supply | Qty: 20 | Fill #0

## 2020-12-05 LAB — C. TRACHOMATIS/N. GONORRHOEAE RNA
C. trachomatis RNA, TMA: NOT DETECTED
N. gonorrhoeae RNA, TMA: NOT DETECTED

## 2020-12-06 MED FILL — ZOLPIDEM TARTRATE 10 MG TAB: 10 | 30 days supply | Qty: 15 | Fill #5

## 2020-12-15 NOTE — Progress Notes (Addendum)
ADVANCED HF CLINIC PROGRESS NOTE  Primary Care: Dr. Tacy Dura  Primary Cardiologist: Dr. Gwenlyn Found HF Cardiologist: Dr. Haroldine Laws   HPI: Tracey Manning is 60 y/o woman with morbid obesity, CAD s/p previous PCI, COPD with ongoing tobacco use, OSA (intolerant of CPAP), borderline DM2, HTN, HL and systolic HF due to iCM, recently referrred by Dr. Gwenlyn Found to [facility] for further HF evaluation.   She had a remote PCI in 2004 in [place]. In 2008 she had a circumflex PCI by Dr. Claiborne Billings. She had known occluded RCA with left-to-right collaterals. In 2012 she had an LAD stent placedby Dr. Claiborne Billings.  She was admitted 06/11/2019 with acute respiratory failure and unstable angina. She had new LV dysfunction with an EF of 25%. She had acute kidney injury as well with a creatinine of 1.8. the patient was admitted, diuresed, and underwent diagnostic catheterization 06/15/2019. This revealed a 60 to 70% mid circumflex stenosis, known occluded RCA with left-to-right collaterals, and a 95% proximal LAD before the previously placed stent which was 50 to 60% narrowed. After review the patient was turned down for CABG and she underwent intervention to her LAD on 06/19/2019 by Dr. Gwenlyn Found.  She had a f/u echo 10/20 which revealed her EF to be unchanged at 25-30%.  She returned for HF follow up 11/21. Last visit hydralazine was added. Complaining of palpitations. SOB with exertion. Denies PND/Orthopnea. Walking 2 blocks per day. Smoking 1/2 PPD. Using rescue inhaler daily. Hydralazine was increased to 37.5 tid, digoxin stopped, and Zio patch ordered.  Today she returns for HF follow up. Overall feeling fine. Gets SOB with stress. Still having palpitations. Sister passed away New Year's Day. Can grocery shop pushing cart.  Denies increasing SOB, CP, dizziness, edema, or PND/Orthopnea. Appetite ok. No fever or chills. Weight at home ~204 pounds. Taking all medications. BPs at home ~140/90s.   Zio 12/21:  1. Sinus  rhythm - avg HR of 90 bpm. 2. One 6-beat run NSVT 3. Five runs of SVT - the longest lasting 24.6 secs with an avg rate of 119 bpm. Isolated SVEs were rare (<1.0%) 4. Occasional PVCs (4.0%, [id number]) - however on one day there was 10.5% burden and another ay 8.7% burden.   Echo 06/19/20: EF 40-45% RV ok   Past Medical History:  Diagnosis Date  . Anxiety   . Arthritis   . Asthma   . Barrett's esophagus   . Chest pain   . Collagen vascular disease ([place])   . Colon polyps   . Coronary artery disease   . Depression   . Dyslipidemia   . GERD (gastroesophageal reflux disease)    occ  . Hyperlipidemia   . Hypertension   . Myocardial infarct ([place]) [id number]  . Sciatica   . Sleep apnea    no CPAP ordered but using oxygen at bedtime  . Tobacco abuse     Current Outpatient Medications  Medication Sig Dispense Refill  . albuterol (PROVENTIL HFA;VENTOLIN HFA) 108 (90 BASE) MCG/ACT inhaler Inhale 2 puffs into the lungs every 6 (six) hours as needed for wheezing or shortness of breath.    Marland Kitchen albuterol (PROVENTIL) (2.5 MG/3ML) 0.083% nebulizer solution Take 6 mLs by nebulization every 6 (six) hours as needed for wheezing or shortness of breath.    Marland Kitchen albuterol (VENTOLIN HFA) 108 (90 Base) MCG/ACT inhaler Inhale 2 puffs into the lungs every 4 (four) hours as needed for wheezing or shortness of breath. 1 each 3  . aspirin EC 81 MG  EC tablet Take 1 tablet (81 mg total) by mouth daily. 60 tablet 1  . atorvastatin (LIPITOR) 40 MG tablet Take 40 mg by mouth daily.    . clopidogrel (PLAVIX) 75 MG tablet Take 1 tablet (75 mg total) by mouth daily with breakfast. 60 tablet 1  . clotrimazole-betamethasone (LOTRISONE) cream Apply 1 application topically 2 (two) times daily. Use for a maximum of up to 14 days per treatment course. 45 g 0  . cyclobenzaprine (FLEXERIL) 10 MG tablet Take 1 tablet (10 mg total) by mouth 2 (two) times daily as needed for muscle spasms. 20 tablet 0  . dapagliflozin propanediol  (FARXIGA) 10 MG TABS tablet Take 1 tablet (10 mg total) by mouth daily before breakfast. 30 tablet 11  . fluconazole (DIFLUCAN) 200 MG tablet Take 1 tablet (200 mg total) by mouth every 3 (three) days. 3 tablet 2  . fluticasone-salmeterol (ADVAIR HFA) 115-21 MCG/ACT inhaler Inhale 2 puffs into the lungs as needed.    . furosemide (LASIX) 40 MG tablet Take 1 tablet (40 mg total) by mouth daily. 60 tablet 0  . hydrALAZINE (APRESOLINE) 25 MG tablet Take 1.5 tablets (37.5 mg total) by mouth 3 (three) times daily. 135 tablet 3  . isosorbide mononitrate (IMDUR) 30 MG 24 hr tablet Take 1 tablet (30 mg total) by mouth daily. 30 tablet 1  . NARCAN 4 MG/0.1ML LIQD nasal spray kit Place 1 spray into the nose once.     . nitroGLYCERIN (NITROSTAT) 0.4 MG SL tablet Place 1 tablet (0.4 mg total) under the tongue every 5 (five) minutes as needed for chest pain. 25 tablet 6  . potassium chloride (KLOR-CON) 10 MEQ tablet Take 2 tablets (20 mEq total) by mouth 2 (two) times daily. 120 tablet 3  . predniSONE (DELTASONE) 20 MG tablet Take 2 tablets (40 mg total) by mouth daily. 10 tablet 0  . sacubitril-valsartan (ENTRESTO) 97-103 MG Take 1 tablet by mouth 2 (two) times daily. 60 tablet 5  . spironolactone (ALDACTONE) 25 MG tablet Take 1 tablet (25 mg total) by mouth daily. 30 tablet 3  . VOLTAREN 1 % GEL SMARTSIG:4 Gram(s) Topical 4 Times Daily PRN    . zolpidem (AMBIEN) 10 MG tablet Take 10 mg by mouth at bedtime as needed for sleep.     Current Facility-Administered Medications  Medication Dose Route Frequency Provider Last Rate Last Admin  . cefTRIAXone (ROCEPHIN) injection 500 mg  500 mg Intramuscular Once [name], MD        Allergies  Allergen Reactions  . Coreg [Carvedilol] Other (See Comments)    Headache  . Other Anaphylaxis and Hives    Fresh strawberries (throat swelled)  . Amoxicillin Nausea And Vomiting    Did it involve swelling of the face/tongue/throat, SOB, or low BP? Yes Did it  involve sudden or severe rash/hives, skin peeling, or any reaction on the inside of your mouth or nose? Yes Did you need to seek medical attention at a hospital or doctor's office? Yes When did it last happen?within last 10 years If all above answers are "NO", may proceed with cephalosporin use.   . Dilaudid [Hydromorphone Hcl] Nausea And Vomiting  . Ibuprofen Other (See Comments)    wheezing  . Latex Swelling    No reaction with bandaids  . Metoprolol     HEADACHE and Wheezing     Social History   Socioeconomic History  . Marital status: Single    Spouse name: Not on file  .  Number of children: 2  . Years of education: 17  . Highest education level: High school graduate  Occupational History  . Not on file  Tobacco Use  . Smoking status: Current Every Day Smoker    Packs/day: 0.50    Years: 34.00    Pack years: 17.00    Types: Cigarettes  . Smokeless tobacco: Never Used  Vaping Use  . Vaping Use: Never used  Substance and Sexual Activity  . Alcohol use: No    Alcohol/week: 0.0 standard drinks    Comment: seldom  . Drug use: Yes    Types: Marijuana    Comment: 06/19/19  . Sexual activity: Yes    Partners: Male    Birth control/protection: Post-menopausal  Other Topics Concern  . Not on file  Social History Narrative  . Not on file   Social Determinants of Health   Financial Resource Strain: Not on file  Food Insecurity: Not on file  Transportation Needs: Not on file  Physical Activity: Not on file  Stress: Not on file  Social Connections: Not on file  Intimate Partner Violence: Not on file     Family History  Problem Relation Age of Onset  . Leukemia Mother   . Clotting disorder Father        blood clot  . Hypertension Sister   . Diabetes Sister   . Stroke Sister 32  . Bleeding Disorder Son        ITP "free bleeding disorder"  . Colon cancer Paternal Grandmother   . Diabetes Paternal Grandmother   . Stomach cancer Neg Hx   . Pancreatic  cancer Neg Hx     Vitals:   12/16/20 1059  BP: (!) 141/99  Pulse: 73  SpO2: 98%  Weight: 90.6 kg (199 lb 12.8 oz)   Wt Readings from Last 3 Encounters:  12/16/20 90.6 kg (199 lb 12.8 oz)  11/09/20 92.5 kg (204 lb)  10/21/20 88.1 kg (194 lb 3.2 oz)    PHYSICAL EXAM: General:  NAD. No resp difficulty HEENT: Normal Neck: Supple. No JVD. Carotids 2+ bilat; no bruits. No lymphadenopathy or thryomegaly appreciated. Cor: PMI nondisplaced. Regular rate & rhythm. No rubs, gallops or murmurs. Lungs: Clear, faint wheeze RLL. Abdomen: Obese, soft, nontender, nondistended. No hepatosplenomegaly. No bruits or masses. Good bowel sounds. Extremities: No cyanosis, clubbing, rash, edema Neuro: alert & oriented x 3, cranial nerves grossly intact. Moves all 4 extremities w/o difficulty. Affect pleasant.  ASSESSMENT & PLAN:  1. Chronic systolic CHF due to iCM - Echo 10/20 EF 25-30% - Echo 05/2020 EF 40-45%  - NYHA [name]. Volume status stable.  - Continue Entresto 97-103 twice daily. - Continue spironolactone 25 mg daily. - Continue hydralazine to 37.5 mg three times a day + Imdur 30 daily. - Continue Farxiga 10 mg daily.  - PA submitted for bisoprolol. Not covered by Medicaid. She can not afford. Intolerant metoprolol & carvedilol due to headache.   - BMET today.  2. CAD S/P multiple PCIs - s/p PCI in [place] - s/p OM PCI '08-Dr Claiborne Billings - s/p LAD PCI/DES 2012 - s/p pLAD PCI with DES 06/19/2019- Dr Gwenlyn Found after patient turned down for CABG. Known occluded RCA with L-R collaterals, 65% mCFX-EF 25% - No chest pain.  - Continue ASA/statin.  - Manged by Dr. Gwenlyn Found.   3. HTN - Still elevated.  - Increase hydralazine to 50 mg three times a day.   - May be able to back off when we  start bisoprolol. - Continue to check BP at home.  4. COPD with ongoing tobacco use - Continues to smoke 1 ppd. Discussed smoking cessation.  - Encouraged complete cessation. Not ready to quit.  5. OSA: -  Intolerant CPAP.   6. PVCs - See Zio report. - Start bisoprolol 2.5 mg daily after PA. - Not ideal amio candidate with lung disease.  7. Obesity Body mass index is 37.75 kg/m. - Encouraged portion control and low-carb diet.  Follow up in 3 months.   [place] FNP-BC  12/16/20 11:02 AM

## 2020-12-16 ENCOUNTER — Encounter (HOSPITAL_COMMUNITY): Payer: Self-pay

## 2020-12-16 ENCOUNTER — Other Ambulatory Visit (HOSPITAL_COMMUNITY): Payer: Self-pay | Admitting: Family Medicine

## 2020-12-16 ENCOUNTER — Ambulatory Visit (HOSPITAL_COMMUNITY)
Admission: RE | Admit: 2020-12-16 | Discharge: 2020-12-16 | Disposition: A | Discharge status: Home / Self Care | Payer: Medicaid Other | Source: Ambulatory Visit | Attending: Family Medicine | Admitting: Family Medicine

## 2020-12-16 ENCOUNTER — Telehealth (HOSPITAL_COMMUNITY): Payer: Self-pay | Admitting: Pharmacist

## 2020-12-16 ENCOUNTER — Other Ambulatory Visit: Payer: Self-pay

## 2020-12-16 VITALS — BP 141/99 | HR 73 | Wt 199.8 lb

## 2020-12-16 DIAGNOSIS — Z91040 Latex allergy status: Secondary | ICD-10-CM | POA: Insufficient documentation

## 2020-12-16 DIAGNOSIS — Z7984 Long term (current) use of oral hypoglycemic drugs: Secondary | ICD-10-CM | POA: Insufficient documentation

## 2020-12-16 DIAGNOSIS — I25110 Atherosclerotic heart disease of native coronary artery with unstable angina pectoris: Secondary | ICD-10-CM | POA: Diagnosis not present

## 2020-12-16 DIAGNOSIS — Z886 Allergy status to analgesic agent status: Secondary | ICD-10-CM | POA: Insufficient documentation

## 2020-12-16 DIAGNOSIS — Z6837 Body mass index (BMI) 37.0-37.9, adult: Secondary | ICD-10-CM | POA: Insufficient documentation

## 2020-12-16 DIAGNOSIS — G4733 Obstructive sleep apnea (adult) (pediatric): Secondary | ICD-10-CM | POA: Diagnosis not present

## 2020-12-16 DIAGNOSIS — Z885 Allergy status to narcotic agent status: Secondary | ICD-10-CM | POA: Insufficient documentation

## 2020-12-16 DIAGNOSIS — E119 Type 2 diabetes mellitus without complications: Secondary | ICD-10-CM | POA: Insufficient documentation

## 2020-12-16 DIAGNOSIS — Z7982 Long term (current) use of aspirin: Secondary | ICD-10-CM | POA: Diagnosis not present

## 2020-12-16 DIAGNOSIS — I110 Hypertensive heart disease with heart failure: Secondary | ICD-10-CM | POA: Insufficient documentation

## 2020-12-16 DIAGNOSIS — F17210 Nicotine dependence, cigarettes, uncomplicated: Secondary | ICD-10-CM | POA: Diagnosis not present

## 2020-12-16 DIAGNOSIS — Z833 Family history of diabetes mellitus: Secondary | ICD-10-CM | POA: Insufficient documentation

## 2020-12-16 DIAGNOSIS — I493 Ventricular premature depolarization: Secondary | ICD-10-CM | POA: Diagnosis not present

## 2020-12-16 DIAGNOSIS — Z791 Long term (current) use of non-steroidal anti-inflammatories (NSAID): Secondary | ICD-10-CM | POA: Diagnosis not present

## 2020-12-16 DIAGNOSIS — Z79899 Other long term (current) drug therapy: Secondary | ICD-10-CM | POA: Insufficient documentation

## 2020-12-16 DIAGNOSIS — Z955 Presence of coronary angioplasty implant and graft: Secondary | ICD-10-CM | POA: Insufficient documentation

## 2020-12-16 DIAGNOSIS — I255 Ischemic cardiomyopathy: Secondary | ICD-10-CM | POA: Diagnosis not present

## 2020-12-16 DIAGNOSIS — Z8249 Family history of ischemic heart disease and other diseases of the circulatory system: Secondary | ICD-10-CM | POA: Insufficient documentation

## 2020-12-16 DIAGNOSIS — J449 Chronic obstructive pulmonary disease, unspecified: Secondary | ICD-10-CM | POA: Insufficient documentation

## 2020-12-16 DIAGNOSIS — Z7952 Long term (current) use of systemic steroids: Secondary | ICD-10-CM | POA: Diagnosis not present

## 2020-12-16 DIAGNOSIS — I5022 Chronic systolic (congestive) heart failure: Secondary | ICD-10-CM | POA: Diagnosis not present

## 2020-12-16 DIAGNOSIS — I252 Old myocardial infarction: Secondary | ICD-10-CM | POA: Diagnosis not present

## 2020-12-16 DIAGNOSIS — Z888 Allergy status to other drugs, medicaments and biological substances status: Secondary | ICD-10-CM | POA: Diagnosis not present

## 2020-12-16 DIAGNOSIS — Z7951 Long term (current) use of inhaled steroids: Secondary | ICD-10-CM | POA: Insufficient documentation

## 2020-12-16 DIAGNOSIS — I2510 Atherosclerotic heart disease of native coronary artery without angina pectoris: Secondary | ICD-10-CM | POA: Diagnosis not present

## 2020-12-16 DIAGNOSIS — I471 Supraventricular tachycardia: Secondary | ICD-10-CM | POA: Insufficient documentation

## 2020-12-16 DIAGNOSIS — Z880 Allergy status to penicillin: Secondary | ICD-10-CM | POA: Insufficient documentation

## 2020-12-16 DIAGNOSIS — E669 Obesity, unspecified: Secondary | ICD-10-CM | POA: Diagnosis not present

## 2020-12-16 DIAGNOSIS — Z720 Tobacco use: Secondary | ICD-10-CM

## 2020-12-16 DIAGNOSIS — I10 Essential (primary) hypertension: Secondary | ICD-10-CM

## 2020-12-16 LAB — BASIC METABOLIC PANEL
Anion gap: 11 (ref 5–15)
BUN: 12 mg/dL (ref 6–20)
CO2: 25 mmol/L (ref 22–32)
Calcium: 9.5 mg/dL (ref 8.9–10.3)
Chloride: 103 mmol/L (ref 98–111)
Creatinine, Ser: 1.19 mg/dL — ABNORMAL HIGH (ref 0.44–1.00)
GFR, Estimated: 53 mL/min — ABNORMAL LOW (ref >60)
Glucose, Bld: 100 mg/dL — ABNORMAL HIGH (ref 70–99)
Potassium: 4.2 mmol/L (ref 3.5–5.1)
Sodium: 139 mmol/L (ref 135–145)

## 2020-12-16 MED ORDER — HYDRALAZINE HCL 50 MG PO TABS: 50.0000 mg | ORAL_TABLET | Freq: Three times a day (TID) | ORAL | 3 refills | Status: DC

## 2020-12-16 MED FILL — hydrALAZINE HCL 50 MG TABS: 50 | 30 days supply | Qty: 90 | Fill #0

## 2020-12-16 NOTE — Patient Instructions (Signed)
INCREASE Hydralazine to 50 mg, one tab three times daily  Labs today We will only contact you if something comes back abnormal or we need to make some changes. Otherwise no news is good news!  Your physician recommends that you schedule a follow-up appointment in: 3- 4 months  in the Advanced Practitioners (PA/NP) Clinic    If you have any questions or concerns before your next appointment please send [place] a message through [place] or call our office at [phone].    TO LEAVE A MESSAGE FOR THE NURSE SELECT OPTION 2, PLEASE LEAVE A MESSAGE INCLUDING: . YOUR NAME . DATE OF BIRTH . CALL BACK NUMBER . REASON FOR CALL**this is important as we prioritize the call backs  YOU WILL RECEIVE A CALL BACK THE SAME DAY AS LONG AS YOU CALL BEFORE 4:00 PM

## 2020-12-16 NOTE — Telephone Encounter (Signed)
Patient Advocate Encounter   Received notification from [facility] that prior authorization for nebivolol is required.   PA submitted on CoverMyMeds Key [id number] Status is pending   Will continue to follow.   Audry Riles, PharmD, BCPS, BCCP, CPP Heart Failure Clinic Pharmacist [phone]

## 2020-12-16 NOTE — Telephone Encounter (Signed)
Advanced Heart Failure Patient Advocate Encounter  Prior Authorization for nebivolol has been approved.    PA# [id number] Effective dates: 12/16/20 through 12/16/2021   Audry Riles, PharmD, BCPS, BCCP, CPP Heart Failure Clinic Pharmacist [phone]

## 2020-12-18 ENCOUNTER — Other Ambulatory Visit: Payer: Self-pay | Admitting: Obstetrics

## 2020-12-18 ENCOUNTER — Other Ambulatory Visit (HOSPITAL_COMMUNITY): Payer: Self-pay | Admitting: Internal Medicine

## 2020-12-18 MED FILL — NITROGLYCERIN 0.4 MG TAB SL: 0.4 | 25 days supply | Qty: 25 | Fill #0

## 2020-12-18 MED FILL — CLOPIDOGREL 75 MG TABLET: 75 | 90 days supply | Qty: 90 | Fill #0

## 2020-12-18 MED FILL — ATORVASTATIN 80 MG TABLET: 80 | 90 days supply | Qty: 90 | Fill #0

## 2020-12-18 MED FILL — ISOSORBIDE MN ER 30 MG TAB: 30 | 90 days supply | Qty: 90 | Fill #0

## 2021-01-06 ENCOUNTER — Other Ambulatory Visit: Payer: Self-pay | Admitting: Internal Medicine

## 2021-01-06 DIAGNOSIS — N644 Mastodynia: Secondary | ICD-10-CM

## 2021-01-29 ENCOUNTER — Other Ambulatory Visit: Payer: Self-pay | Admitting: Internal Medicine

## 2021-01-29 ENCOUNTER — Ambulatory Visit (INDEPENDENT_AMBULATORY_CARE_PROVIDER_SITE_OTHER): Payer: Medicaid Other | Admitting: Internal Medicine

## 2021-01-29 ENCOUNTER — Other Ambulatory Visit: Payer: Self-pay

## 2021-01-29 ENCOUNTER — Encounter: Payer: Self-pay | Admitting: Internal Medicine

## 2021-01-29 VITALS — BP 132/100 | HR 62 | Ht 61.0 in | Wt 202.2 lb

## 2021-01-29 DIAGNOSIS — I5022 Chronic systolic (congestive) heart failure: Secondary | ICD-10-CM | POA: Diagnosis not present

## 2021-01-29 DIAGNOSIS — E785 Hyperlipidemia, unspecified: Secondary | ICD-10-CM

## 2021-01-29 DIAGNOSIS — I2510 Atherosclerotic heart disease of native coronary artery without angina pectoris: Secondary | ICD-10-CM

## 2021-01-29 DIAGNOSIS — I255 Ischemic cardiomyopathy: Secondary | ICD-10-CM

## 2021-01-29 MED ORDER — REPATHA SURECLICK 140 MG/ML ~~LOC~~ SOAJ
1.0000 | SUBCUTANEOUS | 11 refills | Status: DC
Start: 2021-01-29 — End: 2021-01-29

## 2021-01-29 MED FILL — ZOLPIDEM TARTRATE 10 MG TAB: 10 | 30 days supply | Qty: 15 | Fill #0

## 2021-01-29 MED FILL — FARXIGA 10 MG TABLET: 10 | 30 days supply | Qty: 30 | Fill #1

## 2021-01-29 NOTE — Progress Notes (Addendum)
LIPID CLINIC CONSULT NOTE  Chief Complaint:  Manage dyslipidemia  Primary Care Physician: Nolene Ebbs, MD  Primary Cardiologist:  Quay Burow, MD  HPI:  Tracey Manning is a 60 y.o. female who is being seen today for the evaluation of dyslipidemia at the request of [name], MD. This is a pleasant 60 year old female kindly referred for evaluation and management of dyslipidemia.  She has a history of coronary artery disease of multiple vessels with history of PCI.  She also has a presumed ischemic cardiomyopathy with LVEF of 40 to 45% recently but as low as 25% followed by the advanced heart failure clinic.  She was referred today for evaluation management of dyslipidemia.  She has concerns today about headaches.  She said this it started after starting isosorbide.  She has been taking the medicine intermittently because of persistent migraine  From a cholesterol standpoint, total cholesterol recently was 199, triglycerides 91, HDL 50 and LDL 133.  She is on high potency atorvastatin 40 mg daily for which she says she takes regularly.  She is also tried to improve her diet.  Unfortunately she continues to smoke and has difficulty in stopping.  PMHx:  Past Medical History:  Diagnosis Date  . Anxiety   . Arthritis   . Asthma   . Barrett's esophagus   . Chest pain   . Collagen vascular disease ([place])   . Colon polyps   . Coronary artery disease   . Depression   . Dyslipidemia   . GERD (gastroesophageal reflux disease)    occ  . Hyperlipidemia   . Hypertension   . Myocardial infarct ([place]) [id number]  . Sciatica   . Sleep apnea    no CPAP ordered but using oxygen at bedtime  . Tobacco abuse     Past Surgical History:  Procedure Laterality Date  . CARDIAC CATHETERIZATION  10/09/2003   [place]) - LAD with 30% prox narrowing, 50% stenosis in mid-portion of PLA; RCA with 40% narrowing proximally ([name], [name])  . CARDIAC  CATHETERIZATION  10/10/2007   70% stenosis in first septal perforator branch of LAD, 60-70% narrowing in mid LAD, 20% narrowing in mid AV groove Cfx with 80% diffuse narrowing in small distal marginal, total occlusion of mid RCA with L to R collaterals, 90% stenosis diffusely in prox branch of RCA followed by 70% stenosis in secondary curve & 80% stenosis in small marginal branch ([name])  . CARDIAC CATHETERIZATION  05/28/2008   normal L main, RCA with 100% prox lesion w/distal filling from LAD collaterals, LAD with 20% prox tubular lesion/ 40% mid LAD lesion/previous stent patent ([name])  . CARDIAC CATHETERIZATION  09/15/2009   discrete 100% osital RCA lesion, 50% prox LAD lesion, non-obstructive disease in all coronaries ([name])  . CESAREAN SECTION    . COLONOSCOPY    . COLONOSCOPY W/ POLYPECTOMY    . CORONARY ANGIOPLASTY WITH STENT PLACEMENT  10/31/2007   PCI of distal Cfx with 2.25x34m Taxus Adam DES, 60% narrowing of mid LAD (Dr. TCorky Downs  . CORONARY ANGIOPLASTY WITH STENT PLACEMENT  06/11/2011   PCI of prox-mid LAD with 3x156mDES Resolute (Dr. T.[name] . CORONARY STENT INTERVENTION N/A 06/19/2019   Procedure: CORONARY STENT INTERVENTION;  Surgeon: BeLorretta HarpMD;  [facility];  Service: Cardiovascular;  Laterality: N/A;  LAD  . DILATION AND CURETTAGE, DIAGNOSTIC / THERAPEUTIC    . DILITATION &  CURRETTAGE/HYSTROSCOPY WITH HYDROTHERMAL ABLATION N/A 04/03/2016   Procedure: DILATATION & CURETTAGE/HYSTEROSCOPY WITH HYDROTHERMAL ABLATION;  Surgeon: [name], MD;  Location: [facility];  Service: Gynecology;  Laterality: N/A;  . NM MYOCAR PERF WALL MOTION  08/2009   persantine myoview - normal perfusion in all regions, perfusion defect in anterior region (breast attenuation), EF 52%, low risk scan  . RIGHT/LEFT HEART CATH AND CORONARY ANGIOGRAPHY N/A 06/15/2019   Procedure: RIGHT/LEFT HEART CATH AND CORONARY ANGIOGRAPHY;  Surgeon: Belva Crome, MD;   Location: [facility];  Service: Cardiovascular;  Laterality: N/A;    FAMHx:  Family History  Problem Relation Age of Onset  . Leukemia Mother   . Clotting disorder Father        blood clot  . Hypertension Sister   . Diabetes Sister   . Stroke Sister 41  . Bleeding Disorder Son        ITP "free bleeding disorder"  . Colon cancer Paternal Grandmother   . Diabetes Paternal Grandmother   . Stomach cancer Neg Hx   . Pancreatic cancer Neg Hx     SOCHx:   reports that she has been smoking cigarettes. She has a 17.00 pack-year smoking history. She has never used smokeless tobacco. She reports current drug use. Drug: Marijuana. She reports that she does not drink alcohol.  ALLERGIES:  Allergies  Allergen Reactions  . Coreg [Carvedilol] Other (See Comments)    Headache  . Other Anaphylaxis and Hives    Fresh strawberries (throat swelled)  . Amoxicillin Nausea And Vomiting    Did it involve swelling of the face/tongue/throat, SOB, or low BP? Yes Did it involve sudden or severe rash/hives, skin peeling, or any reaction on the inside of your mouth or nose? Yes Did you need to seek medical attention at a hospital or doctor's office? Yes When did it last happen?within last 10 years If all above answers are "NO", may proceed with cephalosporin use.   . Dilaudid [Hydromorphone Hcl] Nausea And Vomiting  . Ibuprofen Other (See Comments)    wheezing  . Latex Swelling    No reaction with bandaids  . Metoprolol     HEADACHE and Wheezing    ROS: Pertinent items noted in HPI and remainder of comprehensive ROS otherwise negative.  HOME MEDS: Current Outpatient Medications on File Prior to Visit  Medication Sig Dispense Refill  . ACETAMINOPHEN EXTRA STRENGTH 500 MG tablet Take 1,000 mg by mouth every 6 (six) hours as needed.    Marland Kitchen albuterol (PROVENTIL HFA;VENTOLIN HFA) 108 (90 BASE) MCG/ACT inhaler Inhale 2 puffs into the lungs every 6 (six) hours as needed for wheezing or  shortness of breath.    Marland Kitchen albuterol (PROVENTIL) (2.5 MG/3ML) 0.083% nebulizer solution Take 6 mLs by nebulization every 6 (six) hours as needed for wheezing or shortness of breath.    Marland Kitchen aspirin EC 81 MG EC tablet Take 1 tablet (81 mg total) by mouth daily. 60 tablet 1  . atorvastatin (LIPITOR) 40 MG tablet Take 40 mg by mouth daily.    . benzonatate (TESSALON) 100 MG capsule Take 200 mg by mouth every 8 (eight) hours as needed.    . clopidogrel (PLAVIX) 75 MG tablet Take 1 tablet (75 mg total) by mouth daily with breakfast. 60 tablet 1  . cyclobenzaprine (FLEXERIL) 10 MG tablet Take 1 tablet (10 mg total) by mouth 2 (two) times daily as needed for muscle spasms. 20 tablet 0  . dapagliflozin propanediol (FARXIGA) 10 MG  TABS tablet Take 1 tablet (10 mg total) by mouth daily before breakfast. 30 tablet 11  . fluticasone-salmeterol (ADVAIR HFA) 115-21 MCG/ACT inhaler Inhale 2 puffs into the lungs as needed.    . furosemide (LASIX) 40 MG tablet Take 1 tablet (40 mg total) by mouth daily. 60 tablet 0  . hydrALAZINE (APRESOLINE) 50 MG tablet Take 1 tablet (50 mg total) by mouth 3 (three) times daily. 90 tablet 3  . isosorbide mononitrate (IMDUR) 30 MG 24 hr tablet Take 1 tablet (30 mg total) by mouth daily. 30 tablet 1  . NARCAN 4 MG/0.1ML LIQD nasal spray kit Place 1 spray into the nose once.     . nitroGLYCERIN (NITROSTAT) 0.4 MG SL tablet Place 1 tablet (0.4 mg total) under the tongue every 5 (five) minutes as needed for chest pain. 25 tablet 6  . ofloxacin (OCUFLOX) 0.3 % ophthalmic solution Instill 1 drop TID in operative eye starting 2 days prior to surgery and after surgery for 3 weeks.    . potassium chloride (KLOR-CON) 10 MEQ tablet Take 2 tablets (20 mEq total) by mouth 2 (two) times daily. 120 tablet 3  . prednisoLONE acetate (PRED FORTE) 1 % ophthalmic suspension One drop in operative eye(s) TID starting 2 days before surgery and after surgery for 3 weeks    . predniSONE (DELTASONE) 20 MG  tablet Take 2 tablets (40 mg total) by mouth daily. 10 tablet 0  . sacubitril-valsartan (ENTRESTO) 97-103 MG Take 1 tablet by mouth 2 (two) times daily. 60 tablet 5  . spironolactone (ALDACTONE) 25 MG tablet Take 1 tablet (25 mg total) by mouth daily. 30 tablet 3  . VOLTAREN 1 % GEL SMARTSIG:4 Gram(s) Topical 4 Times Daily PRN    . zolpidem (AMBIEN) 10 MG tablet Take 10 mg by mouth at bedtime as needed for sleep.    . nebivolol (BYSTOLIC) 10 MG tablet Take 10 mg by mouth daily. Patient not taking (Patient not taking: No sig reported)     Current Facility-Administered Medications on File Prior to Visit  Medication Dose Route Frequency Provider Last Rate Last Admin  . cefTRIAXone (ROCEPHIN) injection 500 mg  500 mg Intramuscular Once [name], MD        LABS/IMAGING: No results found for this or any previous visit (from the past 48 hour(s)). No results found.  LIPID PANEL:    Component Value Date/Time   CHOL 199 10/11/2020 1119   TRIG 91 10/11/2020 1119   HDL 50 10/11/2020 1119   CHOLHDL 4.0 10/11/2020 1119   CHOLHDL 3.9 06/14/2019 0216   VLDL 32 06/14/2019 0216   LDLCALC 133 (H) 10/11/2020 1119    WEIGHTS: Wt Readings from Last 3 Encounters:  01/29/21 202 lb 3.2 oz (91.7 kg)  12/16/20 199 lb 12.8 oz (90.6 kg)  11/09/20 204 lb (92.5 kg)    VITALS: BP (!) 132/100 (BP Location: Left Arm, Patient Position: Sitting)   Pulse 62   Ht 5' 1"  (1.549 m)   Wt 202 lb 3.2 oz (91.7 kg)   LMP  (LMP Unknown)   SpO2 99%   BMI 38.21 kg/m   EXAM: General appearance: alert and no distress Neck: no carotid bruit, no JVD and thyroid not enlarged, symmetric, no tenderness/mass/nodules Lungs: clear to auscultation bilaterally Heart: regular rate and rhythm, S1, S2 normal, no murmur, click, rub or gallop Abdomen: soft, non-tender; bowel sounds normal; no masses,  no organomegaly Extremities: extremities normal, atraumatic, no cyanosis or edema Pulses: 2+ and symmetric  Skin: Skin  color, texture, turgor normal. No rashes or lesions Neurologic: Grossly normal Psych: Tearful   *Examination chaperoned by Sheral Apley, RN  EKG: Deferred  ASSESSMENT: 1. Mixed dyslipidemia, goal LDL less than 70 2. Multivessel coronary artery disease with prior PCI 3. Ischemic cardiomyopathy LVEF 40 to 45% (05/2020) 4. Ongoing tobacco use  PLAN: 1.   Ms. Bowdish has a mixed dyslipidemia with a goal LDL less than 70.  She is on high potency atorvastatin.  She needs additional risk reduction including at least a 50% reduction in LDL and would benefit from a PCSK9 inhibitor. She is unlikely to achieve this with adding Zetia. I would recommend Repatha 140 mg every 2 weeks.  We will work to achieve prior authorization for this and use it in addition to her atorvastatin.  Plan repeat lipids in about 3 months and follow-up with me after then.  With regards to her recurrent headaches, she may wish to consider decreasing her isosorbide from 30 to 15 mg and trying to take it more regularly rather than taking the isosorbide full dose sporadically.  I will forward this note to the heart failure clinic so they are aware of her use of the medication.  Pixie Casino, MD, [facility], [place] Director of the Advanced Lipid Disorders &  Cardiovascular Risk Reduction Clinic Diplomate of the American Board of Clinical Lipidology Attending Cardiologist  Direct Dial: [phone]  Fax: [phone]  Website:  [email or web address]  Nadean Corwin Olin Gurski 01/29/2021, 1:26 PM

## 2021-01-29 NOTE — Patient Instructions (Signed)
Medication Instructions:  Dr. Debara Pickett recommends Repatha 140mg /mL (PCSK9). This is an injectable cholesterol medication self-administered once every 14 days. This medication will likely need prior approval with your insurance company, which we will work on. If the medication is not approved initially, we may need to do an appeal with your insurance.   Administer medication in area of fatty tissue such as abdomen, outer thigh, back of upper arm - and rotate site with each injection Store medication in refrigerator until ready to administer - allow to sit at room temp for 30 mins - 1 hour prior to injection Dispose of medication in a SHARPS container - your pharmacy should be able to direct you on this and proper disposal   If you need co-pay assistance grant, please look into the program at healthwellfoundation.org >> disease funds >> hypercholesterolemia. This is an online application or you can call to complete. Once approved, you will provide the "pharmacy card" information to your pharmacy and they will deduct the co-pays from this grant.  If you need a co-pay card for Repatha: [email or web address] >> paying for Repatha or red box that says "Repatha Copay Card" in top right If you need a co-pay card for Praluent: WedMap.it >> starting & paying for Praluent  *If you need a refill on your cardiac medications before your next appointment, please call your pharmacy*   Lab Work: FASTING lab work in 3-4 months to check cholesterol   If you have labs (blood work) drawn today and your tests are completely normal, you will receive your results only by: Marland Kitchen MyChart Message (if you have MyChart) OR . A paper copy in the mail If you have any lab test that is abnormal or we need to change your treatment, we will call you to review the results.   Testing/Procedures: NONE   Follow-Up: At [facility], you and your health needs are our priority.  As part of our continuing mission to provide you with  exceptional heart care, we have created designated Provider Care Teams.  These Care Teams include your primary Cardiologist (physician) and Advanced Practice Providers (APPs -  Physician Assistants and Nurse Practitioners) who all work together to provide you with the care you need, when you need it.  We recommend signing up for the patient portal called "MyChart".  Sign up information is provided on this After Visit Summary.  MyChart is used to connect with patients for Virtual Visits (Telemedicine).  Patients are able to view lab/test results, encounter notes, upcoming appointments, etc.  Non-urgent messages can be sent to your provider as well.   To learn more about what you can do with MyChart, go to [email or web address].    Your next appointment:   3-4 month(s) - lipid clinic  The format for your next appointment:   In Person  Provider:   K. Mali Hilty, MD   Other Instructions

## 2021-01-31 ENCOUNTER — Telehealth: Payer: Self-pay | Admitting: Internal Medicine

## 2021-01-31 NOTE — Telephone Encounter (Signed)
PA for Repatha submitted via CMM to [employer] (Key: [id number])

## 2021-01-31 NOTE — Telephone Encounter (Signed)
Repatha denied -    Per your health plan's criteria, this drug is covered if you meet the following: (1) One of the following: (A) You are currently taking a maximum dose of atorvastatin or rosuvastatin for your age and have completed 90 days of treatment. (YES) (B) Both of the following:   (I) You cannot use atorvastatin or rosuvastatin (examples of clinically significant intolerance or allergic reaction to statin treatment includes severe muscle pain, significant liver abnormalities, and rhabdomyolysis. Intolerance does not include fatigue, cognitive impairment, or mild aches).   (II) You have tried lower doses of both atorvastatin and rosuvastatin.  (2) Submission of both of the following: (A) Lab test prior to starting treatment (baseline low-density lipoprotein labs). (B) Lab test after previous treatment (low-density lipoprotein labs showing inadequate control on statin and ezetimibe combination).  (3) You will continue to use this drug with high dose atorvastatin or rosuvastatin (unless otherwise approved due to intolerance). (YES)

## 2021-02-03 ENCOUNTER — Other Ambulatory Visit: Payer: Self-pay

## 2021-02-03 ENCOUNTER — Ambulatory Visit
Admission: RE | Admit: 2021-02-03 | Discharge: 2021-02-03 | Disposition: A | Discharge status: Home / Self Care | Payer: Medicaid Other | Source: Ambulatory Visit | Attending: Internal Medicine | Admitting: Internal Medicine

## 2021-02-03 ENCOUNTER — Other Ambulatory Visit: Payer: Medicaid Other

## 2021-02-03 DIAGNOSIS — N644 Mastodynia: Secondary | ICD-10-CM

## 2021-02-10 NOTE — Telephone Encounter (Signed)
Appeal for Armstrong faxed to [employer] - fax: [phone]

## 2021-02-12 ENCOUNTER — Other Ambulatory Visit (HOSPITAL_BASED_OUTPATIENT_CLINIC_OR_DEPARTMENT_OTHER): Payer: Self-pay

## 2021-02-14 ENCOUNTER — Other Ambulatory Visit (HOSPITAL_BASED_OUTPATIENT_CLINIC_OR_DEPARTMENT_OTHER): Payer: Self-pay

## 2021-02-17 NOTE — Telephone Encounter (Signed)
Original PA denied - baseline LDL and current LDL needed. Only current was supplied in initial PA request New PA submitted via CMM (Key: [id number])

## 2021-02-18 NOTE — Telephone Encounter (Signed)
Patient will need to sign member representation form for medication appeal - she is aware it will be at front desk. She will come today or tomorrow to sign.

## 2021-02-21 ENCOUNTER — Other Ambulatory Visit (HOSPITAL_COMMUNITY): Payer: Self-pay | Admitting: Internal Medicine

## 2021-02-21 MED FILL — VIT D2 1.25 MG (50,000 UNIT: 1.25 MG | 28 days supply | Qty: 4 | Fill #0

## 2021-02-21 MED FILL — CLOPIDOGREL 75 MG TABLET: 75 | 90 days supply | Qty: 90 | Fill #0

## 2021-02-21 MED FILL — ISOSORBIDE MN ER 30 MG TAB: 30 | 90 days supply | Qty: 90 | Fill #0

## 2021-02-21 MED FILL — CETIRIZINE HCL 10 MG TABS: 10 | 30 days supply | Qty: 30 | Fill #0

## 2021-02-21 MED FILL — hydrALAZINE HCL 50 MG TABS: 50 | 30 days supply | Qty: 90 | Fill #0

## 2021-02-21 MED FILL — ALBUTEROL 0.083 MG/ML SOLN: (2.5 MG/3ML | 15 days supply | Qty: 180 | Fill #0

## 2021-02-21 MED FILL — ATORVASTATIN 80 MG TABLET: 80 | 90 days supply | Qty: 90 | Fill #0

## 2021-02-21 MED FILL — FARXIGA 10 MG TABLET: 10 | 30 days supply | Qty: 30 | Fill #2

## 2021-02-21 MED FILL — NITROGLYCERIN 0.4 MG TAB SL: 0.4 | 25 days supply | Qty: 25 | Fill #0

## 2021-02-22 ENCOUNTER — Other Ambulatory Visit (HOSPITAL_COMMUNITY): Payer: Self-pay

## 2021-02-22 MED FILL — Albuterol Sulfate Inhal Aero 108 MCG/ACT (90MCG Base Equiv): RESPIRATORY_TRACT | 16 days supply | Qty: 8.5 | Fill #0 | Status: CN

## 2021-02-22 MED FILL — Diclofenac Sodium Gel 1% (1.16% Diethylamine Equiv): CUTANEOUS | 30 days supply | Qty: 500 | Fill #0 | Status: AC

## 2021-02-23 ENCOUNTER — Observation Stay (HOSPITAL_COMMUNITY)
Admission: EM | Admit: 2021-02-23 | Discharge: 2021-02-24 | Disposition: A | Discharge status: Home / Self Care | Payer: Medicaid Other | Attending: Internal Medicine | Admitting: Internal Medicine

## 2021-02-23 ENCOUNTER — Other Ambulatory Visit (HOSPITAL_COMMUNITY): Payer: Self-pay

## 2021-02-23 ENCOUNTER — Encounter (HOSPITAL_COMMUNITY): Payer: Self-pay

## 2021-02-23 ENCOUNTER — Emergency Department (HOSPITAL_COMMUNITY): Payer: Medicaid Other

## 2021-02-23 ENCOUNTER — Other Ambulatory Visit: Payer: Self-pay

## 2021-02-23 DIAGNOSIS — I130 Hypertensive heart and chronic kidney disease with heart failure and stage 1 through stage 4 chronic kidney disease, or unspecified chronic kidney disease: Secondary | ICD-10-CM | POA: Insufficient documentation

## 2021-02-23 DIAGNOSIS — I5041 Acute combined systolic (congestive) and diastolic (congestive) heart failure: Secondary | ICD-10-CM | POA: Diagnosis not present

## 2021-02-23 DIAGNOSIS — I2510 Atherosclerotic heart disease of native coronary artery without angina pectoris: Secondary | ICD-10-CM

## 2021-02-23 DIAGNOSIS — E876 Hypokalemia: Secondary | ICD-10-CM | POA: Insufficient documentation

## 2021-02-23 DIAGNOSIS — Z9861 Coronary angioplasty status: Secondary | ICD-10-CM

## 2021-02-23 DIAGNOSIS — J9601 Acute respiratory failure with hypoxia: Principal | ICD-10-CM | POA: Insufficient documentation

## 2021-02-23 DIAGNOSIS — Z7951 Long term (current) use of inhaled steroids: Secondary | ICD-10-CM | POA: Insufficient documentation

## 2021-02-23 DIAGNOSIS — I5021 Acute systolic (congestive) heart failure: Secondary | ICD-10-CM | POA: Diagnosis not present

## 2021-02-23 DIAGNOSIS — R7309 Other abnormal glucose: Secondary | ICD-10-CM | POA: Diagnosis not present

## 2021-02-23 DIAGNOSIS — F17200 Nicotine dependence, unspecified, uncomplicated: Secondary | ICD-10-CM | POA: Diagnosis not present

## 2021-02-23 DIAGNOSIS — E785 Hyperlipidemia, unspecified: Secondary | ICD-10-CM | POA: Insufficient documentation

## 2021-02-23 DIAGNOSIS — Z6830 Body mass index (BMI) 30.0-30.9, adult: Secondary | ICD-10-CM | POA: Diagnosis not present

## 2021-02-23 DIAGNOSIS — I255 Ischemic cardiomyopathy: Secondary | ICD-10-CM | POA: Diagnosis not present

## 2021-02-23 DIAGNOSIS — Z20822 Contact with and (suspected) exposure to covid-19: Secondary | ICD-10-CM | POA: Insufficient documentation

## 2021-02-23 DIAGNOSIS — J441 Chronic obstructive pulmonary disease with (acute) exacerbation: Secondary | ICD-10-CM | POA: Diagnosis not present

## 2021-02-23 DIAGNOSIS — K219 Gastro-esophageal reflux disease without esophagitis: Secondary | ICD-10-CM | POA: Diagnosis not present

## 2021-02-23 DIAGNOSIS — E669 Obesity, unspecified: Secondary | ICD-10-CM | POA: Insufficient documentation

## 2021-02-23 DIAGNOSIS — J449 Chronic obstructive pulmonary disease, unspecified: Secondary | ICD-10-CM | POA: Diagnosis present

## 2021-02-23 DIAGNOSIS — N1831 Chronic kidney disease, stage 3a: Secondary | ICD-10-CM | POA: Diagnosis not present

## 2021-02-23 DIAGNOSIS — R0602 Shortness of breath: Secondary | ICD-10-CM

## 2021-02-23 DIAGNOSIS — Z791 Long term (current) use of non-steroidal anti-inflammatories (NSAID): Secondary | ICD-10-CM | POA: Insufficient documentation

## 2021-02-23 DIAGNOSIS — I10 Essential (primary) hypertension: Secondary | ICD-10-CM | POA: Diagnosis present

## 2021-02-23 DIAGNOSIS — Z79899 Other long term (current) drug therapy: Secondary | ICD-10-CM | POA: Diagnosis not present

## 2021-02-23 DIAGNOSIS — I509 Heart failure, unspecified: Secondary | ICD-10-CM

## 2021-02-23 LAB — I-STAT ARTERIAL BLOOD GAS, ED
Acid-Base Excess: 0 mmol/L (ref 0.0–2.0)
Bicarbonate: 24.3 mmol/L (ref 20.0–28.0)
Calcium, Ion: 1.23 mmol/L (ref 1.15–1.40)
HCT: 40 % (ref 36.0–46.0)
Hemoglobin: 13.6 g/dL (ref 12.0–15.0)
O2 Saturation: 95 %
Patient temperature: 98.6
Potassium: 3.4 mmol/L — ABNORMAL LOW (ref 3.5–5.1)
Sodium: 140 mmol/L (ref 135–145)
TCO2: 25 mmol/L (ref 22–32)
pCO2 arterial: 36.5 mmHg (ref 32.0–48.0)
pH, Arterial: 7.432 (ref 7.350–7.450)
pO2, Arterial: 72 mmHg — ABNORMAL LOW (ref 83.0–108.0)

## 2021-02-23 LAB — BASIC METABOLIC PANEL
Anion gap: 6 (ref 5–15)
BUN: 17 mg/dL (ref 6–20)
CO2: 28 mmol/L (ref 22–32)
Calcium: 9.3 mg/dL (ref 8.9–10.3)
Chloride: 107 mmol/L (ref 98–111)
Creatinine, Ser: 1.21 mg/dL — ABNORMAL HIGH (ref 0.44–1.00)
GFR, Estimated: 52 mL/min — ABNORMAL LOW (ref >60)
Glucose, Bld: 124 mg/dL — ABNORMAL HIGH (ref 70–99)
Potassium: 3.1 mmol/L — ABNORMAL LOW (ref 3.5–5.1)
Sodium: 141 mmol/L (ref 135–145)

## 2021-02-23 LAB — HEPATIC FUNCTION PANEL
ALT: 19 U/L (ref 0–44)
AST: 16 U/L (ref 15–41)
Albumin: 3.5 g/dL (ref 3.5–5.0)
Alkaline Phosphatase: 66 U/L (ref 38–126)
Bilirubin, Direct: 0.1 mg/dL (ref 0.0–0.2)
Indirect Bilirubin: 0.6 mg/dL (ref 0.3–0.9)
Total Bilirubin: 0.7 mg/dL (ref 0.3–1.2)
Total Protein: 6.5 g/dL (ref 6.5–8.1)

## 2021-02-23 LAB — CBC WITH DIFFERENTIAL/PLATELET
Abs Immature Granulocytes: 0.03 10*3/uL (ref 0.00–0.07)
Basophils Absolute: 0 10*3/uL (ref 0.0–0.1)
Basophils Relative: 0 %
Eosinophils Absolute: 0.2 10*3/uL (ref 0.0–0.5)
Eosinophils Relative: 2 %
HCT: 37.2 % (ref 36.0–46.0)
Hemoglobin: 12.5 g/dL (ref 12.0–15.0)
Immature Granulocytes: 0 %
Lymphocytes Relative: 16 %
Lymphs Abs: 1.1 10*3/uL (ref 0.7–4.0)
MCH: 28.5 pg (ref 26.0–34.0)
MCHC: 33.6 g/dL (ref 30.0–36.0)
MCV: 84.9 fL (ref 80.0–100.0)
Monocytes Absolute: 0.4 10*3/uL (ref 0.1–1.0)
Monocytes Relative: 6 %
Neutro Abs: 5.5 10*3/uL (ref 1.7–7.7)
Neutrophils Relative %: 76 %
Platelets: 215 10*3/uL (ref 150–400)
RBC: 4.38 MIL/uL (ref 3.87–5.11)
RDW: 16.1 % — ABNORMAL HIGH (ref 11.5–15.5)
WBC: 7.2 10*3/uL (ref 4.0–10.5)
nRBC: 0 % (ref 0.0–0.2)

## 2021-02-23 LAB — HEMOGLOBIN A1C
Hgb A1c MFr Bld: 6 % — ABNORMAL HIGH (ref 4.8–5.6)
Mean Plasma Glucose: 125.5 mg/dL

## 2021-02-23 LAB — RESP PANEL BY RT-PCR (FLU A&B, COVID) ARPGX2
Influenza A by PCR: NEGATIVE
Influenza B by PCR: NEGATIVE
SARS Coronavirus 2 by RT PCR: NEGATIVE

## 2021-02-23 LAB — BRAIN NATRIURETIC PEPTIDE: B Natriuretic Peptide: 398.4 pg/mL — ABNORMAL HIGH (ref 0.0–100.0)

## 2021-02-23 LAB — TROPONIN I (HIGH SENSITIVITY)
Troponin I (High Sensitivity): 23 ng/L — ABNORMAL HIGH (ref <18)
Troponin I (High Sensitivity): 22 ng/L — ABNORMAL HIGH (ref <18)
Troponin I (High Sensitivity): 19 ng/L — ABNORMAL HIGH (ref <18)

## 2021-02-23 LAB — GLUCOSE, CAPILLARY: Glucose-Capillary: 226 mg/dL — ABNORMAL HIGH (ref 70–99)

## 2021-02-23 MED ORDER — METHYLPREDNISOLONE SODIUM SUCC 125 MG IJ SOLR
125.0000 mg | Freq: Once | INTRAMUSCULAR | Status: AC
  Administered 2021-02-23: 125 mg via INTRAVENOUS
  Filled 2021-02-23: qty 2

## 2021-02-23 MED ORDER — METHYLPREDNISOLONE SODIUM SUCC 40 MG IJ SOLR
40.0000 mg | Freq: Four times a day (QID) | INTRAMUSCULAR | Status: AC
  Administered 2021-02-23 – 2021-02-24 (×4): 40 mg via INTRAVENOUS
  Filled 2021-02-23 (×4): qty 1

## 2021-02-23 MED ORDER — ACETAMINOPHEN 325 MG PO TABS: 650.0000 mg | ORAL_TABLET | Freq: Four times a day (QID) | ORAL | Status: DC | PRN

## 2021-02-23 MED ORDER — POTASSIUM CHLORIDE CRYS ER 20 MEQ PO TBCR
40.0000 meq | EXTENDED_RELEASE_TABLET | Freq: Two times a day (BID) | ORAL | Status: DC
  Administered 2021-02-23 – 2021-02-24 (×3): 40 meq via ORAL
  Filled 2021-02-23 (×4): qty 2

## 2021-02-23 MED ORDER — MORPHINE SULFATE (PF) 2 MG/ML IV SOLN
2.0000 mg | Freq: Once | INTRAVENOUS | Status: AC
Start: 2021-02-23 — End: 2021-02-23
  Administered 2021-02-23: 2 mg via INTRAVENOUS
  Filled 2021-02-23: qty 1

## 2021-02-23 MED ORDER — FUROSEMIDE 10 MG/ML IJ SOLN
40.0000 mg | Freq: Two times a day (BID) | INTRAMUSCULAR | Status: DC
  Administered 2021-02-23 – 2021-02-24 (×2): 40 mg via INTRAVENOUS
  Filled 2021-02-23 (×2): qty 4

## 2021-02-23 MED ORDER — FENTANYL CITRATE (PF) 100 MCG/2ML IJ SOLN
50.0000 ug | Freq: Once | INTRAMUSCULAR | Status: AC
  Administered 2021-02-23: 50 ug via INTRAVENOUS
  Filled 2021-02-23: qty 2

## 2021-02-23 MED ORDER — OXYCODONE-ACETAMINOPHEN 5-325 MG PO TABS
1.0000 | ORAL_TABLET | ORAL | Status: DC | PRN
  Administered 2021-02-23 – 2021-02-24 (×2): 1 via ORAL
  Filled 2021-02-23 (×2): qty 1

## 2021-02-23 MED ORDER — ACETAMINOPHEN 650 MG RE SUPP: 650.0000 mg | Freq: Four times a day (QID) | RECTAL | Status: DC | PRN

## 2021-02-23 MED ORDER — INSULIN ASPART 100 UNIT/ML ~~LOC~~ SOLN: 0.0000 [IU] | Freq: Three times a day (TID) | SUBCUTANEOUS | Status: DC

## 2021-02-23 MED ORDER — POLYETHYLENE GLYCOL 3350 17 G PO PACK: 17.0000 g | PACK | Freq: Every day | ORAL | Status: DC | PRN

## 2021-02-23 MED ORDER — FUROSEMIDE 10 MG/ML IJ SOLN
40.0000 mg | Freq: Once | INTRAMUSCULAR | Status: AC
  Administered 2021-02-23: 40 mg via INTRAVENOUS
  Filled 2021-02-23: qty 4

## 2021-02-23 MED ORDER — DM-GUAIFENESIN ER 30-600 MG PO TB12
1.0000 | ORAL_TABLET | Freq: Two times a day (BID) | ORAL | Status: DC | PRN
  Filled 2021-02-23 (×2): qty 1

## 2021-02-23 MED ORDER — ONDANSETRON HCL 4 MG/2ML IJ SOLN: 4.0000 mg | Freq: Four times a day (QID) | INTRAMUSCULAR | Status: DC | PRN

## 2021-02-23 MED ORDER — IPRATROPIUM-ALBUTEROL 0.5-2.5 (3) MG/3ML IN SOLN
3.0000 mL | Freq: Four times a day (QID) | RESPIRATORY_TRACT | Status: DC
  Administered 2021-02-23 – 2021-02-24 (×3): 3 mL via RESPIRATORY_TRACT
  Filled 2021-02-23 (×3): qty 3

## 2021-02-23 MED ORDER — LEVALBUTEROL HCL 0.63 MG/3ML IN NEBU: 0.6300 mg | INHALATION_SOLUTION | Freq: Four times a day (QID) | RESPIRATORY_TRACT | Status: DC | PRN

## 2021-02-23 MED ORDER — INSULIN ASPART 100 UNIT/ML ~~LOC~~ SOLN: 0.0000 [IU] | Freq: Every day | SUBCUTANEOUS | Status: DC

## 2021-02-23 MED ORDER — IPRATROPIUM BROMIDE HFA 17 MCG/ACT IN AERS
2.0000 | INHALATION_SPRAY | Freq: Once | RESPIRATORY_TRACT | Status: AC
  Administered 2021-02-23: 2 via RESPIRATORY_TRACT
  Filled 2021-02-23: qty 12.9

## 2021-02-23 MED ORDER — BENZONATATE 100 MG PO CAPS
100.0000 mg | ORAL_CAPSULE | Freq: Two times a day (BID) | ORAL | Status: DC
  Administered 2021-02-23 – 2021-02-24 (×3): 100 mg via ORAL
  Filled 2021-02-23 (×4): qty 1

## 2021-02-23 MED ORDER — ENOXAPARIN SODIUM 40 MG/0.4ML ~~LOC~~ SOLN
40.0000 mg | SUBCUTANEOUS | Status: DC
  Filled 2021-02-23: qty 0.4

## 2021-02-23 MED ORDER — ZOLPIDEM TARTRATE 5 MG PO TABS
5.0000 mg | ORAL_TABLET | Freq: Every evening | ORAL | Status: DC | PRN
  Administered 2021-02-23: 5 mg via ORAL
  Filled 2021-02-23: qty 1

## 2021-02-23 MED ORDER — PREDNISONE 20 MG PO TABS: 40.0000 mg | ORAL_TABLET | Freq: Every day | ORAL | Status: DC

## 2021-02-23 MED ORDER — ALBUTEROL SULFATE HFA 108 (90 BASE) MCG/ACT IN AERS
6.0000 | INHALATION_SPRAY | Freq: Once | RESPIRATORY_TRACT | Status: AC
  Administered 2021-02-23: 6 via RESPIRATORY_TRACT
  Filled 2021-02-23: qty 6.7

## 2021-02-23 MED ORDER — HYDRALAZINE HCL 50 MG PO TABS
50.0000 mg | ORAL_TABLET | Freq: Three times a day (TID) | ORAL | Status: DC
  Administered 2021-02-23 – 2021-02-24 (×2): 50 mg via ORAL
  Filled 2021-02-23 (×2): qty 1

## 2021-02-23 MED ORDER — ONDANSETRON HCL 4 MG PO TABS: 4.0000 mg | ORAL_TABLET | Freq: Four times a day (QID) | ORAL | Status: DC | PRN

## 2021-02-23 MED ORDER — NICOTINE 14 MG/24HR TD PT24
14.0000 mg | MEDICATED_PATCH | Freq: Every day | TRANSDERMAL | Status: DC
  Filled 2021-02-23: qty 1

## 2021-02-23 NOTE — ED Provider Notes (Signed)
[facility] EMERGENCY DEPARTMENT Provider Note   CSN: [id number] Arrival date & time: 02/23/21  1134     History Chief Complaint  Patient presents with  . Shortness of Breath  . Chest Pain    Since Friday, saw PCP on Friday, she refused to go to hosp then, has been doing frequent albuterol txs, on home O2 prn, wearing all weekend, rales in RLL, cp in center of chest with increase on inspiration and tender on palpation    Tracey Manning is a 60 y.o. female.  The history is provided by the patient.  Shortness of Breath Severity:  Moderate Onset quality:  Gradual Timing:  Constant Progression:  Unchanged Chronicity:  Recurrent Context: not URI   Context comment:  Asthma symptoms for the last few days. Not getting better with breathing rx. Missed some doses of lasix but no leg swelling Relieved by:  Inhaler Worsened by:  Activity and coughing Associated symptoms: chest pain, cough and wheezing   Associated symptoms: no abdominal pain, no ear pain, no fever, no rash, no sore throat, no sputum production and no vomiting        Past Medical History:  Diagnosis Date  . Anxiety   . Arthritis   . Asthma   . Barrett's esophagus   . Chest pain   . Collagen vascular disease ([place])   . Colon polyps   . Coronary artery disease   . Depression   . Dyslipidemia   . GERD (gastroesophageal reflux disease)    occ  . Hyperlipidemia   . Hypertension   . Myocardial infarct ([place]) [id number]  . Sciatica   . Sleep apnea    no CPAP ordered but using oxygen at bedtime  . Tobacco abuse     Patient Active Problem List   Diagnosis Date Noted  . Impingement syndrome of left shoulder 03/12/2020  . Chronic pain 07/18/2019  . CRI (chronic renal insufficiency), stage 3 (moderate) 07/18/2019  . COPD (chronic obstructive pulmonary disease) ([place]) 06/28/2019  . Obesity (BMI 30-39.9) 06/28/2019  . Ischemic cardiomyopathy 06/28/2019  . Dyspnea on exertion 06/15/2019  . Acute  systolic CHF (congestive heart failure) ([place])   . AKI (acute kidney injury) ([place])   . Abnormal uterine bleeding (AUB) 04/03/2016  . Dysfunctional uterine bleeding 11/14/2015  . Bacterial vaginosis (recurrent) 11/14/2015  . Chest pain 11/14/2015  . Essential hypertension 04/04/2014  . Dyslipidemia, goal LDL below 70 04/04/2014  . CAD S/P multiple PCIs 04/04/2014    Past Surgical History:  Procedure Laterality Date  . CARDIAC CATHETERIZATION  10/09/2003   [facility] [place]) - LAD with 30% prox narrowing, 50% stenosis in mid-portion of PLA; RCA with 40% narrowing proximally ([name], [name])  . CARDIAC CATHETERIZATION  10/10/2007   70% stenosis in first septal perforator branch of LAD, 60-70% narrowing in mid LAD, 20% narrowing in mid AV groove Cfx with 80% diffuse narrowing in small distal marginal, total occlusion of mid RCA with L to R collaterals, 90% stenosis diffusely in prox branch of RCA followed by 70% stenosis in secondary curve & 80% stenosis in small marginal branch ([name])  . CARDIAC CATHETERIZATION  05/28/2008   normal L main, RCA with 100% prox lesion w/distal filling from LAD collaterals, LAD with 20% prox tubular lesion/ 40% mid LAD lesion/previous stent patent ([name])  . CARDIAC CATHETERIZATION  09/15/2009   discrete 100% osital RCA lesion, 50% prox LAD lesion, non-obstructive disease in all coronaries (Dr.  H. Elisabeth Cara)  . CESAREAN SECTION    . COLONOSCOPY    . COLONOSCOPY W/ POLYPECTOMY    . CORONARY ANGIOPLASTY WITH STENT PLACEMENT  10/31/2007   PCI of distal Cfx with 2.25x50m Taxus Caylen [place] DES, 60% narrowing of mid LAD (Dr. TCorky Downs  . CORONARY ANGIOPLASTY WITH STENT PLACEMENT  06/11/2011   PCI of prox-mid LAD with 3x172mDES Resolute (Dr. T.[name] . CORONARY STENT INTERVENTION N/A 06/19/2019   Procedure: CORONARY STENT INTERVENTION;  Surgeon: BeLorretta HarpMD;  [facility];  Service: Cardiovascular;  Laterality: N/A;   LAD  . DILATION AND CURETTAGE, DIAGNOSTIC / THERAPEUTIC    . DILITATION & CURRETTAGE/HYSTROSCOPY WITH HYDROTHERMAL ABLATION N/A 04/03/2016   Procedure: DILATATION & CURETTAGE/HYSTEROSCOPY WITH HYDROTHERMAL ABLATION;  Surgeon: ChShelly BombardMD;  [facility];  Service: Gynecology;  Laterality: N/A;  . NM MYOCAR PERF WALL MOTION  08/2009   persantine myoview - normal perfusion in all regions, perfusion defect in anterior region (breast attenuation), EF 52%, low risk scan  . RIGHT/LEFT HEART CATH AND CORONARY ANGIOGRAPHY N/A 06/15/2019   Procedure: RIGHT/LEFT HEART CATH AND CORONARY ANGIOGRAPHY;  Surgeon: SmBelva CromeMD;  [facility];  Service: Cardiovascular;  Laterality: N/A;     OB History    Gravida  8   Para      Term      Preterm      AB  6   Living  2     SAB  6   IAB      Ectopic      Multiple      Live Births  2           Family History  Problem Relation Age of Onset  . Leukemia Mother   . Clotting disorder Father        blood clot  . Hypertension Sister   . Diabetes Sister   . Stroke Sister 4076. Bleeding Disorder Son        ITP "free bleeding disorder"  . Colon cancer Paternal Grandmother   . Diabetes Paternal Grandmother   . Stomach cancer Neg Hx   . Pancreatic cancer Neg Hx     Social History   Tobacco Use  . Smoking status: Current Every Day Smoker    Packs/day: 0.50    Years: 34.00    Pack years: 17.00    Types: Cigarettes  . Smokeless tobacco: Never Used  Vaping Use  . Vaping Use: Never used  Substance Use Topics  . Alcohol use: No    Alcohol/week: 0.0 standard drinks    Comment: seldom  . Drug use: Yes    Types: Marijuana    Comment: 06/19/19    Home Medications Prior to Admission medications   Medication Sig Start Date End Date Taking? Authorizing Provider  acetaminophen (TYLENOL) 500 MG tablet TAKE 2 TABLETS BY MOUTH EVERY 6 HOURS AS NEEDED 10/09/20 10/09/21  AvNolene EbbsMD  ACETAMINOPHEN  EXTRA STRENGTH 500 MG tablet Take 1,000 mg by mouth every 6 (six) hours as needed. 10/09/20   [provider]  albuterol (PROVENTIL HFA;VENTOLIN HFA) 108 (90 BASE) MCG/ACT inhaler Inhale 2 puffs into the lungs every 6 (six) hours as needed for wheezing or shortness of breath.    [provider]  albuterol (PROVENTIL) (2.5 MG/3ML) 0.083% nebulizer solution Take 6 mLs by nebulization every 6 (six) hours as needed for wheezing or shortness of breath. 03/27/19  [provider]  albuterol (PROVENTIL) (2.5 MG/3ML) 0.083% nebulizer solution INHALE 1 VIAL VIA NEBULIZER EVERY 6 HOURS AS NEEDED 02/21/21 02/21/22  Nolene Ebbs, MD  albuterol (PROVENTIL) (2.5 MG/3ML) 0.083% nebulizer solution USE 1 VIAL VIA NEBULIZER EVERY 6 HOURS AS NEEDED 06/18/20 06/18/21  Nolene Ebbs, MD  albuterol (VENTOLIN HFA) 108 (90 Base) MCG/ACT inhaler INHALE 2 PUFFS EVERY 4 HOURS AS NEEDED 02/21/21 02/21/22  Nolene Ebbs, MD  amoxicillin-clavulanate (AUGMENTIN) 875-125 MG tablet TAKE 1 TABLET BY MOUTH TWO TIMES DAILY 10/09/20 10/09/21  Nolene Ebbs, MD  aspirin EC 81 MG EC tablet Take 1 tablet (81 mg total) by mouth daily. 06/21/19   Kayleen Memos, DO  atorvastatin (LIPITOR) 40 MG tablet Take 40 mg by mouth daily.    [provider]  atorvastatin (LIPITOR) 80 MG tablet TAKE 1 TABLET BY MOUTH ONCE A DAY 12/18/20 12/18/21  Nolene Ebbs, MD  benzonatate (TESSALON) 100 MG capsule Take 200 mg by mouth every 8 (eight) hours as needed. 11/08/20   [provider]  benzonatate (TESSALON) 100 MG capsule TAKE 1 CAPSULE BY MOUTH EVERY 8 HOURS AS NEEDED FOR COUGH AND CONGESTION 06/18/20 06/18/21  Nolene Ebbs, MD  cefixime (SUPRAX) 400 MG CAPS capsule TAKE 2 CAPSULES BY MOUTH FOR SINGLE DOSE 09/24/20 09/24/21  Nolene Ebbs, MD  cephALEXin (KEFLEX) 500 MG capsule TAKE 1 CAPSULE BY MOUTH FOUR TIMES DAILY 10/10/20 10/10/21  Nolene Ebbs, MD  cetirizine (ZYRTEC) 10 MG tablet TAKE 1 TABLET BY MOUTH ONCE A DAY  AS NEEDED 02/21/21 02/21/22  Nolene Ebbs, MD  clopidogrel (PLAVIX) 75 MG tablet Take 1 tablet (75 mg total) by mouth daily with breakfast. 06/20/19   Kayleen Memos, DO  clopidogrel (PLAVIX) 75 MG tablet TAKE 1 TABLET BY MOUTH ONCE A DAY 12/18/20 12/18/21  Nolene Ebbs, MD  clopidogrel (PLAVIX) 75 MG tablet TAKE 1 TABLET BY MOUTH ONCE DAILY 06/18/20 06/18/21  Nolene Ebbs, MD  cyclobenzaprine (FLEXERIL) 10 MG tablet Take 1 tablet (10 mg total) by mouth 2 (two) times daily as needed for muscle spasms. 08/22/20   Domenic Moras, PA-C  dapagliflozin propanediol (FARXIGA) 10 MG TABS tablet TAKE 1 TABLET BY MOUTH DAILY BEFORE BREAKFAST 10/21/20 10/21/21  Bensimhon, Shaune Pascal, MD  diclofenac Sodium (VOLTAREN) 1 % GEL APPLY 4 GRAMS FOUR TIMES DAILY AS NEEDED FOR PAINS AS DIRECTED 09/11/20 09/11/21  Nolene Ebbs, MD  doxycycline (VIBRA-TABS) 100 MG tablet TAKE 1 TABLET BY MOUTH 2 TIMES DAILY 12/04/20 12/04/21  Nolene Ebbs, MD  Evolocumab 140 MG/ML SOAJ INJECT 1 DOSE INTO THE SKIN EVERY 14 (FOURTEEN) DAYS. 01/29/21 01/29/22  Pixie Casino, MD  fluticasone-salmeterol (ADVAIR HFA) 115-21 MCG/ACT inhaler Inhale 2 puffs into the lungs as needed.    [provider]  furosemide (LASIX) 40 MG tablet TAKE 1 TABLET BY MOUTH DAILY. 10/21/20 10/21/21  Bensimhon, Shaune Pascal, MD  hydrALAZINE (APRESOLINE) 50 MG tablet TAKE 1 TABLET BY MOUTH 3 TIMES DAILY. 12/16/20 12/16/21  Rafael Bihari, FNP  isosorbide mononitrate (IMDUR) 30 MG 24 hr tablet Take 1 tablet (30 mg total) by mouth daily. 06/21/19   Kayleen Memos, DO  isosorbide mononitrate (IMDUR) 30 MG 24 hr tablet TAKE 1 TABLET BY MOUTH ONCE A DAY 12/18/20 12/18/21  Nolene Ebbs, MD  mupirocin ointment (BACTROBAN) 2 % APPLY TO SKIN TWO TIMES DAILY AS DIRECTED 09/11/20 09/11/21  Nolene Ebbs, MD  NARCAN 4 MG/0.1ML LIQD nasal spray kit Place 1 spray into the nose once.  09/01/18   [provider]  nebivolol (BYSTOLIC) 10 MG tablet Take 10 mg by mouth daily.  Patient not taking Patient not taking: No sig reported    [provider]  nitroGLYCERIN (NITROSTAT) 0.4 MG SL tablet Place 1 tablet (0.4 mg total) under the tongue every 5 (five) minutes as needed for chest pain. 01/14/18   [name], MD  nitroGLYCERIN (NITROSTAT) 0.4 MG SL tablet DISSOLVE 1 TABLET UNDER THE TONGUE AS NEEDED AS DIRECTED 12/18/20 12/18/21  Nolene Ebbs, MD  ofloxacin (OCUFLOX) 0.3 % ophthalmic solution Instill 1 drop TID in operative eye starting 2 days prior to surgery and after surgery for 3 weeks. 11/29/20   [provider]  ofloxacin (OCUFLOX) 0.3 % ophthalmic solution INSTILL 1 DROP 3 TIMES DAILY IN OPERATIVE EYE STARTING 2 DAYS PRIOR TO SURGERY AND AFTER SURGERY FOR 3 WEEKS. 11/29/20 11/29/21  Rutherford Guys, MD  potassium chloride (KLOR-CON) 10 MEQ tablet TAKE 2 TABLETS BY MOUTH 2 TIMES DAILY 10/25/20 10/25/21  Bensimhon, Shaune Pascal, MD  prednisoLONE acetate (PRED FORTE) 1 % ophthalmic suspension One drop in operative eye(s) TID starting 2 days before surgery and after surgery for 3 weeks 11/29/20   [provider]  prednisoLONE acetate (PRED FORTE) 1 % ophthalmic suspension APPLY ONE DROP IN OPERATIVE EYE(S) 3 TIMES DAILY STARTING 2 DAYS BEFORE SURGERY AND AFTER SURGERY FOR 3 WEEKS 11/29/20 11/29/21  Rutherford Guys, MD  predniSONE (DELTASONE) 20 MG tablet TAKE 2 TABLETS BY MOUTH DAILY FOR 3 DAYS THEN TAKE 1 TAB DAILY FOR 4 DAYS 11/19/20 11/19/21  Nolene Ebbs, MD  predniSONE (DELTASONE) 20 MG tablet TAKE 2 TABLETS (40 MG TOTAL) BY MOUTH DAILY. 11/09/20 11/09/21  Montine Circle, PA-C  sacubitril-valsartan (ENTRESTO) 97-103 MG Take 1 tablet by mouth 2 (two) times daily. 10/21/20   Bensimhon, Shaune Pascal, MD  spironolactone (ALDACTONE) 25 MG tablet TAKE 1 TABLET BY MOUTH DAILY 10/21/20 10/21/21  Bensimhon, Shaune Pascal, MD  Vitamin D, Ergocalciferol, (DRISDOL) 1.25 MG (50000 UNIT) CAPS capsule TAKE 1 CAPSULE BY MOUTH ONCE A WEEK 02/21/21 02/21/22  Nolene Ebbs, MD   VOLTAREN 1 % GEL SMARTSIG:4 Gram(s) Topical 4 Times Daily PRN 09/22/19   [provider]  zolpidem (AMBIEN) 10 MG tablet Take 10 mg by mouth at bedtime as needed for sleep.    [provider]  zolpidem (AMBIEN) 10 MG tablet TAKE 1 TABLET BY MOUTH AT BEDTIME AS NEEDED FOR SLEEP 02/21/21 08/20/21  Nolene Ebbs, MD  zolpidem (AMBIEN) 10 MG tablet TAKE 1 TABLET BY MOUTH AT BEDTIME AS NEEDED FOR SLEEP 01/29/21 07/28/21  Nolene Ebbs, MD  zolpidem (AMBIEN) 10 MG tablet TAKE 1 TABLET BY MOUTH AT BEDTIME AS NEEDED FOR SLEEP 06/18/20 12/15/20  Nolene Ebbs, MD    Allergies    Coreg [carvedilol], Other, Amoxicillin, Dilaudid [hydromorphone hcl], Ibuprofen, Latex, and Metoprolol  Review of Systems   Review of Systems  Constitutional: Negative for chills and fever.  HENT: Negative for ear pain and sore throat.   Eyes: Negative for pain and visual disturbance.  Respiratory: Positive for cough, shortness of breath and wheezing. Negative for sputum production.   Cardiovascular: Positive for chest pain. Negative for palpitations and leg swelling.  Gastrointestinal: Negative for abdominal pain and vomiting.  Genitourinary: Negative for dysuria and hematuria.  Musculoskeletal: Negative for arthralgias and back pain.  Skin: Negative for color change and rash.  Neurological: Negative for seizures and syncope.  All other systems reviewed and are negative.   Physical Exam Updated Vital Signs BP (!) 123/93  Pulse 88   Temp 97.9 F (36.6 C) (Oral)   Resp 20   Ht 5' 1"  (1.549 m)   Wt 89.4 kg   LMP  (LMP Unknown)   SpO2 94%   BMI 37.22 kg/m   Physical Exam Vitals and nursing note reviewed.  Constitutional:      General: She is not in acute distress.    Appearance: She is well-developed. She is not ill-appearing.  HENT:     Head: Normocephalic and atraumatic.  Eyes:     Conjunctiva/sclera: Conjunctivae normal.     Pupils: Pupils are equal, round, and reactive to light.   Cardiovascular:     Rate and Rhythm: Normal rate and regular rhythm.     Pulses: Normal pulses.     Heart sounds: Normal heart sounds. No murmur heard.   Pulmonary:     Effort: Tachypnea present. No respiratory distress.     Breath sounds: Decreased breath sounds and wheezing present.  Abdominal:     Palpations: Abdomen is soft.     Tenderness: There is no abdominal tenderness.  Musculoskeletal:     Cervical back: Normal range of motion and neck supple.     Right lower leg: Edema (trace) present.     Left lower leg: Edema (trace) present.  Skin:    General: Skin is warm and dry.     Capillary Refill: Capillary refill takes less than 2 seconds.  Neurological:     General: No focal deficit present.     Mental Status: She is alert.  Psychiatric:        Mood and Affect: Mood normal.     ED Results / Procedures / Treatments   Labs (all labs ordered are listed, but only abnormal results are displayed) Labs Reviewed  CBC WITH DIFFERENTIAL/PLATELET - Abnormal; Notable for the following components:      Result Value   RDW 16.1 (*)    All other components within normal limits  BASIC METABOLIC PANEL - Abnormal; Notable for the following components:   Potassium 3.1 (*)    Glucose, Bld 124 (*)    Creatinine, Ser 1.21 (*)    GFR, Estimated 52 (*)    All other components within normal limits  BRAIN NATRIURETIC PEPTIDE - Abnormal; Notable for the following components:   B Natriuretic Peptide 398.4 (*)    All other components within normal limits  TROPONIN I (HIGH SENSITIVITY) - Abnormal; Notable for the following components:   Troponin I (High Sensitivity) 23 (*)    All other components within normal limits  RESP PANEL BY RT-PCR (FLU A&B, COVID) ARPGX2  HEPATIC FUNCTION PANEL  TROPONIN I (HIGH SENSITIVITY)    EKG EKG Interpretation  Date/Time:  Sunday February 23 2021 11:43:15 EDT Ventricular Rate:  87 PR Interval:  170 QRS Duration: 98 QT Interval:  403 QTC  Calculation: 485 R Axis:   52 Text Interpretation: Sinus rhythm Probable left atrial enlargement Confirmed by Lennice Sites ([id number]) on 02/23/2021 11:55:07 AM   Radiology DG Chest Portable 1 View  Result Date: 02/23/2021 CLINICAL DATA:  Shortness of breath and chest pain. Rales right lower lobe. EXAM: PORTABLE CHEST 1 VIEW COMPARISON:  Multiple priors, most recent 11/09/2020. FINDINGS: Enlarged cardiac silhouette. Kerley B lines at the right lateral lung base. Streaky bibasilar opacities. No consolidation. No visible pleural effusions or pneumothorax on this single AP portable radiograph. The visualized skeletal structures are unremarkable. Polyarticular degenerative change. IMPRESSION: 1. Kerley B lines at the right lateral lung base,  suggestive of mild interstitial edema. 2. Streaky bibasilar opacities, favor atelectasis. No consolidation. 3. Cardiomegaly. Electronically Signed   By: Margaretha Sheffield MD   On: 02/23/2021 12:12    Procedures Procedures   Medications Ordered in ED Medications  furosemide (LASIX) injection 40 mg (has no administration in time range)  morphine 2 MG/ML injection 2 mg (has no administration in time range)  albuterol (VENTOLIN HFA) 108 (90 Base) MCG/ACT inhaler 6 puff (6 puffs Inhalation Given 02/23/21 1215)  ipratropium (ATROVENT HFA) inhaler 2 puff (2 puffs Inhalation Given 02/23/21 1215)  methylPREDNISolone sodium succinate (SOLU-MEDROL) 125 mg/2 mL injection 125 mg (125 mg Intravenous Given 02/23/21 1214)  fentaNYL (SUBLIMAZE) injection 50 mcg (50 mcg Intravenous Given 02/23/21 1214)    ED Course  I have reviewed the triage vital signs and the nursing notes.  Pertinent labs & imaging results that were available during my care of the patient were reviewed by me and considered in my medical decision making (see chart for details).    MDM Rules/Calculators/A&P                          Amreen Raczkowski is a 60 year old female with history of COPD, CAD, heart failure  who presents the ED with shortness of breath.  Patient with unremarkable vitals.  No fever.  Has been using home inhaler without much relief.  Has missed some Lasix doses.  However she states that she has not had major leg swelling like she has when she gets fluid overloaded.  Patient wears oxygen as needed.  Denies any sputum production or fever.  Does not appear to be having any cardiac sounding chest pain.  EKG shows sinus rhythm.  No ischemic changes.  Will check basic labs including BNP, troponin, chest x-ray, Covid test.  Will give breathing treatment and IV Solu-Medrol.  Does not appear to be largely volume overloaded but will check chest x-ray and BNP.  May need to diuresis as well.  BNP and troponin mildly elevated.  Chest x-ray consistent with edema.  Overall suspect multifactorial shortness of breath, increased oxygen need.  Will give a dose of IV Lasix.  Believe she would benefit from diuresis and may be some further steroids for COPD.  May need new echocardiogram.  Seems less likely to be ACS.  Will admit to medicine for observation stay.  This chart was dictated using voice recognition software.  Despite best efforts to proofread,  errors can occur which can change the documentation meaning.    Final Clinical Impression(s) / ED Diagnoses Final diagnoses:  Acute respiratory failure with hypoxia (HCC)  Acute on chronic congestive heart failure, unspecified heart failure type (HCC)  Shortness of breath    Rx / DC Orders ED Discharge Orders    None       Lennice Sites, DO 02/23/21 1357

## 2021-02-23 NOTE — ED Notes (Signed)
Attempted to call report, nurse to call me back 

## 2021-02-23 NOTE — ED Notes (Signed)
Pt moved to floor via EDT via stretcher

## 2021-02-23 NOTE — ED Triage Notes (Signed)
Saw PCP on Friday, refused to go to hosp, frequent albuterol treatments at home, increase chest pain in center of chest on deep inspiration, rales RLL, hx of CHF. Smoker, last smoked Friday, smokes half ppd, dx with CHF and COPD, HTN

## 2021-02-23 NOTE — H&P (Signed)
History and Physical  Patient Name: Tracey Manning     EYC:[id number]    DOB: 24-Sep-1961    DOA: 02/23/2021 PCP: Nolene Ebbs, MD  Patient coming from: Home  Chief Complaint: Dyspnea    HPI: Tracey Manning is a 60 y.o. female, with PMH of CAD, COPD, OSA, borderline type 2 diabetes, hypertension, dyslipidemia, morbid obesity, combined CHF, who presented to the ER on 02/23/2021 with worsening dyspnea.  Patient states that since Friday she has had progressive worsening of dyspnea.  She saw her PCP on Friday who recommended going to the hospital however patient declined at the time.  She has tried home breathing treatments with no significant improvement.  She has required O2 supplementation at home, typically only uses as needed.  She had significant coughing and inability to sleep.  She says she has not been tolerating her home CPAP machine.  She continues to smoke but says she has not smoked since Friday because she said inability to breathe.  She says she takes Lasix at home but based on history it seems like it is not scheduled.  She is followed by cardiology outpatient for CHF.  She says she has been compliant with her home medications.  She is concerned this is a CHF exacerbation because her breathing treatments have not improved her symptoms.  She says she does not swell in her lower extremities when she has fluid on her.  Last echocardiogram I can see was in July 2021 which showed an EF of 14-97% with diastolic dysfunction.  Due to her worsening dyspnea, she presented to the hospital for further evaluation.    ED course: -Vitals on admission: Afebrile, heart rate 83, blood pressure 144/95, sats in the low 90s on nasal cannula -Labs on initial presentation: Sodium 141, potassium 3.1, chloride 107, bicarb 28, glucose 124, BUN 17, creatinine 1.2, BNP 400, troponin 23, WBC 7.2, hemoglobin 12.5, Covid negative -Imaging obtained on admission: Chest x-ray showed findings of volume overload,  cardiomegaly -In the ED the patient was given fentanyl, Lasix 40 mg IV, albuterol, Solu-Medrol 125 mg, morphine, and the hospitalist service was contacted for further evaluation and management.     ROS: A complete and thorough 12 point review of systems obtained, negative listed in HPI.     Past Medical History:  Diagnosis Date  . Anxiety   . Arthritis   . Asthma   . Barrett's esophagus   . Chest pain   . Collagen vascular disease ([place])   . Colon polyps   . Coronary artery disease   . Depression   . Dyslipidemia   . GERD (gastroesophageal reflux disease)    occ  . Hyperlipidemia   . Hypertension   . Myocardial infarct ([place]) [phone]  . Sciatica   . Sleep apnea    no CPAP ordered but using oxygen at bedtime  . Tobacco abuse     Past Surgical History:  Procedure Laterality Date  . CARDIAC CATHETERIZATION  10/09/2003   [place], [place]) - LAD with 30% prox narrowing, 50% stenosis in mid-portion of PLA; RCA with 40% narrowing proximally ([name], [name])  . CARDIAC CATHETERIZATION  10/10/2007   70% stenosis in first septal perforator branch of LAD, 60-70% narrowing in mid LAD, 20% narrowing in mid AV groove Cfx with 80% diffuse narrowing in small distal marginal, total occlusion of mid RCA with L to R collaterals, 90% stenosis diffusely in prox branch of RCA followed by 70% stenosis in secondary curve &  80% stenosis in small marginal branch ([name])  . CARDIAC CATHETERIZATION  05/28/2008   normal L main, RCA with 100% prox lesion w/distal filling from LAD collaterals, LAD with 20% prox tubular lesion/ 40% mid LAD lesion/previous stent patent ([name])  . CARDIAC CATHETERIZATION  09/15/2009   discrete 100% osital RCA lesion, 50% prox LAD lesion, non-obstructive disease in all coronaries ([name])  . CESAREAN SECTION    . COLONOSCOPY    . COLONOSCOPY W/ POLYPECTOMY    . CORONARY ANGIOPLASTY WITH STENT PLACEMENT  10/31/2007   PCI of  distal Cfx with 2.25x87m Taxus Adam DES, 60% narrowing of mid LAD (Dr. TCorky Downs  . CORONARY ANGIOPLASTY WITH STENT PLACEMENT  06/11/2011   PCI of prox-mid LAD with 3x136mDES Resolute (Dr. T.[name] . CORONARY STENT INTERVENTION N/A 06/19/2019   Procedure: CORONARY STENT INTERVENTION;  Surgeon: BeLorretta HarpMD;  Location: [facility];  Service: Cardiovascular;  Laterality: N/A;  LAD  . DILATION AND CURETTAGE, DIAGNOSTIC / THERAPEUTIC    . DILITATION & CURRETTAGE/HYSTROSCOPY WITH HYDROTHERMAL ABLATION N/A 04/03/2016   Procedure: DILATATION & CURETTAGE/HYSTEROSCOPY WITH HYDROTHERMAL ABLATION;  Surgeon: ChShelly BombardMD;  [facility];  Service: Gynecology;  Laterality: N/A;  . NM MYOCAR PERF WALL MOTION  08/2009   persantine myoview - normal perfusion in all regions, perfusion defect in anterior region (breast attenuation), EF 52%, low risk scan  . RIGHT/LEFT HEART CATH AND CORONARY ANGIOGRAPHY N/A 06/15/2019   Procedure: RIGHT/LEFT HEART CATH AND CORONARY ANGIOGRAPHY;  Surgeon: SmBelva CromeMD;  [facility];  Service: Cardiovascular;  Laterality: N/A;    Social History: Patient lives at home.  The patient walks without assistance.  Current smoker.  Allergies  Allergen Reactions  . Coreg [Carvedilol] Other (See Comments)    Headache  . Other Anaphylaxis and Hives    Fresh strawberries (throat swelled)  . Amoxicillin Nausea And Vomiting    Did it involve swelling of the face/tongue/throat, SOB, or low BP? Yes Did it involve sudden or severe rash/hives, skin peeling, or any reaction on the inside of your mouth or nose? Yes Did you need to seek medical attention at a hospital or doctor's office? Yes When did it last happen?within last 10 years If all above answers are "NO", may proceed with cephalosporin use.   . Dilaudid [Hydromorphone Hcl] Nausea And Vomiting  . Ibuprofen Other (See Comments)    wheezing  . Latex Swelling    No reaction with  bandaids  . Metoprolol     HEADACHE and Wheezing    Family history: family history includes Bleeding Disorder in her son; Clotting disorder in her father; Colon cancer in her paternal grandmother; Diabetes in her paternal grandmother and sister; Hypertension in her sister; Leukemia in her mother; Stroke (age of onset: 4044in her sister.  Prior to Admission medications   Medication Sig Start Date End Date Taking? Authorizing Provider  acetaminophen (TYLENOL) 500 MG tablet TAKE 2 TABLETS BY MOUTH EVERY 6 HOURS AS NEEDED 10/09/20 10/09/21  AvNolene EbbsMD  ACETAMINOPHEN EXTRA STRENGTH 500 MG tablet Take 1,000 mg by mouth every 6 (six) hours as needed. 10/09/20   [provider]  albuterol (PROVENTIL HFA;VENTOLIN HFA) 108 (90 BASE) MCG/ACT inhaler Inhale 2 puffs into the lungs every 6 (six) hours as needed for wheezing or shortness of breath.    [provider]  albuterol (PROVENTIL) (2.5 MG/3ML) 0.083% nebulizer solution Take  6 mLs by nebulization every 6 (six) hours as needed for wheezing or shortness of breath. 03/27/19   [provider]  albuterol (PROVENTIL) (2.5 MG/3ML) 0.083% nebulizer solution INHALE 1 VIAL VIA NEBULIZER EVERY 6 HOURS AS NEEDED 02/21/21 02/21/22  Nolene Ebbs, MD  albuterol (PROVENTIL) (2.5 MG/3ML) 0.083% nebulizer solution USE 1 VIAL VIA NEBULIZER EVERY 6 HOURS AS NEEDED 06/18/20 06/18/21  Nolene Ebbs, MD  albuterol (VENTOLIN HFA) 108 (90 Base) MCG/ACT inhaler INHALE 2 PUFFS EVERY 4 HOURS AS NEEDED 02/21/21 02/21/22  Nolene Ebbs, MD  amoxicillin-clavulanate (AUGMENTIN) 875-125 MG tablet TAKE 1 TABLET BY MOUTH TWO TIMES DAILY 10/09/20 10/09/21  Nolene Ebbs, MD  aspirin EC 81 MG EC tablet Take 1 tablet (81 mg total) by mouth daily. 06/21/19   Kayleen Memos, DO  atorvastatin (LIPITOR) 40 MG tablet Take 40 mg by mouth daily.    [provider]  atorvastatin (LIPITOR) 80 MG tablet TAKE 1 TABLET BY MOUTH ONCE A DAY 12/18/20 12/18/21  Nolene Ebbs, MD  benzonatate (TESSALON) 100 MG capsule Take 200 mg by mouth every 8 (eight) hours as needed. 11/08/20   [provider]  benzonatate (TESSALON) 100 MG capsule TAKE 1 CAPSULE BY MOUTH EVERY 8 HOURS AS NEEDED FOR COUGH AND CONGESTION 06/18/20 06/18/21  Nolene Ebbs, MD  cefixime (SUPRAX) 400 MG CAPS capsule TAKE 2 CAPSULES BY MOUTH FOR SINGLE DOSE 09/24/20 09/24/21  Nolene Ebbs, MD  cephALEXin (KEFLEX) 500 MG capsule TAKE 1 CAPSULE BY MOUTH FOUR TIMES DAILY 10/10/20 10/10/21  Nolene Ebbs, MD  cetirizine (ZYRTEC) 10 MG tablet TAKE 1 TABLET BY MOUTH ONCE A DAY AS NEEDED 02/21/21 02/21/22  Nolene Ebbs, MD  clopidogrel (PLAVIX) 75 MG tablet Take 1 tablet (75 mg total) by mouth daily with breakfast. 06/20/19   Kayleen Memos, DO  clopidogrel (PLAVIX) 75 MG tablet TAKE 1 TABLET BY MOUTH ONCE A DAY 12/18/20 12/18/21  Nolene Ebbs, MD  clopidogrel (PLAVIX) 75 MG tablet TAKE 1 TABLET BY MOUTH ONCE DAILY 06/18/20 06/18/21  Nolene Ebbs, MD  cyclobenzaprine (FLEXERIL) 10 MG tablet Take 1 tablet (10 mg total) by mouth 2 (two) times daily as needed for muscle spasms. 08/22/20   Domenic Moras, PA-C  dapagliflozin propanediol (FARXIGA) 10 MG TABS tablet TAKE 1 TABLET BY MOUTH DAILY BEFORE BREAKFAST 10/21/20 10/21/21  Bensimhon, Shaune Pascal, MD  diclofenac Sodium (VOLTAREN) 1 % GEL APPLY 4 GRAMS FOUR TIMES DAILY AS NEEDED FOR PAINS AS DIRECTED 09/11/20 09/11/21  Nolene Ebbs, MD  doxycycline (VIBRA-TABS) 100 MG tablet TAKE 1 TABLET BY MOUTH 2 TIMES DAILY 12/04/20 12/04/21  Nolene Ebbs, MD  Evolocumab 140 MG/ML SOAJ INJECT 1 DOSE INTO THE SKIN EVERY 14 (FOURTEEN) DAYS. 01/29/21 01/29/22  Pixie Casino, MD  fluticasone-salmeterol (ADVAIR HFA) 115-21 MCG/ACT inhaler Inhale 2 puffs into the lungs as needed.    [provider]  furosemide (LASIX) 40 MG tablet TAKE 1 TABLET BY MOUTH DAILY. 10/21/20 10/21/21  Bensimhon, Shaune Pascal, MD  hydrALAZINE (APRESOLINE) 50 MG tablet TAKE 1 TABLET BY MOUTH 3  TIMES DAILY. 12/16/20 12/16/21  Rafael Bihari, FNP  isosorbide mononitrate (IMDUR) 30 MG 24 hr tablet Take 1 tablet (30 mg total) by mouth daily. 06/21/19   Kayleen Memos, DO  isosorbide mononitrate (IMDUR) 30 MG 24 hr tablet TAKE 1 TABLET BY MOUTH ONCE A DAY 12/18/20 12/18/21  Nolene Ebbs, MD  mupirocin ointment (BACTROBAN) 2 % APPLY TO SKIN TWO TIMES DAILY AS DIRECTED 09/11/20 09/11/21  Nolene Ebbs, MD  NARCAN 4 MG/0.1ML LIQD nasal spray kit Place 1 spray into the nose once.  09/01/18   [provider]  nebivolol (BYSTOLIC) 10 MG tablet Take 10 mg by mouth daily. Patient not taking Patient not taking: No sig reported    [provider]  nitroGLYCERIN (NITROSTAT) 0.4 MG SL tablet Place 1 tablet (0.4 mg total) under the tongue every 5 (five) minutes as needed for chest pain. 01/14/18   [name], MD  nitroGLYCERIN (NITROSTAT) 0.4 MG SL tablet DISSOLVE 1 TABLET UNDER THE TONGUE AS NEEDED AS DIRECTED 12/18/20 12/18/21  Nolene Ebbs, MD  ofloxacin (OCUFLOX) 0.3 % ophthalmic solution Instill 1 drop TID in operative eye starting 2 days prior to surgery and after surgery for 3 weeks. 11/29/20   [provider]  ofloxacin (OCUFLOX) 0.3 % ophthalmic solution INSTILL 1 DROP 3 TIMES DAILY IN OPERATIVE EYE STARTING 2 DAYS PRIOR TO SURGERY AND AFTER SURGERY FOR 3 WEEKS. 11/29/20 11/29/21  Rutherford Guys, MD  potassium chloride (KLOR-CON) 10 MEQ tablet TAKE 2 TABLETS BY MOUTH 2 TIMES DAILY 10/25/20 10/25/21  Bensimhon, Shaune Pascal, MD  prednisoLONE acetate (PRED FORTE) 1 % ophthalmic suspension One drop in operative eye(s) TID starting 2 days before surgery and after surgery for 3 weeks 11/29/20   [provider]  prednisoLONE acetate (PRED FORTE) 1 % ophthalmic suspension APPLY ONE DROP IN OPERATIVE EYE(S) 3 TIMES DAILY STARTING 2 DAYS BEFORE SURGERY AND AFTER SURGERY FOR 3 WEEKS 11/29/20 11/29/21  Rutherford Guys, MD  predniSONE (DELTASONE) 20 MG tablet TAKE 2 TABLETS BY MOUTH DAILY  FOR 3 DAYS THEN TAKE 1 TAB DAILY FOR 4 DAYS 11/19/20 11/19/21  Nolene Ebbs, MD  predniSONE (DELTASONE) 20 MG tablet TAKE 2 TABLETS (40 MG TOTAL) BY MOUTH DAILY. 11/09/20 11/09/21  Montine Circle, PA-C  sacubitril-valsartan (ENTRESTO) 97-103 MG Take 1 tablet by mouth 2 (two) times daily. 10/21/20   Bensimhon, Shaune Pascal, MD  spironolactone (ALDACTONE) 25 MG tablet TAKE 1 TABLET BY MOUTH DAILY 10/21/20 10/21/21  Bensimhon, Shaune Pascal, MD  Vitamin D, Ergocalciferol, (DRISDOL) 1.25 MG (50000 UNIT) CAPS capsule TAKE 1 CAPSULE BY MOUTH ONCE A WEEK 02/21/21 02/21/22  Nolene Ebbs, MD  VOLTAREN 1 % GEL SMARTSIG:4 Gram(s) Topical 4 Times Daily PRN 09/22/19   [provider]  zolpidem (AMBIEN) 10 MG tablet Take 10 mg by mouth at bedtime as needed for sleep.    [provider]  zolpidem (AMBIEN) 10 MG tablet TAKE 1 TABLET BY MOUTH AT BEDTIME AS NEEDED FOR SLEEP 02/21/21 08/20/21  Nolene Ebbs, MD  zolpidem (AMBIEN) 10 MG tablet TAKE 1 TABLET BY MOUTH AT BEDTIME AS NEEDED FOR SLEEP 01/29/21 07/28/21  Nolene Ebbs, MD  zolpidem (AMBIEN) 10 MG tablet TAKE 1 TABLET BY MOUTH AT BEDTIME AS NEEDED FOR SLEEP 06/18/20 12/15/20  Nolene Ebbs, MD       Physical Exam: BP (!) 123/93   Pulse 88   Temp 97.9 F (36.6 C) (Oral)   Resp 20   Ht _0  (1.549 m)   Wt 89.4 kg   LMP  (LMP Unknown)   SpO2 94%   BMI 37.22 kg/m   General appearance: Well-developed, adult female, alert and in moderate respiratory distress Eyes: Anicteric, conjunctiva pink, lids and lashes normal. PERRL.    ENT: No nasal deformity, discharge, epistaxis.  Hearing intact. OP moist without lesions.   Neck: No neck masses.  Trachea midline.  No thyromegaly/tenderness. Lymph: No cervical or supraclavicular lymphadenopathy. Skin: Warm and dry.  No  jaundice.  No suspicious rashes or lesions. Cardiac:  Tachycardic, nl S1-S2, no murmurs appreciated.    Trace LE edema.  Radial and pedal pulses 2+ and symmetric. Respiratory:   Moderate respiratory distress, coarse breath sounds bilaterally, slight expiratory wheezing Abdomen: Abdomen soft.  No tenderness with palpation. No ascites, distension, hepatosplenomegaly.   MSK: No deformities or effusions of the large joints of the upper or lower extremities bilaterally.  No cyanosis or clubbing. Neuro: Cranial nerves 2 through 12 grossly intact.  Sensation intact to light touch. Speech is fluent.  Marland Kitchen    Psych: Sensorium intact and responding to questions, attention normal.  Behavior appropriate.  Judgment and insight appear normal.    Labs on Admission:  I have personally reviewed following labs and imaging studies: CBC: Recent Labs  Lab 02/23/21 1158  WBC 7.2  NEUTROABS 5.5  HGB 12.5  HCT 37.2  MCV 84.9  PLT 643   Basic Metabolic Panel: Recent Labs  Lab 02/23/21 1158  NA 141  K 3.1*  CL 107  CO2 28  GLUCOSE 124*  BUN 17  CREATININE 1.21*  CALCIUM 9.3   GFR: Estimated Creatinine Clearance: 50.9 mL/min (A) (by C-G formula based on SCr of 1.21 mg/dL (H)).  Liver Function Tests: Recent Labs  Lab 02/23/21 1158  AST 16  ALT 19  ALKPHOS 66  BILITOT 0.7  PROT 6.5  ALBUMIN 3.5   No results for input(s): LIPASE, AMYLASE in the last 168 hours. No results for input(s): AMMONIA in the last 168 hours. Coagulation Profile: No results for input(s): INR, PROTIME in the last 168 hours. Cardiac Enzymes: No results for input(s): CKTOTAL, CKMB, CKMBINDEX, TROPONINI in the last 168 hours. BNP (last 3 results) No results for input(s): PROBNP in the last 8760 hours. HbA1C: No results for input(s): HGBA1C in the last 72 hours. CBG: No results for input(s): GLUCAP in the last 168 hours. Lipid Profile: No results for input(s): CHOL, HDL, LDLCALC, TRIG, CHOLHDL, LDLDIRECT in the last 72 hours. Thyroid Function Tests: No results for input(s): TSH, T4TOTAL, FREET4, T3FREE, THYROIDAB in the last 72 hours. Anemia Panel: No results for input(s): VITAMINB12, FOLATE,  FERRITIN, TIBC, IRON, RETICCTPCT in the last 72 hours.   Recent Results (from the past 240 hour(s))  Resp Panel by RT-PCR (Flu A&B, Covid) Nasopharyngeal Swab     Status: None   Collection Time: 02/23/21  1:05 PM   Specimen: Nasopharyngeal Swab; Nasopharyngeal(NP) swabs in vial transport medium  Result Value Ref Range Status   SARS Coronavirus 2 by RT PCR NEGATIVE NEGATIVE Final    Comment: (NOTE) SARS-CoV-2 target nucleic acids are NOT DETECTED.  The SARS-CoV-2 RNA is generally detectable in upper respiratory specimens during the acute phase of infection. The lowest concentration of SARS-CoV-2 viral copies this assay can detect is 138 copies/mL. A negative result does not preclude SARS-Cov-2 infection and should not be used as the sole basis for treatment or other patient management decisions. A negative result may occur with  improper specimen collection/handling, submission of specimen other than nasopharyngeal swab, presence of viral mutation(s) within the areas targeted by this assay, and inadequate number of viral copies(<138 copies/mL). A negative result must be combined with clinical observations, patient history, and epidemiological information. The expected result is Negative.  Fact Sheet for Patients:  EntrepreneurPulse.com.au  Fact Sheet for Healthcare Providers:  IncredibleEmployment.be  This test is no t yet approved or cleared by the [place] FDA and  has been authorized for detection and/or diagnosis  of SARS-CoV-2 by FDA under an Emergency Use Authorization (EUA). This EUA will remain  in effect (meaning this test can be used) for the duration of the COVID-19 declaration under Section 564(b)(1) of the Act, 21 U.S.C.section 360bbb-3(b)(1), unless the authorization is terminated  or revoked sooner.       Influenza A by PCR NEGATIVE NEGATIVE Final   Influenza B by PCR NEGATIVE NEGATIVE Final    Comment: (NOTE) The Xpert  Xpress SARS-CoV-2/FLU/RSV plus assay is intended as an aid in the diagnosis of influenza from Nasopharyngeal swab specimens and should not be used as a sole basis for treatment. Nasal washings and aspirates are unacceptable for Xpert Xpress SARS-CoV-2/FLU/RSV testing.  Fact Sheet for Patients: EntrepreneurPulse.com.au  Fact Sheet for Healthcare Providers: IncredibleEmployment.be  This test is not yet approved or cleared by the [place] FDA and has been authorized for detection and/or diagnosis of SARS-CoV-2 by FDA under an Emergency Use Authorization (EUA). This EUA will remain in effect (meaning this test can be used) for the duration of the COVID-19 declaration under Section 564(b)(1) of the Act, 21 U.S.C. section 360bbb-3(b)(1), unless the authorization is terminated or revoked.  Performed at [facility], [place] [address] [place],  [address]            Radiological Exams on Admission: Personally reviewed imaging which shows: Chest x-ray shows findings consistent with volume overload, no consolidation, cardiomegaly DG Chest Portable 1 View  Result Date: 02/23/2021 CLINICAL DATA:  Shortness of breath and chest pain. Rales right lower lobe. EXAM: PORTABLE CHEST 1 VIEW COMPARISON:  Multiple priors, most recent 11/09/2020. FINDINGS: Enlarged cardiac silhouette. Kerley B lines at the right lateral lung base. Streaky bibasilar opacities. No consolidation. No visible pleural effusions or pneumothorax on this single AP portable radiograph. The visualized skeletal structures are unremarkable. Polyarticular degenerative change. IMPRESSION: 1. Kerley B lines at the right lateral lung base, suggestive of mild interstitial edema. 2. Streaky bibasilar opacities, favor atelectasis. No consolidation. 3. Cardiomegaly. Electronically Signed   By: Margaretha Sheffield MD   On: 02/23/2021 12:12         Assessment/Plan   1.  Acute respiratory  distress with hypoxic respiratory failure -Patient presents with worsening respiratory distress, cough, and requiring O2 supplementation to maintain sats above 90% -Suspect combination of acute CHF and COPD exacerbation -Chest x-ray on admission showed signs of volume overload, cardiomegaly -Blood gas ordered -Scheduled and as needed breathing treatments -Continued on Solu-Medrol 40 mg IV Q12H -Continue Lasix 40 mg IV Q12H -Echocardiogram ordered -Low salt diet -Strict I's and O's, daily weights -Antitussives scheduled and as needed -Wean O2 as tolerated  2.  Acute combined CHF exacerbation -Most recent echocardiogram that I can review is July 2021 which demonstrated EF of 40 to 45%, global hypokinesis, grade 1 diastolic dysfunction -See further plans above  3.  COPD exacerbation -See further plans above  4.  Hypokalemia -On admission potassium 3.1 -Started on potassium 40 mEq twice daily given planned scheduled diuresis -Follow-up labs ordered  5.  Tobacco abuse -Patient continues to smoke -Nicotine patch ordered  6.  CAD -Previous history of stent placement,.  Most recently done in July 2020 -Restart home medications once home meds reconciled  7.  History of prediabetes -Hemoglobin A1c ordered -Started on glucose checks, sliding scale.  Suspect will need coverage given starting on steroids  8.  Dyslipidemia -Restart home statin when home meds reconciled  9.  Essential hypertension -Restart home medications once reconciled  DVT prophylaxis: Lovenox Code Status: Full Family Communication: Patient herself Disposition Plan: Anticipate discharge home when medically optimized Consults called: None Admission status: Observation   At the point of initial evaluation, it is my clinical opinion that admission for OBSERVATION is reasonable and necessary because the patient's presenting complaints in the context of their chronic conditions represent sufficient risk of  deterioration or significant morbidity to constitute reasonable grounds for close observation in the hospital setting, but that the patient may be medically stable for discharge from the hospital within 24 to 48 hours.    Medical decision making: Patient seen at 2:56 PM on 02/23/2021.  The patient was discussed with ER provider.  What exists of the patient's chart was reviewed in depth and summarized above.  Clinical condition: Fair.        Doran Heater [facility] Please page though [place] or Epic secure chat:  For password, contact charge nurse

## 2021-02-23 NOTE — Progress Notes (Signed)
Pt said she doesn't want to wear CPAP and she does wear it at home for may be 1 or 2 hrs. States she is too tired and do not want to use cpap tonight. Told her to let the RN know if she changes her mind.

## 2021-02-24 ENCOUNTER — Observation Stay (HOSPITAL_BASED_OUTPATIENT_CLINIC_OR_DEPARTMENT_OTHER): Payer: Medicaid Other

## 2021-02-24 ENCOUNTER — Other Ambulatory Visit (HOSPITAL_COMMUNITY): Payer: Self-pay

## 2021-02-24 DIAGNOSIS — J9601 Acute respiratory failure with hypoxia: Secondary | ICD-10-CM | POA: Diagnosis not present

## 2021-02-24 DIAGNOSIS — I5021 Acute systolic (congestive) heart failure: Secondary | ICD-10-CM | POA: Diagnosis not present

## 2021-02-24 DIAGNOSIS — R0603 Acute respiratory distress: Secondary | ICD-10-CM | POA: Diagnosis not present

## 2021-02-24 DIAGNOSIS — E785 Hyperlipidemia, unspecified: Secondary | ICD-10-CM

## 2021-02-24 DIAGNOSIS — I2510 Atherosclerotic heart disease of native coronary artery without angina pectoris: Secondary | ICD-10-CM | POA: Diagnosis not present

## 2021-02-24 DIAGNOSIS — J449 Chronic obstructive pulmonary disease, unspecified: Secondary | ICD-10-CM

## 2021-02-24 DIAGNOSIS — I10 Essential (primary) hypertension: Secondary | ICD-10-CM

## 2021-02-24 DIAGNOSIS — Z9861 Coronary angioplasty status: Secondary | ICD-10-CM

## 2021-02-24 LAB — ECHOCARDIOGRAM COMPLETE
Area-P 1/2: 4.57 cm2
Calc EF: 44.3 %
Height: 61 in
MV M vel: 4.8 m/s
MV Peak grad: 92.2 mmHg
Radius: 0.6 cm
S' Lateral: 4.7 cm
Single Plane A2C EF: 47.6 %
Single Plane A4C EF: 40.4 %
Weight: 3163.2 oz

## 2021-02-24 LAB — MAGNESIUM: Magnesium: 1.9 mg/dL (ref 1.7–2.4)

## 2021-02-24 LAB — COMPREHENSIVE METABOLIC PANEL
ALT: 21 U/L (ref 0–44)
AST: 16 U/L (ref 15–41)
Albumin: 4 g/dL (ref 3.5–5.0)
Alkaline Phosphatase: 73 U/L (ref 38–126)
Anion gap: 13 (ref 5–15)
BUN: 29 mg/dL — ABNORMAL HIGH (ref 6–20)
CO2: 24 mmol/L (ref 22–32)
Calcium: 9.9 mg/dL (ref 8.9–10.3)
Chloride: 101 mmol/L (ref 98–111)
Creatinine, Ser: 1.42 mg/dL — ABNORMAL HIGH (ref 0.44–1.00)
GFR, Estimated: 43 mL/min — ABNORMAL LOW (ref >60)
Glucose, Bld: 160 mg/dL — ABNORMAL HIGH (ref 70–99)
Potassium: 3.9 mmol/L (ref 3.5–5.1)
Sodium: 138 mmol/L (ref 135–145)
Total Bilirubin: 0.7 mg/dL (ref 0.3–1.2)
Total Protein: 7.6 g/dL (ref 6.5–8.1)

## 2021-02-24 LAB — CBC
HCT: 39.2 % (ref 36.0–46.0)
Hemoglobin: 13 g/dL (ref 12.0–15.0)
MCH: 28.3 pg (ref 26.0–34.0)
MCHC: 33.2 g/dL (ref 30.0–36.0)
MCV: 85.2 fL (ref 80.0–100.0)
Platelets: 262 10*3/uL (ref 150–400)
RBC: 4.6 MIL/uL (ref 3.87–5.11)
RDW: 16.2 % — ABNORMAL HIGH (ref 11.5–15.5)
WBC: 10.8 10*3/uL — ABNORMAL HIGH (ref 4.0–10.5)
nRBC: 0 % (ref 0.0–0.2)

## 2021-02-24 LAB — GLUCOSE, CAPILLARY
Glucose-Capillary: 173 mg/dL — ABNORMAL HIGH (ref 70–99)
Glucose-Capillary: 144 mg/dL — ABNORMAL HIGH (ref 70–99)

## 2021-02-24 LAB — PHOSPHORUS: Phosphorus: 3.8 mg/dL (ref 2.5–4.6)

## 2021-02-24 MED ORDER — PREDNISONE 10 MG PO TABS
ORAL_TABLET | ORAL | 0 refills | Status: AC
  Filled 2021-02-24: qty 10, 4d supply, fill #0

## 2021-02-24 MED ORDER — NICOTINE 14 MG/24HR TD PT24
14.0000 mg | MEDICATED_PATCH | Freq: Every day | TRANSDERMAL | 0 refills | Status: DC
Start: 2021-02-25 — End: 2021-03-10
  Filled 2021-02-24: qty 28, 28d supply, fill #0

## 2021-02-24 MED ORDER — DM-GUAIFENESIN ER 30-600 MG PO TB12
1.0000 | ORAL_TABLET | Freq: Two times a day (BID) | ORAL | 0 refills | Status: AC | PRN
  Filled 2021-02-24: qty 14, 7d supply, fill #0

## 2021-02-24 MED ORDER — BENZONATATE 100 MG PO CAPS
100.0000 mg | ORAL_CAPSULE | Freq: Two times a day (BID) | ORAL | 0 refills | Status: DC
  Filled 2021-02-24: qty 20, 10d supply, fill #0

## 2021-02-24 MED FILL — Zolpidem Tartrate Tab 10 MG: ORAL | 15 days supply | Qty: 15 | Fill #0 | Status: CN

## 2021-02-24 NOTE — Discharge Summary (Signed)
Physician Discharge Summary  Tracey Manning QJF:[id number] DOB: 1961-01-26 DOA: 02/23/2021  PCP: Nolene Ebbs, MD  Admit date: 02/23/2021 Discharge date: 02/24/2021  Time spent: 45 minutes  Recommendations for Outpatient Follow-up:  Patient will be discharged to home.  Patient will need to follow up with primary care provider within one week of discharge, repeat BMP.  Follow up with cardiology. Patient should continue medications as prescribed.  Patient should follow a heart healthy diet.   Discharge Diagnoses:  Acute respiratory distress with hypoxia Acute combined CHF exacerbation COPD exacerbation Hypokalemia Tobacco abuse CAD with mildly elevated troponin History of prediabetes Dyslipidemia Essential hypertension Obesity Chronic kidney disease, stage IIIa  Discharge Condition: Stable  Diet recommendation: heart healthy  Filed Weights   02/23/21 1145 02/24/21 0414  Weight: 89.4 kg 89.7 kg    History of present illness:  On 02/23/2021 by Dr. Atha Starks Tracey Manning is a 60 y.o. female, with PMH of CAD, COPD, OSA, borderline type 2 diabetes, hypertension, dyslipidemia, morbid obesity, combined CHF, who presented to the ER on 02/23/2021 with worsening dyspnea.  Patient states that since Friday she has had progressive worsening of dyspnea.  She saw her PCP on Friday who recommended going to the hospital however patient declined at the time.  She has tried home breathing treatments with no significant improvement.  She has required O2 supplementation at home, typically only uses as needed.  She had significant coughing and inability to sleep.  She says she has not been tolerating her home CPAP machine.  She continues to smoke but says she has not smoked since Friday because she said inability to breathe.  She says she takes Lasix at home but based on history it seems like it is not scheduled.  She is followed by cardiology outpatient for CHF.  She says she has been compliant  with her home medications.  She is concerned this is a CHF exacerbation because her breathing treatments have not improved her symptoms.  She says she does not swell in her lower extremities when she has fluid on her.  Last echocardiogram I can see was in July 2021 which showed an EF of 89-37% with diastolic dysfunction.  Due to her worsening dyspnea, she presented to the hospital for further evaluation.   Hospital Course:  Acute respiratory distress with hypoxia -Resolved -Likely secondary to COPD and CHF exacerbations -ABG showed a pH of 7.432, PO2 72, bicarb 24.3, PCO2 36.5 -Patient states she has oxygen at home and uses it only when needed -Patient was able to ambulate and maintain oxygen saturations in the 90s on room air  Acute combined CHF exacerbation -Patient follows with Dr. Haroldine Laws -Echocardiogram July 2021 showed an EF of 4045%, global hypokinesis, grade 1 diastolic dysfunction -Chest x-ray showed signs of volume overload cardiomegaly -BNP 398.4 -Patient was given IV Lasix 40 mg IV twice daily -Patient states she is feeling much better I would like to go home -Continue home medications and follow-up with cardiology -Discussed diet and fluid restrictions  COPD exacerbation -Patient states that she has steroids at home -She was placed on Solu-Medrol 40 mg every 12 hours -Continue steroid taper and antitussives as needed  Hypokalemia -resolved with replacement -repeat BMP in one week  Tobacco abuse -Smoking cessation discussed -Nicotine patch  CAD with mildly elevated troponin -Denies current chest pain.  Patient has had history of stent placement -High-sensitivity troponin XX 3 however trended downward to 19 -Continue home medications  History of prediabetes -Hemoglobin A1c 6 -Patient should  continue to follow with her primary care physician  Dyslipidemia -Continue statin  Essential hypertension -Continue home medications  Obesity -BMI of  37.36 -Patient follow with PCP for lifestyle modifications  Chronic kidney disease, stage IIIa -Creatinine mildly elevated, 1.42 (baseline 1.1-1.3) -repeat BMP in one week  Procedures: None  Consultations: None  Discharge Exam: Vitals:   02/23/21 2342 02/24/21 0414  BP: 123/80 (!) 133/93  Pulse: 88 94  Resp: 19 20  Temp: 98.8 F (37.1 C) 98.2 F (36.8 C)  SpO2: 95% 100%   Patient seen and examined today.  Wants to go home and is feeling her shortness of breath is improved.  States that she has Lasix as well as steroids at home.  Currently denies further shortness of breath or chest pain, abdominal pain, nausea or vomiting, diarrhea or constipation.  Does complain of chronic pain.   General: Well developed, well nourished, NAD, appears stated age  3: NCAT, mucous membranes moist.  Cardiovascular: S1 S2 auscultated, RRR, no murmur  Respiratory: Clear to auscultation bilaterally with equal chest rise,no wheezing   Abdomen: Soft, obese, nontender, nondistended, + bowel sounds  Extremities: warm dry without cyanosis clubbing or edema  Neuro: AAOx3, nonfocal  Psych: Appropriate mood and affect  Discharge Instructions Discharge Instructions    (HEART FAILURE PATIENTS) Call MD:  Anytime you have any of the following symptoms: 1) 3 pound weight gain in 24 hours or 5 pounds in 1 week 2) shortness of breath, with or without a dry hacking cough 3) swelling in the hands, feet or stomach 4) if you have to sleep on extra pillows at night in order to breathe.   Complete by: As directed    Avoid straining   Complete by: As directed    Diet - low sodium heart healthy   Complete by: As directed    Discharge instructions   Complete by: As directed    Patient will be discharged to home.  Patient will need to follow up with primary care provider within one week of discharge, repeat BMP.  Follow up with cardiology. Patient should continue medications as prescribed.  Patient should  follow a heart healthy diet.   Heart Failure patients record your daily weight using the same scale at the same time of day   Complete by: As directed    Increase activity slowly   Complete by: As directed    STOP any activity that causes chest pain, shortness of breath, dizziness, sweating, or exessive weakness   Complete by: As directed      Allergies as of 02/24/2021      Reactions   Coreg [carvedilol] Other (See Comments)   Headache   Other Anaphylaxis, Hives   Fresh strawberries (throat swelled)   Amoxicillin Nausea And Vomiting   Did it involve swelling of the face/tongue/throat, SOB, or low BP? Yes Did it involve sudden or severe rash/hives, skin peeling, or any reaction on the inside of your mouth or nose? Yes Did you need to seek medical attention at a hospital or doctor's office? Yes When did it last happen?within last 10 years If all above answers are "NO", may proceed with cephalosporin use.   Dilaudid [hydromorphone Hcl] Nausea And Vomiting   Ibuprofen Other (See Comments)   wheezing   Latex Swelling   No reaction with bandaids   Metoprolol    HEADACHE and Wheezing      Medication List    STOP taking these medications   amoxicillin-clavulanate 875-125 MG tablet  Commonly known as: AUGMENTIN   cefixime 400 MG Caps capsule Commonly known as: SUPRAX   cephALEXin 500 MG capsule Commonly known as: KEFLEX   doxycycline 100 MG tablet Commonly known as: VIBRA-TABS     TAKE these medications   Acetaminophen Extra Strength 500 MG tablet Generic drug: acetaminophen TAKE 2 TABLETS BY MOUTH EVERY 6 HOURS AS NEEDED What changed:   how much to take  reasons to take this   Ambien 10 MG tablet Generic drug: zolpidem TAKE 1 TABLET BY MOUTH AT BEDTIME AS NEEDED FOR SLEEP What changed: Another medication with the same name was removed. Continue taking this medication, and follow the directions you see here.   aspirin 81 MG EC tablet Take 1 tablet (81 mg  total) by mouth daily.   atorvastatin 80 MG tablet Commonly known as: LIPITOR TAKE 1 TABLET BY MOUTH ONCE A DAY   benzonatate 100 MG capsule Commonly known as: TESSALON Take 1 capsule (100 mg total) by mouth 2 (two) times daily. What changed:   how much to take  how to take this  when to take this   cetirizine 10 MG tablet Commonly known as: ZYRTEC TAKE 1 TABLET BY MOUTH ONCE A DAY AS NEEDED What changed:   how much to take  reasons to take this   clopidogrel 75 MG tablet Commonly known as: PLAVIX TAKE 1 TABLET BY MOUTH ONCE A DAY What changed: Another medication with the same name was removed. Continue taking this medication, and follow the directions you see here.   cyclobenzaprine 10 MG tablet Commonly known as: FLEXERIL Take 1 tablet (10 mg total) by mouth 2 (two) times daily as needed for muscle spasms.   dextromethorphan-guaiFENesin 30-600 MG 12hr tablet Commonly known as: MUCINEX DM Take 1 tablet by mouth 2 (two) times daily as needed for up to 7 days for cough.   diclofenac Sodium 1 % Gel Commonly known as: VOLTAREN APPLY 4 GRAMS FOUR TIMES DAILY AS NEEDED FOR PAINS AS DIRECTED   Entresto 97-103 MG Generic drug: sacubitril-valsartan Take 1 tablet by mouth 2 (two) times daily.   Farxiga 10 MG Tabs tablet Generic drug: dapagliflozin propanediol TAKE 1 TABLET BY MOUTH DAILY BEFORE BREAKFAST   fluticasone-salmeterol 115-21 MCG/ACT inhaler Commonly known as: ADVAIR HFA Inhale 2 puffs into the lungs daily as needed.   furosemide 40 MG tablet Commonly known as: LASIX TAKE 1 TABLET BY MOUTH DAILY.   hydrALAZINE 50 MG tablet Commonly known as: APRESOLINE TAKE 1 TABLET BY MOUTH 3 TIMES DAILY.   isosorbide mononitrate 30 MG 24 hr tablet Commonly known as: IMDUR TAKE 1 TABLET BY MOUTH ONCE A DAY What changed: Another medication with the same name was removed. Continue taking this medication, and follow the directions you see here.   mupirocin ointment 2  % Commonly known as: BACTROBAN APPLY TO SKIN TWO TIMES DAILY AS DIRECTED What changed:   how much to take  how to take this  when to take this   Narcan 4 MG/0.1ML Liqd nasal spray kit Generic drug: naloxone Place 1 spray into the nose once. Overdose   nicotine 14 mg/24hr patch Commonly known as: NICODERM CQ - dosed in mg/24 hours Place 1 patch (14 mg total) onto the skin daily. Start taking on: February 25, 2021   nitroGLYCERIN 0.4 MG SL tablet Commonly known as: NITROSTAT DISSOLVE 1 TABLET UNDER THE TONGUE AS NEEDED AS DIRECTED What changed: Another medication with the same name was removed. Continue taking this medication,  and follow the directions you see here.   ofloxacin 0.3 % ophthalmic solution Commonly known as: OCUFLOX INSTILL 1 DROP 3 TIMES DAILY IN OPERATIVE EYE STARTING 2 DAYS PRIOR TO SURGERY AND AFTER SURGERY FOR 3 WEEKS.   potassium chloride 10 MEQ tablet Commonly known as: KLOR-CON TAKE 2 TABLETS BY MOUTH 2 TIMES DAILY   prednisoLONE acetate 1 % ophthalmic suspension Commonly known as: PRED FORTE APPLY ONE DROP IN OPERATIVE EYE(S) 3 TIMES DAILY STARTING 2 DAYS BEFORE SURGERY AND AFTER SURGERY FOR 3 WEEKS   predniSONE 10 MG tablet Commonly known as: DELTASONE Take 4 tablets (40 mg total) by mouth daily with breakfast for 1 day, THEN 3 tablets (30 mg total) daily with breakfast for 1 day, THEN 2 tablets (20 mg total) daily with breakfast for 1 day, THEN 1 tablet (10 mg total) daily with breakfast for 1 day. Start taking on: February 25, 2021 What changed:   medication strength  See the new instructions.  Another medication with the same name was removed. Continue taking this medication, and follow the directions you see here.   ProAir HFA 108 (90 Base) MCG/ACT inhaler Generic drug: albuterol INHALE 2 PUFFS EVERY 4 HOURS AS NEEDED What changed:   Another medication with the same name was changed. Make sure you understand how and when to take each.  Another  medication with the same name was removed. Continue taking this medication, and follow the directions you see here.   albuterol (2.5 MG/3ML) 0.083% nebulizer solution Commonly known as: PROVENTIL INHALE 1 VIAL VIA NEBULIZER EVERY 6 HOURS AS NEEDED What changed:   how much to take  how to take this  when to take this  reasons to take this  Another medication with the same name was removed. Continue taking this medication, and follow the directions you see here.   Repatha SureClick 774 MG/ML Soaj Generic drug: Evolocumab INJECT 1 DOSE INTO THE SKIN EVERY 14 (FOURTEEN) DAYS.   spironolactone 25 MG tablet Commonly known as: ALDACTONE TAKE 1 TABLET BY MOUTH DAILY   Trelegy Ellipta 200-62.5-25 MCG/INH Aepb Generic drug: Fluticasone-Umeclidin-Vilant Inhale 1 puff into the lungs daily.   Vitamin D (Ergocalciferol) 1.25 MG (50000 UNIT) Caps capsule Commonly known as: DRISDOL TAKE 1 CAPSULE BY MOUTH ONCE A WEEK What changed: how much to take      Allergies  Allergen Reactions  . Coreg [Carvedilol] Other (See Comments)    Headache  . Other Anaphylaxis and Hives    Fresh strawberries (throat swelled)  . Amoxicillin Nausea And Vomiting    Did it involve swelling of the face/tongue/throat, SOB, or low BP? Yes Did it involve sudden or severe rash/hives, skin peeling, or any reaction on the inside of your mouth or nose? Yes Did you need to seek medical attention at a hospital or doctor's office? Yes When did it last happen?within last 10 years If all above answers are "NO", may proceed with cephalosporin use.   . Dilaudid [Hydromorphone Hcl] Nausea And Vomiting  . Ibuprofen Other (See Comments)    wheezing  . Latex Swelling    No reaction with bandaids  . Metoprolol     HEADACHE and Wheezing    Follow-up Information    Nolene Ebbs, MD. Schedule an appointment as soon as possible for a visit in 1 week(s).   Specialty: Internal Medicine Why: Hospital follow  up Contact information: [place] [address] [phone]        [name], MD .  Specialties: Cardiology, Radiology Contact information: [address] [place] [address] [phone]        Bensimhon, Shaune Pascal, MD .   Specialty: Cardiology Contact information: [address] [place] [address] [phone]                The results of significant diagnostics from this hospitalization (including imaging, microbiology, ancillary and laboratory) are listed below for reference.    Significant Diagnostic Studies: DG Chest Portable 1 View  Result Date: 02/23/2021 CLINICAL DATA:  Shortness of breath and chest pain. Rales right lower lobe. EXAM: PORTABLE CHEST 1 VIEW COMPARISON:  Multiple priors, most recent 11/09/2020. FINDINGS: Enlarged cardiac silhouette. Kerley B lines at the right lateral lung base. Streaky bibasilar opacities. No consolidation. No visible pleural effusions or pneumothorax on this single AP portable radiograph. The visualized skeletal structures are unremarkable. Polyarticular degenerative change. IMPRESSION: 1. Kerley B lines at the right lateral lung base, suggestive of mild interstitial edema. 2. Streaky bibasilar opacities, favor atelectasis. No consolidation. 3. Cardiomegaly. Electronically Signed   By: Margaretha Sheffield MD   On: 02/23/2021 12:12   US BREAST LTD UNI RIGHT INC AXILLA  Result Date: 02/03/2021 CLINICAL DATA:  60 year old female with intermittent pain and palpable lump along the subareolar right breast. Patient also complains of intermittent white crusting along the right nipple. No definite discharge. EXAM: DIGITAL DIAGNOSTIC BILATERAL MAMMOGRAM WITH TOMOSYNTHESIS AND CAD; ULTRASOUND RIGHT BREAST LIMITED TECHNIQUE: Bilateral digital diagnostic mammography and breast tomosynthesis was performed. The images were evaluated with computer-aided detection.; Targeted ultrasound  examination of the right breast was performed COMPARISON:  Previous exam(s). ACR Breast Density Category b: There are scattered areas of fibroglandular density. FINDINGS: There are no suspicious mammographic findings in either breast. Specifically, no focal findings at the site of the right breast subareolar BB. Targeted ultrasound is performed, showing no focal or suspicious sonographic abnormalities in the subareolar right breast. IMPRESSION: 1. No mammographic evidence of malignancy in either breast. 2. No suspicious sonographic findings at the site of the patient's right breast palpable lump/pain. RECOMMENDATION: 1. Clinical and symptomatic follow-up is recommended for the patient's right breast symptoms. 2.  Screening mammogram in one year.(Code:SM-B-01Y) I have discussed the findings and recommendations with the patient. If applicable, a reminder letter will be sent to the patient regarding the next appointment. BI-RADS CATEGORY  1: Negative. Electronically Signed   By: Kristopher Oppenheim M.D.   On: 02/03/2021 12:13   MM DIAG BREAST TOMO BILATERAL  Result Date: 02/03/2021 CLINICAL DATA:  60 year old female with intermittent pain and palpable lump along the subareolar right breast. Patient also complains of intermittent white crusting along the right nipple. No definite discharge. EXAM: DIGITAL DIAGNOSTIC BILATERAL MAMMOGRAM WITH TOMOSYNTHESIS AND CAD; ULTRASOUND RIGHT BREAST LIMITED TECHNIQUE: Bilateral digital diagnostic mammography and breast tomosynthesis was performed. The images were evaluated with computer-aided detection.; Targeted ultrasound examination of the right breast was performed COMPARISON:  Previous exam(s). ACR Breast Density Category b: There are scattered areas of fibroglandular density. FINDINGS: There are no suspicious mammographic findings in either breast. Specifically, no focal findings at the site of the right breast subareolar BB. Targeted ultrasound is performed, showing no focal or  suspicious sonographic abnormalities in the subareolar right breast. IMPRESSION: 1. No mammographic evidence of malignancy in either breast. 2. No suspicious sonographic findings at the site of the patient's right breast palpable lump/pain. RECOMMENDATION: 1. Clinical and symptomatic follow-up is recommended for the patient's right breast symptoms. 2.  Screening mammogram in one year.(Code:SM-B-01Y) I have discussed the findings and recommendations with the patient. If applicable, a reminder letter will be sent to the patient regarding the next appointment. BI-RADS CATEGORY  1: Negative. Electronically Signed   By: Kristopher Oppenheim M.D.   On: 02/03/2021 12:13    Microbiology: Recent Results (from the past 240 hour(s))  Resp Panel by RT-PCR (Flu A&B, Covid) Nasopharyngeal Swab     Status: None   Collection Time: 02/23/21  1:05 PM   Specimen: Nasopharyngeal Swab; Nasopharyngeal(NP) swabs in vial transport medium  Result Value Ref Range Status   SARS Coronavirus 2 by RT PCR NEGATIVE NEGATIVE Final    Comment: (NOTE) SARS-CoV-2 target nucleic acids are NOT DETECTED.  The SARS-CoV-2 RNA is generally detectable in upper respiratory specimens during the acute phase of infection. The lowest concentration of SARS-CoV-2 viral copies this assay can detect is 138 copies/mL. A negative result does not preclude SARS-Cov-2 infection and should not be used as the sole basis for treatment or other patient management decisions. A negative result may occur with  improper specimen collection/handling, submission of specimen other than nasopharyngeal swab, presence of viral mutation(s) within the areas targeted by this assay, and inadequate number of viral copies(<138 copies/mL). A negative result must be combined with clinical observations, patient history, and epidemiological information. The expected result is Negative.  Fact Sheet for Patients:  EntrepreneurPulse.com.au  Fact Sheet for  Healthcare Providers:  IncredibleEmployment.be  This test is no t yet approved or cleared by the [place] FDA and  has been authorized for detection and/or diagnosis of SARS-CoV-2 by FDA under an Emergency Use Authorization (EUA). This EUA will remain  in effect (meaning this test can be used) for the duration of the COVID-19 declaration under Section 564(b)(1) of the Act, 21 U.S.C.section 360bbb-3(b)(1), unless the authorization is terminated  or revoked sooner.       Influenza A by PCR NEGATIVE NEGATIVE Final   Influenza B by PCR NEGATIVE NEGATIVE Final    Comment: (NOTE) The Xpert Xpress SARS-CoV-2/FLU/RSV plus assay is intended as an aid in the diagnosis of influenza from Nasopharyngeal swab specimens and should not be used as a sole basis for treatment. Nasal washings and aspirates are unacceptable for Xpert Xpress SARS-CoV-2/FLU/RSV testing.  Fact Sheet for Patients: EntrepreneurPulse.com.au  Fact Sheet for Healthcare Providers: IncredibleEmployment.be  This test is not yet approved or cleared by the [place] FDA and has been authorized for detection and/or diagnosis of SARS-CoV-2 by FDA under an Emergency Use Authorization (EUA). This EUA will remain in effect (meaning this test can be used) for the duration of the COVID-19 declaration under Section 564(b)(1) of the Act, 21 U.S.C. section 360bbb-3(b)(1), unless the authorization is terminated or revoked.  Performed at [facility], [place] [address], [place], [place] [address]      Labs: Basic Metabolic Panel: Recent Labs  Lab 02/23/21 1158 02/23/21 1623 02/24/21 0725  NA 141 140 138  K 3.1* 3.4* 3.9  CL 107  --  101  CO2 28  --  24  GLUCOSE 124*  --  160*  BUN 17  --  29*  CREATININE 1.21*  --  1.42*  CALCIUM 9.3  --  9.9  MG  --   --  1.9  PHOS  --   --  3.8   Liver Function Tests: Recent Labs  Lab 02/23/21 1158 02/24/21 0725   AST 16 16  ALT 19 21  ALKPHOS 66 73  BILITOT 0.7 0.7  PROT 6.5 7.6  ALBUMIN 3.5 4.0   No results for input(s): LIPASE, AMYLASE in the last 168 hours. No results for input(s): AMMONIA in the last 168 hours. CBC: Recent Labs  Lab 02/23/21 1158 02/23/21 1623 02/24/21 0725  WBC 7.2  --  10.8*  NEUTROABS 5.5  --   --   HGB 12.5 13.6 13.0  HCT 37.2 40.0 39.2  MCV 84.9  --  85.2  PLT 215  --  262   Cardiac Enzymes: No results for input(s): CKTOTAL, CKMB, CKMBINDEX, TROPONINI in the last 168 hours. BNP: BNP (last 3 results) Recent Labs    06/19/20 1250 11/09/20 0151 02/23/21 1158  BNP 37.9 379.4* 398.4*    ProBNP (last 3 results) No results for input(s): PROBNP in the last 8760 hours.  CBG: Recent Labs  Lab 02/23/21 2212 02/24/21 0604  GLUCAP 226* 173*       Signed:  Rim Thatch  [facility] 02/24/2021, 9:35 AM

## 2021-02-24 NOTE — Progress Notes (Signed)
SATURATION QUALIFICATIONS: (This note is used to comply with regulatory documentation for home oxygen)  Patient Saturations on Room Air at Rest = 98%  Patient Saturations on Room Air while Ambulating = 95%  Patient Saturations on 0 Liters of oxygen while Ambulating = 97%  Please briefly explain why patient needs home oxygen: Pt did not require O2 when she was ambulating there for does not need home oxygen.

## 2021-02-24 NOTE — Progress Notes (Signed)
  Echocardiogram 2D Echocardiogram has been performed.  Jennette Dubin 02/24/2021, 10:50 AM

## 2021-02-24 NOTE — Discharge Instructions (Signed)
Heart Failure, Diagnosis  Heart failure means that your heart is not able to pump blood in the right way. This makes it hard for your body to work well. Heart failure is usually a long-term (chronic) condition. You must take good care of yourself and follow your treatment plan from your doctor. What are the causes?  High blood pressure.  Buildup of cholesterol and fat in the arteries.  Heart attack. This injures the heart muscle.  Heart valves that do not open and close properly.  Damage of the heart muscle. This is also called cardiomyopathy.  Infection of the heart muscle. This is also called myocarditis.  Lung disease. What increases the risk?  Getting older. The risk of heart failure goes up as a person ages.  Being overweight.  Being female.  Use tobacco or nicotine products.  Abusing alcohol or drugs.  Having taken medicines that can damage the heart.  Having any of these conditions: ? Diabetes. ? Abnormal heart rhythms. ? Thyroid problems. ? Low blood counts (anemia).  Having a family history of heart failure. What are the signs or symptoms?  Shortness of breath.  Coughing.  Swelling of the feet, ankles, legs, or belly.  Losing or gaining weight for no reason.  Trouble breathing.  Waking from sleep because of the need to sit up and get more air.  Fast heartbeat.  Being very tired.  Feeling dizzy, or feeling like you may pass out (faint).  Having no desire to eat.  Feeling like you may vomit (nauseous).  Peeing (urinating) more at night.  Feeling confused. How is this treated? This condition may be treated with:  Medicines. These can be given to treat blood pressure and to make the heart muscles stronger.  Changes in your daily life. These may include: ? Eating a healthy diet. ? Staying at a healthy body weight. ? Quitting tobacco, alcohol, and drug use. ? Doing exercises. ? Participating in a cardiac rehabilitation program. This program  helps you improve your health through exercise, education, and counseling.  Surgery. Surgery can be done to open blocked valves, or to put devices in the heart, such as pacemakers.  A donor heart (heart transplant). You will receive a healthy heart from a donor. Follow these instructions at home:  Treat other conditions as told by your doctor. These may include high blood pressure, diabetes, thyroid disease, or abnormal heart rhythms.  Learn as much as you can about heart failure.  Get support as you need it.  Keep all follow-up visits. Summary  Heart failure means that your heart is not able to pump blood in the right way.  This condition is often caused by high blood pressure, heart attack, or damage of the heart muscle.  Symptoms of this condition include shortness of breath and swelling of the feet, ankles, legs, or belly. You may also feel very tired or feel like you may vomit.  You may be treated with medicines, surgery, or changes in your daily life.  Treat other health conditions as told by your doctor. This information is not intended to replace advice given to you by your health care provider. Make sure you discuss any questions you have with your health care provider. Document Revised: 06/01/2020 Document Reviewed: 06/01/2020 Elsevier Patient Education  2021 [place].   Acute Respiratory Failure, Adult Acute respiratory failure is a condition that is a medical emergency. It can develop quickly, and it should be treated right away. There are two types of acute  respiratory failure:  Type I respiratory failure is when the lungs are not able to get enough oxygen into the blood. This causes the blood oxygen level to drop.  Type II respiratory failure is when carbon dioxide is not passing from the lungs out of the body. This causes carbon dioxide to build up in the blood. A person may have one type of acute respiratory failure or have both types at the same time. What  are the causes? Common causes of type I respiratory failure include:  Trauma to the lung, chest, ribs, or tissues around the lung.  Pneumonia.  Lung diseases, such as pulmonary fibrosis or asthma.  Smoke, chemical, or water inhalation.  A blood clot in the lungs (pulmonary embolism).  A blood infection (sepsis).  Heart attack. Common causes of type II respiratory failure include:  Stroke.  A spinal cord injury.  A drug or alcohol overdose.  A blood infection (sepsis).  Cardiac arrest. What increases the risk? This condition is more likely to develop in people who have:  Lung diseases such as asthma or chronic obstructive pulmonary disease (COPD).  A condition that damages or weakens the muscles, nerves, bones, or tissues that are involved in breathing, such as myasthenia gravis or Guillain-Barr syndrome.  A serious infection.  A health problem that blocks the unconscious reflex that is involved in breathing, such as hypothyroidism or sleep apnea. What are the signs or symptoms? Trouble breathing is the main symptom of acute respiratory failure. Symptoms may also include:  Fast breathing.  Restlessness or anxiety.  Breathing loudly (wheezing) and grunting.  Fast or irregular heartbeats (palpitations).  Confusion or changes in behavior.  Feeling tired (fatigue), sleeping more than normal, or being hard to wake.  Skin, lips, or fingernails that appear blue (cyanosis). How is this diagnosed? This condition may be diagnosed based on:  Your medical history and a physical exam. Your health care provider will listen to your heart and lungs to check for abnormal sounds.  Tests to confirm the diagnosis and determine the cause of respiratory failure. These tests may include: ? Measuring the amount of oxygen in your blood (pulse oximetry). The measurement comes from a small device that is placed on your finger, earlobe, or toe. ? Blood tests to measure blood oxygen  and carbon dioxide and to look for signs of infection. ? Tests on a sample of the fluid that surrounds the spinal cord (cerebrospinal fluid) or a sample of fluid that is drawn from the windpipe (trachea) to check for infections. ? Chest X-ray. ? Electrocardiogram (ECG) to look at the heart's electrical activity.   How is this treated? Treatment for this condition usually takes place in a hospital intensive care unit (ICU). Treatment depends on what is causing the condition. It may include one or more of these treatments:  Oxygen may be given through your nose or a face mask.  A device such as a continuous positive airway pressure (CPAP) machine or bi-level positive airway pressure (BPAP) machine may be used to help you breathe. The device gives you oxygen and pressure.  Breathing treatments, fluids, and other medicines may be given.  A ventilator may be used to help you breathe. The machine gives you oxygen and pressure. A tube is put into your mouth and trachea to connect the ventilator. ? If this treatment is needed longer term, a tracheostomy may be placed. A tracheostomy is a breathing tube put through your neck into your trachea.  In extreme cases,  extracorporeal life support (ECLS) may be used. This treatment temporarily takes over the function of the heart and lungs, supplying oxygen and removing carbon dioxide. ECLS gives the lungs a chance to recover. Follow these instructions at home: Medicines  Take over-the-counter and prescription medicines only as told by your health care provider.  If you were prescribed an antibiotic medicine, take it as told by your health care provider. Do not stop using the antibiotic even if you start to feel better.  If you are taking blood thinners: ? Talk with your health care provider before you take any medicines that contain aspirin or NSAIDs, such as ibuprofen. These medicines increase your risk for dangerous bleeding. ? Take your medicine exactly  as told, at the same time every day. ? Avoid activities that could cause injury or bruising, and follow instructions about how to prevent falls. ? Wear a medical alert bracelet or carry a card that lists what medicines you take. General instructions  Return to your normal activities as told by your health care provider. Ask your health care provider what activities are safe for you.  Do not use any products that contain nicotine or tobacco, such as cigarettes, e-cigarettes, and chewing tobacco. If you need help quitting, ask your health care provider.  Do not drink alcohol if: ? Your health care provider tells you not to drink. ? You are pregnant, may be pregnant, or are planning to become pregnant.  Wear compression stockings as told by your health care provider. These stockings help to prevent blood clots and reduce swelling in your legs.  Attend any physical therapy and pulmonary rehabilitation as told by your health care provider.  Keep all follow-up visits as told by your health care provider. This is important. How is this prevented?  If you have an infection or a medical condition that may lead to acute respiratory failure, make sure you get proper treatment. Contact a health care provider if:  You have a fever.  Your symptoms do not improve or they get worse. Get help right away if:  You are having trouble breathing.  You lose consciousness.  You develop a fast heart rate.  Your fingers, lips, or other areas turn blue.  You are confused. These symptoms may represent a serious problem that is an emergency. Do not wait to see if the symptoms will go away. Get medical help right away. Call your local emergency services (911 in the [place] Do not drive yourself to the hospital. Summary  Acute respiratory failure is a medical emergency. It can develop quickly, and it should be treated right away.  Treatment for this condition usually takes place in a hospital intensive  care unit (ICU). Treatment may include oxygen, fluids, and medicines. A device may be used to help you breathe, such as a ventilator.  Take over-the-counter and prescription medicines only as told by your health care provider.  Contact a health care provider if your symptoms do not improve or if they get worse. This information is not intended to replace advice given to you by your health care provider. Make sure you discuss any questions you have with your health care provider. Document Revised: 10/27/2019 Document Reviewed: 10/27/2019 Elsevier Patient Education  [place].

## 2021-02-25 ENCOUNTER — Other Ambulatory Visit (HOSPITAL_COMMUNITY): Payer: Self-pay

## 2021-02-26 ENCOUNTER — Other Ambulatory Visit (HOSPITAL_COMMUNITY): Payer: Self-pay

## 2021-02-26 NOTE — Telephone Encounter (Signed)
Notes added to upcoming appt

## 2021-02-26 NOTE — Telephone Encounter (Signed)
Spoke with patient who reports she has been unable to come by office to sign off on form for repatha appeal.  She would like to have this form sent to HF clinic and she will sign on 03/10/21 during appointment.   Will interoffice mail to HF clinic, if able Message sent to their triage team for OK for this request

## 2021-02-27 ENCOUNTER — Other Ambulatory Visit (HOSPITAL_COMMUNITY): Payer: Self-pay

## 2021-02-27 MED FILL — Zolpidem Tartrate Tab 10 MG: ORAL | 30 days supply | Qty: 15 | Fill #0 | Status: AC

## 2021-02-28 ENCOUNTER — Other Ambulatory Visit (HOSPITAL_COMMUNITY): Payer: Self-pay

## 2021-03-01 ENCOUNTER — Other Ambulatory Visit (HOSPITAL_COMMUNITY): Payer: Self-pay

## 2021-03-03 ENCOUNTER — Other Ambulatory Visit (HOSPITAL_COMMUNITY): Payer: Self-pay

## 2021-03-05 ENCOUNTER — Telehealth (HOSPITAL_COMMUNITY): Payer: Self-pay | Admitting: Pharmacy Technician

## 2021-03-05 ENCOUNTER — Telehealth (HOSPITAL_COMMUNITY): Payer: Self-pay | Admitting: Adult Health

## 2021-03-05 ENCOUNTER — Telehealth (HOSPITAL_COMMUNITY): Payer: Self-pay | Admitting: Vascular Surgery

## 2021-03-05 ENCOUNTER — Other Ambulatory Visit (HOSPITAL_COMMUNITY): Payer: Self-pay

## 2021-03-05 NOTE — Telephone Encounter (Signed)
Pt  needs auth for [employer] .Marland Kitchen  And or some samples please advise

## 2021-03-05 NOTE — Telephone Encounter (Signed)
Pt request samples of Entresto, she is completely out of meds, please advise

## 2021-03-05 NOTE — Telephone Encounter (Signed)
Patient Advocate Encounter   Received notification from [facility] that prior authorization for Tracey Manning is required.   PA submitted on CoverMyMeds Key [id number] Status is pending   Will continue to follow.

## 2021-03-05 NOTE — Telephone Encounter (Signed)
Medication Samples have been provided to the patient.  Drug name: [name]       Strength: 49/51mg         Qty: 1  LOT: [id number]  Exp.Date: 5/24  Dosing instructions: take 1 tab Twice daily   The patient has been instructed regarding the correct time, dose, and frequency of taking this medication, including desired effects and most common side effects.   Karigan Cloninger 3:09 PM 03/05/2021

## 2021-03-05 NOTE — Telephone Encounter (Signed)
Addressed in other phone note.

## 2021-03-05 NOTE — Telephone Encounter (Signed)
After submitting the patient's [facility] PA, I called to discuss samples in the mean time while we wait for an approval.  The patient was under the impression that her insurance (Medicaid) would not pay for Bystolic. A PA has previously been approved, I ran a test claim and the co-pay came back at $3. She has not been taking this medication but is willing to try it as long as she does not have a migraine.   The patient reported that she stopped taking her Isosorbide as of yesterday, she was very concerned over the migraines that she has been having with changing medications. One option to help maybe alleviate this would be to try low doses of Bidil since the patient's insurance will cover it as well. She is going to wait until she comes for a visit next week and then all this will be discussed at that visit.   Charlann Boxer, CPhT

## 2021-03-06 ENCOUNTER — Other Ambulatory Visit (HOSPITAL_COMMUNITY): Payer: Self-pay

## 2021-03-07 ENCOUNTER — Telehealth (HOSPITAL_COMMUNITY): Payer: Self-pay | Admitting: Licensed Clinical Social Worker

## 2021-03-07 NOTE — Telephone Encounter (Addendum)
Paramedicine Initial Assessment:  Housing:  In what kind of housing do you live? House/apt/trailer/shelter? house  Do you rent/pay a mortgage/own? rent  Do you live with anyone? By self  Are you currently worried about losing your housing? no  Within the past 12 months have you ever stayed outside, in a car, tent, a shelter, or temporarily with someone? no  Within the past 12 months have you been unable to get utilities when it was really needed? No  Is concerned about gas bill currently.  Had two family emergencies and was unable to pay for gas bill recently.  Has turn off notice for April 18th but has applied for assistance with [employer] who has informed her they will let her know if they can help Monday.  CSW encouraged pt to call if [employer] cannot assist and we would try to help with bill to avoid turn off.  Social:  What is your current marital status? divorced  Do you have any children? Two sons- oldest in jail and youngest live in [place]  Do you have family or friends who live locally? Oldest sister lives locally  Food:  Within the past 12 months were you ever worried that food would run out before you got money to buy more? yes  Within the past 12months have you run out of food and didn't have money to buy more? Yes  Gets food stamps but don't come in till April 19th.  Has food currently and will plan to go to the store after she gets her food stamps next week.  Sometimes struggles to get to the food pantry when she is running low- CSW encouraged pt to let us know if this happens in the future and we could assist as needed.  Income:  What is your current source of income? SSI $841/month  How hard is it for you to pay for the basics like food housing, medical care, and utilities? hard  Do you have outstanding medical bills? no  Insurance:  Are you currently insured? yes  Do you have prescription coverage? yes  If no insurance, have you applied for coverage (Medicaid,  disability, marketplace etc)?  n/a  Transportation:  Do you have transportation to your medical appointments? Yes- uses Medicaid transport  In the past 12 months has lack of transportation kept you from medical appts or from getting medications? No- gets meds delivered  In the past 12 months has lack of transportation kept you from meetings, work, or getting things you needed? Yes- sometimes hard to get transport to the grocery store or food pantry   Daily Health Needs: Do you have a working scale at home? yes  How do you manage your medications at home? Taking them out of the bottle  Do you ever take your medications differently than prescribed? Not intentionally- per patient   Do you have issues affording your medications? Yes- just says the $3 copays add up quickly- has outstanding bill with [facility]   If yes, has this ever prevented you from obtaining medications? Not at this time  Do you have any concerns with mobility at home? Uses a shower chair but other than that is independent.  Do you use any assistive devices at home or have PCS at home? no  Do you have a PCP? Yes- Dr. Nolene Ebbs  Do you have any trouble reading or writing? no  Are there any additional barriers you see to getting the care you need? no  CSW  will continue to follow through paramedicine program and assist as needed.  Jorge Ny, LCSW Clinical Social Worker [facility] Desk#: [phone] Cell#: [phone]

## 2021-03-10 ENCOUNTER — Other Ambulatory Visit: Payer: Self-pay

## 2021-03-10 ENCOUNTER — Observation Stay (HOSPITAL_COMMUNITY)
Admission: EM | Admit: 2021-03-10 | Discharge: 2021-03-12 | Disposition: A | Discharge status: Home / Self Care | Payer: Medicaid Other | Attending: Cardiovascular Disease | Admitting: Cardiovascular Disease

## 2021-03-10 ENCOUNTER — Emergency Department (HOSPITAL_COMMUNITY): Payer: Medicaid Other

## 2021-03-10 ENCOUNTER — Ambulatory Visit (HOSPITAL_BASED_OUTPATIENT_CLINIC_OR_DEPARTMENT_OTHER)
Admission: RE | Admit: 2021-03-10 | Discharge: 2021-03-10 | Disposition: A | Discharge status: Home / Self Care | Payer: Medicaid Other | Source: Ambulatory Visit | Attending: Adult Health | Admitting: Adult Health

## 2021-03-10 ENCOUNTER — Encounter (HOSPITAL_COMMUNITY): Payer: Self-pay | Admitting: Emergency Medicine

## 2021-03-10 ENCOUNTER — Encounter (HOSPITAL_COMMUNITY): Payer: Self-pay

## 2021-03-10 ENCOUNTER — Other Ambulatory Visit (HOSPITAL_COMMUNITY): Payer: Self-pay

## 2021-03-10 VITALS — BP 160/120 | HR 96 | Wt 202.2 lb

## 2021-03-10 DIAGNOSIS — I25110 Atherosclerotic heart disease of native coronary artery with unstable angina pectoris: Secondary | ICD-10-CM | POA: Insufficient documentation

## 2021-03-10 DIAGNOSIS — E785 Hyperlipidemia, unspecified: Secondary | ICD-10-CM | POA: Insufficient documentation

## 2021-03-10 DIAGNOSIS — I5043 Acute on chronic combined systolic (congestive) and diastolic (congestive) heart failure: Secondary | ICD-10-CM | POA: Diagnosis not present

## 2021-03-10 DIAGNOSIS — Z20822 Contact with and (suspected) exposure to covid-19: Secondary | ICD-10-CM | POA: Diagnosis not present

## 2021-03-10 DIAGNOSIS — Z7951 Long term (current) use of inhaled steroids: Secondary | ICD-10-CM | POA: Insufficient documentation

## 2021-03-10 DIAGNOSIS — Z955 Presence of coronary angioplasty implant and graft: Secondary | ICD-10-CM | POA: Insufficient documentation

## 2021-03-10 DIAGNOSIS — Z91040 Latex allergy status: Secondary | ICD-10-CM | POA: Insufficient documentation

## 2021-03-10 DIAGNOSIS — Z79899 Other long term (current) drug therapy: Secondary | ICD-10-CM | POA: Insufficient documentation

## 2021-03-10 DIAGNOSIS — Z7902 Long term (current) use of antithrombotics/antiplatelets: Secondary | ICD-10-CM | POA: Insufficient documentation

## 2021-03-10 DIAGNOSIS — Z6838 Body mass index (BMI) 38.0-38.9, adult: Secondary | ICD-10-CM | POA: Insufficient documentation

## 2021-03-10 DIAGNOSIS — I130 Hypertensive heart and chronic kidney disease with heart failure and stage 1 through stage 4 chronic kidney disease, or unspecified chronic kidney disease: Secondary | ICD-10-CM | POA: Diagnosis not present

## 2021-03-10 DIAGNOSIS — Z720 Tobacco use: Secondary | ICD-10-CM

## 2021-03-10 DIAGNOSIS — I2510 Atherosclerotic heart disease of native coronary artery without angina pectoris: Secondary | ICD-10-CM

## 2021-03-10 DIAGNOSIS — R002 Palpitations: Secondary | ICD-10-CM

## 2021-03-10 DIAGNOSIS — J449 Chronic obstructive pulmonary disease, unspecified: Secondary | ICD-10-CM | POA: Insufficient documentation

## 2021-03-10 DIAGNOSIS — I255 Ischemic cardiomyopathy: Secondary | ICD-10-CM | POA: Insufficient documentation

## 2021-03-10 DIAGNOSIS — I110 Hypertensive heart disease with heart failure: Secondary | ICD-10-CM | POA: Insufficient documentation

## 2021-03-10 DIAGNOSIS — F17210 Nicotine dependence, cigarettes, uncomplicated: Secondary | ICD-10-CM | POA: Insufficient documentation

## 2021-03-10 DIAGNOSIS — Z7982 Long term (current) use of aspirin: Secondary | ICD-10-CM | POA: Insufficient documentation

## 2021-03-10 DIAGNOSIS — R0789 Other chest pain: Secondary | ICD-10-CM

## 2021-03-10 DIAGNOSIS — J45909 Unspecified asthma, uncomplicated: Secondary | ICD-10-CM | POA: Diagnosis not present

## 2021-03-10 DIAGNOSIS — I493 Ventricular premature depolarization: Secondary | ICD-10-CM | POA: Insufficient documentation

## 2021-03-10 DIAGNOSIS — Z8249 Family history of ischemic heart disease and other diseases of the circulatory system: Secondary | ICD-10-CM | POA: Insufficient documentation

## 2021-03-10 DIAGNOSIS — I252 Old myocardial infarction: Secondary | ICD-10-CM | POA: Insufficient documentation

## 2021-03-10 DIAGNOSIS — R072 Precordial pain: Secondary | ICD-10-CM | POA: Diagnosis present

## 2021-03-10 DIAGNOSIS — N183 Chronic kidney disease, stage 3 unspecified: Secondary | ICD-10-CM | POA: Diagnosis not present

## 2021-03-10 DIAGNOSIS — I10 Essential (primary) hypertension: Secondary | ICD-10-CM

## 2021-03-10 DIAGNOSIS — E669 Obesity, unspecified: Secondary | ICD-10-CM | POA: Insufficient documentation

## 2021-03-10 DIAGNOSIS — M7542 Impingement syndrome of left shoulder: Secondary | ICD-10-CM | POA: Diagnosis present

## 2021-03-10 DIAGNOSIS — G4733 Obstructive sleep apnea (adult) (pediatric): Secondary | ICD-10-CM | POA: Insufficient documentation

## 2021-03-10 DIAGNOSIS — Z9861 Coronary angioplasty status: Secondary | ICD-10-CM

## 2021-03-10 DIAGNOSIS — R079 Chest pain, unspecified: Secondary | ICD-10-CM

## 2021-03-10 DIAGNOSIS — I5022 Chronic systolic (congestive) heart failure: Secondary | ICD-10-CM | POA: Insufficient documentation

## 2021-03-10 LAB — CBC
HCT: 42.6 % (ref 36.0–46.0)
HCT: 43.3 % (ref 36.0–46.0)
Hemoglobin: 13.8 g/dL (ref 12.0–15.0)
Hemoglobin: 14.1 g/dL (ref 12.0–15.0)
MCH: 28.6 pg (ref 26.0–34.0)
MCH: 28.1 pg (ref 26.0–34.0)
MCHC: 32.4 g/dL (ref 30.0–36.0)
MCHC: 32.6 g/dL (ref 30.0–36.0)
MCV: 88.2 fL (ref 80.0–100.0)
MCV: 86.4 fL (ref 80.0–100.0)
Platelets: 239 10*3/uL (ref 150–400)
Platelets: 263 10*3/uL (ref 150–400)
RBC: 4.83 MIL/uL (ref 3.87–5.11)
RBC: 5.01 MIL/uL (ref 3.87–5.11)
RDW: 17 % — ABNORMAL HIGH (ref 11.5–15.5)
RDW: 16.9 % — ABNORMAL HIGH (ref 11.5–15.5)
WBC: 11.2 10*3/uL — ABNORMAL HIGH (ref 4.0–10.5)
WBC: 9.8 10*3/uL (ref 4.0–10.5)
nRBC: 0 % (ref 0.0–0.2)
nRBC: 0 % (ref 0.0–0.2)

## 2021-03-10 LAB — RESP PANEL BY RT-PCR (FLU A&B, COVID) ARPGX2
Influenza A by PCR: NEGATIVE
Influenza B by PCR: NEGATIVE
SARS Coronavirus 2 by RT PCR: NEGATIVE

## 2021-03-10 LAB — BASIC METABOLIC PANEL
Anion gap: 9 (ref 5–15)
BUN: 15 mg/dL (ref 6–20)
CO2: 32 mmol/L (ref 22–32)
Calcium: 9.6 mg/dL (ref 8.9–10.3)
Chloride: 99 mmol/L (ref 98–111)
Creatinine, Ser: 1.33 mg/dL — ABNORMAL HIGH (ref 0.44–1.00)
GFR, Estimated: 46 mL/min — ABNORMAL LOW (ref >60)
Glucose, Bld: 95 mg/dL (ref 70–99)
Potassium: 3.1 mmol/L — ABNORMAL LOW (ref 3.5–5.1)
Sodium: 140 mmol/L (ref 135–145)

## 2021-03-10 LAB — BRAIN NATRIURETIC PEPTIDE: B Natriuretic Peptide: 571 pg/mL — ABNORMAL HIGH (ref 0.0–100.0)

## 2021-03-10 LAB — TROPONIN I (HIGH SENSITIVITY)
Troponin I (High Sensitivity): 23 ng/L — ABNORMAL HIGH (ref <18)
Troponin I (High Sensitivity): 17 ng/L (ref <18)
Troponin I (High Sensitivity): 24 ng/L — ABNORMAL HIGH (ref <18)
Troponin I (High Sensitivity): 22 ng/L — ABNORMAL HIGH (ref <18)

## 2021-03-10 LAB — CREATININE, SERUM
Creatinine, Ser: 1.35 mg/dL — ABNORMAL HIGH (ref 0.44–1.00)
GFR, Estimated: 45 mL/min — ABNORMAL LOW (ref >60)

## 2021-03-10 LAB — I-STAT BETA HCG BLOOD, ED (MC, WL, AP ONLY): I-stat hCG, quantitative: <5 m[IU]/mL (ref <5)

## 2021-03-10 MED ORDER — MORPHINE SULFATE (PF) 4 MG/ML IV SOLN
4.0000 mg | Freq: Once | INTRAVENOUS | Status: AC
  Administered 2021-03-10: 4 mg via INTRAVENOUS
  Filled 2021-03-10: qty 1

## 2021-03-10 MED ORDER — MORPHINE SULFATE (PF) 2 MG/ML IV SOLN
2.0000 mg | Freq: Once | INTRAVENOUS | Status: AC
  Administered 2021-03-10: 2 mg via INTRAVENOUS
  Filled 2021-03-10: qty 1

## 2021-03-10 MED ORDER — CLOPIDOGREL BISULFATE 75 MG PO TABS
75.0000 mg | ORAL_TABLET | Freq: Every day | ORAL | Status: DC
  Administered 2021-03-11 – 2021-03-12 (×2): 75 mg via ORAL
  Filled 2021-03-10 (×2): qty 1

## 2021-03-10 MED ORDER — NITROGLYCERIN 0.4 MG SL SUBL: 0.4000 mg | SUBLINGUAL_TABLET | SUBLINGUAL | Status: DC | PRN

## 2021-03-10 MED ORDER — SPIRONOLACTONE 25 MG PO TABS
25.0000 mg | ORAL_TABLET | Freq: Every day | ORAL | Status: DC
  Administered 2021-03-11 – 2021-03-12 (×2): 25 mg via ORAL
  Filled 2021-03-10 (×2): qty 1

## 2021-03-10 MED ORDER — ASPIRIN EC 81 MG PO TBEC
81.0000 mg | DELAYED_RELEASE_TABLET | Freq: Every day | ORAL | Status: DC
  Administered 2021-03-11 – 2021-03-12 (×2): 81 mg via ORAL
  Filled 2021-03-10 (×2): qty 1

## 2021-03-10 MED ORDER — POTASSIUM CHLORIDE CRYS ER 20 MEQ PO TBCR
40.0000 meq | EXTENDED_RELEASE_TABLET | Freq: Once | ORAL | Status: AC
  Administered 2021-03-10: 40 meq via ORAL
  Filled 2021-03-10: qty 2

## 2021-03-10 MED ORDER — SACUBITRIL-VALSARTAN 49-51 MG PO TABS
1.0000 | ORAL_TABLET | Freq: Two times a day (BID) | ORAL | Status: DC
  Administered 2021-03-10 – 2021-03-12 (×4): 1 via ORAL
  Filled 2021-03-10 (×4): qty 1

## 2021-03-10 MED ORDER — ALBUTEROL SULFATE HFA 108 (90 BASE) MCG/ACT IN AERS
2.0000 | INHALATION_SPRAY | RESPIRATORY_TRACT | Status: DC | PRN
  Filled 2021-03-10: qty 6.7

## 2021-03-10 MED ORDER — FLUTICASONE FUROATE-VILANTEROL 100-25 MCG/INH IN AEPB
1.0000 | INHALATION_SPRAY | Freq: Every day | RESPIRATORY_TRACT | Status: DC
  Filled 2021-03-10: qty 28

## 2021-03-10 MED ORDER — ATORVASTATIN CALCIUM 80 MG PO TABS
80.0000 mg | ORAL_TABLET | Freq: Every day | ORAL | Status: DC
  Administered 2021-03-11 – 2021-03-12 (×2): 80 mg via ORAL
  Filled 2021-03-10 (×2): qty 1

## 2021-03-10 MED ORDER — FUROSEMIDE 10 MG/ML IJ SOLN
40.0000 mg | Freq: Once | INTRAMUSCULAR | Status: AC
  Administered 2021-03-10: 40 mg via INTRAVENOUS
  Filled 2021-03-10: qty 4

## 2021-03-10 MED ORDER — FUROSEMIDE 40 MG PO TABS
40.0000 mg | ORAL_TABLET | Freq: Every day | ORAL | Status: DC
  Administered 2021-03-11 – 2021-03-12 (×2): 40 mg via ORAL
  Filled 2021-03-10 (×2): qty 1

## 2021-03-10 MED ORDER — BISOPROLOL FUMARATE 5 MG PO TABS
5.0000 mg | ORAL_TABLET | Freq: Every day | ORAL | Status: DC
  Administered 2021-03-11 – 2021-03-12 (×2): 5 mg via ORAL
  Filled 2021-03-10 (×2): qty 1

## 2021-03-10 MED ORDER — HEPARIN SODIUM (PORCINE) 5000 UNIT/ML IJ SOLN
5000.0000 [IU] | Freq: Three times a day (TID) | INTRAMUSCULAR | Status: DC
  Filled 2021-03-10 (×2): qty 1

## 2021-03-10 MED ORDER — SPIRONOLACTONE 25 MG PO TABS
ORAL_TABLET | Freq: Every day | ORAL | 3 refills | Status: DC
  Filled 2021-03-10: qty 30, 30d supply, fill #0

## 2021-03-10 MED ORDER — DAPAGLIFLOZIN PROPANEDIOL 10 MG PO TABS
10.0000 mg | ORAL_TABLET | Freq: Every day | ORAL | Status: DC
  Administered 2021-03-11 – 2021-03-12 (×2): 10 mg via ORAL
  Filled 2021-03-10 (×3): qty 1

## 2021-03-10 MED ORDER — ONDANSETRON HCL 4 MG/2ML IJ SOLN: 4.0000 mg | Freq: Four times a day (QID) | INTRAMUSCULAR | Status: DC | PRN

## 2021-03-10 MED ORDER — ACETAMINOPHEN 325 MG PO TABS
650.0000 mg | ORAL_TABLET | ORAL | Status: DC | PRN
  Administered 2021-03-11 – 2021-03-12 (×2): 650 mg via ORAL
  Filled 2021-03-10 (×2): qty 2

## 2021-03-10 MED ORDER — ALBUTEROL SULFATE (2.5 MG/3ML) 0.083% IN NEBU: 2.5000 mg | INHALATION_SOLUTION | Freq: Four times a day (QID) | RESPIRATORY_TRACT | Status: DC | PRN

## 2021-03-10 MED ORDER — POTASSIUM CHLORIDE ER 10 MEQ PO TBCR
EXTENDED_RELEASE_TABLET | Freq: Two times a day (BID) | ORAL | 3 refills | Status: DC
  Filled 2021-03-10: qty 120, 30d supply, fill #0

## 2021-03-10 MED ORDER — ASPIRIN EC 81 MG PO TBEC: 81.0000 mg | DELAYED_RELEASE_TABLET | Freq: Every day | ORAL | Status: DC

## 2021-03-10 MED ORDER — ENTRESTO 97-103 MG PO TABS
1.0000 | ORAL_TABLET | Freq: Two times a day (BID) | ORAL | 3 refills | Status: DC
  Filled 2021-03-10: qty 180, 90d supply, fill #0

## 2021-03-10 MED ORDER — ZOLPIDEM TARTRATE 5 MG PO TABS
5.0000 mg | ORAL_TABLET | Freq: Once | ORAL | Status: AC
  Administered 2021-03-10: 5 mg via ORAL
  Filled 2021-03-10: qty 1

## 2021-03-10 NOTE — Addendum Note (Signed)
Encounter addended by: Jorge Ny, LCSW on: 03/10/2021 12:06 PM  Actions taken: Pend clinical note

## 2021-03-10 NOTE — Progress Notes (Signed)
ADVANCED HF CLINIC PROGRESS NOTE  Primary Care: Dr. Tacy Dura  Primary Cardiologist: Dr. Gwenlyn Found HF Cardiologist: Dr. Haroldine Laws   HPI: Tracey Manning is 60 y/o woman with morbid obesity, CAD s/p previous PCI, COPD with ongoing tobacco use, OSA (intolerant of CPAP), borderline DM2, HTN, HL and systolic HF due to iCM, recently referrred by Dr. Gwenlyn Found to [facility] for further HF evaluation.   She had a remote PCI in 2004 in [place]. In 2008 she had a circumflex PCI by Dr. Claiborne Billings. She had known occluded RCA with left-to-right collaterals. In 2012 she had an LAD stent placedby Dr. Claiborne Billings.  She was admitted 06/11/2019 with acute respiratory failure and unstable angina. She had new LV dysfunction with an EF of 25%. She had acute kidney injury as well with a creatinine of 1.8. the patient was admitted, diuresed, and underwent diagnostic catheterization 06/15/2019. This revealed a 60 to 70% mid circumflex stenosis, known occluded RCA with left-to-right collaterals, and a 95% proximal LAD before the previously placed stent which was 50 to 60% narrowed. After review the patient was turned down for CABG and she underwent intervention to her LAD on 06/19/2019 by Dr. Gwenlyn Found.  She had a f/u echo 10/20 which revealed her EF to be unchanged at 25-30%.  She returned for HF follow up 11/21. Last visit hydralazine was added. Complaining of palpitations. SOB with exertion. Denies PND/Orthopnea. No chest pain. Walking 2 blocks per day. Smoking 1/2 PPD. Using rescue inhaler daily. Hydralazine was increased to 37.5 tid, digoxin stopped, and Zio patch ordered.  Admitted 02/23/21 with increased shortness of breath. Diuresed with IV lasix and discharged the next day.   Today she returns for HF follow up.Overall feeling fair. Having a hard morning. SOB with exeriton. Takes her time.  Denies PND/Orthopnea. Appetite ok. No fever or chills. Weight at home 200 pounds. Denied chest pain during the visit. Not using CPAP.  Taking meds but not taking potassium or entresto. Has a hard time paying for medications so she had to stretch out her medicaitons . Intolerant imdur due to migraine. She received samples 49-51 on Friday. Says she took one dose then had palpitations so she did not take anymore. Trying to quit smoking. Lives alone.   Zio 12/21:  1. Sinus rhythm - avg HR of 90 bpm. 2. One 6-beat run NSVT 3. Five runs of SVT - the longest lasting 24.6 secs with an avg rate of 119 bpm. Isolated SVEs were rare (<1.0%) 4. Occasional PVCs (4.0%, [id number]) - however on one day there was 10.5% burden and another ay 8.7% burden.   ECHO 02/2021: EF 30-35%  Echo 06/19/20: EF 40-45% RV ok   Past Medical History:  Diagnosis Date  . Anxiety   . Arthritis   . Asthma   . Barrett's esophagus   . Chest pain   . Collagen vascular disease ([place])   . Colon polyps   . Coronary artery disease   . Depression   . Dyslipidemia   . GERD (gastroesophageal reflux disease)    occ  . Hyperlipidemia   . Hypertension   . Myocardial infarct ([place]) [id number]  . Sciatica   . Sleep apnea    no CPAP ordered but using oxygen at bedtime  . Tobacco abuse     Current Outpatient Medications  Medication Sig Dispense Refill  . acetaminophen (TYLENOL) 500 MG tablet TAKE 2 TABLETS BY MOUTH EVERY 6 HOURS AS NEEDED (Patient taking differently: Take 500-1,000 mg by mouth every 6 (six)  hours as needed for moderate pain.) 100 tablet 0  . albuterol (PROVENTIL) (2.5 MG/3ML) 0.083% nebulizer solution INHALE 1 VIAL VIA NEBULIZER EVERY 6 HOURS AS NEEDED (Patient taking differently: Take 2.5 mg by nebulization every 6 (six) hours as needed for wheezing or shortness of breath.) 90 mL 11  . albuterol (VENTOLIN HFA) 108 (90 Base) MCG/ACT inhaler INHALE 2 PUFFS EVERY 4 HOURS AS NEEDED 8.5 g 5  . aspirin EC 81 MG EC tablet Take 1 tablet (81 mg total) by mouth daily. 60 tablet 1  . atorvastatin (LIPITOR) 80 MG tablet TAKE 1 TABLET BY MOUTH ONCE A DAY 90 tablet 2   . benzonatate (TESSALON) 100 MG capsule Take 1 capsule (100 mg total) by mouth 2 (two) times daily. 20 capsule 0  . cetirizine (ZYRTEC) 10 MG tablet TAKE 1 TABLET BY MOUTH ONCE A DAY AS NEEDED (Patient taking differently: Take 10 mg by mouth daily as needed for allergies.) 90 tablet 2  . clopidogrel (PLAVIX) 75 MG tablet TAKE 1 TABLET BY MOUTH ONCE A DAY 90 tablet 2  . cyclobenzaprine (FLEXERIL) 10 MG tablet Take 1 tablet (10 mg total) by mouth 2 (two) times daily as needed for muscle spasms. 20 tablet 0  . dapagliflozin propanediol (FARXIGA) 10 MG TABS tablet TAKE 1 TABLET BY MOUTH DAILY BEFORE BREAKFAST 30 tablet 11  . diclofenac Sodium (VOLTAREN) 1 % GEL APPLY 4 GRAMS FOUR TIMES DAILY AS NEEDED FOR PAINS AS DIRECTED 500 g 5  . fluticasone-salmeterol (ADVAIR HFA) 115-21 MCG/ACT inhaler Inhale 2 puffs into the lungs daily as needed.    . Fluticasone-Umeclidin-Vilant (TRELEGY ELLIPTA) 200-62.5-25 MCG/INH AEPB Inhale 1 puff into the lungs daily.    . furosemide (LASIX) 40 MG tablet TAKE 1 TABLET BY MOUTH DAILY. 60 tablet 0  . hydrALAZINE (APRESOLINE) 50 MG tablet TAKE 1 TABLET BY MOUTH 3 TIMES DAILY. 90 tablet 3  . NARCAN 4 MG/0.1ML LIQD nasal spray kit Place 1 spray into the nose once. Overdose    . nitroGLYCERIN (NITROSTAT) 0.4 MG SL tablet DISSOLVE 1 TABLET UNDER THE TONGUE AS NEEDED AS DIRECTED 30 tablet 5  . ofloxacin (OCUFLOX) 0.3 % ophthalmic solution INSTILL 1 DROP 3 TIMES DAILY IN OPERATIVE EYE STARTING 2 DAYS PRIOR TO SURGERY AND AFTER SURGERY FOR 3 WEEKS. 5 mL 1  . prednisoLONE acetate (PRED FORTE) 1 % ophthalmic suspension APPLY ONE DROP IN OPERATIVE EYE(S) 3 TIMES DAILY STARTING 2 DAYS BEFORE SURGERY AND AFTER SURGERY FOR 3 WEEKS 5 mL 1  . spironolactone (ALDACTONE) 25 MG tablet TAKE 1 TABLET BY MOUTH DAILY 30 tablet 3  . Vitamin D, Ergocalciferol, (DRISDOL) 1.25 MG (50000 UNIT) CAPS capsule TAKE 1 CAPSULE BY MOUTH ONCE A WEEK (Patient taking differently: Take 50,000 Units by mouth once  a week.) 4 capsule 5  . zolpidem (AMBIEN) 10 MG tablet TAKE 1 TABLET BY MOUTH AT BEDTIME AS NEEDED FOR SLEEP 30 tablet 0  . Evolocumab 140 MG/ML SOAJ INJECT 1 DOSE INTO THE SKIN EVERY 14 (FOURTEEN) DAYS. (Patient not taking: Reported on 03/10/2021) 2 mL 11  . potassium chloride (KLOR-CON) 10 MEQ tablet TAKE 2 TABLETS BY MOUTH 2 TIMES DAILY (Patient not taking: Reported on 03/10/2021) 120 tablet 3  . sacubitril-valsartan (ENTRESTO) 97-103 MG Take 1 tablet by mouth 2 (two) times daily. (Patient not taking: Reported on 03/10/2021) 60 tablet 5   No current facility-administered medications for this encounter.    Allergies  Allergen Reactions  . Coreg [Carvedilol] Other (See Comments)  Headache  . Other Anaphylaxis and Hives    Fresh strawberries (throat swelled)  . Amoxicillin Nausea And Vomiting    Did it involve swelling of the face/tongue/throat, SOB, or low BP? Yes Did it involve sudden or severe rash/hives, skin peeling, or any reaction on the inside of your mouth or nose? Yes Did you need to seek medical attention at a hospital or doctor's office? Yes When did it last happen?within last 10 years If all above answers are "NO", may proceed with cephalosporin use.   . Dilaudid [Hydromorphone Hcl] Nausea And Vomiting  . Ibuprofen Other (See Comments)    wheezing  . Latex Swelling    No reaction with bandaids  . Metoprolol     HEADACHE and Wheezing     Social History   Socioeconomic History  . Marital status: Single    Spouse name: Not on file  . Number of children: 2  . Years of education: 54  . Highest education level: High school graduate  Occupational History  . Not on file  Tobacco Use  . Smoking status: Current Every Day Smoker    Packs/day: 0.50    Years: 34.00    Pack years: 17.00    Types: Cigarettes  . Smokeless tobacco: Never Used  Vaping Use  . Vaping Use: Never used  Substance and Sexual Activity  . Alcohol use: No    Alcohol/week: 0.0 standard  drinks    Comment: seldom  . Drug use: Yes    Types: Marijuana    Comment: 06/19/19  . Sexual activity: Yes    Partners: Male    Birth control/protection: Post-menopausal  Other Topics Concern  . Not on file  Social History Narrative  . Not on file   Social Determinants of Health   Financial Resource Strain: Medium Risk  . Difficulty of Paying Living Expenses: Somewhat hard  Food Insecurity: Food Insecurity Present  . Worried About Charity fundraiser in the Last Year: Sometimes true  . Ran Out of Food in the Last Year: Sometimes true  Transportation Needs: Unmet Transportation Needs  . Lack of Transportation (Medical): No  . Lack of Transportation (Non-Medical): Yes  Physical Activity: Not on file  Stress: Not on file  Social Connections: Not on file  Intimate Partner Violence: Not on file     Family History  Problem Relation Age of Onset  . Leukemia Mother   . Clotting disorder Father        blood clot  . Hypertension Sister   . Diabetes Sister   . Stroke Sister 72  . Bleeding Disorder Son        ITP "free bleeding disorder"  . Colon cancer Paternal Grandmother   . Diabetes Paternal Grandmother   . Stomach cancer Neg Hx   . Pancreatic cancer Neg Hx     Vitals:   03/10/21 0950  BP: (!) 160/120  Pulse: 96  SpO2: 97%  Weight: 91.7 kg (202 lb 3.2 oz)   Wt Readings from Last 3 Encounters:  03/10/21 91.7 kg (202 lb 3.2 oz)  02/24/21 89.7 kg (197 lb 11.2 oz)  01/29/21 91.7 kg (202 lb 3.2 oz)    PHYSICAL EXAM: General:  Tearful. No resp difficulty. Walked in the clinic.  HEENT: normal Neck: supple. no JVD. Carotids 2+ bilat; no bruits. No lymphadenopathy or thryomegaly appreciated. Cor: PMI nondisplaced. Regular rate & rhythm. No rubs, gallops or murmurs. Lungs: clear Abdomen: soft, nontender, nondistended. No hepatosplenomegaly. No bruits or masses.  Good bowel sounds. Extremities: no cyanosis, clubbing, rash, edema Neuro: alert & orientedx3, cranial nerves  grossly intact. moves all 4 extremities w/o difficulty. Affect pleasant  SR with occasional PVCs.   ASSESSMENT & PLAN:  1. Chronic systolic CHF due to iCM - Echo 10/20 EF 25-30% - Echo 05/2020 EF 40-45%  -Echo 02/2021 EF 30-35%  - NYHA [name]. Volume status stable. - Not Bystolic due to cost. Covered by medicaid and has 3.00 copay.   - Need to restart entresto 97-103 once she receives in the mail. Refused to try samples of entresto 49-51 mg.  - Restart spironolactone 25 mg daily. - Continue hydralazine. Intolerant imdur due to migraine. Not sure she would tolerate bidil.  - Continue Farxiga 10 mg daily.  - PA submitted for bisoprolol. ? 3.00  copay but she cant afford. She can not afford. Intolerant metoprolol & carvedilol due to headache.    2. CAD S/P multiple PCIs - s/p PCI in [place] - s/p OM PCI '08-Dr Claiborne Billings - s/p LAD PCI/DES 2012 - s/p pLAD PCI with DES 06/19/2019- Dr Gwenlyn Found after patient turned down for CABG. Known occluded RCA with L-R collaterals, 65% mCFX-EF 25% - No chest pain during visiy  - Continue ASA/statin.  - Managed by Dr. Gwenlyn Found.   3. HTN - Elevated but has been off entresto. She should be receiving in the mail today.   4. COPD with ongoing tobacco use - Trying to quit smoking.   5. OSA: - Intolerant CPAP.   6. PVCs - See Zio report. - Start bisoprolol 2.5 mg daily after PA. - Not ideal amio candidate with lung disease.  7. Obesity Body mass index is 38.21 kg/m.   Referred to HF Paramedicine for medication management.   While waiting AVS she developed left sided chest and left arm pain. Pain 10/10. Similar to last previous chest pain. She stopped taking imdur due to migraine   Rapid Response called for chest pain. Sent to ED.     Larson Limones  NP-C  03/10/21 10:02 AM

## 2021-03-10 NOTE — Telephone Encounter (Signed)
Faxed form to be signed

## 2021-03-10 NOTE — Plan of Care (Signed)
  Problem: Clinical Measurements: Goal: Respiratory complications will improve Outcome: Progressing   Problem: Clinical Measurements: Goal: Cardiovascular complication will be avoided Outcome: Progressing   Problem: Nutrition: Goal: Adequate nutrition will be maintained Outcome: Progressing   Problem: Coping: Goal: Level of anxiety will decrease Outcome: Progressing   Problem: Elimination: Goal: Will not experience complications related to bowel motility Outcome: Progressing Goal: Will not experience complications related to urinary retention Outcome: Progressing

## 2021-03-10 NOTE — ED Provider Notes (Signed)
[facility] Provider Note   CSN: [id number] Arrival date & time: 03/10/21  1104     History Chief Complaint  Patient presents with  . Chest Pain    Tracey Manning is a 60 y.o. female.  The history is provided by the patient. No language interpreter was used.  Chest Pain Pain location:  Substernal area and L chest Pain quality: aching   Pain radiates to:  L shoulder and L arm Pain severity:  Severe Onset quality:  Sudden Timing:  Constant Progression:  Worsening Chronicity:  New Relieved by:  Nothing Worsened by:  Nothing Ineffective treatments:  None tried Associated symptoms: no abdominal pain   Risk factors: hypertension    Pt has a history of heart failure.  Pt was in heart failure clinic and began having.  Pt reports she is not taking her heart failure medications because it made her feel bad. Pt reports morphine works best for her chest pain      Past Medical History:  Diagnosis Date  . Anxiety   . Arthritis   . Asthma   . Barrett's esophagus   . Chest pain   . Collagen vascular disease ([place])   . Colon polyps   . Coronary artery disease   . Depression   . Dyslipidemia   . GERD (gastroesophageal reflux disease)    occ  . Hyperlipidemia   . Hypertension   . Myocardial infarct ([place]) [phone]  . Sciatica   . Sleep apnea    no CPAP ordered but using oxygen at bedtime  . Tobacco abuse     Patient Active Problem List   Diagnosis Date Noted  . Acute respiratory failure with hypoxia ([place]) 02/23/2021  . Impingement syndrome of left shoulder 03/12/2020  . Chronic pain 07/18/2019  . CRI (chronic renal insufficiency), stage 3 (moderate) 07/18/2019  . COPD (chronic obstructive pulmonary disease) ([place]) 06/28/2019  . Obesity (BMI 30-39.9) 06/28/2019  . Ischemic cardiomyopathy 06/28/2019  . Dyspnea on exertion 06/15/2019  . Acute systolic CHF (congestive heart failure) ([place])   . AKI (acute kidney injury) ([place])   .  Abnormal uterine bleeding (AUB) 04/03/2016  . Dysfunctional uterine bleeding 11/14/2015  . Bacterial vaginosis (recurrent) 11/14/2015  . Chest pain 11/14/2015  . Essential hypertension 04/04/2014  . Dyslipidemia, goal LDL below 70 04/04/2014  . CAD S/P multiple PCIs 04/04/2014    Past Surgical History:  Procedure Laterality Date  . CARDIAC CATHETERIZATION  10/09/2003   [place]) - LAD with 30% prox narrowing, 50% stenosis in mid-portion of PLA; RCA with 40% narrowing proximally ([name], [name])  . CARDIAC CATHETERIZATION  10/10/2007   70% stenosis in first septal perforator branch of LAD, 60-70% narrowing in mid LAD, 20% narrowing in mid AV groove Cfx with 80% diffuse narrowing in small distal marginal, total occlusion of mid RCA with L to R collaterals, 90% stenosis diffusely in prox branch of RCA followed by 70% stenosis in secondary curve & 80% stenosis in small marginal branch ([name])  . CARDIAC CATHETERIZATION  05/28/2008   normal L main, RCA with 100% prox lesion w/distal filling from LAD collaterals, LAD with 20% prox tubular lesion/ 40% mid LAD lesion/previous stent patent ([name])  . CARDIAC CATHETERIZATION  09/15/2009   discrete 100% osital RCA lesion, 50% prox LAD lesion, non-obstructive disease in all coronaries ([name])  . CESAREAN SECTION    . COLONOSCOPY    . COLONOSCOPY W/  POLYPECTOMY    . CORONARY ANGIOPLASTY WITH STENT PLACEMENT  10/31/2007   PCI of distal Cfx with 2.25x25m Taxus Adam DES, 60% narrowing of mid LAD (Dr. TCorky Downs  . CORONARY ANGIOPLASTY WITH STENT PLACEMENT  06/11/2011   PCI of prox-mid LAD with 3x160mDES Resolute (Dr. T.[name] . CORONARY STENT INTERVENTION N/A 06/19/2019   Procedure: CORONARY STENT INTERVENTION;  Surgeon: BeLorretta HarpMD;  [facility];  Service: Cardiovascular;  Laterality: N/A;  LAD  . DILATION AND CURETTAGE, DIAGNOSTIC / THERAPEUTIC    . DILITATION &  CURRETTAGE/HYSTROSCOPY WITH HYDROTHERMAL ABLATION N/A 04/03/2016   Procedure: DILATATION & CURETTAGE/HYSTEROSCOPY WITH HYDROTHERMAL ABLATION;  Surgeon: ChShelly BombardMD;  [facility];  Service: Gynecology;  Laterality: N/A;  . NM MYOCAR PERF WALL MOTION  08/2009   persantine myoview - normal perfusion in all regions, perfusion defect in anterior region (breast attenuation), EF 52%, low risk scan  . RIGHT/LEFT HEART CATH AND CORONARY ANGIOGRAPHY N/A 06/15/2019   Procedure: RIGHT/LEFT HEART CATH AND CORONARY ANGIOGRAPHY;  Surgeon: SmBelva CromeMD;  [facility];  Service: Cardiovascular;  Laterality: N/A;     OB History    Gravida  8   Para      Term      Preterm      AB  6   Living  2     SAB  6   IAB      Ectopic      Multiple      Live Births  2           Family History  Problem Relation Age of Onset  . Leukemia Mother   . Clotting disorder Father        blood clot  . Hypertension Sister   . Diabetes Sister   . Stroke Sister 4071. Bleeding Disorder Son        ITP "free bleeding disorder"  . Colon cancer Paternal Grandmother   . Diabetes Paternal Grandmother   . Stomach cancer Neg Hx   . Pancreatic cancer Neg Hx     Social History   Tobacco Use  . Smoking status: Current Every Day Smoker    Packs/day: 0.50    Years: 34.00    Pack years: 17.00    Types: Cigarettes  . Smokeless tobacco: Never Used  Vaping Use  . Vaping Use: Never used  Substance Use Topics  . Alcohol use: No    Alcohol/week: 0.0 standard drinks    Comment: seldom  . Drug use: Yes    Types: Marijuana    Comment: 06/19/19    Home Medications Prior to Admission medications   Medication Sig Start Date End Date Taking? Authorizing Provider  acetaminophen (TYLENOL) 500 MG tablet TAKE 2 TABLETS BY MOUTH EVERY 6 HOURS AS NEEDED Patient taking differently: Take 500-1,000 mg by mouth every 6 (six) hours as needed for moderate pain. 10/09/20 10/09/21  AvNolene EbbsMD  albuterol (PROVENTIL) (2.5 MG/3ML) 0.083% nebulizer solution INHALE 1 VIAL VIA NEBULIZER EVERY 6 HOURS AS NEEDED Patient taking differently: Take 2.5 mg by nebulization every 6 (six) hours as needed for wheezing or shortness of breath. 02/21/21 02/21/22  AvNolene EbbsMD  albuterol (VENTOLIN HFA) 108 (90 Base) MCG/ACT inhaler INHALE 2 PUFFS EVERY 4 HOURS AS NEEDED 02/21/21 02/21/22  AvNolene EbbsMD  aspirin EC 81 MG EC tablet Take 1 tablet (81 mg total) by mouth daily. 06/21/19  [address], Carole N, DO  atorvastatin (LIPITOR) 80 MG tablet TAKE 1 TABLET BY MOUTH ONCE A DAY 12/18/20 12/18/21  Nolene Ebbs, MD  benzonatate (TESSALON) 100 MG capsule Take 1 capsule (100 mg total) by mouth 2 (two) times daily. 02/24/21   Mikhail, Velta Addison, DO  cetirizine (ZYRTEC) 10 MG tablet TAKE 1 TABLET BY MOUTH ONCE A DAY AS NEEDED Patient taking differently: Take 10 mg by mouth daily as needed for allergies. 02/21/21 02/21/22  Nolene Ebbs, MD  clopidogrel (PLAVIX) 75 MG tablet TAKE 1 TABLET BY MOUTH ONCE A DAY 12/18/20 12/18/21  Nolene Ebbs, MD  cyclobenzaprine (FLEXERIL) 10 MG tablet Take 1 tablet (10 mg total) by mouth 2 (two) times daily as needed for muscle spasms. 08/22/20   Domenic Moras, PA-C  dapagliflozin propanediol (FARXIGA) 10 MG TABS tablet TAKE 1 TABLET BY MOUTH DAILY BEFORE BREAKFAST 10/21/20 10/21/21  Bensimhon, Shaune Pascal, MD  diclofenac Sodium (VOLTAREN) 1 % GEL APPLY 4 GRAMS FOUR TIMES DAILY AS NEEDED FOR PAINS AS DIRECTED 09/11/20 09/11/21  Nolene Ebbs, MD  Evolocumab 140 MG/ML SOAJ INJECT 1 DOSE INTO THE SKIN EVERY 14 (FOURTEEN) DAYS. Patient not taking: Reported on 03/10/2021 01/29/21 01/29/22  Pixie Casino, MD  fluticasone-salmeterol (ADVAIR HFA) [phone] MCG/ACT inhaler Inhale 2 puffs into the lungs daily as needed.    [provider]  Fluticasone-Umeclidin-Vilant (TRELEGY ELLIPTA) 200-62.5-25 MCG/INH AEPB Inhale 1 puff into the lungs daily.    [provider]  furosemide (LASIX)  40 MG tablet TAKE 1 TABLET BY MOUTH DAILY. 10/21/20 10/21/21  Bensimhon, Shaune Pascal, MD  hydrALAZINE (APRESOLINE) 50 MG tablet TAKE 1 TABLET BY MOUTH 3 TIMES DAILY. 12/16/20 12/16/21  MilfordMaricela Bo, FNP  NARCAN 4 MG/0.1ML LIQD nasal spray kit Place 1 spray into the nose once. Overdose 09/01/18   [provider]  nitroGLYCERIN (NITROSTAT) 0.4 MG SL tablet DISSOLVE 1 TABLET UNDER THE TONGUE AS NEEDED AS DIRECTED 12/18/20 12/18/21  Nolene Ebbs, MD  ofloxacin (OCUFLOX) 0.3 % ophthalmic solution INSTILL 1 DROP 3 TIMES DAILY IN OPERATIVE EYE STARTING 2 DAYS PRIOR TO SURGERY AND AFTER SURGERY FOR 3 WEEKS. 11/29/20 11/29/21  Rutherford Guys, MD  potassium chloride (KLOR-CON) 10 MEQ tablet TAKE 2 TABLETS BY MOUTH 2 TIMES DAILY 03/10/21 03/10/22  Clegg, [name] D, NP  prednisoLONE acetate (PRED FORTE) 1 % ophthalmic suspension APPLY ONE DROP IN OPERATIVE EYE(S) 3 TIMES DAILY STARTING 2 DAYS BEFORE SURGERY AND AFTER SURGERY FOR 3 WEEKS 11/29/20 11/29/21  Rutherford Guys, MD  sacubitril-valsartan (ENTRESTO) 97-103 MG Take 1 tablet by mouth 2 (two) times daily. 03/10/21   Clegg, [name] D, NP  spironolactone (ALDACTONE) 25 MG tablet TAKE 1 TABLET BY MOUTH DAILY 03/10/21 03/10/22  Clegg, [name] D, NP  Vitamin D, Ergocalciferol, (DRISDOL) 1.25 MG (50000 UNIT) CAPS capsule TAKE 1 CAPSULE BY MOUTH ONCE A WEEK Patient taking differently: Take 50,000 Units by mouth once a week. 02/21/21 02/21/22  Nolene Ebbs, MD  zolpidem (AMBIEN) 10 MG tablet TAKE 1 TABLET BY MOUTH AT BEDTIME AS NEEDED FOR SLEEP 02/21/21 08/20/21  Nolene Ebbs, MD    Allergies    Coreg [carvedilol], Other, Imdur [isosorbide nitrate], Amoxicillin, Dilaudid [hydromorphone hcl], Ibuprofen, Latex, and Metoprolol  Review of Systems   Review of Systems  Cardiovascular: Positive for chest pain.  Gastrointestinal: Negative for abdominal pain.  All other systems reviewed and are negative.   Physical Exam Updated Vital Signs BP (!) 161/102   Pulse 96   Temp 98.3 F  (36.8 C) (Oral)  Resp 15   LMP  (LMP Unknown)   SpO2 100%   Physical Exam Vitals reviewed.  Cardiovascular:     Rate and Rhythm: Normal rate.     Heart sounds: Normal heart sounds.  Pulmonary:     Effort: Pulmonary effort is normal.     Breath sounds: Normal breath sounds.  Musculoskeletal:        General: Normal range of motion.     Cervical back: Normal range of motion.  Skin:    General: Skin is warm.  Neurological:     General: No focal deficit present.     Mental Status: She is alert.  Psychiatric:        Mood and Affect: Mood normal.     ED Results / Procedures / Treatments   Labs (all labs ordered are listed, but only abnormal results are displayed) Labs Reviewed  BASIC METABOLIC PANEL - Abnormal; Notable for the following components:      Result Value   Potassium 3.1 (*)    Creatinine, Ser 1.33 (*)    GFR, Estimated 46 (*)    All other components within normal limits  CBC - Abnormal; Notable for the following components:   WBC 11.2 (*)    RDW 17.0 (*)    All other components within normal limits  BRAIN NATRIURETIC PEPTIDE - Abnormal; Notable for the following components:   B Natriuretic Peptide 571.0 (*)    All other components within normal limits  TROPONIN I (HIGH SENSITIVITY) - Abnormal; Notable for the following components:   Troponin I (High Sensitivity) 23 (*)    All other components within normal limits  I-STAT BETA HCG BLOOD, ED ([facility], [facility], [facility] ONLY)  TROPONIN I (HIGH SENSITIVITY)    EKG None  Radiology DG Chest 2 View  Result Date: 03/10/2021 CLINICAL DATA:  Chest pain radiating to the left. Shortness of breath. EXAM: CHEST - 2 VIEW COMPARISON:  One-view chest x-ray 02/23/2021 FINDINGS: The heart is enlarged. Increasing interstitial pattern is consistent with edema. No effusions are present. No significant airspace consolidation is present. Minimal bibasilar atelectasis noted. Mild degenerative changes are noted in the midthoracic spine.  IMPRESSION: Cardiomegaly with increasing interstitial edema compatible with congestive heart failure. Electronically Signed   By: San Morelle M.D.   On: 03/10/2021 12:37    Procedures Procedures   Medications Ordered in ED Medications  furosemide (LASIX) injection 40 mg (has no administration in time range)  morphine 4 MG/ML injection 4 mg (has no administration in time range)  morphine 4 MG/ML injection 4 mg (4 mg Intravenous Given 03/10/21 1206)    ED Course  I have reviewed the triage vital signs and the nursing notes.  Pertinent labs & imaging results that were available during my care of the patient were reviewed by me and considered in my medical decision making (see chart for details).    MDM Rules/Calculators/A&P                          MDM:  Pt given morphine for pain,  Chest xray  Shows some fluid overload. I spoke to Cardiology who will see.  Cardiology saw pt and feels pt needs overnight diureses  Final Clinical Impression(s) / ED Diagnoses Final diagnoses:  Chest pain, unspecified type    Rx / DC Orders ED Discharge Orders    None       Sidney Ace 03/11/21 0630    Gareth Morgan,  MD 03/11/21 [id number]

## 2021-03-10 NOTE — Patient Instructions (Signed)
RESTART Potassium 20 meq (two tabs) twice a day RESTART Spironolactone 25 mg, one tab daily   You have been referred to [facility] for further CHF management in the comfort of your home. A team member will be in contact with you to arrange a home visit  Your physician recommends that you schedule a follow-up appointment in: 2-4 weeks  in the Advanced Practitioners (PA/NP) Clinic   Do the following things EVERYDAY: 1) Weigh yourself in the morning before breakfast. Write it down and keep it in a log. 2) Take your medicines as prescribed 3) Eat low salt foods--Limit salt (sodium) to 2000 mg per day.  4) Stay as active as you can everyday 5) Limit all fluids for the day to less than 2 liters At the [facility], you and your health needs are our priority. As part of our continuing mission to provide you with exceptional heart care, we have created designated Provider Care Teams. These Care Teams include your primary Cardiologist (physician) and Advanced Practice Providers (APPs- Physician Assistants and Nurse Practitioners) who all work together to provide you with the care you need, when you need it.   You may see any of the following providers on your designated Care Team at your next follow up: Marland Kitchen Dr Glori Bickers . Dr Loralie Champagne . Dr Vickki Muff . [name], NP . Lyda Jester, [place] . Audry Riles, PharmD   Please be sure to bring in all your medications bottles to every appointment.   If you have any questions or concerns before your next appointment please send [place] a message through [place] or call our office at [phone].    TO LEAVE A MESSAGE FOR THE NURSE SELECT OPTION 2, PLEASE LEAVE A MESSAGE INCLUDING: . YOUR NAME . DATE OF BIRTH . CALL BACK NUMBER . REASON FOR CALL**this is important as we prioritize the call backs  YOU WILL RECEIVE A CALL BACK THE SAME DAY AS LONG AS YOU CALL BEFORE 4:00  PM  Please see our updated No Show and Same Day Appointment Cancellation Policy attached to your AVS.

## 2021-03-10 NOTE — ED Notes (Signed)
BNP will be added to blood already collected.

## 2021-03-10 NOTE — H&P (Addendum)
Cardiology Admission History and Physical:   Patient ID: Tracey Manning MRN: [id number]; DOB: 01/17/1961   Admission date: 03/10/2021  PCP:  Nolene Ebbs, MD   [place]  Cardiologist:  Quay Burow, MD    Chief Complaint: CP  Patient Profile:   Tracey Manning is a 60 y.o. female with history of CAD s/p multiple PCI, chronic systolic heart failure secondary to ischemic cardiomyopathy, COPD with ongoing tobacco smoking, hypertension, hyperlipidemia, borderline diabetes mellitus, obstructive sleep apnea (intolerance to CPAP) and morbid obesity sent from heart failure clinic for evaluation of chest pain.  History of CAD s/p remote PCI in [place] in 2004.  Circumflex PCI by Dr. Claiborne Billings in 2008.  She has known occluded RCA with left-to-right collaterals.  History of LAD stent by Dr. Claiborne Billings in 2012.  Cardiac catheterization July 2020 showed 60 to 70% mid circumflex stenosis, known RCA with left-to-right collaterals, 95% proximal LAD stenosis before the previously placed stents which is 50 to 60% narrowed.  Patient was turned down for CABG and eventually underwent intervention to LAD.  Admitted 02/23/21 with increased shortness of breath. Diuresed with IV lasix and discharged the next day.    [place] 12/21:  1. Sinus rhythm - avg HR of 90 bpm. 2. One 6-beat run NSVT 3. Five runs of SVT - the longest lasting 24.6 secs with an avg rate of 119 bpm. Isolated SVEs were rare (<1.0%) 4. Occasional PVCs (4.0%, [id number]) - however on one day there was 10.5% burden and another ay 8.7% burden.    ECHO 02/2021: EF 30-35%  Echo 06/19/20: EF 40-45% RV ok   History of Present Illness:   [name] seen in heart failure clinic this morning for routine follow-up.  Patient reported shortness of breath with activity.  Overall feeling well.  At the end of visit during checkout patient had left-sided chest pain and sent to ER for further evaluation.  Patient reports 10 out of 10  chest pressure.  Her pain started at left forearm and radiates to left neck and then to her left upper chest.  Describes some nausea.  Patient reports intolerance to Imdur secondary to "severe migraine". She got a sample of Entresto 49/51 mg on Friday>> after taking 1 dose she had 2 hours of palpitation and stopped it.  Otherwise, patient reports compliance with her medications.  Reports low-sodium diet.  She does feels intermittent chest pain and shortness of breath.  Patient was tearful during conversation and unable to provide detailed information.  Potassium 3.1 Creatinine 1.33 (baseline SCr 1.1-1.3 with probable CKD [name]) BNP 571 High-sensitivity troponin 23 Glucose 95 CXR with Cardiomegaly with increasing interstitial edema compatible with congestive heart failure.  Patient got IV morphine 4 mg x 2 and Lasix 40 mg x 1.  Past Medical History:  Diagnosis Date   Anxiety    Arthritis    Asthma    Barrett's esophagus    Chest pain    Collagen vascular disease ([place])    Colon polyps    Coronary artery disease    Depression    Dyslipidemia    GERD (gastroesophageal reflux disease)    occ   Hyperlipidemia    Hypertension    Myocardial infarct ([place]) 4970,2637   Sciatica    Sleep apnea    no CPAP ordered but using oxygen at bedtime   Tobacco abuse     Past Surgical History:  Procedure Laterality Date   CARDIAC CATHETERIZATION  10/09/2003   [facility] [place]) - LAD with 30% prox narrowing, 50% stenosis in mid-portion of PLA; RCA with 40% narrowing proximally ([name], [name])   CARDIAC CATHETERIZATION  10/10/2007   70% stenosis in first septal perforator branch of LAD, 60-70% narrowing in mid LAD, 20% narrowing in mid AV groove Cfx with 80% diffuse narrowing in small distal marginal, total occlusion of mid RCA with L to R collaterals, 90% stenosis diffusely in prox branch of RCA followed by 70% stenosis in secondary curve & 80% stenosis in small marginal  branch ([name])   CARDIAC CATHETERIZATION  05/28/2008   normal L main, RCA with 100% prox lesion w/distal filling from LAD collaterals, LAD with 20% prox tubular lesion/ 40% mid LAD lesion/previous stent patent ([name])   CARDIAC CATHETERIZATION  09/15/2009   discrete 100% osital RCA lesion, 50% prox LAD lesion, non-obstructive disease in all coronaries ([name])   [place] W/ POLYPECTOMY     CORONARY ANGIOPLASTY WITH STENT PLACEMENT  10/31/2007   PCI of distal Cfx with 2.25x67m Taxus Adam DES, 60% narrowing of mid LAD (Dr. TCorky Downs   CORONARY ANGIOPLASTY WITH STENT PLACEMENT  06/11/2011   PCI of prox-mid LAD with 3x191mDES Resolute (Dr. T.[name]  CORONARY STENT INTERVENTION N/A 06/19/2019   Procedure: CORONARY STENT INTERVENTION;  Surgeon: BeLorretta HarpMD;  [facility];  Service: Cardiovascular;  Laterality: N/A;  LAD   DILATION AND CURETTAGE, DIAGNOSTIC / THERAPEUTIC     DILITATION & CURRETTAGE/HYSTROSCOPY WITH HYDROTHERMAL ABLATION N/A 04/03/2016   Procedure: DILATATION & CURETTAGE/HYSTEROSCOPY WITH HYDROTHERMAL ABLATION;  Surgeon: ChShelly BombardMD;  [facility];  Service: Gynecology;  Laterality: N/A;   NM MYOCAR PERF WALL MOTION  08/2009   persantine myoview - normal perfusion in all regions, perfusion defect in anterior region (breast attenuation), EF 52%, low risk scan   RIGHT/LEFT HEART CATH AND CORONARY ANGIOGRAPHY N/A 06/15/2019   Procedure: RIGHT/LEFT HEART CATH AND CORONARY ANGIOGRAPHY;  Surgeon: SmBelva CromeMD;  [facility];  Service: Cardiovascular;  Laterality: N/A;     Medications Prior to Admission: Prior to Admission medications   Medication Sig Start Date End Date Taking? Authorizing Provider  acetaminophen (TYLENOL) 500 MG tablet TAKE 2 TABLETS BY MOUTH EVERY 6 HOURS AS NEEDED Patient taking differently: Take 500-1,000 mg by mouth every 6 (six) hours as needed  for moderate pain. 10/09/20 10/09/21  AvNolene EbbsMD  albuterol (PROVENTIL) (2.5 MG/3ML) 0.083% nebulizer solution INHALE 1 VIAL VIA NEBULIZER EVERY 6 HOURS AS NEEDED Patient taking differently: Take 2.5 mg by nebulization every 6 (six) hours as needed for wheezing or shortness of breath. 02/21/21 02/21/22  AvNolene EbbsMD  albuterol (VENTOLIN HFA) 108 (90 Base) MCG/ACT inhaler INHALE 2 PUFFS EVERY 4 HOURS AS NEEDED 02/21/21 02/21/22  AvNolene EbbsMD  aspirin EC 81 MG EC tablet Take 1 tablet (81 mg total) by mouth daily. 06/21/19   HaKayleen MemosDO  atorvastatin (LIPITOR) 80 MG tablet TAKE 1 TABLET BY MOUTH ONCE A DAY 12/18/20 12/18/21  AvNolene EbbsMD  benzonatate (TESSALON) 100 MG capsule Take 1 capsule (100 mg total) by mouth 2 (two) times daily. 02/24/21   Mikhail, MaVelta AddisonDO  cetirizine (ZYRTEC) 10 MG tablet TAKE 1 TABLET BY MOUTH ONCE A DAY AS NEEDED Patient taking differently: Take 10 mg by mouth daily as needed for allergies. 02/21/21 02/21/22  Nolene Ebbs, MD  clopidogrel (PLAVIX) 75 MG tablet TAKE 1 TABLET BY MOUTH ONCE A DAY 12/18/20 12/18/21  Nolene Ebbs, MD  cyclobenzaprine (FLEXERIL) 10 MG tablet Take 1 tablet (10 mg total) by mouth 2 (two) times daily as needed for muscle spasms. 08/22/20   Domenic Moras, PA-C  dapagliflozin propanediol (FARXIGA) 10 MG TABS tablet TAKE 1 TABLET BY MOUTH DAILY BEFORE BREAKFAST 10/21/20 10/21/21  Bensimhon, Shaune Pascal, MD  diclofenac Sodium (VOLTAREN) 1 % GEL APPLY 4 GRAMS FOUR TIMES DAILY AS NEEDED FOR PAINS AS DIRECTED 09/11/20 09/11/21  Nolene Ebbs, MD  Evolocumab 140 MG/ML SOAJ INJECT 1 DOSE INTO THE SKIN EVERY 14 (FOURTEEN) DAYS. Patient not taking: Reported on 03/10/2021 01/29/21 01/29/22  Pixie Casino, MD  fluticasone-salmeterol (ADVAIR HFA) [phone] MCG/ACT inhaler Inhale 2 puffs into the lungs daily as needed.    [provider]  Fluticasone-Umeclidin-Vilant (TRELEGY ELLIPTA) 200-62.5-25 MCG/INH AEPB Inhale 1 puff into the lungs daily.     [provider]  furosemide (LASIX) 40 MG tablet TAKE 1 TABLET BY MOUTH DAILY. 10/21/20 10/21/21  Bensimhon, Shaune Pascal, MD  hydrALAZINE (APRESOLINE) 50 MG tablet TAKE 1 TABLET BY MOUTH 3 TIMES DAILY. 12/16/20 12/16/21  MilfordMaricela Bo, FNP  NARCAN 4 MG/0.1ML LIQD nasal spray kit Place 1 spray into the nose once. Overdose 09/01/18   [provider]  nitroGLYCERIN (NITROSTAT) 0.4 MG SL tablet DISSOLVE 1 TABLET UNDER THE TONGUE AS NEEDED AS DIRECTED 12/18/20 12/18/21  Nolene Ebbs, MD  ofloxacin (OCUFLOX) 0.3 % ophthalmic solution INSTILL 1 DROP 3 TIMES DAILY IN OPERATIVE EYE STARTING 2 DAYS PRIOR TO SURGERY AND AFTER SURGERY FOR 3 WEEKS. 11/29/20 11/29/21  Rutherford Guys, MD  potassium chloride (KLOR-CON) 10 MEQ tablet TAKE 2 TABLETS BY MOUTH 2 TIMES DAILY 03/10/21 03/10/22  Clegg, [name] D, NP  prednisoLONE acetate (PRED FORTE) 1 % ophthalmic suspension APPLY ONE DROP IN OPERATIVE EYE(S) 3 TIMES DAILY STARTING 2 DAYS BEFORE SURGERY AND AFTER SURGERY FOR 3 WEEKS 11/29/20 11/29/21  Rutherford Guys, MD  sacubitril-valsartan (ENTRESTO) 97-103 MG Take 1 tablet by mouth 2 (two) times daily. 03/10/21   Clegg, [name] D, NP  spironolactone (ALDACTONE) 25 MG tablet TAKE 1 TABLET BY MOUTH DAILY 03/10/21 03/10/22  Clegg, [name] D, NP  Vitamin D, Ergocalciferol, (DRISDOL) 1.25 MG (50000 UNIT) CAPS capsule TAKE 1 CAPSULE BY MOUTH ONCE A WEEK Patient taking differently: Take 50,000 Units by mouth once a week. 02/21/21 02/21/22  Nolene Ebbs, MD  zolpidem (AMBIEN) 10 MG tablet TAKE 1 TABLET BY MOUTH AT BEDTIME AS NEEDED FOR SLEEP 02/21/21 08/20/21  Nolene Ebbs, MD     Allergies:    Allergies  Allergen Reactions   Coreg [Carvedilol] Other (See Comments)    Headache   Other Anaphylaxis and Hives    Fresh strawberries (throat swelled)   Imdur [Isosorbide Nitrate] Other (See Comments)    Headache     Amoxicillin Nausea And Vomiting    Did it involve swelling of the face/tongue/throat, SOB, or low BP? Yes Did it involve  sudden or severe rash/hives, skin peeling, or any reaction on the inside of your mouth or nose? Yes Did you need to seek medical attention at a hospital or doctor's office? Yes When did it last happen?      within last 10 years If all above answers are "NO", may proceed with cephalosporin use.    Dilaudid [Hydromorphone Hcl] Nausea And Vomiting   Ibuprofen Other (See Comments)    wheezing   Latex  Swelling    No reaction with bandaids   Metoprolol     HEADACHE and Wheezing    Social History:   Social History   Socioeconomic History   Marital status: Single    Spouse name: Not on file   Number of children: 2   Years of education: 12   Highest education level: High school graduate  Occupational History   Not on file  Tobacco Use   Smoking status: Current Every Day Smoker    Packs/day: 0.50    Years: 34.00    Pack years: 17.00    Types: Cigarettes   Smokeless tobacco: Never Used  Vaping Use   Vaping Use: Never used  Substance and Sexual Activity   Alcohol use: No    Alcohol/week: 0.0 standard drinks    Comment: seldom   Drug use: Yes    Types: Marijuana    Comment: 06/19/19   Sexual activity: Yes    Partners: Male    Birth control/protection: Post-menopausal  Other Topics Concern   Not on file  Social History Narrative   Not on file   Social Determinants of Health   Financial Resource Strain: Medium Risk   Difficulty of Paying Living Expenses: Somewhat hard  Food Insecurity: Landscape architect Present   Worried About Charity fundraiser in the Last Year: Sometimes true   Arboriculturist in the Last Year: Sometimes true  Transportation Needs: Unmet Transportation Needs   Lack of Transportation (Medical): No   Lack of Transportation (Non-Medical): Yes  Physical Activity: Not on file  Stress: Not on file  Social Connections: Not on file  Intimate Partner Violence: Not on file    Family History:   The patient's family history includes Bleeding Disorder in her son;  Clotting disorder in her father; Colon cancer in her paternal grandmother; Diabetes in her paternal grandmother and sister; Hypertension in her sister; Leukemia in her mother; Stroke (age of onset: 68) in her sister. There is no history of Stomach cancer or Pancreatic cancer.    ROS:  Please see the history of present illness.  All other ROS reviewed and negative.     Physical Exam/Data:   Vitals:   03/10/21 1200 03/10/21 1204 03/10/21 1215 03/10/21 1430  BP:  (!) 141/98 (!) 161/102 (!) 154/111  Pulse:   96 81  Resp: (!) _0 (!) 21  Temp:      TempSrc:      SpO2:  100% 100% 100%   No intake or output data in the 24 hours ending 03/10/21 1542 Last 3 Weights 03/10/2021 02/24/2021 02/23/2021  Weight (lbs) 202 lb 3.2 oz 197 lb 11.2 oz 197 lb  Weight (kg) 91.717 kg 89.676 kg 89.359 kg     There is no height or weight on file to calculate BMI.  General:  Well nourished, well developed, in no acute distress HEENT: normal Lymph: no adenopathy Neck: no JVD Endocrine:  No thryomegaly Vascular: No carotid bruits; FA pulses 2+ bilaterally without bruits  Cardiac:  normal S1, S2; RRR; no murmur  Lungs:  clear to auscultation bilaterally, no wheezing, rhonchi or rales  Abd: soft, nontender, no hepatomegaly  Ext: no edema Musculoskeletal:  No deformities, BUE and BLE strength normal and equal Skin: warm and dry  Neuro:  CNs 2-12 intact, no focal abnormalities noted Psych:  Normal affect    EKG:  The ECG that was done today  was personally reviewed and demonstrates NSR and PVCs  Relevant CV Studies:  Echo 02/24/21 1. Global hypokinesis worse in the inferior wall. Left ventricular  ejection fraction, by estimation, is 30 to 35%. The left ventricle has  moderately decreased function. The left ventricle demonstrates global  hypokinesis. The left ventricular internal  cavity size was moderately dilated. Left ventricular diastolic parameters  are consistent with Grade II diastolic  dysfunction (pseudonormalization).  Elevated left ventricular end-diastolic pressure.   2. Right ventricular systolic function is normal. The right ventricular  size is normal. There is normal pulmonary artery systolic pressure.   3. Left atrial size was moderately dilated.   4. The pericardial effusion is posterior to the left ventricle.   5. MV coaptation apically displaced from LV dysfunction . The mitral  valve is abnormal. Mild to moderate mitral valve regurgitation. No  evidence of mitral stenosis.   6. The aortic valve is tricuspid. There is moderate calcification of the  aortic valve. Aortic valve regurgitation is not visualized. Mild to  moderate aortic valve sclerosis/calcification is present, without any  evidence of aortic stenosis.   7. The inferior vena cava is normal in size with greater than 50%  respiratory variability, suggesting right atrial pressure of 3 mmHg.     CORONARY STENT INTERVENTION  06/19/2019    Conclusion    Ost RCA to Prox RCA lesion is 100% stenosed. Previously placed Prox Cx to Mid Cx stent (unknown type) is widely patent. Previously placed Prox LAD to Mid LAD stent (unknown type) is widely patent. Ost LAD lesion is 90% stenosed. Prox LAD lesion is 90% stenosed. A stent was successfully placed. Post intervention, there is a 0% residual stenosis. A stent was successfully placed. Post intervention, there is a 0% residual stenosis.   Tracey Manning is a 60 y.o. female  IMPRESSION: Successful ostial/proximal LAD stenting in the setting of severe LV dysfunction and an occluded RCA with left-to-right collaterals after having been turned down for CABG.  Patient will be gently hydrated overnight and discharged home in the morning on DAPT.   Laboratory Data:  High Sensitivity Troponin:   Recent Labs  Lab 02/23/21 1158 02/23/21 1340 02/23/21 2008 03/10/21 1114 03/10/21 1314  TROPONINIHS 23* 22* 19* 23* 17      Chemistry Recent Labs  Lab  03/10/21 1114  NA 140  K 3.1*  CL 99  CO2 32  GLUCOSE 95  BUN 15  CREATININE 1.33*  CALCIUM 9.6  GFRNONAA 46*  ANIONGAP 9    Hematology Recent Labs  Lab 03/10/21 1114  WBC 11.2*  RBC 4.83  HGB 13.8  HCT 42.6  MCV 88.2  MCH 28.6  MCHC 32.4  RDW 17.0*  PLT 239   BNP Recent Labs  Lab 03/10/21 1154  BNP 571.0*    Radiology/Studies:  DG Chest 2 View  Result Date: 03/10/2021 CLINICAL DATA:  Chest pain radiating to the left. Shortness of breath. EXAM: CHEST - 2 VIEW COMPARISON:  One-view chest x-ray 02/23/2021 FINDINGS: The heart is enlarged. Increasing interstitial pattern is consistent with edema. No effusions are present. No significant airspace consolidation is present. Minimal bibasilar atelectasis noted. Mild degenerative changes are noted in the midthoracic spine. IMPRESSION: Cardiomegaly with increasing interstitial edema compatible with congestive heart failure. Electronically Signed   By: San Morelle M.D.   On: 03/10/2021 12:37     Assessment and Plan:   Chest pain /elevated troponin - Patient reports intermittent chest pain.  Her current episode started after checking out from heart failure clinic. CP seems atypical.  EKG without acute changes. Admit and cycle troponin.   2. CAD s/p multiple stent - CP does not sounds like angina -Continue aspirin and statin  3. Acute on chronic systolic and diastolic CHF - LVEF of 58-85% on 02/24/21 with grade 2 diastolic dysfunction and elevated LVEDP .-BNP minimally elevated.  Chest x-ray with pulmonary edema -Patient got IV Lasix 40 mg in the emergency room -She stopped Imdur secondary to migraine.  She could not refill her [name] as it required prior authorization, eventually got sample from clinic on Friday but had palpitation and stopped it. -Intolerance to metoprolol and Coreg -Plan is to start bisoprolol, Entresto and spironolactone (case manager to help with cost).  Will need 30-day supply from [facility]  and medication assistance. - Continue Farxiga   4.  Hypertension -Elevated -Start Entresto and bisoprolol as above -Can add amlodipine if needed  5.  PVC -Monitor result as above -Beta-blocker as above -Unable to add amiodarone secondary to lung disease  6.  COPD with ongoing tobacco smoking -No active wheezing  7.  Morbid obesity -Encouraged weight loss  8.  Tearfulness -Patient seems depressed.  Dr. Gwenlyn Found recommend psychiatric evaluation during admission.  9. AKI with possible CKD  -Creatinine 1.33 (baseline SCr 1.1-1.3 with probable CKD [name]) - Followed with adding above meds   Risk Assessment/Risk Scores:   HEAR Score (for undifferentiated chest pain):  HEAR Score: 4  New York Heart Association (NYHA) Functional Class NYHA Class II    Severity of Illness: The appropriate patient status for this patient is OBSERVATION. Observation status is judged to be reasonable and necessary in order to provide the required intensity of service to ensure the patient's safety. The patient's presenting symptoms, physical exam findings, and initial radiographic and laboratory data in the context of their medical condition is felt to place them at decreased risk for further clinical deterioration. Furthermore, it is anticipated that the patient will be medically stable for discharge from the hospital within 2 midnights of admission. The following factors support the patient status of observation.   " The patient's presenting symptoms include  CP and Dyspnea. " The physical exam findings include None " The initial radiographic and laboratory data are Elevated BNP and abnormal chest x-ray     For questions or updates, please contact [facility] Please consult [email or web address] for contact info under     Jarrett Soho, PA  03/10/2021 3:42 PM   Agree with note by Robbie Lis PA-C  Pt well known to me with [facility] followed in the advanced CHF clinic. I last saw in office  11/21. I last cathed hr 2 years ago revealing a chronically occluded ND RCA, patent LCX OM stent and high grade ostial/prox LAD disease which I stented. She has continued to smoke. She lives alone and has issues with affording meds and compliance. Today in the CHF clinis she was c/o atyp CP which has occurred only recently. She has many medication intolerances including imdur and mult BB. Exam is benign although P mod elevated. EKG w/o acute chenges. Trop low. I have a low suspicion that this represents ACS. She is tearful and I think would benefit from Psych eval while in hosp as well as SS consult. Will restart bisoprolol and Entresto. May need addition of hydralazine or amlodipine for HTN. Home when meds are approp titrated. No need cor cath at this time.  [name], M.D., [place], [facility], [place] [address]  [place], [place]  [address]  [phone] 03/10/2021 4:51 PM

## 2021-03-10 NOTE — ED Triage Notes (Signed)
Pt states she was at the heart failure clinic for her appt and started having pain in her L fingertips that radiates up L arm and neck and into L chest.  Reports mild SOB.  Denies nausea and vomiting.

## 2021-03-10 NOTE — Plan of Care (Signed)

## 2021-03-10 NOTE — ED Notes (Signed)
Pure Wick was placed on patient. 

## 2021-03-10 NOTE — Progress Notes (Signed)
Patient reported chest pain rated 10. Patient is A&OX4. EKG performed displayed SR with occasional PVCs. RN notified Dr. Vickki Muff, cardiologist of all of this. See orders.

## 2021-03-10 NOTE — Progress Notes (Addendum)
CSW consulted to speak with pt as she was expressing mental health concerns during clinic visit.  Pt reports she just woke up this morning and has been upset on and off all day.  Pt states this has happened before but doesn't happen often- last time was in February but states this corresponded with the loss of a family member.  Doesn't feel actively depressed and does not report recent signs or symptoms of depression- just very tearful.  Does state she stopped smoking yesterday and has been having very constant pain which has gotten worse today and spread to other areas of her body.  Thinks these stressors might be contributing to her mood today.  CSW reported new left arm pain to medical staff and after evaluation they decided to send her to ED.  CSW will follow up with pt regarding mental health concerns once current medical concerns are assessed and resolved  Jorge Ny, [facility] Desk#: [phone] Cell#: [phone]

## 2021-03-11 DIAGNOSIS — I255 Ischemic cardiomyopathy: Secondary | ICD-10-CM

## 2021-03-11 DIAGNOSIS — F32A Depression, unspecified: Secondary | ICD-10-CM | POA: Diagnosis not present

## 2021-03-11 DIAGNOSIS — R079 Chest pain, unspecified: Secondary | ICD-10-CM

## 2021-03-11 DIAGNOSIS — I5043 Acute on chronic combined systolic (congestive) and diastolic (congestive) heart failure: Secondary | ICD-10-CM | POA: Diagnosis not present

## 2021-03-11 LAB — BASIC METABOLIC PANEL
Anion gap: 7 (ref 5–15)
BUN: 16 mg/dL (ref 6–20)
CO2: 29 mmol/L (ref 22–32)
Calcium: 9.2 mg/dL (ref 8.9–10.3)
Chloride: 103 mmol/L (ref 98–111)
Creatinine, Ser: 1.23 mg/dL — ABNORMAL HIGH (ref 0.44–1.00)
GFR, Estimated: 51 mL/min — ABNORMAL LOW (ref >60)
Glucose, Bld: 112 mg/dL — ABNORMAL HIGH (ref 70–99)
Potassium: 3.5 mmol/L (ref 3.5–5.1)
Sodium: 139 mmol/L (ref 135–145)

## 2021-03-11 LAB — CBC
HCT: 40 % (ref 36.0–46.0)
Hemoglobin: 13.3 g/dL (ref 12.0–15.0)
MCH: 28.6 pg (ref 26.0–34.0)
MCHC: 33.3 g/dL (ref 30.0–36.0)
MCV: 86 fL (ref 80.0–100.0)
Platelets: 229 10*3/uL (ref 150–400)
RBC: 4.65 MIL/uL (ref 3.87–5.11)
RDW: 16.8 % — ABNORMAL HIGH (ref 11.5–15.5)
WBC: 8.1 10*3/uL (ref 4.0–10.5)
nRBC: 0 % (ref 0.0–0.2)

## 2021-03-11 MED ORDER — ENSURE ENLIVE PO LIQD: 237.0000 mL | Freq: Two times a day (BID) | ORAL | Status: DC

## 2021-03-11 MED ORDER — ZOLPIDEM TARTRATE 5 MG PO TABS
5.0000 mg | ORAL_TABLET | Freq: Every evening | ORAL | Status: DC | PRN
  Administered 2021-03-11: 5 mg via ORAL
  Filled 2021-03-11: qty 1

## 2021-03-11 MED ORDER — ADULT MULTIVITAMIN W/MINERALS CH
1.0000 | ORAL_TABLET | Freq: Every day | ORAL | Status: DC
  Administered 2021-03-11 – 2021-03-12 (×2): 1 via ORAL
  Filled 2021-03-11 (×2): qty 1

## 2021-03-11 MED ORDER — FUROSEMIDE 10 MG/ML IJ SOLN
40.0000 mg | Freq: Once | INTRAMUSCULAR | Status: AC
  Administered 2021-03-11: 40 mg via INTRAVENOUS
  Filled 2021-03-11: qty 4

## 2021-03-11 NOTE — Progress Notes (Signed)
Initial Nutrition Assessment  DOCUMENTATION CODES:   Obesity unspecified  INTERVENTION:  Ensure Enlive po BID, each supplement provides 350 kcal and 20 grams of protein  MVI with minerals daily  NUTRITION DIAGNOSIS:   Predicted suboptimal nutrient intake related to other (see comment) (shortness of breath) as evidenced by per patient/family report.  GOAL:   Patient will meet greater than or equal to 90% of their needs    MONITOR:   PO intake,Supplement acceptance,Labs,Weight trends,I & O's  REASON FOR ASSESSMENT:   Malnutrition Screening Tool    ASSESSMENT:   Pt with a history of CAD s/p multiple PCI, chronic systolic heart failure secondary to ischemic cardiomyopathy, COPD with ongoing tobacco smoking, hypertension, hyperlipidemia, borderline diabetes mellitus, obstructive sleep apnea (intolerance to CPAP) sent from heart failure clinic for evaluation of chest pain.  Per Cardiology, CP atypical, trop negative, MD notes likely 2/2 musculoskeletal pain.  Pt unavailable at time of RD visit. PA discussed potential for pt to discharge home today, but pt requested to stay overnight due to fear regarding ongoing shortness of breath as she lives alone. PA notes pt will be re-evaluated for readiness to discharge in the morning.   PO Intake: 100% x 1 recorded meal  Reviewed weight history. No significant weight changes noted.   UOP: 368ml documented x24 hours  Medications: lasix, aldactone Labs reviewed.   Diet Order:   Diet Order            Diet Heart Room service appropriate? Yes; Fluid consistency: Thin  Diet effective now                 EDUCATION NEEDS:   No education needs have been identified at this time  Skin:  Skin Assessment: Reviewed RN Assessment  Last BM:  PTA  Height:   Ht Readings from Last 1 Encounters:  03/10/21 5\' 1"  (1.549 m)    Weight:   Wt Readings from Last 1 Encounters:  03/11/21 90.3 kg    BMI:  Body mass index is 37.62  kg/m.  Estimated Nutritional Needs:   Kcal:  1700-1900  Protein:  95-105 grams  Fluid:  >1.7L    Larkin Ina, MS, RD, LDN RD pager number and weekend/on-call pager number located in [place].

## 2021-03-11 NOTE — Progress Notes (Signed)
   03/11/21 1000  Mobility  Activity Refused mobility (Declined d/t pain. Will check back as time permits)

## 2021-03-11 NOTE — Progress Notes (Signed)
Progress Note  Patient Name: Tracey Manning Date of Encounter: 03/11/2021  [facility] Cardiologist: Quay Burow, MD   Subjective   Breathing is a little short this am  No CP  L arm hurts   Inpatient Medications    Scheduled Meds: . aspirin EC  81 mg Oral Daily  . atorvastatin  80 mg Oral Daily  . bisoprolol  5 mg Oral Daily  . clopidogrel  75 mg Oral Daily  . dapagliflozin propanediol  10 mg Oral Daily  . fluticasone furoate-vilanterol  1 puff Inhalation Daily  . furosemide  40 mg Oral Daily  . heparin  5,000 Units Subcutaneous Q8H  . sacubitril-valsartan  1 tablet Oral BID  . spironolactone  25 mg Oral Daily   Continuous Infusions:  PRN Meds: acetaminophen, albuterol, albuterol, nitroGLYCERIN, ondansetron (ZOFRAN) IV   Vital Signs    Vitals:   03/10/21 1957 03/11/21 0029 03/11/21 0444 03/11/21 0812  BP: (!) 151/116  (!) 139/93 (!) 125/92  Pulse: 90  60 74  Resp: 16  14 18   Temp: 98.2 F (36.8 C)  98 F (36.7 C) (!) 97.4 F (36.3 C)  TempSrc: Oral  Oral Oral  SpO2: 96%  99% 98%  Weight:  90.3 kg    Height:        Intake/Output Summary (Last 24 hours) at 03/11/2021 0846 Last data filed at 03/11/2021 0600 Gross per 24 hour  Intake 592 ml  Output 300 ml  Net 292 ml   Last 3 Weights 03/11/2021 03/10/2021 03/10/2021  Weight (lbs) 199 lb 1.6 oz 198 lb 3.1 oz 202 lb 3.2 oz  Weight (kg) 90.311 kg 89.9 kg 91.717 kg      Telemetry    SR - Personally Reviewed  ECG    -SR with occasional PVC   Occasional PVC Personally Reviewed  Physical Exam   GEN: No acute distress.   Neck: No JVD Cardiac: RRR, no murmurs, Respiratory: Some increased upper airway tightness   No rales   GI: Soft, nontender, non-distended  MS: No edema; No deformity.  Painful on palpation L upper chest and shoulder  Neuro:  Nonfocal  Psych: Normal affect   Labs    High Sensitivity Troponin:   Recent Labs  Lab 02/23/21 2008 03/10/21 1114 03/10/21 1314 03/10/21 1801  03/10/21 1938  TROPONINIHS 19* 23* 17 24* 22*      Chemistry Recent Labs  Lab 03/10/21 1114 03/10/21 1801 03/11/21 0347  NA 140  --  139  K 3.1*  --  3.5  CL 99  --  103  CO2 32  --  29  GLUCOSE 95  --  112*  BUN 15  --  16  CREATININE 1.33* 1.35* 1.23*  CALCIUM 9.6  --  9.2  GFRNONAA 46* 45* 51*  ANIONGAP 9  --  7     Hematology Recent Labs  Lab 03/10/21 1114 03/10/21 1801 03/11/21 0347  WBC 11.2* 9.8 8.1  RBC 4.83 5.01 4.65  HGB 13.8 14.1 13.3  HCT 42.6 43.3 40.0  MCV 88.2 86.4 86.0  MCH 28.6 28.1 28.6  MCHC 32.4 32.6 33.3  RDW 17.0* 16.9* 16.8*  PLT 239 263 229    BNP Recent Labs  Lab 03/10/21 1154  BNP 571.0*     DDimer No results for input(s): DDIMER in the last 168 hours.   Radiology    DG Chest 2 View  Result Date: 03/10/2021 CLINICAL DATA:  Chest pain radiating to the left. Shortness  of breath. EXAM: CHEST - 2 VIEW COMPARISON:  One-view chest x-ray 02/23/2021 FINDINGS: The heart is enlarged. Increasing interstitial pattern is consistent with edema. No effusions are present. No significant airspace consolidation is present. Minimal bibasilar atelectasis noted. Mild degenerative changes are noted in the midthoracic spine. IMPRESSION: Cardiomegaly with increasing interstitial edema compatible with congestive heart failure. Electronically Signed   By: San Morelle M.D.   On: 03/10/2021 12:37    Cardiac Studies   None   Patient Profile     60 y.o. female hx of CAD andICM   Admitted yesterday with CP  Admitted for eval and optimization of medical Rx .    Assessment & Plan     1  CP   Atypical   Trop negative   I think more radiating from musculoskeletal pain  2  ICM  Pt notes some SOB  Will dose with IV lasix once and follow  Reassess in PM today     OTher meds have been restarted   Tolerating   3  CAD  As above   I am not convinced of active angina   4  HL  COntinue statin  5  Psych   Pt with multipple stressors in [place], personal  situation   Tearful   Will see about getting her st up with counselling   Appears reactive     Possible d/c today  For questions or updates, please contact [facility] Please consult [email or web address] for contact info under        Signed, Dorris Carnes, MD  03/11/2021, 8:46 AM

## 2021-03-11 NOTE — Consult Note (Signed)
[facility] Consult   Reason for Consult:  Depression and tearful Referring Physician:  Dr. Gwenlyn Found Patient Identification: Tracey Manning MRN:  [id number] Principal Diagnosis: <principal problem not specified> Diagnosis:  Active Problems:   Acute on chronic combined systolic (congestive) and diastolic (congestive) heart failure ([place])   Total Time spent with patient: 30 minutes  Subjective:   Tracey Manning is a 60 y.o. female patient admitted with chest pain.  Patient patient is seen and evaluated face-to-face for this nurse practitioner.  Upon entering the room patient was observed to be laughing and jovial conversation on the phone.  She did end the conversation, and acknowledged my entry into the room.  After introducing self, patient began to discuss things that were going on in her life in which she immediately began to start crying. "  My oldest son is in prison for life.  My youngest son is working in the system.  My nephew the person who actually committed the crime, only did 10 years and is walking around with his head held high.  I can never get to hug my son again.  I also understand they will not be able to talk to her about their son again.  This guy was staying and went to rehab.  When he got out he acted like he did not know me, I do not know what I did to him to make him treat me that way.  But I do not have nobody that I can hug and kiss on."  Throughout this conversation she continues to cry uncontrollably, and then engage in regular conversation.  At one point in time, she did receive another phone call, in which she brightened up answered the call and then resumed her conversation with me.  She does show some fluctuations in moods throughout this evaluation that range from crying and angered, to sadden and euthymic.  Patient denies any previous history of psychiatric diagnosis.  She does report previous history of a therapist, in which she received [facility]  She states the sessions abruptly ended when they relocated, in which she lost contact with her previous therapist.  She states she does have difficulty at times consoling herself, and processing her thoughts.  She does appear to be open and motivated to receiving psychiatric services.  She denies any concern regarding her current safety and or the safety of others.  She denies any history of suicidal ideations, suicide attempts, and or self-harm.  She denies any illicit substance abuse, that could be contributing to her mood swings.  Urine drug screen not available at this time, 1 has been ordered by this provider.  She does appear to be psychiatrically stable, to discharge with outpatient resources.  HPI:  [name] seen in heart failure clinic this morning for routine follow-up.  Patient reported shortness of breath with activity.  Overall feeling well.  At the end of visit during checkout patient had left-sided chest pain and sent to ER for further evaluation.  Patient reports 10 out of 10 chest pressure.  Her pain started at left forearm and radiates to left neck and then to her left upper chest.  Describes some nausea.  Patient reports intolerance to Imdur secondary to "severe migraine". She got a sample of Entresto 49/51 mg on Friday>> after taking 1 dose she had 2 hours of palpitation and stopped it.  Otherwise, patient reports compliance with her medications.  Reports low-sodium diet.  She does feels intermittent  chest pain and shortness of breath.  Patient was tearful during conversation and unable to provide detailed information.  Past Psychiatric History: Denies any previous psychiatric diagnoses.  She denies any previous psychotropic medication.  She denies any previous or recent inpatient hospitalizations.  She reports [facility], and which they relocated and she lost contact.  She denies any illicit substance use,  however endorses use of marijuana" every now and then."  She reports intermittent use of alcohol " every now and then, 1 or 2 drinks on the weekend."  She denies any previous suicide attempts, self-harm behaviors, suicidal thoughts or ideations.  Risk to Self:  Denies Risk to Others:  Denies Prior Inpatient Therapy:  Denies Prior Outpatient Therapy:  Denies  Past Medical History:  Past Medical History:  Diagnosis Date  . Anxiety   . Arthritis   . Asthma   . Barrett's esophagus   . Chest pain   . Collagen vascular disease ([place])   . Colon polyps   . Coronary artery disease   . Depression   . Dyslipidemia   . GERD (gastroesophageal reflux disease)    occ  . Hyperlipidemia   . Hypertension   . Myocardial infarct ([place]) [id number]  . Sciatica   . Sleep apnea    no CPAP ordered but using oxygen at bedtime  . Tobacco abuse     Past Surgical History:  Procedure Laterality Date  . CARDIAC CATHETERIZATION  10/09/2003   [facility] [place]) - LAD with 30% prox narrowing, 50% stenosis in mid-portion of PLA; RCA with 40% narrowing proximally ([name], [name])  . CARDIAC CATHETERIZATION  10/10/2007   70% stenosis in first septal perforator branch of LAD, 60-70% narrowing in mid LAD, 20% narrowing in mid AV groove Cfx with 80% diffuse narrowing in small distal marginal, total occlusion of mid RCA with L to R collaterals, 90% stenosis diffusely in prox branch of RCA followed by 70% stenosis in secondary curve & 80% stenosis in small marginal branch ([name])  . CARDIAC CATHETERIZATION  05/28/2008   normal L main, RCA with 100% prox lesion w/distal filling from LAD collaterals, LAD with 20% prox tubular lesion/ 40% mid LAD lesion/previous stent patent ([name])  . CARDIAC CATHETERIZATION  09/15/2009   discrete 100% osital RCA lesion, 50% prox LAD lesion, non-obstructive disease in all coronaries ([name])  . CESAREAN SECTION    . COLONOSCOPY    . COLONOSCOPY  W/ POLYPECTOMY    . CORONARY ANGIOPLASTY WITH STENT PLACEMENT  10/31/2007   PCI of distal Cfx with 2.25x46mm Taxus Adam DES, 60% narrowing of mid LAD ([name])  . CORONARY ANGIOPLASTY WITH STENT PLACEMENT  06/11/2011   PCI of prox-mid LAD with 3x84mm DES Resolute ([name])  . CORONARY STENT INTERVENTION N/A 06/19/2019   Procedure: CORONARY STENT INTERVENTION;  Surgeon: [name], MD;  Location: [facility];  Service: Cardiovascular;  Laterality: N/A;  LAD  . DILATION AND CURETTAGE, DIAGNOSTIC / THERAPEUTIC    . DILITATION & CURRETTAGE/HYSTROSCOPY WITH HYDROTHERMAL ABLATION N/A 04/03/2016   Procedure: DILATATION & CURETTAGE/HYSTEROSCOPY WITH HYDROTHERMAL ABLATION;  Surgeon: [name], MD;  Location: [facility];  Service: Gynecology;  Laterality: N/A;  . NM MYOCAR PERF WALL MOTION  08/2009   persantine myoview - normal perfusion in all regions, perfusion defect in anterior region (breast attenuation), EF 52%, low risk scan  . RIGHT/LEFT HEART CATH AND CORONARY ANGIOGRAPHY  N/A 06/15/2019   Procedure: RIGHT/LEFT HEART CATH AND CORONARY ANGIOGRAPHY;  Surgeon: Belva Crome, MD;  Location: [facility];  Service: Cardiovascular;  Laterality: N/A;   Family History:  Family History  Problem Relation Age of Onset  . Leukemia Mother   . Clotting disorder Father        blood clot  . Hypertension Sister   . Diabetes Sister   . Stroke Sister 76  . Bleeding Disorder Son        ITP "free bleeding disorder"  . Colon cancer Paternal Grandmother   . Diabetes Paternal Grandmother   . Stomach cancer Neg Hx   . Pancreatic cancer Neg Hx    Family Psychiatric  History: Denies Social History:  Social History   Substance and Sexual Activity  Alcohol Use No  . Alcohol/week: 0.0 standard drinks   Comment: seldom     Social History   Substance and Sexual Activity  Drug Use Yes  . Types: Marijuana   Comment: 06/19/19    Social History   Socioeconomic History  .  Marital status: Single    Spouse name: Not on file  . Number of children: 2  . Years of education: 87  . Highest education level: High school graduate  Occupational History  . Not on file  Tobacco Use  . Smoking status: Current Every Day Smoker    Packs/day: 0.50    Years: 34.00    Pack years: 17.00    Types: Cigarettes  . Smokeless tobacco: Never Used  Vaping Use  . Vaping Use: Never used  Substance and Sexual Activity  . Alcohol use: No    Alcohol/week: 0.0 standard drinks    Comment: seldom  . Drug use: Yes    Types: Marijuana    Comment: 06/19/19  . Sexual activity: Yes    Partners: Male    Birth control/protection: Post-menopausal  Other Topics Concern  . Not on file  Social History Narrative  . Not on file   Social Determinants of Health   Financial Resource Strain: Medium Risk  . Difficulty of Paying Living Expenses: Somewhat hard  Food Insecurity: Food Insecurity Present  . Worried About Charity fundraiser in the Last Year: Sometimes true  . Ran Out of Food in the Last Year: Sometimes true  Transportation Needs: Unmet Transportation Needs  . Lack of Transportation (Medical): No  . Lack of Transportation (Non-Medical): Yes  Physical Activity: Not on file  Stress: Not on file  Social Connections: Not on file   Additional Social History:    Allergies:   Allergies  Allergen Reactions  . Coreg [Carvedilol] Other (See Comments)    Headaches  . Strawberry Extract Anaphylaxis, Hives, Swelling and Other (See Comments)    "Fresh strawberries" made the throat swell (2014)  . Imdur [Isosorbide Nitrate] Other (See Comments)    Headaches   . Amoxicillin Nausea And Vomiting    Did it involve swelling of the face/tongue/throat, SOB, or low BP? Yes Did it involve sudden or severe rash/hives, skin peeling, or any reaction on the inside of your mouth or nose? Yes Did you need to seek medical attention at a hospital or doctor's office? Yes When did it last  happen?within last 10 years If all above answers are "NO", may proceed with cephalosporin use.   . Dilaudid [Hydromorphone Hcl] Nausea And Vomiting  . Ibuprofen Other (See Comments)    Wheezing   . Latex Swelling and Other (See Comments)  No reaction with Band-Aids, though  . Metoprolol Other (See Comments)    Headaches and wheezing    Labs:  Results for orders placed or performed during the hospital encounter of 03/10/21 (from the past 48 hour(s))  Basic metabolic panel     Status: Abnormal   Collection Time: 03/10/21 11:14 AM  Result Value Ref Range   Sodium 140 135 - 145 mmol/L   Potassium 3.1 (L) 3.5 - 5.1 mmol/L   Chloride 99 98 - 111 mmol/L   CO2 32 22 - 32 mmol/L   Glucose, Bld 95 70 - 99 mg/dL    Comment: Glucose reference range applies only to samples taken after fasting for at least 8 hours.   BUN 15 6 - 20 mg/dL   Creatinine, Ser 1.33 (H) 0.44 - 1.00 mg/dL   Calcium 9.6 8.9 - 10.3 mg/dL   GFR, Estimated 46 (L) >60 mL/min    Comment: (NOTE) Calculated using the CKD-EPI Creatinine Equation (2021)    Anion gap 9 5 - 15    Comment: Performed at [facility] [address], [place], [place] [address]  CBC     Status: Abnormal   Collection Time: 03/10/21 11:14 AM  Result Value Ref Range   WBC 11.2 (H) 4.0 - 10.5 K/uL   RBC 4.83 3.87 - 5.11 MIL/uL   Hemoglobin 13.8 12.0 - 15.0 g/dL   HCT 42.6 36.0 - 46.0 %   MCV 88.2 80.0 - 100.0 fL   MCH 28.6 26.0 - 34.0 pg   MCHC 32.4 30.0 - 36.0 g/dL   RDW 17.0 (H) 11.5 - 15.5 %   Platelets 239 150 - 400 K/uL   nRBC 0.0 0.0 - 0.2 %    Comment: Performed at [facility], [place] [address], [place], [place] [address]  Troponin I (High Sensitivity)     Status: Abnormal   Collection Time: 03/10/21 11:14 AM  Result Value Ref Range   Troponin I (High Sensitivity) 23 (H) <18 ng/L    Comment: (NOTE) Elevated high sensitivity troponin I (hsTnI) values and significant  changes across serial measurements may suggest  ACS but many other  chronic and acute conditions are known to elevate hsTnI results.  Refer to the "Links" section for chest pain algorithms and additional  guidance. Performed at [facility], [place] [address], [place], [place] [address]   I-Stat beta hCG blood, ED     Status: None   Collection Time: 03/10/21 11:42 AM  Result Value Ref Range   I-stat hCG, quantitative <5.0 <5 mIU/mL   Comment 3            Comment:   GEST. AGE      CONC.  (mIU/mL)   <=1 WEEK        5 - 50     2 WEEKS       50 - 500     3 WEEKS       100 - 10,000     4 WEEKS     1,000 - 30,000        FEMALE AND NON-PREGNANT FEMALE:     LESS THAN 5 mIU/mL   Brain natriuretic peptide     Status: Abnormal   Collection Time: 03/10/21 11:54 AM  Result Value Ref Range   B Natriuretic Peptide 571.0 (H) 0.0 - 100.0 pg/mL    Comment: Performed at [facility] [address], [place], [place] [address]  Troponin  I (High Sensitivity)     Status: None   Collection Time: 03/10/21  1:14 PM  Result Value Ref Range   Troponin I (High Sensitivity) 17 <18 ng/L    Comment: (NOTE) Elevated high sensitivity troponin I (hsTnI) values and significant  changes across serial measurements may suggest ACS but many other  chronic and acute conditions are known to elevate hsTnI results.  Refer to the "Links" section for chest pain algorithms and additional  guidance. Performed at [facility], [place] [name], [place], [place] [address]   Resp Panel by RT-PCR (Flu A&B, Covid) Nasopharyngeal Swab     Status: None   Collection Time: 03/10/21  3:50 PM   Specimen: Nasopharyngeal Swab; Nasopharyngeal(NP) swabs in vial transport medium  Result Value Ref Range   SARS Coronavirus 2 by RT PCR NEGATIVE NEGATIVE    Comment: (NOTE) SARS-CoV-2 target nucleic acids are NOT DETECTED.  The SARS-CoV-2 RNA is generally detectable in upper respiratory specimens during the acute phase of infection. The lowest concentration of SARS-CoV-2  viral copies this assay can detect is 138 copies/mL. A negative result does not preclude SARS-Cov-2 infection and should not be used as the sole basis for treatment or other patient management decisions. A negative result may occur with  improper specimen collection/handling, submission of specimen other than nasopharyngeal swab, presence of viral mutation(s) within the areas targeted by this assay, and inadequate number of viral copies(<138 copies/mL). A negative result must be combined with clinical observations, patient history, and epidemiological information. The expected result is Negative.  Fact Sheet for Patients:  EntrepreneurPulse.com.au  Fact Sheet for Healthcare Providers:  IncredibleEmployment.be  This test is no t yet approved or cleared by the [place] FDA and  has been authorized for detection and/or diagnosis of SARS-CoV-2 by FDA under an Emergency Use Authorization (EUA). This EUA will remain  in effect (meaning this test can be used) for the duration of the COVID-19 declaration under Section 564(b)(1) of the Act, 21 U.S.C.section 360bbb-3(b)(1), unless the authorization is terminated  or revoked sooner.       Influenza A by PCR NEGATIVE NEGATIVE   Influenza B by PCR NEGATIVE NEGATIVE    Comment: (NOTE) The Xpert Xpress SARS-CoV-2/FLU/RSV plus assay is intended as an aid in the diagnosis of influenza from Nasopharyngeal swab specimens and should not be used as a sole basis for treatment. Nasal washings and aspirates are unacceptable for Xpert Xpress SARS-CoV-2/FLU/RSV testing.  Fact Sheet for Patients: EntrepreneurPulse.com.au  Fact Sheet for Healthcare Providers: IncredibleEmployment.be  This test is not yet approved or cleared by the [place] FDA and has been authorized for detection and/or diagnosis of SARS-CoV-2 by FDA under an Emergency Use Authorization (EUA). This EUA  will remain in effect (meaning this test can be used) for the duration of the COVID-19 declaration under Section 564(b)(1) of the Act, 21 U.S.C. section 360bbb-3(b)(1), unless the authorization is terminated or revoked.  Performed at [facility], [place] [address] [place], [place] [address]   CBC     Status: Abnormal   Collection Time: 03/10/21  6:01 PM  Result Value Ref Range   WBC 9.8 4.0 - 10.5 K/uL   RBC 5.01 3.87 - 5.11 MIL/uL   Hemoglobin 14.1 12.0 - 15.0 g/dL   HCT 43.3 36.0 - 46.0 %   MCV 86.4 80.0 - 100.0 fL   MCH 28.1 26.0 - 34.0 pg   MCHC 32.6 30.0 - 36.0 g/dL   RDW 16.9 (H)  11.5 - 15.5 %   Platelets 263 150 - 400 K/uL   nRBC 0.0 0.0 - 0.2 %    Comment: Performed at [facility], [place] [address] [place], [place] [address]  Creatinine, serum     Status: Abnormal   Collection Time: 03/10/21  6:01 PM  Result Value Ref Range   Creatinine, Ser 1.35 (H) 0.44 - 1.00 mg/dL   GFR, Estimated 45 (L) >60 mL/min    Comment: (NOTE) Calculated using the CKD-EPI Creatinine Equation (2021) Performed at [facility] [address], [place], [place] [address]   Troponin I (High Sensitivity)     Status: Abnormal   Collection Time: 03/10/21  6:01 PM  Result Value Ref Range   Troponin I (High Sensitivity) 24 (H) <18 ng/L    Comment: (NOTE) Elevated high sensitivity troponin I (hsTnI) values and significant  changes across serial measurements may suggest ACS but many other  chronic and acute conditions are known to elevate hsTnI results.  Refer to the "Links" section for chest pain algorithms and additional  guidance. Performed at [facility], [place] [address] [place], [place] [address]   Troponin I (High Sensitivity)     Status: Abnormal   Collection Time: 03/10/21  7:38 PM  Result Value Ref Range   Troponin I (High Sensitivity) 22 (H) <18 ng/L    Comment: (NOTE) Elevated high sensitivity troponin I (hsTnI) values and significant  changes across serial  measurements may suggest ACS but many other  chronic and acute conditions are known to elevate hsTnI results.  Refer to the "Links" section for chest pain algorithms and additional  guidance. Performed at [facility], [place] [address] [place], [place] [address]   Basic metabolic panel     Status: Abnormal   Collection Time: 03/11/21  3:47 AM  Result Value Ref Range   Sodium 139 135 - 145 mmol/L   Potassium 3.5 3.5 - 5.1 mmol/L   Chloride 103 98 - 111 mmol/L   CO2 29 22 - 32 mmol/L   Glucose, Bld 112 (H) 70 - 99 mg/dL    Comment: Glucose reference range applies only to samples taken after fasting for at least 8 hours.   BUN 16 6 - 20 mg/dL   Creatinine, Ser 1.23 (H) 0.44 - 1.00 mg/dL   Calcium 9.2 8.9 - 10.3 mg/dL   GFR, Estimated 51 (L) >60 mL/min    Comment: (NOTE) Calculated using the CKD-EPI Creatinine Equation (2021)    Anion gap 7 5 - 15    Comment: Performed at [facility] [address], [place], [place] [address]  CBC     Status: Abnormal   Collection Time: 03/11/21  3:47 AM  Result Value Ref Range   WBC 8.1 4.0 - 10.5 K/uL   RBC 4.65 3.87 - 5.11 MIL/uL   Hemoglobin 13.3 12.0 - 15.0 g/dL   HCT 40.0 36.0 - 46.0 %   MCV 86.0 80.0 - 100.0 fL   MCH 28.6 26.0 - 34.0 pg   MCHC 33.3 30.0 - 36.0 g/dL   RDW 16.8 (H) 11.5 - 15.5 %   Platelets 229 150 - 400 K/uL   nRBC 0.0 0.0 - 0.2 %    Comment: Performed at [facility], [place] [address], [place], [place] [address]    Current Facility-Administered Medications  Medication Dose Route Frequency Provider Last Rate Last Admin  . acetaminophen (TYLENOL) tablet 650 mg  650  mg Oral Q4H PRN Bhagat, Bhavinkumar, PA      . albuterol (PROVENTIL) (2.5 MG/3ML) 0.083% nebulizer solution 2.5 mg  2.5 mg Nebulization Q6H PRN Bhagat, Bhavinkumar, PA      . albuterol (VENTOLIN HFA) 108 (90 Base) MCG/ACT inhaler 2 puff  2 puff Inhalation Q4H PRN Bhagat, Bhavinkumar, PA      . aspirin EC tablet 81 mg  81 mg Oral Daily Bhagat,  Bhavinkumar, PA   81 mg at 03/11/21 1017  . atorvastatin (LIPITOR) tablet 80 mg  80 mg Oral Daily Bhagat, Bhavinkumar, PA   80 mg at 03/11/21 1017  . bisoprolol (ZEBETA) tablet 5 mg  5 mg Oral Daily Bhagat, Bhavinkumar, PA   5 mg at 03/11/21 1017  . clopidogrel (PLAVIX) tablet 75 mg  75 mg Oral Daily Bhagat, Bhavinkumar, PA   75 mg at 03/11/21 1017  . dapagliflozin propanediol (FARXIGA) tablet 10 mg  10 mg Oral Daily Bhagat, Bhavinkumar, PA   10 mg at 03/11/21 0610  . fluticasone furoate-vilanterol (BREO ELLIPTA) 100-25 MCG/INH 1 puff  1 puff Inhalation Daily Bhagat, Bhavinkumar, PA      . furosemide (LASIX) tablet 40 mg  40 mg Oral Daily Bhagat, Bhavinkumar, PA   40 mg at 03/11/21 1017  . heparin injection 5,000 Units  5,000 Units Subcutaneous Q8H Bhagat, Bhavinkumar, PA      . nitroGLYCERIN (NITROSTAT) SL tablet 0.4 mg  0.4 mg Sublingual Q5 min PRN Bhagat, Bhavinkumar, PA      . ondansetron (ZOFRAN) injection 4 mg  4 mg Intravenous Q6H PRN Bhagat, Bhavinkumar, PA      . sacubitril-valsartan (ENTRESTO) 49-51 mg per tablet  1 tablet Oral BID Bhagat, Bhavinkumar, PA   1 tablet at 03/11/21 1017  . spironolactone (ALDACTONE) tablet 25 mg  25 mg Oral Daily Bhagat, Bhavinkumar, PA   25 mg at 03/11/21 1017    Musculoskeletal: Strength & Muscle Tone: within normal limits Gait & Station: normal Patient leans: N/A            Psychiatric Specialty Exam:  Presentation  General Appearance: Appropriate for Environment; Well Groomed  Eye Contact:Good  Speech:Clear and Coherent; Normal Rate  Speech Volume:Increased  Handedness:Right   Mood and Affect  Mood:-- (good)  Affect:Tearful; Full Range; Inappropriate   Thought Process  Thought Processes:Coherent; Linear  Descriptions of Associations:Circumstantial  Orientation:Full (Time, Place and Person)  Thought Content:Logical; Perseveration  History of Schizophrenia/Schizoaffective disorder:No data recorded Duration of  Psychotic Symptoms:No data recorded Hallucinations:Hallucinations: None  Ideas of Reference:None  Suicidal Thoughts:Suicidal Thoughts: No  Homicidal Thoughts:Homicidal Thoughts: No   Sensorium  Memory:Immediate Good; Recent Good; Remote Good  Judgment:Good  Insight:Good   Executive Functions  Concentration:Good  Attention Span:Good  [place] of Knowledge:Good  Language:Good   Psychomotor Activity  Psychomotor Activity:Psychomotor Activity: Normal   Assets  Assets:Communication Skills; Housing; Leisure Time; Physical Health; Desire for Improvement; Financial Resources/Insurance; Resilience; Social Support   Sleep  Sleep:Sleep: Fair   Physical Exam: Physical Exam ROS Blood pressure (!) 125/92, pulse 74, temperature (!) 97.4 F (36.3 C), temperature source Oral, resp. rate 18, height 5\' 1"  (1.549 m), weight 90.3 kg, SpO2 98 %. Body mass index is 37.62 kg/m.  Treatment Plan Summary: Plan Will recommend starting hydroxyzine 25 mg p.o. 3 times daily as needed for anxiety and mood lability.  Patient at present denies depressive symptoms, " I just want a therapist.  I do not think I am depressed that I need medication.  I just need  somebody to talk too."   -Hydroxyzine 25 mg p.o. 3 times daily as needed for anxiety, if it is warranted.  At this time she denies any need for any additional medication. -Urine drug screen ordered. -In the future if patient does start on psychotropic medication, will recommend medications that are cardiac safe as she has a very extensive cardiac history and is at risk for prolonged QTC. -TOC referral to IOP at [facility], patient to benefit from intensive therapy and behavior modifications.  -We will update AVS to reflect follow-up providers to include [facility] and intensive outpatient programming at [facility]    Disposition: No evidence of imminent risk to  self or others at present.   Patient does not meet criteria for psychiatric inpatient admission. Supportive therapy provided about ongoing stressors. Refer to IOP. Discussed crisis plan, support from social network, calling 911, coming to the Emergency Department, and calling Suicide Hotline.  Suella Broad, FNP 03/11/2021 1:27 PM

## 2021-03-11 NOTE — Progress Notes (Signed)
   Followed up with patient regarding possible discharge home. Patient reports ongoing SOB though improved from this morning. She has not ambulated today. Patient prefers to be monitored overnight since she lives alone. Appreciate psych recommendations and assistance with outpatient follow-up. Encouraged ambulation today/tomorrow morning. Will re-evaluate readiness for discharge in the morning.   Abigail Butts, PA-C  03/11/21; 1:52 PM

## 2021-03-12 ENCOUNTER — Other Ambulatory Visit (HOSPITAL_COMMUNITY): Payer: Self-pay

## 2021-03-12 DIAGNOSIS — R079 Chest pain, unspecified: Secondary | ICD-10-CM | POA: Diagnosis not present

## 2021-03-12 DIAGNOSIS — I255 Ischemic cardiomyopathy: Secondary | ICD-10-CM | POA: Diagnosis not present

## 2021-03-12 MED ORDER — HYDROXYZINE HCL 25 MG PO TABS
25.0000 mg | ORAL_TABLET | Freq: Three times a day (TID) | ORAL | 0 refills | Status: DC | PRN
  Filled 2021-03-12: qty 30, 10d supply, fill #0

## 2021-03-12 MED ORDER — HYDROXYZINE HCL 25 MG PO TABS: 25.0000 mg | ORAL_TABLET | Freq: Three times a day (TID) | ORAL | Status: DC | PRN

## 2021-03-12 MED ORDER — SPIRONOLACTONE 25 MG PO TABS
ORAL_TABLET | Freq: Every day | ORAL | 3 refills | Status: DC
  Filled 2021-03-12 – 2021-04-16 (×4): qty 30, 30d supply, fill #0

## 2021-03-12 MED ORDER — CLOPIDOGREL BISULFATE 75 MG PO TABS
ORAL_TABLET | Freq: Every day | ORAL | 3 refills | Status: DC
  Filled 2021-03-12: qty 30, 30d supply, fill #0

## 2021-03-12 MED ORDER — FUROSEMIDE 40 MG PO TABS
ORAL_TABLET | Freq: Every day | ORAL | 3 refills | Status: DC
  Filled 2021-03-12: qty 30, 30d supply, fill #0

## 2021-03-12 MED ORDER — BISOPROLOL FUMARATE 5 MG PO TABS
5.0000 mg | ORAL_TABLET | Freq: Every day | ORAL | 3 refills | Status: DC
  Filled 2021-03-12 – 2021-03-26 (×4): qty 30, 30d supply, fill #0

## 2021-03-12 MED ORDER — POTASSIUM CHLORIDE CRYS ER 10 MEQ PO TBCR
20.0000 meq | EXTENDED_RELEASE_TABLET | Freq: Two times a day (BID) | ORAL | 11 refills | Status: DC
  Filled 2021-03-12 – 2021-04-16 (×3): qty 120, 30d supply, fill #0

## 2021-03-12 MED ORDER — NITROGLYCERIN 0.4 MG SL SUBL
SUBLINGUAL_TABLET | SUBLINGUAL | 5 refills | Status: AC
  Filled 2021-03-12: qty 25, 7d supply, fill #0

## 2021-03-12 MED ORDER — DAPAGLIFLOZIN PROPANEDIOL 10 MG PO TABS
ORAL_TABLET | Freq: Every day | ORAL | 3 refills | Status: DC
  Filled 2021-03-12 – 2021-04-16 (×3): qty 30, 30d supply, fill #0

## 2021-03-12 MED ORDER — ATORVASTATIN CALCIUM 80 MG PO TABS
ORAL_TABLET | Freq: Every day | ORAL | 3 refills | Status: DC
  Filled 2021-03-12: qty 30, 30d supply, fill #0

## 2021-03-12 MED ORDER — ENTRESTO 97-103 MG PO TABS
1.0000 | ORAL_TABLET | Freq: Two times a day (BID) | ORAL | 3 refills | Status: DC
  Filled 2021-03-12: qty 180, 90d supply, fill #0

## 2021-03-12 MED ORDER — GABAPENTIN 300 MG PO CAPS
300.0000 mg | ORAL_CAPSULE | Freq: Once | ORAL | Status: DC
  Filled 2021-03-12: qty 1

## 2021-03-12 MED ORDER — ASPIRIN 81 MG PO TBEC
81.0000 mg | DELAYED_RELEASE_TABLET | Freq: Every day | ORAL | 3 refills | Status: AC
  Filled 2021-03-12: qty 90, 90d supply, fill #0

## 2021-03-12 NOTE — Discharge Summary (Signed)
Discharge Summary    Patient ID: Tracey Manning MRN: [id number]; DOB: 1961-07-16  Admit date: 03/10/2021 Discharge date: 03/12/2021  PCP:  Nolene Ebbs, MD   [place]  Cardiologist:  Quay Burow, MD  Advanced Practice Provider:  No care team member to display Electrophysiologist:  None   Discharge Diagnoses    Principal Problem:   Acute on chronic combined systolic (congestive) and diastolic (congestive) heart failure (HCC) Active Problems:   Essential hypertension   Dyslipidemia, goal LDL below 70   CAD S/P multiple PCIs   Atypical chest pain   CRI (chronic renal insufficiency), stage 3 (moderate)   Impingement syndrome of left shoulder    Diagnostic Studies/Procedures    None   Laboratory Data:  High Sensitivity Troponin:   Last Labs          Recent Labs  Lab 02/23/21 1158 02/23/21 1340 02/23/21 2008 03/10/21 1114 03/10/21 1314  TROPONINIHS 23* 22* 19* 23* 17        Chemistry Last Labs      Recent Labs  Lab 03/10/21 1114  NA 140  K 3.1*  CL 99  CO2 32  GLUCOSE 95  BUN 15  CREATININE 1.33*  CALCIUM 9.6  GFRNONAA 46*  ANIONGAP 9      Hematology Last Labs      Recent Labs  Lab 03/10/21 1114  WBC 11.2*  RBC 4.83  HGB 13.8  HCT 42.6  MCV 88.2  MCH 28.6  MCHC 32.4  RDW 17.0*  PLT 239     BNP Last Labs      Recent Labs  Lab 03/10/21 1154  BNP 571.0*      Radiology/Studies:  DG Chest 2 View  Result Date: 03/10/2021 CLINICAL DATA:  Chest pain radiating to the left. Shortness of breath. EXAM: CHEST - 2 VIEW COMPARISON:  One-view chest x-ray 02/23/2021 FINDINGS: The heart is enlarged. Increasing interstitial pattern is consistent with edema. No effusions are present. No significant airspace consolidation is present. Minimal bibasilar atelectasis noted. Mild degenerative changes are noted in the midthoracic spine. IMPRESSION: Cardiomegaly with increasing interstitial edema compatible with  congestive heart failure. Electronically Signed   By: San Morelle M.D.   On: 03/10/2021 12:37     _____________   History of Present Illness     Tracey Manning is a 60 y.o. female with history of CAD s/p multiple PCI, chronic systolic heart failure secondary to ischemic cardiomyopathy, COPD with ongoing tobacco smoking, hypertension, hyperlipidemia, borderline diabetes mellitus, obstructive sleep apnea (intolerance to CPAP) and morbid obesity sent from heart failure clinic for evaluation of chest pain.  History of CAD s/p remote PCI in [place] in 2004.  Circumflex PCI by Dr. Claiborne Billings in 2008.  She has known occluded RCA with left-to-right collaterals.  History of LAD stent by Dr. Claiborne Billings in 2012.  Cardiac catheterization July 2020 showed 60 to 70% mid circumflex stenosis, known RCA with left-to-right collaterals, 95% proximal LAD stenosis before the previously placed stents which is 50 to 60% narrowed.  Patient was turned down for CABG and eventually underwent intervention to LAD.  Admitted 02/23/21 with increased shortness of breath. Diuresed with IV lasix and discharged the next day.  [place] 12/21:  1. Sinus rhythm - avg HR of 90 bpm. 2. One 6-beat run NSVT 3. Five runs of SVT - the longest lasting 24.6 secs with an avg rate of 119 bpm. Isolated SVEs were rare (<1.0%) 4. Occasional PVCs (4.0%, [id number]) - however on  one day there was 10.5% burden and another ay 8.7% burden.  ECHO 02/2021: EF 30-35% Echo 06/19/20:EF 40-45% RV ok   Hospital Course     Consultants: psych   1. Chest pain in patient with CAD s/p multiple stents: patient presented with atypical chest pain, likely MSK in etiology. HsTrop with low flat trend in the 20s, not c/w ACS. EKG non-ischemic. Likely referred pain from left shoulder/arm pain with significantly limited range of motion - Referred to sports medicine for further evaluation of shoulder pain - Continue aspirin and statin - Restarted on BBlocker  - hopeful she will tolerate bisoprolol  2. Acute on chronic combined CHF: BNP elevated to 571. Possible CHF contributed to recent chest pain. She was diuresed with IV lasix. Weight 202 on admission, down to 197lbs on the day of discharge. She was restarted on BBlocker this admission.  - Continue lasix 40mg  daily - Continue bisoprolol as tolerated - Continue entresto - Continue spironolactone - Continue farxiga - Low sodium diet and daily weights encouraged.   3. HTN: BP on the soft side this admission. Home hydralazine held - Continue bisoprolol, entresto, spironolactone, and lasix  4. HLD: LDL 133 09/2020 subsequently seen by Dr. Debara Pickett in Lipid clinic and recommended to add repatha, though does not appear this has been started yet.  - Continue atorvastatin and repatha  5. Depression/anxiety: increased social stressors recently. Very tearful. Seen by Psych this admission who recommended prn hydroxyzine and outpatient follow-up.  - AVS updated with psych resources - outpatient follow-up encouraged - Will give 1 month supply of hydroxyzine  6. COPD: home medications continued throughout admission - Continue management per PCP outpatient  7. Tobacco abuse: reportedly quit the day prior to admission, though was struggling.  - Continue to encourage cessation  Did the patient have an acute coronary syndrome (MI, NSTEMI, STEMI, etc) this admission?:  No                               Did the patient have a percutaneous coronary intervention (stent / angioplasty)?:  No.       _____________  Discharge Vitals Blood pressure 112/88, pulse 61, temperature 98.3 F (36.8 C), temperature source Oral, resp. rate 20, height 5\' 1"  (1.549 m), weight 89.4 kg, SpO2 100 %.  Filed Weights   03/10/21 1851 03/11/21 0029 03/12/21 0042  Weight: 89.9 kg 90.3 kg 89.4 kg    Labs & Radiologic Studies    CBC Recent Labs    03/10/21 1801 03/11/21 0347  WBC 9.8 8.1  HGB 14.1 13.3  HCT 43.3 40.0  MCV  86.4 86.0  PLT 263 330   Basic Metabolic Panel Recent Labs    03/10/21 1114 03/10/21 1801 03/11/21 0347  NA 140  --  139  K 3.1*  --  3.5  CL 99  --  103  CO2 32  --  29  GLUCOSE 95  --  112*  BUN 15  --  16  CREATININE 1.33* 1.35* 1.23*  CALCIUM 9.6  --  9.2   Liver Function Tests No results for input(s): AST, ALT, ALKPHOS, BILITOT, PROT, ALBUMIN in the last 72 hours. No results for input(s): LIPASE, AMYLASE in the last 72 hours. High Sensitivity Troponin:   Recent Labs  Lab 02/23/21 2008 03/10/21 1114 03/10/21 1314 03/10/21 1801 03/10/21 1938  TROPONINIHS 19* 23* 17 24* 22*    BNP Invalid input(s): POCBNP D-Dimer No results  for input(s): DDIMER in the last 72 hours. Hemoglobin A1C No results for input(s): HGBA1C in the last 72 hours. Fasting Lipid Panel No results for input(s): CHOL, HDL, LDLCALC, TRIG, CHOLHDL, LDLDIRECT in the last 72 hours. Thyroid Function Tests No results for input(s): TSH, T4TOTAL, T3FREE, THYROIDAB in the last 72 hours.  Invalid input(s): FREET3 _____________  DG Chest 2 View  Result Date: 03/10/2021 CLINICAL DATA:  Chest pain radiating to the left. Shortness of breath. EXAM: CHEST - 2 VIEW COMPARISON:  One-view chest x-ray 02/23/2021 FINDINGS: The heart is enlarged. Increasing interstitial pattern is consistent with edema. No effusions are present. No significant airspace consolidation is present. Minimal bibasilar atelectasis noted. Mild degenerative changes are noted in the midthoracic spine. IMPRESSION: Cardiomegaly with increasing interstitial edema compatible with congestive heart failure. Electronically Signed   By: San Morelle M.D.   On: 03/10/2021 12:37   DG Chest Portable 1 View  Result Date: 02/23/2021 CLINICAL DATA:  Shortness of breath and chest pain. Rales right lower lobe. EXAM: PORTABLE CHEST 1 VIEW COMPARISON:  Multiple priors, most recent 11/09/2020. FINDINGS: Enlarged cardiac silhouette. Kerley B lines at the right  lateral lung base. Streaky bibasilar opacities. No consolidation. No visible pleural effusions or pneumothorax on this single AP portable radiograph. The visualized skeletal structures are unremarkable. Polyarticular degenerative change. IMPRESSION: 1. Kerley B lines at the right lateral lung base, suggestive of mild interstitial edema. 2. Streaky bibasilar opacities, favor atelectasis. No consolidation. 3. Cardiomegaly. Electronically Signed   By: Margaretha Sheffield MD   On: 02/23/2021 12:12   ECHOCARDIOGRAM COMPLETE  Result Date: 02/24/2021    ECHOCARDIOGRAM REPORT   Patient Name:   KAYDAN WILHOITE Date of Exam: 02/24/2021 Medical Rec #:  [id number]       Height:       61.0 in Accession #:    [id number]      Weight:       197.7 lb Date of Birth:  [birth year]       BSA:          1.879 m Patient Age:    4 years        BP:           133/93 mmHg Patient Gender: F               HR:           94 bpm. Exam Location:  Inpatient Procedure: 2D Echo Indications:    Acute Respiratory distress R06.03  History:        Patient has prior history of Echocardiogram examinations, most                 recent 06/19/2020. CAD and Previous Myocardial Infarction; Risk                 Factors:Hypertension, Dyslipidemia and Current Smoker.  Sonographer:    Mikki Santee RDCS (AE) Referring Phys: [id number] Orlean Bradford Weston  1. Global hypokinesis worse in the inferior wall. Left ventricular ejection fraction, by estimation, is 30 to 35%. The left ventricle has moderately decreased function. The left ventricle demonstrates global hypokinesis. The left ventricular internal cavity size was moderately dilated. Left ventricular diastolic parameters are consistent with Grade II diastolic dysfunction (pseudonormalization). Elevated left ventricular end-diastolic pressure.  2. Right ventricular systolic function is normal. The right ventricular size is normal. There is normal pulmonary artery systolic pressure.  3. Left atrial size was  moderately dilated.  4. The pericardial effusion is posterior  to the left ventricle.  5. MV coaptation apically displaced from LV dysfunction . The mitral valve is abnormal. Mild to moderate mitral valve regurgitation. No evidence of mitral stenosis.  6. The aortic valve is tricuspid. There is moderate calcification of the aortic valve. Aortic valve regurgitation is not visualized. Mild to moderate aortic valve sclerosis/calcification is present, without any evidence of aortic stenosis.  7. The inferior vena cava is normal in size with greater than 50% respiratory variability, suggesting right atrial pressure of 3 mmHg. FINDINGS  Left Ventricle: Global hypokinesis worse in the inferior wall. Left ventricular ejection fraction, by estimation, is 30 to 35%. The left ventricle has moderately decreased function. The left ventricle demonstrates global hypokinesis. The left ventricular internal cavity size was moderately dilated. There is no left ventricular hypertrophy. Left ventricular diastolic parameters are consistent with Grade II diastolic dysfunction (pseudonormalization). Elevated left ventricular end-diastolic pressure. Right Ventricle: The right ventricular size is normal. No increase in right ventricular wall thickness. Right ventricular systolic function is normal. There is normal pulmonary artery systolic pressure. The tricuspid regurgitant velocity is 2.69 m/s, and  with an assumed right atrial pressure of 3 mmHg, the estimated right ventricular systolic pressure is 46.5 mmHg. Left Atrium: Left atrial size was moderately dilated. Right Atrium: Right atrial size was normal in size. Pericardium: Trivial pericardial effusion is present. The pericardial effusion is posterior to the left ventricle. Mitral Valve: MV coaptation apically displaced from LV dysfunction. The mitral valve is abnormal. There is mild thickening of the mitral valve leaflet(s). Mild to moderate mitral valve regurgitation. No evidence of  mitral valve stenosis. Tricuspid Valve: The tricuspid valve is normal in structure. Tricuspid valve regurgitation is mild . No evidence of tricuspid stenosis. Aortic Valve: The aortic valve is tricuspid. There is moderate calcification of the aortic valve. Aortic valve regurgitation is not visualized. Mild to moderate aortic valve sclerosis/calcification is present, without any evidence of aortic stenosis. Pulmonic Valve: The pulmonic valve was normal in structure. Pulmonic valve regurgitation is not visualized. No evidence of pulmonic stenosis. Aorta: The aortic root is normal in size and structure. Venous: The inferior vena cava is normal in size with greater than 50% respiratory variability, suggesting right atrial pressure of 3 mmHg. IAS/Shunts: No atrial level shunt detected by color flow Doppler.  LEFT VENTRICLE PLAX 2D LVIDd:         6.00 cm      Diastology LVIDs:         4.70 cm      LV e' medial:    5.66 cm/s LV PW:         0.90 cm      LV E/e' medial:  20.5 LV IVS:        1.00 cm      LV e' lateral:   5.26 cm/s LVOT diam:     2.00 cm      LV E/e' lateral: 22.1 LV SV:         62 LV SV Index:   33 LVOT Area:     3.14 cm  LV Volumes (MOD) LV vol d, MOD A2C: 138.0 ml LV vol d, MOD A4C: 121.0 ml LV vol s, MOD A2C: 72.3 ml LV vol s, MOD A4C: 72.1 ml LV SV MOD A2C:     65.7 ml LV SV MOD A4C:     121.0 ml LV SV MOD BP:      58.4 ml RIGHT VENTRICLE RV S prime:     12.80  cm/s TAPSE (M-mode): 2.2 cm LEFT ATRIUM              Index       RIGHT ATRIUM           Index LA diam:        4.60 cm  2.45 cm/m  RA Area:     18.20 cm LA Vol (A2C):   131.0 ml 69.70 ml/m RA Volume:   50.30 ml  26.76 ml/m LA Vol (A4C):   93.4 ml  49.69 ml/m LA Biplane Vol: 117.0 ml 62.25 ml/m  AORTIC VALVE LVOT Vmax:   131.00 cm/s LVOT Vmean:  82.600 cm/s LVOT VTI:    0.198 m  AORTA Ao Root diam: 3.10 cm MITRAL VALVE                 TRICUSPID VALVE MV Area (PHT): 4.57 cm      TR Peak grad:   28.9 mmHg MV Decel Time: 166 msec      TR Vmax:         269.00 cm/s MR Peak grad:    92.2 mmHg MR Mean grad:    53.0 mmHg   SHUNTS MR Vmax:         480.00 cm/s Systemic VTI:  0.20 m MR Vmean:        342.0 cm/s  Systemic Diam: 2.00 cm MR PISA:         2.26 cm MR PISA Eff ROA: 20 mm MR PISA Radius:  0.60 cm MV E velocity: 116.00 cm/s MV A velocity: 47.10 cm/s MV E/A ratio:  2.46 Jenkins Rouge MD Electronically signed by Jenkins Rouge MD Signature Date/Time: 02/24/2021/11:06:17 AM    Final    Disposition   Pt is being discharged home today in good condition.  Follow-up Plans & Appointments     Follow-up Information    [facility] Follow up.   Specialty: Professional Counselor Why: Counseling services and support Contact information: [facility] [place] [place] [address] [phone]        [facility] Follow up.   Specialty: Behavioral Health Why: Intensive outpatient therapy and group programs to help you process and cope with life stressors on a daily basis Contact information: [place]       Gregor Hams, MD Follow up on 03/20/2021.   Specialties: Family Medicine, Emergency Medicine, Sports Medicine Why: Please arrive 15 minutes early for your 10:30am sports medicine appointment to evaluate your shoulder Contact information: [place] [address] [phone]        Bensimhon, Shaune Pascal, MD Follow up.   Specialty: Cardiology Why: The office will contact you directly with an appointment date/time for post-hospital follow-up.  Contact information: [address] [place] [address] [phone]              Discharge Instructions    Diet - low sodium heart healthy   Complete by: As directed    Increase activity slowly   Complete by: As directed       Discharge Medications   Allergies as of 03/12/2021       Reactions   Coreg [carvedilol] Other (See Comments)   Headaches   Strawberry Extract Anaphylaxis, Hives, Swelling, Other (See Comments)   "Fresh strawberries" made the throat swell (2014)   Imdur [isosorbide Nitrate] Other (See Comments)  Headaches   Amoxicillin Nausea And Vomiting   Did it involve swelling of the face/tongue/throat, SOB, or low BP? Yes Did it involve sudden or severe rash/hives, skin peeling, or any reaction on the inside of your mouth or nose? Yes Did you need to seek medical attention at a hospital or doctor's office? Yes When did it last happen?within last 10 years If all above answers are "NO", may proceed with cephalosporin use.   Dilaudid [hydromorphone Hcl] Nausea And Vomiting   Ibuprofen Other (See Comments)   Wheezing   Latex Swelling, Other (See Comments)   No reaction with Band-Aids, though   Metoprolol Other (See Comments)   Headaches and wheezing      Medication List    STOP taking these medications   hydrALAZINE 50 MG tablet Commonly known as: APRESOLINE     TAKE these medications   Acetaminophen Extra Strength 500 MG tablet Generic drug: acetaminophen TAKE 2 TABLETS BY MOUTH EVERY 6 HOURS AS NEEDED   aspirin 81 MG EC tablet Take 1 tablet (81 mg total) by mouth daily.   atorvastatin 80 MG tablet Commonly known as: LIPITOR TAKE 1 TABLET BY MOUTH ONCE A DAY What changed: how much to take   benzonatate 100 MG capsule Commonly known as: TESSALON Take 1 capsule (100 mg total) by mouth 2 (two) times daily. What changed:   when to take this  reasons to take this   bisoprolol 5 MG tablet Commonly known as: ZEBETA Take 1 tablet (5 mg total) by mouth daily. Start taking on: March 13, 2021   cetirizine 10 MG tablet Commonly known as: ZYRTEC TAKE 1 TABLET BY MOUTH ONCE A DAY AS NEEDED What changed:   how much to take  when to take this   clopidogrel 75 MG tablet Commonly known as: PLAVIX TAKE 1 TABLET BY MOUTH ONCE A  DAY What changed: how much to take   dapagliflozin propanediol 10 MG Tabs tablet Commonly known as: FARXIGA TAKE 1 TABLET BY MOUTH DAILY BEFORE BREAKFAST What changed: how much to take   diclofenac Sodium 1 % Gel Commonly known as: VOLTAREN APPLY 4 GRAMS FOUR TIMES DAILY AS NEEDED FOR PAINS AS DIRECTED What changed:   how much to take  how to take this  when to take this  reasons to take this   Entresto 97-103 MG Generic drug: sacubitril-valsartan Take 1 tablet by mouth 2 (two) times daily.   furosemide 40 MG tablet Commonly known as: LASIX TAKE 1 TABLET BY MOUTH DAILY. What changed:   how much to take  when to take this   hydrOXYzine 25 MG tablet Commonly known as: ATARAX/VISTARIL Take 1 tablet (25 mg total) by mouth 3 (three) times daily as needed for anxiety.   nitroGLYCERIN 0.4 MG SL tablet Commonly known as: NITROSTAT DISSOLVE 1 TABLET UNDER THE TONGUE AS NEEDED AS DIRECTED What changed:   how much to take  how to take this  when to take this  reasons to take this   ofloxacin 0.3 % ophthalmic solution Commonly known as: OCUFLOX INSTILL 1 DROP 3 TIMES DAILY IN OPERATIVE EYE STARTING 2 DAYS PRIOR TO SURGERY AND AFTER SURGERY FOR 3 WEEKS. What changed:   how much to take  how to take this  when to take this  additional instructions   potassium chloride 10 MEQ tablet Commonly known as: KLOR-CON TAKE 2 TABLETS BY MOUTH 2 TIMES DAILY What changed: how much to take   prednisoLONE acetate 1 % ophthalmic  suspension Commonly known as: PRED FORTE APPLY ONE DROP IN OPERATIVE EYE(S) 3 TIMES DAILY STARTING 2 DAYS BEFORE SURGERY AND AFTER SURGERY FOR 3 WEEKS What changed:   how much to take  how to take this  when to take this  additional instructions   ProAir HFA 108 (90 Base) MCG/ACT inhaler Generic drug: albuterol INHALE 2 PUFFS EVERY 4 HOURS AS NEEDED What changed: reasons to take this   albuterol (2.5 MG/3ML) 0.083% nebulizer  solution Commonly known as: PROVENTIL INHALE 1 VIAL VIA NEBULIZER EVERY 6 HOURS AS NEEDED What changed:   how much to take  how to take this  when to take this  reasons to take this   Repatha SureClick 017 MG/ML Soaj Generic drug: Evolocumab INJECT 1 DOSE INTO THE SKIN EVERY 14 (FOURTEEN) DAYS.   spironolactone 25 MG tablet Commonly known as: ALDACTONE TAKE 1 TABLET BY MOUTH DAILY What changed: how much to take   Trelegy Ellipta 200-62.5-25 MCG/INH Aepb Generic drug: Fluticasone-Umeclidin-Vilant Inhale 1 puff into the lungs daily as needed (wheezing/sob).   Vitamin D (Ergocalciferol) 1.25 MG (50000 UNIT) Caps capsule Commonly known as: DRISDOL TAKE 1 CAPSULE BY MOUTH ONCE A WEEK What changed: how much to take   zolpidem 10 MG tablet Commonly known as: AMBIEN TAKE 1 TABLET BY MOUTH AT BEDTIME AS NEEDED FOR SLEEP What changed:   how much to take  reasons to take this          Outstanding Labs/Studies   Check BMET at follow-up for close monitoring of electrolytes and kidney function.  Duration of Discharge Encounter   Greater than 30 minutes including physician time.  Signed, Abigail Butts, PA-C 03/12/2021, 12:14 PM

## 2021-03-12 NOTE — Progress Notes (Signed)
Progress Note  Patient Name: Tracey Manning Date of Encounter: 03/12/2021  [facility] Cardiologist: Quay Burow, MD   Subjective   Breathing is OK   Still with shoulder arm and neck pain (Left)  Inpatient Medications    Scheduled Meds: . aspirin EC  81 mg Oral Daily  . atorvastatin  80 mg Oral Daily  . bisoprolol  5 mg Oral Daily  . clopidogrel  75 mg Oral Daily  . dapagliflozin propanediol  10 mg Oral Daily  . feeding supplement  237 mL Oral BID BM  . fluticasone furoate-vilanterol  1 puff Inhalation Daily  . furosemide  40 mg Oral Daily  . gabapentin  300 mg Oral Once  . heparin  5,000 Units Subcutaneous Q8H  . multivitamin with minerals  1 tablet Oral Daily  . sacubitril-valsartan  1 tablet Oral BID  . spironolactone  25 mg Oral Daily   Continuous Infusions:  PRN Meds: acetaminophen, albuterol, albuterol, nitroGLYCERIN, ondansetron (ZOFRAN) IV, zolpidem   Vital Signs    Vitals:   03/11/21 1651 03/11/21 2018 03/12/21 0042 03/12/21 0404  BP: 107/72 100/78  116/82  Pulse: 64 64  66  Resp: 18 18  20   Temp: 99 F (37.2 C) 97.9 F (36.6 C)  97.9 F (36.6 C)  TempSrc: Oral Oral  Oral  SpO2: 100% 96%  100%  Weight:   89.4 kg   Height:        Intake/Output Summary (Last 24 hours) at 03/12/2021 0733 Last data filed at 03/12/2021 0300 Gross per 24 hour  Intake 942 ml  Output 800 ml  Net 142 ml   Last 3 Weights 03/12/2021 03/11/2021 03/10/2021  Weight (lbs) 197 lb 1.6 oz 199 lb 1.6 oz 198 lb 3.1 oz  Weight (kg) 89.404 kg 90.311 kg 89.9 kg      Telemetry    SR - Personally Reviewed  ECG    No new  Personally Reviewed  Physical Exam   GEN: No acute distress.   Neck: No JVD Cardiac: RRR, no murmurs,\ L shoulder:   Painful with movement  Respiratory:  Moving air OK     GI: Soft, nontender, non-distended  MS: No edema; No deformity.  Painful on palpation L upper chest and shoulder  Neuro:  Nonfocal  Psych: Normal affect   Labs    High  Sensitivity Troponin:   Recent Labs  Lab 02/23/21 2008 03/10/21 1114 03/10/21 1314 03/10/21 1801 03/10/21 1938  TROPONINIHS 19* 23* 17 24* 22*      Chemistry Recent Labs  Lab 03/10/21 1114 03/10/21 1801 03/11/21 0347  NA 140  --  139  K 3.1*  --  3.5  CL 99  --  103  CO2 32  --  29  GLUCOSE 95  --  112*  BUN 15  --  16  CREATININE 1.33* 1.35* 1.23*  CALCIUM 9.6  --  9.2  GFRNONAA 46* 45* 51*  ANIONGAP 9  --  7     Hematology Recent Labs  Lab 03/10/21 1114 03/10/21 1801 03/11/21 0347  WBC 11.2* 9.8 8.1  RBC 4.83 5.01 4.65  HGB 13.8 14.1 13.3  HCT 42.6 43.3 40.0  MCV 88.2 86.4 86.0  MCH 28.6 28.1 28.6  MCHC 32.4 32.6 33.3  RDW 17.0* 16.9* 16.8*  PLT 239 263 229    BNP Recent Labs  Lab 03/10/21 1154  BNP 571.0*     DDimer No results for input(s): DDIMER in the last 168 hours.  Radiology    DG Chest 2 View  Result Date: 03/10/2021 CLINICAL DATA:  Chest pain radiating to the left. Shortness of breath. EXAM: CHEST - 2 VIEW COMPARISON:  One-view chest x-ray 02/23/2021 FINDINGS: The heart is enlarged. Increasing interstitial pattern is consistent with edema. No effusions are present. No significant airspace consolidation is present. Minimal bibasilar atelectasis noted. Mild degenerative changes are noted in the midthoracic spine. IMPRESSION: Cardiomegaly with increasing interstitial edema compatible with congestive heart failure. Electronically Signed   By: San Morelle M.D.   On: 03/10/2021 12:37    Cardiac Studies   None   Patient Profile     60 y.o. female hx of CAD andICM   Admitted yesterday with CP  Admitted for eval and optimization of medical Rx .    Assessment & Plan     1  CP   Atypical for cardiac   Trop negative   I think more radiating from musculoskeletal pain  2  ICM  Pt notes some SOB  Will dose with IV lasix once and follow  Reassess in PM today     OTher meds have been restarted   Tolerating   3  CAD  As above   I am not  convinced of active angina   4  HL  COntinue statin  5  Psych   Pt with multipple stressors in [place], personal situation   Tearful   Will see about getting her st up with counselling   Appears reactive     6.   L shoulder   Made appt with DR Daralene [facility]   For Thurs 4/28 at 10:30   7  Psych   Appreciate input   Hopefully can get support as outpt   d/c today  For questions or updates, please contact [place] Please consult [email or web address] for contact info under        Signed, Dorris Carnes, MD  03/12/2021, 7:33 AM

## 2021-03-12 NOTE — TOC Transition Note (Signed)
Transition of Care [facility]) - CM/SW Discharge Note   Patient Details  Name: Tracey Manning MRN: [id number] Date of Birth: January 09, 1961  [facility] CM/SW Contact:  Zenon Mayo, RN Phone Number: 03/12/2021, 1:27 PM   Clinical Narrative:    Patient is for dc today to home, NCM provided her with outpatient psych resources and mental health resources.  She states she will need transport home. She has signed the waiver for [facility].     Final next level of care: Home/Self Care Barriers to Discharge: No Barriers Identified   Patient Goals and CMS Choice Patient states their goals for this hospitalization and ongoing recovery are:: return home   Choice offered to / list presented to : NA  Discharge Placement                       Discharge Plan and Services                  DME Agency: NA       HH Arranged: NA          Social Determinants of Health (SDOH) Interventions     Readmission Risk Interventions No flowsheet data found.

## 2021-03-12 NOTE — Plan of Care (Signed)
  Problem: Education: Goal: Knowledge of General Education information will improve Description: Including pain rating scale, medication(s)/side effects and non-pharmacologic comfort measures 03/12/2021 1212 by Thornton Papas, RN Outcome: Completed/Met 03/12/2021 1211 by Thornton Papas, RN Outcome: Progressing   Problem: Health Behavior/Discharge Planning: Goal: Ability to manage health-related needs will improve 03/12/2021 1212 by Thornton Papas, RN Outcome: Completed/Met 03/12/2021 1211 by Thornton Papas, RN Outcome: Progressing   Problem: Clinical Measurements: Goal: Ability to maintain clinical measurements within normal limits will improve 03/12/2021 1212 by Thornton Papas, RN Outcome: Completed/Met 03/12/2021 1211 by Thornton Papas, RN Outcome: Progressing Goal: Will remain free from infection 03/12/2021 1212 by Thornton Papas, RN Outcome: Completed/Met 03/12/2021 1211 by Thornton Papas, RN Outcome: Progressing Goal: Diagnostic test results will improve 03/12/2021 1212 by Thornton Papas, RN Outcome: Completed/Met 03/12/2021 1211 by Thornton Papas, RN Outcome: Progressing Goal: Respiratory complications will improve 03/12/2021 1212 by Thornton Papas, RN Outcome: Completed/Met 03/12/2021 1211 by Thornton Papas, RN Outcome: Progressing Goal: Cardiovascular complication will be avoided 03/12/2021 1212 by Thornton Papas, RN Outcome: Completed/Met 03/12/2021 1211 by Thornton Papas, RN Outcome: Progressing   Problem: Activity: Goal: Risk for activity intolerance will decrease 03/12/2021 1212 by Thornton Papas, RN Outcome: Completed/Met 03/12/2021 1211 by Thornton Papas, RN Outcome: Progressing   Problem: Nutrition: Goal: Adequate nutrition will be maintained 03/12/2021 1212 by Thornton Papas, RN Outcome: Completed/Met 03/12/2021 1211 by Thornton Papas, RN Outcome: Progressing   Problem: Coping: Goal: Level of anxiety will decrease 03/12/2021 1212 by Thornton Papas, RN Outcome: Completed/Met 03/12/2021 1211 by Thornton Papas, RN Outcome: Progressing   Problem: Elimination: Goal: Will not experience complications related to bowel motility 03/12/2021 1212 by Thornton Papas, RN Outcome: Completed/Met 03/12/2021 1211 by Thornton Papas, RN Outcome: Progressing Goal: Will not experience complications related to urinary retention 03/12/2021 1212 by Thornton Papas, RN Outcome: Completed/Met 03/12/2021 1211 by Thornton Papas, RN Outcome: Progressing   Problem: Pain Managment: Goal: General experience of comfort will improve 03/12/2021 1212 by Thornton Papas, RN Outcome: Completed/Met 03/12/2021 1211 by Thornton Papas, RN Outcome: Progressing   Problem: Safety: Goal: Ability to remain free from injury will improve 03/12/2021 1212 by Thornton Papas, RN Outcome: Completed/Met 03/12/2021 1211 by Thornton Papas, RN Outcome: Progressing   Problem: Skin Integrity: Goal: Risk for impaired skin integrity will decrease 03/12/2021 1212 by Thornton Papas, RN Outcome: Completed/Met 03/12/2021 1211 by Thornton Papas, RN Outcome: Progressing   Problem: Education: Goal: Ability to demonstrate management of disease process will improve 03/12/2021 1212 by Thornton Papas, RN Outcome: Completed/Met 03/12/2021 1211 by Thornton Papas, RN Outcome: Progressing Goal: Ability to verbalize understanding of medication therapies will improve 03/12/2021 1212 by Thornton Papas, RN Outcome: Completed/Met 03/12/2021 1211 by Thornton Papas, RN Outcome: Progressing Goal: Individualized Educational Video(s) 03/12/2021 1212 by Thornton Papas, RN Outcome: Completed/Met 03/12/2021 1211 by Thornton Papas, RN Outcome: Progressing   Problem: Activity: Goal: Capacity to carry out activities will improve 03/12/2021 1212 by Thornton Papas, RN Outcome: Completed/Met 03/12/2021 1211 by Thornton Papas, RN Outcome: Progressing   Problem: Cardiac: Goal: Ability to  achieve and maintain adequate cardiopulmonary perfusion will improve 03/12/2021 1212 by Thornton Papas, RN Outcome: Completed/Met 03/12/2021 1211 by Thornton Papas, RN Outcome: Progressing

## 2021-03-12 NOTE — Progress Notes (Signed)
   03/12/21 1055  Mobility  Activity Ambulated in room;Ambulated to bathroom;Dangled on edge of bed  Range of Motion/Exercises Active;All extremities  Level of Assistance Standby assist, set-up cues, supervision of patient - no hands on  Assistive Device None  Minutes Stood 2 minutes  Minutes Ambulated 2 minutes  Distance Ambulated (ft) 35 ft  Mobility Response Tolerated well  Mobility performed by Mobility specialist  Bed Position Semi-fowlers  $Mobility charge 1 Mobility

## 2021-03-12 NOTE — Plan of Care (Signed)

## 2021-03-12 NOTE — Discharge Instructions (Signed)

## 2021-03-13 ENCOUNTER — Other Ambulatory Visit (HOSPITAL_COMMUNITY): Payer: Self-pay

## 2021-03-13 ENCOUNTER — Telehealth (HOSPITAL_COMMUNITY): Payer: Self-pay | Admitting: Pharmacy Technician

## 2021-03-13 NOTE — Telephone Encounter (Signed)
Advanced Heart Failure Patient Advocate Encounter  Prior Authorization for [name] has been approved.    PA# [id number] Effective dates: 03/05/21 through 03/05/22  Charlann Boxer, CPhT

## 2021-03-14 ENCOUNTER — Other Ambulatory Visit (HOSPITAL_COMMUNITY): Payer: Self-pay

## 2021-03-14 ENCOUNTER — Telehealth (HOSPITAL_COMMUNITY): Payer: Self-pay

## 2021-03-14 ENCOUNTER — Telehealth (HOSPITAL_COMMUNITY): Payer: Self-pay | Admitting: Licensed Clinical Social Worker

## 2021-03-14 MED FILL — Evolocumab Subcutaneous Soln Auto-Injector 140 MG/ML: SUBCUTANEOUS | 28 days supply | Qty: 2 | Fill #0 | Status: CN

## 2021-03-14 MED FILL — Zolpidem Tartrate Tab 10 MG: ORAL | 30 days supply | Qty: 15 | Fill #1 | Status: CN

## 2021-03-14 NOTE — Progress Notes (Signed)
Paramedicine Encounter    Patient ID: Joani Cosma, female    DOB: [birth year], 60 y.o.   MRN: [id number]  Arrived for home visit for Camara who was alert and oriented reporting she is feeling okay today. Archana agreed to paramedicine visits and we will have a full home visit on Wednesday next week however today was for a medication verification visit. I reviewed each medications with Madilyne and she was able to tell me what each medicine is for but has been taking all medicines except the Entresto, Ambien and Hydroxizine at the same time every day. I explained the importance of seperating medications and taking them as prescribed. I verified all medications and filled a pill box for Jonda accordingly.   The following medications are missing:  - Zebeta -Repatha  These medications will be shipped to her via [place] and be delivered on Tuesday. I will see patient in the home on Wednesday to start these two medications with her next weeks pill box. Home visit complete.      ACTION: Home visit completed Next visit planned for Wednesday

## 2021-03-14 NOTE — Telephone Encounter (Signed)
Spoke to Tracey Manning who agreed to home visit on Wednesday. Call complete.

## 2021-03-14 NOTE — Telephone Encounter (Signed)
CSW received call from to inquire about getting assistance with her gas bill.  States she owes $74 before the end of the month or else her payment plan will be stopped and she will have to pay a lot more each month- which she can't afford.  States that starting next month the $74 should not be an issues.  CSW able to assist with outstanding bill of $148 through the [facility].  Pt then stated she is out of groceries and the friend who normally takes her car broke down.  CSW able to assist with ride to the grocery store using [facility]- pt does not anticipate this being an issue in the future.  CSW will continue to follow and assist as needed  Jorge Ny, [facility] Desk#: [phone] Cell#: [phone]

## 2021-03-17 ENCOUNTER — Encounter (HOSPITAL_COMMUNITY): Payer: Medicaid Other

## 2021-03-18 ENCOUNTER — Telehealth (HOSPITAL_COMMUNITY): Payer: Self-pay

## 2021-03-18 ENCOUNTER — Other Ambulatory Visit (HOSPITAL_COMMUNITY): Payer: Self-pay

## 2021-03-18 NOTE — Telephone Encounter (Signed)
Spoke to [place] who reports Bisoprolol is needing a PA and insurance is rejecting it at this time. Repatha PA was rejected. I communicated this to the patient and forwarding to clinic for further assistance as patient has not yet started either medication due to lack of receiving it.

## 2021-03-19 ENCOUNTER — Other Ambulatory Visit (HOSPITAL_COMMUNITY): Payer: Self-pay

## 2021-03-19 ENCOUNTER — Other Ambulatory Visit: Payer: Self-pay | Admitting: Internal Medicine

## 2021-03-19 ENCOUNTER — Telehealth (HOSPITAL_COMMUNITY): Payer: Self-pay | Admitting: Pharmacy Technician

## 2021-03-19 MED FILL — Ergocalciferol Cap 1.25 MG (50000 Unit): ORAL | 28 days supply | Qty: 4 | Fill #0 | Status: AC

## 2021-03-19 MED FILL — Zolpidem Tartrate Tab 10 MG: ORAL | 30 days supply | Qty: 15 | Fill #1 | Status: CN

## 2021-03-19 NOTE — Telephone Encounter (Signed)
Forwarded to [employer] for bisoprolol PA . Lipid clinic is responsible for repatha PA.

## 2021-03-19 NOTE — Progress Notes (Signed)
Paramedicine Encounter    Patient ID: Tracey Manning, female    DOB: 1961-08-11, 60 y.o.   MRN: [id number]   Patient Care Team: Nolene Ebbs, MD as PCP - General (Internal Medicine) [name], MD as PCP - Cardiology (Cardiology) Bensimhon, Shaune Pascal, MD as PCP - Advanced Heart Failure (Cardiology) Jorge Ny, LCSW as Social Worker (Licensed Clinical Social Worker)  Patient Active Problem List   Diagnosis Date Noted  . Acute on chronic combined systolic (congestive) and diastolic (congestive) heart failure ([place]) 03/10/2021  . Acute respiratory failure with hypoxia ([place]) 02/23/2021  . Impingement syndrome of left shoulder 03/12/2020  . Chronic pain 07/18/2019  . CRI (chronic renal insufficiency), stage 3 (moderate) 07/18/2019  . COPD (chronic obstructive pulmonary disease) ([place]) 06/28/2019  . Obesity (BMI 30-39.9) 06/28/2019  . Ischemic cardiomyopathy 06/28/2019  . Dyspnea on exertion 06/15/2019  . Acute systolic CHF (congestive heart failure) ([place])   . AKI (acute kidney injury) ([place])   . Abnormal uterine bleeding (AUB) 04/03/2016  . Dysfunctional uterine bleeding 11/14/2015  . Bacterial vaginosis (recurrent) 11/14/2015  . Atypical chest pain 11/14/2015  . Essential hypertension 04/04/2014  . Dyslipidemia, goal LDL below 70 04/04/2014  . CAD S/P multiple PCIs 04/04/2014    Current Outpatient Medications:  .  acetaminophen (TYLENOL) 500 MG tablet, TAKE 2 TABLETS BY MOUTH EVERY 6 HOURS AS NEEDED, Disp: 100 tablet, Rfl: 0 .  albuterol (PROVENTIL) (2.5 MG/3ML) 0.083% nebulizer solution, INHALE 1 VIAL VIA NEBULIZER EVERY 6 HOURS AS NEEDED (Patient taking differently: Take 2.5 mg by nebulization every 6 (six) hours as needed for wheezing or shortness of breath.), Disp: 90 mL, Rfl: 11 .  albuterol (VENTOLIN HFA) 108 (90 Base) MCG/ACT inhaler, INHALE 2 PUFFS EVERY 4 HOURS AS NEEDED (Patient taking differently: Inhale 2 puffs into the lungs every 4 (four) hours as needed for  shortness of breath or wheezing.), Disp: 8.5 g, Rfl: 5 .  aspirin 81 MG EC tablet, Take 1 tablet (81 mg total) by mouth daily., Disp: 90 tablet, Rfl: 3 .  atorvastatin (LIPITOR) 80 MG tablet, TAKE 1 TABLET BY MOUTH ONCE A DAY, Disp: 90 tablet, Rfl: 3 .  benzonatate (TESSALON) 100 MG capsule, Take 1 capsule (100 mg total) by mouth 2 (two) times daily. (Patient taking differently: Take 100 mg by mouth 2 (two) times daily as needed for cough.), Disp: 20 capsule, Rfl: 0 .  bisoprolol (ZEBETA) 5 MG tablet, Take 1 tablet (5 mg total) by mouth daily., Disp: 30 tablet, Rfl: 3 .  cetirizine (ZYRTEC) 10 MG tablet, TAKE 1 TABLET BY MOUTH ONCE A DAY AS NEEDED (Patient taking differently: Take 10 mg by mouth in the morning.), Disp: 90 tablet, Rfl: 2 .  clopidogrel (PLAVIX) 75 MG tablet, TAKE 1 TABLET BY MOUTH ONCE A DAY, Disp: 90 tablet, Rfl: 3 .  dapagliflozin propanediol (FARXIGA) 10 MG TABS tablet, TAKE 1 TABLET BY MOUTH DAILY BEFORE BREAKFAST, Disp: 90 tablet, Rfl: 3 .  diclofenac Sodium (VOLTAREN) 1 % GEL, APPLY 4 GRAMS FOUR TIMES DAILY AS NEEDED FOR PAINS AS DIRECTED (Patient taking differently: Apply 4 g topically 4 (four) times daily as needed (as directed- for pain).), Disp: 500 g, Rfl: 5 .  Evolocumab 140 MG/ML SOAJ, INJECT 1 DOSE INTO THE SKIN EVERY 14 (FOURTEEN) DAYS., Disp: 2 mL, Rfl: 11 .  Fluticasone-Umeclidin-Vilant (TRELEGY ELLIPTA) 200-62.5-25 MCG/INH AEPB, Inhale 1 puff into the lungs daily as needed (wheezing/sob)., Disp: , Rfl:  .  furosemide (LASIX) 40 MG tablet, TAKE  1 TABLET BY MOUTH DAILY., Disp: 90 tablet, Rfl: 3 .  hydrOXYzine (ATARAX/VISTARIL) 25 MG tablet, Take 1 tablet (25 mg total) by mouth 3 (three) times daily as needed for anxiety., Disp: 30 tablet, Rfl: 0 .  nitroGLYCERIN (NITROSTAT) 0.4 MG SL tablet, DISSOLVE 1 TABLET UNDER THE TONGUE AS NEEDED AS DIRECTED, Disp: 25 tablet, Rfl: 5 .  ofloxacin (OCUFLOX) 0.3 % ophthalmic solution, INSTILL 1 DROP 3 TIMES DAILY IN OPERATIVE EYE  STARTING 2 DAYS PRIOR TO SURGERY AND AFTER SURGERY FOR 3 WEEKS. (Patient taking differently: Place 1 drop into the left eye See admin instructions. Instill 1 drop three times daily in operative eye starting 2 days prior to surgery and after surgery for 3 weeks), Disp: 5 mL, Rfl: 1 .  potassium chloride (KLOR-CON) 10 MEQ tablet, Take 2 tablets (20 mEq total) by mouth 2 (two) times daily., Disp: 120 tablet, Rfl: 11 .  prednisoLONE acetate (PRED FORTE) 1 % ophthalmic suspension, APPLY ONE DROP IN OPERATIVE EYE(S) 3 TIMES DAILY STARTING 2 DAYS BEFORE SURGERY AND AFTER SURGERY FOR 3 WEEKS (Patient taking differently: Place 1 drop into the left eye See admin instructions. Apply one drop in operative eye three times daily starting 2 days before surgery and after surgery for 3 weeks), Disp: 5 mL, Rfl: 1 .  sacubitril-valsartan (ENTRESTO) 97-103 MG, Take 1 tablet by mouth 2 (two) times daily., Disp: 180 tablet, Rfl: 3 .  spironolactone (ALDACTONE) 25 MG tablet, TAKE 1 TABLET BY MOUTH DAILY, Disp: 30 tablet, Rfl: 3 .  Vitamin D, Ergocalciferol, (DRISDOL) 1.25 MG (50000 UNIT) CAPS capsule, TAKE 1 CAPSULE BY MOUTH ONCE A WEEK (Patient taking differently: Take 50,000 Units by mouth once a week.), Disp: 4 capsule, Rfl: 5 .  zolpidem (AMBIEN) 10 MG tablet, TAKE 1 TABLET BY MOUTH AT BEDTIME AS NEEDED FOR SLEEP (Patient taking differently: Take 10 mg by mouth at bedtime as needed for sleep.), Disp: 30 tablet, Rfl: 0 Allergies  Allergen Reactions  . Coreg [Carvedilol] Other (See Comments)    Headaches  . Strawberry Extract Anaphylaxis, Hives, Swelling and Other (See Comments)    "Fresh strawberries" made the throat swell (2014)  . Imdur [Isosorbide Nitrate] Other (See Comments)    Headaches   . Amoxicillin Nausea And Vomiting    Did it involve swelling of the face/tongue/throat, SOB, or low BP? Yes Did it involve sudden or severe rash/hives, skin peeling, or any reaction on the inside of your mouth or nose? Yes Did  you need to seek medical attention at a hospital or doctor's office? Yes When did it last happen?within last 10 years If all above answers are "NO", may proceed with cephalosporin use.   . Dilaudid [Hydromorphone Hcl] Nausea And Vomiting  . Ibuprofen Other (See Comments)    Wheezing   . Latex Swelling and Other (See Comments)    No reaction with Band-Aids, though  . Metoprolol Other (See Comments)    Headaches and wheezing     Social History   Socioeconomic History  . Marital status: Single    Spouse name: Not on file  . Number of children: 2  . Years of education: 97  . Highest education level: High school graduate  Occupational History  . Not on file  Tobacco Use  . Smoking status: Current Every Day Smoker    Packs/day: 0.50    Years: 34.00    Pack years: 17.00    Types: Cigarettes  . Smokeless tobacco: Never Used  Vaping Use  .  Vaping Use: Never used  Substance and Sexual Activity  . Alcohol use: No    Alcohol/week: 0.0 standard drinks    Comment: seldom  . Drug use: Yes    Types: Marijuana    Comment: 06/19/19  . Sexual activity: Yes    Partners: Male    Birth control/protection: Post-menopausal  Other Topics Concern  . Not on file  Social History Narrative  . Not on file   Social Determinants of Health   Financial Resource Strain: Medium Risk  . Difficulty of Paying Living Expenses: Somewhat hard  Food Insecurity: Food Insecurity Present  . Worried About Charity fundraiser in the Last Year: Sometimes true  . Ran Out of Food in the Last Year: Sometimes true  Transportation Needs: Unmet Transportation Needs  . Lack of Transportation (Medical): No  . Lack of Transportation (Non-Medical): Yes  Physical Activity: Not on file  Stress: Not on file  Social Connections: Not on file  Intimate Partner Violence: Not on file    Physical Exam Vitals reviewed.  Constitutional:      Appearance: Normal appearance. She is normal weight.  HENT:     Head:  Normocephalic.     Nose: Nose normal.     Mouth/Throat:     Mouth: Mucous membranes are moist.     Pharynx: Oropharynx is clear.  Eyes:     Conjunctiva/sclera: Conjunctivae normal.     Pupils: Pupils are equal, round, and reactive to light.  Cardiovascular:     Rate and Rhythm: Normal rate and regular rhythm.     Pulses: Normal pulses.     Heart sounds: Normal heart sounds.  Pulmonary:     Effort: Pulmonary effort is normal.     Breath sounds: Normal breath sounds.  Abdominal:     General: Abdomen is flat.     Palpations: Abdomen is soft.  Musculoskeletal:        General: Normal range of motion.     Cervical back: Normal range of motion.     Right lower leg: No edema.     Left lower leg: No edema.  Skin:    General: Skin is warm and dry.     Capillary Refill: Capillary refill takes less than 2 seconds.  Neurological:     General: No focal deficit present.     Mental Status: She is alert. Mental status is at baseline.  Psychiatric:        Mood and Affect: Mood normal.      Arrived for home visit for Tracey Manning who was alert and oriented reporting to be feeling good today.   Tracey Manning reports that she has not been sleeping well, as last week we were short on her Ambien pills as Medicaid only approves 15 pills per month and she is reporting to be needing them nightly as she is not sleeping well. Last week I cut the 10mg  in half to spread out the dose for the week, however she reports it isn't working. I spoke to Tracey Manning and they report the doctor will need to approve the increase and change the RX to daily use rather than as needed. I will reach out to PCP for same.   I obtained vitals and they are as noted in report. No reports of shortness of breath, chest pain, dizziness. I reviewed medications and filled pill box accordingly.   Tracey Manning and I reviewed appointments and calendar was filled out accordingly.   I will be back out in  one week for home visit.    Refills: Hydroxyzine Vit-D Ambien    Future Appointments  Date Time Provider [place]  03/20/2021 10:30 AM Gregor Hams, MD [facility] None  04/08/2021 11:30 AM [facility] PA/NP [facility] None     ACTION: Home visit completed Next visit planned for one week

## 2021-03-19 NOTE — Progress Notes (Signed)
Subjective:    CC: Left shoulder pain  I, Christoper FabianMolly Weber, LAT, ATC, am serving as scribe for Dr. Clementeen GrahamEvan Chao Blazejewski.  HPI: Pt is a 60 y/o female presenting w/ L shoulder pain x a year. Pt reports her L shoulder feels like a pulled muscle. She locates her pain to L-side of neck that radiates to 5th finger. Pt has some pain today along the anterior aspect of L shoulder. Pt is R-hand dominate. Pt is retired from work.  She denies any injury.  Radiating pain: yes UE numbness/tingling: yes Aggravating factors: cold, any shoulder movt Treatments tried: heat, ice, diclofenac gel, Tylenol  Additionally she notes bilateral lower leg pain.  Pain occurs into the thighs and to the lateral calves.  Pain is worse with standing and walking and improves with sitting and rest.  She does have a history of lumbar radiculopathy managed with Dr. Franky Machoabbell at neurosurgery.  She had reported treatments with injections in her lumbar spine which she thinks helped but its been a while.  Pertinent review of Systems: No fevers or chills.  Relevant historical information: Coronary artery disease with stents. History of lumbar radiculopathy. Heart disease. Smoker   Objective:    Vitals:   03/20/21 0957  BP: 130/86  Pulse: 87  SpO2: 97%   General: Well Developed, well nourished, and in no acute distress.   MSK:  C-spine normal-appearing Nontender midline. Decreased cervical motion. Mildly positive Spurling's test bilaterally. Upper extremity strength diminished shoulder abduction otherwise intact. Reflexes intact. Sensation is intact distally.  Left shoulder normal appearing Nontender. Range of motion abduction limited 120 degrees. Internal rotation lumbar spine external rotation full. Positive Hawkins and Neer's test. Negative Yergason's and speeds test. Strength 4/5 abduction 5/5 external and internal rotation  Right shoulder normal-appearing Nontender. Range of motion limited 120 degrees by  abduction. Internal rotation lumbar spine.  External rotation full. Positive Hawkins and Neer's test. Negative Yergason's and speeds test. Strength 4/5 abduction.  5/5 external/internal internal rotation.  L-spine normal-appearing Nontender midline. Decreased lumbar motion pain with extension. Positive slump test bilaterally. Lower extremity strength reflexes and sensation are intact distally. Decreased pulses dorsal pedis and posterior tibialis left diminished slightly right.   Lab and Radiology Results  X-ray images C-spine L-spine and bilateral shoulders obtained today personally and independently interpreted.  C-spine: Significant DDD at C5-6.  Moderate DDD elsewhere.  Loss of cervical lordosis.  No acute fractures.  Right shoulder: Mild AC DJD.  No acute fractures.  Left shoulder: Mild AC DJD.  No acute fractures.  Abnormality at superior insertion of supraspinatus tendon on humeral head visible.  L-spine: Anterior listhesis L4-L5.  DDD L5-S1.  No acute fractures visible.  Await formal radiology review  CLINICAL DATA:  Cervical radiculitis. Spinal stenosis of lumbar region.  FLUOROSCOPY TIME:  dictate in minutes and seconds  PROCEDURE: LUMBAR PUNCTURE FOR CERVICAL AND LUMBAR MYELOGRAM  CERVICAL AND LUMBAR MYELOGRAM  Lumbar puncture and intrathecal contrast administration were performed by Dr. Franky Machoabbell who will separately report for the portion of the procedure. I personally supervised acquisition of the myelogram images.  I personally performed the lumbar puncture and administered the intrathecal contrast. I also personally supervised acquisition of the myelogram images.  COMPARISON: Cervical MRI 12/01/2017. Lumbar spine MRI 03/05/2017  FINDINGS: Myelographic findings:  Significantly limited by patient level of discomfort. No cord or foraminal impingement is seen on cervical MRI. On lumbar MRI there is extradural ventral defect at L2-3 and  circumferential defect at L4-5. Advanced lumbar  facet arthropathy with L4-5 grade 1 anterolisthesis.  Cervical myelogram:  Alignment: Reversal of cervical lordosis without listhesis.  Vertebrae and skull base: No evidence of fracture deformity, bone lesion, or endplate erosion.  Cord: Normal morphology.  No unexpected intrathecal filling defect.  Paravertebral soft tissues. Negative. No evidence of inflammation or mass.  Disc levels:  C2-3: Moderate degenerative facet spurring on the left. No herniation. Patent canal and foramina.  C3-4: Mild facet spurring greater on the right. Disc narrowing with small posterior disc osteophyte complex which contacts the ventral cord. No cord compression. Patent foramina  C4-5: Spondylosis and disc narrowing. Mild left more than right facet spurring. No cord compression or foraminal impingement  C5-6: Greatest level of degenerative disc narrowing with endplate sclerosis and spurring. Negative facets. Mild-to-moderate right foraminal narrowing. Patent spinal canal.  C6-7: Disc narrowing and mild left uncovertebral ridging. Patent canal and foramina.  C7-T1:Mild facet spurring.  No impingement  Lumbar spine myelogram:  Segmentation: 5 lumbar type vertebral bodies  Alignment: Facet mediated L4-5 grade 1 anterolisthesis.  Vertebrae: No evidence of fracture, discitis, or aggressive bone lesion.  Conus: Tip is at T12-L1. The cauda equina appear tortuous on the sagittal reformats but has a normal layering appearance on axial slices. Much of this tortuosity is attributed to a tortuous venous structure that is persistently seen. There is no historic myelopathy and the conus is non thickened and was non edematous on preceding MRI.  Paraspinal: No acute finding. Lesion exophytic from the right kidney measures cystic density and was also seen on 2016 CT. Atherosclerotic calcification. Descending colon  diverticulosis.  Disc levels:  T12- L1: Unremarkable.  L1-L2: Facet spurring.  No herniation or impingement  L2-L3: Degenerative facet spurring and mild disc bulging. Mild narrowing of the thecal sac.  L3-L4: Facet arthropathy with severe degeneration. There is facet sclerosis, vacuum phenomenon, and hypertrophy. The disc is mildly narrowed and bulging. No compressive stenosis.  L4-L5: Severe facet arthropathy with vacuum phenomenon fills the gaping joints. There is bulky hypertrophy and prominent bilateral sclerosis. Ventral thecal sac flattening. Thecal sac stenosis is mild-to-moderate. Moderate bilateral foraminal narrowing.  L5-S1:Severe facet arthropathy. Advanced disc degeneration with vacuum phenomenon, disc narrowing, and bulging with buttressing spurs. Endplate spurring is focally noted left paracentral, contact the descending left S1 nerve root. Biforaminal L5 impingement that was also seen previously.  IMPRESSION: Cervical myelogram:  1. Generalized degenerative disc narrowing and mild spurring. Multilevel degenerative facet spurring greatest on the left at C2-3. 2. No cord compression throughout the cervical spine. 3. C5-6 mild to moderate right foraminal narrowing.  Lumbar myelogram:  1. Diffuse facet arthropathy with severe degeneration from L3-4 to L5-S1. Grade 1 anterolisthesis at L4-5. 2. L5-S1 advanced disc degeneration with biforaminal L5 impingement. 3. L2-3 mild to moderate spinal stenosis on myelography that is not seen by CT and attributed to disc bulging when standing. 4. L4-5 moderate spinal stenosis that worsens with standing due to accentuated listhesis and disc bulging.   Electronically Signed   By: Monte Fantasia M.D.   On: 01/26/2018 10:36  I, [name], personally (independently) visualized and performed the interpretation of the images attached in this note.    Impression and Recommendations:    Assessment and  Plan: 60 y.o. female with multifactorial pain  Patient has bilateral shoulder pain and arm pain.  Shoulder pain is probably mostly due to rotator cuff tendinopathy.  However she has a component of cervical radiculopathy and some cervical paraspinal muscle dysfunction.  Plan to treat  with prednisone, Lyrica, and referral to physical therapy.  Some of these issues were seen previously with cervical spine CT myelogram in 2019.  Plan to recheck in about a month.  This likely will require lots of teasing out over the next several months.  Additionally patient has bilateral leg pain.  Again I think a lot of this is lumbar radiculopathy.  Some of this is known from CT myelogram in 2019 of lumbar spine showing L5 radiculopathy.  However I am very concerned for claudication.  She has known coronary artery disease and is a smoker and has diminished pulses per my exam today.  Plan for ABI to further evaluate for the possibility of arterial claudication.  Additionally to treat lumbar radiculopathy treat with prednisone and Lyrica and physical therapy and reassess in a month.  Patient may benefit from epidural steroid injections for both issues and may benefit from subacromial injections in clinic and may need further imaging including MRI.  Recheck 1 month.  Extensive work-up and chart review required today.Marland Kitchen  PDMP not reviewed this encounter. Orders Placed This Encounter  Procedures  . DG Shoulder Left    Standing Status:   Future    Number of Occurrences:   1    Standing Expiration Date:   03/20/2022    Order Specific Question:   Reason for Exam (SYMPTOM  OR DIAGNOSIS REQUIRED)    Answer:   left shoulder pain    Order Specific Question:   Preferred imaging location?    Answer:   [facility]    Order Specific Question:   Is patient pregnant?    Answer:   No  . DG Shoulder Right    Standing Status:   Future    Number of Occurrences:   1    Standing Expiration Date:   03/20/2022    Order  Specific Question:   Reason for Exam (SYMPTOM  OR DIAGNOSIS REQUIRED)    Answer:   right shoulder pain    Order Specific Question:   Preferred imaging location?    Answer:   [facility]    Order Specific Question:   Is patient pregnant?    Answer:   No  . DG Lumbar Spine 2-3 Views    Standing Status:   Future    Number of Occurrences:   1    Standing Expiration Date:   03/20/2022    Order Specific Question:   Reason for Exam (SYMPTOM  OR DIAGNOSIS REQUIRED)    Answer:   low back pain    Order Specific Question:   Preferred imaging location?    Answer:   [facility]    Order Specific Question:   Is patient pregnant?    Answer:   No  . DG Cervical Spine 2 or 3 views    Standing Status:   Future    Number of Occurrences:   1    Standing Expiration Date:   03/20/2022    Order Specific Question:   Reason for Exam (SYMPTOM  OR DIAGNOSIS REQUIRED)    Answer:   neck pain    Order Specific Question:   Is patient pregnant?    Answer:   No    Order Specific Question:   Preferred imaging location?    Answer:   [facility]  . Ambulatory referral to Physical Therapy    Referral Priority:   Routine    Referral Type:   Physical Medicine    Referral Reason:  Specialty Services Required    Requested Specialty:   Physical Therapy   Meds ordered this encounter  Medications  . predniSONE (DELTASONE) 50 MG tablet    Sig: Take 1 tablet (50 mg total) by mouth daily.    Dispense:  5 tablet    Refill:  0  . pregabalin (LYRICA) 75 MG capsule    Sig: Take 1 capsule (75 mg total) by mouth 2 (two) times daily as needed (nerve pain).    Dispense:  60 capsule    Refill:  3    Discussed warning signs or symptoms. Please see discharge instructions. Patient expresses understanding.   The above documentation has been reviewed and is accurate and complete [name], M.D.

## 2021-03-19 NOTE — Telephone Encounter (Signed)
Patient Advocate Encounter   Received notification from [facility] Medicaid that prior authorization for Bisoprolol is required.   PA submitted on CoverMyMeds Key [id number] Status is pending   Will continue to follow.

## 2021-03-20 ENCOUNTER — Ambulatory Visit: Payer: Self-pay

## 2021-03-20 ENCOUNTER — Other Ambulatory Visit: Payer: Self-pay | Admitting: Internal Medicine

## 2021-03-20 ENCOUNTER — Ambulatory Visit (INDEPENDENT_AMBULATORY_CARE_PROVIDER_SITE_OTHER): Payer: Medicaid Other

## 2021-03-20 ENCOUNTER — Ambulatory Visit: Payer: Medicaid Other | Admitting: Family Medicine

## 2021-03-20 ENCOUNTER — Other Ambulatory Visit (HOSPITAL_COMMUNITY): Payer: Self-pay

## 2021-03-20 ENCOUNTER — Other Ambulatory Visit: Payer: Self-pay

## 2021-03-20 ENCOUNTER — Encounter: Payer: Self-pay | Admitting: Family Medicine

## 2021-03-20 VITALS — BP 130/86 | HR 87 | Ht 61.0 in | Wt 196.4 lb

## 2021-03-20 DIAGNOSIS — M25512 Pain in left shoulder: Secondary | ICD-10-CM

## 2021-03-20 DIAGNOSIS — I739 Peripheral vascular disease, unspecified: Secondary | ICD-10-CM | POA: Diagnosis not present

## 2021-03-20 DIAGNOSIS — M25511 Pain in right shoulder: Secondary | ICD-10-CM

## 2021-03-20 DIAGNOSIS — G8929 Other chronic pain: Secondary | ICD-10-CM

## 2021-03-20 DIAGNOSIS — M545 Low back pain, unspecified: Secondary | ICD-10-CM

## 2021-03-20 MED ORDER — PREDNISONE 50 MG PO TABS
50.0000 mg | ORAL_TABLET | Freq: Every day | ORAL | 0 refills | Status: DC
  Filled 2021-03-20: qty 5, 5d supply, fill #0

## 2021-03-20 MED ORDER — PREGABALIN 75 MG PO CAPS
75.0000 mg | ORAL_CAPSULE | Freq: Two times a day (BID) | ORAL | 3 refills | Status: DC | PRN
  Filled 2021-03-20: qty 60, 30d supply, fill #0
  Filled 2021-04-14 – 2021-04-16 (×2): qty 60, 30d supply, fill #1

## 2021-03-20 MED FILL — Zolpidem Tartrate Tab 10 MG: ORAL | 15 days supply | Qty: 15 | Fill #1 | Status: CN

## 2021-03-20 NOTE — Telephone Encounter (Signed)
Advanced Heart Failure Patient Advocate Encounter  Prior Authorization for Bisoprolol has been approved.    PA# [id number] Effective dates: 03/19/21 through 03/19/21  Charlann Boxer, CPhT

## 2021-03-20 NOTE — Patient Instructions (Signed)
Thank you for coming in today.  Please get an Xray today before you leave  I've referred you to Physical Therapy.  Let us know if you don't hear from them in one week.  Recheck in 1 month.   You should hear soon about the special ultrasound blood test in your legs.  Let me know if you do not hear.

## 2021-03-21 ENCOUNTER — Other Ambulatory Visit (HOSPITAL_COMMUNITY): Payer: Self-pay

## 2021-03-21 MED ORDER — ZOLPIDEM TARTRATE 10 MG PO TABS
10.0000 mg | ORAL_TABLET | Freq: Every evening | ORAL | 0 refills | Status: AC | PRN
  Filled 2021-03-26: qty 15, 30d supply, fill #0
  Filled 2021-04-16: qty 15, 30d supply, fill #1

## 2021-03-22 ENCOUNTER — Other Ambulatory Visit (HOSPITAL_COMMUNITY): Payer: Self-pay

## 2021-03-24 NOTE — Progress Notes (Signed)
X-ray shows arthritis of the small joint of the top of the right shoulder.

## 2021-03-24 NOTE — Progress Notes (Signed)
X-ray lumbar spine shows significant degenerative changes in the lumbar spine.

## 2021-03-24 NOTE — Progress Notes (Signed)
X-ray shows left shoulder arthritis

## 2021-03-24 NOTE — Progress Notes (Signed)
X-ray shows arthritis in the neck

## 2021-03-26 ENCOUNTER — Other Ambulatory Visit (HOSPITAL_COMMUNITY): Payer: Self-pay

## 2021-03-26 ENCOUNTER — Other Ambulatory Visit: Payer: Self-pay | Admitting: Internal Medicine

## 2021-03-26 NOTE — Progress Notes (Signed)
Paramedicine Encounter    Patient ID: Tracey Manning, female    DOB: 1961-05-08, 60 y.o.   MRN: [id number]   Patient Care Team: Nolene Ebbs, MD as PCP - General (Internal Medicine) [name], MD as PCP - Cardiology (Cardiology) Bensimhon, Shaune Pascal, MD as PCP - Advanced Heart Failure (Cardiology) Jorge Ny, LCSW as Social Worker (Licensed Clinical Social Worker)  Patient Active Problem List   Diagnosis Date Noted  . Acute on chronic combined systolic (congestive) and diastolic (congestive) heart failure ([place]) 03/10/2021  . Acute respiratory failure with hypoxia ([place]) 02/23/2021  . Impingement syndrome of left shoulder 03/12/2020  . Chronic pain 07/18/2019  . CRI (chronic renal insufficiency), stage 3 (moderate) 07/18/2019  . COPD (chronic obstructive pulmonary disease) ([place]) 06/28/2019  . Obesity (BMI 30-39.9) 06/28/2019  . Ischemic cardiomyopathy 06/28/2019  . Dyspnea on exertion 06/15/2019  . Acute systolic CHF (congestive heart failure) ([place])   . AKI (acute kidney injury) ([place])   . Abnormal uterine bleeding (AUB) 04/03/2016  . Dysfunctional uterine bleeding 11/14/2015  . Bacterial vaginosis (recurrent) 11/14/2015  . Atypical chest pain 11/14/2015  . Essential hypertension 04/04/2014  . Dyslipidemia, goal LDL below 70 04/04/2014  . CAD S/P multiple PCIs 04/04/2014    Current Outpatient Medications:  .  acetaminophen (TYLENOL) 500 MG tablet, TAKE 2 TABLETS BY MOUTH EVERY 6 HOURS AS NEEDED, Disp: 100 tablet, Rfl: 0 .  albuterol (PROVENTIL) (2.5 MG/3ML) 0.083% nebulizer solution, INHALE 1 VIAL VIA NEBULIZER EVERY 6 HOURS AS NEEDED (Patient taking differently: Take 2.5 mg by nebulization every 6 (six) hours as needed for wheezing or shortness of breath.), Disp: 90 mL, Rfl: 11 .  albuterol (VENTOLIN HFA) 108 (90 Base) MCG/ACT inhaler, INHALE 2 PUFFS EVERY 4 HOURS AS NEEDED (Patient taking differently: Inhale 2 puffs into the lungs every 4 (four) hours as needed for  shortness of breath or wheezing.), Disp: 8.5 g, Rfl: 5 .  aspirin 81 MG EC tablet, Take 1 tablet (81 mg total) by mouth daily., Disp: 90 tablet, Rfl: 3 .  atorvastatin (LIPITOR) 80 MG tablet, TAKE 1 TABLET BY MOUTH ONCE A DAY, Disp: 90 tablet, Rfl: 3 .  benzonatate (TESSALON) 100 MG capsule, Take 1 capsule (100 mg total) by mouth 2 (two) times daily. (Patient taking differently: Take 100 mg by mouth 2 (two) times daily as needed for cough.), Disp: 20 capsule, Rfl: 0 .  bisoprolol (ZEBETA) 5 MG tablet, Take 1 tablet (5 mg total) by mouth daily. (Patient not taking: Reported on 03/19/2021), Disp: 30 tablet, Rfl: 3 .  cetirizine (ZYRTEC) 10 MG tablet, TAKE 1 TABLET BY MOUTH ONCE A DAY AS NEEDED (Patient taking differently: Take 10 mg by mouth in the morning.), Disp: 90 tablet, Rfl: 2 .  clopidogrel (PLAVIX) 75 MG tablet, TAKE 1 TABLET BY MOUTH ONCE A DAY, Disp: 90 tablet, Rfl: 3 .  dapagliflozin propanediol (FARXIGA) 10 MG TABS tablet, TAKE 1 TABLET BY MOUTH DAILY BEFORE BREAKFAST, Disp: 90 tablet, Rfl: 3 .  diclofenac Sodium (VOLTAREN) 1 % GEL, APPLY 4 GRAMS FOUR TIMES DAILY AS NEEDED FOR PAINS AS DIRECTED (Patient taking differently: Apply 4 g topically 4 (four) times daily as needed (as directed- for pain).), Disp: 500 g, Rfl: 5 .  Evolocumab 140 MG/ML SOAJ, INJECT 1 DOSE INTO THE SKIN EVERY 14 (FOURTEEN) DAYS. (Patient not taking: Reported on 03/19/2021), Disp: 2 mL, Rfl: 11 .  Fluticasone-Umeclidin-Vilant (TRELEGY ELLIPTA) 200-62.5-25 MCG/INH AEPB, Inhale 1 puff into the lungs daily as needed (wheezing/sob).,  Disp: , Rfl:  .  furosemide (LASIX) 40 MG tablet, TAKE 1 TABLET BY MOUTH DAILY., Disp: 90 tablet, Rfl: 3 .  hydrOXYzine (ATARAX/VISTARIL) 25 MG tablet, Take 1 tablet (25 mg total) by mouth 3 (three) times daily as needed for anxiety., Disp: 30 tablet, Rfl: 0 .  nitroGLYCERIN (NITROSTAT) 0.4 MG SL tablet, DISSOLVE 1 TABLET UNDER THE TONGUE AS NEEDED AS DIRECTED, Disp: 25 tablet, Rfl: 5 .  ofloxacin  (OCUFLOX) 0.3 % ophthalmic solution, INSTILL 1 DROP 3 TIMES DAILY IN OPERATIVE EYE STARTING 2 DAYS PRIOR TO SURGERY AND AFTER SURGERY FOR 3 WEEKS. (Patient taking differently: Place 1 drop into the left eye See admin instructions. Instill 1 drop three times daily in operative eye starting 2 days prior to surgery and after surgery for 3 weeks), Disp: 5 mL, Rfl: 1 .  potassium chloride (KLOR-CON) 10 MEQ tablet, Take 2 tablets (20 mEq total) by mouth 2 (two) times daily., Disp: 120 tablet, Rfl: 11 .  prednisoLONE acetate (PRED FORTE) 1 % ophthalmic suspension, APPLY ONE DROP IN OPERATIVE EYE(S) 3 TIMES DAILY STARTING 2 DAYS BEFORE SURGERY AND AFTER SURGERY FOR 3 WEEKS (Patient taking differently: Place 1 drop into the left eye See admin instructions. Apply one drop in operative eye three times daily starting 2 days before surgery and after surgery for 3 weeks), Disp: 5 mL, Rfl: 1 .  predniSONE (DELTASONE) 50 MG tablet, Take 1 tablet (50 mg total) by mouth daily., Disp: 5 tablet, Rfl: 0 .  pregabalin (LYRICA) 75 MG capsule, Take 1 capsule (75 mg total) by mouth 2 (two) times daily as needed (nerve pain)., Disp: 60 capsule, Rfl: 3 .  sacubitril-valsartan (ENTRESTO) 97-103 MG, Take 1 tablet by mouth 2 (two) times daily., Disp: 180 tablet, Rfl: 3 .  spironolactone (ALDACTONE) 25 MG tablet, TAKE 1 TABLET BY MOUTH DAILY, Disp: 30 tablet, Rfl: 3 .  Vitamin D, Ergocalciferol, (DRISDOL) 1.25 MG (50000 UNIT) CAPS capsule, TAKE 1 CAPSULE BY MOUTH ONCE A WEEK (Patient taking differently: Take 50,000 Units by mouth once a week.), Disp: 4 capsule, Rfl: 5 .  zolpidem (AMBIEN) 10 MG tablet, TAKE 1 TABLET BY MOUTH AT BEDTIME AS NEEDED FOR SLEEP (Patient taking differently: Take 10 mg by mouth at bedtime as needed for sleep.), Disp: 30 tablet, Rfl: 0 .  zolpidem (AMBIEN) 10 MG tablet, Take 1 tablet (10 mg total) by mouth at bedtime as needed for sleep, Disp: 30 tablet, Rfl: 0 Allergies  Allergen Reactions  . Coreg  [Carvedilol] Other (See Comments)    Headaches  . Strawberry Extract Anaphylaxis, Hives, Swelling and Other (See Comments)    "Fresh strawberries" made the throat swell (2014)  . Imdur [Isosorbide Nitrate] Other (See Comments)    Headaches   . Amoxicillin Nausea And Vomiting    Did it involve swelling of the face/tongue/throat, SOB, or low BP? Yes Did it involve sudden or severe rash/hives, skin peeling, or any reaction on the inside of your mouth or nose? Yes Did you need to seek medical attention at a hospital or doctor's office? Yes When did it last happen?within last 10 years If all above answers are "NO", may proceed with cephalosporin use.   . Dilaudid [Hydromorphone Hcl] Nausea And Vomiting  . Ibuprofen Other (See Comments)    Wheezing   . Latex Swelling and Other (See Comments)    No reaction with Band-Aids, though  . Metoprolol Other (See Comments)    Headaches and wheezing     Social History  Socioeconomic History  . Marital status: Single    Spouse name: Not on file  . Number of children: 2  . Years of education: 81  . Highest education level: High school graduate  Occupational History  . Not on file  Tobacco Use  . Smoking status: Current Every Day Smoker    Packs/day: 0.50    Years: 34.00    Pack years: 17.00    Types: Cigarettes  . Smokeless tobacco: Never Used  Vaping Use  . Vaping Use: Never used  Substance and Sexual Activity  . Alcohol use: No    Alcohol/week: 0.0 standard drinks    Comment: seldom  . Drug use: Yes    Types: Marijuana    Comment: 06/19/19  . Sexual activity: Yes    Partners: Male    Birth control/protection: Post-menopausal  Other Topics Concern  . Not on file  Social History Narrative  . Not on file   Social Determinants of Health   Financial Resource Strain: Medium Risk  . Difficulty of Paying Living Expenses: Somewhat hard  Food Insecurity: Food Insecurity Present  . Worried About Charity fundraiser in the  Last Year: Sometimes true  . Ran Out of Food in the Last Year: Sometimes true  Transportation Needs: Unmet Transportation Needs  . Lack of Transportation (Medical): No  . Lack of Transportation (Non-Medical): Yes  Physical Activity: Not on file  Stress: Not on file  Social Connections: Not on file  Intimate Partner Violence: Not on file    Physical Exam Vitals reviewed.  Constitutional:      Appearance: Normal appearance. She is normal weight.  HENT:     Head: Normocephalic.     Nose: Nose normal.     Mouth/Throat:     Mouth: Mucous membranes are moist.     Pharynx: Oropharynx is clear.  Eyes:     Conjunctiva/sclera: Conjunctivae normal.     Pupils: Pupils are equal, round, and reactive to light.  Cardiovascular:     Rate and Rhythm: Normal rate and regular rhythm.     Pulses: Normal pulses.     Heart sounds: Normal heart sounds.  Pulmonary:     Effort: Pulmonary effort is normal.     Breath sounds: Normal breath sounds.  Abdominal:     General: Abdomen is flat.     Palpations: Abdomen is soft.  Musculoskeletal:        General: No swelling. Normal range of motion.     Cervical back: Normal range of motion.     Right lower leg: No edema.     Left lower leg: No edema.  Skin:    General: Skin is warm and dry.     Capillary Refill: Capillary refill takes less than 2 seconds.  Neurological:     General: No focal deficit present.     Mental Status: She is alert. Mental status is at baseline.  Psychiatric:        Mood and Affect: Mood normal.     Arrived for home visit for Jamauria who was alert and oriented reporting to be feeling good with no complaints. Fadra denied chest pain, dizziness, shortness of breath, trouble taking medications, sleeping or eating. Anastasija's vitals were obtained as noted in report. No edema noted, no swelling noted, lung sounds clear. I reviewed medications with Shakemia and filled pill box accordingly. Niyanna missing Hydroxyzine and Ambien as pharmacy  has not filled same or delivered yet. I called [facility] pharmacy and they  will be sending out Bisoprolol, Ambien and sent refill request to Dr. Jeanie Cooks. I will reach out to PCP for same also.   Arlis reports she is having an eye surgery done next week and needs to stop Aspirin and Plavix 5 days before procedure. I made sure pill box reflected same.   I will see Caylyn in one week after her eye surgery.   Appointments confirmed and Dametria has already set up transportation for all of the upcoming appointments.       Future Appointments  Date Time Provider [place]  03/27/2021  2:00 PM [facility] 4 [facility]  04/08/2021 11:30 AM [facility] PA/NP [facility] None  04/23/2021 10:30 AM Gregor Hams, MD [facility] None     ACTION: Home visit completed Next visit planned for one week

## 2021-03-27 ENCOUNTER — Other Ambulatory Visit: Payer: Self-pay

## 2021-03-27 ENCOUNTER — Other Ambulatory Visit: Payer: Self-pay | Admitting: Family Medicine

## 2021-03-27 ENCOUNTER — Ambulatory Visit (HOSPITAL_COMMUNITY)
Admission: RE | Admit: 2021-03-27 | Discharge: 2021-03-27 | Disposition: A | Discharge status: Home / Self Care | Payer: Medicaid Other | Source: Ambulatory Visit | Attending: Cardiology | Admitting: Cardiology

## 2021-03-27 ENCOUNTER — Other Ambulatory Visit (HOSPITAL_COMMUNITY): Payer: Self-pay

## 2021-03-27 DIAGNOSIS — M25512 Pain in left shoulder: Secondary | ICD-10-CM | POA: Diagnosis not present

## 2021-03-27 DIAGNOSIS — M25511 Pain in right shoulder: Secondary | ICD-10-CM | POA: Diagnosis present

## 2021-03-27 DIAGNOSIS — M545 Low back pain, unspecified: Secondary | ICD-10-CM

## 2021-03-27 DIAGNOSIS — I739 Peripheral vascular disease, unspecified: Secondary | ICD-10-CM

## 2021-03-27 DIAGNOSIS — G8929 Other chronic pain: Secondary | ICD-10-CM | POA: Insufficient documentation

## 2021-03-27 MED ORDER — HYDROXYZINE HCL 25 MG PO TABS
25.0000 mg | ORAL_TABLET | Freq: Three times a day (TID) | ORAL | 2 refills | Status: DC | PRN
  Filled 2021-03-27: qty 90, 30d supply, fill #0

## 2021-03-27 NOTE — Telephone Encounter (Signed)
Patient was unable to sign form and return to our office and [facility] closed her PA/Appeal case on 03/12/21  Received staff message from [place] EMT who can assist in getting patient to sign paperwork needed Jeris Penta, EMT  Fidel Levy, RN [place], I am Jeris Penta. I am Schwanda's community paramedic with the [facility] and was trying to assist her in getting her [place]atha. She states there is a paper she has to sign for the appeal but was hospitalized and has been unable to complete this. I am able to assist her with this if your able to send it to me I can print it, have her sign it and get it faxed to you all or the correct correspondent. Let me know how I can help. Thanks!   [place] Paramedic  [phone]  [email or web address]

## 2021-03-27 NOTE — Telephone Encounter (Signed)
Spoke with [place] and was advised that ALL appeal information can be resubmitted along with form that patient needs to sign (emailed to [employer] today)  MD will also need to sign form

## 2021-03-28 NOTE — Progress Notes (Signed)
Blood flow test looks normal in the legs.

## 2021-04-01 ENCOUNTER — Other Ambulatory Visit (HOSPITAL_COMMUNITY): Payer: Self-pay

## 2021-04-01 NOTE — Progress Notes (Signed)
Paramedicine Encounter    Patient ID: Tracey Manning, female    DOB: 06/20/1961, 60 y.o.   MRN: [id number]   Patient Care Team: Nolene Ebbs, MD as PCP - General (Internal Medicine) [name], MD as PCP - Cardiology (Cardiology) Bensimhon, Shaune Pascal, MD as PCP - Advanced Heart Failure (Cardiology) Jorge Ny, LCSW as Social Worker (Licensed Clinical Social Worker)  Patient Active Problem List   Diagnosis Date Noted  . Acute on chronic combined systolic (congestive) and diastolic (congestive) heart failure ([place]) 03/10/2021  . Acute respiratory failure with hypoxia ([place]) 02/23/2021  . Impingement syndrome of left shoulder 03/12/2020  . Chronic pain 07/18/2019  . CRI (chronic renal insufficiency), stage 3 (moderate) 07/18/2019  . COPD (chronic obstructive pulmonary disease) ([place]) 06/28/2019  . Obesity (BMI 30-39.9) 06/28/2019  . Ischemic cardiomyopathy 06/28/2019  . Dyspnea on exertion 06/15/2019  . Acute systolic CHF (congestive heart failure) ([place])   . AKI (acute kidney injury) ([place])   . Abnormal uterine bleeding (AUB) 04/03/2016  . Dysfunctional uterine bleeding 11/14/2015  . Bacterial vaginosis (recurrent) 11/14/2015  . Atypical chest pain 11/14/2015  . Essential hypertension 04/04/2014  . Dyslipidemia, goal LDL below 70 04/04/2014  . CAD S/P multiple PCIs 04/04/2014    Current Outpatient Medications:  .  acetaminophen (TYLENOL) 500 MG tablet, TAKE 2 TABLETS BY MOUTH EVERY 6 HOURS AS NEEDED, Disp: 100 tablet, Rfl: 0 .  albuterol (PROVENTIL) (2.5 MG/3ML) 0.083% nebulizer solution, INHALE 1 VIAL VIA NEBULIZER EVERY 6 HOURS AS NEEDED (Patient taking differently: Take 2.5 mg by nebulization every 6 (six) hours as needed for wheezing or shortness of breath.), Disp: 90 mL, Rfl: 11 .  albuterol (VENTOLIN HFA) 108 (90 Base) MCG/ACT inhaler, INHALE 2 PUFFS EVERY 4 HOURS AS NEEDED (Patient taking differently: Inhale 2 puffs into the lungs every 4 (four) hours as needed for  shortness of breath or wheezing.), Disp: 8.5 g, Rfl: 5 .  aspirin 81 MG EC tablet, Take 1 tablet (81 mg total) by mouth daily., Disp: 90 tablet, Rfl: 3 .  atorvastatin (LIPITOR) 80 MG tablet, TAKE 1 TABLET BY MOUTH ONCE A DAY, Disp: 90 tablet, Rfl: 3 .  benzonatate (TESSALON) 100 MG capsule, Take 1 capsule (100 mg total) by mouth 2 (two) times daily. (Patient taking differently: Take 100 mg by mouth 2 (two) times daily as needed for cough.), Disp: 20 capsule, Rfl: 0 .  bisoprolol (ZEBETA) 5 MG tablet, Take 1 tablet (5 mg total) by mouth daily. (Patient not taking: No sig reported), Disp: 30 tablet, Rfl: 3 .  cetirizine (ZYRTEC) 10 MG tablet, TAKE 1 TABLET BY MOUTH ONCE A DAY AS NEEDED (Patient taking differently: Take 10 mg by mouth daily as needed for allergies.), Disp: 90 tablet, Rfl: 2 .  clopidogrel (PLAVIX) 75 MG tablet, TAKE 1 TABLET BY MOUTH ONCE A DAY, Disp: 90 tablet, Rfl: 3 .  dapagliflozin propanediol (FARXIGA) 10 MG TABS tablet, TAKE 1 TABLET BY MOUTH DAILY BEFORE BREAKFAST, Disp: 90 tablet, Rfl: 3 .  diclofenac Sodium (VOLTAREN) 1 % GEL, APPLY 4 GRAMS FOUR TIMES DAILY AS NEEDED FOR PAINS AS DIRECTED (Patient taking differently: Apply 4 g topically 4 (four) times daily as needed (as directed- for pain).), Disp: 500 g, Rfl: 5 .  Evolocumab 140 MG/ML SOAJ, INJECT 1 DOSE INTO THE SKIN EVERY 14 (FOURTEEN) DAYS. (Patient not taking: No sig reported), Disp: 2 mL, Rfl: 11 .  Fluticasone-Umeclidin-Vilant (TRELEGY ELLIPTA) 200-62.5-25 MCG/INH AEPB, Inhale 1 puff into the lungs daily as  needed (wheezing/sob)., Disp: , Rfl:  .  furosemide (LASIX) 40 MG tablet, TAKE 1 TABLET BY MOUTH DAILY., Disp: 90 tablet, Rfl: 3 .  hydrOXYzine (ATARAX/VISTARIL) 25 MG tablet, Take 1 tablet (25 mg total) by mouth 3 (three) times daily as needed for anxiety, Disp: 90 tablet, Rfl: 2 .  nitroGLYCERIN (NITROSTAT) 0.4 MG SL tablet, DISSOLVE 1 TABLET UNDER THE TONGUE AS NEEDED AS DIRECTED, Disp: 25 tablet, Rfl: 5 .   ofloxacin (OCUFLOX) 0.3 % ophthalmic solution, INSTILL 1 DROP 3 TIMES DAILY IN OPERATIVE EYE STARTING 2 DAYS PRIOR TO SURGERY AND AFTER SURGERY FOR 3 WEEKS. (Patient taking differently: Place 1 drop into the left eye See admin instructions. Instill 1 drop three times daily in operative eye starting 2 days prior to surgery and after surgery for 3 weeks), Disp: 5 mL, Rfl: 1 .  potassium chloride (KLOR-CON) 10 MEQ tablet, Take 2 tablets (20 mEq total) by mouth 2 (two) times daily., Disp: 120 tablet, Rfl: 11 .  prednisoLONE acetate (PRED FORTE) 1 % ophthalmic suspension, APPLY ONE DROP IN OPERATIVE EYE(S) 3 TIMES DAILY STARTING 2 DAYS BEFORE SURGERY AND AFTER SURGERY FOR 3 WEEKS (Patient taking differently: Place 1 drop into the left eye See admin instructions. Apply one drop in operative eye three times daily starting 2 days before surgery and after surgery for 3 weeks), Disp: 5 mL, Rfl: 1 .  predniSONE (DELTASONE) 50 MG tablet, Take 1 tablet (50 mg total) by mouth daily., Disp: 5 tablet, Rfl: 0 .  pregabalin (LYRICA) 75 MG capsule, Take 1 capsule (75 mg total) by mouth 2 (two) times daily as needed (nerve pain)., Disp: 60 capsule, Rfl: 3 .  sacubitril-valsartan (ENTRESTO) 97-103 MG, Take 1 tablet by mouth 2 (two) times daily., Disp: 180 tablet, Rfl: 3 .  spironolactone (ALDACTONE) 25 MG tablet, TAKE 1 TABLET BY MOUTH DAILY, Disp: 30 tablet, Rfl: 3 .  Vitamin D, Ergocalciferol, (DRISDOL) 1.25 MG (50000 UNIT) CAPS capsule, TAKE 1 CAPSULE BY MOUTH ONCE A WEEK (Patient taking differently: Take 50,000 Units by mouth once a week.), Disp: 4 capsule, Rfl: 5 .  zolpidem (AMBIEN) 10 MG tablet, TAKE 1 TABLET BY MOUTH AT BEDTIME AS NEEDED FOR SLEEP (Patient taking differently: Take 10 mg by mouth at bedtime as needed for sleep.), Disp: 30 tablet, Rfl: 0 .  zolpidem (AMBIEN) 10 MG tablet, Take 1 tablet (10 mg total) by mouth at bedtime as needed for sleep, Disp: 30 tablet, Rfl: 0 Allergies  Allergen Reactions  . Coreg  [Carvedilol] Other (See Comments)    Headaches  . Strawberry Extract Anaphylaxis, Hives, Swelling and Other (See Comments)    "Fresh strawberries" made the throat swell (2014)  . Imdur [Isosorbide Nitrate] Other (See Comments)    Headaches   . Amoxicillin Nausea And Vomiting    Did it involve swelling of the face/tongue/throat, SOB, or low BP? Yes Did it involve sudden or severe rash/hives, skin peeling, or any reaction on the inside of your mouth or nose? Yes Did you need to seek medical attention at a hospital or doctor's office? Yes When did it last happen?within last 10 years If all above answers are "NO", may proceed with cephalosporin use.   . Dilaudid [Hydromorphone Hcl] Nausea And Vomiting  . Ibuprofen Other (See Comments)    Wheezing   . Latex Swelling and Other (See Comments)    No reaction with Band-Aids, though  . Metoprolol Other (See Comments)    Headaches and wheezing  Social History   Socioeconomic History  . Marital status: Single    Spouse name: Not on file  . Number of children: 2  . Years of education: 39  . Highest education level: High school graduate  Occupational History  . Not on file  Tobacco Use  . Smoking status: Current Every Day Smoker    Packs/day: 0.50    Years: 34.00    Pack years: 17.00    Types: Cigarettes  . Smokeless tobacco: Never Used  Vaping Use  . Vaping Use: Never used  Substance and Sexual Activity  . Alcohol use: No    Alcohol/week: 0.0 standard drinks    Comment: seldom  . Drug use: Yes    Types: Marijuana    Comment: 06/19/19  . Sexual activity: Yes    Partners: Male    Birth control/protection: Post-menopausal  Other Topics Concern  . Not on file  Social History Narrative  . Not on file   Social Determinants of Health   Financial Resource Strain: Medium Risk  . Difficulty of Paying Living Expenses: Somewhat hard  Food Insecurity: Food Insecurity Present  . Worried About Charity fundraiser in the  Last Year: Sometimes true  . Ran Out of Food in the Last Year: Sometimes true  Transportation Needs: Unmet Transportation Needs  . Lack of Transportation (Medical): No  . Lack of Transportation (Non-Medical): Yes  Physical Activity: Not on file  Stress: Not on file  Social Connections: Not on file  Intimate Partner Violence: Not on file    Physical Exam Vitals reviewed.  Constitutional:      Appearance: Normal appearance. She is normal weight.  HENT:     Head: Normocephalic.     Nose: Nose normal.     Mouth/Throat:     Mouth: Mucous membranes are moist.     Pharynx: Oropharynx is clear.  Eyes:     Conjunctiva/sclera: Conjunctivae normal.     Pupils: Pupils are equal, round, and reactive to light.  Cardiovascular:     Rate and Rhythm: Normal rate and regular rhythm.     Pulses: Normal pulses.     Heart sounds: Normal heart sounds.  Abdominal:     General: Abdomen is flat.     Palpations: Abdomen is soft.  Musculoskeletal:        General: Normal range of motion.     Cervical back: Normal range of motion.     Right lower leg: No edema.     Left lower leg: No edema.  Skin:    General: Skin is warm and dry.     Capillary Refill: Capillary refill takes less than 2 seconds.  Neurological:     General: No focal deficit present.     Mental Status: She is alert. Mental status is at baseline.  Psychiatric:        Mood and Affect: Mood normal.     Arrived for home visit for Charlean who was alert and oriented reported to be feeling good today with no complaints. Maryuri denied shortness of breath, chest pain or dizziness. Nyoka reports she is having eye surgery tomorrow, and was instructed to only take Entresto prior to surgery. I ensured Leonna understood same and she did. I reviewed medications and filled pill box accordingly. Katharina understood next week we will meet at HF clinic rather than in the home for her appointment. I obtained vitals and they are as noted in report. Xochilth has  been compliant with medications and  has had no complaints. No chest pain, dizziness or shortness of breath. Lung sounds clear with no JVD or pedal edema noted.  I plan to see Fortune next week. Home visit complete.   Refills: Spironolactone Prednisone eye drops Ofloxacin eye drops   -Repatha paperwork signed and faxed to Sheral Apley RN with Dr. Rod Mae office.   Future Appointments  Date Time Provider [place]  04/08/2021 11:30 AM [facility] PA/NP [facility] None  04/15/2021 11:30 AM Mathis Dad, PT [facility]  04/23/2021 10:30 AM Gregor Hams, MD [facility] None     ACTION: Home visit completed Next visit planned for one week in clinic

## 2021-04-02 ENCOUNTER — Telehealth: Payer: Self-pay | Admitting: Internal Medicine

## 2021-04-02 ENCOUNTER — Telehealth: Payer: Self-pay | Admitting: Cardiovascular Disease

## 2021-04-02 NOTE — Telephone Encounter (Signed)
[facility] call to say they received the appeal letter for patient medication. They will review the information and call us back once they have a decision made.

## 2021-04-02 NOTE — Telephone Encounter (Signed)
[employer] is calling to inform of an appeal decirion for this pt. The appeal has been approved for 1 year.Please advise

## 2021-04-02 NOTE — Telephone Encounter (Signed)
Tracey Manning   AB  04/02/21 4:26 PM Note [employer] is calling to inform of an appeal decirion for this pt. The appeal has been approved for 1 year.Please advise

## 2021-04-02 NOTE — Telephone Encounter (Signed)
Dr. Lysbeth Penner nurse made aware. Please see encounter on 3/11

## 2021-04-02 NOTE — Telephone Encounter (Signed)
Faxed MD note, baseline LDL and current LDL, denial letter, signed form from patient to [place] @ [phone]

## 2021-04-02 NOTE — Telephone Encounter (Signed)
No return call to check on which medication. Will await for return call.

## 2021-04-07 ENCOUNTER — Other Ambulatory Visit (HOSPITAL_COMMUNITY): Payer: Self-pay

## 2021-04-07 ENCOUNTER — Telehealth (HOSPITAL_COMMUNITY): Payer: Self-pay

## 2021-04-07 MED ORDER — CLOPIDOGREL BISULFATE 75 MG PO TABS
75.0000 mg | ORAL_TABLET | Freq: Every day | ORAL | 2 refills | Status: DC
  Filled 2021-04-07: qty 90, 90d supply, fill #0

## 2021-04-07 MED ORDER — ZOLPIDEM TARTRATE 10 MG PO TABS
10.0000 mg | ORAL_TABLET | Freq: Every evening | ORAL | 2 refills | Status: DC | PRN
  Filled 2021-04-07: qty 30, 30d supply, fill #0

## 2021-04-07 MED ORDER — CLOTRIMAZOLE-BETAMETHASONE 1-0.05 % EX CREA
TOPICAL_CREAM | CUTANEOUS | 2 refills | Status: DC
  Filled 2021-04-07: qty 30, 10d supply, fill #0

## 2021-04-07 MED ORDER — ISOSORBIDE MONONITRATE ER 30 MG PO TB24
30.0000 mg | ORAL_TABLET | Freq: Every day | ORAL | 2 refills | Status: DC
  Filled 2021-04-07: qty 90, 90d supply, fill #0

## 2021-04-07 MED FILL — Prednisolone Acetate Ophth Susp 1%: OPHTHALMIC | 21 days supply | Qty: 5 | Fill #0 | Status: AC

## 2021-04-07 MED FILL — Ofloxacin Ophth Soln 0.3%: OPHTHALMIC | 21 days supply | Qty: 5 | Fill #0 | Status: AC

## 2021-04-07 NOTE — Progress Notes (Signed)
ADVANCED HF CLINIC PROGRESS NOTE  Primary Care: Dr. Tacy Dura  Primary Cardiologist: Dr. Gwenlyn Found HF Cardiologist: Dr. Haroldine Laws   HPI: Tracey Manning is 60 y/o woman with morbid obesity, CAD s/p previous PCI, COPD with ongoing tobacco use, OSA (intolerant of CPAP), borderline DM2, HTN, HL and systolic HF due to iCM.   She had a remote PCI in 2004 in [place]. In 2008 she had a circumflex PCI by Dr. Claiborne Billings. She had known occluded RCA with left-to-right collaterals. In 2012 she had an LAD stent placedby Dr. Claiborne Billings.  She was admitted 06/11/2019 with acute respiratory failure and unstable angina. She had new LV dysfunction with an EF of 25%. She had acute kidney injury as well with a creatinine of 1.8. the patient was admitted, diuresed, and underwent diagnostic catheterization 06/15/2019. This revealed a 60 to 70% mid circumflex stenosis, known occluded RCA with left-to-right collaterals, and a 95% proximal LAD before the previously placed stent which was 50 to 60% narrowed. After review the patient was turned down for CABG and she underwent intervention to her LAD on 06/19/2019 by Dr. Gwenlyn Found.  She had a f/u echo 10/20 which revealed her EF to be unchanged at 25-30%. Admitted 02/23/21 with increased shortness of breath. Diuresed with IV lasix and discharged the next day.   Sent to ED 03/10/21 with chest pain. HsTrop with low flat trend in the 20s, not c/w ACS. Felt to be musculoskeletal. Referred to Sports Medicine. Discharged to 03/12/21.   Today she returns for HF follow up.Overall feeling fine. Denies SOB/PND/Orthopnea. No chest pain. Appetite ok. No fever or chills. Weight at home 196-200  pounds. Taking all medications. Smoking 10 cigarettes per day. Followed by HF Paramedicine for medication managment. Uses [facility].   Zio 12/21:  1. Sinus rhythm - avg HR of 90 bpm. 2. One 6-beat run NSVT 3. Five runs of SVT - the longest lasting 24.6 secs with an avg rate of 119 bpm.  Isolated SVEs were rare (<1.0%) 4. Occasional PVCs (4.0%, [id number]) - however on one day there was 10.5% burden and another ay 8.7% burden.   ECHO 02/2021: EF 30-35%  Echo 06/19/20: EF 40-45% RV ok   Past Medical History:  Diagnosis Date  . Anxiety   . Arthritis   . Asthma   . Barrett's esophagus   . Chest pain   . Collagen vascular disease ([place])   . Colon polyps   . Coronary artery disease   . Depression   . Dyslipidemia   . GERD (gastroesophageal reflux disease)    occ  . Hyperlipidemia   . Hypertension   . Myocardial infarct ([place]) [id number]  . Sciatica   . Sleep apnea    no CPAP ordered but using oxygen at bedtime  . Tobacco abuse     Current Outpatient Medications  Medication Sig Dispense Refill  . acetaminophen (TYLENOL) 500 MG tablet TAKE 2 TABLETS BY MOUTH EVERY 6 HOURS AS NEEDED 100 tablet 0  . albuterol (PROVENTIL) (2.5 MG/3ML) 0.083% nebulizer solution INHALE 1 VIAL VIA NEBULIZER EVERY 6 HOURS AS NEEDED (Patient taking differently: Take 2.5 mg by nebulization every 6 (six) hours as needed for wheezing or shortness of breath.) 90 mL 11  . albuterol (VENTOLIN HFA) 108 (90 Base) MCG/ACT inhaler INHALE 2 PUFFS EVERY 4 HOURS AS NEEDED (Patient taking differently: Inhale 2 puffs into the lungs every 4 (four) hours as needed for shortness of breath or wheezing.) 8.5 g 5  . aspirin 81 MG EC  tablet Take 1 tablet (81 mg total) by mouth daily. 90 tablet 3  . atorvastatin (LIPITOR) 80 MG tablet TAKE 1 TABLET BY MOUTH ONCE A DAY 90 tablet 3  . benzonatate (TESSALON) 100 MG capsule Take 1 capsule (100 mg total) by mouth 2 (two) times daily. (Patient taking differently: Take 100 mg by mouth 2 (two) times daily as needed for cough.) 20 capsule 0  . bisoprolol (ZEBETA) 5 MG tablet Take 1 tablet (5 mg total) by mouth daily. 30 tablet 3  . cetirizine (ZYRTEC) 10 MG tablet TAKE 1 TABLET BY MOUTH ONCE A DAY AS NEEDED (Patient taking differently: Take 10 mg by mouth daily as needed for  allergies.) 90 tablet 2  . clopidogrel (PLAVIX) 75 MG tablet Take 1 tablet (75 mg total) by mouth daily. 90 tablet 2  . dapagliflozin propanediol (FARXIGA) 10 MG TABS tablet TAKE 1 TABLET BY MOUTH DAILY BEFORE BREAKFAST 90 tablet 3  . diclofenac Sodium (VOLTAREN) 1 % GEL APPLY 4 GRAMS FOUR TIMES DAILY AS NEEDED FOR PAINS AS DIRECTED 500 g 5  . Fluticasone-Umeclidin-Vilant (TRELEGY ELLIPTA) 200-62.5-25 MCG/INH AEPB Inhale 1 puff into the lungs daily as needed (wheezing/sob).    . furosemide (LASIX) 40 MG tablet TAKE 1 TABLET BY MOUTH DAILY. 90 tablet 3  . hydrOXYzine (ATARAX/VISTARIL) 25 MG tablet Take 1 tablet (25 mg total) by mouth 3 (three) times daily as needed for anxiety 90 tablet 2  . ofloxacin (OCUFLOX) 0.3 % ophthalmic solution INSTILL 1 DROP 3 TIMES DAILY IN OPERATIVE EYE STARTING 2 DAYS PRIOR TO SURGERY AND AFTER SURGERY FOR 3 WEEKS. (Patient taking differently: Place 1 drop into the left eye See admin instructions. Instill 1 drop three times daily in operative eye starting 2 days prior to surgery and after surgery for 3 weeks) 5 mL 1  . potassium chloride (KLOR-CON) 10 MEQ tablet Take 2 tablets (20 mEq total) by mouth 2 (two) times daily. 120 tablet 11  . prednisoLONE acetate (PRED FORTE) 1 % ophthalmic suspension APPLY ONE DROP IN OPERATIVE EYE(S) 3 TIMES DAILY STARTING 2 DAYS BEFORE SURGERY AND AFTER SURGERY FOR 3 WEEKS (Patient taking differently: Place 1 drop into the left eye See admin instructions. Apply one drop in operative eye three times daily starting 2 days before surgery and after surgery for 3 weeks) 5 mL 1  . pregabalin (LYRICA) 75 MG capsule Take 1 capsule (75 mg total) by mouth 2 (two) times daily as needed (nerve pain). 60 capsule 3  . sacubitril-valsartan (ENTRESTO) 97-103 MG Take 1 tablet by mouth 2 (two) times daily. 180 tablet 3  . spironolactone (ALDACTONE) 25 MG tablet TAKE 1 TABLET BY MOUTH DAILY 30 tablet 3  . Vitamin D, Ergocalciferol, (DRISDOL) 1.25 MG (50000 UNIT)  CAPS capsule TAKE 1 CAPSULE BY MOUTH ONCE A WEEK (Patient taking differently: Take 50,000 Units by mouth once a week.) 4 capsule 5  . zolpidem (AMBIEN) 10 MG tablet Take 1 tablet (10 mg total) by mouth at bedtime as needed for sleep 30 tablet 0  . clotrimazole-betamethasone (LOTRISONE) cream Apply 2 times a day as needed (Patient not taking: Reported on 04/08/2021) 30 g 2  . Evolocumab 140 MG/ML SOAJ INJECT 1 DOSE INTO THE SKIN EVERY 14 (FOURTEEN) DAYS. (Patient not taking: Reported on 04/08/2021) 2 mL 11  . nitroGLYCERIN (NITROSTAT) 0.4 MG SL tablet DISSOLVE 1 TABLET UNDER THE TONGUE AS NEEDED AS DIRECTED (Patient not taking: Reported on 04/08/2021) 25 tablet 5   No current facility-administered medications for  this encounter.    Allergies  Allergen Reactions  . Coreg [Carvedilol] Other (See Comments)    Headaches  . Strawberry Extract Anaphylaxis, Hives, Swelling and Other (See Comments)    "Fresh strawberries" made the throat swell (2014)  . Imdur [Isosorbide Nitrate] Other (See Comments)    Headaches   . Amoxicillin Nausea And Vomiting    Did it involve swelling of the face/tongue/throat, SOB, or low BP? Yes Did it involve sudden or severe rash/hives, skin peeling, or any reaction on the inside of your mouth or nose? Yes Did you need to seek medical attention at a hospital or doctor's office? Yes When did it last happen?within last 10 years If all above answers are "NO", may proceed with cephalosporin use.   . Dilaudid [Hydromorphone Hcl] Nausea And Vomiting  . Ibuprofen Other (See Comments)    Wheezing   . Latex Swelling and Other (See Comments)    No reaction with Band-Aids, though  . Metoprolol Other (See Comments)    Headaches and wheezing     Social History   Socioeconomic History  . Marital status: Single    Spouse name: Not on file  . Number of children: 2  . Years of education: 59  . Highest education level: High school graduate  Occupational History  . Not  on file  Tobacco Use  . Smoking status: Current Every Day Smoker    Packs/day: 0.50    Years: 34.00    Pack years: 17.00    Types: Cigarettes  . Smokeless tobacco: Never Used  Vaping Use  . Vaping Use: Never used  Substance and Sexual Activity  . Alcohol use: No    Alcohol/week: 0.0 standard drinks    Comment: seldom  . Drug use: Yes    Types: Marijuana    Comment: 06/19/19  . Sexual activity: Yes    Partners: Male    Birth control/protection: Post-menopausal  Other Topics Concern  . Not on file  Social History Narrative  . Not on file   Social Determinants of Health   Financial Resource Strain: Medium Risk  . Difficulty of Paying Living Expenses: Somewhat hard  Food Insecurity: Food Insecurity Present  . Worried About Charity fundraiser in the Last Year: Sometimes true  . Ran Out of Food in the Last Year: Sometimes true  Transportation Needs: Unmet Transportation Needs  . Lack of Transportation (Medical): No  . Lack of Transportation (Non-Medical): Yes  Physical Activity: Not on file  Stress: Not on file  Social Connections: Not on file  Intimate Partner Violence: Not on file     Family History  Problem Relation Age of Onset  . Leukemia Mother   . Clotting disorder Father        blood clot  . Hypertension Sister   . Diabetes Sister   . Stroke Sister 59  . Bleeding Disorder Son        ITP "free bleeding disorder"  . Colon cancer Paternal Grandmother   . Diabetes Paternal Grandmother   . Stomach cancer Neg Hx   . Pancreatic cancer Neg Hx     Vitals:   04/08/21 1124  BP: 98/66  Pulse: 66  SpO2: 98%  Weight: 91.6 kg (202 lb)   Wt Readings from Last 3 Encounters:  04/08/21 91.6 kg (202 lb)  04/01/21 87.1 kg (192 lb)  03/26/21 88 kg (194 lb)    PHYSICAL EXAM: General:  Well appearing. No resp difficulty HEENT: normal Neck: supple. no  JVD. Carotids 2+ bilat; no bruits. No lymphadenopathy or thryomegaly appreciated. Cor: PMI nondisplaced. Regular  rate & rhythm. No rubs, gallops or murmurs. Lungs: clear Abdomen: soft, nontender, nondistended. No hepatosplenomegaly. No bruits or masses. Good bowel sounds. Extremities: no cyanosis, clubbing, rash, edema Neuro: alert & orientedx3, cranial nerves grossly intact. moves all 4 extremities w/o difficulty. Affect pleasant   ASSESSMENT & PLAN:  1. Chronic systolic CHF due to iCM - Echo 10/20 EF 25-30% - Echo 05/2020 EF 40-45%  -Echo 02/2021 EF 30-35%  - NYHA II. Volume status stable.  No room to titrate meds with soft BP.  - Continue bisoprolol 5 mg daily.   Intolerant metoprolol & carvedilol due to headache.   - Continue  entresto 97-103 once she receives in the mail.  - Continue spironolactone 25 mg daily. - Continue hydralazine. Intolerant imdur due to migraine.  - Continue Farxiga 10 mg daily.  - I reviewed BMET from April, stable.    2. CAD S/P multiple PCIs - s/p PCI in [place] - s/p OM PCI '08-Dr Claiborne Billings - s/p LAD PCI/DES 2012 - s/p pLAD PCI with DES 06/19/2019- Dr Gwenlyn Found after patient turned down for CABG. Known occluded RCA with L-R collaterals, 65% mCFX-EF 25% - No chest pain.  - Continue ASA/statin. Started appeal for repatha.  - Managed by Dr. Gwenlyn Found.   3. HTN -BP soft.   4. COPD with ongoing tobacco use - Discussed smoking cessation.     5. OSA: - Intolerant CPAP.   6. PVCs - See Zio report. -  Not ideal amio candidate with lung disease.  7. Obesity Body mass index is 38.17 kg/m.   HF Paramedicine for medication management. Doing great with medications and has them all.   Follow up with Dr Haroldine Laws in 3 months.     Sharon Stapel  NP-C  04/08/21 11:40 AM

## 2021-04-07 NOTE — Telephone Encounter (Signed)
Spoke to Tracey Manning and reminded her of her appointment for tomorrow at [facility]. She has transportation set up with [facility] and will be at appointment. Call complete.

## 2021-04-08 ENCOUNTER — Other Ambulatory Visit: Payer: Self-pay

## 2021-04-08 ENCOUNTER — Ambulatory Visit (HOSPITAL_COMMUNITY)
Admission: RE | Admit: 2021-04-08 | Discharge: 2021-04-08 | Disposition: A | Discharge status: Home / Self Care | Payer: Medicaid Other | Source: Ambulatory Visit | Attending: Adult Health | Admitting: Adult Health

## 2021-04-08 ENCOUNTER — Encounter (HOSPITAL_COMMUNITY): Payer: Self-pay

## 2021-04-08 ENCOUNTER — Other Ambulatory Visit (HOSPITAL_COMMUNITY): Payer: Self-pay

## 2021-04-08 VITALS — BP 98/66 | HR 66 | Wt 202.0 lb

## 2021-04-08 DIAGNOSIS — Z8249 Family history of ischemic heart disease and other diseases of the circulatory system: Secondary | ICD-10-CM | POA: Insufficient documentation

## 2021-04-08 DIAGNOSIS — Z79899 Other long term (current) drug therapy: Secondary | ICD-10-CM | POA: Diagnosis not present

## 2021-04-08 DIAGNOSIS — I110 Hypertensive heart disease with heart failure: Secondary | ICD-10-CM | POA: Insufficient documentation

## 2021-04-08 DIAGNOSIS — I471 Supraventricular tachycardia: Secondary | ICD-10-CM | POA: Insufficient documentation

## 2021-04-08 DIAGNOSIS — Z7984 Long term (current) use of oral hypoglycemic drugs: Secondary | ICD-10-CM | POA: Insufficient documentation

## 2021-04-08 DIAGNOSIS — Z720 Tobacco use: Secondary | ICD-10-CM

## 2021-04-08 DIAGNOSIS — Z7982 Long term (current) use of aspirin: Secondary | ICD-10-CM | POA: Insufficient documentation

## 2021-04-08 DIAGNOSIS — I493 Ventricular premature depolarization: Secondary | ICD-10-CM | POA: Insufficient documentation

## 2021-04-08 DIAGNOSIS — G4733 Obstructive sleep apnea (adult) (pediatric): Secondary | ICD-10-CM | POA: Diagnosis not present

## 2021-04-08 DIAGNOSIS — I25110 Atherosclerotic heart disease of native coronary artery with unstable angina pectoris: Secondary | ICD-10-CM | POA: Diagnosis not present

## 2021-04-08 DIAGNOSIS — F17210 Nicotine dependence, cigarettes, uncomplicated: Secondary | ICD-10-CM | POA: Diagnosis not present

## 2021-04-08 DIAGNOSIS — I255 Ischemic cardiomyopathy: Secondary | ICD-10-CM | POA: Diagnosis not present

## 2021-04-08 DIAGNOSIS — J449 Chronic obstructive pulmonary disease, unspecified: Secondary | ICD-10-CM | POA: Insufficient documentation

## 2021-04-08 DIAGNOSIS — E119 Type 2 diabetes mellitus without complications: Secondary | ICD-10-CM | POA: Diagnosis not present

## 2021-04-08 DIAGNOSIS — I2510 Atherosclerotic heart disease of native coronary artery without angina pectoris: Secondary | ICD-10-CM

## 2021-04-08 DIAGNOSIS — Z6838 Body mass index (BMI) 38.0-38.9, adult: Secondary | ICD-10-CM | POA: Insufficient documentation

## 2021-04-08 DIAGNOSIS — I5022 Chronic systolic (congestive) heart failure: Secondary | ICD-10-CM | POA: Insufficient documentation

## 2021-04-08 DIAGNOSIS — Z955 Presence of coronary angioplasty implant and graft: Secondary | ICD-10-CM | POA: Insufficient documentation

## 2021-04-08 NOTE — Patient Instructions (Signed)
Your physician recommends that you schedule a follow-up appointment in: 3 months  If you have any questions or concerns before your next appointment please send us a message through mychart or call our office at [phone].    TO LEAVE A MESSAGE FOR THE NURSE SELECT OPTION 2, PLEASE LEAVE A MESSAGE INCLUDING: . YOUR NAME . DATE OF BIRTH . CALL BACK NUMBER . REASON FOR CALL**this is important as we prioritize the call backs  YOU WILL RECEIVE A CALL BACK THE SAME DAY AS LONG AS YOU CALL BEFORE 4:00 PM  At the [facility], you and your health needs are our priority. As part of our continuing mission to provide you with exceptional heart care, we have created designated Provider Care Teams. These Care Teams include your primary Cardiologist (physician) and Advanced Practice Providers (APPs- Physician Assistants and Nurse Practitioners) who all work together to provide you with the care you need, when you need it.   You may see any of the following providers on your designated Care Team at your next follow up: . Dr Daniel Bensimhon . Dr Dalton McLean . Dr Brandon Winfrey . [name] Clegg, NP . Brittainy Simmons, PA . Jessica Milford,NP . Lauren Kemp, PharmD   Please be sure to bring in all your medications bottles to every appointment.     

## 2021-04-08 NOTE — Telephone Encounter (Signed)
[employer] approved from Apr 02, 2021 - Apr 02, 2022

## 2021-04-08 NOTE — Progress Notes (Signed)
Paramedicine Encounter    Patient ID: Tracey Manning, female    DOB: May 07, 1961, 60 y.o.   MRN: [id number]   Met with [name] in the clinic today with [name], PA. [name] reports doing well and having no complaints at present. [name] did not make any medication changes today. [name] will follow up with Dr. Haroldine Laws in 3 months.   -Follow up with PCP on BP cuff.   Refills: Farxiga Potassium Pregabalin Spironolactone   I will see [name] in one week.      Future Appointments  Date Time Provider [place]  04/15/2021 11:30 AM Catheryn Bacon [facility]  04/23/2021 10:30 AM Gregor Hams, MD [facility] None     ACTION: Home visit completed Next visit planned for one week

## 2021-04-10 NOTE — Telephone Encounter (Signed)
Patient aware med approved She should contact pharmacy to obtain medication Scheduled lipid clinic follow up on 08/06/16 @ [facility] Appointment info/location info mailed

## 2021-04-14 ENCOUNTER — Other Ambulatory Visit (HOSPITAL_COMMUNITY): Payer: Self-pay

## 2021-04-15 ENCOUNTER — Other Ambulatory Visit (HOSPITAL_COMMUNITY): Payer: Self-pay

## 2021-04-15 ENCOUNTER — Ambulatory Visit: Payer: Medicaid Other | Attending: Family Medicine | Admitting: Physical Therapy

## 2021-04-15 ENCOUNTER — Other Ambulatory Visit: Payer: Self-pay

## 2021-04-15 ENCOUNTER — Encounter: Payer: Self-pay | Admitting: Physical Therapy

## 2021-04-15 DIAGNOSIS — M25511 Pain in right shoulder: Secondary | ICD-10-CM | POA: Diagnosis present

## 2021-04-15 DIAGNOSIS — M542 Cervicalgia: Secondary | ICD-10-CM | POA: Diagnosis present

## 2021-04-15 DIAGNOSIS — R2689 Other abnormalities of gait and mobility: Secondary | ICD-10-CM | POA: Insufficient documentation

## 2021-04-15 DIAGNOSIS — M25512 Pain in left shoulder: Secondary | ICD-10-CM | POA: Insufficient documentation

## 2021-04-15 DIAGNOSIS — M6281 Muscle weakness (generalized): Secondary | ICD-10-CM | POA: Insufficient documentation

## 2021-04-15 DIAGNOSIS — G8929 Other chronic pain: Secondary | ICD-10-CM | POA: Diagnosis present

## 2021-04-15 DIAGNOSIS — M545 Low back pain, unspecified: Secondary | ICD-10-CM | POA: Insufficient documentation

## 2021-04-15 NOTE — Therapy (Signed)
[place], [place], [address] Phone: [phone]   Fax:  [phone]  Physical Therapy Evaluation  Patient Details  Name: Tracey Manning MRN: [id number] Date of Birth: 01-02-1961 Referring Provider (PT): Gregor Hams, MD   Encounter Date: 04/15/2021   Past Medical History:  Diagnosis Date  . Anxiety   . Arthritis   . Asthma   . Barrett's esophagus   . Chest pain   . Collagen vascular disease ([place])   . Colon polyps   . Coronary artery disease   . Depression   . Dyslipidemia   . GERD (gastroesophageal reflux disease)    occ  . Hyperlipidemia   . Hypertension   . Myocardial infarct ([place]) [id number]  . Sciatica   . Sleep apnea    no CPAP ordered but using oxygen at bedtime  . Tobacco abuse     Past Surgical History:  Procedure Laterality Date  . CARDIAC CATHETERIZATION  10/09/2003   [facility] [place], [place]) - LAD with 30% prox narrowing, 50% stenosis in mid-portion of PLA; RCA with 40% narrowing proximally ([name], [name])  . CARDIAC CATHETERIZATION  10/10/2007   70% stenosis in first septal perforator branch of LAD, 60-70% narrowing in mid LAD, 20% narrowing in mid AV groove Cfx with 80% diffuse narrowing in small distal marginal, total occlusion of mid RCA with L to R collaterals, 90% stenosis diffusely in prox branch of RCA followed by 70% stenosis in secondary curve & 80% stenosis in small marginal branch ([name])  . CARDIAC CATHETERIZATION  05/28/2008   normal L main, RCA with 100% prox lesion w/distal filling from LAD collaterals, LAD with 20% prox tubular lesion/ 40% mid LAD lesion/previous stent patent ([name])  . CARDIAC CATHETERIZATION  09/15/2009   discrete 100% osital RCA lesion, 50% prox LAD lesion, non-obstructive disease in all coronaries ([name])  . CESAREAN SECTION    . COLONOSCOPY    . COLONOSCOPY W/ POLYPECTOMY    . CORONARY ANGIOPLASTY WITH  STENT PLACEMENT  10/31/2007   PCI of distal Cfx with 2.25x11mm Taxus Adam DES, 60% narrowing of mid LAD ([name])  . CORONARY ANGIOPLASTY WITH STENT PLACEMENT  06/11/2011   PCI of prox-mid LAD with 3x66mm DES Resolute ([name])  . CORONARY STENT INTERVENTION N/A 06/19/2019   Procedure: CORONARY STENT INTERVENTION;  Surgeon: [name], MD;  Location: [facility] [facility];  Service: Cardiovascular;  Laterality: N/A;  LAD  . DILATION AND CURETTAGE, DIAGNOSTIC / THERAPEUTIC    . DILITATION & CURRETTAGE/HYSTROSCOPY WITH HYDROTHERMAL ABLATION N/A 04/03/2016   Procedure: DILATATION & CURETTAGE/HYSTEROSCOPY WITH HYDROTHERMAL ABLATION;  Surgeon: [name], MD;  Location: [facility];  Service: Gynecology;  Laterality: N/A;  . NM MYOCAR PERF WALL MOTION  08/2009   persantine myoview - normal perfusion in all regions, perfusion defect in anterior region (breast attenuation), EF 52%, low risk scan  . RIGHT/LEFT HEART CATH AND CORONARY ANGIOGRAPHY N/A 06/15/2019   Procedure: RIGHT/LEFT HEART CATH AND CORONARY ANGIOGRAPHY;  Surgeon: Belva Crome, MD;  Location: [place] [facility];  Service: Cardiovascular;  Laterality: N/A;    There were no vitals filed for this visit.    Subjective Assessment - 04/15/21 1122    Subjective Pt reports chonic pain in bilateral shoulder, L UE to pinky, neck, low back, L leg (calf).  D/t time restraints this exam will focus on bilateral shoulder pain. MOI: chronic bilateral  shoulder ain (~1 year).   L started first.  Similar sxs in both arms.  insidious onset.  denies trauma.  Some n/t when sleeping (L arm).  This does not happen during the day.  Reports some feeling on weakness bilateral hands.  R handed.  she is havin some cracking and popping.  Follows no pattern throughout day  Aggs:  OH reaching, lifting, combing hair, clipping bra.  Eases: rest  Pain: 10/10 max, 10/10 currently, 0/10 max when resting on a hot day.    Pertinent History COPD, CHF    Limitations  Lifting;Standing;Walking;Writing;House hold activities;Sitting    How long can you sit comfortably? 1 hour    How long can you stand comfortably? 30 min    How long can you walk comfortably? 30 min    Patient Stated Goals reduce pain    Currently in Pain? Yes    Pain Score 10-Worst pain ever    Pain Location Shoulder    Pain Orientation Right;Left    Pain Descriptors / Indicators Aching;Sharp;Shooting    Pain Type Chronic pain    Pain Radiating Towards bilateral hands    Pain Onset More than a month ago    Pain Frequency Constant    Aggravating Factors  see subjective    Pain Relieving Factors see subjective    Effect of Pain on Daily Activities difficulty with iadls and self care              [facility] PT Assessment - 04/15/21 0001      Assessment   Medical Diagnosis Left shoulder pain, unspecified chronicity (M25.512), Chronic pain of both shoulders (M25.511, G89.29, M25.512), Lumbar pain (M54.50)    Referring Provider (PT) Gregor Hams, MD    Onset Date/Surgical Date 04/15/20    Hand Dominance Right    Next MD Visit june 1    Prior Therapy PT for heart      Precautions   Precaution Comments COPD, CHF      Restrictions   Other Position/Activity Restrictions none      Balance Screen   Has the patient fallen in the past 6 months No      [place] residence    Living Arrangements Alone    Available Help at Discharge Family;Friend(s)    Type of Home Apartment    Home Access Stairs to enter    Entrance Stairs-Number of Steps 2    Home Layout One level    Home Equipment None      Prior Function   Level of Independence Independent    Leisure walking      Observation/Other Assessments   Observations forward head, rounded shoulders; bilateral downward roatated scapula      Sensation   Light Touch Appears Intact      Functional Tests   Functional tests --   Gait speed: .76 m/s     AROM   Overall AROM Comments all Cx movements  are painful    Right Shoulder Flexion 90 Degrees   P!   Right Shoulder ABduction 95 Degrees   P!   Right Shoulder Internal Rotation --   scacrum   Right Shoulder External Rotation --   C7   Left Shoulder Flexion 90 Degrees   P!   Left Shoulder ABduction 65 Degrees   P!   Left Shoulder Internal Rotation --   sacrum P!   Left Shoulder External Rotation --   C7 P!   Cervical  Flexion 40    Cervical Extension 30    Cervical - Right Side Bend 30    Cervical - Left Side Bend 30    Cervical - Right Rotation 55    Cervical - Left Rotation 50      Strength   Overall Strength Comments all listed MMT limited by pain    Right Shoulder Flexion 3+/5   P!   Right Shoulder Internal Rotation 3+/5    Right Shoulder External Rotation 3+/5    Left Shoulder Flexion 3+/5   P!   Left Shoulder Internal Rotation 3+/5    Left Shoulder External Rotation 3+/5      Palpation   Palpation comment high level of diffuce tenderness bil shoulders, UT, cervical paraspinals, LS, periscpular region      Special Tests   Other special tests spurlings (-), cervical distraction (-)                      Objective measurements completed on examination: See above findings.               PT Education - 04/15/21 1253    Education Details POC, diagnosis, prognosis, HEP    Person(s) Educated Patient    Methods Explanation;Demonstration;Handout    Comprehension Verbalized understanding            PT Short Term Goals - 04/15/21 1323      PT SHORT TERM GOAL #1   Title Tracey Manning will be >75% HEP compliant within 3 weeks to facilitate carryover between sessions and move towards eventual independent management of their condition.    Time 3    Period Weeks    Status New    Target Date 05/06/21             PT Long Term Goals - 04/15/21 1324      PT LONG TERM GOAL #1   Title Tracey Manning will improve gait speed to 1 m/s (.1 m/s MCID) to show functional improvement in ambulation     Baseline .79 m/s    Time 3    Period Weeks    Status New    Target Date 07/08/21      PT LONG TERM GOAL #2   Title Tracey Manning will demonstrate >130 degrees of active ROM, not limited by pain, in abduction and flexion to allow completion of activities involving reaching [place] <90 degrees    Time 12    Period Weeks    Status New    Target Date 07/08/21      PT LONG TERM GOAL #3   Title Tracey Manning will be able to reach repeatedly to the height of second cabinet shelf, not limited by pain    Baseline unable    Time 12    Period Weeks    Status New    Target Date 07/08/21                  Plan - 04/15/21 1256    Clinical Impression Statement Tracey Manning is a 60 y.o. female who presents to clinic with signs and sxs consistent with bilateral shoulder pain and neck pain secondary to degenerative changes.  Likely rotator cuff involvement in shoulders.  No clear signs of cervical radiculopathy at this time.  Low back pain and lower quarter not evaluated d/t time constraints.  Pt presents with pain and notable deficits in: UE strength, shoulder ROM, and cervical ROM.  Pt is limited functionally in lifting, reaching, reading, sitting, standing, walking, and iADLs.  Pt will benefit from skilled therapy to address pain and the listed deficits in order to achieve functional goals, enable safety and independence in completion of daily tasks, and return to PLOF.    Personal Factors and Comorbidities Age;Fitness;Behavior Pattern;Comorbidity 2    Comorbidities COPD, CHF    Examination-Activity Limitations Bathing;Lift;Stand;Reach Overhead;Bend;Dressing;Squat;Sleep;Stairs;Locomotion Level;Transfers    Examination-Participation Restrictions Church;Cleaning;Laundry;Shop    Stability/Clinical Decision Making Evolving/Moderate complexity    Clinical Decision Making Moderate    Rehab Potential Fair    PT Frequency 2x / week    PT Duration 12 weeks    PT  Treatment/Interventions ADLs/Self Care Home Management;Aquatic Therapy;Iontophoresis 4mg /ml Dexamethasone;Joint Manipulations;Spinal Manipulations;Gait training;Neuromuscular re-education;Therapeutic activities;Therapeutic exercise;Manual techniques    PT Next Visit Plan review and add to HEP, evalute low back and lower quarter    PT Home Exercise Plan [facility]    Consulted and Agree with Plan of Care Patient           Patient will benefit from skilled therapeutic intervention in order to improve the following deficits and impairments:  Abnormal gait,Impaired sensation,Pain,Decreased activity tolerance,Decreased endurance,Decreased range of motion,Hypomobility,Impaired UE functional use,Difficulty walking  Visit Diagnosis: Cervicalgia  Chronic low back pain, unspecified back pain laterality, unspecified whether sciatica present  Chronic left shoulder pain  Right shoulder pain, unspecified chronicity  Other abnormalities of gait and mobility  Muscle weakness     Problem List Patient Active Problem List   Diagnosis Date Noted  . Acute on chronic combined systolic (congestive) and diastolic (congestive) heart failure ([place]) 03/10/2021  . Acute respiratory failure with hypoxia ([place]) 02/23/2021  . Impingement syndrome of left shoulder 03/12/2020  . Chronic pain 07/18/2019  . CRI (chronic renal insufficiency), stage 3 (moderate) 07/18/2019  . COPD (chronic obstructive pulmonary disease) ([place]) 06/28/2019  . Obesity (BMI 30-39.9) 06/28/2019  . Ischemic cardiomyopathy 06/28/2019  . Dyspnea on exertion 06/15/2019  . Acute systolic CHF (congestive heart failure) ([place])   . AKI (acute kidney injury) ([place])   . Abnormal uterine bleeding (AUB) 04/03/2016  . Dysfunctional uterine bleeding 11/14/2015  . Bacterial vaginosis (recurrent) 11/14/2015  . Atypical chest pain 11/14/2015  . Essential hypertension 04/04/2014  . Dyslipidemia, goal LDL below 70 04/04/2014  . CAD S/P multiple PCIs  04/04/2014    Shearon Balo PT, DPT 04/15/21 1:33 PM  [facility] [address] [place], [place], [address] Phone: [phone]   Fax:  [phone]  Name: Tracey Manning MRN: [id number] Date of Birth: 09/26/1961

## 2021-04-15 NOTE — Addendum Note (Signed)
Addended by: Mathis Dad on: 04/15/2021 01:41 PM   Modules accepted: Orders

## 2021-04-15 NOTE — Patient Instructions (Signed)
Access Code: [facility] URL: [email or web address] Date: 04/15/2021 Prepared by: Shearon Balo  Exercises Gentle Levator Scapulae Stretch - 3 x daily - 7 x weekly - 1 sets - 1 reps - 45 hold

## 2021-04-16 ENCOUNTER — Other Ambulatory Visit (HOSPITAL_COMMUNITY): Payer: Self-pay

## 2021-04-16 MED FILL — Ergocalciferol Cap 1.25 MG (50000 Unit): ORAL | 28 days supply | Qty: 4 | Fill #1 | Status: AC

## 2021-04-16 MED FILL — Evolocumab Subcutaneous Soln Auto-Injector 140 MG/ML: SUBCUTANEOUS | 28 days supply | Qty: 2 | Fill #0 | Status: AC

## 2021-04-18 ENCOUNTER — Other Ambulatory Visit (HOSPITAL_COMMUNITY): Payer: Self-pay

## 2021-04-21 ENCOUNTER — Other Ambulatory Visit (HOSPITAL_COMMUNITY): Payer: Self-pay

## 2021-04-21 NOTE — Progress Notes (Signed)
Paramedicine Encounter    Patient ID: Tracey Manning, female    DOB: March 20, 1961, 60 y.o.   MRN: [id number]   Patient Care Team: Nolene Ebbs, MD as PCP - General (Internal Medicine) [name], MD as PCP - Cardiology (Cardiology) Bensimhon, Shaune Pascal, MD as PCP - Advanced Heart Failure (Cardiology) Jorge Ny, LCSW as Social Worker (Licensed Clinical Social Worker)  Patient Active Problem List   Diagnosis Date Noted  . Acute on chronic combined systolic (congestive) and diastolic (congestive) heart failure ([place]) 03/10/2021  . Acute respiratory failure with hypoxia ([place]) 02/23/2021  . Impingement syndrome of left shoulder 03/12/2020  . Chronic pain 07/18/2019  . CRI (chronic renal insufficiency), stage 3 (moderate) 07/18/2019  . COPD (chronic obstructive pulmonary disease) ([place]) 06/28/2019  . Obesity (BMI 30-39.9) 06/28/2019  . Ischemic cardiomyopathy 06/28/2019  . Dyspnea on exertion 06/15/2019  . Acute systolic CHF (congestive heart failure) ([place])   . AKI (acute kidney injury) ([place])   . Abnormal uterine bleeding (AUB) 04/03/2016  . Dysfunctional uterine bleeding 11/14/2015  . Bacterial vaginosis (recurrent) 11/14/2015  . Atypical chest pain 11/14/2015  . Essential hypertension 04/04/2014  . Dyslipidemia, goal LDL below 70 04/04/2014  . CAD S/P multiple PCIs 04/04/2014    Current Outpatient Medications:  .  acetaminophen (TYLENOL) 500 MG tablet, TAKE 2 TABLETS BY MOUTH EVERY 6 HOURS AS NEEDED, Disp: 100 tablet, Rfl: 0 .  albuterol (PROVENTIL) (2.5 MG/3ML) 0.083% nebulizer solution, INHALE 1 VIAL VIA NEBULIZER EVERY 6 HOURS AS NEEDED (Patient taking differently: Take 2.5 mg by nebulization every 6 (six) hours as needed for wheezing or shortness of breath.), Disp: 90 mL, Rfl: 11 .  albuterol (VENTOLIN HFA) 108 (90 Base) MCG/ACT inhaler, INHALE 2 PUFFS EVERY 4 HOURS AS NEEDED (Patient taking differently: Inhale 2 puffs into the lungs every 4 (four) hours as needed for  shortness of breath or wheezing.), Disp: 8.5 g, Rfl: 5 .  aspirin 81 MG EC tablet, Take 1 tablet (81 mg total) by mouth daily., Disp: 90 tablet, Rfl: 3 .  atorvastatin (LIPITOR) 80 MG tablet, TAKE 1 TABLET BY MOUTH ONCE A DAY, Disp: 90 tablet, Rfl: 3 .  benzonatate (TESSALON) 100 MG capsule, Take 1 capsule (100 mg total) by mouth 2 (two) times daily. (Patient taking differently: Take 100 mg by mouth 2 (two) times daily as needed for cough.), Disp: 20 capsule, Rfl: 0 .  bisoprolol (ZEBETA) 5 MG tablet, Take 1 tablet (5 mg total) by mouth daily., Disp: 30 tablet, Rfl: 3 .  cetirizine (ZYRTEC) 10 MG tablet, TAKE 1 TABLET BY MOUTH ONCE A DAY AS NEEDED (Patient taking differently: Take 10 mg by mouth daily as needed for allergies.), Disp: 90 tablet, Rfl: 2 .  clopidogrel (PLAVIX) 75 MG tablet, Take 1 tablet (75 mg total) by mouth daily., Disp: 90 tablet, Rfl: 2 .  clotrimazole-betamethasone (LOTRISONE) cream, Apply 2 times a day as needed, Disp: 30 g, Rfl: 2 .  dapagliflozin propanediol (FARXIGA) 10 MG TABS tablet, TAKE 1 TABLET BY MOUTH DAILY BEFORE BREAKFAST, Disp: 90 tablet, Rfl: 3 .  diclofenac Sodium (VOLTAREN) 1 % GEL, APPLY 4 GRAMS FOUR TIMES DAILY AS NEEDED FOR PAINS AS DIRECTED, Disp: 500 g, Rfl: 5 .  Evolocumab 140 MG/ML SOAJ, INJECT 1 DOSE INTO THE SKIN EVERY 14 (FOURTEEN) DAYS., Disp: 2 mL, Rfl: 11 .  Fluticasone-Umeclidin-Vilant (TRELEGY ELLIPTA) 200-62.5-25 MCG/INH AEPB, Inhale 1 puff into the lungs daily as needed (wheezing/sob)., Disp: , Rfl:  .  furosemide (LASIX) 40  MG tablet, TAKE 1 TABLET BY MOUTH DAILY., Disp: 90 tablet, Rfl: 3 .  hydrOXYzine (ATARAX/VISTARIL) 25 MG tablet, Take 1 tablet (25 mg total) by mouth 3 (three) times daily as needed for anxiety, Disp: 90 tablet, Rfl: 2 .  nitroGLYCERIN (NITROSTAT) 0.4 MG SL tablet, DISSOLVE 1 TABLET UNDER THE TONGUE AS NEEDED AS DIRECTED, Disp: 25 tablet, Rfl: 5 .  ofloxacin (OCUFLOX) 0.3 % ophthalmic solution, INSTILL 1 DROP 3 TIMES DAILY IN  OPERATIVE EYE STARTING 2 DAYS PRIOR TO SURGERY AND AFTER SURGERY FOR 3 WEEKS. (Patient taking differently: Place 1 drop into the left eye See admin instructions. Instill 1 drop three times daily in operative eye starting 2 days prior to surgery and after surgery for 3 weeks), Disp: 5 mL, Rfl: 1 .  potassium chloride (KLOR-CON) 10 MEQ tablet, Take 2 tablets (20 mEq total) by mouth 2 (two) times daily., Disp: 120 tablet, Rfl: 11 .  prednisoLONE acetate (PRED FORTE) 1 % ophthalmic suspension, APPLY ONE DROP IN OPERATIVE EYE(S) 3 TIMES DAILY STARTING 2 DAYS BEFORE SURGERY AND AFTER SURGERY FOR 3 WEEKS (Patient taking differently: Place 1 drop into the left eye See admin instructions. Apply one drop in operative eye three times daily starting 2 days before surgery and after surgery for 3 weeks), Disp: 5 mL, Rfl: 1 .  pregabalin (LYRICA) 75 MG capsule, Take 1 capsule (75 mg total) by mouth 2 (two) times daily as needed (nerve pain)., Disp: 60 capsule, Rfl: 3 .  sacubitril-valsartan (ENTRESTO) 97-103 MG, Take 1 tablet by mouth 2 (two) times daily., Disp: 180 tablet, Rfl: 3 .  spironolactone (ALDACTONE) 25 MG tablet, TAKE 1 TABLET BY MOUTH DAILY, Disp: 30 tablet, Rfl: 3 .  Vitamin D, Ergocalciferol, (DRISDOL) 1.25 MG (50000 UNIT) CAPS capsule, TAKE 1 CAPSULE BY MOUTH ONCE A WEEK (Patient taking differently: Take 50,000 Units by mouth once a week.), Disp: 4 capsule, Rfl: 5 .  zolpidem (AMBIEN) 10 MG tablet, Take 1 tablet (10 mg total) by mouth at bedtime as needed for sleep, Disp: 30 tablet, Rfl: 0 Allergies  Allergen Reactions  . Coreg [Carvedilol] Other (See Comments)    Headaches  . Strawberry Extract Anaphylaxis, Hives, Swelling and Other (See Comments)    "Fresh strawberries" made the throat swell (2014)  . Imdur [Isosorbide Nitrate] Other (See Comments)    Headaches   . Amoxicillin Nausea And Vomiting    Did it involve swelling of the face/tongue/throat, SOB, or low BP? Yes Did it involve sudden or  severe rash/hives, skin peeling, or any reaction on the inside of your mouth or nose? Yes Did you need to seek medical attention at a hospital or doctor's office? Yes When did it last happen?within last 10 years If all above answers are "NO", may proceed with cephalosporin use.   . Dilaudid [Hydromorphone Hcl] Nausea And Vomiting  . Ibuprofen Other (See Comments)    Wheezing   . Latex Swelling and Other (See Comments)    No reaction with Band-Aids, though  . Metoprolol Other (See Comments)    Headaches and wheezing     Social History   Socioeconomic History  . Marital status: Single    Spouse name: Not on file  . Number of children: 2  . Years of education: 18  . Highest education level: High school graduate  Occupational History  . Not on file  Tobacco Use  . Smoking status: Current Every Day Smoker    Packs/day: 0.50    Years: 34.00  Pack years: 17.00    Types: Cigarettes  . Smokeless tobacco: Never Used  Vaping Use  . Vaping Use: Never used  Substance and Sexual Activity  . Alcohol use: No    Alcohol/week: 0.0 standard drinks    Comment: seldom  . Drug use: Yes    Types: Marijuana    Comment: 06/19/19  . Sexual activity: Yes    Partners: Male    Birth control/protection: Post-menopausal  Other Topics Concern  . Not on file  Social History Narrative  . Not on file   Social Determinants of Health   Financial Resource Strain: Medium Risk  . Difficulty of Paying Living Expenses: Somewhat hard  Food Insecurity: Food Insecurity Present  . Worried About Charity fundraiser in the Last Year: Sometimes true  . Ran Out of Food in the Last Year: Sometimes true  Transportation Needs: Unmet Transportation Needs  . Lack of Transportation (Medical): No  . Lack of Transportation (Non-Medical): Yes  Physical Activity: Not on file  Stress: Not on file  Social Connections: Not on file  Intimate Partner Violence: Not on file    Physical Exam Vitals reviewed.   Constitutional:      Appearance: Normal appearance. She is normal weight.  HENT:     Head: Normocephalic.     Nose: Nose normal.     Mouth/Throat:     Mouth: Mucous membranes are moist.     Pharynx: Oropharynx is clear.  Eyes:     Conjunctiva/sclera: Conjunctivae normal.     Pupils: Pupils are equal, round, and reactive to light.  Cardiovascular:     Rate and Rhythm: Normal rate and regular rhythm.     Pulses: Normal pulses.     Heart sounds: Normal heart sounds.  Pulmonary:     Effort: Pulmonary effort is normal.     Breath sounds: Normal breath sounds.  Abdominal:     General: Abdomen is flat.     Palpations: Abdomen is soft.  Musculoskeletal:        General: Normal range of motion.     Cervical back: Normal range of motion.     Right lower leg: No edema.     Left lower leg: No edema.  Skin:    General: Skin is warm and dry.     Capillary Refill: Capillary refill takes less than 2 seconds.  Neurological:     General: No focal deficit present.     Mental Status: She is alert. Mental status is at baseline.  Psychiatric:        Mood and Affect: Mood normal.     Arrived for home visit for Tracey Manning today who was alert and oriented reporting to be feeling okay but very emotional and depressed today. Amel and I spoke for over 20 minuets discussing her feelings and after the conversation she reports feeling much better and was grateful for same. I obtained assessment and it is as noted. No swelling noted, slight weight gain however Damian admitted eating hotdogs and hamburgers over the weekend. I reviewed medications with Tracey Manning and filled pill box accordingly. Tracey Manning denied shortness of breath, dizziness, chest pain or trouble taking medications. Vitals are as noted.   Tracey Manning reports she is unhappy with using [facility] and would like to be transferred to [employer]. I will assist in completing this task.   Tracey Manning received her first dose of Repatha today at 12.    Home visit complete. I will be back out in  one week.   Refills: Bisoprolol Tessalon  Ambien      Future Appointments  Date Time Provider [place]  04/23/2021 10:30 AM Gregor Hams, MD [facility] None  06/26/2021  1:40 PM Bensimhon, Shaune Pascal, MD [facility] None  08/06/2021 11:15 AM Hilty, Nadean Corwin, MD [facility]     ACTION: Home visit completed Next visit planned for one week

## 2021-04-22 NOTE — Progress Notes (Signed)
I, Tracey Manning, LAT, ATC, am serving as scribe for Dr. [name].  Tracey Manning is a 60 y.o. female who presents to [place] at [facility] today for f/u of L shoulder pain and B leg pain.  She was last seen by Dr.Regie Bunner on 03/20/21 and was referred to PT of which she's completed 1 session.  She was also prescribed prednisone and Lyrica and was referred for a LE vascular doppler US that she had on 03/27/21.  Since her last visit, pt reports that her L shoulder pain remains the same.  In addition, pt states that now her R shoulder is also hurting w/ pain radiating from her R axilla into her R hand.  She is also having some neck pain.  She has finished the prednisone and con't to take the Lyrica but doesn't feel that either helped her symptoms.  Diagnostic imaging: B LE doppler US- 03/27/21;  C-spine, L-spine, R and L shoulder XR- 03/20/21  Pertinent review of systems: No fevers or chills  Relevant historical information: Hypertension, COPD   Exam:  BP 106/70 (BP Location: Right Arm, Patient Position: Sitting, Cuff Size: Normal)   Pulse (!) 55   Ht 5\' 1"  (1.549 m)   Wt 201 lb (91.2 kg)   LMP  (LMP Unknown)   SpO2 98%   BMI 37.98 kg/m  General: Well Developed, well nourished, and in no acute distress.   MSK:  C-spine normal.  Nontender cervical midline decreased cervical motion. Upper extremity strength decreased strength shoulder abduction 4/5 bilaterally. Otherwise upper extremity strength is intact. Reflexes are intact.  L-spine nontender midline.  Decreased lumbar motion. Lower extremity strength is intact Reflexes are intact but Sensation is reduced. Antalgic gait.   Lab and Radiology Results   EXAM: CERVICAL SPINE - 2-3 VIEW  COMPARISON:  CT cervical spine dated January 16, 2019.  FINDINGS: The lateral view is diagnostic to the C7-T1 level.  There is no acute fracture or subluxation. Vertebral body heights are preserved.  Unchanged  reversal of the normal cervical lordosis. No significant listhesis. Normal C1-C2 alignment.  Unchanged mild disc height loss with endplate spurring from V2-Z3 through C6-C7. Unchanged severe left-sided facet arthropathy at C2-C3.  Normal prevertebral soft tissues.  IMPRESSION: 1. Unchanged mild cervical degenerative disc disease. 2. Unchanged severe left facet arthropathy at C2-C3, which can be a source of neck pain.   Electronically Signed   By: Titus Dubin M.D.   On: 03/21/2021 14:12  EXAM: LUMBAR SPINE - 2-3 VIEW  COMPARISON:  CT lumbar myelogram dated January 26, 2018.  FINDINGS: Five lumbar type vertebral bodies.  No acute fracture or subluxation. Vertebral body heights are preserved.  Unchanged facet mediated 5 mm anterolisthesis at L4-L5.  Unchanged mild L4-L5 and moderate L5-S1 disc height loss. Unchanged severe lumbar facet arthropathy from L3-L4 through L5-S1.  The sacroiliac joints are unremarkable. New round 1.4 cm radiopaque density to the left of the L3-L4 disc space on the frontal view, presumably an ingested pill.  IMPRESSION: 1. Unchanged mild L4-L5 and moderate L5-S1 degenerative disc disease. 2. Unchanged severe lower lumbar facet arthropathy with grade 1 anterolisthesis at L4-L5.   Electronically Signed   By: Titus Dubin M.D.   On: 03/21/2021 14:09 I, [name], personally (independently) visualized and performed the interpretation of the images attached in this note.  ABI dated Mar 27, 2021  Summary:  Right: Resting right ankle-brachial index is within normal range. No  evidence of significant right lower  extremity arterial disease. The right  toe-brachial index is normal.   Left: Resting left ankle-brachial index is within normal range. No  evidence of significant left lower extremity arterial disease. The left  toe-brachial index is normal.     Electronically signed by Larae Grooms MD on 03/27/2021 at 8:45:57  PM.     Assessment and Plan: 60 y.o. female with  Cervical radiculopathy and weakness.  Failing conservative management and.  Plan for cervical MRI to plan for epidural steroid injection.  Patient additionally does have some progressive neurologic symptoms with any weakness.  Lumbar radiculopathy with neurogenic claudication.  Originally I was fearful for claudication however ABI in early May was normal.  Symptoms are significant and obnoxious.  Patient is also failing conservative management.  Plan for lumbar MRI from again for epidural steroid injection.  Prescribe Ativan for MRI claustrophobia.  Recheck after MRI.   PDMP not reviewed this encounter. Orders Placed This Encounter  Procedures  . MR CERVICAL SPINE WO CONTRAST    Standing Status:   Future    Standing Expiration Date:   04/23/2022    Order Specific Question:   What is the patient's sedation requirement?    Answer:   No Sedation    Order Specific Question:   Does the patient have a pacemaker or implanted devices?    Answer:   No    Order Specific Question:   Preferred imaging location?    Answer:   [facility] (table limit-550lbs)  . MR Lumbar Spine Wo Contrast    Standing Status:   Future    Standing Expiration Date:   04/23/2022    Order Specific Question:   What is the patient's sedation requirement?    Answer:   No Sedation    Order Specific Question:   Does the patient have a pacemaker or implanted devices?    Answer:   No    Order Specific Question:   Preferred imaging location?    Answer:   [facility] (table limit-550lbs)   Meds ordered this encounter  Medications  . LORazepam (ATIVAN) 0.5 MG tablet    Sig: Take 1-2 tablets by mouth 30 - 60 min prior to MRI. Do not drive with this medicine.    Dispense:  4 tablet    Refill:  0     Discussed warning signs or symptoms. Please see discharge instructions. Patient expresses understanding.   The above documentation has been reviewed and is  accurate and complete [name], M.D.

## 2021-04-23 ENCOUNTER — Other Ambulatory Visit: Payer: Self-pay

## 2021-04-23 ENCOUNTER — Ambulatory Visit (INDEPENDENT_AMBULATORY_CARE_PROVIDER_SITE_OTHER): Payer: Medicaid Other | Admitting: Family Medicine

## 2021-04-23 ENCOUNTER — Encounter: Payer: Self-pay | Admitting: Family Medicine

## 2021-04-23 ENCOUNTER — Other Ambulatory Visit (HOSPITAL_COMMUNITY): Payer: Self-pay

## 2021-04-23 VITALS — BP 106/70 | HR 55 | Ht 61.0 in | Wt 201.0 lb

## 2021-04-23 DIAGNOSIS — M48062 Spinal stenosis, lumbar region with neurogenic claudication: Secondary | ICD-10-CM | POA: Diagnosis not present

## 2021-04-23 DIAGNOSIS — M5412 Radiculopathy, cervical region: Secondary | ICD-10-CM | POA: Diagnosis not present

## 2021-04-23 DIAGNOSIS — M5416 Radiculopathy, lumbar region: Secondary | ICD-10-CM

## 2021-04-23 MED ORDER — LORAZEPAM 0.5 MG PO TABS
ORAL_TABLET | ORAL | 0 refills | Status: DC
  Filled 2021-04-23: qty 4, 2d supply, fill #0

## 2021-04-23 NOTE — Patient Instructions (Signed)
Thank you for coming in today.  You should hear from MRI scheduling within 1 week. If you do not hear please let me know.

## 2021-04-24 ENCOUNTER — Other Ambulatory Visit (HOSPITAL_COMMUNITY): Payer: Self-pay | Admitting: *Deleted

## 2021-04-24 MED ORDER — SPIRONOLACTONE 25 MG PO TABS: ORAL_TABLET | Freq: Every day | ORAL | 3 refills | Status: DC

## 2021-04-24 MED ORDER — POTASSIUM CHLORIDE CRYS ER 10 MEQ PO TBCR: 20.0000 meq | EXTENDED_RELEASE_TABLET | Freq: Two times a day (BID) | ORAL | 11 refills | Status: DC

## 2021-04-24 MED ORDER — ENTRESTO 97-103 MG PO TABS: 1.0000 | ORAL_TABLET | Freq: Two times a day (BID) | ORAL | 3 refills | Status: DC

## 2021-04-24 MED ORDER — BISOPROLOL FUMARATE 5 MG PO TABS: 5.0000 mg | ORAL_TABLET | Freq: Every day | ORAL | 3 refills | Status: DC

## 2021-04-24 MED ORDER — CLOPIDOGREL BISULFATE 75 MG PO TABS: 75.0000 mg | ORAL_TABLET | Freq: Every day | ORAL | 2 refills | Status: DC

## 2021-04-24 MED ORDER — DAPAGLIFLOZIN PROPANEDIOL 10 MG PO TABS: ORAL_TABLET | Freq: Every day | ORAL | 3 refills | Status: DC

## 2021-04-24 MED ORDER — FUROSEMIDE 40 MG PO TABS: ORAL_TABLET | Freq: Every day | ORAL | 3 refills | Status: DC

## 2021-04-26 ENCOUNTER — Other Ambulatory Visit (HOSPITAL_COMMUNITY): Payer: Self-pay

## 2021-04-28 ENCOUNTER — Other Ambulatory Visit (HOSPITAL_COMMUNITY): Payer: Self-pay

## 2021-04-28 NOTE — Progress Notes (Signed)
Paramedicine Encounter    Patient ID: Tracey Manning, female    DOB: January 20, 1961, 60 y.o.   MRN: [id number]   Patient Care Team: Nolene Ebbs, MD as PCP - General (Internal Medicine) [name], MD as PCP - Cardiology (Cardiology) Bensimhon, Shaune Pascal, MD as PCP - Advanced Heart Failure (Cardiology) Jorge Ny, LCSW as Social Worker (Licensed Clinical Social Worker)  Patient Active Problem List   Diagnosis Date Noted  . Acute on chronic combined systolic (congestive) and diastolic (congestive) heart failure ([place]) 03/10/2021  . Acute respiratory failure with hypoxia ([place]) 02/23/2021  . Impingement syndrome of left shoulder 03/12/2020  . Chronic pain 07/18/2019  . CRI (chronic renal insufficiency), stage 3 (moderate) 07/18/2019  . COPD (chronic obstructive pulmonary disease) ([place]) 06/28/2019  . Obesity (BMI 30-39.9) 06/28/2019  . Ischemic cardiomyopathy 06/28/2019  . Dyspnea on exertion 06/15/2019  . Acute systolic CHF (congestive heart failure) ([place])   . AKI (acute kidney injury) ([place])   . Abnormal uterine bleeding (AUB) 04/03/2016  . Dysfunctional uterine bleeding 11/14/2015  . Bacterial vaginosis (recurrent) 11/14/2015  . Atypical chest pain 11/14/2015  . Essential hypertension 04/04/2014  . Dyslipidemia, goal LDL below 70 04/04/2014  . CAD S/P multiple PCIs 04/04/2014    Current Outpatient Medications:  .  acetaminophen (TYLENOL) 500 MG tablet, TAKE 2 TABLETS BY MOUTH EVERY 6 HOURS AS NEEDED, Disp: 100 tablet, Rfl: 0 .  albuterol (PROVENTIL) (2.5 MG/3ML) 0.083% nebulizer solution, INHALE 1 VIAL VIA NEBULIZER EVERY 6 HOURS AS NEEDED (Patient taking differently: Take 2.5 mg by nebulization every 6 (six) hours as needed for wheezing or shortness of breath.), Disp: 90 mL, Rfl: 11 .  albuterol (VENTOLIN HFA) 108 (90 Base) MCG/ACT inhaler, INHALE 2 PUFFS EVERY 4 HOURS AS NEEDED (Patient taking differently: Inhale 2 puffs into the lungs every 4 (four) hours as needed for  shortness of breath or wheezing.), Disp: 8.5 g, Rfl: 5 .  aspirin 81 MG EC tablet, Take 1 tablet (81 mg total) by mouth daily., Disp: 90 tablet, Rfl: 3 .  atorvastatin (LIPITOR) 80 MG tablet, TAKE 1 TABLET BY MOUTH ONCE A DAY, Disp: 90 tablet, Rfl: 3 .  benzonatate (TESSALON) 100 MG capsule, Take 1 capsule (100 mg total) by mouth 2 (two) times daily. (Patient taking differently: Take 100 mg by mouth 2 (two) times daily as needed for cough.), Disp: 20 capsule, Rfl: 0 .  bisoprolol (ZEBETA) 5 MG tablet, Take 1 tablet (5 mg total) by mouth daily., Disp: 30 tablet, Rfl: 3 .  cetirizine (ZYRTEC) 10 MG tablet, TAKE 1 TABLET BY MOUTH ONCE A DAY AS NEEDED (Patient taking differently: Take 10 mg by mouth daily as needed for allergies.), Disp: 90 tablet, Rfl: 2 .  clopidogrel (PLAVIX) 75 MG tablet, Take 1 tablet (75 mg total) by mouth daily., Disp: 90 tablet, Rfl: 2 .  clotrimazole-betamethasone (LOTRISONE) cream, Apply 2 times a day as needed, Disp: 30 g, Rfl: 2 .  dapagliflozin propanediol (FARXIGA) 10 MG TABS tablet, TAKE 1 TABLET BY MOUTH DAILY BEFORE BREAKFAST, Disp: 90 tablet, Rfl: 3 .  diclofenac Sodium (VOLTAREN) 1 % GEL, APPLY 4 GRAMS FOUR TIMES DAILY AS NEEDED FOR PAINS AS DIRECTED, Disp: 500 g, Rfl: 5 .  Evolocumab 140 MG/ML SOAJ, INJECT 1 DOSE INTO THE SKIN EVERY 14 (FOURTEEN) DAYS., Disp: 2 mL, Rfl: 11 .  Fluticasone-Umeclidin-Vilant (TRELEGY ELLIPTA) 200-62.5-25 MCG/INH AEPB, Inhale 1 puff into the lungs daily as needed (wheezing/sob)., Disp: , Rfl:  .  furosemide (LASIX) 40  MG tablet, TAKE 1 TABLET BY MOUTH DAILY., Disp: 90 tablet, Rfl: 3 .  hydrOXYzine (ATARAX/VISTARIL) 25 MG tablet, Take 1 tablet (25 mg total) by mouth 3 (three) times daily as needed for anxiety, Disp: 90 tablet, Rfl: 2 .  LORazepam (ATIVAN) 0.5 MG tablet, Take 1-2 tablets by mouth 30 - 60 min prior to MRI. Do not drive with this medicine., Disp: 4 tablet, Rfl: 0 .  nitroGLYCERIN (NITROSTAT) 0.4 MG SL tablet, DISSOLVE 1 TABLET  UNDER THE TONGUE AS NEEDED AS DIRECTED, Disp: 25 tablet, Rfl: 5 .  ofloxacin (OCUFLOX) 0.3 % ophthalmic solution, INSTILL 1 DROP 3 TIMES DAILY IN OPERATIVE EYE STARTING 2 DAYS PRIOR TO SURGERY AND AFTER SURGERY FOR 3 WEEKS., Disp: 5 mL, Rfl: 1 .  potassium chloride (KLOR-CON) 10 MEQ tablet, Take 2 tablets (20 mEq total) by mouth 2 (two) times daily., Disp: 120 tablet, Rfl: 11 .  prednisoLONE acetate (PRED FORTE) 1 % ophthalmic suspension, APPLY ONE DROP IN OPERATIVE EYE(S) 3 TIMES DAILY STARTING 2 DAYS BEFORE SURGERY AND AFTER SURGERY FOR 3 WEEKS (Patient taking differently: Place 1 drop into the left eye See admin instructions. Apply one drop in operative eye three times daily starting 2 days before surgery and after surgery for 3 weeks), Disp: 5 mL, Rfl: 1 .  pregabalin (LYRICA) 75 MG capsule, Take 1 capsule (75 mg total) by mouth 2 (two) times daily as needed (nerve pain)., Disp: 60 capsule, Rfl: 3 .  sacubitril-valsartan (ENTRESTO) 97-103 MG, Take 1 tablet by mouth 2 (two) times daily., Disp: 180 tablet, Rfl: 3 .  spironolactone (ALDACTONE) 25 MG tablet, TAKE 1 TABLET BY MOUTH DAILY, Disp: 30 tablet, Rfl: 3 .  Vitamin D, Ergocalciferol, (DRISDOL) 1.25 MG (50000 UNIT) CAPS capsule, TAKE 1 CAPSULE BY MOUTH ONCE A WEEK (Patient taking differently: Take 50,000 Units by mouth once a week.), Disp: 4 capsule, Rfl: 5 .  zolpidem (AMBIEN) 10 MG tablet, Take 1 tablet (10 mg total) by mouth at bedtime as needed for sleep, Disp: 30 tablet, Rfl: 0 Allergies  Allergen Reactions  . Coreg [Carvedilol] Other (See Comments)    Headaches  . Strawberry Extract Anaphylaxis, Hives, Swelling and Other (See Comments)    "Fresh strawberries" made the throat swell (2014)  . Imdur [Isosorbide Nitrate] Other (See Comments)    Headaches   . Amoxicillin Nausea And Vomiting    Did it involve swelling of the face/tongue/throat, SOB, or low BP? Yes Did it involve sudden or severe rash/hives, skin peeling, or any reaction on  the inside of your mouth or nose? Yes Did you need to seek medical attention at a hospital or doctor's office? Yes When did it last happen?within last 10 years If all above answers are "NO", may proceed with cephalosporin use.   . Dilaudid [Hydromorphone Hcl] Nausea And Vomiting  . Ibuprofen Other (See Comments)    Wheezing   . Latex Swelling and Other (See Comments)    No reaction with Band-Aids, though  . Metoprolol Other (See Comments)    Headaches and wheezing     Social History   Socioeconomic History  . Marital status: Single    Spouse name: Not on file  . Number of children: 2  . Years of education: 22  . Highest education level: High school graduate  Occupational History  . Not on file  Tobacco Use  . Smoking status: Current Every Day Smoker    Packs/day: 0.50    Years: 34.00    Pack years:  17.00    Types: Cigarettes  . Smokeless tobacco: Never Used  Vaping Use  . Vaping Use: Never used  Substance and Sexual Activity  . Alcohol use: No    Alcohol/week: 0.0 standard drinks    Comment: seldom  . Drug use: Yes    Types: Marijuana    Comment: 06/19/19  . Sexual activity: Yes    Partners: Male    Birth control/protection: Post-menopausal  Other Topics Concern  . Not on file  Social History Narrative  . Not on file   Social Determinants of Health   Financial Resource Strain: Medium Risk  . Difficulty of Paying Living Expenses: Somewhat hard  Food Insecurity: Food Insecurity Present  . Worried About Charity fundraiser in the Last Year: Sometimes true  . Ran Out of Food in the Last Year: Sometimes true  Transportation Needs: Unmet Transportation Needs  . Lack of Transportation (Medical): No  . Lack of Transportation (Non-Medical): Yes  Physical Activity: Not on file  Stress: Not on file  Social Connections: Not on file  Intimate Partner Violence: Not on file    Physical Exam Vitals reviewed.  Constitutional:      Appearance: Normal  appearance. She is normal weight.  HENT:     Head: Normocephalic.     Nose: Nose normal.     Mouth/Throat:     Mouth: Mucous membranes are moist.     Pharynx: Oropharynx is clear.  Eyes:     Conjunctiva/sclera: Conjunctivae normal.     Pupils: Pupils are equal, round, and reactive to light.  Cardiovascular:     Rate and Rhythm: Normal rate and regular rhythm.     Pulses: Normal pulses.     Heart sounds: Normal heart sounds.  Pulmonary:     Effort: Pulmonary effort is normal.     Breath sounds: Normal breath sounds.  Abdominal:     General: Abdomen is flat.     Palpations: Abdomen is soft.  Musculoskeletal:        General: Normal range of motion.     Cervical back: Normal range of motion.     Right lower leg: No edema.     Left lower leg: No edema.  Skin:    General: Skin is warm and dry.     Capillary Refill: Capillary refill takes less than 2 seconds.  Neurological:     General: No focal deficit present.     Mental Status: She is alert. Mental status is at baseline.  Psychiatric:        Mood and Affect: Mood normal.    Arrived for home visit for Tracey Manning who was alert and oriented reporting to be feeling okay today. Tracey Manning says she is about to go out of town for a few days with her family to [place]. Tracey Manning's vitals were obtained and are as noted. Assessment complete with no edema, lung sounds clear and no JVD. Tracey Manning wants her meds switched to [facility] which I have started transitioning her over to them. Today I verified medications and confirmed them all and filled pill box accordingly. Home visit complete. I will see Tracey Manning in one week.   Refills: Bisoprolol Hydroxyzine Ambien     Future Appointments  Date Time Provider [place]  06/26/2021  1:40 PM Bensimhon, Shaune Pascal, MD [facility] None  08/06/2021 11:15 AM Hilty, Nadean Corwin, MD [facility]     ACTION: Home visit completed Next visit planned for one week

## 2021-05-02 ENCOUNTER — Telehealth (HOSPITAL_COMMUNITY): Payer: Self-pay

## 2021-05-02 ENCOUNTER — Other Ambulatory Visit (HOSPITAL_COMMUNITY): Payer: Self-pay

## 2021-05-02 MED ORDER — CETIRIZINE HCL 10 MG PO TABS: 10.0000 mg | ORAL_TABLET | Freq: Every day | ORAL | 2 refills | Status: DC

## 2021-05-02 MED ORDER — CLOPIDOGREL BISULFATE 75 MG PO TABS: 1.0000 | ORAL_TABLET | Freq: Every day | ORAL | 2 refills | Status: DC

## 2021-05-02 MED ORDER — ALBUTEROL SULFATE HFA 108 (90 BASE) MCG/ACT IN AERS: INHALATION_SPRAY | RESPIRATORY_TRACT | 5 refills | Status: AC

## 2021-05-02 MED ORDER — HYDROXYZINE HCL 25 MG PO TABS: 25.0000 mg | ORAL_TABLET | Freq: Three times a day (TID) | ORAL | 2 refills | Status: DC | PRN

## 2021-05-02 MED ORDER — ZOLPIDEM TARTRATE 10 MG PO TABS
ORAL_TABLET | ORAL | 2 refills | Status: DC
  Filled 2021-06-06: qty 15, 15d supply, fill #0

## 2021-05-02 MED ORDER — ATORVASTATIN CALCIUM 80 MG PO TABS: 1.0000 | ORAL_TABLET | Freq: Every day | ORAL | 2 refills | Status: AC

## 2021-05-02 NOTE — Telephone Encounter (Signed)
Spoke to [facility] who reports they have partial list of medications. I spoke to Ms. Hendersons PCP office and gave them the remainder of her medications needing transfer to [facility]. Christy Sartorius at [facility] aware of refills needed by Monday. I will continue to follow up.

## 2021-05-05 ENCOUNTER — Other Ambulatory Visit (HOSPITAL_COMMUNITY): Payer: Self-pay

## 2021-05-05 ENCOUNTER — Telehealth (HOSPITAL_COMMUNITY): Payer: Self-pay

## 2021-05-05 NOTE — Telephone Encounter (Signed)
Reached out to patient's PCP office as I requested her medications be sent to [facility] on [address] last week but they were sent to [facility] instead. I called to request these be sent over to [facility].

## 2021-05-05 NOTE — Progress Notes (Signed)
Paramedicine Encounter    Patient ID: Tracey Manning, female    DOB: November 20, 1961, 60 y.o.   MRN: [id number]   Patient Care Team: Nolene Ebbs, MD as PCP - General (Internal Medicine) [name], MD as PCP - Cardiology (Cardiology) Bensimhon, Shaune Pascal, MD as PCP - Advanced Heart Failure (Cardiology) Jorge Ny, LCSW as Social Worker (Licensed Clinical Social Worker)  Patient Active Problem List   Diagnosis Date Noted   Acute on chronic combined systolic (congestive) and diastolic (congestive) heart failure ([place]) 03/10/2021   Acute respiratory failure with hypoxia ([place]) 02/23/2021   Impingement syndrome of left shoulder 03/12/2020   Chronic pain 07/18/2019   CRI (chronic renal insufficiency), stage 3 (moderate) 07/18/2019   COPD (chronic obstructive pulmonary disease) ([place]) 06/28/2019   Obesity (BMI 30-39.9) 06/28/2019   Ischemic cardiomyopathy 06/28/2019   Dyspnea on exertion 70/35/0093   Acute systolic CHF (congestive heart failure) ([place])    AKI (acute kidney injury) ([place])    Abnormal uterine bleeding (AUB) 04/03/2016   Dysfunctional uterine bleeding 11/14/2015   Bacterial vaginosis (recurrent) 11/14/2015   Atypical chest pain 11/14/2015   Essential hypertension 04/04/2014   Dyslipidemia, goal LDL below 70 04/04/2014   CAD S/P multiple PCIs 04/04/2014    Current Outpatient Medications:    acetaminophen (TYLENOL) 500 MG tablet, TAKE 2 TABLETS BY MOUTH EVERY 6 HOURS AS NEEDED, Disp: 100 tablet, Rfl: 0   albuterol (PROVENTIL HFA) 108 (90 Base) MCG/ACT inhaler, Inhale 2 puffs by mouth every 4 hours as needed, Disp: 6.7 g, Rfl: 5   albuterol (PROVENTIL) (2.5 MG/3ML) 0.083% nebulizer solution, INHALE 1 VIAL VIA NEBULIZER EVERY 6 HOURS AS NEEDED (Patient taking differently: Take 2.5 mg by nebulization every 6 (six) hours as needed for wheezing or shortness of breath.), Disp: 90 mL, Rfl: 11   albuterol (VENTOLIN HFA) 108 (90 Base) MCG/ACT inhaler, INHALE 2 PUFFS EVERY 4 HOURS  AS NEEDED (Patient taking differently: Inhale 2 puffs into the lungs every 4 (four) hours as needed for shortness of breath or wheezing.), Disp: 8.5 g, Rfl: 5   aspirin 81 MG EC tablet, Take 1 tablet (81 mg total) by mouth daily., Disp: 90 tablet, Rfl: 3   atorvastatin (LIPITOR) 80 MG tablet, TAKE 1 TABLET BY MOUTH ONCE A DAY, Disp: 90 tablet, Rfl: 3   atorvastatin (LIPITOR) 80 MG tablet, Take 1 tablet (80 mg total) by mouth daily., Disp: 90 tablet, Rfl: 2   benzonatate (TESSALON) 100 MG capsule, Take 1 capsule (100 mg total) by mouth 2 (two) times daily., Disp: 20 capsule, Rfl: 0   bisoprolol (ZEBETA) 5 MG tablet, Take 1 tablet (5 mg total) by mouth daily., Disp: 30 tablet, Rfl: 3   cetirizine (ZYRTEC ALLERGY) 10 MG tablet, Take 1 tablet (10 mg total) by mouth daily as needed, Disp: 90 tablet, Rfl: 2   cetirizine (ZYRTEC) 10 MG tablet, TAKE 1 TABLET BY MOUTH ONCE A DAY AS NEEDED (Patient taking differently: Take 10 mg by mouth daily as needed for allergies.), Disp: 90 tablet, Rfl: 2   clopidogrel (PLAVIX) 75 MG tablet, Take 1 tablet (75 mg total) by mouth daily., Disp: 90 tablet, Rfl: 2   clopidogrel (PLAVIX) 75 MG tablet, Take 1 tablet (75 mg total) by mouth daily., Disp: 90 tablet, Rfl: 2   clotrimazole-betamethasone (LOTRISONE) cream, Apply 2 times a day as needed, Disp: 30 g, Rfl: 2   dapagliflozin propanediol (FARXIGA) 10 MG TABS tablet, TAKE 1 TABLET BY MOUTH DAILY BEFORE BREAKFAST, Disp: 90 tablet,  Rfl: 3   diclofenac Sodium (VOLTAREN) 1 % GEL, APPLY 4 GRAMS FOUR TIMES DAILY AS NEEDED FOR PAINS AS DIRECTED, Disp: 500 g, Rfl: 5   Evolocumab 140 MG/ML SOAJ, INJECT 1 DOSE INTO THE SKIN EVERY 14 (FOURTEEN) DAYS., Disp: 2 mL, Rfl: 11   Fluticasone-Umeclidin-Vilant (TRELEGY ELLIPTA) 200-62.5-25 MCG/INH AEPB, Inhale 1 puff into the lungs daily as needed (wheezing/sob)., Disp: , Rfl:    furosemide (LASIX) 40 MG tablet, TAKE 1 TABLET BY MOUTH DAILY., Disp: 90 tablet, Rfl: 3   hydrOXYzine  (ATARAX/VISTARIL) 25 MG tablet, Take 1 tablet (25 mg total) by mouth 3 (three) times daily as needed for anxiety, Disp: 90 tablet, Rfl: 2   hydrOXYzine (ATARAX/VISTARIL) 25 MG tablet, Take 1 tablet (25 mg total) by mouth 3 (three) times daily as needed for anxiety, Disp: 90 tablet, Rfl: 2   LORazepam (ATIVAN) 0.5 MG tablet, Take 1-2 tablets by mouth 30 - 60 min prior to MRI. Do not drive with this medicine. (Patient not taking: Reported on 04/28/2021), Disp: 4 tablet, Rfl: 0   nitroGLYCERIN (NITROSTAT) 0.4 MG SL tablet, DISSOLVE 1 TABLET UNDER THE TONGUE AS NEEDED AS DIRECTED, Disp: 25 tablet, Rfl: 5   ofloxacin (OCUFLOX) 0.3 % ophthalmic solution, INSTILL 1 DROP 3 TIMES DAILY IN OPERATIVE EYE STARTING 2 DAYS PRIOR TO SURGERY AND AFTER SURGERY FOR 3 WEEKS., Disp: 5 mL, Rfl: 1   potassium chloride (KLOR-CON) 10 MEQ tablet, Take 2 tablets (20 mEq total) by mouth 2 (two) times daily., Disp: 120 tablet, Rfl: 11   prednisoLONE acetate (PRED FORTE) 1 % ophthalmic suspension, APPLY ONE DROP IN OPERATIVE EYE(S) 3 TIMES DAILY STARTING 2 DAYS BEFORE SURGERY AND AFTER SURGERY FOR 3 WEEKS (Patient taking differently: Place 1 drop into the left eye See admin instructions. Apply one drop in operative eye three times daily starting 2 days before surgery and after surgery for 3 weeks), Disp: 5 mL, Rfl: 1   pregabalin (LYRICA) 75 MG capsule, Take 1 capsule (75 mg total) by mouth 2 (two) times daily as needed (nerve pain)., Disp: 60 capsule, Rfl: 3   sacubitril-valsartan (ENTRESTO) 97-103 MG, Take 1 tablet by mouth 2 (two) times daily., Disp: 180 tablet, Rfl: 3   spironolactone (ALDACTONE) 25 MG tablet, TAKE 1 TABLET BY MOUTH DAILY, Disp: 30 tablet, Rfl: 3   Vitamin D, Ergocalciferol, (DRISDOL) 1.25 MG (50000 UNIT) CAPS capsule, TAKE 1 CAPSULE BY MOUTH ONCE A WEEK (Patient taking differently: Take 50,000 Units by mouth once a week.), Disp: 4 capsule, Rfl: 5   zolpidem (AMBIEN) 10 MG tablet, Take 1 tablet (10 mg total) by  mouth at bedtime as needed for sleep, Disp: 30 tablet, Rfl: 0   zolpidem (AMBIEN) 10 MG tablet, Take 1 tablet by mouth every night at bedtime as needed for sleep, Disp: 30 tablet, Rfl: 2 Allergies  Allergen Reactions   Coreg [Carvedilol] Other (See Comments)    Headaches   Strawberry Extract Anaphylaxis, Hives, Swelling and Other (See Comments)    "Fresh strawberries" made the throat swell (2014)   Imdur [Isosorbide Nitrate] Other (See Comments)    Headaches    Amoxicillin Nausea And Vomiting    Did it involve swelling of the face/tongue/throat, SOB, or low BP? Yes Did it involve sudden or severe rash/hives, skin peeling, or any reaction on the inside of your mouth or nose? Yes Did you need to seek medical attention at a hospital or doctor's office? Yes When did it last happen?  within last 10 years If all above answers are "NO", may proceed with cephalosporin use.    Dilaudid [Hydromorphone Hcl] Nausea And Vomiting   Ibuprofen Other (See Comments)    Wheezing    Latex Swelling and Other (See Comments)    No reaction with Band-Aids, though   Metoprolol Other (See Comments)    Headaches and wheezing     Social History   Socioeconomic History   Marital status: Single    Spouse name: Not on file   Number of children: 2   Years of education: 12   Highest education level: High school graduate  Occupational History   Not on file  Tobacco Use   Smoking status: Every Day    Packs/day: 0.50    Years: 34.00    Pack years: 17.00    Types: Cigarettes   Smokeless tobacco: Never  Vaping Use   Vaping Use: Never used  Substance and Sexual Activity   Alcohol use: No    Alcohol/week: 0.0 standard drinks    Comment: seldom   Drug use: Yes    Types: Marijuana    Comment: 06/19/19   Sexual activity: Yes    Partners: Male    Birth control/protection: Post-menopausal  Other Topics Concern   Not on file  Social History Narrative   Not on file   Social Determinants of Health    Financial Resource Strain: Medium Risk   Difficulty of Paying Living Expenses: Somewhat hard  Food Insecurity: Food Insecurity Present   Worried About Charity fundraiser in the Last Year: Sometimes true   Arboriculturist in the Last Year: Sometimes true  Transportation Needs: Unmet Transportation Needs   Lack of Transportation (Medical): No   Lack of Transportation (Non-Medical): Yes  Physical Activity: Not on file  Stress: Not on file  Social Connections: Not on file  Intimate Partner Violence: Not on file    Physical Exam Vitals reviewed.  Constitutional:      Appearance: She is normal weight.  HENT:     Head: Normocephalic.     Nose: Nose normal.     Mouth/Throat:     Mouth: Mucous membranes are moist.     Pharynx: Oropharynx is clear.  Eyes:     Conjunctiva/sclera: Conjunctivae normal.     Pupils: Pupils are equal, round, and reactive to light.  Cardiovascular:     Rate and Rhythm: Normal rate and regular rhythm.     Pulses: Normal pulses.     Heart sounds: Normal heart sounds.  Pulmonary:     Effort: Pulmonary effort is normal.     Breath sounds: Normal breath sounds.  Abdominal:     General: Abdomen is flat.     Palpations: Abdomen is soft.  Musculoskeletal:        General: Normal range of motion.     Cervical back: Normal range of motion.  Skin:    General: Skin is warm and dry.     Capillary Refill: Capillary refill takes less than 2 seconds.  Neurological:     General: No focal deficit present.     Mental Status: She is alert. Mental status is at baseline.  Psychiatric:        Mood and Affect: Mood normal.    Arrived for home visit for Rockell who was alert and oriented reporting feeling tired today. Vitals obtained and are as noted. Asianna has been compliant with medications over the last week. I reviewed medications. I picked  up two from [facility] today including (Bisoprolol and Lasix). I filled pill box accordingly. Kevia is in need of  Hydroxyzine. She has 7 pills left. I placed one in the morning time slot for her for the next 7 days. Zorina was agreeable to this. Repatha injection given at 1500. I reviewed appointments with Najiyah and confirmed transportation with [facility]. Elois had no swelling, she reports no chest pain but says she does get dizzy sometimes. Orthostatic vitals did not change. Home visit complete for Arionna. I will see her again in one week.   Refills: Repatha Hydroxyzine     Future Appointments  Date Time Provider [place]  05/09/2021  3:50 PM [facility] MR 2 [facility]  05/09/2021  4:20 PM [facility] MR 2 [facility]  06/26/2021  1:40 PM Bensimhon, Shaune Pascal, MD [facility] None  08/06/2021 11:15 AM Hilty, Nadean Corwin, MD [facility]     ACTION: Home visit completed Next visit planned for one week

## 2021-05-09 ENCOUNTER — Ambulatory Visit
Admission: RE | Admit: 2021-05-09 | Discharge: 2021-05-09 | Disposition: A | Discharge status: Home / Self Care | Payer: Medicaid Other | Source: Ambulatory Visit | Attending: Family Medicine | Admitting: Family Medicine

## 2021-05-09 DIAGNOSIS — M5416 Radiculopathy, lumbar region: Secondary | ICD-10-CM

## 2021-05-09 DIAGNOSIS — M5412 Radiculopathy, cervical region: Secondary | ICD-10-CM

## 2021-05-12 ENCOUNTER — Other Ambulatory Visit (HOSPITAL_COMMUNITY): Payer: Self-pay

## 2021-05-12 ENCOUNTER — Telehealth: Payer: Self-pay

## 2021-05-12 DIAGNOSIS — M48062 Spinal stenosis, lumbar region with neurogenic claudication: Secondary | ICD-10-CM

## 2021-05-12 NOTE — Telephone Encounter (Signed)
Spoke w/ pt to inform her the epidural steroid injection had been ordered to stop the aspirin and Plavix. Pt verbalized understanding.

## 2021-05-12 NOTE — Progress Notes (Signed)
MRI cervical spine shows some potential areas for nerve to get pinched but no severe badly pinched nerve.  We could proceed with an injection in her neck however we should talk about the best location.  Additionally will want to talk about your back MRI as well.

## 2021-05-12 NOTE — Progress Notes (Signed)
Paramedicine Encounter    Patient ID: Tracey Manning, female    DOB: January 08, 1961, 60 y.o.   MRN: [id number]   Patient Care Team: Nolene Ebbs, MD as PCP - General (Internal Medicine) [name], MD as PCP - Cardiology (Cardiology) Bensimhon, Shaune Pascal, MD as PCP - Advanced Heart Failure (Cardiology) Jorge Ny, LCSW as Social Worker (Licensed Clinical Social Worker)  Patient Active Problem List   Diagnosis Date Noted   Acute on chronic combined systolic (congestive) and diastolic (congestive) heart failure ([place]) 03/10/2021   Acute respiratory failure with hypoxia ([place]) 02/23/2021   Impingement syndrome of left shoulder 03/12/2020   Chronic pain 07/18/2019   CRI (chronic renal insufficiency), stage 3 (moderate) 07/18/2019   COPD (chronic obstructive pulmonary disease) ([place]) 06/28/2019   Obesity (BMI 30-39.9) 06/28/2019   Ischemic cardiomyopathy 06/28/2019   Dyspnea on exertion 20/94/7096   Acute systolic CHF (congestive heart failure) ([place])    AKI (acute kidney injury) ([place])    Abnormal uterine bleeding (AUB) 04/03/2016   Dysfunctional uterine bleeding 11/14/2015   Bacterial vaginosis (recurrent) 11/14/2015   Atypical chest pain 11/14/2015   Essential hypertension 04/04/2014   Dyslipidemia, goal LDL below 70 04/04/2014   CAD S/P multiple PCIs 04/04/2014    Current Outpatient Medications:    acetaminophen (TYLENOL) 500 MG tablet, TAKE 2 TABLETS BY MOUTH EVERY 6 HOURS AS NEEDED, Disp: 100 tablet, Rfl: 0   albuterol (PROVENTIL HFA) 108 (90 Base) MCG/ACT inhaler, Inhale 2 puffs by mouth every 4 hours as needed, Disp: 6.7 g, Rfl: 5   albuterol (PROVENTIL) (2.5 MG/3ML) 0.083% nebulizer solution, INHALE 1 VIAL VIA NEBULIZER EVERY 6 HOURS AS NEEDED (Patient taking differently: Take 2.5 mg by nebulization every 6 (six) hours as needed for wheezing or shortness of breath.), Disp: 90 mL, Rfl: 11   albuterol (VENTOLIN HFA) 108 (90 Base) MCG/ACT inhaler, INHALE 2 PUFFS EVERY 4 HOURS  AS NEEDED (Patient taking differently: Inhale 2 puffs into the lungs every 4 (four) hours as needed for shortness of breath or wheezing.), Disp: 8.5 g, Rfl: 5   aspirin 81 MG EC tablet, Take 1 tablet (81 mg total) by mouth daily., Disp: 90 tablet, Rfl: 3   atorvastatin (LIPITOR) 80 MG tablet, TAKE 1 TABLET BY MOUTH ONCE A DAY, Disp: 90 tablet, Rfl: 3   atorvastatin (LIPITOR) 80 MG tablet, Take 1 tablet (80 mg total) by mouth daily., Disp: 90 tablet, Rfl: 2   benzonatate (TESSALON) 100 MG capsule, Take 1 capsule (100 mg total) by mouth 2 (two) times daily., Disp: 20 capsule, Rfl: 0   bisoprolol (ZEBETA) 5 MG tablet, Take 1 tablet (5 mg total) by mouth daily., Disp: 30 tablet, Rfl: 3   cetirizine (ZYRTEC ALLERGY) 10 MG tablet, Take 1 tablet (10 mg total) by mouth daily as needed, Disp: 90 tablet, Rfl: 2   cetirizine (ZYRTEC) 10 MG tablet, TAKE 1 TABLET BY MOUTH ONCE A DAY AS NEEDED (Patient taking differently: Take 10 mg by mouth daily as needed for allergies.), Disp: 90 tablet, Rfl: 2   clopidogrel (PLAVIX) 75 MG tablet, Take 1 tablet (75 mg total) by mouth daily., Disp: 90 tablet, Rfl: 2   clopidogrel (PLAVIX) 75 MG tablet, Take 1 tablet (75 mg total) by mouth daily., Disp: 90 tablet, Rfl: 2   clotrimazole-betamethasone (LOTRISONE) cream, Apply 2 times a day as needed, Disp: 30 g, Rfl: 2   dapagliflozin propanediol (FARXIGA) 10 MG TABS tablet, TAKE 1 TABLET BY MOUTH DAILY BEFORE BREAKFAST, Disp: 90 tablet,  Rfl: 3   diclofenac Sodium (VOLTAREN) 1 % GEL, APPLY 4 GRAMS FOUR TIMES DAILY AS NEEDED FOR PAINS AS DIRECTED, Disp: 500 g, Rfl: 5   Evolocumab 140 MG/ML SOAJ, INJECT 1 DOSE INTO THE SKIN EVERY 14 (FOURTEEN) DAYS., Disp: 2 mL, Rfl: 11   Fluticasone-Umeclidin-Vilant (TRELEGY ELLIPTA) 200-62.5-25 MCG/INH AEPB, Inhale 1 puff into the lungs daily as needed (wheezing/sob)., Disp: , Rfl:    furosemide (LASIX) 40 MG tablet, TAKE 1 TABLET BY MOUTH DAILY., Disp: 90 tablet, Rfl: 3   hydrOXYzine  (ATARAX/VISTARIL) 25 MG tablet, Take 1 tablet (25 mg total) by mouth 3 (three) times daily as needed for anxiety, Disp: 90 tablet, Rfl: 2   hydrOXYzine (ATARAX/VISTARIL) 25 MG tablet, Take 1 tablet (25 mg total) by mouth 3 (three) times daily as needed for anxiety, Disp: 90 tablet, Rfl: 2   LORazepam (ATIVAN) 0.5 MG tablet, Take 1-2 tablets by mouth 30 - 60 min prior to MRI. Do not drive with this medicine. (Patient not taking: No sig reported), Disp: 4 tablet, Rfl: 0   nitroGLYCERIN (NITROSTAT) 0.4 MG SL tablet, DISSOLVE 1 TABLET UNDER THE TONGUE AS NEEDED AS DIRECTED, Disp: 25 tablet, Rfl: 5   ofloxacin (OCUFLOX) 0.3 % ophthalmic solution, INSTILL 1 DROP 3 TIMES DAILY IN OPERATIVE EYE STARTING 2 DAYS PRIOR TO SURGERY AND AFTER SURGERY FOR 3 WEEKS., Disp: 5 mL, Rfl: 1   potassium chloride (KLOR-CON) 10 MEQ tablet, Take 2 tablets (20 mEq total) by mouth 2 (two) times daily., Disp: 120 tablet, Rfl: 11   prednisoLONE acetate (PRED FORTE) 1 % ophthalmic suspension, APPLY ONE DROP IN OPERATIVE EYE(S) 3 TIMES DAILY STARTING 2 DAYS BEFORE SURGERY AND AFTER SURGERY FOR 3 WEEKS, Disp: 5 mL, Rfl: 1   pregabalin (LYRICA) 75 MG capsule, Take 1 capsule (75 mg total) by mouth 2 (two) times daily as needed (nerve pain)., Disp: 60 capsule, Rfl: 3   sacubitril-valsartan (ENTRESTO) 97-103 MG, Take 1 tablet by mouth 2 (two) times daily., Disp: 180 tablet, Rfl: 3   spironolactone (ALDACTONE) 25 MG tablet, TAKE 1 TABLET BY MOUTH DAILY, Disp: 30 tablet, Rfl: 3   Vitamin D, Ergocalciferol, (DRISDOL) 1.25 MG (50000 UNIT) CAPS capsule, TAKE 1 CAPSULE BY MOUTH ONCE A WEEK (Patient taking differently: Take 50,000 Units by mouth once a week.), Disp: 4 capsule, Rfl: 5   zolpidem (AMBIEN) 10 MG tablet, Take 1 tablet (10 mg total) by mouth at bedtime as needed for sleep, Disp: 30 tablet, Rfl: 0   zolpidem (AMBIEN) 10 MG tablet, Take 1 tablet by mouth every night at bedtime as needed for sleep, Disp: 30 tablet, Rfl: 2 Allergies   Allergen Reactions   Coreg [Carvedilol] Other (See Comments)    Headaches   Strawberry Extract Anaphylaxis, Hives, Swelling and Other (See Comments)    "Fresh strawberries" made the throat swell (2014)   Imdur [Isosorbide Nitrate] Other (See Comments)    Headaches    Amoxicillin Nausea And Vomiting    Did it involve swelling of the face/tongue/throat, SOB, or low BP? Yes Did it involve sudden or severe rash/hives, skin peeling, or any reaction on the inside of your mouth or nose? Yes Did you need to seek medical attention at a hospital or doctor's office? Yes When did it last happen?      within last 10 years If all above answers are "NO", may proceed with cephalosporin use.    Dilaudid [Hydromorphone Hcl] Nausea And Vomiting   Ibuprofen Other (See Comments)  Wheezing    Latex Swelling and Other (See Comments)    No reaction with Band-Aids, though   Metoprolol Other (See Comments)    Headaches and wheezing     Social History   Socioeconomic History   Marital status: Single    Spouse name: Not on file   Number of children: 2   Years of education: 12   Highest education level: High school graduate  Occupational History   Not on file  Tobacco Use   Smoking status: Every Day    Packs/day: 0.50    Years: 34.00    Pack years: 17.00    Types: Cigarettes   Smokeless tobacco: Never  Vaping Use   Vaping Use: Never used  Substance and Sexual Activity   Alcohol use: No    Alcohol/week: 0.0 standard drinks    Comment: seldom   Drug use: Yes    Types: Marijuana    Comment: 06/19/19   Sexual activity: Yes    Partners: Male    Birth control/protection: Post-menopausal  Other Topics Concern   Not on file  Social History Narrative   Not on file   Social Determinants of Health   Financial Resource Strain: Medium Risk   Difficulty of Paying Living Expenses: Somewhat hard  Food Insecurity: Food Insecurity Present   Worried About Charity fundraiser in the Last Year:  Sometimes true   Arboriculturist in the Last Year: Sometimes true  Transportation Needs: Unmet Transportation Needs   Lack of Transportation (Medical): No   Lack of Transportation (Non-Medical): Yes  Physical Activity: Not on file  Stress: Not on file  Social Connections: Not on file  Intimate Partner Violence: Not on file    Physical Exam Vitals reviewed.  Constitutional:      Appearance: Normal appearance. She is normal weight.  HENT:     Head: Normocephalic.     Nose: Nose normal.     Mouth/Throat:     Mouth: Mucous membranes are moist.     Pharynx: Oropharynx is clear.  Eyes:     Conjunctiva/sclera: Conjunctivae normal.     Pupils: Pupils are equal, round, and reactive to light.  Cardiovascular:     Rate and Rhythm: Normal rate and regular rhythm.     Pulses: Normal pulses.     Heart sounds: Normal heart sounds.  Pulmonary:     Effort: Pulmonary effort is normal.     Breath sounds: Normal breath sounds.  Abdominal:     General: Abdomen is flat.     Palpations: Abdomen is soft.  Musculoskeletal:        General: No swelling. Normal range of motion.     Cervical back: Normal range of motion.     Right lower leg: No edema.     Left lower leg: No edema.  Skin:    General: Skin is warm and dry.     Capillary Refill: Capillary refill takes less than 2 seconds.  Neurological:     General: No focal deficit present.     Mental Status: She is alert. Mental status is at baseline.  Psychiatric:        Mood and Affect: Mood normal.    Arrived for home visit for Tracey Manning who was alert and oriented reporting to be feeling good today. Tracey Manning denied chest pain, shortness of breath, dizziness. Tracey Manning has been compliant with her medications. I reviewed same and filled pill box accordingly. Tracey Manning's vitals and assessment were as noted  in report. Tracey Manning and I reviewed appointments and wrote them in her calendar. Tracey Manning has transportation set up through [employer]. I will see Tracey Manning in one week. Home  visit complete.   Refills: Potassium Farxiga Vitamin D  Spironolactone Pregabalin Hydroxyzine Repatha     Future Appointments  Date Time Provider [place]  05/19/2021 10:30 AM [name], MD [facility] None  06/26/2021  1:40 PM Bensimhon, Shaune Pascal, MD [facility] None  08/06/2021 11:15 AM Hilty, Nadean Corwin, MD [facility]     ACTION: Home visit completed

## 2021-05-12 NOTE — Telephone Encounter (Signed)
Spoke w/ pt to go over MRI results. Pt was not interested in proceed w/ a c-spine injection, but would like to try an injection in her low back. Please proceed w/ ordering.  Lonn Georgia

## 2021-05-12 NOTE — Progress Notes (Signed)
MRI lumbar spine shows potential for nerves being pinched specifically S1 on both sides.  This could cause the pain down your legs.  This should be treated with a back injection.  Please schedule follow-up appoint with me in the near future to go over the results.

## 2021-05-12 NOTE — Telephone Encounter (Signed)
Epidural steroid injection ordered.  Please contact [facility] at [phone] to schedule.  Stop aspirin and Plavix now.  You need to be off of aspirin and Plavix for 1 week prior to injection.

## 2021-05-12 NOTE — Addendum Note (Signed)
Addended by: Gregor Hams on: 05/12/2021 11:34 AM   Modules accepted: Orders

## 2021-05-13 ENCOUNTER — Other Ambulatory Visit: Payer: Self-pay | Admitting: Cardiovascular Disease

## 2021-05-13 ENCOUNTER — Other Ambulatory Visit: Payer: Self-pay | Admitting: Adult Health

## 2021-05-13 ENCOUNTER — Other Ambulatory Visit: Payer: Self-pay | Admitting: Internal Medicine

## 2021-05-19 ENCOUNTER — Encounter: Payer: Self-pay | Admitting: Obstetrics

## 2021-05-19 ENCOUNTER — Other Ambulatory Visit: Payer: Self-pay

## 2021-05-19 ENCOUNTER — Ambulatory Visit (INDEPENDENT_AMBULATORY_CARE_PROVIDER_SITE_OTHER): Payer: Medicaid Other | Admitting: Obstetrics

## 2021-05-19 ENCOUNTER — Other Ambulatory Visit: Payer: Self-pay | Admitting: Internal Medicine

## 2021-05-19 ENCOUNTER — Other Ambulatory Visit (HOSPITAL_COMMUNITY)
Admission: RE | Admit: 2021-05-19 | Discharge: 2021-05-19 | Disposition: A | Discharge status: Home / Self Care | Payer: Medicaid Other | Source: Ambulatory Visit | Attending: Obstetrics | Admitting: Obstetrics

## 2021-05-19 VITALS — BP 108/58 | HR 41 | Ht 61.0 in | Wt 204.5 lb

## 2021-05-19 DIAGNOSIS — R300 Dysuria: Secondary | ICD-10-CM

## 2021-05-19 DIAGNOSIS — E669 Obesity, unspecified: Secondary | ICD-10-CM

## 2021-05-19 DIAGNOSIS — R102 Pelvic and perineal pain: Secondary | ICD-10-CM

## 2021-05-19 DIAGNOSIS — R81 Glycosuria: Secondary | ICD-10-CM

## 2021-05-19 DIAGNOSIS — N898 Other specified noninflammatory disorders of vagina: Secondary | ICD-10-CM

## 2021-05-19 LAB — POCT URINALYSIS DIPSTICK
Blood, UA: NEGATIVE
Glucose, UA: POSITIVE — AB
Ketones, UA: NEGATIVE
Nitrite, UA: NEGATIVE
Protein, UA: NEGATIVE

## 2021-05-19 MED ORDER — EVOLOCUMAB 140 MG/ML ~~LOC~~ SOAJ: SUBCUTANEOUS | 5 refills | Status: DC

## 2021-05-19 MED ORDER — OXYCODONE-ACETAMINOPHEN 5-325 MG PO TABS: 1.0000 | ORAL_TABLET | ORAL | 0 refills | Status: DC | PRN

## 2021-05-19 MED ORDER — KETOROLAC TROMETHAMINE 60 MG/2ML IM SOLN
60.0000 mg | Freq: Once | INTRAMUSCULAR | Status: AC
  Administered 2021-05-19: 60 mg via INTRAMUSCULAR

## 2021-05-19 NOTE — Progress Notes (Signed)
Patient ID: Tracey Manning, female   DOB: 1961-08-01, 60 y.o.   MRN: [id number]  Chief Complaint  Patient presents with   Abdominal Pain    HPI Tracey Manning is a 60 y.o. female.  Complains of lower abdominal pain and vaginal discharge`.  Also has suprapubic pain after voiding. HPI  Past Medical History:  Diagnosis Date   Anxiety    Arthritis    Asthma    Barrett's esophagus    Chest pain    Collagen vascular disease ([place])    Colon polyps    Coronary artery disease    Depression    Dyslipidemia    GERD (gastroesophageal reflux disease)    occ   Hyperlipidemia    Hypertension    Myocardial infarct ([place]) [id number]   Sciatica    Sleep apnea    no CPAP ordered but using oxygen at bedtime   Tobacco abuse     Past Surgical History:  Procedure Laterality Date   CARDIAC CATHETERIZATION  10/09/2003   [facility] [place], [place]) - LAD with 30% prox narrowing, 50% stenosis in mid-portion of PLA; RCA with 40% narrowing proximally ([name], [name])   CARDIAC CATHETERIZATION  10/10/2007   70% stenosis in first septal perforator branch of LAD, 60-70% narrowing in mid LAD, 20% narrowing in mid AV groove Cfx with 80% diffuse narrowing in small distal marginal, total occlusion of mid RCA with L to R collaterals, 90% stenosis diffusely in prox branch of RCA followed by 70% stenosis in secondary curve & 80% stenosis in small marginal branch ([name])   CARDIAC CATHETERIZATION  05/28/2008   normal L main, RCA with 100% prox lesion w/distal filling from LAD collaterals, LAD with 20% prox tubular lesion/ 40% mid LAD lesion/previous stent patent ([name])   CARDIAC CATHETERIZATION  09/15/2009   discrete 100% osital RCA lesion, 50% prox LAD lesion, non-obstructive disease in all coronaries ([name])   [place] W/ POLYPECTOMY     CORONARY ANGIOPLASTY WITH STENT PLACEMENT  10/31/2007   PCI of distal Cfx with 2.25x29mm Taxus Adam  DES, 60% narrowing of mid LAD ([name])   CORONARY ANGIOPLASTY WITH STENT PLACEMENT  06/11/2011   PCI of prox-mid LAD with 3x45mm DES Resolute ([name])   CORONARY STENT INTERVENTION N/A 06/19/2019   Procedure: CORONARY STENT INTERVENTION;  Surgeon: [name], MD;  Location: [facility];  Service: Cardiovascular;  Laterality: N/A;  LAD   DILATION AND CURETTAGE, DIAGNOSTIC / THERAPEUTIC     DILITATION & CURRETTAGE/HYSTROSCOPY WITH HYDROTHERMAL ABLATION N/A 04/03/2016   Procedure: DILATATION & CURETTAGE/HYSTEROSCOPY WITH HYDROTHERMAL ABLATION;  Surgeon: [name], MD;  Location: [facility];  Service: Gynecology;  Laterality: N/A;   NM MYOCAR PERF WALL MOTION  08/2009   persantine myoview - normal perfusion in all regions, perfusion defect in anterior region (breast attenuation), EF 52%, low risk scan   RIGHT/LEFT HEART CATH AND CORONARY ANGIOGRAPHY N/A 06/15/2019   Procedure: RIGHT/LEFT HEART CATH AND CORONARY ANGIOGRAPHY;  Surgeon: Belva Crome, MD;  Location: [facility];  Service: Cardiovascular;  Laterality: N/A;    Family History  Problem Relation Age of Onset   Leukemia Mother    Clotting disorder Father        blood clot   Hypertension Sister    Diabetes Sister    Stroke Sister 87   Bleeding Disorder Son  ITP "free bleeding disorder"   Colon cancer Paternal Grandmother    Diabetes Paternal Grandmother    Stomach cancer Neg Hx    Pancreatic cancer Neg Hx     Social History Social History   Tobacco Use   Smoking status: Every Day    Packs/day: 0.50    Years: 34.00    Pack years: 17.00    Types: Cigarettes   Smokeless tobacco: Never  Vaping Use   Vaping Use: Never used  Substance Use Topics   Alcohol use: No    Alcohol/week: 0.0 standard drinks    Comment: seldom   Drug use: Yes    Types: Marijuana    Comment: 06/19/19    Allergies  Allergen Reactions   Coreg [Carvedilol] Other (See Comments)    Headaches   Strawberry  Extract Anaphylaxis, Hives, Swelling and Other (See Comments)    "Fresh strawberries" made the throat swell (2014)   Imdur [Isosorbide Nitrate] Other (See Comments)    Headaches    Amoxicillin Nausea And Vomiting    Did it involve swelling of the face/tongue/throat, SOB, or low BP? Yes Did it involve sudden or severe rash/hives, skin peeling, or any reaction on the inside of your mouth or nose? Yes Did you need to seek medical attention at a hospital or doctor's office? Yes When did it last happen?      within last 10 years If all above answers are "NO", may proceed with cephalosporin use.    Dilaudid [Hydromorphone Hcl] Nausea And Vomiting   Ibuprofen Other (See Comments)    Wheezing    Latex Swelling and Other (See Comments)    No reaction with Band-Aids, though   Metoprolol Other (See Comments)    Headaches and wheezing    Current Outpatient Medications  Medication Sig Dispense Refill   albuterol (PROVENTIL HFA) 108 (90 Base) MCG/ACT inhaler Inhale 2 puffs by mouth every 4 hours as needed 6.7 g 5   albuterol (PROVENTIL) (2.5 MG/3ML) 0.083% nebulizer solution INHALE 1 VIAL VIA NEBULIZER EVERY 6 HOURS AS NEEDED (Patient taking differently: Take 2.5 mg by nebulization every 6 (six) hours as needed for wheezing or shortness of breath.) 90 mL 11   albuterol (VENTOLIN HFA) 108 (90 Base) MCG/ACT inhaler INHALE 2 PUFFS EVERY 4 HOURS AS NEEDED (Patient taking differently: Inhale 2 puffs into the lungs every 4 (four) hours as needed for shortness of breath or wheezing.) 8.5 g 5   aspirin 81 MG EC tablet Take 1 tablet (81 mg total) by mouth daily. 90 tablet 3   atorvastatin (LIPITOR) 80 MG tablet TAKE 1 TABLET BY MOUTH ONCE A DAY 90 tablet 3   cetirizine (ZYRTEC ALLERGY) 10 MG tablet Take 1 tablet (10 mg total) by mouth daily as needed 90 tablet 2   Evolocumab 140 MG/ML SOAJ INJECT 1 DOSE INTO THE SKIN EVERY 14 (FOURTEEN) DAYS. 2 mL 5   furosemide (LASIX) 40 MG tablet TAKE 1 TABLET BY MOUTH  DAILY. 90 tablet 3   hydrOXYzine (ATARAX/VISTARIL) 25 MG tablet Take 1 tablet (25 mg total) by mouth 3 (three) times daily as needed for anxiety 90 tablet 2   ofloxacin (OCUFLOX) 0.3 % ophthalmic solution INSTILL 1 DROP 3 TIMES DAILY IN OPERATIVE EYE STARTING 2 DAYS PRIOR TO SURGERY AND AFTER SURGERY FOR 3 WEEKS. 5 mL 1   oxyCODONE-acetaminophen (PERCOCET) 5-325 MG tablet Take 1 tablet by mouth every 4 (four) hours as needed for severe pain. 20 tablet 0   potassium chloride (  KLOR-CON) 10 MEQ tablet Take 2 tablets (20 mEq total) by mouth 2 (two) times daily. 120 tablet 11   pregabalin (LYRICA) 75 MG capsule Take 1 capsule (75 mg total) by mouth 2 (two) times daily as needed (nerve pain). 60 capsule 3   sacubitril-valsartan (ENTRESTO) 97-103 MG Take 1 tablet by mouth 2 (two) times daily. 180 tablet 3   spironolactone (ALDACTONE) 25 MG tablet TAKE 1 TABLET BY MOUTH DAILY 30 tablet 3   Vitamin D, Ergocalciferol, (DRISDOL) 1.25 MG (50000 UNIT) CAPS capsule TAKE 1 CAPSULE BY MOUTH ONCE A WEEK (Patient taking differently: Take 50,000 Units by mouth once a week.) 4 capsule 5   zolpidem (AMBIEN) 10 MG tablet Take 1 tablet (10 mg total) by mouth at bedtime as needed for sleep 30 tablet 0   atorvastatin (LIPITOR) 80 MG tablet Take 1 tablet (80 mg total) by mouth daily. 90 tablet 2   benzonatate (TESSALON) 100 MG capsule Take 1 capsule (100 mg total) by mouth 2 (two) times daily. (Patient not taking: Reported on 05/19/2021) 20 capsule 0   bisoprolol (ZEBETA) 5 MG tablet Take 1 tablet (5 mg total) by mouth daily. 30 tablet 3   cetirizine (ZYRTEC) 10 MG tablet TAKE 1 TABLET BY MOUTH ONCE A DAY AS NEEDED (Patient taking differently: Take 10 mg by mouth daily as needed for allergies.) 90 tablet 2   clopidogrel (PLAVIX) 75 MG tablet Take 1 tablet (75 mg total) by mouth daily. 90 tablet 2   clopidogrel (PLAVIX) 75 MG tablet Take 1 tablet (75 mg total) by mouth daily. 90 tablet 2   clotrimazole-betamethasone (LOTRISONE)  cream Apply 2 times a day as needed 30 g 2   dapagliflozin propanediol (FARXIGA) 10 MG TABS tablet TAKE 1 TABLET BY MOUTH DAILY BEFORE BREAKFAST 90 tablet 3   Fluticasone-Umeclidin-Vilant (TRELEGY ELLIPTA) 200-62.5-25 MCG/INH AEPB Inhale 1 puff into the lungs daily as needed (wheezing/sob).     hydrOXYzine (ATARAX/VISTARIL) 25 MG tablet Take 1 tablet (25 mg total) by mouth 3 (three) times daily as needed for anxiety 90 tablet 2   LORazepam (ATIVAN) 0.5 MG tablet Take 1-2 tablets by mouth 30 - 60 min prior to MRI. Do not drive with this medicine. (Patient not taking: No sig reported) 4 tablet 0   nitroGLYCERIN (NITROSTAT) 0.4 MG SL tablet DISSOLVE 1 TABLET UNDER THE TONGUE AS NEEDED AS DIRECTED (Patient not taking: Reported on 05/19/2021) 25 tablet 5   prednisoLONE acetate (PRED FORTE) 1 % ophthalmic suspension APPLY ONE DROP IN OPERATIVE EYE(S) 3 TIMES DAILY STARTING 2 DAYS BEFORE SURGERY AND AFTER SURGERY FOR 3 WEEKS 5 mL 1   zolpidem (AMBIEN) 10 MG tablet Take 1 tablet by mouth every night at bedtime as needed for sleep 30 tablet 2   No current facility-administered medications for this visit.    Review of Systems Review of Systems Constitutional: negative for fatigue and weight loss Respiratory: negative for cough and wheezing Cardiovascular: negative for chest pain, fatigue and palpitations Gastrointestinal: positive for lower abdominal pain and negative for change in bowel habits Genitourinary:positive for pelvic pain and vaginal discharge; and pain with voiding Integument/breast: negative for nipple discharge Musculoskeletal:negative for myalgias Neurological: negative for gait problems and tremors Behavioral/Psych: negative for abusive relationship, depression Endocrine: negative for temperature intolerance      Blood pressure (!) 108/58, pulse (!) 41, height 5\' 1"  (1.549 m), weight 204 lb 8 oz (92.8 kg).  Physical Exam Physical Exam General:   alert and no distress  Skin:  no  rash or abnormalities  Lungs:   clear to auscultation bilaterally  Heart:   regular rate and rhythm, S1, S2 normal, no murmur, click, rub or gallop  Breasts:   Not examined  Abdomen:  Generalized tenderness to deep palpation.  no organomegaly, soft,  and no hernia  Pelvis:  External genitalia: normal general appearance Urinary system: urethral meatus normal and bladder without fullness, nontender Vaginal: normal without tenderness, induration or masses Cervix: normal appearance.  Positive CMT Adnexa: no masses but tender Uterus: anteverted and tender, normal size   I have spent a total of 20 minutes of face-to-face time, excluding clinical staff time, reviewing notes and preparing to see patient, ordering tests and/or medications, and counseling the patient.   Data Reviewed Wet Prep and Cultures Urinalysis  Assessment     1. Pelvic pain Rx: - ketorolac (TORADOL) injection 60 mg - US PELVIC COMPLETE WITH TRANSVAGINAL; Future - oxyCODONE-acetaminophen (PERCOCET) 5-325 MG tablet; Take 1 tablet by mouth every 4 (four) hours as needed for severe pain.  Dispense: 20 tablet; Refill: 0  2. Dysuria Rx: - POCT Urinalysis Dipstick - Urine Culture  3. Vaginal discharge Rx: - Cervicovaginal ancillary only  4. Glucose found in urine on examination Rx: - Hemoglobin A1c  5. Obesity (BMI 30-39.9) - weight loss with the aid of dietary changes, exercise and behavioral modification recommended    Plan    Orders Placed This Encounter  Procedures   Urine Culture   US PELVIC COMPLETE WITH TRANSVAGINAL    Standing Status:   Future    Standing Expiration Date:   05/19/2022    Order Specific Question:   Reason for Exam (SYMPTOM  OR DIAGNOSIS REQUIRED)    Answer:   Pelvic pain    Order Specific Question:   Preferred imaging location?    Answer:   [facility]   Hemoglobin A1c   POCT Urinalysis Dipstick   Meds ordered this encounter  Medications   ketorolac (TORADOL) injection 60  mg   oxyCODONE-acetaminophen (PERCOCET) 5-325 MG tablet    Sig: Take 1 tablet by mouth every 4 (four) hours as needed for severe pain.    Dispense:  20 tablet    Refill:  0       [name], MD 05/19/2021 12:31 PM

## 2021-05-19 NOTE — Progress Notes (Signed)
Pt here for abdominal pain

## 2021-05-20 ENCOUNTER — Other Ambulatory Visit (HOSPITAL_COMMUNITY): Payer: Self-pay

## 2021-05-20 ENCOUNTER — Other Ambulatory Visit: Payer: Self-pay | Admitting: Cardiovascular Disease

## 2021-05-20 LAB — CERVICOVAGINAL ANCILLARY ONLY
Bacterial Vaginitis (gardnerella): NEGATIVE
Candida Glabrata: NEGATIVE
Candida Vaginitis: NEGATIVE
Chlamydia: NEGATIVE
Comment: NEGATIVE
Comment: NEGATIVE
Comment: NEGATIVE
Comment: NEGATIVE
Comment: NEGATIVE
Comment: NORMAL
Neisseria Gonorrhea: NEGATIVE
Trichomonas: NEGATIVE

## 2021-05-20 LAB — HEMOGLOBIN A1C
Est. average glucose Bld gHb Est-mCnc: 137 mg/dL
Hgb A1c MFr Bld: 6.4 % — ABNORMAL HIGH (ref 4.8–5.6)

## 2021-05-20 NOTE — Progress Notes (Signed)
Paramedicine Encounter    Patient ID: Tracey Manning, female    DOB: 03-10-1961, 60 y.o.   MRN: [id number]  Met with [name] at home today for a medication pill box fill. Medications were picked up from [employer]. I reviewed medications and filled pill box accordingly. We reviewed upcoming appointments and confirmed same. Home visit scheduled for Monday July 4th.   Refills: Ambien  Hydroxyzine    ACTION: Home visit completed

## 2021-05-21 LAB — URINE CULTURE

## 2021-05-27 ENCOUNTER — Ambulatory Visit (HOSPITAL_COMMUNITY)
Admission: EM | Admit: 2021-05-27 | Discharge: 2021-05-27 | Disposition: A | Discharge status: Home / Self Care | Payer: Medicaid Other | Attending: Urgent Care | Admitting: Urgent Care

## 2021-05-27 ENCOUNTER — Other Ambulatory Visit: Payer: Self-pay

## 2021-05-27 ENCOUNTER — Encounter (HOSPITAL_COMMUNITY): Payer: Self-pay | Admitting: Emergency Medicine

## 2021-05-27 DIAGNOSIS — L299 Pruritus, unspecified: Secondary | ICD-10-CM | POA: Diagnosis not present

## 2021-05-27 DIAGNOSIS — R21 Rash and other nonspecific skin eruption: Secondary | ICD-10-CM

## 2021-05-27 DIAGNOSIS — L989 Disorder of the skin and subcutaneous tissue, unspecified: Secondary | ICD-10-CM

## 2021-05-27 MED ORDER — HYDROXYZINE HCL 25 MG PO TABS: 12.5000 mg | ORAL_TABLET | Freq: Three times a day (TID) | ORAL | 0 refills | Status: DC | PRN

## 2021-05-27 MED ORDER — TRIAMCINOLONE ACETONIDE 0.1 % EX CREA: 1.0000 "application " | TOPICAL_CREAM | Freq: Two times a day (BID) | CUTANEOUS | 0 refills | Status: AC

## 2021-05-27 MED ORDER — DIPHENHYDRAMINE HCL 50 MG/ML IJ SOLN
50.0000 mg | Freq: Once | INTRAMUSCULAR | Status: AC
  Administered 2021-05-27: 50 mg via INTRAMUSCULAR

## 2021-05-27 MED ORDER — DIPHENHYDRAMINE HCL 50 MG/ML IJ SOLN
INTRAMUSCULAR | Status: AC
  Filled 2021-05-27: qty 1

## 2021-05-27 NOTE — ED Triage Notes (Signed)
Pt presents with rash in multiple locations xs 3 days. States rash started after going swimming in friends pool.

## 2021-05-27 NOTE — ED Provider Notes (Signed)
[place]   MRN: [id number] DOB: [birth year]  Subjective:   Tracey Manning is a 60 y.o. female presenting for 3-day history of multiple itchy spots over the torso and face.  Patient thinks that it is related to getting in the pool over the weekend.  She did have trouble with a bedbug infestation last month which she thinks she got rid of.  Her symptoms were primarily itchy and has very little pain.  No drainage of pus or bleeding.  No current facility-administered medications for this encounter.  Current Outpatient Medications:    albuterol (PROVENTIL HFA) 108 (90 Base) MCG/ACT inhaler, Inhale 2 puffs by mouth every 4 hours as needed, Disp: 6.7 g, Rfl: 5   albuterol (PROVENTIL) (2.5 MG/3ML) 0.083% nebulizer solution, INHALE 1 VIAL VIA NEBULIZER EVERY 6 HOURS AS NEEDED (Patient taking differently: Take 2.5 mg by nebulization every 6 (six) hours as needed for wheezing or shortness of breath.), Disp: 90 mL, Rfl: 11   albuterol (VENTOLIN HFA) 108 (90 Base) MCG/ACT inhaler, INHALE 2 PUFFS EVERY 4 HOURS AS NEEDED (Patient taking differently: Inhale 2 puffs into the lungs every 4 (four) hours as needed for shortness of breath or wheezing.), Disp: 8.5 g, Rfl: 5   aspirin 81 MG EC tablet, Take 1 tablet (81 mg total) by mouth daily., Disp: 90 tablet, Rfl: 3   atorvastatin (LIPITOR) 80 MG tablet, TAKE 1 TABLET BY MOUTH ONCE A DAY, Disp: 90 tablet, Rfl: 3   atorvastatin (LIPITOR) 80 MG tablet, Take 1 tablet (80 mg total) by mouth daily., Disp: 90 tablet, Rfl: 2   benzonatate (TESSALON) 100 MG capsule, Take 1 capsule (100 mg total) by mouth 2 (two) times daily., Disp: 20 capsule, Rfl: 0   bisoprolol (ZEBETA) 5 MG tablet, Take 1 tablet (5 mg total) by mouth daily., Disp: 30 tablet, Rfl: 3   cetirizine (ZYRTEC ALLERGY) 10 MG tablet, Take 1 tablet (10 mg total) by mouth daily as needed, Disp: 90 tablet, Rfl: 2   cetirizine (ZYRTEC) 10 MG tablet, TAKE 1 TABLET BY MOUTH ONCE A DAY AS NEEDED  (Patient taking differently: Take 10 mg by mouth daily as needed for allergies.), Disp: 90 tablet, Rfl: 2   clopidogrel (PLAVIX) 75 MG tablet, Take 1 tablet (75 mg total) by mouth daily., Disp: 90 tablet, Rfl: 2   clopidogrel (PLAVIX) 75 MG tablet, Take 1 tablet (75 mg total) by mouth daily., Disp: 90 tablet, Rfl: 2   clotrimazole-betamethasone (LOTRISONE) cream, Apply 2 times a day as needed, Disp: 30 g, Rfl: 2   dapagliflozin propanediol (FARXIGA) 10 MG TABS tablet, TAKE 1 TABLET BY MOUTH DAILY BEFORE BREAKFAST, Disp: 90 tablet, Rfl: 3   Evolocumab 140 MG/ML SOAJ, INJECT 1 DOSE INTO THE SKIN EVERY 14 (FOURTEEN) DAYS., Disp: 2 mL, Rfl: 5   Fluticasone-Umeclidin-Vilant (TRELEGY ELLIPTA) 200-62.5-25 MCG/INH AEPB, Inhale 1 puff into the lungs daily as needed (wheezing/sob)., Disp: , Rfl:    furosemide (LASIX) 40 MG tablet, TAKE 1 TABLET BY MOUTH DAILY., Disp: 90 tablet, Rfl: 3   hydrOXYzine (ATARAX/VISTARIL) 25 MG tablet, Take 1 tablet (25 mg total) by mouth 3 (three) times daily as needed for anxiety, Disp: 90 tablet, Rfl: 2   hydrOXYzine (ATARAX/VISTARIL) 25 MG tablet, Take 1 tablet (25 mg total) by mouth 3 (three) times daily as needed for anxiety, Disp: 90 tablet, Rfl: 2   LORazepam (ATIVAN) 0.5 MG tablet, Take 1-2 tablets by mouth 30 - 60 min prior to MRI. Do not drive  with this medicine. (Patient not taking: No sig reported), Disp: 4 tablet, Rfl: 0   nitroGLYCERIN (NITROSTAT) 0.4 MG SL tablet, DISSOLVE 1 TABLET UNDER THE TONGUE AS NEEDED AS DIRECTED, Disp: 25 tablet, Rfl: 5   ofloxacin (OCUFLOX) 0.3 % ophthalmic solution, INSTILL 1 DROP 3 TIMES DAILY IN OPERATIVE EYE STARTING 2 DAYS PRIOR TO SURGERY AND AFTER SURGERY FOR 3 WEEKS., Disp: 5 mL, Rfl: 1   oxyCODONE-acetaminophen (PERCOCET) 5-325 MG tablet, Take 1 tablet by mouth every 4 (four) hours as needed for severe pain., Disp: 20 tablet, Rfl: 0   potassium chloride (KLOR-CON) 10 MEQ tablet, Take 2 tablets (20 mEq total) by mouth 2 (two) times  daily., Disp: 120 tablet, Rfl: 11   prednisoLONE acetate (PRED FORTE) 1 % ophthalmic suspension, APPLY ONE DROP IN OPERATIVE EYE(S) 3 TIMES DAILY STARTING 2 DAYS BEFORE SURGERY AND AFTER SURGERY FOR 3 WEEKS, Disp: 5 mL, Rfl: 1   pregabalin (LYRICA) 75 MG capsule, Take 1 capsule (75 mg total) by mouth 2 (two) times daily as needed (nerve pain)., Disp: 60 capsule, Rfl: 3   sacubitril-valsartan (ENTRESTO) 97-103 MG, Take 1 tablet by mouth 2 (two) times daily., Disp: 180 tablet, Rfl: 3   spironolactone (ALDACTONE) 25 MG tablet, TAKE 1 TABLET BY MOUTH DAILY, Disp: 30 tablet, Rfl: 3   Vitamin D, Ergocalciferol, (DRISDOL) 1.25 MG (50000 UNIT) CAPS capsule, TAKE 1 CAPSULE BY MOUTH ONCE A WEEK (Patient taking differently: Take 50,000 Units by mouth once a week.), Disp: 4 capsule, Rfl: 5   zolpidem (AMBIEN) 10 MG tablet, Take 1 tablet (10 mg total) by mouth at bedtime as needed for sleep, Disp: 30 tablet, Rfl: 0   zolpidem (AMBIEN) 10 MG tablet, Take 1 tablet by mouth every night at bedtime as needed for sleep, Disp: 30 tablet, Rfl: 2   Allergies  Allergen Reactions   Coreg [Carvedilol] Other (See Comments)    Headaches   Strawberry Extract Anaphylaxis, Hives, Swelling and Other (See Comments)    "Fresh strawberries" made the throat swell (2014)   Imdur [Isosorbide Nitrate] Other (See Comments)    Headaches    Amoxicillin Nausea And Vomiting    Did it involve swelling of the face/tongue/throat, SOB, or low BP? Yes Did it involve sudden or severe rash/hives, skin peeling, or any reaction on the inside of your mouth or nose? Yes Did you need to seek medical attention at a hospital or doctor's office? Yes When did it last happen?      within last 10 years If all above answers are "NO", may proceed with cephalosporin use.    Dilaudid [Hydromorphone Hcl] Nausea And Vomiting   Ibuprofen Other (See Comments)    Wheezing    Latex Swelling and Other (See Comments)    No reaction with Band-Aids, though    Metoprolol Other (See Comments)    Headaches and wheezing    Past Medical History:  Diagnosis Date   Anxiety    Arthritis    Asthma    Barrett's esophagus    Chest pain    Collagen vascular disease ([place])    Colon polyps    Coronary artery disease    Depression    Dyslipidemia    GERD (gastroesophageal reflux disease)    occ   Hyperlipidemia    Hypertension    Myocardial infarct ([place]) [id number],[id number]   Sciatica    Sleep apnea    no CPAP ordered but using oxygen at bedtime   Tobacco abuse  Past Surgical History:  Procedure Laterality Date   CARDIAC CATHETERIZATION  10/09/2003   [place]) - LAD with 30% prox narrowing, 50% stenosis in mid-portion of PLA; RCA with 40% narrowing proximally ([name], [name])   CARDIAC CATHETERIZATION  10/10/2007   70% stenosis in first septal perforator branch of LAD, 60-70% narrowing in mid LAD, 20% narrowing in mid AV groove Cfx with 80% diffuse narrowing in small distal marginal, total occlusion of mid RCA with L to R collaterals, 90% stenosis diffusely in prox branch of RCA followed by 70% stenosis in secondary curve & 80% stenosis in small marginal branch ([name])   CARDIAC CATHETERIZATION  05/28/2008   normal L main, RCA with 100% prox lesion w/distal filling from LAD collaterals, LAD with 20% prox tubular lesion/ 40% mid LAD lesion/previous stent patent ([name])   CARDIAC CATHETERIZATION  09/15/2009   discrete 100% osital RCA lesion, 50% prox LAD lesion, non-obstructive disease in all coronaries ([name])   [place] W/ POLYPECTOMY     CORONARY ANGIOPLASTY WITH STENT PLACEMENT  10/31/2007   PCI of distal Cfx with 2.25x38mm Taxus Adam DES, 60% narrowing of mid LAD ([name])   CORONARY ANGIOPLASTY WITH STENT PLACEMENT  06/11/2011   PCI of prox-mid LAD with 3x42mm DES Resolute ([name])   CORONARY STENT INTERVENTION N/A 06/19/2019   Procedure:  CORONARY STENT INTERVENTION;  Surgeon: [name], MD;  Location: [facility];  Service: Cardiovascular;  Laterality: N/A;  LAD   DILATION AND CURETTAGE, DIAGNOSTIC / THERAPEUTIC     DILITATION & CURRETTAGE/HYSTROSCOPY WITH HYDROTHERMAL ABLATION N/A 04/03/2016   Procedure: DILATATION & CURETTAGE/HYSTEROSCOPY WITH HYDROTHERMAL ABLATION;  Surgeon: [name], MD;  Location: [facility];  Service: Gynecology;  Laterality: N/A;   NM MYOCAR PERF WALL MOTION  08/2009   persantine myoview - normal perfusion in all regions, perfusion defect in anterior region (breast attenuation), EF 52%, low risk scan   RIGHT/LEFT HEART CATH AND CORONARY ANGIOGRAPHY N/A 06/15/2019   Procedure: RIGHT/LEFT HEART CATH AND CORONARY ANGIOGRAPHY;  Surgeon: Belva Crome, MD;  Location: [facility];  Service: Cardiovascular;  Laterality: N/A;    Family History  Problem Relation Age of Onset   Leukemia Mother    Clotting disorder Father        blood clot   Hypertension Sister    Diabetes Sister    Stroke Sister 72   Bleeding Disorder Son        ITP "free bleeding disorder"   Colon cancer Paternal Grandmother    Diabetes Paternal Grandmother    Stomach cancer Neg Hx    Pancreatic cancer Neg Hx     Social History   Tobacco Use   Smoking status: Every Day    Packs/day: 0.50    Years: 34.00    Pack years: 17.00    Types: Cigarettes   Smokeless tobacco: Never  Vaping Use   Vaping Use: Never used  Substance Use Topics   Alcohol use: No    Alcohol/week: 0.0 standard drinks    Comment: seldom   Drug use: Yes    Types: Marijuana    Comment: 06/19/19    ROS   Objective:   Vitals: BP (!) 102/45 (BP Location: Right Arm)   Pulse (!) 46   Temp 97.9 F (36.6 C) (Oral)   Resp 18   LMP  (  LMP Unknown)   SpO2 98%   Wt Readings from Last 3 Encounters:  05/19/21 204 lb 8 oz (92.8 kg)  05/12/21 200 lb (90.7 kg)  05/05/21 201 lb (91.2 kg)   Temp Readings from Last 3 Encounters:   05/27/21 97.9 F (36.6 C) (Oral)  03/12/21 98.3 F (36.8 C) (Oral)  02/24/21 98.2 F (36.8 C) (Oral)   BP Readings from Last 3 Encounters:  05/27/21 (!) 102/45  05/19/21 (!) 108/58  05/12/21 130/62   Pulse Readings from Last 3 Encounters:  05/27/21 (!) 46  05/19/21 (!) 41  05/12/21 60   Physical Exam Constitutional:      General: She is not in acute distress.    Appearance: Normal appearance. She is well-developed. She is not ill-appearing, toxic-appearing or diaphoretic.  HENT:     Head: Normocephalic and atraumatic.     Nose: Nose normal.     Mouth/Throat:     Mouth: Mucous membranes are moist.     Pharynx: Oropharynx is clear.  Eyes:     General: No scleral icterus.    Extraocular Movements: Extraocular movements intact.     Pupils: Pupils are equal, round, and reactive to light.  Cardiovascular:     Rate and Rhythm: Normal rate.  Pulmonary:     Effort: Pulmonary effort is normal.  Skin:    General: Skin is warm and dry.     Findings: Rash (Multiple solitary erythematous nodules ~1/2cm in size scantly scattered over the mid abdomen extending into the mid chest and to a lesser degree over the ankles bilaterally) present.  Neurological:     General: No focal deficit present.     Mental Status: She is alert and oriented to person, place, and time.  Psychiatric:        Mood and Affect: Mood normal.        Behavior: Behavior normal.    Assessment and Plan :   PDMP not reviewed this encounter.  1. Rash and nonspecific skin eruption   2. Itching   3. Skin lesion     Do not suspect the patient reacted to the water in the pool.  Counseled that I suspect a contact dermatitis or continued bedbug lesions.  Recommended she recheck for this in her home.  In the meantime use triamcinolone, hydroxyzine.  IM Benadryl given in clinic. Counseled patient on potential for adverse effects with medications prescribed/recommended today, ER and return-to-clinic precautions  discussed, patient verbalized understanding.    Jaynee Eagles, [place] 05/27/21 1537

## 2021-05-29 ENCOUNTER — Inpatient Hospital Stay: Admission: RE | Admit: 2021-05-29 | Payer: Medicaid Other | Source: Ambulatory Visit

## 2021-05-30 ENCOUNTER — Ambulatory Visit (HOSPITAL_BASED_OUTPATIENT_CLINIC_OR_DEPARTMENT_OTHER): Payer: Medicaid Other

## 2021-06-02 ENCOUNTER — Ambulatory Visit (HOSPITAL_BASED_OUTPATIENT_CLINIC_OR_DEPARTMENT_OTHER)
Admission: RE | Admit: 2021-06-02 | Discharge: 2021-06-02 | Disposition: A | Discharge status: Home / Self Care | Payer: Medicaid Other | Source: Ambulatory Visit | Attending: Obstetrics | Admitting: Obstetrics

## 2021-06-02 ENCOUNTER — Telehealth: Payer: Medicaid Other | Admitting: Obstetrics

## 2021-06-02 ENCOUNTER — Other Ambulatory Visit: Payer: Self-pay

## 2021-06-02 DIAGNOSIS — R102 Pelvic and perineal pain: Secondary | ICD-10-CM | POA: Diagnosis present

## 2021-06-04 ENCOUNTER — Other Ambulatory Visit (HOSPITAL_COMMUNITY): Payer: Self-pay

## 2021-06-04 NOTE — Progress Notes (Signed)
Paramedicine Encounter    Patient ID: Tracey Manning, female    DOB: 1961-01-02, 60 y.o.   MRN: [id number]   Patient Care Team: Nolene Ebbs, MD as PCP - General (Internal Medicine) [name], MD as PCP - Cardiology (Cardiology) Bensimhon, Shaune Pascal, MD as PCP - Advanced Heart Failure (Cardiology) Jorge Ny, LCSW as Social Worker (Licensed Clinical Social Worker)  Patient Active Problem List   Diagnosis Date Noted   Acute on chronic combined systolic (congestive) and diastolic (congestive) heart failure ([place]) 03/10/2021   Acute respiratory failure with hypoxia ([place]) 02/23/2021   Impingement syndrome of left shoulder 03/12/2020   Chronic pain 07/18/2019   CRI (chronic renal insufficiency), stage 3 (moderate) 07/18/2019   COPD (chronic obstructive pulmonary disease) ([place]) 06/28/2019   Obesity (BMI 30-39.9) 06/28/2019   Ischemic cardiomyopathy 06/28/2019   Dyspnea on exertion 19/50/9326   Acute systolic CHF (congestive heart failure) ([place])    AKI (acute kidney injury) ([place])    Abnormal uterine bleeding (AUB) 04/03/2016   Dysfunctional uterine bleeding 11/14/2015   Bacterial vaginosis (recurrent) 11/14/2015   Atypical chest pain 11/14/2015   Essential hypertension 04/04/2014   Dyslipidemia, goal LDL below 70 04/04/2014   CAD S/P multiple PCIs 04/04/2014    Current Outpatient Medications:    albuterol (PROVENTIL HFA) 108 (90 Base) MCG/ACT inhaler, Inhale 2 puffs by mouth every 4 hours as needed, Disp: 6.7 g, Rfl: 5   albuterol (PROVENTIL) (2.5 MG/3ML) 0.083% nebulizer solution, INHALE 1 VIAL VIA NEBULIZER EVERY 6 HOURS AS NEEDED (Patient taking differently: Take 2.5 mg by nebulization every 6 (six) hours as needed for wheezing or shortness of breath.), Disp: 90 mL, Rfl: 11   albuterol (VENTOLIN HFA) 108 (90 Base) MCG/ACT inhaler, INHALE 2 PUFFS EVERY 4 HOURS AS NEEDED (Patient taking differently: Inhale 2 puffs into the lungs every 4 (four) hours as needed for shortness of  breath or wheezing.), Disp: 8.5 g, Rfl: 5   aspirin 81 MG EC tablet, Take 1 tablet (81 mg total) by mouth daily., Disp: 90 tablet, Rfl: 3   atorvastatin (LIPITOR) 80 MG tablet, TAKE 1 TABLET BY MOUTH ONCE A DAY, Disp: 90 tablet, Rfl: 3   atorvastatin (LIPITOR) 80 MG tablet, Take 1 tablet (80 mg total) by mouth daily., Disp: 90 tablet, Rfl: 2   benzonatate (TESSALON) 100 MG capsule, Take 1 capsule (100 mg total) by mouth 2 (two) times daily., Disp: 20 capsule, Rfl: 0   bisoprolol (ZEBETA) 5 MG tablet, Take 1 tablet (5 mg total) by mouth daily., Disp: 30 tablet, Rfl: 3   cetirizine (ZYRTEC ALLERGY) 10 MG tablet, Take 1 tablet (10 mg total) by mouth daily as needed, Disp: 90 tablet, Rfl: 2   cetirizine (ZYRTEC) 10 MG tablet, TAKE 1 TABLET BY MOUTH ONCE A DAY AS NEEDED (Patient taking differently: Take 10 mg by mouth daily as needed for allergies.), Disp: 90 tablet, Rfl: 2   clopidogrel (PLAVIX) 75 MG tablet, Take 1 tablet (75 mg total) by mouth daily., Disp: 90 tablet, Rfl: 2   clopidogrel (PLAVIX) 75 MG tablet, Take 1 tablet (75 mg total) by mouth daily., Disp: 90 tablet, Rfl: 2   clotrimazole-betamethasone (LOTRISONE) cream, Apply 2 times a day as needed, Disp: 30 g, Rfl: 2   dapagliflozin propanediol (FARXIGA) 10 MG TABS tablet, TAKE 1 TABLET BY MOUTH DAILY BEFORE BREAKFAST, Disp: 90 tablet, Rfl: 3   Evolocumab 140 MG/ML SOAJ, INJECT 1 DOSE INTO THE SKIN EVERY 14 (FOURTEEN) DAYS., Disp: 2 mL, Rfl:  5   Fluticasone-Umeclidin-Vilant (TRELEGY ELLIPTA) 200-62.5-25 MCG/INH AEPB, Inhale 1 puff into the lungs daily as needed (wheezing/sob)., Disp: , Rfl:    furosemide (LASIX) 40 MG tablet, TAKE 1 TABLET BY MOUTH DAILY., Disp: 90 tablet, Rfl: 3   hydrOXYzine (ATARAX/VISTARIL) 25 MG tablet, Take 0.5-1 tablets (12.5-25 mg total) by mouth every 8 (eight) hours as needed for itching., Disp: 30 tablet, Rfl: 0   LORazepam (ATIVAN) 0.5 MG tablet, Take 1-2 tablets by mouth 30 - 60 min prior to MRI. Do not drive with  this medicine. (Patient not taking: No sig reported), Disp: 4 tablet, Rfl: 0   nitroGLYCERIN (NITROSTAT) 0.4 MG SL tablet, DISSOLVE 1 TABLET UNDER THE TONGUE AS NEEDED AS DIRECTED, Disp: 25 tablet, Rfl: 5   ofloxacin (OCUFLOX) 0.3 % ophthalmic solution, INSTILL 1 DROP 3 TIMES DAILY IN OPERATIVE EYE STARTING 2 DAYS PRIOR TO SURGERY AND AFTER SURGERY FOR 3 WEEKS., Disp: 5 mL, Rfl: 1   oxyCODONE-acetaminophen (PERCOCET) 5-325 MG tablet, Take 1 tablet by mouth every 4 (four) hours as needed for severe pain., Disp: 20 tablet, Rfl: 0   potassium chloride (KLOR-CON) 10 MEQ tablet, Take 2 tablets (20 mEq total) by mouth 2 (two) times daily., Disp: 120 tablet, Rfl: 11   prednisoLONE acetate (PRED FORTE) 1 % ophthalmic suspension, APPLY ONE DROP IN OPERATIVE EYE(S) 3 TIMES DAILY STARTING 2 DAYS BEFORE SURGERY AND AFTER SURGERY FOR 3 WEEKS, Disp: 5 mL, Rfl: 1   pregabalin (LYRICA) 75 MG capsule, Take 1 capsule (75 mg total) by mouth 2 (two) times daily as needed (nerve pain)., Disp: 60 capsule, Rfl: 3   sacubitril-valsartan (ENTRESTO) 97-103 MG, Take 1 tablet by mouth 2 (two) times daily., Disp: 180 tablet, Rfl: 3   spironolactone (ALDACTONE) 25 MG tablet, TAKE 1 TABLET BY MOUTH DAILY, Disp: 30 tablet, Rfl: 3   triamcinolone cream (KENALOG) 0.1 %, Apply 1 application topically 2 (two) times daily., Disp: 60 g, Rfl: 0   Vitamin D, Ergocalciferol, (DRISDOL) 1.25 MG (50000 UNIT) CAPS capsule, TAKE 1 CAPSULE BY MOUTH ONCE A WEEK (Patient taking differently: Take 50,000 Units by mouth once a week.), Disp: 4 capsule, Rfl: 5   zolpidem (AMBIEN) 10 MG tablet, Take 1 tablet (10 mg total) by mouth at bedtime as needed for sleep, Disp: 30 tablet, Rfl: 0   zolpidem (AMBIEN) 10 MG tablet, Take 1 tablet by mouth every night at bedtime as needed for sleep, Disp: 30 tablet, Rfl: 2 Allergies  Allergen Reactions   Coreg [Carvedilol] Other (See Comments)    Headaches   Strawberry Extract Anaphylaxis, Hives, Swelling and Other  (See Comments)    "Fresh strawberries" made the throat swell (2014)   Imdur [Isosorbide Nitrate] Other (See Comments)    Headaches    Amoxicillin Nausea And Vomiting    Did it involve swelling of the face/tongue/throat, SOB, or low BP? Yes Did it involve sudden or severe rash/hives, skin peeling, or any reaction on the inside of your mouth or nose? Yes Did you need to seek medical attention at a hospital or doctor's office? Yes When did it last happen?      within last 10 years If all above answers are "NO", may proceed with cephalosporin use.    Dilaudid [Hydromorphone Hcl] Nausea And Vomiting   Ibuprofen Other (See Comments)    Wheezing    Latex Swelling and Other (See Comments)    No reaction with Band-Aids, though   Metoprolol Other (See Comments)    Headaches and  wheezing     Social History   Socioeconomic History   Marital status: Single    Spouse name: Not on file   Number of children: 2   Years of education: 12   Highest education level: High school graduate  Occupational History   Not on file  Tobacco Use   Smoking status: Every Day    Packs/day: 0.50    Years: 34.00    Pack years: 17.00    Types: Cigarettes   Smokeless tobacco: Never  Vaping Use   Vaping Use: Never used  Substance and Sexual Activity   Alcohol use: No    Alcohol/week: 0.0 standard drinks    Comment: seldom   Drug use: Yes    Types: Marijuana    Comment: 06/19/19   Sexual activity: Yes    Partners: Male    Birth control/protection: Post-menopausal  Other Topics Concern   Not on file  Social History Narrative   Not on file   Social Determinants of Health   Financial Resource Strain: Medium Risk   Difficulty of Paying Living Expenses: Somewhat hard  Food Insecurity: Food Insecurity Present   Worried About Charity fundraiser in the Last Year: Sometimes true   Arboriculturist in the Last Year: Sometimes true  Transportation Needs: Unmet Transportation Needs   Lack of  Transportation (Medical): No   Lack of Transportation (Non-Medical): Yes  Physical Activity: Not on file  Stress: Not on file  Social Connections: Not on file  Intimate Partner Violence: Not on file    Physical Exam Vitals reviewed.  Constitutional:      Appearance: Normal appearance. She is normal weight.  HENT:     Head: Normocephalic.     Nose: Nose normal.     Mouth/Throat:     Mouth: Mucous membranes are moist.     Pharynx: Oropharynx is clear.  Eyes:     Conjunctiva/sclera: Conjunctivae normal.     Pupils: Pupils are equal, round, and reactive to light.  Cardiovascular:     Rate and Rhythm: Normal rate and regular rhythm.  Pulmonary:     Effort: Pulmonary effort is normal.     Breath sounds: Normal breath sounds.  Abdominal:     General: Abdomen is flat.     Palpations: Abdomen is soft.  Musculoskeletal:        General: Normal range of motion.     Cervical back: Normal range of motion.     Right lower leg: No edema.     Left lower leg: No edema.  Skin:    General: Skin is warm and dry.     Capillary Refill: Capillary refill takes less than 2 seconds.  Neurological:     General: No focal deficit present.     Mental Status: She is alert. Mental status is at baseline.  Psychiatric:        Mood and Affect: Mood normal.    Arrived for home visit for Tracey Manning who was alert and oriented reporting to be feeling okay just sore in her thigh muscles from walking a lot yesterday. Tracey Manning stated she has been compliant with her medications and has had no issues.   Vitals & assessment obtained and as noted. No edema, lung sounds clear, no complaints of shortness of breath, dizziness or chest pain.   Meds reviewed and confirmed. Pill box filled accordingly. (Missing hydroxyzine and ambien)   Plavix and Aspirin held from 17th to the 22nd due to upcoming procedure.  Appointments reviewed, I will see Tracey Manning in one week.   Refills: Bisoprolol Hydroxyzine Ambien Repatha      Future Appointments  Date Time Provider [place]  06/06/2021 11:15 AM [name], MD [place] None  06/26/2021  1:40 PM Bensimhon, Shaune Pascal, MD [facility] None  08/06/2021 11:15 AM Hilty, Nadean Corwin, MD [facility]     ACTION: Home visit completed

## 2021-06-06 ENCOUNTER — Other Ambulatory Visit (HOSPITAL_COMMUNITY): Payer: Self-pay

## 2021-06-06 ENCOUNTER — Telehealth (INDEPENDENT_AMBULATORY_CARE_PROVIDER_SITE_OTHER): Payer: Medicaid Other | Admitting: Obstetrics

## 2021-06-06 DIAGNOSIS — R300 Dysuria: Secondary | ICD-10-CM | POA: Diagnosis not present

## 2021-06-06 DIAGNOSIS — N898 Other specified noninflammatory disorders of vagina: Secondary | ICD-10-CM

## 2021-06-06 DIAGNOSIS — R102 Pelvic and perineal pain: Secondary | ICD-10-CM | POA: Diagnosis not present

## 2021-06-06 NOTE — Progress Notes (Signed)
S/w pt for virtual visit for follow up after visit on 05-19-21.  Pt reports still having the same issues.

## 2021-06-11 ENCOUNTER — Other Ambulatory Visit (HOSPITAL_COMMUNITY): Payer: Self-pay

## 2021-06-11 NOTE — Progress Notes (Signed)
Paramedicine Encounter    Patient ID: Tracey Manning, female    DOB: May 15, 1961, 60 y.o.   MRN: [id number]   Patient Care Team: Nolene Ebbs, MD as PCP - General (Internal Medicine) [name], MD as PCP - Cardiology (Cardiology) Bensimhon, Shaune Pascal, MD as PCP - Advanced Heart Failure (Cardiology) Jorge Ny, LCSW as Social Worker (Licensed Clinical Social Worker)  Patient Active Problem List   Diagnosis Date Noted   Acute on chronic combined systolic (congestive) and diastolic (congestive) heart failure ([place]) 03/10/2021   Acute respiratory failure with hypoxia ([place]) 02/23/2021   Impingement syndrome of left shoulder 03/12/2020   Chronic pain 07/18/2019   CRI (chronic renal insufficiency), stage 3 (moderate) 07/18/2019   COPD (chronic obstructive pulmonary disease) ([place]) 06/28/2019   Obesity (BMI 30-39.9) 06/28/2019   Ischemic cardiomyopathy 06/28/2019   Dyspnea on exertion 13/06/6577   Acute systolic CHF (congestive heart failure) ([place])    AKI (acute kidney injury) ([place])    Abnormal uterine bleeding (AUB) 04/03/2016   Dysfunctional uterine bleeding 11/14/2015   Bacterial vaginosis (recurrent) 11/14/2015   Atypical chest pain 11/14/2015   Essential hypertension 04/04/2014   Dyslipidemia, goal LDL below 70 04/04/2014   CAD S/P multiple PCIs 04/04/2014    Current Outpatient Medications:    albuterol (PROVENTIL HFA) 108 (90 Base) MCG/ACT inhaler, Inhale 2 puffs by mouth every 4 hours as needed, Disp: 6.7 g, Rfl: 5   albuterol (PROVENTIL) (2.5 MG/3ML) 0.083% nebulizer solution, INHALE 1 VIAL VIA NEBULIZER EVERY 6 HOURS AS NEEDED (Patient taking differently: Take 2.5 mg by nebulization every 6 (six) hours as needed for wheezing or shortness of breath.), Disp: 90 mL, Rfl: 11   albuterol (VENTOLIN HFA) 108 (90 Base) MCG/ACT inhaler, INHALE 2 PUFFS EVERY 4 HOURS AS NEEDED (Patient taking differently: Inhale 2 puffs into the lungs every 4 (four) hours as needed for shortness of  breath or wheezing.), Disp: 8.5 g, Rfl: 5   aspirin 81 MG EC tablet, Take 1 tablet (81 mg total) by mouth daily., Disp: 90 tablet, Rfl: 3   atorvastatin (LIPITOR) 80 MG tablet, TAKE 1 TABLET BY MOUTH ONCE A DAY, Disp: 90 tablet, Rfl: 3   atorvastatin (LIPITOR) 80 MG tablet, Take 1 tablet (80 mg total) by mouth daily., Disp: 90 tablet, Rfl: 2   benzonatate (TESSALON) 100 MG capsule, Take 1 capsule (100 mg total) by mouth 2 (two) times daily., Disp: 20 capsule, Rfl: 0   bisoprolol (ZEBETA) 5 MG tablet, Take 1 tablet (5 mg total) by mouth daily., Disp: 30 tablet, Rfl: 3   cetirizine (ZYRTEC ALLERGY) 10 MG tablet, Take 1 tablet (10 mg total) by mouth daily as needed, Disp: 90 tablet, Rfl: 2   cetirizine (ZYRTEC) 10 MG tablet, TAKE 1 TABLET BY MOUTH ONCE A DAY AS NEEDED (Patient taking differently: Take 10 mg by mouth daily as needed for allergies.), Disp: 90 tablet, Rfl: 2   clopidogrel (PLAVIX) 75 MG tablet, Take 1 tablet (75 mg total) by mouth daily., Disp: 90 tablet, Rfl: 2   clopidogrel (PLAVIX) 75 MG tablet, Take 1 tablet (75 mg total) by mouth daily., Disp: 90 tablet, Rfl: 2   clotrimazole-betamethasone (LOTRISONE) cream, Apply 2 times a day as needed, Disp: 30 g, Rfl: 2   dapagliflozin propanediol (FARXIGA) 10 MG TABS tablet, TAKE 1 TABLET BY MOUTH DAILY BEFORE BREAKFAST, Disp: 90 tablet, Rfl: 3   Evolocumab 140 MG/ML SOAJ, INJECT 1 DOSE INTO THE SKIN EVERY 14 (FOURTEEN) DAYS., Disp: 2 mL, Rfl:  5   Fluticasone-Umeclidin-Vilant (TRELEGY ELLIPTA) 200-62.5-25 MCG/INH AEPB, Inhale 1 puff into the lungs daily as needed (wheezing/sob)., Disp: , Rfl:    furosemide (LASIX) 40 MG tablet, TAKE 1 TABLET BY MOUTH DAILY., Disp: 90 tablet, Rfl: 3   hydrOXYzine (ATARAX/VISTARIL) 25 MG tablet, Take 0.5-1 tablets (12.5-25 mg total) by mouth every 8 (eight) hours as needed for itching., Disp: 30 tablet, Rfl: 0   LORazepam (ATIVAN) 0.5 MG tablet, Take 1-2 tablets by mouth 30 - 60 min prior to MRI. Do not drive with  this medicine. (Patient not taking: Reported on 06/04/2021), Disp: 4 tablet, Rfl: 0   nitroGLYCERIN (NITROSTAT) 0.4 MG SL tablet, DISSOLVE 1 TABLET UNDER THE TONGUE AS NEEDED AS DIRECTED, Disp: 25 tablet, Rfl: 5   ofloxacin (OCUFLOX) 0.3 % ophthalmic solution, INSTILL 1 DROP 3 TIMES DAILY IN OPERATIVE EYE STARTING 2 DAYS PRIOR TO SURGERY AND AFTER SURGERY FOR 3 WEEKS., Disp: 5 mL, Rfl: 1   oxyCODONE-acetaminophen (PERCOCET) 5-325 MG tablet, Take 1 tablet by mouth every 4 (four) hours as needed for severe pain., Disp: 20 tablet, Rfl: 0   potassium chloride (KLOR-CON) 10 MEQ tablet, Take 2 tablets (20 mEq total) by mouth 2 (two) times daily., Disp: 120 tablet, Rfl: 11   prednisoLONE acetate (PRED FORTE) 1 % ophthalmic suspension, APPLY ONE DROP IN OPERATIVE EYE(S) 3 TIMES DAILY STARTING 2 DAYS BEFORE SURGERY AND AFTER SURGERY FOR 3 WEEKS, Disp: 5 mL, Rfl: 1   pregabalin (LYRICA) 75 MG capsule, Take 1 capsule (75 mg total) by mouth 2 (two) times daily as needed (nerve pain)., Disp: 60 capsule, Rfl: 3   sacubitril-valsartan (ENTRESTO) 97-103 MG, Take 1 tablet by mouth 2 (two) times daily., Disp: 180 tablet, Rfl: 3   spironolactone (ALDACTONE) 25 MG tablet, TAKE 1 TABLET BY MOUTH DAILY, Disp: 30 tablet, Rfl: 3   triamcinolone cream (KENALOG) 0.1 %, Apply 1 application topically 2 (two) times daily., Disp: 60 g, Rfl: 0   Vitamin D, Ergocalciferol, (DRISDOL) 1.25 MG (50000 UNIT) CAPS capsule, TAKE 1 CAPSULE BY MOUTH ONCE A WEEK (Patient taking differently: Take 50,000 Units by mouth once a week.), Disp: 4 capsule, Rfl: 5   zolpidem (AMBIEN) 10 MG tablet, Take 1 tablet (10 mg total) by mouth at bedtime as needed for sleep, Disp: 30 tablet, Rfl: 0   zolpidem (AMBIEN) 10 MG tablet, Take 1 tablet by mouth every night at bedtime as needed for sleep, Disp: 30 tablet, Rfl: 2 Allergies  Allergen Reactions   Coreg [Carvedilol] Other (See Comments)    Headaches   Strawberry Extract Anaphylaxis, Hives, Swelling and  Other (See Comments)    "Fresh strawberries" made the throat swell (2014)   Imdur [Isosorbide Nitrate] Other (See Comments)    Headaches    Amoxicillin Nausea And Vomiting    Did it involve swelling of the face/tongue/throat, SOB, or low BP? Yes Did it involve sudden or severe rash/hives, skin peeling, or any reaction on the inside of your mouth or nose? Yes Did you need to seek medical attention at a hospital or doctor's office? Yes When did it last happen?      within last 10 years If all above answers are "NO", may proceed with cephalosporin use.    Dilaudid [Hydromorphone Hcl] Nausea And Vomiting   Ibuprofen Other (See Comments)    Wheezing    Latex Swelling and Other (See Comments)    No reaction with Band-Aids, though   Metoprolol Other (See Comments)    Headaches and  wheezing     Social History   Socioeconomic History   Marital status: Single    Spouse name: Not on file   Number of children: 2   Years of education: 12   Highest education level: High school graduate  Occupational History   Not on file  Tobacco Use   Smoking status: Every Day    Packs/day: 0.50    Years: 34.00    Pack years: 17.00    Types: Cigarettes   Smokeless tobacco: Never  Vaping Use   Vaping Use: Never used  Substance and Sexual Activity   Alcohol use: No    Alcohol/week: 0.0 standard drinks    Comment: seldom   Drug use: Yes    Types: Marijuana    Comment: 06/19/19   Sexual activity: Yes    Partners: Male    Birth control/protection: Post-menopausal  Other Topics Concern   Not on file  Social History Narrative   Not on file   Social Determinants of Health   Financial Resource Strain: Medium Risk   Difficulty of Paying Living Expenses: Somewhat hard  Food Insecurity: Food Insecurity Present   Worried About Charity fundraiser in the Last Year: Sometimes true   Arboriculturist in the Last Year: Sometimes true  Transportation Needs: Unmet Transportation Needs   Lack of  Transportation (Medical): No   Lack of Transportation (Non-Medical): Yes  Physical Activity: Not on file  Stress: Not on file  Social Connections: Not on file  Intimate Partner Violence: Not on file    Physical Exam Vitals reviewed.  Constitutional:      Appearance: She is normal weight.  HENT:     Head: Normocephalic.     Nose: Nose normal.     Mouth/Throat:     Mouth: Mucous membranes are moist.     Pharynx: Oropharynx is clear.  Eyes:     Conjunctiva/sclera: Conjunctivae normal.     Pupils: Pupils are equal, round, and reactive to light.  Cardiovascular:     Rate and Rhythm: Normal rate and regular rhythm.     Pulses: Normal pulses.     Heart sounds: Normal heart sounds.  Pulmonary:     Effort: Pulmonary effort is normal.     Breath sounds: Normal breath sounds.  Abdominal:     General: Abdomen is flat.     Palpations: Abdomen is soft.  Musculoskeletal:        General: Normal range of motion.     Cervical back: Normal range of motion.     Right lower leg: No edema.     Left lower leg: No edema.  Skin:    General: Skin is warm and dry.     Capillary Refill: Capillary refill takes less than 2 seconds.  Neurological:     General: No focal deficit present.     Mental Status: She is alert. Mental status is at baseline.  Psychiatric:        Mood and Affect: Mood normal.   Arrived for home visit for Tracey Manning who was alert and oriented reporting to be feeling good today. Tracey Manning stated that she has been compliant with her medications and denied chest pain, shortness of breath, dizziness or trouble sleeping.   I obtained vitals and assessment as noted.   We reviewed medications filling pill box accordingly.   No missing medications.   Appointments reviewed and confirmed. I plan to see Tracey Manning in the home in one week.  Refills: (called in to [facility] and set for delivery on Monday)  Lasix Farxiga Vitamin D Pregabalin Spironolactone Entresto      Future  Appointments  Date Time Provider [place]  06/13/2021  2:00 PM [facility] C-[facility] RM 3 [facility]  06/26/2021  1:40 PM Bensimhon, Shaune Pascal, MD [facility] None  08/06/2021 11:15 AM Hilty, Nadean Corwin, MD [facility]     ACTION: Home visit completed

## 2021-06-13 ENCOUNTER — Ambulatory Visit
Admission: RE | Admit: 2021-06-13 | Discharge: 2021-06-13 | Disposition: A | Discharge status: Home / Self Care | Payer: Medicaid Other | Source: Ambulatory Visit | Attending: Family Medicine | Admitting: Family Medicine

## 2021-06-13 DIAGNOSIS — M48062 Spinal stenosis, lumbar region with neurogenic claudication: Secondary | ICD-10-CM

## 2021-06-13 MED ORDER — METHYLPREDNISOLONE ACETATE 40 MG/ML INJ SUSP (RADIOLOG
80.0000 mg | Freq: Once | INTRAMUSCULAR | Status: AC
  Administered 2021-06-13: 80 mg via EPIDURAL

## 2021-06-13 MED ORDER — IOPAMIDOL (ISOVUE-M 200) INJECTION 41%
1.0000 mL | Freq: Once | INTRAMUSCULAR | Status: AC
  Administered 2021-06-13: 1 mL via EPIDURAL

## 2021-06-13 NOTE — Discharge Instructions (Signed)
Post Procedure Spinal Discharge Instruction Sheet  You may resume a regular diet and any medications that you routinely take (including pain medications) unless otherwise noted by MD.  No driving day of procedure.  Light activity throughout the rest of the day.  Do not do any strenuous work, exercise, bending or lifting.  The day following the procedure, you can resume normal physical activity but you should refrain from exercising or physical therapy for at least three days thereafter.  You may apply ice to the injection site, 20 minutes on, 20 minutes off, as needed. Do not apply ice directly to skin.    Common Side Effects:  Headaches- take your usual medications as directed by your physician.  Increase your fluid intake.  Caffeinated beverages may be helpful.  Lie flat in bed until your headache resolves.  Restlessness or inability to sleep- you may have trouble sleeping for the next few days.  Ask your referring physician if you need any medication for sleep.  Facial flushing or redness- should subside within a few days.  Increased pain- a temporary increase in pain a day or two following your procedure is not unusual.  Take your pain medication as prescribed by your referring physician.  Leg cramps  Please contact our office at [phone] for the following symptoms: Fever greater than 100 degrees. Headaches unresolved with medication after 2-3 days. Increased swelling, pain, or redness at injection site.   Thank you for visiting [facility] today.    MAY RESUME PLAVIX POST PROCEDURE TODAY.

## 2021-06-18 ENCOUNTER — Telehealth (HOSPITAL_COMMUNITY): Payer: Self-pay

## 2021-06-18 NOTE — Telephone Encounter (Signed)
I called Ms Byer to let her know that Nira Conn would be out of the office this week and see if there was anything I could assist with over the phone. She did not answer so I left a voicemail with my information and requested she call me back.   Jacquiline Doe, EMT 06/18/21

## 2021-06-20 ENCOUNTER — Ambulatory Visit: Payer: Self-pay

## 2021-06-20 NOTE — Telephone Encounter (Signed)
Patient called in to say that she is not feeling well but not sure if its Covid. Does have cough, sore throat, scratchy throat, sinus drainage, chill cold and hot, very sweaty and have sinus congestion.  Ph# [phone]  Symptoms started Wednesday - runny nose,productive cough,chills. Tried to do home COVID 19 test. "I couldn't get it to work." Has called her PCP and waiting for a call back. Answer Assessment - Initial Assessment Questions 1. ONSET: "When did the cough begin?"      Wednesday 2. SEVERITY: "How bad is the cough today?"      Moderate 3. SPUTUM: "Describe the color of your sputum" (none, dry cough; clear, white, yellow, green)     White 4. HEMOPTYSIS: "Are you coughing up any blood?" If so ask: "How much?" (flecks, streaks, tablespoons, etc.)     No 5. DIFFICULTY BREATHING: "Are you having difficulty breathing?" If Yes, ask: "How bad is it?" (e.g., mild, moderate, severe)    - MILD: No SOB at rest, mild SOB with walking, speaks normally in sentences, can lie down, no retractions, pulse < 100.    - MODERATE: SOB at rest, SOB with minimal exertion and prefers to sit, cannot lie down flat, speaks in phrases, mild retractions, audible wheezing, pulse 100-120.    - SEVERE: Very SOB at rest, speaks in single words, struggling to breathe, sitting hunched forward, retractions, pulse > 120      No 6. FEVER: "Do you have a fever?" If Yes, ask: "What is your temperature, how was it measured, and when did it start?"     Chills 7. CARDIAC HISTORY: "Do you have any history of heart disease?" (e.g., heart attack, congestive heart failure)      CHF 8. LUNG HISTORY: "Do you have any history of lung disease?"  (e.g., pulmonary embolus, asthma, emphysema)     COPD 9. PE RISK FACTORS: "Do you have a history of blood clots?" (or: recent major surgery, recent prolonged travel, bedridden)     No 10. OTHER SYMPTOMS: "Do you have any other symptoms?" (e.g., runny nose, wheezing, chest pain)        Runny nose 11. PREGNANCY: "Is there any chance you are pregnant?" "When was your last menstrual period?"       No 12. TRAVEL: "Have you traveled out of the country in the last month?" (e.g., travel history, exposures)       No  Protocols used: Cough - Acute Productive-A-AH

## 2021-06-20 NOTE — Progress Notes (Signed)
GYNECOLOGY VIRTUAL VISIT ENCOUNTER NOTE  Provider location: [facility] [place] [facility]   Patient location: Home  I connected with Tracey Manning on 06/20/21 at 11:15 AM EDT by MyChart Video Encounter and verified that I am speaking with the correct person using two identifiers.   I discussed the limitations, risks, security and privacy concerns of performing an evaluation and management service virtually and the availability of in person appointments. I also discussed with the patient that there may be a patient responsible charge related to this service. The patient expressed understanding and agreed to proceed.   History:  Tracey Manning is a 60 y.o. [id number] female being evaluated today for follow up for pelvic pain. She denies any abnormal vaginal discharge, bleeding, pelvic pain or other concerns.       Past Medical History:  Diagnosis Date   Anxiety    Arthritis    Asthma    Barrett's esophagus    Chest pain    Collagen vascular disease ([place])    Colon polyps    Coronary artery disease    Depression    Dyslipidemia    GERD (gastroesophageal reflux disease)    occ   Hyperlipidemia    Hypertension    Myocardial infarct ([place]) [id number]   Sciatica    Sleep apnea    no CPAP ordered but using oxygen at bedtime   Tobacco abuse    Past Surgical History:  Procedure Laterality Date   CARDIAC CATHETERIZATION  10/09/2003   [place], [place]) - LAD with 30% prox narrowing, 50% stenosis in mid-portion of PLA; RCA with 40% narrowing proximally ([name], [name])   CARDIAC CATHETERIZATION  10/10/2007   70% stenosis in first septal perforator branch of LAD, 60-70% narrowing in mid LAD, 20% narrowing in mid AV groove Cfx with 80% diffuse narrowing in small distal marginal, total occlusion of mid RCA with L to R collaterals, 90% stenosis diffusely in prox branch of RCA followed by 70% stenosis in secondary curve & 80% stenosis in small marginal  branch ([name])   CARDIAC CATHETERIZATION  05/28/2008   normal L main, RCA with 100% prox lesion w/distal filling from LAD collaterals, LAD with 20% prox tubular lesion/ 40% mid LAD lesion/previous stent patent ([name])   CARDIAC CATHETERIZATION  09/15/2009   discrete 100% osital RCA lesion, 50% prox LAD lesion, non-obstructive disease in all coronaries ([name])   [place] W/ POLYPECTOMY     CORONARY ANGIOPLASTY WITH STENT PLACEMENT  10/31/2007   PCI of distal Cfx with 2.25x17m Taxus Adam DES, 60% narrowing of mid LAD (Dr. TCorky Downs   CORONARY ANGIOPLASTY WITH STENT PLACEMENT  06/11/2011   PCI of prox-mid LAD with 3x156mDES Resolute (Dr. T.[name]  CORONARY STENT INTERVENTION N/A 06/19/2019   Procedure: CORONARY STENT INTERVENTION;  Surgeon: BeLorretta HarpMD;  [facility];  Service: Cardiovascular;  Laterality: N/A;  LAD   DILATION AND CURETTAGE, DIAGNOSTIC / THERAPEUTIC     DILITATION & CURRETTAGE/HYSTROSCOPY WITH HYDROTHERMAL ABLATION N/A 04/03/2016   Procedure: DILATATION & CURETTAGE/HYSTEROSCOPY WITH HYDROTHERMAL ABLATION;  Surgeon: ChShelly BombardMD;  [facility];  Service: Gynecology;  Laterality: N/A;   NM MYOCAR PERF WALL MOTION  08/2009   persantine myoview - normal perfusion in all regions, perfusion defect in anterior region (breast attenuation), EF 52%, low risk scan   RIGHT/LEFT  HEART CATH AND CORONARY ANGIOGRAPHY N/A 06/15/2019   Procedure: RIGHT/LEFT HEART CATH AND CORONARY ANGIOGRAPHY;  Surgeon: Belva Crome, MD;  Location: [place] [facility];  Service: Cardiovascular;  Laterality: N/A;   The following portions of the patient's history were reviewed and updated as appropriate: allergies, current medications, past family history, past medical history, past social history, past surgical history and problem list.    Review of Systems:  Pertinent items noted in HPI and remainder of  comprehensive ROS otherwise negative.  Physical Exam:   General:  Alert, oriented and cooperative. Patient appears to be in no acute distress.  Mental Status: Normal mood and affect. Normal behavior. Normal judgment and thought content.   Respiratory: Normal respiratory effort, no problems with respiration noted  Rest of physical exam deferred due to type of encounter  Labs and Imaging No results found for this or any previous visit (from the past 336 hour(s)). DG INJECT DIAG/THERA/INC NEEDLE/CATH/PLC EPI/LUMB/SAC W/IMG  Result Date: 06/13/2021 CLINICAL DATA:  Low back and bilateral lower extremity pain. Multilevel degenerative change lumbar spine. Multilevel spinal and subarticular stenosis similar to the prior CT myelogram 2019. Significant relief after the previous epidural injections performed elsewhere, without side effect or complication. EXAM: LUMBAR EPIDURAL INJECTION: DIAGNOSTIC EPIDURAL INJECTION: THERAPEUTIC EPIDURAL INJECTION: PROCEDURE: The procedure, risks, benefits, and alternatives were explained to the patient. Questions regarding the procedure were encouraged and answered. The patient understands and consents to the procedure. An interlaminar approach was performed on right at L4-5 directed towards midline. The overlying skin was cleansed and anesthetized. A 20 gauge epidural needle was advanced using loss-of-resistance technique. Injection of Isovue-M 200 shows a good epidural pattern with spread above and below the level of needle placement, primarily on the right no vascular opacification is seen. '80mg'$  of Depo-Medrol mixed with 71m lidocaine 1% were instilled. The procedure was well-tolerated, and the patient was discharged thirty minutes following the injection in good condition. FLUOROSCOPY TIME:  32 seconds; 27 uGym2 DAP COMPLICATIONS: None immediate IMPRESSION: Technically successful epidural injection on the right L4-5. Electronically Signed   By: DLucrezia EuropeM.D.   On:  06/13/2021 14:28   [place]LV[place] COMPLETE WITH TRANSVAGINAL  Result Date: 06/03/2021 CLINICAL DATA:  Pelvic pain bilaterally for 3 months, postmenopausal EXAM: TRANSABDOMINAL AND TRANSVAGINAL ULTRASOUND OF PELVIS TECHNIQUE: Both transabdominal and transvaginal ultrasound examinations of the pelvis were performed. Transabdominal technique was performed for global imaging of the pelvis including uterus, ovaries, adnexal regions, and pelvic cul-de-sac. It was necessary to proceed with endovaginal exam following the transabdominal exam to visualize the ovaries and adnexa. COMPARISON:  07/05/2019 FINDINGS: Uterus Measurements: 6.5 x 3.5 x 4.4 cm = volume: 52 mL. Anteverted. Nabothian cysts at cervix. Heterogeneous myometrium. Probable small nodular foci/intramural leiomyomata within uterus. Ill-defined 2.2 cm diameter probable fibroid at posterior upper uterus, 1.6 cm diameter fundal leiomyoma, and 1.3 cm posterior wall leiomyoma. Endometrium Thickness: 2 mm.  No endometrial fluid or focal abnormality Right ovary Not visualized, likely obscured by bowel Left ovary Measurements: 1.7 x 1.1 x 2.1 cm = volume: 2.2 mL. Small cyst LEFT ovary 1.1 cm diameter; No followup imaging recommended. Note: This recommendation does not apply to premenarchal patients or to those with increased risk (genetic, family history, elevated tumor markers or other high-risk factors) of ovarian cancer. Reference: Radiology 2019 Nov; 293(2):359-371. Other findings No free pelvic fluid.  No adnexal masses. IMPRESSION: Atrophic uterus with 3 small probable intramural uterine leiomyomata. Unremarkable endometrial complex and LEFT ovary with nonvisualization of  RIGHT ovary. No acute pelvic sonographic abnormalities. Electronically Signed   By: Lavonia Dana M.D.   On: 06/03/2021 09:38       Assessment and Plan:     1. Pelvic pain - improved - will follow clinically  2. Dysuria - resolved  3. Vaginal discharge - resolved       I discussed  the assessment and treatment plan with the patient. The patient was provided an opportunity to ask questions and all were answered. The patient agreed with the plan and demonstrated an understanding of the instructions.   The patient was advised to call back or seek an in-person evaluation/go to the ED if the symptoms worsen or if the condition fails to improve as anticipated.  I have spent a total of 10 minutes of  non-face-to-face time, excluding clinical staff time, reviewing notes and preparing to see patient, ordering tests and/or medications, and counseling the patient.    Baltazar Najjar, MD [facility] for [facility]  06/06/2021

## 2021-06-25 ENCOUNTER — Other Ambulatory Visit (HOSPITAL_COMMUNITY): Payer: Self-pay

## 2021-06-25 NOTE — Progress Notes (Signed)
Paramedicine Encounter    Patient ID: Tracey Manning, female    DOB: 10/01/1961, 60 y.o.   MRN: [id number]   Patient Care Team: Nolene Ebbs, MD as PCP - General (Internal Medicine) [name], MD as PCP - Cardiology (Cardiology) Bensimhon, Shaune Pascal, MD as PCP - Advanced Heart Failure (Cardiology) Jorge Ny, LCSW as Social Worker (Licensed Clinical Social Worker)  Patient Active Problem List   Diagnosis Date Noted   Acute on chronic combined systolic (congestive) and diastolic (congestive) heart failure ([place]) 03/10/2021   Acute respiratory failure with hypoxia ([place]) 02/23/2021   Impingement syndrome of left shoulder 03/12/2020   Chronic pain 07/18/2019   CRI (chronic renal insufficiency), stage 3 (moderate) 07/18/2019   COPD (chronic obstructive pulmonary disease) ([place]) 06/28/2019   Obesity (BMI 30-39.9) 06/28/2019   Ischemic cardiomyopathy 06/28/2019   Dyspnea on exertion [id number]   Acute systolic CHF (congestive heart failure) ([place])    AKI (acute kidney injury) ([place])    Abnormal uterine bleeding (AUB) 04/03/2016   Dysfunctional uterine bleeding 11/14/2015   Bacterial vaginosis (recurrent) 11/14/2015   Atypical chest pain 11/14/2015   Essential hypertension 04/04/2014   Dyslipidemia, goal LDL below 70 04/04/2014   CAD S/P multiple PCIs 04/04/2014    Current Outpatient Medications:    albuterol (PROVENTIL HFA) 108 (90 Base) MCG/ACT inhaler, Inhale 2 puffs by mouth every 4 hours as needed, Disp: 6.7 g, Rfl: 5   albuterol (PROVENTIL) (2.5 MG/3ML) 0.083% nebulizer solution, INHALE 1 VIAL VIA NEBULIZER EVERY 6 HOURS AS NEEDED (Patient taking differently: Take 2.5 mg by nebulization every 6 (six) hours as needed for wheezing or shortness of breath.), Disp: 90 mL, Rfl: 11   albuterol (VENTOLIN HFA) 108 (90 Base) MCG/ACT inhaler, INHALE 2 PUFFS EVERY 4 HOURS AS NEEDED (Patient taking differently: Inhale 2 puffs into the lungs every 4 (four) hours as needed for shortness of  breath or wheezing.), Disp: 8.5 g, Rfl: 5   aspirin 81 MG EC tablet, Take 1 tablet (81 mg total) by mouth daily., Disp: 90 tablet, Rfl: 3   atorvastatin (LIPITOR) 80 MG tablet, TAKE 1 TABLET BY MOUTH ONCE A DAY, Disp: 90 tablet, Rfl: 3   atorvastatin (LIPITOR) 80 MG tablet, Take 1 tablet (80 mg total) by mouth daily., Disp: 90 tablet, Rfl: 2   benzonatate (TESSALON) 100 MG capsule, Take 1 capsule (100 mg total) by mouth 2 (two) times daily., Disp: 20 capsule, Rfl: 0   bisoprolol (ZEBETA) 5 MG tablet, Take 1 tablet (5 mg total) by mouth daily., Disp: 30 tablet, Rfl: 3   cetirizine (ZYRTEC ALLERGY) 10 MG tablet, Take 1 tablet (10 mg total) by mouth daily as needed, Disp: 90 tablet, Rfl: 2   cetirizine (ZYRTEC) 10 MG tablet, TAKE 1 TABLET BY MOUTH ONCE A DAY AS NEEDED (Patient taking differently: Take 10 mg by mouth daily as needed for allergies.), Disp: 90 tablet, Rfl: 2   clopidogrel (PLAVIX) 75 MG tablet, Take 1 tablet (75 mg total) by mouth daily., Disp: 90 tablet, Rfl: 2   clopidogrel (PLAVIX) 75 MG tablet, Take 1 tablet (75 mg total) by mouth daily., Disp: 90 tablet, Rfl: 2   clotrimazole-betamethasone (LOTRISONE) cream, Apply 2 times a day as needed, Disp: 30 g, Rfl: 2   dapagliflozin propanediol (FARXIGA) 10 MG TABS tablet, TAKE 1 TABLET BY MOUTH DAILY BEFORE BREAKFAST, Disp: 90 tablet, Rfl: 3   Evolocumab 140 MG/ML SOAJ, INJECT 1 DOSE INTO THE SKIN EVERY 14 (FOURTEEN) DAYS., Disp: 2 mL, Rfl:  5   Fluticasone-Umeclidin-Vilant (TRELEGY ELLIPTA) 200-62.5-25 MCG/INH AEPB, Inhale 1 puff into the lungs daily as needed (wheezing/sob)., Disp: , Rfl:    furosemide (LASIX) 40 MG tablet, TAKE 1 TABLET BY MOUTH DAILY., Disp: 90 tablet, Rfl: 3   hydrOXYzine (ATARAX/VISTARIL) 25 MG tablet, Take 0.5-1 tablets (12.5-25 mg total) by mouth every 8 (eight) hours as needed for itching., Disp: 30 tablet, Rfl: 0   LORazepam (ATIVAN) 0.5 MG tablet, Take 1-2 tablets by mouth 30 - 60 min prior to MRI. Do not drive with  this medicine. (Patient not taking: No sig reported), Disp: 4 tablet, Rfl: 0   nitroGLYCERIN (NITROSTAT) 0.4 MG SL tablet, DISSOLVE 1 TABLET UNDER THE TONGUE AS NEEDED AS DIRECTED, Disp: 25 tablet, Rfl: 5   ofloxacin (OCUFLOX) 0.3 % ophthalmic solution, INSTILL 1 DROP 3 TIMES DAILY IN OPERATIVE EYE STARTING 2 DAYS PRIOR TO SURGERY AND AFTER SURGERY FOR 3 WEEKS., Disp: 5 mL, Rfl: 1   oxyCODONE-acetaminophen (PERCOCET) 5-325 MG tablet, Take 1 tablet by mouth every 4 (four) hours as needed for severe pain., Disp: 20 tablet, Rfl: 0   potassium chloride (KLOR-CON) 10 MEQ tablet, Take 2 tablets (20 mEq total) by mouth 2 (two) times daily., Disp: 120 tablet, Rfl: 11   prednisoLONE acetate (PRED FORTE) 1 % ophthalmic suspension, APPLY ONE DROP IN OPERATIVE EYE(S) 3 TIMES DAILY STARTING 2 DAYS BEFORE SURGERY AND AFTER SURGERY FOR 3 WEEKS, Disp: 5 mL, Rfl: 1   pregabalin (LYRICA) 75 MG capsule, Take 1 capsule (75 mg total) by mouth 2 (two) times daily as needed (nerve pain)., Disp: 60 capsule, Rfl: 3   sacubitril-valsartan (ENTRESTO) 97-103 MG, Take 1 tablet by mouth 2 (two) times daily., Disp: 180 tablet, Rfl: 3   spironolactone (ALDACTONE) 25 MG tablet, TAKE 1 TABLET BY MOUTH DAILY, Disp: 30 tablet, Rfl: 3   triamcinolone cream (KENALOG) 0.1 %, Apply 1 application topically 2 (two) times daily., Disp: 60 g, Rfl: 0   Vitamin D, Ergocalciferol, (DRISDOL) 1.25 MG (50000 UNIT) CAPS capsule, TAKE 1 CAPSULE BY MOUTH ONCE A WEEK (Patient taking differently: Take 50,000 Units by mouth once a week.), Disp: 4 capsule, Rfl: 5   zolpidem (AMBIEN) 10 MG tablet, Take 1 tablet (10 mg total) by mouth at bedtime as needed for sleep, Disp: 30 tablet, Rfl: 0   zolpidem (AMBIEN) 10 MG tablet, Take 1 tablet by mouth every night at bedtime as needed for sleep, Disp: 30 tablet, Rfl: 2 Allergies  Allergen Reactions   Coreg [Carvedilol] Other (See Comments)    Headaches   Strawberry Extract Anaphylaxis, Hives, Swelling and Other  (See Comments)    "Fresh strawberries" made the throat swell (2014)   Imdur [Isosorbide Nitrate] Other (See Comments)    Headaches    Amoxicillin Nausea And Vomiting    Did it involve swelling of the face/tongue/throat, SOB, or low BP? Yes Did it involve sudden or severe rash/hives, skin peeling, or any reaction on the inside of your mouth or nose? Yes Did you need to seek medical attention at a hospital or doctor's office? Yes When did it last happen?      within last 10 years If all above answers are "NO", may proceed with cephalosporin use.    Dilaudid [Hydromorphone Hcl] Nausea And Vomiting   Ibuprofen Other (See Comments)    Wheezing    Latex Swelling and Other (See Comments)    No reaction with Band-Aids, though   Metoprolol Other (See Comments)    Headaches and  wheezing     Social History   Socioeconomic History   Marital status: Single    Spouse name: Not on file   Number of children: 2   Years of education: 12   Highest education level: High school graduate  Occupational History   Not on file  Tobacco Use   Smoking status: Every Day    Packs/day: 0.50    Years: 34.00    Pack years: 17.00    Types: Cigarettes   Smokeless tobacco: Never  Vaping Use   Vaping Use: Never used  Substance and Sexual Activity   Alcohol use: No    Alcohol/week: 0.0 standard drinks    Comment: seldom   Drug use: Yes    Types: Marijuana    Comment: 06/19/19   Sexual activity: Yes    Partners: Male    Birth control/protection: Post-menopausal  Other Topics Concern   Not on file  Social History Narrative   Not on file   Social Determinants of Health   Financial Resource Strain: Medium Risk   Difficulty of Paying Living Expenses: Somewhat hard  Food Insecurity: Food Insecurity Present   Worried About Charity fundraiser in the Last Year: Sometimes true   Arboriculturist in the Last Year: Sometimes true  Transportation Needs: Unmet Transportation Needs   Lack of  Transportation (Medical): No   Lack of Transportation (Non-Medical): Yes  Physical Activity: Not on file  Stress: Not on file  Social Connections: Not on file  Intimate Partner Violence: Not on file    Physical Exam Vitals reviewed.  Constitutional:      Appearance: Normal appearance. She is normal weight.  HENT:     Head: Normocephalic.     Nose: Nose normal.     Mouth/Throat:     Mouth: Mucous membranes are moist.     Pharynx: Oropharynx is clear.  Eyes:     Conjunctiva/sclera: Conjunctivae normal.     Pupils: Pupils are equal, round, and reactive to light.  Cardiovascular:     Rate and Rhythm: Normal rate and regular rhythm.     Pulses: Normal pulses.     Heart sounds: Normal heart sounds.  Pulmonary:     Effort: Pulmonary effort is normal.     Breath sounds: Normal breath sounds.  Abdominal:     General: Abdomen is flat.     Palpations: Abdomen is soft.  Musculoskeletal:        General: Normal range of motion.     Cervical back: Normal range of motion.     Right lower leg: No edema.     Left lower leg: No edema.  Skin:    General: Skin is warm and dry.     Capillary Refill: Capillary refill takes less than 2 seconds.  Neurological:     General: No focal deficit present.     Mental Status: She is alert. Mental status is at baseline.  Psychiatric:        Mood and Affect: Mood normal.    Arrived for home visit for Tracey Manning who was alert and oriented reporting to be feeling fine. She states last week she had a productive cough with mucus but took OTC medications and is feeling much better. I reviewed medications and filled one pill box. Tracey Manning denied chest pain, shortness of breath, dizziness or difficulty completing daily activities. Vitals as noted in report. Tracey Manning will be going out of town in two weeks and requested two pill boxes next  week. I will bring an extra next week. Home visit complete. I will see Tracey Manning in one week.    Refills: Aspirin Lasix Pregabalin Entresto 97-103 Repatha     Future Appointments  Date Time Provider [place]  06/26/2021  1:40 PM Bensimhon, Shaune Pascal, MD [facility] None  08/06/2021 11:15 AM Hilty, Nadean Corwin, MD [facility]     ACTION: Home visit completed

## 2021-06-26 ENCOUNTER — Other Ambulatory Visit: Payer: Self-pay

## 2021-06-26 ENCOUNTER — Other Ambulatory Visit (HOSPITAL_COMMUNITY): Payer: Self-pay

## 2021-06-26 ENCOUNTER — Encounter (HOSPITAL_COMMUNITY): Payer: Self-pay | Admitting: Internal Medicine

## 2021-06-26 ENCOUNTER — Other Ambulatory Visit (HOSPITAL_COMMUNITY): Payer: Self-pay | Admitting: *Deleted

## 2021-06-26 ENCOUNTER — Ambulatory Visit (HOSPITAL_COMMUNITY)
Admission: RE | Admit: 2021-06-26 | Discharge: 2021-06-26 | Disposition: A | Discharge status: Home / Self Care | Payer: Medicaid Other | Source: Ambulatory Visit | Attending: Internal Medicine | Admitting: Internal Medicine

## 2021-06-26 VITALS — BP 112/80 | HR 52 | Wt 209.8 lb

## 2021-06-26 DIAGNOSIS — G4733 Obstructive sleep apnea (adult) (pediatric): Secondary | ICD-10-CM | POA: Insufficient documentation

## 2021-06-26 DIAGNOSIS — I255 Ischemic cardiomyopathy: Secondary | ICD-10-CM | POA: Diagnosis not present

## 2021-06-26 DIAGNOSIS — Z596 Low income: Secondary | ICD-10-CM | POA: Diagnosis not present

## 2021-06-26 DIAGNOSIS — Z885 Allergy status to narcotic agent status: Secondary | ICD-10-CM | POA: Insufficient documentation

## 2021-06-26 DIAGNOSIS — E119 Type 2 diabetes mellitus without complications: Secondary | ICD-10-CM | POA: Diagnosis not present

## 2021-06-26 DIAGNOSIS — Z886 Allergy status to analgesic agent status: Secondary | ICD-10-CM | POA: Insufficient documentation

## 2021-06-26 DIAGNOSIS — Z7984 Long term (current) use of oral hypoglycemic drugs: Secondary | ICD-10-CM | POA: Diagnosis not present

## 2021-06-26 DIAGNOSIS — Z8249 Family history of ischemic heart disease and other diseases of the circulatory system: Secondary | ICD-10-CM | POA: Diagnosis not present

## 2021-06-26 DIAGNOSIS — J449 Chronic obstructive pulmonary disease, unspecified: Secondary | ICD-10-CM | POA: Diagnosis not present

## 2021-06-26 DIAGNOSIS — Z6839 Body mass index (BMI) 39.0-39.9, adult: Secondary | ICD-10-CM | POA: Diagnosis not present

## 2021-06-26 DIAGNOSIS — I493 Ventricular premature depolarization: Secondary | ICD-10-CM | POA: Diagnosis not present

## 2021-06-26 DIAGNOSIS — Z7982 Long term (current) use of aspirin: Secondary | ICD-10-CM | POA: Diagnosis not present

## 2021-06-26 DIAGNOSIS — F17210 Nicotine dependence, cigarettes, uncomplicated: Secondary | ICD-10-CM | POA: Insufficient documentation

## 2021-06-26 DIAGNOSIS — I10 Essential (primary) hypertension: Secondary | ICD-10-CM

## 2021-06-26 DIAGNOSIS — I2510 Atherosclerotic heart disease of native coronary artery without angina pectoris: Secondary | ICD-10-CM | POA: Insufficient documentation

## 2021-06-26 DIAGNOSIS — I110 Hypertensive heart disease with heart failure: Secondary | ICD-10-CM | POA: Insufficient documentation

## 2021-06-26 DIAGNOSIS — I5022 Chronic systolic (congestive) heart failure: Secondary | ICD-10-CM | POA: Insufficient documentation

## 2021-06-26 DIAGNOSIS — Z833 Family history of diabetes mellitus: Secondary | ICD-10-CM | POA: Insufficient documentation

## 2021-06-26 DIAGNOSIS — Z720 Tobacco use: Secondary | ICD-10-CM | POA: Diagnosis not present

## 2021-06-26 DIAGNOSIS — Z880 Allergy status to penicillin: Secondary | ICD-10-CM | POA: Diagnosis not present

## 2021-06-26 DIAGNOSIS — Z79899 Other long term (current) drug therapy: Secondary | ICD-10-CM | POA: Diagnosis not present

## 2021-06-26 DIAGNOSIS — Z955 Presence of coronary angioplasty implant and graft: Secondary | ICD-10-CM | POA: Diagnosis not present

## 2021-06-26 LAB — CBC
HCT: 42.7 % (ref 36.0–46.0)
Hemoglobin: 13.7 g/dL (ref 12.0–15.0)
MCH: 27.7 pg (ref 26.0–34.0)
MCHC: 32.1 g/dL (ref 30.0–36.0)
MCV: 86.3 fL (ref 80.0–100.0)
Platelets: 255 10*3/uL (ref 150–400)
RBC: 4.95 MIL/uL (ref 3.87–5.11)
RDW: 16.8 % — ABNORMAL HIGH (ref 11.5–15.5)
WBC: 6.1 10*3/uL (ref 4.0–10.5)
nRBC: 0 % (ref 0.0–0.2)

## 2021-06-26 LAB — BASIC METABOLIC PANEL
Anion gap: 7 (ref 5–15)
BUN: 23 mg/dL — ABNORMAL HIGH (ref 6–20)
CO2: 27 mmol/L (ref 22–32)
Calcium: 9.6 mg/dL (ref 8.9–10.3)
Chloride: 104 mmol/L (ref 98–111)
Creatinine, Ser: 1.36 mg/dL — ABNORMAL HIGH (ref 0.44–1.00)
GFR, Estimated: 45 mL/min — ABNORMAL LOW (ref >60)
Glucose, Bld: 89 mg/dL (ref 70–99)
Potassium: 4.4 mmol/L (ref 3.5–5.1)
Sodium: 138 mmol/L (ref 135–145)

## 2021-06-26 LAB — BRAIN NATRIURETIC PEPTIDE: B Natriuretic Peptide: 95.2 pg/mL (ref 0.0–100.0)

## 2021-06-26 MED ORDER — BISOPROLOL FUMARATE 5 MG PO TABS: 2.5000 mg | ORAL_TABLET | Freq: Every day | ORAL | 3 refills | Status: DC

## 2021-06-26 NOTE — Progress Notes (Signed)
ADVANCED HF CLINIC PROGRESS NOTE  Primary Care: Dr. Tacy Dura  Primary Cardiologist: Dr. Gwenlyn Found HF Cardiologist: Dr. Haroldine Laws   HPI:  Tracey Manning is 60 y/o woman with morbid obesity, CAD s/p previous PCI, COPD with ongoing tobacco use, OSA (intolerant of CPAP), borderline DM2, HTN, HL and systolic HF due to iCM.   Had remote PCI in 2004 in [place].  In 2008 she had a circumflex PCI by Dr. Claiborne Billings.  She had known occluded RCA with left-to-right collaterals.  In 2012 she had an LAD stent placedby Dr. Claiborne Billings.   She was admitted 7/20 with respiratory failure and unstable angina.  She had new LV dysfunction with an EF of 25%.  She had acute kidney injury as well with a creatinine of 1.8.  the patient was admitted, diuresed, and underwent diagnostic catheterization 06/15/2019.  This revealed a 60 to 70% mid circumflex stenosis, known occluded RCA with left-to-right collaterals, and a 95% proximal LAD before the previously placed stent which was 50 to 60% narrowed.  After review the patient was turned down for CABG and she underwent intervention to her LAD on 06/19/2019 by Dr. Gwenlyn Found.    She had a f/u echo 10/20 EF 25-30%.  Admitted 02/23/21 with increased shortness of breath. Diuresed with IV lasix and discharged the next day. -Echo 02/2021 EF 30-35%   Today she returns for HF follow up. Says she is feeling pretty good. Had URI last week but now better. Breathing ok. No edema, orthopnea or PND. Still smoking 6-7 cigs/day. Compliant with meds. Followed by Nira Conn in [place].   Zio 12/21:  1. Sinus rhythm - avg HR of 90 bpm. 2. One 6-beat run NSVT 3. Five runs of SVT - the longest lasting 24.6 secs with an avg rate of 119 bpm. Isolated SVEs were rare (<1.0%) 4. Occasional PVCs (4.0%, [id number]) - however on one day there was 10.5% burden and another ay 8.7% burden.    Echo 06/19/20: EF 40-45% RV ok   Past Medical History:  Diagnosis Date   Anxiety    Arthritis    Asthma    Barrett's  esophagus    Chest pain    Collagen vascular disease ([place])    Colon polyps    Coronary artery disease    Depression    Dyslipidemia    GERD (gastroesophageal reflux disease)    occ   Hyperlipidemia    Hypertension    Myocardial infarct [facility]) [id number]   Sciatica    Sleep apnea    no CPAP ordered but using oxygen at bedtime   Tobacco abuse     Current Outpatient Medications  Medication Sig Dispense Refill   albuterol (PROVENTIL HFA) 108 (90 Base) MCG/ACT inhaler Inhale 2 puffs by mouth every 4 hours as needed 6.7 g 5   albuterol (PROVENTIL) (2.5 MG/3ML) 0.083% nebulizer solution INHALE 1 VIAL VIA NEBULIZER EVERY 6 HOURS AS NEEDED 90 mL 11   aspirin 81 MG EC tablet Take 1 tablet (81 mg total) by mouth daily. 90 tablet 3   atorvastatin (LIPITOR) 80 MG tablet Take 1 tablet (80 mg total) by mouth daily. 90 tablet 2   benzonatate (TESSALON) 100 MG capsule Take 1 capsule (100 mg total) by mouth 2 (two) times daily. 20 capsule 0   bisoprolol (ZEBETA) 5 MG tablet Take 1 tablet (5 mg total) by mouth daily. 30 tablet 3   cetirizine (ZYRTEC ALLERGY) 10 MG tablet Take 1 tablet (10 mg total) by mouth daily as needed  90 tablet 2   cetirizine (ZYRTEC) 10 MG tablet TAKE 1 TABLET BY MOUTH ONCE A DAY AS NEEDED 90 tablet 2   clopidogrel (PLAVIX) 75 MG tablet Take 1 tablet (75 mg total) by mouth daily. 90 tablet 2   clotrimazole-betamethasone (LOTRISONE) cream Apply 2 times a day as needed 30 g 2   dapagliflozin propanediol (FARXIGA) 10 MG TABS tablet TAKE 1 TABLET BY MOUTH DAILY BEFORE BREAKFAST 90 tablet 3   Evolocumab 140 MG/ML SOAJ INJECT 1 DOSE INTO THE SKIN EVERY 14 (FOURTEEN) DAYS. 2 mL 5   Fluticasone-Umeclidin-Vilant (TRELEGY ELLIPTA) 200-62.5-25 MCG/INH AEPB Inhale 1 puff into the lungs daily as needed (wheezing/sob).     furosemide (LASIX) 40 MG tablet TAKE 1 TABLET BY MOUTH DAILY. 90 tablet 3   hydrOXYzine (ATARAX/VISTARIL) 25 MG tablet Take 0.5-1 tablets (12.5-25 mg total) by mouth every 8  (eight) hours as needed for itching. 30 tablet 0   LORazepam (ATIVAN) 0.5 MG tablet Take 1-2 tablets by mouth 30 - 60 min prior to MRI. Do not drive with this medicine. 4 tablet 0   nitroGLYCERIN (NITROSTAT) 0.4 MG SL tablet DISSOLVE 1 TABLET UNDER THE TONGUE AS NEEDED AS DIRECTED 25 tablet 5   oxyCODONE-acetaminophen (PERCOCET) 5-325 MG tablet Take 1 tablet by mouth every 4 (four) hours as needed for severe pain. 20 tablet 0   potassium chloride (KLOR-CON) 10 MEQ tablet Take 2 tablets (20 mEq total) by mouth 2 (two) times daily. 120 tablet 11   pregabalin (LYRICA) 75 MG capsule Take 1 capsule (75 mg total) by mouth 2 (two) times daily as needed (nerve pain). 60 capsule 3   sacubitril-valsartan (ENTRESTO) 97-103 MG Take 1 tablet by mouth 2 (two) times daily. 180 tablet 3   spironolactone (ALDACTONE) 25 MG tablet TAKE 1 TABLET BY MOUTH DAILY 30 tablet 3   triamcinolone cream (KENALOG) 0.1 % Apply 1 application topically 2 (two) times daily. 60 g 0   Vitamin D, Ergocalciferol, (DRISDOL) 1.25 MG (50000 UNIT) CAPS capsule TAKE 1 CAPSULE BY MOUTH ONCE A WEEK 4 capsule 5   zolpidem (AMBIEN) 10 MG tablet Take 1 tablet (10 mg total) by mouth at bedtime as needed for sleep 30 tablet 0   No current facility-administered medications for this encounter.    Allergies  Allergen Reactions   Coreg [Carvedilol] Other (See Comments)    Headaches   Strawberry Extract Anaphylaxis, Hives, Swelling and Other (See Comments)    "Fresh strawberries" made the throat swell (2014)   Imdur [Isosorbide Nitrate] Other (See Comments)    Headaches    Amoxicillin Nausea And Vomiting    Did it involve swelling of the face/tongue/throat, SOB, or low BP? Yes Did it involve sudden or severe rash/hives, skin peeling, or any reaction on the inside of your mouth or nose? Yes Did you need to seek medical attention at a hospital or doctor's office? Yes When did it last happen?      within last 10 years If all above answers are  "NO", may proceed with cephalosporin use.    Dilaudid [Hydromorphone Hcl] Nausea And Vomiting   Ibuprofen Other (See Comments)    Wheezing    Latex Swelling and Other (See Comments)    No reaction with Band-Aids, though   Metoprolol Other (See Comments)    Headaches and wheezing     Social History   Socioeconomic History   Marital status: Single    Spouse name: Not on file   Number of children: 2  Years of education: 60   Highest education level: High school graduate  Occupational History   Not on file  Tobacco Use   Smoking status: Every Day    Packs/day: 0.50    Years: 34.00    Pack years: 17.00    Types: Cigarettes   Smokeless tobacco: Never  Vaping Use   Vaping Use: Never used  Substance and Sexual Activity   Alcohol use: No    Alcohol/week: 0.0 standard drinks    Comment: seldom   Drug use: Yes    Types: Marijuana    Comment: 06/19/19   Sexual activity: Yes    Partners: Male    Birth control/protection: Post-menopausal  Other Topics Concern   Not on file  Social History Narrative   Not on file   Social Determinants of Health   Financial Resource Strain: Medium Risk   Difficulty of Paying Living Expenses: Somewhat hard  Food Insecurity: Landscape architect Present   Worried About Charity fundraiser in the Last Year: Sometimes true   Arboriculturist in the Last Year: Sometimes true  Transportation Needs: Unmet Transportation Needs   Lack of Transportation (Medical): No   Lack of Transportation (Non-Medical): Yes  Physical Activity: Not on file  Stress: Not on file  Social Connections: Not on file  Intimate Partner Violence: Not on file     Family History  Problem Relation Age of Onset   Leukemia Mother    Clotting disorder Father        blood clot   Hypertension Sister    Diabetes Sister    Stroke Sister 40   Bleeding Disorder Son        ITP "free bleeding disorder"   Colon cancer Paternal Grandmother    Diabetes Paternal Grandmother     Stomach cancer Neg Hx    Pancreatic cancer Neg Hx     Vitals:   06/26/21 1344  BP: 112/80  Pulse: (!) 52  SpO2: 98%  Weight: 95.2 kg (209 lb 12.8 oz)   Wt Readings from Last 3 Encounters:  06/26/21 95.2 kg (209 lb 12.8 oz)  06/25/21 94.5 kg (208 lb 6.4 oz)  06/11/21 95.6 kg (210 lb 12.8 oz)    PHYSICAL EXAM: General:  Well appearing. No resp difficulty HEENT: normal Neck: supple. no JVD. Carotids 2+ bilat; no bruits. No lymphadenopathy or thryomegaly appreciated. Cor: PMI nondisplaced. Regular brady No rubs, gallops or murmurs. Lungs: coarse mild EE wheeze Abdomen: obese soft, nontender, nondistended. No hepatosplenomegaly. No bruits or masses. Good bowel sounds. Extremities: no cyanosis, clubbing, rash, edema Neuro: alert & orientedx3, cranial nerves grossly intact. moves all 4 extremities w/o difficulty. Affect pleasant  ECG: Sinus brady 47 Personally reviewed  ASSESSMENT & PLAN:  1. Chronic systolic CHF due to iCM - Echo 10/20 EF 25-30% - Echo 05/2020 EF 40-45%  - Echo 02/2021 EF 30-35%  - Stable NYHA II. Volume status ok. On lasix 40 day.  - With bradycardia cut bisoprolol '5mg'$  -> 2.'5mg'$  dailyIntolerant metoprolol & carvedilol due to headache.   - Continue  entresto 97-103 once she receives in the mail.  - Continue spironolactone 25 mg daily. - Continue hydralazine. Intolerant imdur due to migraine.  - Continue Farxiga 10 mg daily.  - Check labs    2. CAD S/P multiple PCIs - s/p PCI in [place] - s/p OM PCI '08-Dr Claiborne Billings - s/p LAD PCI/DES 2012 - s/p pLAD PCI with DES 06/19/2019- Dr Gwenlyn Found after patient turned down  for CABG. Known occluded RCA with L-R collaterals, 65% mCFX-EF 25% - No s/s angina - Continue ASA/statin/repatha - Managed by Dr. Gwenlyn Found.    3. HTN - BP stable   4. COPD with ongoing tobacco use - Discussed need for complete cessation   5. OSA: - Intolerant CPAP.   6. PVCs - See Zio report. -  Not ideal amio candidate with lung  disease.  7. Obesity Body mass index is 39.64 kg/m.   Continue HF Paramedicine for medication management.    Glori Bickers MD 06/26/21 2:26 PM

## 2021-06-26 NOTE — Addendum Note (Signed)
Encounter addended by: Stanford Scotland, RN on: 06/26/2021 2:56 PM  Actions taken: Clinical Note Signed

## 2021-06-26 NOTE — Progress Notes (Signed)
Medication Samples have been provided to the patient.  Drug name: [name]       Strength: 49/51 mg        Qty: 2  LOT: [id number]  Exp.Date: 05/2023  Dosing instructions: Take 2 tablets.Twice daily   The patient has been instructed regarding the correct time, dose, and frequency of taking this medication, including desired effects and most common side effects.   Juanita Laster Dewitt Judice 2:55 PM 06/26/2021

## 2021-06-26 NOTE — Addendum Note (Signed)
Encounter addended by: Shonna Chock, CMA on: [id number] 2:35 PM  Actions taken: Order list changed, Diagnosis association updated, Pharmacy for encounter modified, Charge Capture section accepted, Clinical Note Signed

## 2021-06-26 NOTE — Patient Instructions (Addendum)
EKG done today.  Labs done today. We will contact you only if your labs are abnormal.  DECREASE Bisoprolol to 2.'5mg'$  (1/2 tablet) by mouth daily.   No other medication changes were made. Please continue all current medications as prescribed.  Your physician recommends that you schedule a follow-up appointment in: 6 months. Please contact our office in January to schedule a February appointment.   If you have any questions or concerns before your next appointment please send [place] a message through [place] or call our office at [phone].    TO LEAVE A MESSAGE FOR THE NURSE SELECT OPTION 2, PLEASE LEAVE A MESSAGE INCLUDING: YOUR NAME DATE OF BIRTH CALL BACK NUMBER REASON FOR CALL**this is important as we prioritize the call backs  YOU WILL RECEIVE A CALL BACK THE SAME DAY AS LONG AS YOU CALL BEFORE 4:00 PM   Do the following things EVERYDAY: Weigh yourself in the morning before breakfast. Write it down and keep it in a log. Take your medicines as prescribed Eat low salt foods--Limit salt (sodium) to 2000 mg per day.  Stay as active as you can everyday Limit all fluids for the day to less than 2 liters   At the [facility], you and your health needs are our priority. As part of our continuing mission to provide you with exceptional heart care, we have created designated Provider Care Teams. These Care Teams include your primary Cardiologist (physician) and Advanced Practice Providers (APPs- Physician Assistants and Nurse Practitioners) who all work together to provide you with the care you need, when you need it.   You may see any of the following providers on your designated Care Team at your next follow up: Dr Glori Bickers Dr Haynes Kerns, NP Lyda Jester, [place] Audry Riles, PharmD   Please be sure to bring in all your medications bottles to every appointment.

## 2021-07-02 ENCOUNTER — Other Ambulatory Visit (HOSPITAL_COMMUNITY): Payer: Self-pay

## 2021-07-02 NOTE — Progress Notes (Signed)
Paramedicine Encounter    Patient ID: Tracey Manning, female    DOB: 13-Jul-1961, 60 y.o.   MRN: [id number]   Patient Care Team: Nolene Ebbs, MD as PCP - General (Internal Medicine) [name], MD as PCP - Cardiology (Cardiology) Bensimhon, Shaune Pascal, MD as PCP - Advanced Heart Failure (Cardiology) Jorge Ny, LCSW as Social Worker (Licensed Clinical Social Worker)  Patient Active Problem List   Diagnosis Date Noted   Acute on chronic combined systolic (congestive) and diastolic (congestive) heart failure ([place]) 03/10/2021   Acute respiratory failure with hypoxia ([place]) 02/23/2021   Impingement syndrome of left shoulder 03/12/2020   Chronic pain 07/18/2019   CRI (chronic renal insufficiency), stage 3 (moderate) 07/18/2019   COPD (chronic obstructive pulmonary disease) ([place]) 06/28/2019   Obesity (BMI 30-39.9) 06/28/2019   Ischemic cardiomyopathy 06/28/2019   Dyspnea on exertion 25/42/7062   Acute systolic CHF (congestive heart failure) ([place])    AKI (acute kidney injury) ([place])    Abnormal uterine bleeding (AUB) 04/03/2016   Dysfunctional uterine bleeding 11/14/2015   Bacterial vaginosis (recurrent) 11/14/2015   Atypical chest pain 11/14/2015   Essential hypertension 04/04/2014   Dyslipidemia, goal LDL below 70 04/04/2014   CAD S/P multiple PCIs 04/04/2014    Current Outpatient Medications:    albuterol (PROVENTIL HFA) 108 (90 Base) MCG/ACT inhaler, Inhale 2 puffs by mouth every 4 hours as needed, Disp: 6.7 g, Rfl: 5   albuterol (PROVENTIL) (2.5 MG/3ML) 0.083% nebulizer solution, INHALE 1 VIAL VIA NEBULIZER EVERY 6 HOURS AS NEEDED, Disp: 90 mL, Rfl: 11   aspirin 81 MG EC tablet, Take 1 tablet (81 mg total) by mouth daily., Disp: 90 tablet, Rfl: 3   atorvastatin (LIPITOR) 80 MG tablet, Take 1 tablet (80 mg total) by mouth daily., Disp: 90 tablet, Rfl: 2   benzonatate (TESSALON) 100 MG capsule, Take 1 capsule (100 mg total) by mouth 2 (two) times daily., Disp: 20 capsule,  Rfl: 0   bisoprolol (ZEBETA) 5 MG tablet, Take 0.5 tablets (2.5 mg total) by mouth daily., Disp: 45 tablet, Rfl: 3   cetirizine (ZYRTEC ALLERGY) 10 MG tablet, Take 1 tablet (10 mg total) by mouth daily as needed, Disp: 90 tablet, Rfl: 2   cetirizine (ZYRTEC) 10 MG tablet, TAKE 1 TABLET BY MOUTH ONCE A DAY AS NEEDED, Disp: 90 tablet, Rfl: 2   clopidogrel (PLAVIX) 75 MG tablet, Take 1 tablet (75 mg total) by mouth daily., Disp: 90 tablet, Rfl: 2   clotrimazole-betamethasone (LOTRISONE) cream, Apply 2 times a day as needed, Disp: 30 g, Rfl: 2   dapagliflozin propanediol (FARXIGA) 10 MG TABS tablet, TAKE 1 TABLET BY MOUTH DAILY BEFORE BREAKFAST, Disp: 90 tablet, Rfl: 3   Evolocumab 140 MG/ML SOAJ, INJECT 1 DOSE INTO THE SKIN EVERY 14 (FOURTEEN) DAYS., Disp: 2 mL, Rfl: 5   Fluticasone-Umeclidin-Vilant (TRELEGY ELLIPTA) 200-62.5-25 MCG/INH AEPB, Inhale 1 puff into the lungs daily as needed (wheezing/sob)., Disp: , Rfl:    furosemide (LASIX) 40 MG tablet, TAKE 1 TABLET BY MOUTH DAILY., Disp: 90 tablet, Rfl: 3   hydrOXYzine (ATARAX/VISTARIL) 25 MG tablet, Take 0.5-1 tablets (12.5-25 mg total) by mouth every 8 (eight) hours as needed for itching., Disp: 30 tablet, Rfl: 0   LORazepam (ATIVAN) 0.5 MG tablet, Take 1-2 tablets by mouth 30 - 60 min prior to MRI. Do not drive with this medicine., Disp: 4 tablet, Rfl: 0   nitroGLYCERIN (NITROSTAT) 0.4 MG SL tablet, DISSOLVE 1 TABLET UNDER THE TONGUE AS NEEDED AS DIRECTED, Disp:  25 tablet, Rfl: 5   oxyCODONE-acetaminophen (PERCOCET) 5-325 MG tablet, Take 1 tablet by mouth every 4 (four) hours as needed for severe pain., Disp: 20 tablet, Rfl: 0   potassium chloride (KLOR-CON) 10 MEQ tablet, Take 2 tablets (20 mEq total) by mouth 2 (two) times daily., Disp: 120 tablet, Rfl: 11   pregabalin (LYRICA) 75 MG capsule, Take 1 capsule (75 mg total) by mouth 2 (two) times daily as needed (nerve pain)., Disp: 60 capsule, Rfl: 3   sacubitril-valsartan (ENTRESTO) 97-103 MG, Take  1 tablet by mouth 2 (two) times daily., Disp: 180 tablet, Rfl: 3   spironolactone (ALDACTONE) 25 MG tablet, TAKE 1 TABLET BY MOUTH DAILY, Disp: 30 tablet, Rfl: 3   triamcinolone cream (KENALOG) 0.1 %, Apply 1 application topically 2 (two) times daily., Disp: 60 g, Rfl: 0   Vitamin D, Ergocalciferol, (DRISDOL) 1.25 MG (50000 UNIT) CAPS capsule, TAKE 1 CAPSULE BY MOUTH ONCE A WEEK, Disp: 4 capsule, Rfl: 5   zolpidem (AMBIEN) 10 MG tablet, Take 1 tablet (10 mg total) by mouth at bedtime as needed for sleep, Disp: 30 tablet, Rfl: 0 Allergies  Allergen Reactions   Coreg [Carvedilol] Other (See Comments)    Headaches   Strawberry Extract Anaphylaxis, Hives, Swelling and Other (See Comments)    "Fresh strawberries" made the throat swell (2014)   Imdur [Isosorbide Nitrate] Other (See Comments)    Headaches    Amoxicillin Nausea And Vomiting    Did it involve swelling of the face/tongue/throat, SOB, or low BP? Yes Did it involve sudden or severe rash/hives, skin peeling, or any reaction on the inside of your mouth or nose? Yes Did you need to seek medical attention at a hospital or doctor's office? Yes When did it last happen?      within last 10 years If all above answers are "NO", may proceed with cephalosporin use.    Dilaudid [Hydromorphone Hcl] Nausea And Vomiting   Ibuprofen Other (See Comments)    Wheezing    Latex Swelling and Other (See Comments)    No reaction with Band-Aids, though   Metoprolol Other (See Comments)    Headaches and wheezing     Social History   Socioeconomic History   Marital status: Single    Spouse name: Not on file   Number of children: 2   Years of education: 12   Highest education level: High school graduate  Occupational History   Not on file  Tobacco Use   Smoking status: Every Day    Packs/day: 0.50    Years: 34.00    Pack years: 17.00    Types: Cigarettes   Smokeless tobacco: Never  Vaping Use   Vaping Use: Never used  Substance and  Sexual Activity   Alcohol use: No    Alcohol/week: 0.0 standard drinks    Comment: seldom   Drug use: Yes    Types: Marijuana    Comment: 06/19/19   Sexual activity: Yes    Partners: Male    Birth control/protection: Post-menopausal  Other Topics Concern   Not on file  Social History Narrative   Not on file   Social Determinants of Health   Financial Resource Strain: Medium Risk   Difficulty of Paying Living Expenses: Somewhat hard  Food Insecurity: Food Insecurity Present   Worried About Running Out of Food in the Last Year: Sometimes true   Ran Out of Food in the Last Year: Sometimes true  Transportation Needs: Unmet Transportation Needs  Lack of Transportation (Medical): No   Lack of Transportation (Non-Medical): Yes  Physical Activity: Not on file  Stress: Not on file  Social Connections: Not on file  Intimate Partner Violence: Not on file    Physical Exam Vitals reviewed.  Constitutional:      Appearance: Normal appearance. She is normal weight.  HENT:     Head: Normocephalic.     Nose: Nose normal.     Mouth/Throat:     Mouth: Mucous membranes are moist.     Pharynx: Oropharynx is clear.  Eyes:     Conjunctiva/sclera: Conjunctivae normal.     Pupils: Pupils are equal, round, and reactive to light.  Cardiovascular:     Rate and Rhythm: Normal rate and regular rhythm.     Pulses: Normal pulses.     Heart sounds: Normal heart sounds.  Pulmonary:     Effort: Pulmonary effort is normal.     Breath sounds: Wheezing present.  Abdominal:     General: Abdomen is flat.     Palpations: Abdomen is soft.  Musculoskeletal:        General: No swelling. Normal range of motion.     Cervical back: Normal range of motion.     Right lower leg: No edema.     Left lower leg: No edema.  Skin:    General: Skin is warm and dry.     Capillary Refill: Capillary refill takes less than 2 seconds.  Neurological:     General: No focal deficit present.     Mental Status: She is  alert. Mental status is at baseline.  Psychiatric:        Mood and Affect: Mood normal.   Arrived for home visit for Tracey Manning who was alert and oriented seated on her couch reporting to be feeling okay just continuing to have a persistent productive cough. Floss denied fever, chills, body aches, chest pain or increased shortness of breath. Vitals were obtained. Lung sounds revealed upper lobe wheezing. Patient has been compliant with inhalers and medications. Medications were reviewed and 2 pill boxed filled accordingly. Tracey Manning reports she will be going out of town in the coming weeks but agreed to phone check in next week. I advised her if the cough doesn't go away and her breathing worsens to seek medical care. She agreed with plan.   Repatha injection given at 0951.  Home visit complete.   Refills: Entresto Ambien (will need next week at bedtime)     Future Appointments  Date Time Provider [name]  08/06/2021 11:15 AM Hilty, Nadean Corwin, MD [facility]     ACTION: Home visit completed

## 2021-07-09 ENCOUNTER — Telehealth (HOSPITAL_COMMUNITY): Payer: Self-pay

## 2021-07-09 NOTE — Telephone Encounter (Signed)
Spoke to Tracey Manning to check in on the status of her going out of town and she stated she was still unsure. She will call and advise as soon as she knows. I plan to see her on the 24th for a home visit pending her departure. Tracey Manning reports she is doing well and feeling better from last week. Call complete.

## 2021-07-23 ENCOUNTER — Other Ambulatory Visit (HOSPITAL_COMMUNITY): Payer: Self-pay

## 2021-07-23 NOTE — Progress Notes (Signed)
Paramedicine Encounter    Patient ID: Tracey Manning, female    DOB: [birth year], 60 y.o.   MRN: [id number]   Patient Care Team: Nolene Ebbs, MD as PCP - General (Internal Medicine) [name], MD as PCP - Cardiology (Cardiology) Bensimhon, Shaune Pascal, MD as PCP - Advanced Heart Failure (Cardiology) Jorge Ny, LCSW as Social Worker (Licensed Clinical Social Worker)  Patient Active Problem List   Diagnosis Date Noted   Acute on chronic combined systolic (congestive) and diastolic (congestive) heart failure ([place]) 03/10/2021   Acute respiratory failure with hypoxia ([place]) 02/23/2021   Impingement syndrome of left shoulder 03/12/2020   Chronic pain 07/18/2019   CRI (chronic renal insufficiency), stage 3 (moderate) 07/18/2019   COPD (chronic obstructive pulmonary disease) ([place]) 06/28/2019   Obesity (BMI 30-39.9) 06/28/2019   Ischemic cardiomyopathy 06/28/2019   Dyspnea on exertion 99991111   Acute systolic CHF (congestive heart failure) ([place])    AKI (acute kidney injury) ([place])    Abnormal uterine bleeding (AUB) 04/03/2016   Dysfunctional uterine bleeding 11/14/2015   Bacterial vaginosis (recurrent) 11/14/2015   Atypical chest pain 11/14/2015   Essential hypertension 04/04/2014   Dyslipidemia, goal LDL below 70 04/04/2014   CAD S/P multiple PCIs 04/04/2014    Current Outpatient Medications:    albuterol (PROVENTIL HFA) 108 (90 Base) MCG/ACT inhaler, Inhale 2 puffs by mouth every 4 hours as needed, Disp: 6.7 g, Rfl: 5   albuterol (PROVENTIL) (2.5 MG/3ML) 0.083% nebulizer solution, INHALE 1 VIAL VIA NEBULIZER EVERY 6 HOURS AS NEEDED, Disp: 90 mL, Rfl: 11   aspirin 81 MG EC tablet, Take 1 tablet (81 mg total) by mouth daily., Disp: 90 tablet, Rfl: 3   atorvastatin (LIPITOR) 80 MG tablet, Take 1 tablet (80 mg total) by mouth daily., Disp: 90 tablet, Rfl: 2   benzonatate (TESSALON) 100 MG capsule, Take 1 capsule (100 mg total) by mouth 2 (two) times daily., Disp: 20 capsule,  Rfl: 0   bisoprolol (ZEBETA) 5 MG tablet, Take 0.5 tablets (2.5 mg total) by mouth daily., Disp: 45 tablet, Rfl: 3   cetirizine (ZYRTEC ALLERGY) 10 MG tablet, Take 1 tablet (10 mg total) by mouth daily as needed, Disp: 90 tablet, Rfl: 2   cetirizine (ZYRTEC) 10 MG tablet, TAKE 1 TABLET BY MOUTH ONCE A DAY AS NEEDED, Disp: 90 tablet, Rfl: 2   clopidogrel (PLAVIX) 75 MG tablet, Take 1 tablet (75 mg total) by mouth daily., Disp: 90 tablet, Rfl: 2   clotrimazole-betamethasone (LOTRISONE) cream, Apply 2 times a day as needed, Disp: 30 g, Rfl: 2   dapagliflozin propanediol (FARXIGA) 10 MG TABS tablet, TAKE 1 TABLET BY MOUTH DAILY BEFORE BREAKFAST, Disp: 90 tablet, Rfl: 3   Evolocumab 140 MG/ML SOAJ, INJECT 1 DOSE INTO THE SKIN EVERY 14 (FOURTEEN) DAYS., Disp: 2 mL, Rfl: 5   Fluticasone-Umeclidin-Vilant (TRELEGY ELLIPTA) 200-62.5-25 MCG/INH AEPB, Inhale 1 puff into the lungs daily as needed (wheezing/sob)., Disp: , Rfl:    furosemide (LASIX) 40 MG tablet, TAKE 1 TABLET BY MOUTH DAILY., Disp: 90 tablet, Rfl: 3   hydrOXYzine (ATARAX/VISTARIL) 25 MG tablet, Take 0.5-1 tablets (12.5-25 mg total) by mouth every 8 (eight) hours as needed for itching., Disp: 30 tablet, Rfl: 0   LORazepam (ATIVAN) 0.5 MG tablet, Take 1-2 tablets by mouth 30 - 60 min prior to MRI. Do not drive with this medicine. (Patient not taking: Reported on 07/02/2021), Disp: 4 tablet, Rfl: 0   nitroGLYCERIN (NITROSTAT) 0.4 MG SL tablet, DISSOLVE 1 TABLET UNDER THE  TONGUE AS NEEDED AS DIRECTED, Disp: 25 tablet, Rfl: 5   oxyCODONE-acetaminophen (PERCOCET) 5-325 MG tablet, Take 1 tablet by mouth every 4 (four) hours as needed for severe pain., Disp: 20 tablet, Rfl: 0   potassium chloride (KLOR-CON) 10 MEQ tablet, Take 2 tablets (20 mEq total) by mouth 2 (two) times daily., Disp: 120 tablet, Rfl: 11   pregabalin (LYRICA) 75 MG capsule, Take 1 capsule (75 mg total) by mouth 2 (two) times daily as needed (nerve pain)., Disp: 60 capsule, Rfl: 3    sacubitril-valsartan (ENTRESTO) 97-103 MG, Take 1 tablet by mouth 2 (two) times daily., Disp: 180 tablet, Rfl: 3   spironolactone (ALDACTONE) 25 MG tablet, TAKE 1 TABLET BY MOUTH DAILY, Disp: 30 tablet, Rfl: 3   triamcinolone cream (KENALOG) 0.1 %, Apply 1 application topically 2 (two) times daily., Disp: 60 g, Rfl: 0   Vitamin D, Ergocalciferol, (DRISDOL) 1.25 MG (50000 UNIT) CAPS capsule, TAKE 1 CAPSULE BY MOUTH ONCE A WEEK, Disp: 4 capsule, Rfl: 5   zolpidem (AMBIEN) 10 MG tablet, Take 1 tablet (10 mg total) by mouth at bedtime as needed for sleep, Disp: 30 tablet, Rfl: 0 Allergies  Allergen Reactions   Coreg [Carvedilol] Other (See Comments)    Headaches   Strawberry Extract Anaphylaxis, Hives, Swelling and Other (See Comments)    "Fresh strawberries" made the throat swell (2014)   Imdur [Isosorbide Nitrate] Other (See Comments)    Headaches    Amoxicillin Nausea And Vomiting    Did it involve swelling of the face/tongue/throat, SOB, or low BP? Yes Did it involve sudden or severe rash/hives, skin peeling, or any reaction on the inside of your mouth or nose? Yes Did you need to seek medical attention at a hospital or doctor's office? Yes When did it last happen?      within last 10 years If all above answers are "NO", may proceed with cephalosporin use.    Dilaudid [Hydromorphone Hcl] Nausea And Vomiting   Ibuprofen Other (See Comments)    Wheezing    Latex Swelling and Other (See Comments)    No reaction with Band-Aids, though   Metoprolol Other (See Comments)    Headaches and wheezing     Social History   Socioeconomic History   Marital status: Single    Spouse name: Not on file   Number of children: 2   Years of education: 12   Highest education level: High school graduate  Occupational History   Not on file  Tobacco Use   Smoking status: Every Day    Packs/day: 0.50    Years: 34.00    Pack years: 17.00    Types: Cigarettes   Smokeless tobacco: Never  Vaping Use    Vaping Use: Never used  Substance and Sexual Activity   Alcohol use: No    Alcohol/week: 0.0 standard drinks    Comment: seldom   Drug use: Yes    Types: Marijuana    Comment: 06/19/19   Sexual activity: Yes    Partners: Male    Birth control/protection: Post-menopausal  Other Topics Concern   Not on file  Social History Narrative   Not on file   Social Determinants of Health   Financial Resource Strain: Medium Risk   Difficulty of Paying Living Expenses: Somewhat hard  Food Insecurity: Food Insecurity Present   Worried About Running Out of Food in the Last Year: Sometimes true   Ran Out of Food in the Last Year: Sometimes true  Transportation  Needs: Unmet Transportation Needs   Lack of Transportation (Medical): No   Lack of Transportation (Non-Medical): Yes  Physical Activity: Not on file  Stress: Not on file  Social Connections: Not on file  Intimate Partner Violence: Not on file    Physical Exam Vitals reviewed.  Constitutional:      Appearance: Normal appearance. She is normal weight.  HENT:     Head: Normocephalic.     Nose: Nose normal.     Mouth/Throat:     Mouth: Mucous membranes are moist.     Pharynx: Oropharynx is clear.  Eyes:     Conjunctiva/sclera: Conjunctivae normal.     Pupils: Pupils are equal, round, and reactive to light.  Cardiovascular:     Rate and Rhythm: Normal rate and regular rhythm.     Pulses: Normal pulses.     Heart sounds: Normal heart sounds.  Pulmonary:     Effort: Pulmonary effort is normal.     Breath sounds: Normal breath sounds.  Abdominal:     General: Abdomen is flat.     Palpations: Abdomen is soft.  Musculoskeletal:        General: No swelling. Normal range of motion.     Cervical back: Normal range of motion.     Right lower leg: No edema.     Left lower leg: No edema.  Skin:    General: Skin is warm.  Neurological:     General: No focal deficit present.     Mental Status: She is alert. Mental status is at  baseline.   Arrived for home visit for Tracey Manning who was alert and oriented reporting to be feeling good today. Tracey Manning denied shortness of breath, dizziness or chest pain. She did report some increased anxiety as she has been without her Hydroxyzine for a few days. She will be picking up same tomorrow at [facility].   Tracey Manning has been compliant with her medications over the last two weeks. I reviewed same and she has enough medications at home to last a week, but will be picking up bubble packs from [facility] tomorrow. (I went by [place] and verified same)  Repatha was given at 1037   Vitals and assessment as noted. Lungs clear, no edema or swelling noted.   We reviewed upcoming appointments and home visit completed. I will see her in one to two weeks.        Future Appointments  Date Time Provider [place]  08/06/2021 11:15 AM Hilty, Nadean Corwin, MD [facility]     ACTION: Home visit completed

## 2021-08-06 ENCOUNTER — Ambulatory Visit (INDEPENDENT_AMBULATORY_CARE_PROVIDER_SITE_OTHER): Payer: Medicaid Other | Admitting: Internal Medicine

## 2021-08-06 ENCOUNTER — Ambulatory Visit (HOSPITAL_BASED_OUTPATIENT_CLINIC_OR_DEPARTMENT_OTHER): Payer: Medicaid Other | Admitting: Internal Medicine

## 2021-08-06 ENCOUNTER — Other Ambulatory Visit: Payer: Self-pay

## 2021-08-06 ENCOUNTER — Encounter (HOSPITAL_BASED_OUTPATIENT_CLINIC_OR_DEPARTMENT_OTHER): Payer: Self-pay | Admitting: Internal Medicine

## 2021-08-06 ENCOUNTER — Other Ambulatory Visit (HOSPITAL_COMMUNITY): Payer: Self-pay

## 2021-08-06 VITALS — BP 118/62 | HR 73 | Ht 60.0 in | Wt 211.0 lb

## 2021-08-06 DIAGNOSIS — E785 Hyperlipidemia, unspecified: Secondary | ICD-10-CM

## 2021-08-06 DIAGNOSIS — I2510 Atherosclerotic heart disease of native coronary artery without angina pectoris: Secondary | ICD-10-CM | POA: Diagnosis not present

## 2021-08-06 DIAGNOSIS — I5022 Chronic systolic (congestive) heart failure: Secondary | ICD-10-CM

## 2021-08-06 NOTE — Progress Notes (Signed)
LIPID CLINIC CONSULT NOTE  Chief Complaint:  Follow-up dyslipidemia  Primary Care Physician: Nolene Ebbs, MD  Primary Cardiologist:  Quay Burow, MD  HPI:  Tracey Manning is a 60 y.o. female who is being seen today for the evaluation of dyslipidemia at the request of Nolene Ebbs, MD. This is a pleasant 60 year old female kindly referred for evaluation and management of dyslipidemia.  She has a history of coronary artery disease of multiple vessels with history of PCI.  She also has a presumed ischemic cardiomyopathy with LVEF of 40 to 45% recently but as low as 25% followed by the advanced heart failure clinic.  She was referred today for evaluation management of dyslipidemia.  She has concerns today about headaches.  She said this it started after starting isosorbide.  She has been taking the medicine intermittently because of persistent migraine  From a cholesterol standpoint, total cholesterol recently was 199, triglycerides 91, HDL 50 and LDL 133.  She is on high potency atorvastatin 40 mg daily for which she says she takes regularly.  She is also tried to improve her diet.  Unfortunately she continues to smoke and has difficulty in stopping.  08/06/2021  Tracey Manning returns today for follow-up.  I had recommended adding Repatha to her medical regimen.  She says she has been using this but was not able to get to the lab to have her blood work checked before this office visit.  Unfortunately we have not had any labs since November 2021 at which time her LDL remained above target at 133.  She seems to be tolerating the addition of Repatha well and I suspect she will be a goal however will have to go ahead and get labs drawn today.  Fortunately she is fasting.  PMHx:  Past Medical History:  Diagnosis Date   Anxiety    Arthritis    Asthma    Barrett's esophagus    Chest pain    Collagen vascular disease ([place])    Colon polyps    Coronary artery disease    Depression     Dyslipidemia    GERD (gastroesophageal reflux disease)    occ   Hyperlipidemia    Hypertension    Myocardial infarct ([place]) [id number]   Sciatica    Sleep apnea    no CPAP ordered but using oxygen at bedtime   Tobacco abuse     Past Surgical History:  Procedure Laterality Date   CARDIAC CATHETERIZATION  10/09/2003   [facility] [place]) - LAD with 30% prox narrowing, 50% stenosis in mid-portion of PLA; RCA with 40% narrowing proximally ([name], [name])   CARDIAC CATHETERIZATION  10/10/2007   70% stenosis in first septal perforator branch of LAD, 60-70% narrowing in mid LAD, 20% narrowing in mid AV groove Cfx with 80% diffuse narrowing in small distal marginal, total occlusion of mid RCA with L to R collaterals, 90% stenosis diffusely in prox branch of RCA followed by 70% stenosis in secondary curve & 80% stenosis in small marginal branch ([name])   CARDIAC CATHETERIZATION  05/28/2008   normal L main, RCA with 100% prox lesion w/distal filling from LAD collaterals, LAD with 20% prox tubular lesion/ 40% mid LAD lesion/previous stent patent ([name])   CARDIAC CATHETERIZATION  09/15/2009   discrete 100% osital RCA lesion, 50% prox LAD lesion, non-obstructive disease in all coronaries ([name])   [place]  COLONOSCOPY W/ POLYPECTOMY     CORONARY ANGIOPLASTY WITH STENT PLACEMENT  10/31/2007   PCI of distal Cfx with 2.25x31m Taxus Adam DES, 60% narrowing of mid LAD (Dr. TCorky Downs   CORONARY ANGIOPLASTY WITH STENT PLACEMENT  06/11/2011   PCI of prox-mid LAD with 3x122mDES Resolute (Dr. T.[name]  CORONARY STENT INTERVENTION N/A 06/19/2019   Procedure: CORONARY STENT INTERVENTION;  Surgeon: BeLorretta HarpMD;  [facility];  Service: Cardiovascular;  Laterality: N/A;  LAD   DILATION AND CURETTAGE, DIAGNOSTIC / THERAPEUTIC     DILITATION & CURRETTAGE/HYSTROSCOPY WITH HYDROTHERMAL ABLATION N/A 04/03/2016    Procedure: DILATATION & CURETTAGE/HYSTEROSCOPY WITH HYDROTHERMAL ABLATION;  Surgeon: ChShelly BombardMD;  [facility];  Service: Gynecology;  Laterality: N/A;   NM MYOCAR PERF WALL MOTION  08/2009   persantine myoview - normal perfusion in all regions, perfusion defect in anterior region (breast attenuation), EF 52%, low risk scan   RIGHT/LEFT HEART CATH AND CORONARY ANGIOGRAPHY N/A 06/15/2019   Procedure: RIGHT/LEFT HEART CATH AND CORONARY ANGIOGRAPHY;  Surgeon: SmBelva CromeMD;  [facility];  Service: Cardiovascular;  Laterality: N/A;    FAMHx:  Family History  Problem Relation Age of Onset   Leukemia Mother    Clotting disorder Father        blood clot   Hypertension Sister    Diabetes Sister    Stroke Sister [id number] Bleeding Disorder Son        ITP "free bleeding disorder"   Colon cancer Paternal Grandmother    Diabetes Paternal Grandmother    Stomach cancer Neg Hx    Pancreatic cancer Neg Hx     SOCHx:   reports that she has been smoking cigarettes. She has a 17.00 pack-year smoking history. She has never used smokeless tobacco. She reports current drug use. Drug: Marijuana. She reports that she does not drink alcohol.  ALLERGIES:  Allergies  Allergen Reactions   Coreg [Carvedilol] Other (See Comments)    Headaches   Strawberry Extract Anaphylaxis, Hives, Swelling and Other (See Comments)    "Fresh strawberries" made the throat swell (2014)   Imdur [Isosorbide Nitrate] Other (See Comments)    Headaches    Amoxicillin Nausea And Vomiting    Did it involve swelling of the face/tongue/throat, SOB, or low BP? Yes Did it involve sudden or severe rash/hives, skin peeling, or any reaction on the inside of your mouth or nose? Yes Did you need to seek medical attention at a hospital or doctor's office? Yes When did it last happen?      within last 10 years If all above answers are "NO", may proceed with cephalosporin use.    Dilaudid [Hydromorphone Hcl]  Nausea And Vomiting   Ibuprofen Other (See Comments)    Wheezing    Latex Swelling and Other (See Comments)    No reaction with Band-Aids, though   Metoprolol Other (See Comments)    Headaches and wheezing    ROS: Pertinent items noted in HPI and remainder of comprehensive ROS otherwise negative.  HOME MEDS: Current Outpatient Medications on File Prior to Visit  Medication Sig Dispense Refill   albuterol (PROVENTIL HFA) 108 (90 Base) MCG/ACT inhaler Inhale 2 puffs by mouth every 4 hours as needed 6.7 g 5   albuterol (PROVENTIL) (2.5 MG/3ML) 0.083% nebulizer solution INHALE 1 VIAL VIA NEBULIZER EVERY 6 HOURS AS NEEDED 90 mL 11   aspirin 81 MG EC tablet Take  1 tablet (81 mg total) by mouth daily. 90 tablet 3   atorvastatin (LIPITOR) 80 MG tablet Take 1 tablet (80 mg total) by mouth daily. 90 tablet 2   benzonatate (TESSALON) 100 MG capsule Take 1 capsule (100 mg total) by mouth 2 (two) times daily. 20 capsule 0   bisoprolol (ZEBETA) 5 MG tablet Take 0.5 tablets (2.5 mg total) by mouth daily. 45 tablet 3   cetirizine (ZYRTEC ALLERGY) 10 MG tablet Take 1 tablet (10 mg total) by mouth daily as needed 90 tablet 2   cetirizine (ZYRTEC) 10 MG tablet TAKE 1 TABLET BY MOUTH ONCE A DAY AS NEEDED 90 tablet 2   clopidogrel (PLAVIX) 75 MG tablet Take 1 tablet (75 mg total) by mouth daily. 90 tablet 2   clotrimazole-betamethasone (LOTRISONE) cream Apply 2 times a day as needed 30 g 2   dapagliflozin propanediol (FARXIGA) 10 MG TABS tablet TAKE 1 TABLET BY MOUTH DAILY BEFORE BREAKFAST 90 tablet 3   Evolocumab 140 MG/ML SOAJ INJECT 1 DOSE INTO THE SKIN EVERY 14 (FOURTEEN) DAYS. 2 mL 5   Fluticasone-Umeclidin-Vilant (TRELEGY ELLIPTA) 200-62.5-25 MCG/INH AEPB Inhale 1 puff into the lungs daily as needed (wheezing/sob).     furosemide (LASIX) 40 MG tablet TAKE 1 TABLET BY MOUTH DAILY. 90 tablet 3   hydrOXYzine (ATARAX/VISTARIL) 25 MG tablet Take 0.5-1 tablets (12.5-25 mg total) by mouth every 8 (eight)  hours as needed for itching. 30 tablet 0   LORazepam (ATIVAN) 0.5 MG tablet Take 1-2 tablets by mouth 30 - 60 min prior to MRI. Do not drive with this medicine. 4 tablet 0   nitroGLYCERIN (NITROSTAT) 0.4 MG SL tablet DISSOLVE 1 TABLET UNDER THE TONGUE AS NEEDED AS DIRECTED 25 tablet 5   oxyCODONE-acetaminophen (PERCOCET) 5-325 MG tablet Take 1 tablet by mouth every 4 (four) hours as needed for severe pain. 20 tablet 0   potassium chloride (KLOR-CON) 10 MEQ tablet Take 2 tablets (20 mEq total) by mouth 2 (two) times daily. 120 tablet 11   pregabalin (LYRICA) 75 MG capsule Take 1 capsule (75 mg total) by mouth 2 (two) times daily as needed (nerve pain). 60 capsule 3   sacubitril-valsartan (ENTRESTO) 97-103 MG Take 1 tablet by mouth 2 (two) times daily. 180 tablet 3   spironolactone (ALDACTONE) 25 MG tablet TAKE 1 TABLET BY MOUTH DAILY 30 tablet 3   triamcinolone cream (KENALOG) 0.1 % Apply 1 application topically 2 (two) times daily. 60 g 0   Vitamin D, Ergocalciferol, (DRISDOL) 1.25 MG (50000 UNIT) CAPS capsule TAKE 1 CAPSULE BY MOUTH ONCE A WEEK 4 capsule 5   zolpidem (AMBIEN) 10 MG tablet Take 1 tablet (10 mg total) by mouth at bedtime as needed for sleep 30 tablet 0   [DISCONTINUED] potassium chloride (KLOR-CON) 10 MEQ tablet TAKE 2 TABLETS BY MOUTH 2 TIMES DAILY (Patient taking differently: Take 20 mEq by mouth 2 (two) times daily.) 120 tablet 3   No current facility-administered medications on file prior to visit.    LABS/IMAGING: No results found for this or any previous visit (from the past 48 hour(s)). No results found.  LIPID PANEL:    Component Value Date/Time   CHOL 199 10/11/2020 1119   TRIG 91 10/11/2020 1119   HDL 50 10/11/2020 1119   CHOLHDL 4.0 10/11/2020 1119   CHOLHDL 3.9 06/14/2019 0216   VLDL 32 06/14/2019 0216   LDLCALC 133 (H) 10/11/2020 1119    WEIGHTS: Wt Readings from Last 3 Encounters:  08/06/21 211 lb (  95.7 kg)  08/06/21 212 lb (96.2 kg)  07/23/21 213 lb  12.8 oz (97 kg)    VITALS: BP 118/62   Pulse 73   Ht 5' (1.524 m)   Wt 211 lb (95.7 kg)   LMP  (LMP Unknown)   SpO2 97%   BMI 41.21 kg/m   EXAM: Deferred  EKG: Deferred  ASSESSMENT: Mixed dyslipidemia, goal LDL less than 70 Multivessel coronary artery disease with prior PCI Ischemic cardiomyopathy LVEF 40 to 45% (05/2020) Ongoing tobacco use  PLAN: 1.   Tracey Manning seems to be tolerating Repatha and had her labs drawn at [facility] the day of the appointment.  Those have just become available as of the time of this dictation and indicate total cholesterol has improved to 130, triglycerides are high at 263, HDL 42 and LDL 47.  This may not have been a fasting study but does demonstrate significant improvement in LDL cholesterol.  We will continue her current treatments.  Plan follow-up with me annually or sooner as necessary.  Pixie Casino, MD, [facility], [place] Director of the Advanced Lipid Disorders &  Cardiovascular Risk Reduction Clinic Diplomate of the American Board of Clinical Lipidology Attending Cardiologist  Direct Dial: [phone]  Fax: [phone]  Website:  [email or web address] 08/06/2021, 3:52 PM

## 2021-08-06 NOTE — Progress Notes (Signed)
Paramedicine Encounter    Patient ID: Tracey Manning, female    DOB: [birth year], 60 y.o.   MRN: [id number]   Patient Care Team: Nolene Ebbs, MD as PCP - General (Internal Medicine) [name], MD as PCP - Cardiology (Cardiology) Bensimhon, Shaune Pascal, MD as PCP - Advanced Heart Failure (Cardiology) Jorge Ny, LCSW as Social Worker (Licensed Clinical Social Worker)  Patient Active Problem List   Diagnosis Date Noted   Acute on chronic combined systolic (congestive) and diastolic (congestive) heart failure ([place]) 03/10/2021   Acute respiratory failure with hypoxia ([place]) 02/23/2021   Impingement syndrome of left shoulder 03/12/2020   Chronic pain 07/18/2019   CRI (chronic renal insufficiency), stage 3 (moderate) 07/18/2019   COPD (chronic obstructive pulmonary disease) ([place]) 06/28/2019   Obesity (BMI 30-39.9) 06/28/2019   Ischemic cardiomyopathy 06/28/2019   Dyspnea on exertion 99991111   Acute systolic CHF (congestive heart failure) ([place])    AKI (acute kidney injury) ([place])    Abnormal uterine bleeding (AUB) 04/03/2016   Dysfunctional uterine bleeding 11/14/2015   Bacterial vaginosis (recurrent) 11/14/2015   Atypical chest pain 11/14/2015   Essential hypertension 04/04/2014   Dyslipidemia, goal LDL below 70 04/04/2014   CAD S/P multiple PCIs 04/04/2014    Current Outpatient Medications:    albuterol (PROVENTIL HFA) 108 (90 Base) MCG/ACT inhaler, Inhale 2 puffs by mouth every 4 hours as needed, Disp: 6.7 g, Rfl: 5   albuterol (PROVENTIL) (2.5 MG/3ML) 0.083% nebulizer solution, INHALE 1 VIAL VIA NEBULIZER EVERY 6 HOURS AS NEEDED, Disp: 90 mL, Rfl: 11   aspirin 81 MG EC tablet, Take 1 tablet (81 mg total) by mouth daily., Disp: 90 tablet, Rfl: 3   atorvastatin (LIPITOR) 80 MG tablet, Take 1 tablet (80 mg total) by mouth daily., Disp: 90 tablet, Rfl: 2   benzonatate (TESSALON) 100 MG capsule, Take 1 capsule (100 mg total) by mouth 2 (two) times daily., Disp: 20 capsule,  Rfl: 0   bisoprolol (ZEBETA) 5 MG tablet, Take 0.5 tablets (2.5 mg total) by mouth daily., Disp: 45 tablet, Rfl: 3   cetirizine (ZYRTEC ALLERGY) 10 MG tablet, Take 1 tablet (10 mg total) by mouth daily as needed, Disp: 90 tablet, Rfl: 2   cetirizine (ZYRTEC) 10 MG tablet, TAKE 1 TABLET BY MOUTH ONCE A DAY AS NEEDED, Disp: 90 tablet, Rfl: 2   clopidogrel (PLAVIX) 75 MG tablet, Take 1 tablet (75 mg total) by mouth daily., Disp: 90 tablet, Rfl: 2   clotrimazole-betamethasone (LOTRISONE) cream, Apply 2 times a day as needed, Disp: 30 g, Rfl: 2   dapagliflozin propanediol (FARXIGA) 10 MG TABS tablet, TAKE 1 TABLET BY MOUTH DAILY BEFORE BREAKFAST, Disp: 90 tablet, Rfl: 3   Evolocumab 140 MG/ML SOAJ, INJECT 1 DOSE INTO THE SKIN EVERY 14 (FOURTEEN) DAYS., Disp: 2 mL, Rfl: 5   Fluticasone-Umeclidin-Vilant (TRELEGY ELLIPTA) 200-62.5-25 MCG/INH AEPB, Inhale 1 puff into the lungs daily as needed (wheezing/sob)., Disp: , Rfl:    furosemide (LASIX) 40 MG tablet, TAKE 1 TABLET BY MOUTH DAILY., Disp: 90 tablet, Rfl: 3   hydrOXYzine (ATARAX/VISTARIL) 25 MG tablet, Take 0.5-1 tablets (12.5-25 mg total) by mouth every 8 (eight) hours as needed for itching., Disp: 30 tablet, Rfl: 0   LORazepam (ATIVAN) 0.5 MG tablet, Take 1-2 tablets by mouth 30 - 60 min prior to MRI. Do not drive with this medicine. (Patient not taking: No sig reported), Disp: 4 tablet, Rfl: 0   nitroGLYCERIN (NITROSTAT) 0.4 MG SL tablet, DISSOLVE 1 TABLET UNDER THE  TONGUE AS NEEDED AS DIRECTED, Disp: 25 tablet, Rfl: 5   oxyCODONE-acetaminophen (PERCOCET) 5-325 MG tablet, Take 1 tablet by mouth every 4 (four) hours as needed for severe pain., Disp: 20 tablet, Rfl: 0   potassium chloride (KLOR-CON) 10 MEQ tablet, Take 2 tablets (20 mEq total) by mouth 2 (two) times daily., Disp: 120 tablet, Rfl: 11   pregabalin (LYRICA) 75 MG capsule, Take 1 capsule (75 mg total) by mouth 2 (two) times daily as needed (nerve pain)., Disp: 60 capsule, Rfl: 3    sacubitril-valsartan (ENTRESTO) 97-103 MG, Take 1 tablet by mouth 2 (two) times daily., Disp: 180 tablet, Rfl: 3   spironolactone (ALDACTONE) 25 MG tablet, TAKE 1 TABLET BY MOUTH DAILY, Disp: 30 tablet, Rfl: 3   triamcinolone cream (KENALOG) 0.1 %, Apply 1 application topically 2 (two) times daily., Disp: 60 g, Rfl: 0   Vitamin D, Ergocalciferol, (DRISDOL) 1.25 MG (50000 UNIT) CAPS capsule, TAKE 1 CAPSULE BY MOUTH ONCE A WEEK, Disp: 4 capsule, Rfl: 5   zolpidem (AMBIEN) 10 MG tablet, Take 1 tablet (10 mg total) by mouth at bedtime as needed for sleep, Disp: 30 tablet, Rfl: 0 Allergies  Allergen Reactions   Coreg [Carvedilol] Other (See Comments)    Headaches   Strawberry Extract Anaphylaxis, Hives, Swelling and Other (See Comments)    "Fresh strawberries" made the throat swell (2014)   Imdur [Isosorbide Nitrate] Other (See Comments)    Headaches    Amoxicillin Nausea And Vomiting    Did it involve swelling of the face/tongue/throat, SOB, or low BP? Yes Did it involve sudden or severe rash/hives, skin peeling, or any reaction on the inside of your mouth or nose? Yes Did you need to seek medical attention at a hospital or doctor's office? Yes When did it last happen?      within last 10 years If all above answers are "NO", may proceed with cephalosporin use.    Dilaudid [Hydromorphone Hcl] Nausea And Vomiting   Ibuprofen Other (See Comments)    Wheezing    Latex Swelling and Other (See Comments)    No reaction with Band-Aids, though   Metoprolol Other (See Comments)    Headaches and wheezing     Social History   Socioeconomic History   Marital status: Single    Spouse name: Not on file   Number of children: 2   Years of education: 12   Highest education level: High school graduate  Occupational History   Not on file  Tobacco Use   Smoking status: Every Day    Packs/day: 0.50    Years: 34.00    Pack years: 17.00    Types: Cigarettes   Smokeless tobacco: Never  Vaping Use    Vaping Use: Never used  Substance and Sexual Activity   Alcohol use: No    Alcohol/week: 0.0 standard drinks    Comment: seldom   Drug use: Yes    Types: Marijuana    Comment: 06/19/19   Sexual activity: Yes    Partners: Male    Birth control/protection: Post-menopausal  Other Topics Concern   Not on file  Social History Narrative   Not on file   Social Determinants of Health   Financial Resource Strain: Medium Risk   Difficulty of Paying Living Expenses: Somewhat hard  Food Insecurity: Food Insecurity Present   Worried About Running Out of Food in the Last Year: Sometimes true   Ran Out of Food in the Last Year: Sometimes true  Transportation  Needs: Unmet Transportation Needs   Lack of Transportation (Medical): No   Lack of Transportation (Non-Medical): Yes  Physical Activity: Not on file  Stress: Not on file  Social Connections: Not on file  Intimate Partner Violence: Not on file    Physical Exam Vitals reviewed.  Constitutional:      Appearance: Normal appearance. She is normal weight.  HENT:     Head: Normocephalic.     Nose: Nose normal.     Mouth/Throat:     Mouth: Mucous membranes are moist.     Pharynx: Oropharynx is clear.  Eyes:     Conjunctiva/sclera: Conjunctivae normal.     Pupils: Pupils are equal, round, and reactive to light.  Cardiovascular:     Rate and Rhythm: Normal rate and regular rhythm.     Pulses: Normal pulses.     Heart sounds: Normal heart sounds.  Pulmonary:     Effort: Pulmonary effort is normal.     Breath sounds: Normal breath sounds.  Abdominal:     General: Abdomen is flat.     Palpations: Abdomen is soft.  Musculoskeletal:        General: No swelling. Normal range of motion.     Cervical back: Normal range of motion.     Left lower leg: No edema.  Skin:    General: Skin is warm and dry.     Capillary Refill: Capillary refill takes less than 2 seconds.  Neurological:     General: No focal deficit present.     Mental  Status: She is alert. Mental status is at baseline.  Psychiatric:        Mood and Affect: Mood normal.   Arrived for home visit for Tracey Manning who was alert and oriented reporting to be feeling good with no complaints of shortness of breath, chest pain, dizziness or med compliance. Tracey Manning is now getting bubble packs delivered from [employer]. I verified pill packs to list and it is accurate. Tracey Manning has been taking her medications correctly.   Vitals and assessment as noted in report. No swelling, edema noted. Lung sounds clear.   Tracey Manning and I reviewed upcoming appointments. She is to see Dr. Debara Pickett later today.   Tracey Manning has not received Repatha yet, I will notify pharmacy to deliver same.   Home visit complete. I will return once she gets Repatha.        Future Appointments  Date Time Provider [place]  08/06/2021 11:15 AM Hilty, Nadean Corwin, MD [facility]  10/01/2021 11:35 AM [id number]     ACTION: Home visit completed

## 2021-08-06 NOTE — Patient Instructions (Signed)
Medication Instructions:  Your physician recommends that you continue on your current medications as directed. Please refer to the Current Medication list given to you today.  *If you need a refill on your cardiac medications before your next appointment, please call your pharmacy*   Lab Work: Lipid today: Your provider has recommended lab work. Please have this collected at [facility] [place]. The lab is open 8:00 am - 4:30 pm. Please avoid 12:00p - 1:00p for lunch hour. You do not need an appointment. Please go to [address] [place], [place] [address]. This is in the Primary Care office on the 3rd floor, let them know you are there for blood work and they will direct you to the lab.  Follow-Up: At [facility], you and your health needs are our priority.  As part of our continuing mission to provide you with exceptional heart care, we have created designated Provider Care Teams.  These Care Teams include your primary Cardiologist (physician) and Advanced Practice Providers (APPs -  Physician Assistants and Nurse Practitioners) who all work together to provide you with the care you need, when you need it.  We recommend signing up for the patient portal called "MyChart".  Sign up information is provided on this After Visit Summary.  MyChart is used to connect with patients for Virtual Visits (Telemedicine).  Patients are able to view lab/test results, encounter notes, upcoming appointments, etc.  Non-urgent messages can be sent to your provider as well.   To learn more about what you can do with MyChart, go to [email or web address].    Your next appointment:   12 month(s)  The format for your next appointment:   In Person  Provider:   K. Mali Hilty, MD

## 2021-08-07 LAB — LIPID PANEL
Chol/HDL Ratio: 3.1 ratio (ref 0.0–4.4)
Cholesterol, Total: 130 mg/dL (ref 100–199)
HDL: 42 mg/dL (ref >39)
LDL Chol Calc (NIH): 47 mg/dL (ref 0–99)
Triglycerides: 263 mg/dL — ABNORMAL HIGH (ref 0–149)
VLDL Cholesterol Cal: 41 mg/dL — ABNORMAL HIGH (ref 5–40)

## 2021-08-07 NOTE — Addendum Note (Signed)
Addended by: Fidel Levy on: 08/07/2021 03:18 PM   Modules accepted: Orders

## 2021-08-08 ENCOUNTER — Encounter: Payer: Self-pay | Admitting: Internal Medicine

## 2021-08-26 ENCOUNTER — Other Ambulatory Visit (HOSPITAL_COMMUNITY): Payer: Self-pay

## 2021-08-26 ENCOUNTER — Other Ambulatory Visit (HOSPITAL_COMMUNITY): Payer: Self-pay | Admitting: Adult Health

## 2021-08-26 NOTE — Progress Notes (Signed)
Paramedicine Encounter    Patient ID: Tracey Manning, female    DOB: 13-Jul-1961, 60 y.o.   MRN: [id number]   Patient Care Team: Nolene Ebbs, MD as PCP - General (Internal Medicine) [name], MD as PCP - Cardiology (Cardiology) Bensimhon, Shaune Pascal, MD as PCP - Advanced Heart Failure (Cardiology) Jorge Ny, LCSW as Social Worker (Licensed Clinical Social Worker)  Patient Active Problem List   Diagnosis Date Noted   Acute on chronic combined systolic (congestive) and diastolic (congestive) heart failure ([place]) 03/10/2021   Acute respiratory failure with hypoxia ([place]) 02/23/2021   Impingement syndrome of left shoulder 03/12/2020   Chronic pain 07/18/2019   CRI (chronic renal insufficiency), stage 3 (moderate) 07/18/2019   COPD (chronic obstructive pulmonary disease) ([place]) 06/28/2019   Obesity (BMI 30-39.9) 06/28/2019   Ischemic cardiomyopathy 06/28/2019   Dyspnea on exertion 25/42/7062   Acute systolic CHF (congestive heart failure) ([place])    AKI (acute kidney injury) ([place])    Abnormal uterine bleeding (AUB) 04/03/2016   Dysfunctional uterine bleeding 11/14/2015   Bacterial vaginosis (recurrent) 11/14/2015   Atypical chest pain 11/14/2015   Essential hypertension 04/04/2014   Dyslipidemia, goal LDL below 70 04/04/2014   CAD S/P multiple PCIs 04/04/2014    Current Outpatient Medications:    albuterol (PROVENTIL HFA) 108 (90 Base) MCG/ACT inhaler, Inhale 2 puffs by mouth every 4 hours as needed, Disp: 6.7 g, Rfl: 5   albuterol (PROVENTIL) (2.5 MG/3ML) 0.083% nebulizer solution, INHALE 1 VIAL VIA NEBULIZER EVERY 6 HOURS AS NEEDED, Disp: 90 mL, Rfl: 11   aspirin 81 MG EC tablet, Take 1 tablet (81 mg total) by mouth daily., Disp: 90 tablet, Rfl: 3   atorvastatin (LIPITOR) 80 MG tablet, Take 1 tablet (80 mg total) by mouth daily., Disp: 90 tablet, Rfl: 2   benzonatate (TESSALON) 100 MG capsule, Take 1 capsule (100 mg total) by mouth 2 (two) times daily., Disp: 20 capsule,  Rfl: 0   bisoprolol (ZEBETA) 5 MG tablet, Take 0.5 tablets (2.5 mg total) by mouth daily., Disp: 45 tablet, Rfl: 3   cetirizine (ZYRTEC ALLERGY) 10 MG tablet, Take 1 tablet (10 mg total) by mouth daily as needed, Disp: 90 tablet, Rfl: 2   cetirizine (ZYRTEC) 10 MG tablet, TAKE 1 TABLET BY MOUTH ONCE A DAY AS NEEDED, Disp: 90 tablet, Rfl: 2   clopidogrel (PLAVIX) 75 MG tablet, Take 1 tablet (75 mg total) by mouth daily., Disp: 90 tablet, Rfl: 2   clotrimazole-betamethasone (LOTRISONE) cream, Apply 2 times a day as needed, Disp: 30 g, Rfl: 2   dapagliflozin propanediol (FARXIGA) 10 MG TABS tablet, TAKE 1 TABLET BY MOUTH DAILY BEFORE BREAKFAST, Disp: 90 tablet, Rfl: 3   Evolocumab 140 MG/ML SOAJ, INJECT 1 DOSE INTO THE SKIN EVERY 14 (FOURTEEN) DAYS., Disp: 2 mL, Rfl: 5   Fluticasone-Umeclidin-Vilant (TRELEGY ELLIPTA) 200-62.5-25 MCG/INH AEPB, Inhale 1 puff into the lungs daily as needed (wheezing/sob)., Disp: , Rfl:    furosemide (LASIX) 40 MG tablet, TAKE 1 TABLET BY MOUTH DAILY., Disp: 90 tablet, Rfl: 3   hydrOXYzine (ATARAX/VISTARIL) 25 MG tablet, Take 0.5-1 tablets (12.5-25 mg total) by mouth every 8 (eight) hours as needed for itching., Disp: 30 tablet, Rfl: 0   LORazepam (ATIVAN) 0.5 MG tablet, Take 1-2 tablets by mouth 30 - 60 min prior to MRI. Do not drive with this medicine., Disp: 4 tablet, Rfl: 0   nitroGLYCERIN (NITROSTAT) 0.4 MG SL tablet, DISSOLVE 1 TABLET UNDER THE TONGUE AS NEEDED AS DIRECTED, Disp:  25 tablet, Rfl: 5   oxyCODONE-acetaminophen (PERCOCET) 5-325 MG tablet, Take 1 tablet by mouth every 4 (four) hours as needed for severe pain., Disp: 20 tablet, Rfl: 0   potassium chloride (KLOR-CON) 10 MEQ tablet, Take 2 tablets (20 mEq total) by mouth 2 (two) times daily., Disp: 120 tablet, Rfl: 11   pregabalin (LYRICA) 75 MG capsule, Take 1 capsule (75 mg total) by mouth 2 (two) times daily as needed (nerve pain)., Disp: 60 capsule, Rfl: 3   sacubitril-valsartan (ENTRESTO) 97-103 MG, Take  1 tablet by mouth 2 (two) times daily., Disp: 180 tablet, Rfl: 3   spironolactone (ALDACTONE) 25 MG tablet, TAKE 1 TABLET BY MOUTH DAILY (AM), Disp: 30 tablet, Rfl: 3   triamcinolone cream (KENALOG) 0.1 %, Apply 1 application topically 2 (two) times daily., Disp: 60 g, Rfl: 0   Vitamin D, Ergocalciferol, (DRISDOL) 1.25 MG (50000 UNIT) CAPS capsule, TAKE 1 CAPSULE BY MOUTH ONCE A WEEK, Disp: 4 capsule, Rfl: 5   zolpidem (AMBIEN) 10 MG tablet, Take 1 tablet (10 mg total) by mouth at bedtime as needed for sleep, Disp: 30 tablet, Rfl: 0 Allergies  Allergen Reactions   Coreg [Carvedilol] Other (See Comments)    Headaches   Strawberry Extract Anaphylaxis, Hives, Swelling and Other (See Comments)    "Fresh strawberries" made the throat swell (2014)   Imdur [Isosorbide Nitrate] Other (See Comments)    Headaches    Amoxicillin Nausea And Vomiting    Did it involve swelling of the face/tongue/throat, SOB, or low BP? Yes Did it involve sudden or severe rash/hives, skin peeling, or any reaction on the inside of your mouth or nose? Yes Did you need to seek medical attention at a hospital or doctor's office? Yes When did it last happen?      within last 10 years If all above answers are "NO", may proceed with cephalosporin use.    Dilaudid [Hydromorphone Hcl] Nausea And Vomiting   Ibuprofen Other (See Comments)    Wheezing    Latex Swelling and Other (See Comments)    No reaction with Band-Aids, though   Metoprolol Other (See Comments)    Headaches and wheezing     Social History   Socioeconomic History   Marital status: Single    Spouse name: Not on file   Number of children: 2   Years of education: 12   Highest education level: High school graduate  Occupational History   Not on file  Tobacco Use   Smoking status: Every Day    Packs/day: 0.50    Years: 34.00    Pack years: 17.00    Types: Cigarettes   Smokeless tobacco: Never  Vaping Use   Vaping Use: Never used  Substance and  Sexual Activity   Alcohol use: No    Alcohol/week: 0.0 standard drinks    Comment: seldom   Drug use: Yes    Types: Marijuana    Comment: 06/19/19   Sexual activity: Yes    Partners: Male    Birth control/protection: Post-menopausal  Other Topics Concern   Not on file  Social History Narrative   Not on file   Social Determinants of Health   Financial Resource Strain: Medium Risk   Difficulty of Paying Living Expenses: Somewhat hard  Food Insecurity: Food Insecurity Present   Worried About Running Out of Food in the Last Year: Sometimes true   Ran Out of Food in the Last Year: Sometimes true  Transportation Needs: Unmet Transportation Needs  Lack of Transportation (Medical): No   Lack of Transportation (Non-Medical): Yes  Physical Activity: Not on file  Stress: Not on file  Social Connections: Not on file  Intimate Partner Violence: Not on file    Physical Exam Vitals reviewed.  Constitutional:      Appearance: Normal appearance. She is normal weight.  HENT:     Head: Normocephalic.     Nose: Nose normal.     Mouth/Throat:     Mouth: Mucous membranes are moist.     Pharynx: Oropharynx is clear.  Eyes:     Conjunctiva/sclera: Conjunctivae normal.     Pupils: Pupils are equal, round, and reactive to light.  Cardiovascular:     Rate and Rhythm: Normal rate and regular rhythm.     Pulses: Normal pulses.     Heart sounds: Normal heart sounds.  Pulmonary:     Effort: Pulmonary effort is normal.     Breath sounds: Normal breath sounds.  Abdominal:     General: Abdomen is flat.     Palpations: Abdomen is soft.  Musculoskeletal:        General: No swelling. Normal range of motion.     Cervical back: Normal range of motion.     Right lower leg: No edema.     Left lower leg: No edema.  Skin:    General: Skin is warm and dry.     Capillary Refill: Capillary refill takes less than 2 seconds.  Neurological:     General: No focal deficit present.     Mental Status: She  is alert. Mental status is at baseline.  Psychiatric:        Mood and Affect: Mood normal.    Arrived for home visit for Tracey Manning who is alert and oriented reporting to be feeling great with no complaints today. Tracey Manning denied shortness of breath, dizziness, chest pain or having trouble taking her medications. Tracey Manning has been compliant with her medications except her Repatha due to lack of delivery from pharmacy. We completed injection today. Vitals and assessment as noted in report. Tracey Manning reminded of upcoming appointments. Bubble packs confirmed. Home visit complete I will see Tracey Manning in one month. She was able to administer her Repatha today on her own while I observed and understands how to do so and feels comfortable doing so going forward. Reminder written for her to do the next one on 10/16.       Future Appointments  Date Time Provider [place]  10/01/2021 11:35 AM [id number]     ACTION: Home visit completed

## 2021-09-16 ENCOUNTER — Other Ambulatory Visit (HOSPITAL_COMMUNITY): Payer: Self-pay

## 2021-09-16 NOTE — Progress Notes (Signed)
Paramedicine Encounter    Patient ID: Tracey Manning, female    DOB: 13-Jul-1961, 60 y.o.   MRN: [id number]   Patient Care Team: Nolene Ebbs, MD as PCP - General (Internal Medicine) [name], MD as PCP - Cardiology (Cardiology) Bensimhon, Shaune Pascal, MD as PCP - Advanced Heart Failure (Cardiology) Jorge Ny, LCSW as Social Worker (Licensed Clinical Social Worker)  Patient Active Problem List   Diagnosis Date Noted   Acute on chronic combined systolic (congestive) and diastolic (congestive) heart failure ([place]) 03/10/2021   Acute respiratory failure with hypoxia ([place]) 02/23/2021   Impingement syndrome of left shoulder 03/12/2020   Chronic pain 07/18/2019   CRI (chronic renal insufficiency), stage 3 (moderate) 07/18/2019   COPD (chronic obstructive pulmonary disease) ([place]) 06/28/2019   Obesity (BMI 30-39.9) 06/28/2019   Ischemic cardiomyopathy 06/28/2019   Dyspnea on exertion 25/42/7062   Acute systolic CHF (congestive heart failure) ([place])    AKI (acute kidney injury) ([place])    Abnormal uterine bleeding (AUB) 04/03/2016   Dysfunctional uterine bleeding 11/14/2015   Bacterial vaginosis (recurrent) 11/14/2015   Atypical chest pain 11/14/2015   Essential hypertension 04/04/2014   Dyslipidemia, goal LDL below 70 04/04/2014   CAD S/P multiple PCIs 04/04/2014    Current Outpatient Medications:    albuterol (PROVENTIL HFA) 108 (90 Base) MCG/ACT inhaler, Inhale 2 puffs by mouth every 4 hours as needed, Disp: 6.7 g, Rfl: 5   albuterol (PROVENTIL) (2.5 MG/3ML) 0.083% nebulizer solution, INHALE 1 VIAL VIA NEBULIZER EVERY 6 HOURS AS NEEDED, Disp: 90 mL, Rfl: 11   aspirin 81 MG EC tablet, Take 1 tablet (81 mg total) by mouth daily., Disp: 90 tablet, Rfl: 3   atorvastatin (LIPITOR) 80 MG tablet, Take 1 tablet (80 mg total) by mouth daily., Disp: 90 tablet, Rfl: 2   benzonatate (TESSALON) 100 MG capsule, Take 1 capsule (100 mg total) by mouth 2 (two) times daily., Disp: 20 capsule,  Rfl: 0   bisoprolol (ZEBETA) 5 MG tablet, Take 0.5 tablets (2.5 mg total) by mouth daily., Disp: 45 tablet, Rfl: 3   cetirizine (ZYRTEC ALLERGY) 10 MG tablet, Take 1 tablet (10 mg total) by mouth daily as needed, Disp: 90 tablet, Rfl: 2   cetirizine (ZYRTEC) 10 MG tablet, TAKE 1 TABLET BY MOUTH ONCE A DAY AS NEEDED, Disp: 90 tablet, Rfl: 2   clopidogrel (PLAVIX) 75 MG tablet, Take 1 tablet (75 mg total) by mouth daily., Disp: 90 tablet, Rfl: 2   clotrimazole-betamethasone (LOTRISONE) cream, Apply 2 times a day as needed, Disp: 30 g, Rfl: 2   dapagliflozin propanediol (FARXIGA) 10 MG TABS tablet, TAKE 1 TABLET BY MOUTH DAILY BEFORE BREAKFAST, Disp: 90 tablet, Rfl: 3   Evolocumab 140 MG/ML SOAJ, INJECT 1 DOSE INTO THE SKIN EVERY 14 (FOURTEEN) DAYS., Disp: 2 mL, Rfl: 5   Fluticasone-Umeclidin-Vilant (TRELEGY ELLIPTA) 200-62.5-25 MCG/INH AEPB, Inhale 1 puff into the lungs daily as needed (wheezing/sob)., Disp: , Rfl:    furosemide (LASIX) 40 MG tablet, TAKE 1 TABLET BY MOUTH DAILY., Disp: 90 tablet, Rfl: 3   hydrOXYzine (ATARAX/VISTARIL) 25 MG tablet, Take 0.5-1 tablets (12.5-25 mg total) by mouth every 8 (eight) hours as needed for itching., Disp: 30 tablet, Rfl: 0   LORazepam (ATIVAN) 0.5 MG tablet, Take 1-2 tablets by mouth 30 - 60 min prior to MRI. Do not drive with this medicine., Disp: 4 tablet, Rfl: 0   nitroGLYCERIN (NITROSTAT) 0.4 MG SL tablet, DISSOLVE 1 TABLET UNDER THE TONGUE AS NEEDED AS DIRECTED, Disp:  25 tablet, Rfl: 5   oxyCODONE-acetaminophen (PERCOCET) 5-325 MG tablet, Take 1 tablet by mouth every 4 (four) hours as needed for severe pain. (Patient not taking: Reported on 08/26/2021), Disp: 20 tablet, Rfl: 0   potassium chloride (KLOR-CON) 10 MEQ tablet, Take 2 tablets (20 mEq total) by mouth 2 (two) times daily., Disp: 120 tablet, Rfl: 11   pregabalin (LYRICA) 75 MG capsule, Take 1 capsule (75 mg total) by mouth 2 (two) times daily as needed (nerve pain)., Disp: 60 capsule, Rfl: 3    sacubitril-valsartan (ENTRESTO) 97-103 MG, Take 1 tablet by mouth 2 (two) times daily., Disp: 180 tablet, Rfl: 3   spironolactone (ALDACTONE) 25 MG tablet, TAKE 1 TABLET BY MOUTH DAILY (AM), Disp: 30 tablet, Rfl: 3   triamcinolone cream (KENALOG) 0.1 %, Apply 1 application topically 2 (two) times daily., Disp: 60 g, Rfl: 0   Vitamin D, Ergocalciferol, (DRISDOL) 1.25 MG (50000 UNIT) CAPS capsule, TAKE 1 CAPSULE BY MOUTH ONCE A WEEK, Disp: 4 capsule, Rfl: 5   zolpidem (AMBIEN) 10 MG tablet, Take 1 tablet (10 mg total) by mouth at bedtime as needed for sleep, Disp: 30 tablet, Rfl: 0 Allergies  Allergen Reactions   Coreg [Carvedilol] Other (See Comments)    Headaches   Strawberry Extract Anaphylaxis, Hives, Swelling and Other (See Comments)    "Fresh strawberries" made the throat swell (2014)   Imdur [Isosorbide Nitrate] Other (See Comments)    Headaches    Amoxicillin Nausea And Vomiting    Did it involve swelling of the face/tongue/throat, SOB, or low BP? Yes Did it involve sudden or severe rash/hives, skin peeling, or any reaction on the inside of your mouth or nose? Yes Did you need to seek medical attention at a hospital or doctor's office? Yes When did it last happen?      within last 10 years If all above answers are "NO", may proceed with cephalosporin use.    Dilaudid [Hydromorphone Hcl] Nausea And Vomiting   Ibuprofen Other (See Comments)    Wheezing    Latex Swelling and Other (See Comments)    No reaction with Band-Aids, though   Metoprolol Other (See Comments)    Headaches and wheezing     Social History   Socioeconomic History   Marital status: Single    Spouse name: Not on file   Number of children: 2   Years of education: 12   Highest education level: High school graduate  Occupational History   Not on file  Tobacco Use   Smoking status: Every Day    Packs/day: 0.50    Years: 34.00    Pack years: 17.00    Types: Cigarettes   Smokeless tobacco: Never   Vaping Use   Vaping Use: Never used  Substance and Sexual Activity   Alcohol use: No    Alcohol/week: 0.0 standard drinks    Comment: seldom   Drug use: Yes    Types: Marijuana    Comment: 06/19/19   Sexual activity: Yes    Partners: Male    Birth control/protection: Post-menopausal  Other Topics Concern   Not on file  Social History Narrative   Not on file   Social Determinants of Health   Financial Resource Strain: Medium Risk   Difficulty of Paying Living Expenses: Somewhat hard  Food Insecurity: Food Insecurity Present   Worried About Running Out of Food in the Last Year: Sometimes true   Ran Out of Food in the Last Year: Sometimes true  Transportation Needs: Public librarian (Medical): No   Lack of Transportation (Non-Medical): Yes  Physical Activity: Not on file  Stress: Not on file  Social Connections: Not on file  Intimate Partner Violence: Not on file    Physical Exam Vitals reviewed.  Constitutional:      Appearance: She is normal weight.  HENT:     Head: Normocephalic.     Nose: Nose normal.     Mouth/Throat:     Mouth: Mucous membranes are moist.     Pharynx: Oropharynx is clear.  Eyes:     Conjunctiva/sclera: Conjunctivae normal.     Pupils: Pupils are equal, round, and reactive to light.  Cardiovascular:     Rate and Rhythm: Normal rate and regular rhythm.     Pulses: Normal pulses.     Heart sounds: Normal heart sounds.  Pulmonary:     Effort: Pulmonary effort is normal.     Breath sounds: Normal breath sounds.  Abdominal:     General: Abdomen is flat.     Palpations: Abdomen is soft.  Musculoskeletal:        General: No swelling. Normal range of motion.     Cervical back: Normal range of motion.     Right lower leg: No edema.     Left lower leg: No edema.  Skin:    General: Skin is warm and dry.     Capillary Refill: Capillary refill takes less than 2 seconds.  Neurological:     General: No focal  deficit present.     Mental Status: She is alert. Mental status is at baseline.  Psychiatric:        Mood and Affect: Mood normal.        Behavior: Behavior normal.    Arrived for home visit for Tracey Manning who reports feeling okay just in pain from her arthritis. Tracey Manning denied chest pain, dizziness, shortness of breath or trouble sleeping. She has been taking all of her meds except her potassium due to her reporting they are too big and hard to swallow. She was getting the smaller round tabs but now she has the bigger ones in her pill bubble packs, we will talk with pharmacy to see options to get her back on her potassium.  Vitals and assessment as noted. No swelling, edema noted. Lung sounds clear. No JVD or abdominal distention.   Tracey Manning and I reviewed upcoming appointments, she has her transportation set up.   Home visit complete, I will return in one to two weeks.   Refill: Tracey Manning   Future Appointments  Date Time Provider [place]  10/01/2021 11:35 AM [id number]     ACTION: Home visit completed

## 2021-09-17 ENCOUNTER — Encounter (HOSPITAL_COMMUNITY): Payer: Self-pay | Admitting: Licensed Clinical Social Worker

## 2021-09-17 ENCOUNTER — Telehealth (HOSPITAL_COMMUNITY): Payer: Self-pay | Admitting: Licensed Clinical Social Worker

## 2021-09-17 NOTE — Telephone Encounter (Signed)
HF Paramedicine Team Based Care Meeting  HF MD- NA  HF NP - Sar[place] NP-C   [facility] admit within the last 30 days for heart failure? No  Medications concerns? On bubble packs- working on taking potassium due to concerns with swallowing.  Planning on getting her switched to smaller pills.  Transportation issues ? no  Education needs?  no  SDOH -no  Eligible for discharge?  Will be after figuring out how she can get potassium  Jorge Ny, [facility] Desk#: [phone] Cell#: [phone]

## 2021-09-22 ENCOUNTER — Telehealth (HOSPITAL_COMMUNITY): Payer: Self-pay | Admitting: Licensed Clinical Social Worker

## 2021-09-22 NOTE — Telephone Encounter (Signed)
CSW called pt to remind of [facility] women's group this Wednesday.  Pt reports having another appt that day so unable to attend- confirms she would like to remain on the mailing list for the group.  Jorge Ny, LCSW Clinical Social Worker [facility] Desk#: [phone] Cell#: [phone]

## 2021-10-01 ENCOUNTER — Other Ambulatory Visit: Payer: Self-pay

## 2021-10-01 ENCOUNTER — Ambulatory Visit (HOSPITAL_COMMUNITY): Payer: Medicaid Other | Attending: Cardiology

## 2021-10-01 DIAGNOSIS — I5021 Acute systolic (congestive) heart failure: Secondary | ICD-10-CM | POA: Insufficient documentation

## 2021-10-01 LAB — ECHOCARDIOGRAM COMPLETE
Area-P 1/2: 2.54 cm2
S' Lateral: 4.1 cm

## 2021-10-03 ENCOUNTER — Encounter: Payer: Self-pay | Admitting: *Deleted

## 2021-10-13 ENCOUNTER — Other Ambulatory Visit (HOSPITAL_COMMUNITY): Payer: Self-pay

## 2021-10-13 NOTE — Progress Notes (Signed)
Paramedicine Encounter    Patient ID: Tracey Manning, female    DOB: 13-Jul-1961, 60 y.o.   MRN: [id number]   Patient Care Team: Nolene Ebbs, MD as PCP - General (Internal Medicine) [name], MD as PCP - Cardiology (Cardiology) Bensimhon, Shaune Pascal, MD as PCP - Advanced Heart Failure (Cardiology) Jorge Ny, LCSW as Social Worker (Licensed Clinical Social Worker)  Patient Active Problem List   Diagnosis Date Noted   Acute on chronic combined systolic (congestive) and diastolic (congestive) heart failure ([place]) 03/10/2021   Acute respiratory failure with hypoxia ([place]) 02/23/2021   Impingement syndrome of left shoulder 03/12/2020   Chronic pain 07/18/2019   CRI (chronic renal insufficiency), stage 3 (moderate) 07/18/2019   COPD (chronic obstructive pulmonary disease) ([place]) 06/28/2019   Obesity (BMI 30-39.9) 06/28/2019   Ischemic cardiomyopathy 06/28/2019   Dyspnea on exertion 25/42/7062   Acute systolic CHF (congestive heart failure) ([place])    AKI (acute kidney injury) ([place])    Abnormal uterine bleeding (AUB) 04/03/2016   Dysfunctional uterine bleeding 11/14/2015   Bacterial vaginosis (recurrent) 11/14/2015   Atypical chest pain 11/14/2015   Essential hypertension 04/04/2014   Dyslipidemia, goal LDL below 70 04/04/2014   CAD S/P multiple PCIs 04/04/2014    Current Outpatient Medications:    albuterol (PROVENTIL HFA) 108 (90 Base) MCG/ACT inhaler, Inhale 2 puffs by mouth every 4 hours as needed, Disp: 6.7 g, Rfl: 5   albuterol (PROVENTIL) (2.5 MG/3ML) 0.083% nebulizer solution, INHALE 1 VIAL VIA NEBULIZER EVERY 6 HOURS AS NEEDED, Disp: 90 mL, Rfl: 11   aspirin 81 MG EC tablet, Take 1 tablet (81 mg total) by mouth daily., Disp: 90 tablet, Rfl: 3   atorvastatin (LIPITOR) 80 MG tablet, Take 1 tablet (80 mg total) by mouth daily., Disp: 90 tablet, Rfl: 2   benzonatate (TESSALON) 100 MG capsule, Take 1 capsule (100 mg total) by mouth 2 (two) times daily., Disp: 20 capsule,  Rfl: 0   bisoprolol (ZEBETA) 5 MG tablet, Take 0.5 tablets (2.5 mg total) by mouth daily., Disp: 45 tablet, Rfl: 3   cetirizine (ZYRTEC ALLERGY) 10 MG tablet, Take 1 tablet (10 mg total) by mouth daily as needed, Disp: 90 tablet, Rfl: 2   cetirizine (ZYRTEC) 10 MG tablet, TAKE 1 TABLET BY MOUTH ONCE A DAY AS NEEDED, Disp: 90 tablet, Rfl: 2   clopidogrel (PLAVIX) 75 MG tablet, Take 1 tablet (75 mg total) by mouth daily., Disp: 90 tablet, Rfl: 2   clotrimazole-betamethasone (LOTRISONE) cream, Apply 2 times a day as needed, Disp: 30 g, Rfl: 2   dapagliflozin propanediol (FARXIGA) 10 MG TABS tablet, TAKE 1 TABLET BY MOUTH DAILY BEFORE BREAKFAST, Disp: 90 tablet, Rfl: 3   Evolocumab 140 MG/ML SOAJ, INJECT 1 DOSE INTO THE SKIN EVERY 14 (FOURTEEN) DAYS., Disp: 2 mL, Rfl: 5   Fluticasone-Umeclidin-Vilant (TRELEGY ELLIPTA) 200-62.5-25 MCG/INH AEPB, Inhale 1 puff into the lungs daily as needed (wheezing/sob)., Disp: , Rfl:    furosemide (LASIX) 40 MG tablet, TAKE 1 TABLET BY MOUTH DAILY., Disp: 90 tablet, Rfl: 3   hydrOXYzine (ATARAX/VISTARIL) 25 MG tablet, Take 0.5-1 tablets (12.5-25 mg total) by mouth every 8 (eight) hours as needed for itching., Disp: 30 tablet, Rfl: 0   LORazepam (ATIVAN) 0.5 MG tablet, Take 1-2 tablets by mouth 30 - 60 min prior to MRI. Do not drive with this medicine., Disp: 4 tablet, Rfl: 0   nitroGLYCERIN (NITROSTAT) 0.4 MG SL tablet, DISSOLVE 1 TABLET UNDER THE TONGUE AS NEEDED AS DIRECTED, Disp:  25 tablet, Rfl: 5   oxyCODONE-acetaminophen (PERCOCET) 5-325 MG tablet, Take 1 tablet by mouth every 4 (four) hours as needed for severe pain. (Patient not taking: No sig reported), Disp: 20 tablet, Rfl: 0   potassium chloride (KLOR-CON) 10 MEQ tablet, Take 2 tablets (20 mEq total) by mouth 2 (two) times daily. (Patient not taking: Reported on 09/16/2021), Disp: 120 tablet, Rfl: 11   pregabalin (LYRICA) 75 MG capsule, Take 1 capsule (75 mg total) by mouth 2 (two) times daily as needed (nerve  pain)., Disp: 60 capsule, Rfl: 3   sacubitril-valsartan (ENTRESTO) 97-103 MG, Take 1 tablet by mouth 2 (two) times daily., Disp: 180 tablet, Rfl: 3   spironolactone (ALDACTONE) 25 MG tablet, TAKE 1 TABLET BY MOUTH DAILY (AM), Disp: 30 tablet, Rfl: 3   triamcinolone cream (KENALOG) 0.1 %, Apply 1 application topically 2 (two) times daily., Disp: 60 g, Rfl: 0   Vitamin D, Ergocalciferol, (DRISDOL) 1.25 MG (50000 UNIT) CAPS capsule, TAKE 1 CAPSULE BY MOUTH ONCE A WEEK, Disp: 4 capsule, Rfl: 5   zolpidem (AMBIEN) 10 MG tablet, Take 1 tablet (10 mg total) by mouth at bedtime as needed for sleep, Disp: 30 tablet, Rfl: 0 Allergies  Allergen Reactions   Coreg [Carvedilol] Other (See Comments)    Headaches   Strawberry Extract Anaphylaxis, Hives, Swelling and Other (See Comments)    "Fresh strawberries" made the throat swell (2014)   Imdur [Isosorbide Nitrate] Other (See Comments)    Headaches    Amoxicillin Nausea And Vomiting    Did it involve swelling of the face/tongue/throat, SOB, or low BP? Yes Did it involve sudden or severe rash/hives, skin peeling, or any reaction on the inside of your mouth or nose? Yes Did you need to seek medical attention at a hospital or doctor's office? Yes When did it last happen?      within last 10 years If all above answers are "NO", may proceed with cephalosporin use.    Dilaudid [Hydromorphone Hcl] Nausea And Vomiting   Ibuprofen Other (See Comments)    Wheezing    Latex Swelling and Other (See Comments)    No reaction with Band-Aids, though   Metoprolol Other (See Comments)    Headaches and wheezing     Social History   Socioeconomic History   Marital status: Single    Spouse name: Not on file   Number of children: 2   Years of education: 12   Highest education level: High school graduate  Occupational History   Not on file  Tobacco Use   Smoking status: Every Day    Packs/day: 0.50    Years: 34.00    Pack years: 17.00    Types: Cigarettes    Smokeless tobacco: Never  Vaping Use   Vaping Use: Never used  Substance and Sexual Activity   Alcohol use: No    Alcohol/week: 0.0 standard drinks    Comment: seldom   Drug use: Yes    Types: Marijuana    Comment: 06/19/19   Sexual activity: Yes    Partners: Male    Birth control/protection: Post-menopausal  Other Topics Concern   Not on file  Social History Narrative   Not on file   Social Determinants of Health   Financial Resource Strain: Medium Risk   Difficulty of Paying Living Expenses: Somewhat hard  Food Insecurity: Food Insecurity Present   Worried About Running Out of Food in the Last Year: Sometimes true   Arboriculturist in  the Last Year: Sometimes true  Transportation Needs: Unmet Transportation Needs   Lack of Transportation (Medical): No   Lack of Transportation (Non-Medical): Yes  Physical Activity: Not on file  Stress: Not on file  Social Connections: Not on file  Intimate Partner Violence: Not on file    Physical Exam Vitals reviewed.  Constitutional:      Appearance: Normal appearance. She is normal weight.  HENT:     Head: Normocephalic.     Nose: Nose normal.     Mouth/Throat:     Mouth: Mucous membranes are moist.     Pharynx: Oropharynx is clear.  Eyes:     Conjunctiva/sclera: Conjunctivae normal.     Pupils: Pupils are equal, round, and reactive to light.  Cardiovascular:     Rate and Rhythm: Normal rate and regular rhythm.     Pulses: Normal pulses.     Heart sounds: Normal heart sounds.  Pulmonary:     Effort: Pulmonary effort is normal.     Breath sounds: Normal breath sounds.  Abdominal:     General: Abdomen is flat.     Palpations: Abdomen is soft.  Musculoskeletal:        General: No swelling. Normal range of motion.     Cervical back: Normal range of motion.     Right lower leg: No edema.     Left lower leg: No edema.  Skin:    General: Skin is warm and dry.     Capillary Refill: Capillary refill takes less than 2  seconds.  Neurological:     General: No focal deficit present.     Mental Status: She is alert. Mental status is at baseline.  Psychiatric:        Mood and Affect: Mood normal.   Arrived for home visit for Tracey Manning who reports doing well. She denied any chest pain, dizziness, shortness of breath. She reports she has been compliant with her medications and appointments.   Today is Tracey Manning's final visit with paramedicine as she has graduated completing goals for [employer]! Her EF has improved from 30% to 50% with care plan and is feeling great with no admissions.   Tracey Manning understands to reach out to HF clinic if she requires any assistance or questions pertaining to her HF symptoms or medications.   Vitals and assessment as noted.   Bubble packs confirmed and are accurate matching Epic med list.   Home visit complete. Discharge complete.      No future appointments.   ACTION: Home visit completed

## 2021-10-21 ENCOUNTER — Other Ambulatory Visit (HOSPITAL_COMMUNITY): Payer: Self-pay | Admitting: Adult Health

## 2021-10-27 ENCOUNTER — Telehealth (HOSPITAL_COMMUNITY): Payer: Self-pay | Admitting: Licensed Clinical Social Worker

## 2021-10-27 NOTE — Telephone Encounter (Signed)
CSW reached out to remind of [facility] this week.  Pt unable to make it due to conflicting appt but continues to want to be on the mailing list.    Jorge Ny, [place] Worker [facility] Desk#: [phone] Cell#: [phone]

## 2021-11-02 ENCOUNTER — Emergency Department (HOSPITAL_COMMUNITY): Payer: Medicaid Other

## 2021-11-02 ENCOUNTER — Encounter (HOSPITAL_COMMUNITY): Payer: Self-pay

## 2021-11-02 ENCOUNTER — Emergency Department (HOSPITAL_COMMUNITY)
Admission: EM | Admit: 2021-11-02 | Discharge: 2021-11-02 | Disposition: A | Discharge status: Home / Self Care | Payer: Medicaid Other | Attending: Emergency Medicine | Admitting: Emergency Medicine

## 2021-11-02 ENCOUNTER — Other Ambulatory Visit: Payer: Self-pay

## 2021-11-02 DIAGNOSIS — R110 Nausea: Secondary | ICD-10-CM | POA: Diagnosis not present

## 2021-11-02 DIAGNOSIS — R202 Paresthesia of skin: Secondary | ICD-10-CM | POA: Diagnosis present

## 2021-11-02 DIAGNOSIS — N183 Chronic kidney disease, stage 3 unspecified: Secondary | ICD-10-CM | POA: Insufficient documentation

## 2021-11-02 DIAGNOSIS — I5043 Acute on chronic combined systolic (congestive) and diastolic (congestive) heart failure: Secondary | ICD-10-CM | POA: Diagnosis not present

## 2021-11-02 DIAGNOSIS — R630 Anorexia: Secondary | ICD-10-CM | POA: Diagnosis not present

## 2021-11-02 DIAGNOSIS — E860 Dehydration: Secondary | ICD-10-CM | POA: Diagnosis not present

## 2021-11-02 DIAGNOSIS — M25511 Pain in right shoulder: Secondary | ICD-10-CM | POA: Insufficient documentation

## 2021-11-02 DIAGNOSIS — J45909 Unspecified asthma, uncomplicated: Secondary | ICD-10-CM | POA: Insufficient documentation

## 2021-11-02 DIAGNOSIS — I2510 Atherosclerotic heart disease of native coronary artery without angina pectoris: Secondary | ICD-10-CM | POA: Insufficient documentation

## 2021-11-02 DIAGNOSIS — Z91040 Latex allergy status: Secondary | ICD-10-CM | POA: Insufficient documentation

## 2021-11-02 DIAGNOSIS — Z79899 Other long term (current) drug therapy: Secondary | ICD-10-CM | POA: Diagnosis not present

## 2021-11-02 DIAGNOSIS — M542 Cervicalgia: Secondary | ICD-10-CM | POA: Insufficient documentation

## 2021-11-02 DIAGNOSIS — I130 Hypertensive heart and chronic kidney disease with heart failure and stage 1 through stage 4 chronic kidney disease, or unspecified chronic kidney disease: Secondary | ICD-10-CM | POA: Insufficient documentation

## 2021-11-02 DIAGNOSIS — Z7982 Long term (current) use of aspirin: Secondary | ICD-10-CM | POA: Insufficient documentation

## 2021-11-02 DIAGNOSIS — Z7951 Long term (current) use of inhaled steroids: Secondary | ICD-10-CM | POA: Insufficient documentation

## 2021-11-02 DIAGNOSIS — Z7902 Long term (current) use of antithrombotics/antiplatelets: Secondary | ICD-10-CM | POA: Insufficient documentation

## 2021-11-02 DIAGNOSIS — F17210 Nicotine dependence, cigarettes, uncomplicated: Secondary | ICD-10-CM | POA: Insufficient documentation

## 2021-11-02 DIAGNOSIS — R001 Bradycardia, unspecified: Secondary | ICD-10-CM | POA: Diagnosis not present

## 2021-11-02 DIAGNOSIS — Z951 Presence of aortocoronary bypass graft: Secondary | ICD-10-CM | POA: Diagnosis not present

## 2021-11-02 DIAGNOSIS — R197 Diarrhea, unspecified: Secondary | ICD-10-CM | POA: Diagnosis not present

## 2021-11-02 DIAGNOSIS — J449 Chronic obstructive pulmonary disease, unspecified: Secondary | ICD-10-CM | POA: Insufficient documentation

## 2021-11-02 LAB — COMPREHENSIVE METABOLIC PANEL
ALT: 35 U/L (ref 0–44)
AST: 23 U/L (ref 15–41)
Albumin: 3.9 g/dL (ref 3.5–5.0)
Alkaline Phosphatase: 77 U/L (ref 38–126)
Anion gap: 7 (ref 5–15)
BUN: 38 mg/dL — ABNORMAL HIGH (ref 6–20)
CO2: 23 mmol/L (ref 22–32)
Calcium: 9.4 mg/dL (ref 8.9–10.3)
Chloride: 108 mmol/L (ref 98–111)
Creatinine, Ser: 1.82 mg/dL — ABNORMAL HIGH (ref 0.44–1.00)
GFR, Estimated: 31 mL/min — ABNORMAL LOW (ref >60)
Glucose, Bld: 97 mg/dL (ref 70–99)
Potassium: 4.6 mmol/L (ref 3.5–5.1)
Sodium: 138 mmol/L (ref 135–145)
Total Bilirubin: 0.5 mg/dL (ref 0.3–1.2)
Total Protein: 6.9 g/dL (ref 6.5–8.1)

## 2021-11-02 LAB — CBC
HCT: 37.8 % (ref 36.0–46.0)
Hemoglobin: 12.5 g/dL (ref 12.0–15.0)
MCH: 27.9 pg (ref 26.0–34.0)
MCHC: 33.1 g/dL (ref 30.0–36.0)
MCV: 84.4 fL (ref 80.0–100.0)
Platelets: 181 10*3/uL (ref 150–400)
RBC: 4.48 MIL/uL (ref 3.87–5.11)
RDW: 16.4 % — ABNORMAL HIGH (ref 11.5–15.5)
WBC: 6.4 10*3/uL (ref 4.0–10.5)
nRBC: 0 % (ref 0.0–0.2)

## 2021-11-02 LAB — URINALYSIS, ROUTINE W REFLEX MICROSCOPIC
Bilirubin Urine: NEGATIVE
Glucose, UA: 50 mg/dL — AB
Hgb urine dipstick: NEGATIVE
Ketones, ur: NEGATIVE mg/dL
Leukocytes,Ua: NEGATIVE
Nitrite: NEGATIVE
Protein, ur: NEGATIVE mg/dL
Specific Gravity, Urine: 1.008 (ref 1.005–1.030)
pH: 5 (ref 5.0–8.0)

## 2021-11-02 LAB — BRAIN NATRIURETIC PEPTIDE: B Natriuretic Peptide: 22.4 pg/mL (ref 0.0–100.0)

## 2021-11-02 LAB — LIPASE, BLOOD: Lipase: 83 U/L — ABNORMAL HIGH (ref 11–51)

## 2021-11-02 LAB — I-STAT BETA HCG BLOOD, ED (MC, WL, AP ONLY): I-stat hCG, quantitative: <5 m[IU]/mL (ref <5)

## 2021-11-02 LAB — TROPONIN I (HIGH SENSITIVITY)
Troponin I (High Sensitivity): 8 ng/L (ref <18)
Troponin I (High Sensitivity): 7 ng/L (ref <18)

## 2021-11-02 LAB — MAGNESIUM: Magnesium: 2.1 mg/dL (ref 1.7–2.4)

## 2021-11-02 MED ORDER — METHOCARBAMOL 500 MG PO TABS: 500.0000 mg | ORAL_TABLET | Freq: Two times a day (BID) | ORAL | 0 refills | Status: DC

## 2021-11-02 MED ORDER — ASPIRIN 81 MG PO CHEW: 324.0000 mg | CHEWABLE_TABLET | Freq: Once | ORAL | Status: DC

## 2021-11-02 MED ORDER — LACTATED RINGERS IV BOLUS
500.0000 mL | Freq: Once | INTRAVENOUS | Status: AC
  Administered 2021-11-02: 500 mL via INTRAVENOUS

## 2021-11-02 MED ORDER — METHOCARBAMOL 500 MG PO TABS
1000.0000 mg | ORAL_TABLET | Freq: Once | ORAL | Status: AC
  Administered 2021-11-02: 1000 mg via ORAL
  Filled 2021-11-02: qty 2

## 2021-11-02 MED ORDER — METHOCARBAMOL 500 MG PO TABS
500.0000 mg | ORAL_TABLET | Freq: Once | ORAL | Status: AC
  Administered 2021-11-02: 500 mg via ORAL
  Filled 2021-11-02: qty 1

## 2021-11-02 MED ORDER — ACETAMINOPHEN 325 MG PO TABS
650.0000 mg | ORAL_TABLET | Freq: Once | ORAL | Status: AC
  Administered 2021-11-02: 650 mg via ORAL
  Filled 2021-11-02: qty 2

## 2021-11-02 NOTE — Discharge Instructions (Addendum)
Your cardiologist is aware that you were here in the ED.  His colleague recommended that you stop taking the bisoprolol.  Additionally, you can start taking Lasix as needed.  You should be seen in the clinic as soon as possible.  Call the number below tomorrow morning to set up an appointment. In regards to your muscle tightness, there is a prescription for muscle relaxer that was sent to your pharmacy.  This medication can cause drowsiness and put you at risk for falls.  Take with caution.  Continue to treat pain with Tylenol as well.   If you have any worsening of your symptoms, please return to the emergency department.

## 2021-11-02 NOTE — ED Provider Notes (Signed)
[facility] EMERGENCY DEPARTMENT Provider Note   CSN: [id number] Arrival date & time: 11/02/21  1452     History Chief Complaint  Patient presents with   Numbness    Tracey Manning is a 60 y.o. female.  HPI Patient presents for pain and paresthesias to right arm and leg.  She describes a fall of her bed 5 days ago, during which she landed on her right side.  The floor that she fell on was composed of cement.  She states that she struck her right knee but was able to get up and has not had any pain in her right knee since the fall.  She also had onset of diarrhea on Thursday.  The following day, she experienced pain in the area of the right muscles of her neck, right shoulder, and anterior aspect of right arm.  She additionally describes paresthesias in her fingers and toes on the right side.  She continued to have diarrhea up until today.  She is also had 2 days of nausea without vomiting.  Because of her nausea, she has had very little p.o. intake.  She has been able to take her medications, including this morning's medications.  Currently, she endorses continued pain in right neck, shoulder, and anterior right arm.  She continues to have distal paresthesias.  She denies any current shortness of breath or chest discomfort.  Her medical history is notable for presumed ischemic cardiomyopathy with decreased LVEF, currently followed by Dr. Haroldine Laws, HLD, asthma, anxiety, GERD, HTN, and OSA.  Last cardiac cath was 2 years ago.  Stent was placed at that time.    Past Medical History:  Diagnosis Date   Anxiety    Arthritis    Asthma    Barrett's esophagus    Chest pain    Collagen vascular disease ([place])    Colon polyps    Coronary artery disease    Depression    Dyslipidemia    GERD (gastroesophageal reflux disease)    occ   Hyperlipidemia    Hypertension    Myocardial infarct ([place]) 7829,5621   Sciatica    Sleep apnea    no CPAP ordered but using oxygen at  bedtime   Tobacco abuse     Patient Active Problem List   Diagnosis Date Noted   Acute on chronic combined systolic (congestive) and diastolic (congestive) heart failure ([place]) 03/10/2021   Acute respiratory failure with hypoxia ([place]) 02/23/2021   Impingement syndrome of left shoulder 03/12/2020   Chronic pain 07/18/2019   CRI (chronic renal insufficiency), stage 3 (moderate) 07/18/2019   COPD (chronic obstructive pulmonary disease) ([place]) 06/28/2019   Obesity (BMI 30-39.9) 06/28/2019   Ischemic cardiomyopathy 06/28/2019   Dyspnea on exertion 30/86/5784   Acute systolic CHF (congestive heart failure) (H[facility])    AKI (acute kidney injury) ([place])    Abnormal uterine bleeding (AUB) 04/03/2016   Dysfunctional uterine bleeding 11/14/2015   Bacterial vaginosis (recurrent) 11/14/2015   Atypical chest pain 11/14/2015   Essential hypertension 04/04/2014   Dyslipidemia, goal LDL below 70 04/04/2014   CAD S/P multiple PCIs 04/04/2014    Past Surgical History:  Procedure Laterality Date   CARDIAC CATHETERIZATION  10/09/2003   [place], [place]) - LAD with 30% prox narrowing, 50% stenosis in mid-portion of PLA; RCA with 40% narrowing proximally ([name], [name])   CARDIAC CATHETERIZATION  10/10/2007   70% stenosis in first septal perforator branch of LAD, 60-70% narrowing in mid LAD, 20%  narrowing in mid AV groove Cfx with 80% diffuse narrowing in small distal marginal, total occlusion of mid RCA with L to R collaterals, 90% stenosis diffusely in prox branch of RCA followed by 70% stenosis in secondary curve & 80% stenosis in small marginal branch ([name])   CARDIAC CATHETERIZATION  05/28/2008   normal L main, RCA with 100% prox lesion w/distal filling from LAD collaterals, LAD with 20% prox tubular lesion/ 40% mid LAD lesion/previous stent patent ([name])   CARDIAC CATHETERIZATION  09/15/2009   discrete 100% osital RCA lesion, 50% prox LAD lesion, non-obstructive  disease in all coronaries ([name])   [place] W/ POLYPECTOMY     CORONARY ANGIOPLASTY WITH STENT PLACEMENT  10/31/2007   PCI of distal Cfx with 2.25x59mm Taxus Adam DES, 60% narrowing of mid LAD ([name])   CORONARY ANGIOPLASTY WITH STENT PLACEMENT  06/11/2011   PCI of prox-mid LAD with 3x83mm DES Resolute ([name])   CORONARY STENT INTERVENTION N/A 06/19/2019   Procedure: CORONARY STENT INTERVENTION;  Surgeon: [name], MD;  Location: [facility];  Service: Cardiovascular;  Laterality: N/A;  LAD   DILATION AND CURETTAGE, DIAGNOSTIC / THERAPEUTIC     DILITATION & CURRETTAGE/HYSTROSCOPY WITH HYDROTHERMAL ABLATION N/A 04/03/2016   Procedure: DILATATION & CURETTAGE/HYSTEROSCOPY WITH HYDROTHERMAL ABLATION;  Surgeon: [name], MD;  Location: [facility];  Service: Gynecology;  Laterality: N/A;   NM MYOCAR PERF WALL MOTION  08/2009   persantine myoview - normal perfusion in all regions, perfusion defect in anterior region (breast attenuation), EF 52%, low risk scan   RIGHT/LEFT HEART CATH AND CORONARY ANGIOGRAPHY N/A 06/15/2019   Procedure: RIGHT/LEFT HEART CATH AND CORONARY ANGIOGRAPHY;  Surgeon: Belva Crome, MD;  Location: [facility];  Service: Cardiovascular;  Laterality: N/A;     OB History     Gravida  8   Para      Term      Preterm      AB  6   Living  2      SAB  6   IAB      Ectopic      Multiple      Live Births  2           Family History  Problem Relation Age of Onset   Leukemia Mother    Clotting disorder Father        blood clot   Hypertension Sister    Diabetes Sister    Stroke Sister 81   Bleeding Disorder Son        ITP "free bleeding disorder"   Colon cancer Paternal Grandmother    Diabetes Paternal Grandmother    Stomach cancer Neg Hx    Pancreatic cancer Neg Hx     Social History   Tobacco Use   Smoking status: Every Day    Packs/day: 0.50    Years:  34.00    Pack years: 17.00    Types: Cigarettes   Smokeless tobacco: Never  Vaping Use   Vaping Use: Never used  Substance Use Topics   Alcohol use: No    Alcohol/week: 0.0 standard drinks    Comment: seldom   Drug use: Yes    Types: Marijuana    Comment: 06/19/19    Home Medications Prior to Admission medications   Medication Sig Start Date End Date Taking? Authorizing Provider  acetaminophen (TYLENOL) 500 MG tablet Take 1,500 mg by mouth daily as needed (pain).   Yes [provider]  albuterol (PROVENTIL HFA) 108 (90 Base) MCG/ACT inhaler Inhale 2 puffs by mouth every 4 hours as needed Patient taking differently: Inhale 2 puffs into the lungs every 4 (four) hours as needed for wheezing or shortness of breath. 05/02/21  Yes   albuterol (PROVENTIL) (2.5 MG/3ML) 0.083% nebulizer solution INHALE 1 VIAL VIA NEBULIZER EVERY 6 HOURS AS NEEDED Patient taking differently: Take 2.5 mg by nebulization every 6 (six) hours as needed for wheezing or shortness of breath. 02/21/21 02/21/22 Yes Nolene Ebbs, MD  aspirin 81 MG EC tablet Take 1 tablet (81 mg total) by mouth daily. Patient taking differently: Take 81 mg by mouth every morning. 03/12/21  Yes Kroeger, Daleen Snook M., PA-C  atorvastatin (LIPITOR) 80 MG tablet Take 1 tablet (80 mg total) by mouth daily. Patient taking differently: Take 80 mg by mouth at bedtime. 05/02/21  Yes   bisoprolol (ZEBETA) 5 MG tablet TAKE 1 TABLET (5 MG TOTAL) BY MOUTH DAILY (AM) Patient taking differently: Take 5 mg by mouth every morning. 10/21/21  Yes Bensimhon, Shaune Pascal, MD  cetirizine (ZYRTEC) 10 MG tablet TAKE 1 TABLET BY MOUTH ONCE A DAY AS NEEDED Patient taking differently: Take 10 mg by mouth daily as needed for allergies (itching). 02/21/21 02/21/22 Yes Nolene Ebbs, MD  clopidogrel (PLAVIX) 75 MG tablet Take 1 tablet (75 mg total) by mouth daily. Patient taking differently: Take 75 mg by mouth every morning. 04/24/21  Yes Clegg, [name] D, NP  dapagliflozin  propanediol (FARXIGA) 10 MG TABS tablet TAKE 1 TABLET BY MOUTH DAILY BEFORE BREAKFAST Patient taking differently: Take 10 mg by mouth every morning. 04/24/21 04/24/22 Yes Clegg, [name] D, NP  Evolocumab 140 MG/ML SOAJ INJECT 1 DOSE INTO THE SKIN EVERY 14 (FOURTEEN) DAYS. Patient taking differently: Inject 140 mg into the skin See admin instructions. Repatha - inject 140 mg subcutaneously every other Wednesday 05/19/21 05/19/22 Yes Hilty, Nadean Corwin, MD  Fluticasone-Umeclidin-Vilant (TRELEGY ELLIPTA) 200-62.5-25 MCG/INH AEPB Inhale 1 puff into the lungs daily as needed (wheezing/sob).   Yes [provider]  furosemide (LASIX) 40 MG tablet TAKE 1 TABLET BY MOUTH DAILY. Patient taking differently: Take 40 mg by mouth every morning. 04/24/21  Yes Clegg, [name] D, NP  hydrOXYzine (ATARAX/VISTARIL) 25 MG tablet Take 0.5-1 tablets (12.5-25 mg total) by mouth every 8 (eight) hours as needed for itching. Patient taking differently: Take 25 mg by mouth every 8 (eight) hours as needed for itching. 05/27/21  Yes Jaynee Eagles, PA-C  methocarbamol (ROBAXIN) 500 MG tablet Take 1 tablet (500 mg total) by mouth 2 (two) times daily. 11/02/21  Yes Godfrey Pick, MD  nitroGLYCERIN (NITROSTAT) 0.4 MG SL tablet DISSOLVE 1 TABLET UNDER THE TONGUE AS NEEDED AS DIRECTED Patient taking differently: 0.4 mg every 5 (five) minutes as needed for chest pain. 03/12/21 03/12/22 Yes Kroeger, Lorelee Cover., PA-C  potassium chloride (KLOR-CON) 10 MEQ tablet Take 2 tablets (20 mEq total) by mouth 2 (two) times daily. 04/24/21 04/24/22 Yes Clegg, [name] D, NP  pregabalin (LYRICA) 75 MG capsule Take 1 capsule (75 mg total) by mouth 2 (two) times daily as needed (nerve pain). Patient taking differently: Take 75 mg by mouth 2 (two) times daily. 03/20/21  Yes Gregor Hams, MD  sacubitril-valsartan (ENTRESTO) 97-103 MG Take 1 tablet by mouth 2 (two) times daily. 04/24/21  Yes Clegg, [name] D, NP  spironolactone (ALDACTONE) 25 MG tablet TAKE  1 TABLET BY MOUTH DAILY  (AM) Patient taking differently: Take 25 mg by mouth every morning. 08/26/21  Yes Clegg, [name] D, NP  triamcinolone cream (KENALOG) 0.1 % Apply 1 application topically 2 (two) times daily. Patient taking differently: Apply 1 application topically 2 (two) times daily as needed (itching). 05/27/21  Yes Jaynee Eagles, PA-C  Vitamin D, Ergocalciferol, (DRISDOL) 1.25 MG (50000 UNIT) CAPS capsule TAKE 1 CAPSULE BY MOUTH ONCE A WEEK Patient taking differently: Take 50,000 Units by mouth every Sunday. 02/21/21 02/21/22 Yes Nolene Ebbs, MD  zolpidem (AMBIEN) 10 MG tablet Take 1 tablet (10 mg total) by mouth at bedtime as needed for sleep Patient taking differently: Take 5 mg by mouth at bedtime. 03/20/21  Yes   clotrimazole-betamethasone (LOTRISONE) cream Apply 2 times a day as needed Patient not taking: Reported on 11/02/2021 04/07/21     potassium chloride (KLOR-CON) 10 MEQ tablet TAKE 2 TABLETS BY MOUTH 2 TIMES DAILY Patient taking differently: Take 20 mEq by mouth 2 (two) times daily. 03/10/21 03/12/21  [name] D, NP    Allergies    Coreg [carvedilol], Strawberry extract, Imdur [isosorbide nitrate], Amoxicillin, Dilaudid [hydromorphone hcl], Ibuprofen, Latex, Metoprolol, and Coconut flavor  Review of Systems   Review of Systems  Constitutional:  Positive for activity change and appetite change. Negative for chills, diaphoresis and fever.  HENT:  Negative for congestion, ear pain and sore throat.   Eyes:  Negative for pain and visual disturbance.  Respiratory:  Negative for cough, chest tightness, shortness of breath and wheezing.   Cardiovascular:  Negative for chest pain, palpitations and leg swelling.  Gastrointestinal:  Positive for diarrhea and nausea. Negative for abdominal pain and vomiting.  Genitourinary:  Negative for dysuria, flank pain, hematuria, pelvic pain and urgency.  Musculoskeletal:  Positive for arthralgias, myalgias and neck pain. Negative for back pain, gait problem and joint  swelling.  Skin:  Negative for color change and rash.  Neurological:  Negative for dizziness, seizures, syncope, facial asymmetry, speech difficulty, weakness, numbness and headaches.       Distal right-sided paresthesias  Hematological:  Does not bruise/bleed easily.  Psychiatric/Behavioral:  Negative for confusion and decreased concentration.   All other systems reviewed and are negative.  Physical Exam Updated Vital Signs BP 129/75 (BP Location: Right Arm)   Pulse (!) 53   Temp 97.6 F (36.4 C)   Resp 19   Ht 5' (1.524 m)   Wt 97.5 kg   LMP  (LMP Unknown)   SpO2 100%   BMI 41.99 kg/m   Physical Exam Vitals and nursing note reviewed.  Constitutional:      General: She is not in acute distress.    Appearance: Normal appearance. She is well-developed. She is not ill-appearing, toxic-appearing or diaphoretic.  HENT:     Head: Normocephalic and atraumatic.     Right Ear: External ear normal.     Left Ear: External ear normal.     Nose: Nose normal. No congestion or rhinorrhea.     Mouth/Throat:     Mouth: Mucous membranes are moist.     Pharynx: Oropharynx is clear.  Eyes:     General: No scleral icterus.    Extraocular Movements: Extraocular movements intact.     Conjunctiva/sclera: Conjunctivae normal.  Neck:     Comments: Pain and tenderness of right SCM area.  Pain worsened with ipsilateral neck rotation. Cardiovascular:     Rate and Rhythm: Regular rhythm. Bradycardia present.     Heart  sounds: No murmur heard. Pulmonary:     Effort: Pulmonary effort is normal. No respiratory distress.     Breath sounds: Normal breath sounds. No wheezing or rales.  Chest:     Chest wall: No tenderness.  Abdominal:     Palpations: Abdomen is soft.     Tenderness: There is no abdominal tenderness.  Musculoskeletal:        General: Tenderness (Tenderness of right side of neck, right shoulder, right bicep, and right anterior forearm) present. No swelling.     Cervical back: Neck  supple. Tenderness present. No rigidity.     Right lower leg: No edema.     Left lower leg: No edema.  Skin:    General: Skin is warm and dry.     Capillary Refill: Capillary refill takes less than 2 seconds.     Coloration: Skin is not jaundiced or pale.  Neurological:     General: No focal deficit present.     Mental Status: She is alert and oriented to person, place, and time.     Cranial Nerves: Cranial nerves 2-12 are intact. No cranial nerve deficit, dysarthria or facial asymmetry.     Sensory: Sensation is intact. No sensory deficit.     Motor: No weakness, tremor, abnormal muscle tone or pronator drift.     Coordination: Coordination is intact. Coordination normal. Finger-Nose-Finger Test normal.  Psychiatric:        Mood and Affect: Mood normal.        Behavior: Behavior normal.        Thought Content: Thought content normal.        Judgment: Judgment normal.    ED Results / Procedures / Treatments   Labs (all labs ordered are listed, but only abnormal results are displayed) Labs Reviewed  CBC - Abnormal; Notable for the following components:      Result Value   RDW 16.4 (*)    All other components within normal limits  COMPREHENSIVE METABOLIC PANEL - Abnormal; Notable for the following components:   BUN 38 (*)    Creatinine, Ser 1.82 (*)    GFR, Estimated 31 (*)    All other components within normal limits  LIPASE, BLOOD - Abnormal; Notable for the following components:   Lipase 83 (*)    All other components within normal limits  URINALYSIS, ROUTINE W REFLEX MICROSCOPIC - Abnormal; Notable for the following components:   Color, Urine STRAW (*)    APPearance HAZY (*)    Glucose, UA 50 (*)    All other components within normal limits  MAGNESIUM  BRAIN NATRIURETIC PEPTIDE  I-STAT BETA HCG BLOOD, ED ([facility], [facility], [facility] ONLY)  TROPONIN I (HIGH SENSITIVITY)  TROPONIN I (HIGH SENSITIVITY)    EKG EKG Interpretation  Date/Time:  Sunday November 02 2021 15:08:30  EST Ventricular Rate:  45 PR Interval:  178 QRS Duration: 82 QT Interval:  698 QTC Calculation: 604 R Axis:   48 Text Interpretation: Sinus bradycardia Low voltage, extremity and precordial leads Prolonged QT interval Confirmed by Godfrey Pick ([id number]) on 11/02/2021 5:09:52 PM  Radiology CT Head Wo Contrast  Result Date: 11/02/2021 CLINICAL DATA:  [place] out of bed 3 days ago, right hand and foot numbness EXAM: CT HEAD WITHOUT CONTRAST TECHNIQUE: Contiguous axial images were obtained from the base of the skull through the vertex without intravenous contrast. COMPARISON:  12/01/2014 FINDINGS: Brain: No acute infarct or hemorrhage. Scattered hypodensities in the periventricular white matter consistent with chronic small vessel  ischemic change. Lateral ventricles and remaining midline structures are unremarkable. No acute extra-axial fluid collections. No mass effect. Vascular: No hyperdense vessel or unexpected calcification. Skull: Normal. Negative for fracture or focal lesion. Sinuses/Orbits: No acute finding. Other: None. IMPRESSION: 1. No acute intracranial process. Electronically Signed   By: Randa Ngo M.D.   On: 11/02/2021 16:30   CT Cervical Spine Wo Contrast  Result Date: 11/02/2021 CLINICAL DATA:  [place] out of bed 3 days ago, right hand and foot numbness EXAM: CT CERVICAL SPINE WITHOUT CONTRAST TECHNIQUE: Multidetector CT imaging of the cervical spine was performed without intravenous contrast. Multiplanar CT image reconstructions were also generated. COMPARISON:  01/16/2019 FINDINGS: Alignment: Stable reversal of cervical lordosis likely due to multilevel spondylosis. Otherwise alignment is anatomic. Skull base and vertebrae: No acute fracture. No primary bone lesion or focal pathologic process. Soft tissues and spinal canal: No prevertebral fluid or swelling. No visible canal hematoma. Disc levels: Multilevel spondylosis most pronounced at C4-5, C5-6, and C6-7. Cervical spondylosis greatest  at C2-3. No significant change since prior exam. Upper chest: Airway is patent.  Lung apices are clear. Other: Reconstructed images demonstrate no additional findings. IMPRESSION: 1. Stable multilevel cervical spondylosis and facet hypertrophy. 2. No acute fracture. Electronically Signed   By: Randa Ngo M.D.   On: 11/02/2021 16:32   DG Chest Portable 1 View  Result Date: 11/02/2021 CLINICAL DATA:  CHF EXAM: PORTABLE CHEST 1 VIEW COMPARISON:  03/10/2021 FINDINGS: Heart and mediastinal contours are within normal limits. No focal opacities or effusions. No acute bony abnormality. IMPRESSION: No active disease. Electronically Signed   By: Rolm Baptise M.D.   On: 11/02/2021 15:57    Procedures Procedures   Medications Ordered in ED Medications  lactated ringers bolus 500 mL (0 mLs Intravenous Stopped 11/02/21 1707)  lactated ringers bolus 500 mL (0 mLs Intravenous Stopped 11/02/21 1805)  methocarbamol (ROBAXIN) tablet 1,000 mg (1,000 mg Oral Given 11/02/21 1759)  acetaminophen (TYLENOL) tablet 650 mg (650 mg Oral Given 11/02/21 1758)  methocarbamol (ROBAXIN) tablet 500 mg (500 mg Oral Given 11/02/21 2132)    ED Course  I have reviewed the triage vital signs and the nursing notes.  Pertinent labs & imaging results that were available during my care of the patient were reviewed by me and considered in my medical decision making (see chart for details).    MDM Rules/Calculators/A&P                          Patient is a 60 year old female with history of CAD, CHF, HLD, HTN, OSA, anxiety, and GERD, presenting for 3 days of diarrhea and 2 days of nausea, decreased p.o. intake, and right sided pain and paresthesias.  She describes a fall 3 days ago.  On exam, she does not have any external evidence of any injury.  Patient's RUE pain is more consistent with radiculopathy.  On arrival in the ED, her vital signs are notable for bradycardia.  EKG was obtained which shows a sinus rhythm.  Heart rate  is as low as 45.  Patient denies any history of bradycardia.  She did take her morning medications but, per chart review, these include 5mg  bisoprolol but no other AV nodal blocking agents.  Per chart review, during her last visit with cardiologist, patient also had a low heart rate.  She was advised to decrease her dose of bisoprolol to 2.5 mg.  Patient is not sure what dose she is taking  at the moment.  Given her recent decreased p.o. intake, diarrhea, in addition to daily use of Lasix, will obtain labs with special attention to electrolytes.  Patient's right arm pain is in distribution of C5 radiculopathy.  Will check troponins as well to rule out referred pain from cardiac etiology.  IV fluids were ordered for dehydration.  Patient was given Tylenol and Robaxin for symptomatic relief of her areas of pain.  On reassessment, patient did have improvement in her pain.  She continues to endorse residual pain in the area of her right hamstring.  She was able to stand and ambulate across the hallway to the bathroom several times.  She states that her pain is not worsened with weightbearing.  Typically it will be worse when her legs are elevated and improve when she hangs her leg off the bed.  This may be consistent with undiagnosed PAD.  Patient's distal lower extremities are well perfused with pulses intact.  Laboratory work-up showed normal electrolytes.  Patient does have a slight increase in her creatinine (1.82 from baseline of 1.3).  Patient's BNP was not elevated and chest x-ray did not show any evidence of pulmonary edema.  Additional IV fluids were ordered.  CT scan of head and cervical spine were notable for multilevel spondylosis and facet hypertrophy, consistent with C-spine radiculopathy.  Patient continued to have sinus bradycardia while in the ED.  When standing and ambulating, she did not have any near syncopal symptoms.  I did discuss this bradycardia with cardiologist on-call.  He stated that he would  inform Dr. Haroldine Laws office that patient was in the ED.  Patient will benefit from close follow-up.  In the meantime, he advised that patient should discontinue her bisoprolol.  She should also take her Lasix only as needed.  These recommendations were relayed to the patient.  Patient had return of appetite was able to eat in the ED.  Patient does prefer to go home.  Her previous symptoms of generalized weakness were likely related to dehydration.  Currently, she does not seem symptomatic from her bradycardia.  Additionally, this bradycardia has likely been present since August, where it was seen at her cardiology office appointment.  Patient was encouraged to return to the ED if she does have any worsening of symptoms.  Prescription for Robaxin was given, to be taken as needed.  Patient was advised to take with caution.  She was discharged in stable condition.  Final Clinical Impression(s) / ED Diagnoses Final diagnoses:  Dehydration    Rx / DC Orders ED Discharge Orders          Ordered    methocarbamol (ROBAXIN) 500 MG tablet  2 times daily        11/02/21 2109             Godfrey Pick, MD 11/03/21 [phone]

## 2021-11-02 NOTE — ED Triage Notes (Signed)
Pt states numbness, tingling and pain to R neck, arm and leg.  Pt did fall out of her bed 5 days ago and landed on her R side.  Denies incontinence.  C/o some nausea.  128/74 Hr 50 Rr 16 100% RA

## 2021-11-04 ENCOUNTER — Ambulatory Visit: Payer: Medicaid Other | Admitting: Cardiovascular Disease

## 2021-11-26 ENCOUNTER — Other Ambulatory Visit: Payer: Self-pay | Admitting: Internal Medicine

## 2021-12-11 ENCOUNTER — Other Ambulatory Visit: Payer: Self-pay | Admitting: Internal Medicine

## 2021-12-11 LAB — EXTRA LAV TOP TUBE

## 2021-12-11 LAB — BASIC METABOLIC PANEL WITH GFR
BUN/Creatinine Ratio: 21 (calc) (ref 6–22)
BUN: 46 mg/dL — ABNORMAL HIGH (ref 7–25)
CO2: 23 mmol/L (ref 20–32)
Calcium: 9.4 mg/dL (ref 8.6–10.4)
Chloride: 103 mmol/L (ref 98–110)
Creat: 2.15 mg/dL — ABNORMAL HIGH (ref 0.50–1.05)
Glucose, Bld: 99 mg/dL (ref 65–99)
Potassium: 4.4 mmol/L (ref 3.5–5.3)
Sodium: 136 mmol/L (ref 135–146)
eGFR: 26 mL/min/{1.73_m2} — ABNORMAL LOW (ref >=60)

## 2021-12-16 ENCOUNTER — Other Ambulatory Visit: Payer: Self-pay | Admitting: Internal Medicine

## 2021-12-17 LAB — BASIC METABOLIC PANEL WITH GFR
BUN/Creatinine Ratio: 17 (calc) (ref 6–22)
BUN: 27 mg/dL — ABNORMAL HIGH (ref 7–25)
CO2: 25 mmol/L (ref 20–32)
Calcium: 9.9 mg/dL (ref 8.6–10.4)
Chloride: 109 mmol/L (ref 98–110)
Creat: 1.6 mg/dL — ABNORMAL HIGH (ref 0.50–1.05)
Glucose, Bld: 100 mg/dL — ABNORMAL HIGH (ref 65–99)
Potassium: 4.9 mmol/L (ref 3.5–5.3)
Sodium: 141 mmol/L (ref 135–146)
eGFR: 37 mL/min/{1.73_m2} — ABNORMAL LOW (ref >=60)

## 2021-12-17 LAB — EXTRA LAV TOP TUBE

## 2021-12-17 LAB — MAGNESIUM: Magnesium: 2.3 mg/dL (ref 1.5–2.5)

## 2021-12-22 ENCOUNTER — Telehealth (HOSPITAL_COMMUNITY): Payer: Self-pay | Admitting: Licensed Clinical Social Worker

## 2021-12-22 NOTE — Telephone Encounter (Signed)
CSW called pt to remind of [facility] group this Wednesday- pt reports she is sick at this time and has a PCP appt on Wednesday so will be unable to make it  Would like to continue being on the list to be informed of future group  meetings  Will continue to follow and assist as needed  Jorge Ny, [facility] Desk#: [phone] Cell#: [phone]

## 2021-12-25 ENCOUNTER — Other Ambulatory Visit (HOSPITAL_COMMUNITY): Payer: Self-pay | Admitting: Adult Health

## 2021-12-25 ENCOUNTER — Other Ambulatory Visit: Payer: Self-pay | Admitting: Internal Medicine

## 2021-12-26 LAB — BASIC METABOLIC PANEL WITH GFR
BUN/Creatinine Ratio: 11 (calc) (ref 6–22)
BUN: 14 mg/dL (ref 7–25)
CO2: 25 mmol/L (ref 20–32)
Calcium: 10 mg/dL (ref 8.6–10.4)
Chloride: 107 mmol/L (ref 98–110)
Creat: 1.27 mg/dL — ABNORMAL HIGH (ref 0.50–1.05)
Glucose, Bld: 86 mg/dL (ref 65–99)
Potassium: 5.1 mmol/L (ref 3.5–5.3)
Sodium: 139 mmol/L (ref 135–146)
eGFR: 48 mL/min/{1.73_m2} — ABNORMAL LOW (ref >=60)

## 2021-12-26 LAB — EXTRA LAV TOP TUBE

## 2022-02-20 ENCOUNTER — Other Ambulatory Visit (HOSPITAL_COMMUNITY): Payer: Self-pay | Admitting: Adult Health

## 2022-02-25 ENCOUNTER — Telehealth: Payer: Self-pay | Admitting: Licensed Clinical Social Worker

## 2022-02-25 NOTE — Telephone Encounter (Signed)
Reminder regarding Medicaid re-certification process was mailed to pt, encouraging her to ensure that an up to date phone number, address and email is registered with DSS so that she receives re-certification paperwork.  ?  ?Westley Hummer, MSW, LCSW ?Clinical Social Worker II ?[facility]  ?[phone]- work cell phone (preferred) ?[phone]- desk phone ?

## 2022-03-26 ENCOUNTER — Other Ambulatory Visit: Payer: Self-pay | Admitting: Internal Medicine

## 2022-03-28 LAB — SEDIMENTATION RATE: Sed Rate: 14 mm/h (ref 0–30)

## 2022-03-28 LAB — ANTI-NUCLEAR AB-TITER (ANA TITER)
ANA TITER: 1:40 {titer} — ABNORMAL HIGH
ANA Titer 1: 1:40 {titer} — ABNORMAL HIGH

## 2022-03-28 LAB — URIC ACID: Uric Acid, Serum: 7.6 mg/dL — ABNORMAL HIGH (ref 2.5–7.0)

## 2022-03-28 LAB — ANA: Anti Nuclear Antibody (ANA): POSITIVE — AB

## 2022-03-28 LAB — RHEUMATOID FACTOR: Rheumatoid fact SerPl-aCnc: <14 IU/mL (ref <14)

## 2022-04-07 ENCOUNTER — Encounter: Payer: Self-pay | Admitting: Gastroenterology

## 2022-04-09 ENCOUNTER — Encounter: Payer: Self-pay | Admitting: Cardiovascular Disease

## 2022-04-09 NOTE — Telephone Encounter (Signed)
Error

## 2022-04-10 ENCOUNTER — Encounter (HOSPITAL_COMMUNITY): Payer: Self-pay | Admitting: Internal Medicine

## 2022-04-10 ENCOUNTER — Ambulatory Visit (HOSPITAL_COMMUNITY)
Admission: RE | Admit: 2022-04-10 | Discharge: 2022-04-10 | Disposition: A | Discharge status: Home / Self Care | Payer: Medicaid Other | Source: Ambulatory Visit | Attending: Internal Medicine | Admitting: Internal Medicine

## 2022-04-10 VITALS — BP 102/68 | HR 69 | Wt 222.4 lb

## 2022-04-10 DIAGNOSIS — I493 Ventricular premature depolarization: Secondary | ICD-10-CM | POA: Insufficient documentation

## 2022-04-10 DIAGNOSIS — I5022 Chronic systolic (congestive) heart failure: Secondary | ICD-10-CM | POA: Diagnosis not present

## 2022-04-10 DIAGNOSIS — I2510 Atherosclerotic heart disease of native coronary artery without angina pectoris: Secondary | ICD-10-CM | POA: Diagnosis not present

## 2022-04-10 DIAGNOSIS — I110 Hypertensive heart disease with heart failure: Secondary | ICD-10-CM | POA: Diagnosis present

## 2022-04-10 DIAGNOSIS — Z720 Tobacco use: Secondary | ICD-10-CM | POA: Diagnosis not present

## 2022-04-10 DIAGNOSIS — Z6841 Body Mass Index (BMI) 40.0 and over, adult: Secondary | ICD-10-CM | POA: Insufficient documentation

## 2022-04-10 DIAGNOSIS — F17200 Nicotine dependence, unspecified, uncomplicated: Secondary | ICD-10-CM | POA: Insufficient documentation

## 2022-04-10 DIAGNOSIS — I10 Essential (primary) hypertension: Secondary | ICD-10-CM | POA: Diagnosis not present

## 2022-04-10 DIAGNOSIS — E119 Type 2 diabetes mellitus without complications: Secondary | ICD-10-CM | POA: Insufficient documentation

## 2022-04-10 DIAGNOSIS — J449 Chronic obstructive pulmonary disease, unspecified: Secondary | ICD-10-CM | POA: Insufficient documentation

## 2022-04-10 DIAGNOSIS — I25110 Atherosclerotic heart disease of native coronary artery with unstable angina pectoris: Secondary | ICD-10-CM | POA: Diagnosis not present

## 2022-04-10 DIAGNOSIS — Z79899 Other long term (current) drug therapy: Secondary | ICD-10-CM | POA: Insufficient documentation

## 2022-04-10 DIAGNOSIS — N179 Acute kidney failure, unspecified: Secondary | ICD-10-CM | POA: Diagnosis not present

## 2022-04-10 DIAGNOSIS — Z01810 Encounter for preprocedural cardiovascular examination: Secondary | ICD-10-CM | POA: Diagnosis not present

## 2022-04-10 DIAGNOSIS — G4733 Obstructive sleep apnea (adult) (pediatric): Secondary | ICD-10-CM | POA: Diagnosis not present

## 2022-04-10 NOTE — Addendum Note (Signed)
Encounter addended by: Scarlette Calico, RN on: 04/10/2022 10:58 AM  Actions taken: Clinical Note Signed, Order list changed, Diagnosis association updated

## 2022-04-10 NOTE — Patient Instructions (Addendum)
Your physician has requested that you have an echocardiogram. Echocardiography is a painless test that uses sound waves to create images of your heart. It provides your doctor with information about the size and shape of your heart and how well your heart's chambers and valves are working. This procedure takes approximately one hour. There are no restrictions for this procedure.  Congratulations!!! You have graduated the [facility], please follow-up with [facility]  If you have any questions or concerns before your next appointment please send [place] a message through [place] or call our office at [phone].    TO LEAVE A MESSAGE FOR THE NURSE SELECT OPTION 2, PLEASE LEAVE A MESSAGE INCLUDING: YOUR NAME DATE OF BIRTH CALL BACK NUMBER REASON FOR CALL**this is important as we prioritize the call backs  YOU WILL RECEIVE A CALL BACK THE SAME DAY AS LONG AS YOU CALL BEFORE 4:00 PM

## 2022-04-10 NOTE — Progress Notes (Signed)
ADVANCED HF CLINIC PROGRESS NOTE  Primary Care: Dr. Tacy Dura  Primary Cardiologist: Dr. Gwenlyn Found HF Cardiologist: Dr. 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   Had remote PCI in 2004 in [place].  In 2008 she had a circumflex PCI by Dr. Claiborne Billings.  She had known occluded RCA with left-to-right collaterals.  In 2012 she had an LAD stent placedby Dr. Claiborne Billings.   She was admitted 7/20 with respiratory failure and unstable angina.  She had new LV dysfunction with an EF of 25%.  She had acute kidney injury as well with a creatinine of 1.8. Cath 05/2019 with 60 to 70% mid circumflex stenosis, known occluded RCA with left-to-right collaterals, and a 95% proximal LAD before the previously placed stent which was 50 to 60% narrowed.  After review the patient was turned down for CABG and she underwent intervention to her LAD   F/u echo 10/20 EF 25-30%.  Admitted 02/23/21 with increased shortness of breath. Diuresed with IV lasix and discharged the next day. -Echo 02/2021 EF 30-35%   Echo 11/22 EF 50%  Today she returns for HF follow up. Has graduated from [employer]. Says she is compliant with meds. Unable to tolerate [place] due to "dry kidneys" - PCP stopped lasix and Wilder Glade and feels much better. Still smoking 6 cis/day. Denies CP, sob or edema. Needs pre-op for dental surgery   Zio 12/21:  1. Sinus rhythm - avg HR of 90 bpm. 2. One 6-beat run NSVT 3. Five runs of SVT - the longest lasting 24.6 secs with an avg rate of 119 bpm. Isolated SVEs were rare (<1.0%) 4. Occasional PVCs (4.0%, [id number]) - however on one day there was 10.5% burden and another ay 8.7% burden.    Echo 06/19/20: EF 40-45% RV ok   Past Medical History:  Diagnosis Date   Anxiety    Arthritis    Asthma    Barrett's esophagus    Chest pain    Collagen vascular  disease ([place])    Colon polyps    Coronary artery disease    Depression    Dyslipidemia    GERD (gastroesophageal reflux disease)    occ   Hyperlipidemia    Hypertension    Myocardial infarct [facility]) [id number]   Sciatica    Sleep apnea    no CPAP ordered but using oxygen at bedtime   Tobacco abuse     Current Outpatient Medications  Medication Sig Dispense Refill   acetaminophen (TYLENOL) 500 MG tablet Take 1,500 mg by mouth daily as needed (pain).     albuterol (PROVENTIL HFA) 108 (90 Base) MCG/ACT inhaler Inhale 2 puffs by mouth every 4 hours as needed 6.7 g 5   albuterol (PROVENTIL) (2.5 MG/3ML) 0.083% nebulizer solution INHALE 1 VIAL VIA NEBULIZER EVERY 6 HOURS AS NEEDED 90 mL 11   aspirin 81 MG EC tablet Take 1 tablet (81 mg total) by mouth daily. 90 tablet 3   atorvastatin (LIPITOR) 80 MG tablet Take 1 tablet (80 mg total) by mouth daily. 90 tablet 2   bisoprolol (ZEBETA) 5 MG tablet TAKE 1 TABLET (5 MG TOTAL) BY MOUTH DAILY (AM) 30 tablet 11   cetirizine (ZYRTEC) 10 MG tablet TAKE 1 TABLET BY MOUTH ONCE A DAY AS NEEDED 90 tablet 2   clopidogrel (PLAVIX) 75 MG tablet TAKE  1 TABLET (75 MG TOTAL) BY MOUTH DAILY (AM) 90 tablet 2   clotrimazole-betamethasone (LOTRISONE) cream Apply 2 times a day as needed 30 g 2   Fluticasone-Umeclidin-Vilant (TRELEGY ELLIPTA) 200-62.5-25 MCG/INH AEPB Inhale 1 puff into the lungs daily as needed (wheezing/sob).     hydrOXYzine (ATARAX/VISTARIL) 25 MG tablet Take 0.5-1 tablets (12.5-25 mg total) by mouth every 8 (eight) hours as needed for itching. 30 tablet 0   methocarbamol (ROBAXIN) 500 MG tablet Take 1 tablet (500 mg total) by mouth 2 (two) times daily. 20 tablet 0   nitroGLYCERIN (NITROSTAT) 0.4 MG SL tablet DISSOLVE 1 TABLET UNDER THE TONGUE AS NEEDED AS DIRECTED 25 tablet 5   potassium chloride (KLOR-CON) 10 MEQ tablet Take 2 tablets (20 mEq total) by mouth 2 (two) times daily. 120 tablet 11   pregabalin (LYRICA) 75 MG capsule Take 1 capsule (75  mg total) by mouth 2 (two) times daily as needed (nerve pain). 60 capsule 3   REPATHA SURECLICK 382 MG/ML SOAJ INJECT 1 DOSE INTO THE SKIN EVERY 14 (FOURTEEN) DAYS. 2 mL 11   sacubitril-valsartan (ENTRESTO) 97-103 MG Take 1 tablet by mouth 2 (two) times daily. 180 tablet 3   spironolactone (ALDACTONE) 25 MG tablet Take 1 tablet (25 mg total) by mouth daily. Please call office to schedule follow up appointment 30 tablet 3   triamcinolone cream (KENALOG) 0.1 % Apply 1 application topically 2 (two) times daily. 60 g 0   zolpidem (AMBIEN) 10 MG tablet Take 1 tablet (10 mg total) by mouth at bedtime as needed for sleep 30 tablet 0   No current facility-administered medications for this encounter.    Allergies  Allergen Reactions   Coreg [Carvedilol] Other (See Comments)    Headaches/migraine   Strawberry Extract Anaphylaxis, Hives, Swelling and Other (See Comments)    "Fresh strawberries" made the throat swell (2014)   Imdur [Isosorbide Nitrate] Other (See Comments)    Headaches    Amoxicillin Nausea And Vomiting    Did it involve swelling of the face/tongue/throat, SOB, or low BP? Yes Did it involve sudden or severe rash/hives, skin peeling, or any reaction on the inside of your mouth or nose? Yes Did you need to seek medical attention at a hospital or doctor's office? Yes When did it last happen?      within last 10 years If all above answers are "NO", may proceed with cephalosporin use.    Dilaudid [Hydromorphone Hcl] Nausea And Vomiting   Ibuprofen Other (See Comments)    Wheezing    Latex Swelling and Other (See Comments)    No reaction with Band-Aids, though   Metoprolol Other (See Comments)    Headaches and wheezing   Coconut Flavor Hives and Swelling    Throat swelling     Social History   Socioeconomic History   Marital status: Single    Spouse name: Not on file   Number of children: 2   Years of education: 12   Highest education level: High school graduate   Occupational History   Not on file  Tobacco Use   Smoking status: Every Day    Packs/day: 0.50    Years: 34.00    Pack years: 17.00    Types: Cigarettes   Smokeless tobacco: Never  Vaping Use   Vaping Use: Never used  Substance and Sexual Activity   Alcohol use: No    Alcohol/week: 0.0 standard drinks    Comment: seldom   Drug use: Yes  Types: Marijuana    Comment: 06/19/19   Sexual activity: Yes    Partners: Male    Birth control/protection: Post-menopausal  Other Topics Concern   Not on file  Social History Narrative   Not on file   Social Determinants of Health   Financial Resource Strain: Not on file  Food Insecurity: Not on file  Transportation Needs: Not on file  Physical Activity: Not on file  Stress: Not on file  Social Connections: Not on file  Intimate Partner Violence: Not on file     Family History  Problem Relation Age of Onset   Leukemia Mother    Clotting disorder Father        blood clot   Hypertension Sister    Diabetes Sister    Stroke Sister 47   Bleeding Disorder Son        ITP "free bleeding disorder"   Colon cancer Paternal Grandmother    Diabetes Paternal Grandmother    Stomach cancer Neg Hx    Pancreatic cancer Neg Hx     Vitals:   04/10/22 1011  BP: 102/68  Pulse: 69  Weight: 100.9 kg (222 lb 6.4 oz)    Wt Readings from Last 3 Encounters:  04/10/22 100.9 kg (222 lb 6.4 oz)  11/02/21 97.5 kg (215 lb)  10/13/21 97.1 kg (214 lb)    PHYSICAL EXAM: General:  Well appearing. No resp difficulty HEENT: normal Neck: supple. no JVD. Carotids 2+ bilat; no bruits. No lymphadenopathy or thryomegaly appreciated. Cor: PMI nondisplaced. Regular rate & rhythm. No rubs, gallops or murmurs. Lungs: clear Abdomen: obese soft, nontender, nondistended. No hepatosplenomegaly. No bruits or masses. Good bowel sounds. Extremities: no cyanosis, clubbing, rash, edema Neuro: alert & orientedx3, cranial nerves grossly intact. moves all 4  extremities w/o difficulty. Affect pleasant  ECG: Sinus brady 47 Personally reviewed  ASSESSMENT & PLAN:  1. Chronic systolic CHF due to iCM - Echo 10/20 EF 25-30% - Echo 05/2020 EF 40-45%  - Echo 02/2021 EF 30-35%  - Echo 11/22 EF 50% - Doing well. NYHA I-I Volume status improved. Off lasix - On bisoprolol 2.'5mg'$  daily (failed 5 due to bradycardia)Intolerant metoprolol & carvedilol due to headache.   - Continue  entresto 97-103 once she receives in the mail.  - Continue spironolactone 25 mg daily. - Continue hydralazine. Intolerant imdur due to migraine.  - Off Farxiga due to AKI - Recent labs with PCP ok    2. CAD S/P multiple PCIs - s/p PCI in [place] - s/p OM PCI '08-Dr Claiborne Billings - s/p LAD PCI/DES 2012 - s/p pLAD PCI with DES 06/19/2019- Dr Gwenlyn Found after patient turned down for CABG. Known occluded RCA with L-R collaterals, 65% mCFX-EF 25% - No s/s angina - Continue ASA/statin/repatha - Managed by Dr. Gwenlyn Found.  - Consider referral to PharmD for [place]   3. HTN - Blood pressure well controlled. Continue current regimen.   4. COPD with ongoing tobacco use - Discussed need for complete cessation   5. OSA: - Intolerant CPAP.   6. PVCs - See Zio report. -  Not ideal amio candidate with lung disease.  7. Obesity Body mass index is 43.43 kg/m.  -  Consider referral to PharmD for [place]  8. Pre-op for dental surgery - ok to proceed with surgery from cardiac perspective (low risk)  Repeat echo if EF stable can graduate from HF Program and f/u with Dr. Gwenlyn Found.    Glori Bickers MD 04/10/22 10:43 AM

## 2022-04-13 ENCOUNTER — Ambulatory Visit (INDEPENDENT_AMBULATORY_CARE_PROVIDER_SITE_OTHER): Payer: Medicaid Other | Admitting: Nurse Practitioner

## 2022-04-13 ENCOUNTER — Encounter: Payer: Self-pay | Admitting: Nurse Practitioner

## 2022-04-13 VITALS — BP 136/74 | HR 69 | Ht 61.0 in | Wt 222.4 lb

## 2022-04-13 DIAGNOSIS — Z720 Tobacco use: Secondary | ICD-10-CM

## 2022-04-13 DIAGNOSIS — I2510 Atherosclerotic heart disease of native coronary artery without angina pectoris: Secondary | ICD-10-CM | POA: Diagnosis not present

## 2022-04-13 DIAGNOSIS — E785 Hyperlipidemia, unspecified: Secondary | ICD-10-CM | POA: Diagnosis not present

## 2022-04-13 DIAGNOSIS — I493 Ventricular premature depolarization: Secondary | ICD-10-CM

## 2022-04-13 DIAGNOSIS — I4729 Other ventricular tachycardia: Secondary | ICD-10-CM

## 2022-04-13 DIAGNOSIS — I5022 Chronic systolic (congestive) heart failure: Secondary | ICD-10-CM | POA: Diagnosis not present

## 2022-04-13 DIAGNOSIS — I10 Essential (primary) hypertension: Secondary | ICD-10-CM

## 2022-04-13 DIAGNOSIS — M7989 Other specified soft tissue disorders: Secondary | ICD-10-CM

## 2022-04-13 DIAGNOSIS — I471 Supraventricular tachycardia: Secondary | ICD-10-CM

## 2022-04-13 DIAGNOSIS — J449 Chronic obstructive pulmonary disease, unspecified: Secondary | ICD-10-CM

## 2022-04-13 DIAGNOSIS — G4733 Obstructive sleep apnea (adult) (pediatric): Secondary | ICD-10-CM

## 2022-04-13 DIAGNOSIS — Z6841 Body Mass Index (BMI) 40.0 and over, adult: Secondary | ICD-10-CM

## 2022-04-13 DIAGNOSIS — I255 Ischemic cardiomyopathy: Secondary | ICD-10-CM | POA: Diagnosis not present

## 2022-04-13 DIAGNOSIS — Z01810 Encounter for preprocedural cardiovascular examination: Secondary | ICD-10-CM

## 2022-04-13 NOTE — Progress Notes (Signed)
Office Visit    Patient Name: Tracey Manning Date of Encounter: 04/13/2022  Primary Care Provider:  Nolene Ebbs, MD Primary Cardiologist:  Quay Burow, MD  Chief Complaint    61 year old female with a history of CAD s/p multiple PCIs, chronic systolic heart failure, ICM, PVCs, hypertension, hyperlipidemia, OSA (intolerant to CPAP), prediabetes, COPD, tobacco use, GERD, anxiety, and depression who presents for follow-up related to CAD and heart failure.  Past Medical History    Past Medical History:  Diagnosis Date   Anxiety    Arthritis    Asthma    Barrett's esophagus    Chest pain    Collagen vascular disease ([place])    Colon polyps    Coronary artery disease    Depression    Dyslipidemia    GERD (gastroesophageal reflux disease)    occ   Hyperlipidemia    Hypertension    Myocardial infarct ([place]) [id number]   Sciatica    Sleep apnea    no CPAP ordered but using oxygen at bedtime   Tobacco abuse    Past Surgical History:  Procedure Laterality Date   CARDIAC CATHETERIZATION  10/09/2003   [place]) - LAD with 30% prox narrowing, 50% stenosis in mid-portion of PLA; RCA with 40% narrowing proximally ([name], [name])   CARDIAC CATHETERIZATION  10/10/2007   70% stenosis in first septal perforator branch of LAD, 60-70% narrowing in mid LAD, 20% narrowing in mid AV groove Cfx with 80% diffuse narrowing in small distal marginal, total occlusion of mid RCA with L to R collaterals, 90% stenosis diffusely in prox branch of RCA followed by 70% stenosis in secondary curve & 80% stenosis in small marginal branch ([name])   CARDIAC CATHETERIZATION  05/28/2008   normal L main, RCA with 100% prox lesion w/distal filling from LAD collaterals, LAD with 20% prox tubular lesion/ 40% mid LAD lesion/previous stent patent ([name])   CARDIAC CATHETERIZATION  09/15/2009   discrete 100% osital RCA lesion, 50% prox LAD lesion, non-obstructive disease  in all coronaries ([name])   [place] W/ POLYPECTOMY     CORONARY ANGIOPLASTY WITH STENT PLACEMENT  10/31/2007   PCI of distal Cfx with 2.25x34m Taxus Adam DES, 60% narrowing of mid LAD (Dr. TCorky Downs   CORONARY ANGIOPLASTY WITH STENT PLACEMENT  06/11/2011   PCI of prox-mid LAD with 3x141mDES Resolute (Dr. T.[name]  CORONARY STENT INTERVENTION N/A 06/19/2019   Procedure: CORONARY STENT INTERVENTION;  Surgeon: BeLorretta HarpMD;  [facility];  Service: Cardiovascular;  Laterality: N/A;  LAD   DILATION AND CURETTAGE, DIAGNOSTIC / THERAPEUTIC     DILITATION & CURRETTAGE/HYSTROSCOPY WITH HYDROTHERMAL ABLATION N/A 04/03/2016   Procedure: DILATATION & CURETTAGE/HYSTEROSCOPY WITH HYDROTHERMAL ABLATION;  Surgeon: ChShelly BombardMD;  [facility];  Service: Gynecology;  Laterality: N/A;   NM MYOCAR PERF WALL MOTION  08/2009   persantine myoview - normal perfusion in all regions, perfusion defect in anterior region (breast attenuation), EF 52%, low risk scan   RIGHT/LEFT HEART CATH AND CORONARY ANGIOGRAPHY N/A 06/15/2019   Procedure: RIGHT/LEFT HEART CATH AND CORONARY ANGIOGRAPHY;  Surgeon: SmBelva CromeMD;  [facility];  Service: Cardiovascular;  Laterality: N/A;    Allergies  Allergies  Allergen Reactions   Coreg [Carvedilol] Other (See Comments)    Headaches/migraine   Strawberry Extract Anaphylaxis,  Hives, Swelling and Other (See Comments)    "Fresh strawberries" made the throat swell (2014)   Imdur [Isosorbide Nitrate] Other (See Comments)    Headaches    Amoxicillin Nausea And Vomiting    Did it involve swelling of the face/tongue/throat, SOB, or low BP? Yes Did it involve sudden or severe rash/hives, skin peeling, or any reaction on the inside of your mouth or nose? Yes Did you need to seek medical attention at a hospital or doctor's office? Yes When did it last happen?      within last 10  years If all above answers are "NO", may proceed with cephalosporin use.    Dilaudid [Hydromorphone Hcl] Nausea And Vomiting   Ibuprofen Other (See Comments)    Wheezing    Latex Swelling and Other (See Comments)    No reaction with Band-Aids, though   Metoprolol Other (See Comments)    Headaches and wheezing   Coconut Flavor Hives and Swelling    Throat swelling    History of Present Illness    61 year old female with the above past medical history including CAD s/p multiple PCIs, chronic systolic heart failure, ICM, PVCs, hypertension, hyperlipidemia, OSA (intolerant to CPAP), prediabetes, COPD, tobacco use, GERD, anxiety, and depression.  She had a remote PCI in 2004 in [place]. She had a PCI-LCx in 2008 by Dr. Claiborne Billings. She has known occluded RCA with left-to-right collaterals.  She underwent DES-LAD in 2012.  Lysed in July 2020 the setting of respiratory failure, unstable angina.  Echocardiogram dysfunction with EF 25%.  Cardiac catheterization in July 2020 showed 60-70% mLCx stenosis, known occluded RCA with left-to-right collaterals, and a 95% pLAD before the previously placed stent which was 50 to 60% narrowed. She was not considered a candidate for CABG and ultimately underwent DES x2-o-pLAD. Echo in October 2020 showed EF 25 to 30%.  She was hospitalized in April 2022 with increased shortness of breath, diuresed with IV Lasix.  Echocardiogram at the time showed EF 30 to 35%.  Most recent echo in November 2020 showed EF 50%.  She has been followed by the advanced heart failure clinic.  Outpatient cardiac monitor in December 2021 showed NSVT, PSVT, and occasional PVCs. She last saw Dr. Haroldine Laws on 04/10/2022 and was stable from a cardiac standpoint.  She denied symptoms concerning for angina. Follow-up echocardiogram was recommended with potential graduation from advanced heart failure clinic if EF stable.  Of note, she is intolerant to metoprolol, carvedilol, and Imdur due to headache. She  did not tolerate Farxiga due to AKI.  She presents today for follow-up for preoperative cardiac evaluation for upcoming dental extraction (2 teeth) with relaxed dental of the [facility].  Since her last visit has done well from a cardiac standpoint.  She denies symptoms concerning for angina, denies dyspnea, edema, pnd, orthopnea, weight gain.  She notes that she has had swelling to her bilateral hands, redness, and pain.  She saw her PCP for this who started her on colchicine for possible gout, however, she states she has not noticed any improvement. Other than her ongoing hand swelling/pain, she denies any additional concerns today.  Home Medications    Current Outpatient Medications  Medication Sig Dispense Refill   acetaminophen (TYLENOL) 500 MG tablet Take 1,500 mg by mouth daily as needed (pain).     albuterol (PROVENTIL HFA) 108 (90 Base) MCG/ACT inhaler Inhale 2 puffs by mouth every 4 hours as needed 6.7 g 5   albuterol (PROVENTIL) (2.5 MG/3ML) 0.083% nebulizer solution  INHALE 1 VIAL VIA NEBULIZER EVERY 6 HOURS AS NEEDED 90 mL 11   aspirin 81 MG EC tablet Take 1 tablet (81 mg total) by mouth daily. 90 tablet 3   atorvastatin (LIPITOR) 80 MG tablet Take 1 tablet (80 mg total) by mouth daily. 90 tablet 2   bisoprolol (ZEBETA) 5 MG tablet TAKE 1 TABLET (5 MG TOTAL) BY MOUTH DAILY (AM) 30 tablet 11   cetirizine (ZYRTEC) 10 MG tablet TAKE 1 TABLET BY MOUTH ONCE A DAY AS NEEDED 90 tablet 2   clopidogrel (PLAVIX) 75 MG tablet TAKE 1 TABLET (75 MG TOTAL) BY MOUTH DAILY (AM) 90 tablet 2   clotrimazole-betamethasone (LOTRISONE) cream Apply 2 times a day as needed 30 g 2   colchicine 0.6 MG tablet Take 0.6 mg by mouth 2 (two) times daily as needed.     diclofenac Sodium (VOLTAREN) 1 % GEL SMARTSIG:4 Gram(s) Topical 4 Times Daily PRN     Fluticasone-Umeclidin-Vilant (TRELEGY ELLIPTA) 200-62.5-25 MCG/INH AEPB Inhale 1 puff into the lungs daily as needed (wheezing/sob).     hydrOXYzine (ATARAX/VISTARIL) 25  MG tablet Take 0.5-1 tablets (12.5-25 mg total) by mouth every 8 (eight) hours as needed for itching. 30 tablet 0   methocarbamol (ROBAXIN) 500 MG tablet Take 1 tablet (500 mg total) by mouth 2 (two) times daily. 20 tablet 0   nitroGLYCERIN (NITROSTAT) 0.4 MG SL tablet DISSOLVE 1 TABLET UNDER THE TONGUE AS NEEDED AS DIRECTED 25 tablet 5   potassium chloride (KLOR-CON) 10 MEQ tablet Take 2 tablets (20 mEq total) by mouth 2 (two) times daily. 120 tablet 11   potassium chloride (KLOR-CON) 10 MEQ tablet Take 20 mEq by mouth 2 (two) times daily.     pregabalin (LYRICA) 100 MG capsule Take 100 mg by mouth 2 (two) times daily.     REPATHA SURECLICK 466 MG/ML SOAJ INJECT 1 DOSE INTO THE SKIN EVERY 14 (FOURTEEN) DAYS. 2 mL 11   sacubitril-valsartan (ENTRESTO) 97-103 MG Take 1 tablet by mouth 2 (two) times daily. 180 tablet 3   spironolactone (ALDACTONE) 25 MG tablet Take 1 tablet (25 mg total) by mouth daily. Please call office to schedule follow up appointment 30 tablet 3   triamcinolone cream (KENALOG) 0.1 % Apply 1 application topically 2 (two) times daily. 60 g 0   Vitamin D, Ergocalciferol, (DRISDOL) 1.25 MG (50000 UNIT) CAPS capsule Take 50,000 Units by mouth once a week.     zolpidem (AMBIEN) 10 MG tablet Take 1 tablet (10 mg total) by mouth at bedtime as needed for sleep 30 tablet 0   No current facility-administered medications for this visit.     Review of Systems    She denies chest pain, palpitations, dyspnea, pnd, orthopnea, n, v, dizziness, syncope, edema, weight gain, or early satiety. All other systems reviewed and are otherwise negative except as noted above.   Physical Exam    VS:  BP 136/74   Pulse 69   Ht '5\' 1"'$  (1.549 m)   Wt 222 lb 6.4 oz (100.9 kg)   LMP  (LMP Unknown)   SpO2 97%   BMI 42.02 kg/m  GEN: Well nourished, well developed, in no acute distress. HEENT: normal. Neck: Supple, no JVD, carotid bruits, or masses. Cardiac: RRR, no murmurs, rubs, or gallops. No  clubbing, cyanosis, edema.  Radials/DP/PT 2+ and equal bilaterally.  Respiratory:  Respirations regular and unlabored, clear to auscultation bilaterally. GI: Soft, nontender, nondistended, BS + x 4. MS: no deformity or atrophy. Skin: warm  and dry, no rash. Neuro:  Strength and sensation are intact. Psych: Normal affect.  Accessory Clinical Findings    ECG personally reviewed by me today -NSR, 69 bpm- no acute changes.  Lab Results  Component Value Date   WBC 6.4 11/02/2021   HGB 12.5 11/02/2021   HCT 37.8 11/02/2021   MCV 84.4 11/02/2021   PLT 181 11/02/2021   Lab Results  Component Value Date   CREATININE 1.27 (H) 12/25/2021   BUN 14 12/25/2021   NA 139 12/25/2021   K 5.1 12/25/2021   CL 107 12/25/2021   CO2 25 12/25/2021   Lab Results  Component Value Date   ALT 35 11/02/2021   AST 23 11/02/2021   ALKPHOS 77 11/02/2021   BILITOT 0.5 11/02/2021   Lab Results  Component Value Date   CHOL 130 08/06/2021   HDL 42 08/06/2021   LDLCALC 47 08/06/2021   TRIG 263 (H) 08/06/2021   CHOLHDL 3.1 08/06/2021    Lab Results  Component Value Date   HGBA1C 6.4 (H) 05/19/2021    Assessment & Plan   1. Chronic systolic heart failure due to ICM: Prior EF 25-30%. Most recent echo in November 2020 showed EF 50%. Repeat echo pending per advanced heart failure clinic (if echo stable, she will no longer follow with AHF clinic). Euvolemic and well compensated on exam. Continue bisoprolol, Entresto, spironolactone.  2. CAD: S/p PCI in [place] in 2004, s/p PCI-OM in 2008, s/p DES-LAD in 2020. Stable with no anginal symptoms. No indication for ischemic evaluation.  Continue aspirin, Plavix, bisoprolol, Entresto, spironolactone, Lipitor, and Repatha.  3. Hypertension: BP well controlled. Continue current antihypertensive regimen.   4. Hyperlipidemia: No recent LDL on file.  Consider repeat lipids at next follow-up visit.  Continue Lipitor, Repatha.  5. OSA: Adherent to CPAP.  Stable.  6. PVCs/NSVT/PSVT: Outpatient cardiac monitor in December 2021 showed NSVT, PSVT, and occasional PVCs.  He denies palpitations.  Continue bisoprolol.  7. COPD with ongoing tobacco use: Continues to smoke but states she is trying to cut down. Full cessation advised.   8. Obesity: Encouraged lifestyle modifications with diet and exercise.  9. Bilateral hand swelling: Recently started on colchicine with no significant improvement, recommend follow-up with PCP.  10. Preoperative cardiac evaluation: According to the Revised Cardiac Risk Index (RCRI), her Perioperative Risk of Major Cardiac Event is (%): 6.6. Her Functional Capacity in METs is: 7.01 according to the [facility] Activity Status Index (DASI).Therefore, based on ACC/AHA guidelines, patient would be at acceptable risk for the planned procedure without further cardiovascular testing. Simple dental extractions (i.e. 1-2 teeth) are considered low risk procedures per guidelines and generally do not require any specific cardiac clearance. It is also generally accepted that for simple extractions and dental cleanings, there is no need to interrupt blood thinner therapy. SBE prophylaxis is not required for the patient from a cardiac standpoint. I will route this recommendation to the requesting party via Epic fax function.  11. Disposition: Follow-up in 4-6 months.   Lenna Sciara, NP 04/13/2022, 11:48 AM

## 2022-04-13 NOTE — Patient Instructions (Signed)
Medication Instructions:  Your physician recommends that you continue on your current medications as directed. Please refer to the Current Medication list given to you today.  *If you need a refill on your cardiac medications before your next appointment, please call your pharmacy*   Lab Work: NONE ordered at this time of appointment   If you have labs (blood work) drawn today and your tests are completely normal, you will receive your results only by: [place] (if you have MyChart) OR A paper copy in the mail If you have any lab test that is abnormal or we need to change your treatment, we will call you to review the results.   Testing/Procedures: NONE ordered at this time of appointment     Follow-Up: At [name], you and your health needs are our priority.  As part of our continuing mission to provide you with exceptional heart care, we have created designated Provider Care Teams.  These Care Teams include your primary Cardiologist (physician) and Advanced Practice Providers (APPs -  Physician Assistants and Nurse Practitioners) who all work together to provide you with the care you need, when you need it.  We recommend signing up for the patient portal called "MyChart".  Sign up information is provided on this After Visit Summary.  MyChart is used to connect with patients for Virtual Visits (Telemedicine).  Patients are able to view lab/test results, encounter notes, upcoming appointments, etc.  Non-urgent messages can be sent to your provider as well.   To learn more about what you can do with MyChart, go to [email or web address].    Your next appointment:   4-6 month(s)  The format for your next appointment:   In Person  Provider:   Quay Burow, MD     Other Instructions   Important Information About Sugar

## 2022-04-23 ENCOUNTER — Other Ambulatory Visit (HOSPITAL_COMMUNITY): Payer: Self-pay | Admitting: Adult Health

## 2022-04-25 ENCOUNTER — Encounter (HOSPITAL_COMMUNITY): Payer: Self-pay

## 2022-04-25 ENCOUNTER — Emergency Department (HOSPITAL_COMMUNITY)
Admission: EM | Admit: 2022-04-25 | Discharge: 2022-04-25 | Disposition: A | Discharge status: Home / Self Care | Payer: Medicaid Other | Attending: Emergency Medicine | Admitting: Emergency Medicine

## 2022-04-25 DIAGNOSIS — Z7982 Long term (current) use of aspirin: Secondary | ICD-10-CM | POA: Insufficient documentation

## 2022-04-25 DIAGNOSIS — Z91040 Latex allergy status: Secondary | ICD-10-CM | POA: Diagnosis not present

## 2022-04-25 DIAGNOSIS — K0889 Other specified disorders of teeth and supporting structures: Secondary | ICD-10-CM | POA: Insufficient documentation

## 2022-04-25 DIAGNOSIS — Z79899 Other long term (current) drug therapy: Secondary | ICD-10-CM | POA: Diagnosis not present

## 2022-04-25 MED ORDER — ACETAMINOPHEN-CODEINE 300-30 MG PO TABS: 1.0000 | ORAL_TABLET | Freq: Four times a day (QID) | ORAL | 0 refills | Status: DC | PRN

## 2022-04-25 NOTE — ED Triage Notes (Signed)
Pt presents with c/o dental pain. Pt reports she had two teeth pulled yesterday and that her dentist never called in her prescriptions for pain. Pt reports the pain is intense.

## 2022-04-25 NOTE — Discharge Instructions (Signed)
Your exam today was reassuring.  No signs of infection noted.  You stated that your dentist was not able to send in your pain medication.  You are not able to get a hold of them over the weekend and there is no after our call.  I have sent a short course of pain medications to your pharmacy.  Please call your dentist Monday to follow-up and get additional pain medication if needed.

## 2022-04-25 NOTE — ED Provider Notes (Signed)
[place] DEPT Provider Note   CSN: [id number] Arrival date & time: 04/25/22  1228     History  Chief Complaint  Patient presents with   Dental Pain    Tracey Manning is a 61 y.o. female.  61 year old female presents today for evaluation of dental pain.  She had 2 dental extractions yesterday.  She states somehow her medications did not make it to the pharmacy.  She states she will swap and at her gauze like she was supposed to.  She states the clinic called to check on her yesterday and she requested to advance her diet.  She was given permission to as she could tolerate and she had to tuna sandwiches over the course of yesterday.  She states overnight her pain severely worsened she has not been able to sleep since.  She has taken multiple Tylenol tablets without improvement.  Denies fever, chills, drainage, or bleeding.  Just reports significant pain.  The history is provided by the patient. No language interpreter was used.      Home Medications Prior to Admission medications   Medication Sig Start Date End Date Taking? Authorizing Provider  acetaminophen (TYLENOL) 500 MG tablet Take 1,500 mg by mouth daily as needed (pain).    [provider]  albuterol (PROVENTIL HFA) 108 (90 Base) MCG/ACT inhaler Inhale 2 puffs by mouth every 4 hours as needed 05/02/21     albuterol (PROVENTIL) (2.5 MG/3ML) 0.083% nebulizer solution INHALE 1 VIAL VIA NEBULIZER EVERY 6 HOURS AS NEEDED Patient taking differently: Take 2.5 mg by nebulization every 6 (six) hours as needed for wheezing or shortness of breath. 02/21/21 04/11/23  Nolene Ebbs, MD  aspirin 81 MG EC tablet Take 1 tablet (81 mg total) by mouth daily. 03/12/21   Kroeger, Lorelee Cover., PA-C  atorvastatin (LIPITOR) 80 MG tablet Take 1 tablet (80 mg total) by mouth daily. 05/02/21     bisoprolol (ZEBETA) 5 MG tablet TAKE 1 TABLET (5 MG TOTAL) BY MOUTH DAILY (AM) Patient taking differently: Take 5 mg by mouth  daily. 10/21/21   Bensimhon, Shaune Pascal, MD  cephALEXin (KEFLEX) 500 MG capsule Take 500 mg by mouth every 12 (twelve) hours. 04/24/22   [provider]  cetirizine (ZYRTEC) 10 MG tablet TAKE 1 TABLET BY MOUTH ONCE A DAY AS NEEDED Patient taking differently: Take 10 mg by mouth daily as needed for allergies. 02/21/21 04/11/23  Nolene Ebbs, MD  clopidogrel (PLAVIX) 75 MG tablet TAKE 1 TABLET (75 MG TOTAL) BY MOUTH DAILY (AM) Patient taking differently: Take 75 mg by mouth daily. 02/20/22   Bensimhon, Shaune Pascal, MD  colchicine 0.6 MG tablet Take 0.6 mg by mouth 2 (two) times daily as needed. 04/02/22   [provider]  diclofenac Sodium (VOLTAREN) 1 % GEL Apply 4 g topically 4 (four) times daily as needed (pain). 03/24/22   [provider]  ENTRESTO 97-103 MG TAKE 1 TABLET BY MOUTH 2 (TWO) TIMES DAILY (AM+BEDTIME) Patient taking differently: Take 1 tablet by mouth 2 (two) times daily. 04/23/22   Bensimhon, Shaune Pascal, MD  Fluticasone-Umeclidin-Vilant (TRELEGY ELLIPTA) 200-62.5-25 MCG/INH AEPB Inhale 1 puff into the lungs daily as needed (wheezing/sob).    [provider]  nitroGLYCERIN (NITROSTAT) 0.4 MG SL tablet DISSOLVE 1 TABLET UNDER THE TONGUE AS NEEDED AS DIRECTED Patient taking differently: Place 0.4 mg under the tongue every 5 (five) minutes as needed for chest pain. 03/12/21 04/11/23  Kroeger, Lorelee Cover., PA-C  potassium chloride (KLOR-CON) 10 MEQ  tablet TAKE 2 TABLETS BY MOUTH 2 (TWO) TIMES DAILY. (AM+BEDTIME) Patient taking differently: Take 20 mEq by mouth 2 (two) times daily. 04/23/22   Bensimhon, Shaune Pascal, MD  pregabalin (LYRICA) 100 MG capsule Take 100 mg by mouth 2 (two) times daily. 03/24/22   [provider]  REPATHA SURECLICK 720 MG/ML SOAJ INJECT 1 DOSE INTO THE SKIN EVERY 14 (FOURTEEN) DAYS. Patient taking differently: Inject 140 mg into the skin every 14 (fourteen) days. 11/26/21   Hilty, Nadean Corwin, MD  spironolactone (ALDACTONE) 25 MG tablet TAKE 1 TABLET  (25 MG TOTAL) BY MOUTH DAILY. (AM) Patient taking differently: Take 25 mg by mouth daily. 04/23/22   Bensimhon, Shaune Pascal, MD  triamcinolone cream (KENALOG) 0.1 % Apply 1 application topically 2 (two) times daily. 05/27/21   Jaynee Eagles, PA-C  Vitamin D, Ergocalciferol, (DRISDOL) 1.25 MG (50000 UNIT) CAPS capsule Take 50,000 Units by mouth once a week. 03/24/22   [provider]  zolpidem (AMBIEN) 10 MG tablet Take 1 tablet (10 mg total) by mouth at bedtime as needed for sleep 03/20/21         Allergies    Coreg [carvedilol], Strawberry extract, Imdur [isosorbide nitrate], Amoxicillin, Dilaudid [hydromorphone hcl], Ibuprofen, Latex, Metoprolol, and Coconut flavor    Review of Systems   Review of Systems  Constitutional:  Negative for chills and fever.  HENT:  Positive for dental problem. Negative for drooling and trouble swallowing.   All other systems reviewed and are negative.  Physical Exam Updated Vital Signs BP (!) 157/97 (BP Location: Left Arm)   Pulse 84   Temp 98.2 F (36.8 C) (Oral)   Resp 18   LMP  (LMP Unknown)   SpO2 97%  Physical Exam Vitals and nursing note reviewed.  Constitutional:      General: She is not in acute distress.    Appearance: Normal appearance. She is not ill-appearing.  HENT:     Head: Normocephalic and atraumatic.     Nose: Nose normal.     Mouth/Throat:      Comments: Without evidence of retropharyngeal abscess, peritonsillar cyst, dental abscess.  Without trismus or Ludwick's angina. Eyes:     General: No scleral icterus.    Extraocular Movements: Extraocular movements intact.     Conjunctiva/sclera: Conjunctivae normal.  Cardiovascular:     Rate and Rhythm: Normal rate and regular rhythm.     Pulses: Normal pulses.     Heart sounds: Normal heart sounds.  Pulmonary:     Effort: Pulmonary effort is normal. No respiratory distress.     Breath sounds: Normal breath sounds. No wheezing or rales.  Abdominal:     General: There is no  distension.     Tenderness: There is no abdominal tenderness.  Musculoskeletal:        General: Normal range of motion.     Cervical back: Normal range of motion.  Skin:    General: Skin is warm and dry.  Neurological:     General: No focal deficit present.     Mental Status: She is alert. Mental status is at baseline.    ED Results / Procedures / Treatments   Labs (all labs ordered are listed, but only abnormal results are displayed) Labs Reviewed - No data to display  EKG None  Radiology No results found.  Procedures Procedures    Medications Ordered in ED Medications - No data to display  ED Course/ Medical Decision Making/ A&P  Medical Decision Making  24-year-old female presents with dental pain.  Had extractions done yesterday.  States her pain medication was not sent in.  Reports severe pain since last night.  Appears to be in tears on exam.  Without fever, or evidence of abscess, trismus, or Ludwick's angina on exam.  We will provide her a few doses of pain medication to get her through the weekend until she can follow-up with her dentist.  Patient voices understanding and is in agreement with plan.   Final Clinical Impression(s) / ED Diagnoses Final diagnoses:  Pain, dental    Rx / DC Orders ED Discharge Orders          Ordered    acetaminophen-codeine (TYLENOL #3) 300-30 MG tablet  Every 6 hours PRN        04/25/22 1335              Evlyn Courier, PA-C 04/25/22 1340    Milton Ferguson, MD 04/25/22 1723

## 2022-04-30 ENCOUNTER — Ambulatory Visit (HOSPITAL_COMMUNITY)
Admission: RE | Admit: 2022-04-30 | Discharge: 2022-04-30 | Disposition: A | Discharge status: Home / Self Care | Payer: Medicaid Other | Source: Ambulatory Visit | Attending: Internal Medicine | Admitting: Internal Medicine

## 2022-04-30 DIAGNOSIS — F17200 Nicotine dependence, unspecified, uncomplicated: Secondary | ICD-10-CM | POA: Insufficient documentation

## 2022-04-30 DIAGNOSIS — I700 Atherosclerosis of aorta: Secondary | ICD-10-CM | POA: Diagnosis not present

## 2022-04-30 DIAGNOSIS — I2510 Atherosclerotic heart disease of native coronary artery without angina pectoris: Secondary | ICD-10-CM | POA: Diagnosis not present

## 2022-04-30 DIAGNOSIS — I429 Cardiomyopathy, unspecified: Secondary | ICD-10-CM | POA: Diagnosis not present

## 2022-04-30 DIAGNOSIS — I5022 Chronic systolic (congestive) heart failure: Secondary | ICD-10-CM | POA: Diagnosis not present

## 2022-04-30 DIAGNOSIS — J449 Chronic obstructive pulmonary disease, unspecified: Secondary | ICD-10-CM | POA: Insufficient documentation

## 2022-04-30 DIAGNOSIS — I110 Hypertensive heart disease with heart failure: Secondary | ICD-10-CM | POA: Insufficient documentation

## 2022-04-30 DIAGNOSIS — E785 Hyperlipidemia, unspecified: Secondary | ICD-10-CM | POA: Diagnosis not present

## 2022-04-30 LAB — ECHOCARDIOGRAM COMPLETE
Area-P 1/2: 3.21 cm2
Calc EF: 47.7 %
S' Lateral: 4.1 cm
Single Plane A2C EF: 45.7 %
Single Plane A4C EF: 48.8 %

## 2022-05-20 ENCOUNTER — Telehealth: Payer: Self-pay | Admitting: Internal Medicine

## 2022-05-20 NOTE — Telephone Encounter (Signed)
PA for repatha sureclick submitted via CMM (Key: [id number])

## 2022-05-20 NOTE — Telephone Encounter (Signed)
Request Reference Number: [id number]. REPATHA SURE INJ '140MG'$ /ML is approved through 05/21/2023

## 2022-07-30 ENCOUNTER — Other Ambulatory Visit (HOSPITAL_COMMUNITY)
Admission: RE | Admit: 2022-07-30 | Discharge: 2022-07-30 | Disposition: A | Discharge status: Home / Self Care | Payer: Medicaid Other | Source: Ambulatory Visit | Attending: Obstetrics & Gynecology | Admitting: Obstetrics & Gynecology

## 2022-07-30 ENCOUNTER — Ambulatory Visit (INDEPENDENT_AMBULATORY_CARE_PROVIDER_SITE_OTHER): Payer: Medicaid Other | Admitting: Obstetrics and Gynecology

## 2022-07-30 ENCOUNTER — Encounter: Payer: Self-pay | Admitting: Obstetrics and Gynecology

## 2022-07-30 VITALS — BP 112/76 | HR 73 | Wt 214.4 lb

## 2022-07-30 DIAGNOSIS — N898 Other specified noninflammatory disorders of vagina: Secondary | ICD-10-CM | POA: Diagnosis present

## 2022-07-30 NOTE — Progress Notes (Signed)
61 yo P2 postmenopausal with BMI 40 presenting today for the evaluation of vaginal irritation. Patient admits to having a new sexual partner. She reports some occasional dyspareunia. She uses a vaginal lubricant with some improvement. She also reports some bilateral hip pain with intercourse. Patient denies the presence of a vaginal discharge. She denies any vaginal bleeding  Past Medical History:  Diagnosis Date   Anxiety    Arthritis    Asthma    Barrett's esophagus    Chest pain    Collagen vascular disease ([place])    Colon polyps    Coronary artery disease    Depression    Dyslipidemia    GERD (gastroesophageal reflux disease)    occ   Hyperlipidemia    Hypertension    Myocardial infarct ([place]) 1308,6578   Sciatica    Sleep apnea    no CPAP ordered but using oxygen at bedtime   Tobacco abuse    Past Surgical History:  Procedure Laterality Date   CARDIAC CATHETERIZATION  10/09/2003   [place], [place]) - LAD with 30% prox narrowing, 50% stenosis in mid-portion of PLA; RCA with 40% narrowing proximally ([name], [name])   CARDIAC CATHETERIZATION  10/10/2007   70% stenosis in first septal perforator branch of LAD, 60-70% narrowing in mid LAD, 20% narrowing in mid AV groove Cfx with 80% diffuse narrowing in small distal marginal, total occlusion of mid RCA with L to R collaterals, 90% stenosis diffusely in prox branch of RCA followed by 70% stenosis in secondary curve & 80% stenosis in small marginal branch ([name])   CARDIAC CATHETERIZATION  05/28/2008   normal L main, RCA with 100% prox lesion w/distal filling from LAD collaterals, LAD with 20% prox tubular lesion/ 40% mid LAD lesion/previous stent patent ([name])   CARDIAC CATHETERIZATION  09/15/2009   discrete 100% osital RCA lesion, 50% prox LAD lesion, non-obstructive disease in all coronaries ([name])   [place] W/ POLYPECTOMY     CORONARY  ANGIOPLASTY WITH STENT PLACEMENT  10/31/2007   PCI of distal Cfx with 2.25x45m Taxus Adam DES, 60% narrowing of mid LAD (Dr. TCorky Downs   CORONARY ANGIOPLASTY WITH STENT PLACEMENT  06/11/2011   PCI of prox-mid LAD with 3x160mDES Resolute (Dr. T.[name]  CORONARY STENT INTERVENTION N/A 06/19/2019   Procedure: CORONARY STENT INTERVENTION;  Surgeon: BeLorretta HarpMD;  Location: [facility];  Service: Cardiovascular;  Laterality: N/A;  LAD   DILATION AND CURETTAGE, DIAGNOSTIC / THERAPEUTIC     DILITATION & CURRETTAGE/HYSTROSCOPY WITH HYDROTHERMAL ABLATION N/A 04/03/2016   Procedure: DILATATION & CURETTAGE/HYSTEROSCOPY WITH HYDROTHERMAL ABLATION;  Surgeon: ChShelly BombardMD;  [facility];  Service: Gynecology;  Laterality: N/A;   NM MYOCAR PERF WALL MOTION  08/2009   persantine myoview - normal perfusion in all regions, perfusion defect in anterior region (breast attenuation), EF 52%, low risk scan   RIGHT/LEFT HEART CATH AND CORONARY ANGIOGRAPHY N/A 06/15/2019   Procedure: RIGHT/LEFT HEART CATH AND CORONARY ANGIOGRAPHY;  Surgeon: SmBelva CromeMD;  [facility];  Service: Cardiovascular;  Laterality: N/A;   Family History  Problem Relation Age of Onset   Leukemia Mother    Clotting disorder Father        blood clot   Hypertension Sister    Diabetes Sister    Stroke Sister 40   Bleeding Disorder  Son        ITP "free bleeding disorder"   Colon cancer Paternal Grandmother    Diabetes Paternal Grandmother    Stomach cancer Neg Hx    Pancreatic cancer Neg Hx    Social History   Tobacco Use   Smoking status: Every Day    Packs/day: 0.50    Years: 34.00    Total pack years: 17.00    Types: Cigarettes   Smokeless tobacco: Never  Vaping Use   Vaping Use: Never used  Substance Use Topics   Alcohol use: No    Alcohol/week: 0.0 standard drinks of alcohol    Comment: seldom   Drug use: Yes    Types: Marijuana    Comment: 06/19/19   ROS See pertinent  in HPI. All other systems reviewed and non contributory Blood pressure 112/76, pulse 73, weight 214 lb 6.4 oz (97.3 kg). GENERAL: Well-developed, well-nourished female in no acute distress.  ABDOMEN: Soft, nontender, nondistended. No organomegaly. PELVIC: Normal external female genitalia. Vagina is pale and rugated.  Normal discharge. Normal appearing cervix. Uterus is normal in size. No adnexal mass or tenderness. Chaperone present during the pelvic exam EXTREMITIES: No cyanosis, clubbing, or edema, 2+ distal pulses.  A/P 61 yo with vaginal irritation - vaginal swab collected to rule our infectious process - encouraged the continued use of water-based vaginal lubricant - Advised to follow up with PCP as patient has a history of arthritic pain in the hips - Patient due for pap smear next year and opted to defer until then

## 2022-07-30 NOTE — Progress Notes (Signed)
Patient presents for vaginal irritation and itching since July. New sexual partner in 3 years, pelvic pain and spotting with intercourse. Reports lesion on rectum for last few days.  Pap due 2024.

## 2022-07-31 LAB — CERVICOVAGINAL ANCILLARY ONLY
Bacterial Vaginitis (gardnerella): NEGATIVE
Candida Glabrata: NEGATIVE
Candida Vaginitis: POSITIVE — AB
Chlamydia: NEGATIVE
Comment: NEGATIVE
Comment: NEGATIVE
Comment: NEGATIVE
Comment: NEGATIVE
Comment: NEGATIVE
Comment: NORMAL
Neisseria Gonorrhea: NEGATIVE
Trichomonas: NEGATIVE

## 2022-08-01 MED ORDER — TERCONAZOLE 0.8 % VA CREA: 1.0000 | TOPICAL_CREAM | Freq: Every day | VAGINAL | 0 refills | Status: DC

## 2022-08-01 NOTE — Addendum Note (Signed)
Addended by: Mora Bellman on: 08/01/2022 09:33 AM   Modules accepted: Orders

## 2022-08-03 ENCOUNTER — Telehealth: Payer: Self-pay | Admitting: Emergency Medicine

## 2022-08-03 NOTE — Telephone Encounter (Signed)
TC to patient to discuss results and Rx.

## 2022-09-25 ENCOUNTER — Other Ambulatory Visit (HOSPITAL_COMMUNITY): Payer: Self-pay | Admitting: Internal Medicine

## 2022-09-29 ENCOUNTER — Encounter: Payer: Self-pay | Admitting: Cardiovascular Disease

## 2022-09-29 ENCOUNTER — Ambulatory Visit: Payer: Medicaid Other | Attending: Cardiovascular Disease | Admitting: Cardiovascular Disease

## 2022-09-29 VITALS — BP 138/90 | HR 91 | Ht 61.0 in | Wt 216.8 lb

## 2022-09-29 DIAGNOSIS — E785 Hyperlipidemia, unspecified: Secondary | ICD-10-CM

## 2022-09-29 DIAGNOSIS — Z9861 Coronary angioplasty status: Secondary | ICD-10-CM | POA: Diagnosis not present

## 2022-09-29 DIAGNOSIS — I10 Essential (primary) hypertension: Secondary | ICD-10-CM | POA: Diagnosis not present

## 2022-09-29 DIAGNOSIS — I2510 Atherosclerotic heart disease of native coronary artery without angina pectoris: Secondary | ICD-10-CM | POA: Diagnosis not present

## 2022-09-29 DIAGNOSIS — I5043 Acute on chronic combined systolic (congestive) and diastolic (congestive) heart failure: Secondary | ICD-10-CM

## 2022-09-29 NOTE — Patient Instructions (Signed)
Medication Instructions:  Your physician recommends that you continue on your current medications as directed. Please refer to the Current Medication list given to you today.  *If you need a refill on your cardiac medications before your next appointment, please call your pharmacy*   Follow-Up: At [facility], you and your health needs are our priority.  As part of our continuing mission to provide you with exceptional heart care, we have created designated Provider Care Teams.  These Care Teams include your primary Cardiologist (physician) and Advanced Practice Providers (APPs -  Physician Assistants and Nurse Practitioners) who all work together to provide you with the care you need, when you need it.  We recommend signing up for the patient portal called "MyChart".  Sign up information is provided on this After Visit Summary.  MyChart is used to connect with patients for Virtual Visits (Telemedicine).  Patients are able to view lab/test results, encounter notes, upcoming appointments, etc.  Non-urgent messages can be sent to your provider as well.   To learn more about what you can do with MyChart, go to [email or web address].    Your next appointment:   6 month(s)  The format for your next appointment:   In Person  Provider:   Diona Browner, NP        Then, Quay Burow, MD will plan to see you again in 12 month(s).

## 2022-09-29 NOTE — Assessment & Plan Note (Signed)
Her most sent 2D echocardiogram performed 04/30/2022 revealed EF of 50 to 55% up from 25 to 30% in the past.  She is on high-dose Entresto but intolerant to beta blockade.  She is completely asymptomatic.

## 2022-09-29 NOTE — Assessment & Plan Note (Signed)
History of dyslipidemia on atorvastatin 80 mg a day as well as Repatha with a lipid profile performed 08/06/2021 revealing a total cholesterol of 130, LDL 47 and HDL 42.

## 2022-09-29 NOTE — Assessment & Plan Note (Signed)
History of essential hypertension blood pressure measured today at 138/90.  She is on Entresto 97/103 but apparently is intolerant to beta-blockers.

## 2022-09-29 NOTE — Assessment & Plan Note (Signed)
History of CAD status post heart attacks dating back as long as 2004.  She had circumflex stenting by Dr. Einar Gip December about 8 and proximal LAD stenting by Dr. Claiborne Billings 06/11/2011.  I performed cardiac catheterization on her after she was turned down for bypass grafting 06/19/2019.  I placed a 3.5 x 12 mm long stent of the ostium of her LAD and in her proximal LAD as well.  She is currently asymptomatic.

## 2022-09-29 NOTE — Progress Notes (Signed)
09/29/2022 Tracey Manning   [birth year]  [id number]  Primary Physician Nolene Ebbs, MD Primary Cardiologist: [name] MD [name], [place]  HPI:  Tracey Manning is a 61 y.o.  moderately overweight single [place] American female mother of 2 children, grandmother and 2 grandchildren who currently is out of work on [employer] and disability. I last saw her in the office 10/11/2020.She was referred by Dr. Shawna Orleans for cardiovascular evaluation because of chest pain. Her cardiac risk factor profile is positive for 20-pack-years of tobacco abuse currently smoking one pack per day, treated type of pressure and high cholesterol. She has had multiple heart attacks in the past dating back to 2004. She had a stent placed in her circumflex by Dr. Einar Gip December 2008 (Taxus 2.5 x 24 mm). She had a Medtronic stent placed in her proximal LAD by Dr. Claiborne Billings 06/11/11. Over the last 6 months she has developed recurrent chest pain as well as dyspnea on exertion. Since I saw her on 04/04/14 I obtained a Myoview stress test which was normal and a 2-D echo which revealed normal LV function. Since I saw her a year ago, she's done well clinically. She gets occasional chest pain which has not changed in frequency or severity. Her blood pressure is somewhat elevated due to back and abdominal pain however.   She was admitted in July with chest pain and shortness of breath.  Cardiac cath performed Dr. Tamala Julian revealed an occluded RCA with left-to-right collaterals, moderate AV groove circumflex disease, patent proximal LAD stent with high-grade disease just proximal to this.  After she was evaluated by cardiothoracic surgery and turned down for bypass grafting I performed ostial/proximal LAD intervention 06/19/2019 with a 3.5 mm x 12 mm long Synergy drug-eluting stent.   Since I saw her in the office 2 years ago she continues to do well.  She has seen Dr. Haroldine Laws as well for heart failure.  Her ejection  fraction has increased from 40 to 45% up to 50 to 55% most recently 04/30/2022.  She has no symptoms of heart failure.  She still smoking 5 cigarettes a day.   Current Meds  Medication Sig   acetaminophen (TYLENOL) 500 MG tablet Take 1,500 mg by mouth daily as needed (pain).   acetaminophen-codeine (TYLENOL #3) 300-30 MG tablet Take 1-2 tablets by mouth every 6 (six) hours as needed for moderate pain.   albuterol (PROVENTIL HFA) 108 (90 Base) MCG/ACT inhaler Inhale 2 puffs by mouth every 4 hours as needed   albuterol (PROVENTIL) (2.5 MG/3ML) 0.083% nebulizer solution INHALE 1 VIAL VIA NEBULIZER EVERY 6 HOURS AS NEEDED (Patient taking differently: Take 2.5 mg by nebulization every 6 (six) hours as needed for wheezing or shortness of breath.)   aspirin 81 MG EC tablet Take 1 tablet (81 mg total) by mouth daily.   atorvastatin (LIPITOR) 80 MG tablet Take 1 tablet (80 mg total) by mouth daily.   cetirizine (ZYRTEC) 10 MG tablet TAKE 1 TABLET BY MOUTH ONCE A DAY AS NEEDED (Patient taking differently: Take 10 mg by mouth daily as needed for allergies.)   clopidogrel (PLAVIX) 75 MG tablet TAKE 1 TABLET (75 MG TOTAL) BY MOUTH DAILY (AM) (Patient taking differently: Take 75 mg by mouth daily.)   diclofenac Sodium (VOLTAREN) 1 % GEL Apply 4 g topically 4 (four) times daily as needed (pain).   ENTRESTO 97-103 MG TAKE 1 TABLET BY MOUTH 2 (TWO) TIMES DAILY (AM+BEDTIME) (Patient taking differently: Take 1 tablet by  mouth 2 (two) times daily.)   Fluticasone-Umeclidin-Vilant (TRELEGY ELLIPTA) 200-62.5-25 MCG/INH AEPB Inhale 1 puff into the lungs daily as needed (wheezing/sob).   nitroGLYCERIN (NITROSTAT) 0.4 MG SL tablet DISSOLVE 1 TABLET UNDER THE TONGUE AS NEEDED AS DIRECTED (Patient taking differently: Place 0.4 mg under the tongue every 5 (five) minutes as needed for chest pain.)   potassium chloride (KLOR-CON) 10 MEQ tablet TAKE 2 TABLETS BY MOUTH 2 (TWO) TIMES DAILY. (AM+BEDTIME)   pregabalin (LYRICA) 100 MG  capsule Take 100 mg by mouth 2 (two) times daily.   REPATHA SURECLICK 253 MG/ML SOAJ INJECT 1 DOSE INTO THE SKIN EVERY 14 (FOURTEEN) DAYS. (Patient taking differently: Inject 140 mg into the skin every 14 (fourteen) days.)   spironolactone (ALDACTONE) 25 MG tablet TAKE 1 TABLET (25 MG TOTAL) BY MOUTH DAILY. (AM) (Patient taking differently: Take 25 mg by mouth daily.)   terconazole (TERAZOL 3) 0.8 % vaginal cream Place 1 applicator vaginally at bedtime. Apply nightly for three nights.   terconazole (TERAZOL 3) 0.8 % vaginal cream Place 1 applicator vaginally at bedtime. Apply nightly for three nights.   triamcinolone cream (KENALOG) 0.1 % Apply 1 application topically 2 (two) times daily.   Vitamin D, Ergocalciferol, (DRISDOL) 1.25 MG (50000 UNIT) CAPS capsule Take 50,000 Units by mouth once a week.   zolpidem (AMBIEN) 10 MG tablet Take 1 tablet (10 mg total) by mouth at bedtime as needed for sleep     Allergies  Allergen Reactions   Coreg [Carvedilol] Other (See Comments)    Headaches/migraine   Strawberry Extract Anaphylaxis, Hives, Swelling and Other (See Comments)    "Fresh strawberries" made the throat swell (2014)   Imdur [Isosorbide Nitrate] Other (See Comments)    Headaches    Amoxicillin Nausea And Vomiting    Did it involve swelling of the face/tongue/throat, SOB, or low BP? Yes Did it involve sudden or severe rash/hives, skin peeling, or any reaction on the inside of your mouth or nose? Yes Did you need to seek medical attention at a hospital or doctor's office? Yes When did it last happen?      within last 10 years If all above answers are "NO", may proceed with cephalosporin use.    Dilaudid [Hydromorphone Hcl] Nausea And Vomiting   Ibuprofen Other (See Comments)    Wheezing    Latex Swelling and Other (See Comments)    No reaction with Band-Aids, though   Metoprolol Other (See Comments)    Headaches and wheezing   Coconut Flavor Hives and Swelling    Throat swelling     Social History   Socioeconomic History   Marital status: Divorced    Spouse name: Not on file   Number of children: 2   Years of education: 12   Highest education level: High school graduate  Occupational History   Not on file  Tobacco Use   Smoking status: Every Day    Packs/day: 0.50    Years: 34.00    Total pack years: 17.00    Types: Cigarettes   Smokeless tobacco: Never  Vaping Use   Vaping Use: Never used  Substance and Sexual Activity   Alcohol use: No    Alcohol/week: 0.0 standard drinks of alcohol    Comment: seldom   Drug use: Yes    Types: Marijuana    Comment: 06/19/19   Sexual activity: Yes    Partners: Male    Birth control/protection: Post-menopausal  Other Topics Concern   Not on file  Social History Narrative   Not on file   Social Determinants of Health   Financial Resource Strain: Medium Risk (03/07/2021)   Overall Financial Resource Strain (CARDIA)    Difficulty of Paying Living Expenses: Somewhat hard  Food Insecurity: Food Insecurity Present (03/07/2021)   Hunger Vital Sign    Worried About Running Out of Food in the Last Year: Sometimes true    Ran Out of Food in the Last Year: Sometimes true  Transportation Needs: Unmet Transportation Needs (03/07/2021)   PRAPARE - Transportation    Lack of Transportation (Medical): No    Lack of Transportation (Non-Medical): Yes  Physical Activity: Inactive (07/25/2019)   Exercise Vital Sign    Days of Exercise per Week: 0 days    Minutes of Exercise per Session: 0 min  Stress: Stress Concern Present (07/25/2019)   [place]    Feeling of Stress : To some extent  Social Connections: Not on file  Intimate Partner Violence: Not on file     Review of Systems: General: negative for chills, fever, night sweats or weight changes.  Cardiovascular: negative for chest pain, dyspnea on exertion, edema, orthopnea, palpitations, paroxysmal  nocturnal dyspnea or shortness of breath Dermatological: negative for rash Respiratory: negative for cough or wheezing Urologic: negative for hematuria Abdominal: negative for nausea, vomiting, diarrhea, bright red blood per rectum, melena, or hematemesis Neurologic: negative for visual changes, syncope, or dizziness All other systems reviewed and are otherwise negative except as noted above.    Blood pressure (!) 138/90, pulse 91, height '5\' 1"'$  (1.549 m), weight 216 lb 12.8 oz (98.3 kg), SpO2 95 %.  General appearance: alert and no distress Neck: no adenopathy, no carotid bruit, no JVD, supple, symmetrical, trachea midline, and thyroid not enlarged, symmetric, no tenderness/mass/nodules Lungs: clear to auscultation bilaterally Heart: regular rate and rhythm, S1, S2 normal, no murmur, click, rub or gallop Extremities: extremities normal, atraumatic, no cyanosis or edema Pulses: 2+ and symmetric Skin: Skin color, texture, turgor normal. No rashes or lesions Neurologic: Grossly normal  EKG sinus rhythm at 91 with low limb voltage and nonspecific ST and T wave changes.  Personally reviewed this EKG.  ASSESSMENT AND PLAN:   Essential hypertension History of essential hypertension blood pressure measured today at 138/90.  She is on Entresto 97/103 but apparently is intolerant to beta-blockers.  Dyslipidemia, goal LDL below 70 History of dyslipidemia on atorvastatin 80 mg a day as well as Repatha with a lipid profile performed 08/06/2021 revealing a total cholesterol of 130, LDL 47 and HDL 42.  CAD S/P multiple PCIs History of CAD status post heart attacks dating back as long as 2004.  She had circumflex stenting by Dr. Einar Gip December about 8 and proximal LAD stenting by Dr. Claiborne Billings 06/11/2011.  I performed cardiac catheterization on her after she was turned down for bypass grafting 06/19/2019.  I placed a 3.5 x 12 mm long stent of the ostium of her LAD and in her proximal LAD as well.  She is  currently asymptomatic.  Acute on chronic combined systolic (congestive) and diastolic (congestive) heart failure ([place]) Her most sent 2D echocardiogram performed 04/30/2022 revealed EF of 50 to 55% up from 25 to 30% in the past.  She is on high-dose Entresto but intolerant to beta blockade.  She is completely asymptomatic.     [name] MD FACP,FACC,FAHA, [facility] 09/29/2022 11:54 AM

## 2022-09-30 ENCOUNTER — Encounter (HOSPITAL_COMMUNITY): Payer: Self-pay | Admitting: *Deleted

## 2022-09-30 ENCOUNTER — Emergency Department (HOSPITAL_COMMUNITY): Payer: Medicaid Other

## 2022-09-30 ENCOUNTER — Emergency Department (HOSPITAL_COMMUNITY)
Admission: EM | Admit: 2022-09-30 | Discharge: 2022-10-01 | Disposition: A | Discharge status: Home / Self Care | Payer: Medicaid Other | Attending: Emergency Medicine | Admitting: Emergency Medicine

## 2022-09-30 ENCOUNTER — Other Ambulatory Visit: Payer: Self-pay

## 2022-09-30 DIAGNOSIS — Z91040 Latex allergy status: Secondary | ICD-10-CM | POA: Diagnosis not present

## 2022-09-30 DIAGNOSIS — M79671 Pain in right foot: Secondary | ICD-10-CM | POA: Insufficient documentation

## 2022-09-30 DIAGNOSIS — Z7902 Long term (current) use of antithrombotics/antiplatelets: Secondary | ICD-10-CM | POA: Insufficient documentation

## 2022-09-30 DIAGNOSIS — M79641 Pain in right hand: Secondary | ICD-10-CM | POA: Diagnosis not present

## 2022-09-30 DIAGNOSIS — M79642 Pain in left hand: Secondary | ICD-10-CM | POA: Insufficient documentation

## 2022-09-30 DIAGNOSIS — Z7982 Long term (current) use of aspirin: Secondary | ICD-10-CM | POA: Insufficient documentation

## 2022-09-30 DIAGNOSIS — I2510 Atherosclerotic heart disease of native coronary artery without angina pectoris: Secondary | ICD-10-CM | POA: Diagnosis not present

## 2022-09-30 LAB — COMPREHENSIVE METABOLIC PANEL
ALT: 12 U/L (ref 0–44)
AST: 13 U/L — ABNORMAL LOW (ref 15–41)
Albumin: 4.4 g/dL (ref 3.5–5.0)
Alkaline Phosphatase: 82 U/L (ref 38–126)
Anion gap: 6 (ref 5–15)
BUN: 23 mg/dL (ref 8–23)
CO2: 25 mmol/L (ref 22–32)
Calcium: 9.5 mg/dL (ref 8.9–10.3)
Chloride: 104 mmol/L (ref 98–111)
Creatinine, Ser: 1.43 mg/dL — ABNORMAL HIGH (ref 0.44–1.00)
GFR, Estimated: 42 mL/min — ABNORMAL LOW (ref >60)
Glucose, Bld: 117 mg/dL — ABNORMAL HIGH (ref 70–99)
Potassium: 4.1 mmol/L (ref 3.5–5.1)
Sodium: 135 mmol/L (ref 135–145)
Total Bilirubin: 0.5 mg/dL (ref 0.3–1.2)
Total Protein: 8.2 g/dL — ABNORMAL HIGH (ref 6.5–8.1)

## 2022-09-30 LAB — CBC WITH DIFFERENTIAL/PLATELET
Abs Immature Granulocytes: 0.03 10*3/uL (ref 0.00–0.07)
Basophils Absolute: 0 10*3/uL (ref 0.0–0.1)
Basophils Relative: 0 %
Eosinophils Absolute: 0.2 10*3/uL (ref 0.0–0.5)
Eosinophils Relative: 3 %
HCT: 40.6 % (ref 36.0–46.0)
Hemoglobin: 13.3 g/dL (ref 12.0–15.0)
Immature Granulocytes: 0 %
Lymphocytes Relative: 25 %
Lymphs Abs: 1.8 10*3/uL (ref 0.7–4.0)
MCH: 28.7 pg (ref 26.0–34.0)
MCHC: 32.8 g/dL (ref 30.0–36.0)
MCV: 87.5 fL (ref 80.0–100.0)
Monocytes Absolute: 0.5 10*3/uL (ref 0.1–1.0)
Monocytes Relative: 7 %
Neutro Abs: 4.5 10*3/uL (ref 1.7–7.7)
Neutrophils Relative %: 65 %
Platelets: 235 10*3/uL (ref 150–400)
RBC: 4.64 MIL/uL (ref 3.87–5.11)
RDW: 17 % — ABNORMAL HIGH (ref 11.5–15.5)
WBC: 7 10*3/uL (ref 4.0–10.5)
nRBC: 0 % (ref 0.0–0.2)

## 2022-09-30 LAB — C-REACTIVE PROTEIN: CRP: 1.1 mg/dL — ABNORMAL HIGH (ref <1.0)

## 2022-09-30 LAB — SEDIMENTATION RATE: Sed Rate: 23 mm/hr — ABNORMAL HIGH (ref 0–22)

## 2022-09-30 LAB — URIC ACID: Uric Acid, Serum: 6.9 mg/dL (ref 2.5–7.1)

## 2022-09-30 NOTE — ED Triage Notes (Signed)
Pt states that she is having pain in the bottom of her right foot at her heel area. She is having swelling in bilateral hands. Symptoms for about 2 weeks.

## 2022-09-30 NOTE — ED Provider Triage Note (Signed)
Emergency Medicine Provider Triage Evaluation Note  Tracey Manning , a 61 y.o. female  was evaluated in triage.  Pt complains of bilateral hand swelling for the past two weeks. She was seen for her left hand swell for the orthopedic and got an injection. She reports It is now in her right hand as well. She reports pain her right foot taht has been radiating up. Denies any fever or dark urine. .  Review of Systems  Positive:  Negative:   Physical Exam  BP (!) 138/97   Pulse 85   Temp 98.4 F (36.9 C) (Oral)   Resp 17   Ht '5\' 1"'$  (1.549 m)   Wt 98 kg   LMP  (LMP Unknown)   SpO2 99%   BMI 40.81 kg/m  Gen:   Awake, no distress   Resp:  Normal effort  MSK:   Moves extremities without difficulty  Other:  Bilateral hand sweling.   Medical Decision Making  Medically screening exam initiated at 8:19 PM.  Appropriate orders placed.  Tracey Manning was informed that the remainder of the evaluation will be completed by another provider, this initial triage assessment does not replace that evaluation, and the importance of remaining in the ED until their evaluation is complete.  Labs and imaging ordered   Sherrell Puller, Hershal Coria 09/30/22 2020

## 2022-10-01 MED ORDER — ACETAMINOPHEN-CODEINE 300-30 MG PO TABS: 1.0000 | ORAL_TABLET | Freq: Once | ORAL | Status: DC

## 2022-10-01 MED ORDER — ACETAMINOPHEN-CODEINE 300-30 MG PO TABS
2.0000 | ORAL_TABLET | Freq: Once | ORAL | Status: AC
  Administered 2022-10-01: 2 via ORAL
  Filled 2022-10-01: qty 2

## 2022-10-01 MED ORDER — ACETAMINOPHEN-CODEINE 300-30 MG PO TABS: 1.0000 | ORAL_TABLET | Freq: Four times a day (QID) | ORAL | 0 refills | Status: AC | PRN

## 2022-10-01 NOTE — Discharge Instructions (Addendum)
You were evaluated in the Emergency Department and after careful evaluation, we did not find any emergent condition requiring admission or further testing in the hospital.  Your x-rays today showed some evidence of arthritis.  I suspect that your lower foot pain may be due to something called planter fasciitis, see the handout for more information and treatment options.  Please take Tylenol 3 as needed, do not take additional Tylenol with this medication.  Please return to the Emergency Department if you experience any worsening of your condition.  We encourage you to follow up with a primary care provider.  Thank you for allowing [place] to be a part of your care.

## 2022-10-01 NOTE — ED Provider Notes (Signed)
[facility] DEPT Provider Note   CSN: [id number] Arrival date & time: 09/30/22  1924     History  Chief Complaint  Patient presents with   Foot Pain    Tracey Manning is a 61 y.o. female.  HPI 61 year old female with a history of CAD, dyslipidemia, MI, collagen vascular disease, sciatica, GERD presents with complaints of bilateral hand swelling and right heel pain.  She reports being seen by a "hand doctor", who gave her steroid injection in her left hand but she felt like that was not very helpful and caused more swelling.  She has also been progressively developing right hand pain.  She has been having difficulty gripping things.  She also complains of pain in her right heel.  No injuries or falls.  She reports that she has been told in the past that she does not have gout.  Denies any fevers or chills.  No chest pain or shortness of breath.  Symptoms of been ongoing for 2 weeks, she states that she is concerned that her pain will not be controlled as she has a cruise coming up in December.  She reports taking Tylenol 3 the past but her prescription has run out and she has not had it refilled.  She has a PCP appointment at the end of the month but has been having such pain that she decided to seek help in the ER.  She has been taking Tylenol with little relief.    Home Medications Prior to Admission medications   Medication Sig Start Date End Date Taking? Authorizing Provider  acetaminophen (TYLENOL) 500 MG tablet Take 1,500 mg by mouth daily as needed (pain).    [provider]  acetaminophen-codeine (TYLENOL #3) 300-30 MG tablet Take 1-2 tablets by mouth every 6 (six) hours as needed for up to 3 days for moderate pain. 10/01/22 10/04/22  Garald Balding, PA-C  albuterol (PROVENTIL HFA) 108 (90 Base) MCG/ACT inhaler Inhale 2 puffs by mouth every 4 hours as needed 05/02/21     albuterol (PROVENTIL) (2.5 MG/3ML) 0.083% nebulizer solution INHALE 1 VIAL  VIA NEBULIZER EVERY 6 HOURS AS NEEDED Patient taking differently: Take 2.5 mg by nebulization every 6 (six) hours as needed for wheezing or shortness of breath. 02/21/21 04/11/23  Nolene Ebbs, MD  aspirin 81 MG EC tablet Take 1 tablet (81 mg total) by mouth daily. 03/12/21   Kroeger, Lorelee Cover., PA-C  atorvastatin (LIPITOR) 80 MG tablet Take 1 tablet (80 mg total) by mouth daily. 05/02/21     cetirizine (ZYRTEC) 10 MG tablet TAKE 1 TABLET BY MOUTH ONCE A DAY AS NEEDED Patient taking differently: Take 10 mg by mouth daily as needed for allergies. 02/21/21 04/11/23  Nolene Ebbs, MD  clopidogrel (PLAVIX) 75 MG tablet TAKE 1 TABLET (75 MG TOTAL) BY MOUTH DAILY (AM) Patient taking differently: Take 75 mg by mouth daily. 02/20/22   Bensimhon, Shaune Pascal, MD  diclofenac Sodium (VOLTAREN) 1 % GEL Apply 4 g topically 4 (four) times daily as needed (pain). 03/24/22   [provider]  ENTRESTO 97-103 MG TAKE 1 TABLET BY MOUTH 2 (TWO) TIMES DAILY (AM+BEDTIME) Patient taking differently: Take 1 tablet by mouth 2 (two) times daily. 04/23/22   Bensimhon, Shaune Pascal, MD  Fluticasone-Umeclidin-Vilant (TRELEGY ELLIPTA) 200-62.5-25 MCG/INH AEPB Inhale 1 puff into the lungs daily as needed (wheezing/sob).    [provider]  nitroGLYCERIN (NITROSTAT) 0.4 MG SL tablet DISSOLVE 1 TABLET UNDER THE TONGUE AS NEEDED AS DIRECTED  Patient taking differently: Place 0.4 mg under the tongue every 5 (five) minutes as needed for chest pain. 03/12/21 04/11/23  Kroeger, Lorelee Cover., PA-C  potassium chloride (KLOR-CON) 10 MEQ tablet TAKE 2 TABLETS BY MOUTH 2 (TWO) TIMES DAILY. (AM+BEDTIME) 09/25/22   Bensimhon, Shaune Pascal, MD  pregabalin (LYRICA) 100 MG capsule Take 100 mg by mouth 2 (two) times daily. 03/24/22   [provider]  REPATHA SURECLICK 937 MG/ML SOAJ INJECT 1 DOSE INTO THE SKIN EVERY 14 (FOURTEEN) DAYS. Patient taking differently: Inject 140 mg into the skin every 14 (fourteen) days. 11/26/21   Hilty, Nadean Corwin, MD   spironolactone (ALDACTONE) 25 MG tablet TAKE 1 TABLET (25 MG TOTAL) BY MOUTH DAILY. (AM) Patient taking differently: Take 25 mg by mouth daily. 04/23/22   Bensimhon, Shaune Pascal, MD  terconazole (TERAZOL 3) 0.8 % vaginal cream Place 1 applicator vaginally at bedtime. Apply nightly for three nights. 08/01/22   Constant, Peggy, MD  terconazole (TERAZOL 3) 0.8 % vaginal cream Place 1 applicator vaginally at bedtime. Apply nightly for three nights. 08/01/22   Constant, Peggy, MD  triamcinolone cream (KENALOG) 0.1 % Apply 1 application topically 2 (two) times daily. 05/27/21   Jaynee Eagles, PA-C  Vitamin D, Ergocalciferol, (DRISDOL) 1.25 MG (50000 UNIT) CAPS capsule Take 50,000 Units by mouth once a week. 03/24/22   [provider]  zolpidem (AMBIEN) 10 MG tablet Take 1 tablet (10 mg total) by mouth at bedtime as needed for sleep 03/20/21         Allergies    Coreg [carvedilol], Strawberry extract, Imdur [isosorbide nitrate], Amoxicillin, Dilaudid [hydromorphone hcl], Ibuprofen, Latex, Metoprolol, and Coconut flavor    Review of Systems   Review of Systems Ten systems reviewed and are negative for acute change, except as noted in the HPI.   Physical Exam Updated Vital Signs BP 139/84 (BP Location: Left Arm)   Pulse 70   Temp (!) 97.5 F (36.4 C) (Oral)   Resp 18   Ht '5\' 1"'$  (1.549 m)   Wt 98 kg   LMP  (LMP Unknown)   SpO2 94%   BMI 40.81 kg/m  Physical Exam Vitals and nursing note reviewed.  Constitutional:      General: She is not in acute distress.    Appearance: She is well-developed.  HENT:     Head: Normocephalic and atraumatic.  Eyes:     Conjunctiva/sclera: Conjunctivae normal.  Cardiovascular:     Rate and Rhythm: Normal rate and regular rhythm.     Heart sounds: No murmur heard. Pulmonary:     Effort: Pulmonary effort is normal. No respiratory distress.     Breath sounds: Normal breath sounds.  Abdominal:     Palpations: Abdomen is soft.     Tenderness: There is no  abdominal tenderness.  Musculoskeletal:        General: Swelling and tenderness present.     Cervical back: Neck supple.     Right lower leg: No edema.     Left lower leg: No edema.     Comments: Right and left wrist and hand with mild swelling, no warmth, no pitting edema, 4/5 grip strength bilaterally, full range of motion of all 5 digits and wrist with flexion and extension.  Tenderness to palpation to the right medial plantar aspect of her right heel, however no visible erythema, no warmth, no fluctuance, no foreign bodies.  Full dorsiflexion and plantarflexion of the right and left ankles, no significant swelling or erythema or  warmth.  2+ DP pulses bilaterally, neurovascularly intact  Skin:    General: Skin is warm and dry.     Capillary Refill: Capillary refill takes less than 2 seconds.  Neurological:     Mental Status: She is alert.  Psychiatric:        Mood and Affect: Mood normal.     ED Results / Procedures / Treatments   Labs (all labs ordered are listed, but only abnormal results are displayed) Labs Reviewed  SEDIMENTATION RATE - Abnormal; Notable for the following components:      Result Value   Sed Rate 23 (*)    All other components within normal limits  C-REACTIVE PROTEIN - Abnormal; Notable for the following components:   CRP 1.1 (*)    All other components within normal limits  CBC WITH DIFFERENTIAL/PLATELET - Abnormal; Notable for the following components:   RDW 17.0 (*)    All other components within normal limits  COMPREHENSIVE METABOLIC PANEL - Abnormal; Notable for the following components:   Glucose, Bld 117 (*)    Creatinine, Ser 1.43 (*)    Total Protein 8.2 (*)    AST 13 (*)    GFR, Estimated 42 (*)    All other components within normal limits  URIC ACID    EKG None  Radiology DG Hand Complete Left  Result Date: 09/30/2022 CLINICAL DATA:  Swelling in both hands EXAM: LEFT HAND - COMPLETE 3+ VIEW COMPARISON:  None Available. FINDINGS: No  acute fracture or dislocation. Scattered degenerative arthritis greatest about the IP joints and first CMC joints where it is mild. Soft tissue swelling about the dorsum of the wrist. IMPRESSION: No acute osseous abnormality. Soft tissue swelling about the dorsum of the wrist. Electronically Signed   By: Placido Sou M.D.   On: 09/30/2022 21:22   DG Foot Complete Right  Result Date: 09/30/2022 CLINICAL DATA:  Swelling in the right foot EXAM: RIGHT FOOT COMPLETE - 3+ VIEW COMPARISON:  None Available. FINDINGS: No acute fracture or dislocation. Degenerative arthritis about the midfoot. Plantar calcaneal spur. Mild swelling about the ankle. IMPRESSION: No acute osseous abnormality.  Mild swelling about the ankle. Electronically Signed   By: Placido Sou M.D.   On: 09/30/2022 21:20   DG Hand Complete Right  Result Date: 09/30/2022 CLINICAL DATA:  Swelling in both hands and right foot for 2 weeks. EXAM: RIGHT HAND - COMPLETE 3+ VIEW COMPARISON:  None Available. FINDINGS: There is no evidence of fracture or dislocation. Degenerative changes are present at the interphalangeal and metacarpal phalangeal joints. Soft tissues are unremarkable. IMPRESSION: Degenerative changes at the interphalangeal and metacarpal phalangeal joints. Electronically Signed   By: Brett Fairy M.D.   On: 09/30/2022 21:15    Procedures Procedures    Medications Ordered in ED Medications  acetaminophen-codeine (TYLENOL #3) 300-30 MG per tablet 2 tablet (has no administration in time range)    ED Course/ Medical Decision Making/ A&P                           Medical Decision Making Risk Prescription drug management.   61 year old female presenting with complaints of bilateral hand/wrist pain and right foot pain.  On arrival, vitals reassuring.  She does have some mild swelling to bilateral hands globally, no erythema, warmth, full range of motion, with slightly decreased grip strength bilaterally.  Ordered in triage,  reviewed.  She has been followed by hand specialist and has been  getting worked up for her pain.  She has had nerve conduction studies and steroid injections.  X-rays ordered in triage, reviewed, agree with radiology read.  Right hand with degenerative changes, left hand with soft tissue swelling of the dorsum of the wrist but no other findings noted.  Left wrist with no overlying warmth, full flexion, low suspicion for gout or septic joint.  X-rays of the right ankle note mild swelling of the ankle but again no significant effusion noted, no overlying warmth, and full flexion extension of the ankle low suspicion for gout or septic joint.  No evidence of cellulitis on exam to the hands or the lower extremity.  Given the tenderness to palpation to the plantar aspect of the right foot, I do question if this is planter fasciitis.  Discussed that hand pain and swelling could be potentially due to arthritis.  Labs ordered in triage, reviewed by me, CMP with a creatinine of 1.43 which appears to be at baseline, CBC without leukocytosis, barely elevated CRP at 1.1 and a sed rate of 23, normal uric acid.  Again, low suspicion for gout or septic joint based on exam.  No evidence of heart failure exacerbation.  Patient was requesting refill of Tylenol 3, explained that I can only give her several days worth which she is agreeable to.  BMP reviewed, appropriate.  I recommended that she continue to follow-up with her PCP and hand specialist.  We discussed return precautions.  She voiced her standing and is agreeable.  Stable for discharge.   final Clinical Impression(s) / ED Diagnoses Final diagnoses:  Foot pain, right  Pain in both hands    Rx / DC Orders ED Discharge Orders          Ordered    acetaminophen-codeine (TYLENOL #3) 300-30 MG tablet  Every 6 hours PRN        10/01/22 0221              Garald Balding, PA-C 10/01/22 0226    Orpah Greek, MD 10/01/22 [phone]

## 2022-11-25 ENCOUNTER — Emergency Department (HOSPITAL_COMMUNITY): Payer: Medicaid Other

## 2022-11-25 ENCOUNTER — Emergency Department (HOSPITAL_COMMUNITY)
Admission: EM | Admit: 2022-11-25 | Discharge: 2022-11-26 | Disposition: A | Discharge status: Home / Self Care | Payer: Medicaid Other | Attending: Emergency Medicine | Admitting: Emergency Medicine

## 2022-11-25 DIAGNOSIS — Z79899 Other long term (current) drug therapy: Secondary | ICD-10-CM | POA: Diagnosis not present

## 2022-11-25 DIAGNOSIS — N189 Chronic kidney disease, unspecified: Secondary | ICD-10-CM | POA: Insufficient documentation

## 2022-11-25 DIAGNOSIS — Z7902 Long term (current) use of antithrombotics/antiplatelets: Secondary | ICD-10-CM | POA: Insufficient documentation

## 2022-11-25 DIAGNOSIS — U071 COVID-19: Secondary | ICD-10-CM | POA: Diagnosis not present

## 2022-11-25 DIAGNOSIS — I2510 Atherosclerotic heart disease of native coronary artery without angina pectoris: Secondary | ICD-10-CM | POA: Diagnosis not present

## 2022-11-25 DIAGNOSIS — Z91040 Latex allergy status: Secondary | ICD-10-CM | POA: Insufficient documentation

## 2022-11-25 DIAGNOSIS — E876 Hypokalemia: Secondary | ICD-10-CM | POA: Diagnosis not present

## 2022-11-25 DIAGNOSIS — I130 Hypertensive heart and chronic kidney disease with heart failure and stage 1 through stage 4 chronic kidney disease, or unspecified chronic kidney disease: Secondary | ICD-10-CM | POA: Insufficient documentation

## 2022-11-25 DIAGNOSIS — I509 Heart failure, unspecified: Secondary | ICD-10-CM | POA: Insufficient documentation

## 2022-11-25 DIAGNOSIS — Z7982 Long term (current) use of aspirin: Secondary | ICD-10-CM | POA: Insufficient documentation

## 2022-11-25 DIAGNOSIS — R079 Chest pain, unspecified: Secondary | ICD-10-CM | POA: Diagnosis present

## 2022-11-25 LAB — BASIC METABOLIC PANEL
Anion gap: 13 (ref 5–15)
BUN: 12 mg/dL (ref 8–23)
CO2: 24 mmol/L (ref 22–32)
Calcium: 9 mg/dL (ref 8.9–10.3)
Chloride: 101 mmol/L (ref 98–111)
Creatinine, Ser: 1.19 mg/dL — ABNORMAL HIGH (ref 0.44–1.00)
GFR, Estimated: 52 mL/min — ABNORMAL LOW (ref >60)
Glucose, Bld: 114 mg/dL — ABNORMAL HIGH (ref 70–99)
Potassium: 3.1 mmol/L — ABNORMAL LOW (ref 3.5–5.1)
Sodium: 138 mmol/L (ref 135–145)

## 2022-11-25 LAB — CBC
HCT: 37.9 % (ref 36.0–46.0)
Hemoglobin: 12.4 g/dL (ref 12.0–15.0)
MCH: 28.6 pg (ref 26.0–34.0)
MCHC: 32.7 g/dL (ref 30.0–36.0)
MCV: 87.3 fL (ref 80.0–100.0)
Platelets: 178 10*3/uL (ref 150–400)
RBC: 4.34 MIL/uL (ref 3.87–5.11)
RDW: 16.5 % — ABNORMAL HIGH (ref 11.5–15.5)
WBC: 5.8 10*3/uL (ref 4.0–10.5)
nRBC: 0 % (ref 0.0–0.2)

## 2022-11-25 LAB — TROPONIN I (HIGH SENSITIVITY)
Troponin I (High Sensitivity): 5 ng/L (ref <18)
Troponin I (High Sensitivity): 6 ng/L (ref <18)

## 2022-11-25 LAB — RESP PANEL BY RT-PCR (RSV, FLU A&B, COVID)  RVPGX2
Influenza A by PCR: NEGATIVE
Influenza B by PCR: NEGATIVE
Resp Syncytial Virus by PCR: NEGATIVE
SARS Coronavirus 2 by RT PCR: POSITIVE — AB

## 2022-11-25 MED ORDER — ACETAMINOPHEN 325 MG PO TABS: 650.0000 mg | ORAL_TABLET | Freq: Once | ORAL | Status: DC

## 2022-11-25 NOTE — ED Provider Triage Note (Signed)
Emergency Medicine Provider Triage Evaluation Note  Tracey Manning , a 62 y.o. female  was evaluated in triage.  Pt complains of chest pain, cough. Patient reports that she recently came off a cruise and began to develop upper respiratory symptoms.  She is concerned that her cough is become so severe it is exacerbating her asthma and has tried taking albuterol.  Denies nausea, diarrhea, vomiting.  Review of Systems  Positive: As above Negative: As above  Physical Exam  BP (!) 122/99 (BP Location: Left Arm)   Pulse 78   Resp 16   LMP  (LMP Unknown)   SpO2 100%  Gen:   Awake, uncomfortable Resp:  Normal effort, wheezing throughout lung fields MSK:   Moves extremities without difficulty  Other:    Medical Decision Making  Medically screening exam initiated at 4:01 PM.  Appropriate orders placed.  Tracey Manning was informed that the remainder of the evaluation will be completed by another provider, this initial triage assessment does not replace that evaluation, and the importance of remaining in the ED until their evaluation is complete.     Tracey Heller, PA-C 11/25/22 [phone]

## 2022-11-25 NOTE — ED Triage Notes (Signed)
Patietn BIB [facility] from home for evaluation of a cough that started three days ago and chest pain that started today. Chest pain radiates to right chest. Pain is describes as sharp and is exacerbated by movement. Patient denies shortness of breath, but EMS gave 2x duoneb en route for wheezing, patient states chest pain improved after administration of duoneb. 20g saline lock in right AC. Patient received '324mg'$  ASA. Patient is alert, oriented, and in no apparent distress at this time.  BP 141/94 HR 90 SpO2 96% on room air CBG 126 RR 22

## 2022-11-26 NOTE — ED Provider Notes (Signed)
[facility] Provider Note   CSN: [id number] Arrival date & time: 11/25/22  1517     History  Chief Complaint  Patient presents with   Chest Pain    Tracey Manning is a 62 y.o. female who presents with concern for myalgias cough, chest wall pain, body aches and subjective fevers that started 5 days ago after being on a cruise and exposed to other patients with the same symptoms. Has been using her albuterol inhalers at home with improvement.   I have reviewed her medical records. She has hx of HTN, CAD, COD, CHF, CKD.   HPI     Home Medications Prior to Admission medications   Medication Sig Start Date End Date Taking? Authorizing Provider  acetaminophen (TYLENOL) 500 MG tablet Take 1,500 mg by mouth daily as needed (pain).    [provider]  albuterol (PROVENTIL HFA) 108 (90 Base) MCG/ACT inhaler Inhale 2 puffs by mouth every 4 hours as needed 05/02/21     albuterol (PROVENTIL) (2.5 MG/3ML) 0.083% nebulizer solution INHALE 1 VIAL VIA NEBULIZER EVERY 6 HOURS AS NEEDED Patient taking differently: Take 2.5 mg by nebulization every 6 (six) hours as needed for wheezing or shortness of breath. 02/21/21 04/11/23  Nolene Ebbs, MD  aspirin 81 MG EC tablet Take 1 tablet (81 mg total) by mouth daily. 03/12/21   Kroeger, Lorelee Cover., PA-C  atorvastatin (LIPITOR) 80 MG tablet Take 1 tablet (80 mg total) by mouth daily. 05/02/21     cetirizine (ZYRTEC) 10 MG tablet TAKE 1 TABLET BY MOUTH ONCE A DAY AS NEEDED Patient taking differently: Take 10 mg by mouth daily as needed for allergies. 02/21/21 04/11/23  Nolene Ebbs, MD  clopidogrel (PLAVIX) 75 MG tablet TAKE 1 TABLET (75 MG TOTAL) BY MOUTH DAILY (AM) Patient taking differently: Take 75 mg by mouth daily. 02/20/22   Bensimhon, Shaune Pascal, MD  diclofenac Sodium (VOLTAREN) 1 % GEL Apply 4 g topically 4 (four) times daily as needed (pain). 03/24/22   [provider]  ENTRESTO 97-103 MG TAKE 1 TABLET  BY MOUTH 2 (TWO) TIMES DAILY (AM+BEDTIME) Patient taking differently: Take 1 tablet by mouth 2 (two) times daily. 04/23/22   Bensimhon, Shaune Pascal, MD  Fluticasone-Umeclidin-Vilant (TRELEGY ELLIPTA) 200-62.5-25 MCG/INH AEPB Inhale 1 puff into the lungs daily as needed (wheezing/sob).    [provider]  nitroGLYCERIN (NITROSTAT) 0.4 MG SL tablet DISSOLVE 1 TABLET UNDER THE TONGUE AS NEEDED AS DIRECTED Patient taking differently: Place 0.4 mg under the tongue every 5 (five) minutes as needed for chest pain. 03/12/21 04/11/23  Kroeger, Lorelee Cover., PA-C  potassium chloride (KLOR-CON) 10 MEQ tablet TAKE 2 TABLETS BY MOUTH 2 (TWO) TIMES DAILY. (AM+BEDTIME) 09/25/22   Bensimhon, Shaune Pascal, MD  pregabalin (LYRICA) 100 MG capsule Take 100 mg by mouth 2 (two) times daily. 03/24/22   [provider]  REPATHA SURECLICK 629 MG/ML SOAJ INJECT 1 DOSE INTO THE SKIN EVERY 14 (FOURTEEN) DAYS. Patient taking differently: Inject 140 mg into the skin every 14 (fourteen) days. 11/26/21   Hilty, Nadean Corwin, MD  spironolactone (ALDACTONE) 25 MG tablet TAKE 1 TABLET (25 MG TOTAL) BY MOUTH DAILY. (AM) Patient taking differently: Take 25 mg by mouth daily. 04/23/22   Bensimhon, Shaune Pascal, MD  terconazole (TERAZOL 3) 0.8 % vaginal cream Place 1 applicator vaginally at bedtime. Apply nightly for three nights. 08/01/22   Constant, Peggy, MD  terconazole (TERAZOL 3) 0.8 % vaginal cream Place 1 applicator vaginally at  bedtime. Apply nightly for three nights. 08/01/22   Constant, Peggy, MD  triamcinolone cream (KENALOG) 0.1 % Apply 1 application topically 2 (two) times daily. 05/27/21   Jaynee Eagles, PA-C  Vitamin D, Ergocalciferol, (DRISDOL) 1.25 MG (50000 UNIT) CAPS capsule Take 50,000 Units by mouth once a week. 03/24/22   [provider]  zolpidem (AMBIEN) 10 MG tablet Take 1 tablet (10 mg total) by mouth at bedtime as needed for sleep 03/20/21         Allergies    Coreg [carvedilol], Strawberry extract, Imdur [isosorbide  nitrate], Amoxicillin, Dilaudid [hydromorphone hcl], Ibuprofen, Latex, Metoprolol, and Coconut flavor    Review of Systems   Review of Systems  Constitutional:  Positive for appetite change, chills, fatigue and fever.  HENT:  Positive for congestion, rhinorrhea and sinus pressure.   Respiratory:  Positive for cough.   Musculoskeletal:  Positive for myalgias.    Physical Exam Updated Vital Signs BP 127/84 (BP Location: Right Arm)   Pulse 72   Temp 97.6 F (36.4 C)   Resp (!) 24   LMP  (LMP Unknown)   SpO2 95%  Physical Exam Vitals and nursing note reviewed.  Constitutional:      Appearance: She is obese. She is not ill-appearing or toxic-appearing.  HENT:     Head: Normocephalic and atraumatic.     Nose: Congestion and rhinorrhea present.     Mouth/Throat:     Mouth: Mucous membranes are moist.     Pharynx: No oropharyngeal exudate or posterior oropharyngeal erythema.  Eyes:     General:        Right eye: No discharge.        Left eye: No discharge.     Conjunctiva/sclera: Conjunctivae normal.  Cardiovascular:     Rate and Rhythm: Normal rate and regular rhythm.     Pulses: Normal pulses.     Heart sounds: Normal heart sounds. No murmur heard. Pulmonary:     Effort: Pulmonary effort is normal. No respiratory distress.     Breath sounds: Normal breath sounds. No wheezing or rales.  Chest:     Chest wall: No mass, tenderness or edema.  Abdominal:     General: Bowel sounds are normal. There is no distension.     Palpations: Abdomen is soft.     Tenderness: There is no abdominal tenderness.  Musculoskeletal:        General: No deformity.     Cervical back: Normal range of motion and neck supple.     Right lower leg: No edema.     Left lower leg: No edema.  Skin:    General: Skin is warm and dry.     Capillary Refill: Capillary refill takes less than 2 seconds.  Neurological:     General: No focal deficit present.     Mental Status: She is alert and oriented to  person, place, and time. Mental status is at baseline.  Psychiatric:        Mood and Affect: Mood normal.     ED Results / Procedures / Treatments   Labs (all labs ordered are listed, but only abnormal results are displayed) Labs Reviewed  RESP PANEL BY RT-PCR (RSV, FLU A&B, COVID)  RVPGX2 - Abnormal; Notable for the following components:      Result Value   SARS Coronavirus 2 by RT PCR POSITIVE (*)    All other components within normal limits  BASIC METABOLIC PANEL - Abnormal; Notable for the following components:  Potassium 3.1 (*)    Glucose, Bld 114 (*)    Creatinine, Ser 1.19 (*)    GFR, Estimated 52 (*)    All other components within normal limits  CBC - Abnormal; Notable for the following components:   RDW 16.5 (*)    All other components within normal limits  TROPONIN I (HIGH SENSITIVITY)  TROPONIN I (HIGH SENSITIVITY)    EKG None  Radiology DG Chest 2 View  Result Date: 11/25/2022 CLINICAL DATA:  Chest pain, cough EXAM: CHEST - 2 VIEW COMPARISON:  11/02/2021 FINDINGS: Cardiac and mediastinal contours are within normal limits given AP technique. Bibasilar opacities, most likely atelectasis. No pleural effusion or pneumothorax. No acute osseous abnormality. IMPRESSION: No acute cardiopulmonary process. Electronically Signed   By: Merilyn Baba M.D.   On: 11/25/2022 16:28    Procedures Procedures   Medications Ordered in ED Medications  acetaminophen (TYLENOL) tablet 650 mg (has no administration in time range)    ED Course/ Medical Decision Making/ A&P                           Medical Decision Making 62  year old female with flu-like symptoms after getting off of a cruise.   HTN on intake, VS otherwise normal. Cardiopulmonary exam is unremarkable, abdominal exam is benign.   DDX includes but is not limited to viral URI, pneumonia, COPD exacerbation, ACS.Marland Kitchen   Amount and/or Complexity of Data Reviewed Labs: ordered.    Details: CBC without laxatives or  anemia, CMP with hypokalemia of 3.1, creatinine 1.1 at baseline.  Troponin negative x 2 6 and then 5.  RVP positive for COVID-19.   Radiology: ordered.    Details: Chest x-ray visualized this provider agrees radiology to rotation, no acute cardiopulmonary disease. ECG/medicine tests:     Details:   EKG with sinus rhythm without STEMI or interval changes.    Clinical picture most consistent with acute viral illness diagnosed today with COVID-19 in the emergency department.  Clinical concern for emergent underlying etiology of her symptoms that would warrant further ED workup or inpatient management is exceedingly low.  Tracey Manning voiced understanding of her medical evaluation and treatment plan. Each of their questions answered to their expressed satisfaction.  Return precautions were given.  Patient is well-appearing, stable, and was discharged in good condition.  This chart was dictated using voice recognition software, Dragon. Despite the best efforts of this provider to proofread and correct errors, errors may still occur which can change documentation meaning.    Final Clinical Impression(s) / ED Diagnoses Final diagnoses:  [id number]    Rx / DC Orders ED Discharge Orders     None         Aura Dials 11/26/22 0330    Quintella Reichert, MD 11/26/22 [phone]

## 2022-11-26 NOTE — ED Triage Notes (Signed)
No answer for 2nd trop-TH

## 2022-11-26 NOTE — Discharge Instructions (Addendum)
You are seen in the ER today for chest pain or cough.  You have COVID-19.  Your blood work and EKGs were reassuring.  You may use your inhalers and nebulizers at home as needed and follow-up with your primary care doctor.  Return to the ER with any severe symptoms.

## 2022-11-27 ENCOUNTER — Other Ambulatory Visit (HOSPITAL_COMMUNITY): Payer: Self-pay

## 2022-12-18 ENCOUNTER — Other Ambulatory Visit: Payer: Self-pay | Admitting: Internal Medicine

## 2022-12-19 LAB — CBC
HCT: 37.2 % (ref 35.0–45.0)
Hemoglobin: 13 g/dL (ref 11.7–15.5)
MCH: 28.8 pg (ref 27.0–33.0)
MCHC: 34.9 g/dL (ref 32.0–36.0)
MCV: 82.3 fL (ref 80.0–100.0)
MPV: 9.3 fL (ref 7.5–12.5)
Platelets: 282 10*3/uL (ref 140–400)
RBC: 4.52 10*6/uL (ref 3.80–5.10)
RDW: 15.9 % — ABNORMAL HIGH (ref 11.0–15.0)
WBC: 6.6 10*3/uL (ref 3.8–10.8)

## 2022-12-19 LAB — LIPID PANEL
Cholesterol: 120 mg/dL (ref <200)
HDL: 40 mg/dL — ABNORMAL LOW (ref >=50)
LDL Cholesterol (Calc): 57 mg/dL (calc)
Non-HDL Cholesterol (Calc): 80 mg/dL (calc) (ref <130)
Total CHOL/HDL Ratio: 3 (calc) (ref <5.0)
Triglycerides: 148 mg/dL (ref <150)

## 2022-12-19 LAB — COMPLETE METABOLIC PANEL WITH GFR
AG Ratio: 1.4 (calc) (ref 1.0–2.5)
ALT: 12 U/L (ref 6–29)
AST: 11 U/L (ref 10–35)
Albumin: 4.4 g/dL (ref 3.6–5.1)
Alkaline phosphatase (APISO): 96 U/L (ref 37–153)
BUN/Creatinine Ratio: 14 (calc) (ref 6–22)
BUN: 15 mg/dL (ref 7–25)
CO2: 22 mmol/L (ref 20–32)
Calcium: 9.6 mg/dL (ref 8.6–10.4)
Chloride: 107 mmol/L (ref 98–110)
Creat: 1.06 mg/dL — ABNORMAL HIGH (ref 0.50–1.05)
Globulin: 3.1 g/dL (calc) (ref 1.9–3.7)
Glucose, Bld: 87 mg/dL (ref 65–99)
Potassium: 4.5 mmol/L (ref 3.5–5.3)
Sodium: 142 mmol/L (ref 135–146)
Total Bilirubin: 0.3 mg/dL (ref 0.2–1.2)
Total Protein: 7.5 g/dL (ref 6.1–8.1)
eGFR: 60 mL/min/{1.73_m2} (ref >=60)

## 2022-12-19 LAB — VITAMIN D 25 HYDROXY (VIT D DEFICIENCY, FRACTURES): Vit D, 25-Hydroxy: 115 ng/mL — ABNORMAL HIGH (ref 30–100)

## 2022-12-19 LAB — TSH: TSH: 1.5 mIU/L (ref 0.40–4.50)

## 2023-02-25 ENCOUNTER — Other Ambulatory Visit (HOSPITAL_COMMUNITY): Payer: Self-pay | Admitting: Internal Medicine

## 2023-03-29 ENCOUNTER — Other Ambulatory Visit (HOSPITAL_COMMUNITY): Payer: Self-pay | Admitting: Internal Medicine

## 2023-04-22 ENCOUNTER — Other Ambulatory Visit (HOSPITAL_COMMUNITY): Payer: Self-pay

## 2023-04-26 ENCOUNTER — Telehealth: Payer: Self-pay | Admitting: Internal Medicine

## 2023-04-26 NOTE — Telephone Encounter (Signed)
PA for repatha submitted via [facility] Request Reference Number: [id number]. REPATHA SURE INJ 140MG /ML is approved through 04/25/2024.

## 2023-05-24 ENCOUNTER — Other Ambulatory Visit (HOSPITAL_COMMUNITY): Payer: Self-pay | Admitting: Internal Medicine

## 2023-06-04 ENCOUNTER — Ambulatory Visit: Payer: Medicaid Other | Attending: Nurse Practitioner | Admitting: Nurse Practitioner

## 2023-06-04 ENCOUNTER — Encounter: Payer: Self-pay | Admitting: Nurse Practitioner

## 2023-06-04 VITALS — BP 110/70 | HR 56 | Ht 61.0 in | Wt 217.8 lb

## 2023-06-04 DIAGNOSIS — I4729 Other ventricular tachycardia: Secondary | ICD-10-CM

## 2023-06-04 DIAGNOSIS — I5022 Chronic systolic (congestive) heart failure: Secondary | ICD-10-CM | POA: Diagnosis not present

## 2023-06-04 DIAGNOSIS — I493 Ventricular premature depolarization: Secondary | ICD-10-CM

## 2023-06-04 DIAGNOSIS — Z720 Tobacco use: Secondary | ICD-10-CM

## 2023-06-04 DIAGNOSIS — E66813 Obesity, class 3: Secondary | ICD-10-CM

## 2023-06-04 DIAGNOSIS — I255 Ischemic cardiomyopathy: Secondary | ICD-10-CM

## 2023-06-04 DIAGNOSIS — Z6841 Body Mass Index (BMI) 40.0 and over, adult: Secondary | ICD-10-CM

## 2023-06-04 DIAGNOSIS — J449 Chronic obstructive pulmonary disease, unspecified: Secondary | ICD-10-CM

## 2023-06-04 DIAGNOSIS — I2510 Atherosclerotic heart disease of native coronary artery without angina pectoris: Secondary | ICD-10-CM | POA: Diagnosis not present

## 2023-06-04 DIAGNOSIS — I471 Supraventricular tachycardia, unspecified: Secondary | ICD-10-CM

## 2023-06-04 DIAGNOSIS — E785 Hyperlipidemia, unspecified: Secondary | ICD-10-CM | POA: Diagnosis not present

## 2023-06-04 DIAGNOSIS — I10 Essential (primary) hypertension: Secondary | ICD-10-CM

## 2023-06-04 NOTE — Progress Notes (Signed)
Office Visit    Patient Name: Tracey Manning Date of Encounter: 06/04/2023  Primary Care Provider:  Fleet Contras, MD Primary Cardiologist:  Nanetta Batty, MD  Chief Complaint    62 year old female with a history of CAD s/p multiple PCIs, chronic systolic heart failure, ICM, PVCs, hypertension, hyperlipidemia, OSA (intolerant to CPAP), prediabetes, COPD, tobacco use, GERD, anxiety, and depression who presents for follow-up related to CAD and heart failure.  Past Medical History    Past Medical History:  Diagnosis Date   Anxiety    Arthritis    Asthma    Barrett's esophagus    Chest pain    Collagen vascular disease (HCC)    Colon polyps    Coronary artery disease    Depression    Dyslipidemia    GERD (gastroesophageal reflux disease)    occ   Hyperlipidemia    Hypertension    Myocardial infarct (HCC) [id number]   Sciatica    Sleep apnea    no CPAP ordered but using oxygen at bedtime   Tobacco abuse    Past Surgical History:  Procedure Laterality Date   CARDIAC CATHETERIZATION  10/09/2003   [facility] [place], [place]) - LAD with 30% prox narrowing, 50% stenosis in mid-portion of PLA; RCA with 40% narrowing proximally (Dr. Nicholes Calamity, [name])   CARDIAC CATHETERIZATION  10/10/2007   70% stenosis in first septal perforator branch of LAD, 60-70% narrowing in mid LAD, 20% narrowing in mid AV groove Cfx with 80% diffuse narrowing in small distal marginal, total occlusion of mid RCA with L to R collaterals, 90% stenosis diffusely in prox branch of RCA followed by 70% stenosis in secondary curve & 80% stenosis in small marginal branch (Dr. Bishop Limbo)   CARDIAC CATHETERIZATION  05/28/2008   normal L main, RCA with 100% prox lesion w/distal filling from LAD collaterals, LAD with 20% prox tubular lesion/ 40% mid LAD lesion/previous stent patent (Dr. Evlyn Courier)   CARDIAC CATHETERIZATION  09/15/2009   discrete 100% osital RCA lesion, 50% prox LAD lesion, non-obstructive disease  in all coronaries (Dr. Claudia Desanctis)   CESAREAN SECTION     COLONOSCOPY     COLONOSCOPY W/ POLYPECTOMY     CORONARY ANGIOPLASTY WITH STENT PLACEMENT  10/31/2007   PCI of distal Cfx with 2.25x51mm Taxus Adam DES, 60% narrowing of mid LAD (Dr. Bishop Limbo)   CORONARY ANGIOPLASTY WITH STENT PLACEMENT  06/11/2011   PCI of prox-mid LAD with 3x38mm DES Resolute (Dr. Bishop Limbo)   CORONARY STENT INTERVENTION N/A 06/19/2019   Procedure: CORONARY STENT INTERVENTION;  Surgeon: Runell Gess, MD;  Location: [facility];  Service: Cardiovascular;  Laterality: N/A;  LAD   DILATION AND CURETTAGE, DIAGNOSTIC / THERAPEUTIC     DILITATION & CURRETTAGE/HYSTROSCOPY WITH HYDROTHERMAL ABLATION N/A 04/03/2016   Procedure: DILATATION & CURETTAGE/HYSTEROSCOPY WITH HYDROTHERMAL ABLATION;  Surgeon: Brock Bad, MD;  Location: [facility];  Service: Gynecology;  Laterality: N/A;   NM MYOCAR PERF WALL MOTION  08/2009   persantine myoview - normal perfusion in all regions, perfusion defect in anterior region (breast attenuation), EF 52%, low risk scan   RIGHT/LEFT HEART CATH AND CORONARY ANGIOGRAPHY N/A 06/15/2019   Procedure: RIGHT/LEFT HEART CATH AND CORONARY ANGIOGRAPHY;  Surgeon: Lyn Records, MD;  Location: [facility];  Service: Cardiovascular;  Laterality: N/A;    Allergies  Allergies  Allergen Reactions   Coreg [Carvedilol] Other (See Comments)    Headaches/migraine   Strawberry Extract Anaphylaxis, Hives,  Swelling and Other (See Comments)    "Fresh strawberries" made the throat swell (2014)   Imdur [Isosorbide Nitrate] Other (See Comments)    Headaches    Amoxicillin Nausea And Vomiting    Did it involve swelling of the face/tongue/throat, SOB, or low BP? Yes Did it involve sudden or severe rash/hives, skin peeling, or any reaction on the inside of your mouth or nose? Yes Did you need to seek medical attention at a hospital or doctor's office? Yes When did it last happen?      within last 10  years If all above answers are "NO", may proceed with cephalosporin use.    Dilaudid [Hydromorphone Hcl] Nausea And Vomiting   Ibuprofen Other (See Comments)    Wheezing    Latex Swelling and Other (See Comments)    No reaction with Band-Aids, though   Metoprolol Other (See Comments)    Headaches and wheezing   Coconut Flavor Hives and Swelling    Throat swelling     Labs/Other Studies Reviewed    The following studies were reviewed today:  Cardiac Studies & Procedures   CARDIAC CATHETERIZATION  CARDIAC CATHETERIZATION 06/19/2019  Narrative Images from the original result were not included.   Ost RCA to Prox RCA lesion is 100% stenosed.  Previously placed Prox Cx to Mid Cx stent (unknown type) is widely patent.  Previously placed Prox LAD to Mid LAD stent (unknown type) is widely patent.  Ost LAD lesion is 90% stenosed.  Prox LAD lesion is 90% stenosed.  A stent was successfully placed.  Post intervention, there is a 0% residual stenosis.  A stent was successfully placed.  Post intervention, there is a 0% residual stenosis.  Tracey Manning is a 62 y.o. female   [id number] LOCATION:  FACILITY: [facility] PHYSICIAN: Nanetta Batty, M.D. [birth year]   DATE OF PROCEDURE:  06/19/2019  DATE OF DISCHARGE:     CARDIAC CATHETERIZATION / LAD DES LAD    History obtained from chart review.62 y.o. female with PMH of CAD, tobacco abuse, HTN, and HLD presented with SOB/asthma and found to have new cardiomyopathy. Cath 7/24 showed occluded RCA with severe stenosis of OM and prox LAD.  She was admitted last week with chest pain and shortness of breath.  Her EF was newly depressed and 25 to 30% range.  Dr. Katrinka Blazing performed cardiac catheterization on 06/15/2019 revealing an occluded RCA, patent stent in the mid circumflex and proximal LAD and high-grade proximal LAD stenosis.  Surgical consult was obtained and she was turned down for CABG.  Her medications were optimized and she  was diuresed.  She presents now for proximal LAD intervention.   PROCEDURE DESCRIPTION:  The patient was brought to the second floor [facility] cath lab in the postabsorptive state.  She was premedicated with IV Versed and fentanyl.  Her right wristwas prepped and shaved in usual sterile fashion. Xylocaine 1% was used for local anesthesia. A 6 French sheath was inserted into the right radial artery using standard Seldinger technique. The patient received 10,000 units  of heparin intravenously.  A 6 [place] XB LAD 3.5 cm guide catheter along with a 0.14 pro-water guidewire x2 (buddy wire technique) and a 2 mm x 12 mm balloon were used to predilate the proximal LAD.  The patient was loaded with Plavix 600 mg p.o.  Omnipaque dye was used for the entirety of the intervention.  Retroaortic pressure was monitored during the case.  The ACT was demonstrated to greater than 300.  The stent was advanced to the diseased segment with great difficulty requiring buddy wire technique.  Ultimately, a 3 mm x 12 mm long Synergy drug-eluting stent was then carefully positioned across the predilated proximal segment and deployed at 16 atm.  It was postdilated with a 3.25 x 12 mm balloon up to 16 atm.  The patient developed chest pain after this and angiography in different views revealed a high-grade ostial LAD lesion that was not appreciated and prior angiograms.  After consultation with Dr. Tresa Endo it was decided to stent this with a 3.5 x 12 mm long Synergy drug-eluting stent at the origin of the left main up to 16 atm (3.60 mm).  I did attempt to postdilated 3.75 x 8 mm balloon but was unable to cross the ostium of the LAD because of wire bias.  The guidewire and catheter were removed.  The sheath was removed and a TR band was placed on the right wrist to achieve patent hemostasis.  The patient was markedly improved at the end of the case.  She left lab in stable condition.  Impression Successful ostial/proximal  LAD stenting in the setting of severe LV dysfunction and an occluded RCA with left-to-right collaterals after having been turned down for CABG.  Patient will be gently hydrated overnight and discharged home in the morning on DAPT.  Nanetta Batty. MD, [facility] 06/19/2019 12:41 PM  Findings Coronary Findings Diagnostic  Dominance: Right  Left Anterior Descending Ost LAD lesion is 90% stenosed. Prox LAD lesion is 90% stenosed. Previously placed Prox LAD to Mid LAD stent (unknown type) is widely patent.  Left Circumflex Previously placed Prox Cx to Mid Cx stent (unknown type) is widely patent.  Right Coronary Artery Ost RCA to Prox RCA lesion is 100% stenosed.  Right Posterior Atrioventricular Artery Collaterals RPAV filled by collaterals from Dist LAD.  Intervention  Ost LAD lesion Stent A stent was successfully placed. Post-Intervention Lesion Assessment The intervention was successful. Pre-interventional TIMI flow is 3. Post-intervention TIMI flow is 3. No complications occurred at this lesion. There is a 0% residual stenosis post intervention.  Prox LAD lesion Stent A stent was successfully placed. Post-Intervention Lesion Assessment The intervention was successful. Pre-interventional TIMI flow is 3. Post-intervention TIMI flow is 3. No complications occurred at this lesion. There is a 0% residual stenosis post intervention.   CARDIAC CATHETERIZATION 06/15/2019  Narrative  Acute on chronic combined systolic and diastolic heart failure, decompensated with severe elevation in LVEDP and mean pulmonary capillary wedge, both greater than 30 mmHg.  EF by echo is 25%.  Severe coronary artery disease with angulated high-grade stenosis in the proximal LAD that includes in-stent restenosis.  Widely patent left main  Moderate circumflex disease with 60 to 70% stenosis in the mid vessel after the first obtuse marginal followed by 85% stenosis in a large branch, 40% segmental  stenosis in the proximal first obtuse marginal, focal 50% in-stent restenosis within overlapping stents in the third obtuse marginal.  50% segmental narrowing proximal to the third obtuse marginal stents.  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  RCA appeared to be codominant.  Left to right collaterals are noted.  RECOMMENDATIONS:   Heart failure therapy.  40 mg of IV furosemide was given postprocedure  Watch kidney function closely as she is at risk for acute kidney injury.  Revascularization should be considered.  Surgical versus percutaneous will be the debate.  This cannot be done until heart  failure is much better compensated.  Also of question is whether anterior wall is viable.  Findings Coronary Findings Diagnostic  Dominance: Co-dominant  Left Anterior Descending Prox LAD to Mid LAD lesion is 95% stenosed. Mid LAD-1 lesion is 75% stenosed. The lesion was previously treated. Mid LAD-2 lesion is 30% stenosed. The lesion was previously treated. Mid LAD-3 lesion is 60% stenosed.  First Diagonal Branch Vessel is small in size.  Left Circumflex Prox Cx to Mid Cx lesion is 65% stenosed. Dist Cx lesion is 30% stenosed with 30% stenosed side branch in LPAV.  First Obtuse Marginal Branch 1st Mrg-1 lesion is 45% stenosed. 1st Mrg-2 lesion is 80% stenosed.  Second Obtuse Marginal Branch Vessel is small in size.  Third Obtuse Marginal Branch 3rd Mrg-1 lesion is 40% stenosed. 3rd Mrg-2 lesion is 50% stenosed. The lesion was previously treated.  First Left Posterolateral Branch Vessel is small in size.  Second Left Posterolateral Branch Vessel is small in size.  Third Left Posterolateral Branch Vessel is small in size.  Left Posterior Atrioventricular Artery Vessel is small in size.  Right Coronary Artery Prox RCA lesion is 100% stenosed. Mid RCA to Dist RCA lesion is 100% stenosed.  Right Posterior  Descending Artery Collaterals RPDA filled by collaterals from 2nd Sept.  Intervention  No interventions have been documented.   STRESS TESTS  NM MYOCAR MULTI W/SPECT W 08/30/2009   ECHOCARDIOGRAM  ECHOCARDIOGRAM COMPLETE 04/30/2022  Narrative ECHOCARDIOGRAM REPORT    Patient Name:   Tracey Manning Date of Exam: 04/30/2022 Medical Rec #:  [id number]       Height:       61.0 in Accession #:    [id number]      Weight:       222.4 lb Date of Birth:  [birth year]       BSA:          1.976 m Patient Age:    60 years        BP:           157/97 mmHg Patient Gender: F               HR:           72 bpm. Exam Location:  Inpatient  Procedure: 2D Echo, Cardiac Doppler and Color Doppler  Indications:    Cardiomyopathy-Unspecified I42.9  History:        Patient has prior history of Echocardiogram examinations, most recent 10/01/2021. CAD, COPD; Risk Factors:Hypertension, Dyslipidemia and Current Smoker.  Sonographer:    Leta Jungling RDCS Referring Phys: [id number] DANIEL R BENSIMHON  IMPRESSIONS   1. Left ventricular ejection fraction, by estimation, is 50 to 55%. The left ventricle has low normal function. The left ventricle has no regional wall motion abnormalities. Left ventricular diastolic parameters are consistent with Grade I diastolic dysfunction (impaired relaxation). 2. Right ventricular systolic function is normal. The right ventricular size is normal. There is normal pulmonary artery systolic pressure. The estimated right ventricular systolic pressure is 21.0 mmHg. 3. The mitral valve is grossly normal. Trivial mitral valve regurgitation. No evidence of mitral stenosis. 4. The aortic valve is tricuspid. There is mild calcification of the aortic valve. There is mild thickening of the aortic valve. Aortic valve regurgitation is not visualized. Aortic valve sclerosis/calcification is present, without any evidence of aortic stenosis. 5. The inferior vena cava is normal in size with  greater than 50% respiratory variability, suggesting right atrial pressure of 3 mmHg.  Comparison(s): Compared to prior  TTE in 09/2021, there is no significant change.  FINDINGS Left Ventricle: Left ventricular ejection fraction, by estimation, is 50 to 55%. The left ventricle has low normal function. The left ventricle has no regional wall motion abnormalities. The left ventricular internal cavity size was normal in size. There is no left ventricular hypertrophy. Left ventricular diastolic parameters are consistent with Grade I diastolic dysfunction (impaired relaxation).  Right Ventricle: The right ventricular size is normal. No increase in right ventricular wall thickness. Right ventricular systolic function is normal. There is normal pulmonary artery systolic pressure. The tricuspid regurgitant velocity is 2.12 m/s, and with an assumed right atrial pressure of 3 mmHg, the estimated right ventricular systolic pressure is 21.0 mmHg.  Left Atrium: Left atrial size was normal in size.  Right Atrium: Right atrial size was normal in size.  Pericardium: There is no evidence of pericardial effusion.  Mitral Valve: The mitral valve is grossly normal. There is mild thickening of the mitral valve leaflet(s). Trivial mitral valve regurgitation. No evidence of mitral valve stenosis.  Tricuspid Valve: The tricuspid valve is normal in structure. Tricuspid valve regurgitation is trivial.  Aortic Valve: The aortic valve is tricuspid. There is mild calcification of the aortic valve. There is mild thickening of the aortic valve. Aortic valve regurgitation is not visualized. Aortic valve sclerosis/calcification is present, without any evidence of aortic stenosis.  Pulmonic Valve: The pulmonic valve was normal in structure. Pulmonic valve regurgitation is trivial.  Aorta: The aortic root and ascending aorta are structurally normal, with no evidence of dilitation.  Venous: The inferior vena cava is normal  in size with greater than 50% respiratory variability, suggesting right atrial pressure of 3 mmHg.  IAS/Shunts: The atrial septum is grossly normal.   LEFT VENTRICLE PLAX 2D LVIDd:         5.60 cm      Diastology LVIDs:         4.10 cm      LV e' medial:    4.06 cm/s LV PW:         0.90 cm      LV E/e' medial:  10.5 LV IVS:        1.00 cm      LV e' lateral:   5.43 cm/s LVOT diam:     2.00 cm      LV E/e' lateral: 7.9 LV SV:         50 LV SV Index:   25 LVOT Area:     3.14 cm  LV Volumes (MOD) LV vol d, MOD A2C: 127.0 ml LV vol d, MOD A4C: 92.1 ml LV vol s, MOD A2C: 69.0 ml LV vol s, MOD A4C: 47.2 ml LV SV MOD A2C:     58.0 ml LV SV MOD A4C:     92.1 ml LV SV MOD BP:      54.3 ml  RIGHT VENTRICLE RV S prime:     10.80 cm/s TAPSE (M-mode): 1.4 cm  LEFT ATRIUM             Index        RIGHT ATRIUM           Index LA diam:        3.80 cm 1.92 cm/m   RA Area:     11.10 cm LA Vol (A2C):   26.0 ml 13.16 ml/m  RA Volume:   21.80 ml  11.03 ml/m LA Vol (A4C):   35.0 ml 17.71 ml/m LA Biplane  Vol: 31.5 ml 15.94 ml/m AORTIC VALVE LVOT Vmax:   85.30 cm/s LVOT Vmean:  67.800 cm/s LVOT VTI:    0.159 m  AORTA Ao Root diam: 2.80 cm Ao Asc diam:  3.20 cm  MITRAL VALVE               TRICUSPID VALVE MV Area (PHT): 3.21 cm    TR Peak grad:   18.0 mmHg MV Decel Time: 236 msec    TR Vmax:        212.00 cm/s MV E velocity: 42.80 cm/s MV A velocity: 52.30 cm/s  SHUNTS MV E/A ratio:  0.82        Systemic VTI:  0.16 m Systemic Diam: 2.00 cm  Laurance Flatten MD Electronically signed by Laurance Flatten MD Signature Date/Time: 04/30/2022/4:41:39 PM    Final    MONITORS  LONG TERM MONITOR (3-14 DAYS) 11/05/2020  Narrative 1. Sinus rhythm - avg HR of 90 bpm. 2. One 6-beat run NSVT 3. Five runs of SVT - the longest lasting 24.6 secs with an avg rate of 119 bpm. Isolated SVEs were rare (<1.0%) 4. Occasional PVCs (4.0%, [id number]) - however on one day there was 10.5% burden and  another ay 8.7% burden.  Arvilla Meres, MD 2:25 PM          Recent Labs: 12/18/2022: ALT 12; BUN 15; Creat 1.06; Hemoglobin 13.0; Platelets 282; Potassium 4.5; Sodium 142; TSH 1.50  Recent Lipid Panel    Component Value Date/Time   CHOL 120 12/18/2022 0000   CHOL 130 08/06/2021 1608   TRIG 148 12/18/2022 0000   HDL 40 (L) 12/18/2022 0000   HDL 42 08/06/2021 1608   CHOLHDL 3.0 12/18/2022 0000   VLDL 32 06/14/2019 0216   LDLCALC 57 12/18/2022 0000    History of Present Illness    62 year old female with the above past medical history including CAD s/p multiple PCIs, chronic systolic heart failure, ICM, PVCs, hypertension, hyperlipidemia, OSA (intolerant to CPAP), prediabetes, COPD, tobacco use, GERD, anxiety, and depression.   She had [place] remote PCI in 2004 in [place]. She had a PCI-LCx in 2008 by Dr. Tresa Endo. She has known occluded RCA with left-to-right collaterals.  She underwent DES-LAD in 2012.  She was hospitalized in July 2020 the setting of respiratory failure, unstable angina.  Echocardiogram dysfunction with EF 25%.  Cardiac catheterization in July 2020 showed 60-70% mLCx stenosis, known occluded RCA with left-to-right collaterals, and a 95% pLAD before the previously placed stent which was 50 to 60% narrowed. She was not considered a candidate for CABG and ultimately underwent DES x2-o-pLAD. Echo in October 2020 showed EF 25 to 30%.  She was hospitalized in April 2022 with increased shortness of breath, diuresed with IV Lasix.  Echocardiogram at the time showed EF 30 to 35%.  Most recent echo in November 2020 showed EF 50%.  She has been followed by the advanced heart failure clinic.  Outpatient cardiac monitor in December 2021 showed NSVT, PSVT, and occasional PVCs. She last saw Dr. Gala Romney on 04/10/2022 and was stable from a cardiac standpoint.  She denied symptoms concerning for angina.  Repeat echocardiogram in 04/2022 showed EF 50 to 55%, low normal LV function, no RWMA, G1 DD,  normal RV, no significant valvular abnormalities.  She was released from the advanced heart failure clinic.  Of note, she is intolerant to metoprolol, carvedilol, and Imdur due to headache. She did not tolerate Farxiga due to AKI.  She was last seen in the  office on 09/29/2022 and was doing well from a cardiac standpoint.  She denies symptoms concerning for angina.  BP was stable.   She presents today for follow-up.  Since her last visit she has done well from a cardiac standpoint.  She denies any symptoms concerning for angina, denies palpitations, dyspnea, edema, PND, orthopnea, weight gain.  She states she stopped taking her Repatha as she "I don't like needles." She is taking her Lipitor. She continues to smoke but has cut back significantly.  Overall, she reports feeling well.  Home Medications    Current Outpatient Medications  Medication Sig Dispense Refill   acetaminophen (TYLENOL) 500 MG tablet Take 1,500 mg by mouth daily as needed (pain).     albuterol (PROVENTIL HFA) 108 (90 Base) MCG/ACT inhaler Inhale 2 puffs by mouth every 4 hours as needed 6.7 g 5   aspirin 81 MG EC tablet Take 1 tablet (81 mg total) by mouth daily. 90 tablet 3   atorvastatin (LIPITOR) 80 MG tablet Take 1 tablet (80 mg total) by mouth daily. 90 tablet 2   clopidogrel (PLAVIX) 75 MG tablet TAKE 1 TABLET (75 MG TOTAL) BY MOUTH DAILY (AM) (Patient taking differently: Take 75 mg by mouth daily.) 90 tablet 2   diclofenac Sodium (VOLTAREN) 1 % GEL Apply 4 g topically 4 (four) times daily as needed (pain).     ENTRESTO 97-103 MG TAKE 1 TABLET BY MOUTH 2 (TWO) TIMES DAILY (AM+BEDTIME) (Patient taking differently: Take 1 tablet by mouth 2 (two) times daily.) 180 tablet 3   Fluticasone-Umeclidin-Vilant (TRELEGY ELLIPTA) 200-62.5-25 MCG/INH AEPB Inhale 1 puff into the lungs daily as needed (wheezing/sob).     potassium chloride (KLOR-CON) 10 MEQ tablet TAKE 2 TABLETS BY MOUTH 2 (TWO) TIMES DAILY. (AM+BEDTIME) 120 tablet 7    pregabalin (LYRICA) 100 MG capsule Take 100 mg by mouth 2 (two) times daily.     spironolactone (ALDACTONE) 25 MG tablet TAKE 1 TABLET (25 MG TOTAL) BY MOUTH DAILY. (AM) 90 tablet 3   terconazole (TERAZOL 3) 0.8 % vaginal cream Place 1 applicator vaginally at bedtime. Apply nightly for three nights. 20 g 0   terconazole (TERAZOL 3) 0.8 % vaginal cream Place 1 applicator vaginally at bedtime. Apply nightly for three nights. 20 g 0   triamcinolone cream (KENALOG) 0.1 % Apply 1 application topically 2 (two) times daily. 60 g 0   Vitamin D, Ergocalciferol, (DRISDOL) 1.25 MG (50000 UNIT) CAPS capsule Take 50,000 Units by mouth once a week.     zolpidem (AMBIEN) 10 MG tablet Take 1 tablet (10 mg total) by mouth at bedtime as needed for sleep 30 tablet 0   albuterol (PROVENTIL) (2.5 MG/3ML) 0.083% nebulizer solution INHALE 1 VIAL VIA NEBULIZER EVERY 6 HOURS AS NEEDED (Patient taking differently: Take 2.5 mg by nebulization every 6 (six) hours as needed for wheezing or shortness of breath.) 90 mL 11   cetirizine (ZYRTEC) 10 MG tablet TAKE 1 TABLET BY MOUTH ONCE A DAY AS NEEDED (Patient taking differently: Take 10 mg by mouth daily as needed for allergies.) 90 tablet 2   nitroGLYCERIN (NITROSTAT) 0.4 MG SL tablet DISSOLVE 1 TABLET UNDER THE TONGUE AS NEEDED AS DIRECTED (Patient taking differently: Place 0.4 mg under the tongue every 5 (five) minutes as needed for chest pain.) 25 tablet 5   REPATHA SURECLICK 140 MG/ML SOAJ INJECT 1 DOSE INTO THE SKIN EVERY 14 (FOURTEEN) DAYS. (Patient not taking: Reported on 06/04/2023) 2 mL 11   No current facility-administered  medications for this visit.     Review of Systems    She denies chest pain, palpitations, dyspnea, pnd, orthopnea, n, v, dizziness, syncope, edema, weight gain, or early satiety. All other systems reviewed and are otherwise negative except as noted above.   Physical Exam    VS:  BP 110/70   Pulse (!) 56   Ht 5\' 1"  (1.549 m)   Wt 217 lb 12.8 oz  (98.8 kg)   LMP  (LMP Unknown)   SpO2 100%   BMI 41.15 kg/m   GEN: Well nourished, well developed, in no acute distress. HEENT: normal. Neck: Supple, no JVD, carotid bruits, or masses. Cardiac: RRR, no murmurs, rubs, or gallops. No clubbing, cyanosis, edema.  Radials/DP/PT 2+ and equal bilaterally.  Respiratory:  Respirations regular and unlabored, clear to auscultation bilaterally. GI: Soft, nontender, nondistended, BS + x 4. MS: no deformity or atrophy. Skin: warm and dry, no rash. Neuro:  Strength and sensation are intact. Psych: Normal affect.  Accessory Clinical Findings    ECG personally reviewed by me today -    - no EKG in office today.   Lab Results  Component Value Date   WBC 6.6 12/18/2022   HGB 13.0 12/18/2022   HCT 37.2 12/18/2022   MCV 82.3 12/18/2022   PLT 282 12/18/2022   Lab Results  Component Value Date   CREATININE 1.06 (H) 12/18/2022   BUN 15 12/18/2022   NA 142 12/18/2022   K 4.5 12/18/2022   CL 107 12/18/2022   CO2 22 12/18/2022   Lab Results  Component Value Date   ALT 12 12/18/2022   AST 11 12/18/2022   ALKPHOS 82 09/30/2022   BILITOT 0.3 12/18/2022   Lab Results  Component Value Date   CHOL 120 12/18/2022   HDL 40 (L) 12/18/2022   LDLCALC 57 12/18/2022   TRIG 148 12/18/2022   CHOLHDL 3.0 12/18/2022    Lab Results  Component Value Date   HGBA1C 6.4 (H) 05/19/2021    Assessment & Plan    1. Chronic systolic heart failure due to ICM: Prior EF 25-30%. Repeat echocardiogram in 04/2022 showed EF 50 to 55%, low normal LV function, no RWMA, G1 DD, normal RV, no significant valvular abnormalities. She is intolerant to metoprolol, carvedilol, and Imdur due to headache. She did not tolerate Farxiga due to AKI. Euvolemic and well compensated on exam. Continue Entresto, spironolactone.  2. CAD: S/p PCI in [place] in 2004, s/p PCI-OM in 2008, s/p DES-LAD in 2020. Stable with no anginal symptoms. No indication for ischemic evaluation.  Continue  aspirin, Plavix, Entresto, spironolactone, and Lipitor.  3. Hypertension: BP well controlled. Continue current antihypertensive regimen.    4. Hyperlipidemia: LDL was 57 in 11/2022.  She has stopped taking her Repatha as she states "I do not like needles."  Will range for follow-up with Dr. Rennis Golden for consideration of alternative lipid-lowering therapy.  Continue Lipitor.   5. OSA: Adherent to CPAP. Stable.   6. PVCs/NSVT/PSVT: Outpatient cardiac monitor in December 2021 showed NSVT, PSVT, and occasional PVCs.  She denies palpitations.    7. COPD with ongoing tobacco use: She continues to smoke but states she is trying to cut down. Full cessation advised.    8. Obesity: Encouraged lifestyle modifications with diet and exercise.  9. Disposition: Follow-up with Dr. Rennis Golden for lipids, follow-up in 6 months with Dr. Allyson Sabal.      Joylene Grapes, NP 06/04/2023, 1:41 PM

## 2023-06-04 NOTE — Patient Instructions (Signed)
Medication Instructions:  Your physician recommends that you continue on your current medications as directed. Please refer to the Current Medication list given to you today.  *If you need a refill on your cardiac medications before your next appointment, please call your pharmacy*   Lab Work: NONE ordered at this time of appointment     Testing/Procedures: NONE ordered at this time of appointment     Follow-Up: At [facility], you and your health needs are our priority.  As part of our continuing mission to provide you with exceptional heart care, we have created designated Provider Care Teams.  These Care Teams include your primary Cardiologist (physician) and Advanced Practice Providers (APPs -  Physician Assistants and Nurse Practitioners) who all work together to provide you with the care you need, when you need it.  We recommend signing up for the patient portal called "MyChart".  Sign up information is provided on this After Visit Summary.  MyChart is used to connect with patients for Virtual Visits (Telemedicine).  Patients are able to view lab/test results, encounter notes, upcoming appointments, etc.  Non-urgent messages can be sent to your provider as well.   To learn more about what you can do with MyChart, go to [email or web address].    Your next appointment:   Next Available Lipid apt with Dr. Rennis Golden  6 months Dr . Allyson Sabal Provider:   Nanetta Batty, MD

## 2023-06-17 ENCOUNTER — Ambulatory Visit: Payer: Medicaid Other | Admitting: Internal Medicine

## 2023-08-31 ENCOUNTER — Other Ambulatory Visit (HOSPITAL_COMMUNITY)
Admission: RE | Admit: 2023-08-31 | Discharge: 2023-08-31 | Disposition: A | Discharge status: Home / Self Care | Payer: Medicaid Other | Source: Ambulatory Visit | Attending: Obstetrics & Gynecology | Admitting: Obstetrics & Gynecology

## 2023-08-31 ENCOUNTER — Ambulatory Visit: Payer: Medicaid Other | Admitting: Obstetrics & Gynecology

## 2023-08-31 ENCOUNTER — Encounter: Payer: Self-pay | Admitting: Obstetrics & Gynecology

## 2023-08-31 VITALS — BP 118/83 | HR 69 | Ht 61.0 in | Wt 220.0 lb

## 2023-08-31 DIAGNOSIS — Z1339 Encounter for screening examination for other mental health and behavioral disorders: Secondary | ICD-10-CM | POA: Diagnosis not present

## 2023-08-31 DIAGNOSIS — Z01419 Encounter for gynecological examination (general) (routine) without abnormal findings: Secondary | ICD-10-CM | POA: Diagnosis present

## 2023-08-31 DIAGNOSIS — Z780 Asymptomatic menopausal state: Secondary | ICD-10-CM | POA: Diagnosis not present

## 2023-08-31 NOTE — Progress Notes (Unsigned)
GYNECOLOGY CLINIC ANNUAL PREVENTATIVE CARE ENCOUNTER NOTE  Subjective:   Tracey Manning is a 62 y.o. [phone] female here for a routine annual gynecologic exam.  Current complaints: annual exam.   Denies vaginal bleeding, discharge, pelvic pain, problems with intercourse or other gynecologic concerns.    Gynecologic History No LMP recorded (lmp unknown). Patient is postmenopausal.  Last Pap: 2021. Results were: normal Last mammogram: 01/2021. Results were: normal  Obstetric History OB History  Gravida Para Term Preterm AB Living  8 2 2   6 2   SAB IAB Ectopic Multiple Live Births  6       2    # Outcome Date GA Lbr Len/2nd Weight Sex Type Anes PTL Lv  8 Term           7 Term           6 SAB           5 SAB           4 SAB           3 SAB           2 SAB           1 SAB             Past Medical History:  Diagnosis Date   Anxiety    Arthritis    Asthma    Barrett's esophagus    Chest pain    Collagen vascular disease (HCC)    Colon polyps    Coronary artery disease    Depression    Dyslipidemia    GERD (gastroesophageal reflux disease)    occ   Hyperlipidemia    Hypertension    Myocardial infarct (HCC) 8119,1478   Sciatica    Sleep apnea    no CPAP ordered but using oxygen at bedtime   Tobacco abuse     Past Surgical History:  Procedure Laterality Date   CARDIAC CATHETERIZATION  10/09/2003   [facility] [place]) - LAD with 30% prox narrowing, 50% stenosis in mid-portion of PLA; RCA with 40% narrowing proximally (Dr. Nicholes Calamity, [name])   CARDIAC CATHETERIZATION  10/10/2007   70% stenosis in first septal perforator branch of LAD, 60-70% narrowing in mid LAD, 20% narrowing in mid AV groove Cfx with 80% diffuse narrowing in small distal marginal, total occlusion of mid RCA with L to R collaterals, 90% stenosis diffusely in prox branch of RCA followed by 70% stenosis in secondary curve & 80% stenosis in small marginal branch (Dr. Bishop Limbo)   CARDIAC  CATHETERIZATION  05/28/2008   normal L main, RCA with 100% prox lesion w/distal filling from LAD collaterals, LAD with 20% prox tubular lesion/ 40% mid LAD lesion/previous stent patent (Dr. Evlyn Courier)   CARDIAC CATHETERIZATION  09/15/2009   discrete 100% osital RCA lesion, 50% prox LAD lesion, non-obstructive disease in all coronaries (Dr. Claudia Desanctis)   CESAREAN SECTION     COLONOSCOPY     COLONOSCOPY W/ POLYPECTOMY     CORONARY ANGIOPLASTY WITH STENT PLACEMENT  10/31/2007   PCI of distal Cfx with 2.25x35mm Taxus Adam DES, 60% narrowing of mid LAD (Dr. Bishop Limbo)   CORONARY ANGIOPLASTY WITH STENT PLACEMENT  06/11/2011   PCI of prox-mid LAD with 3x75mm DES Resolute (Dr. Bishop Limbo)   CORONARY STENT INTERVENTION N/A 06/19/2019   Procedure: CORONARY STENT INTERVENTION;  Surgeon: Runell Gess, MD;  Location: [facility];  Service: Cardiovascular;  Laterality:  N/A;  LAD   DILATION AND CURETTAGE, DIAGNOSTIC / THERAPEUTIC     DILITATION & CURRETTAGE/HYSTROSCOPY WITH HYDROTHERMAL ABLATION N/A 04/03/2016   Procedure: DILATATION & CURETTAGE/HYSTEROSCOPY WITH HYDROTHERMAL ABLATION;  Surgeon: Brock Bad, MD;  Location: [facility];  Service: Gynecology;  Laterality: N/A;   NM MYOCAR PERF WALL MOTION  08/2009   persantine myoview - normal perfusion in all regions, perfusion defect in anterior region (breast attenuation), EF 52%, low risk scan   RIGHT/LEFT HEART CATH AND CORONARY ANGIOGRAPHY N/A 06/15/2019   Procedure: RIGHT/LEFT HEART CATH AND CORONARY ANGIOGRAPHY;  Surgeon: Lyn Records, MD;  Location: [facility];  Service: Cardiovascular;  Laterality: N/A;    Current Outpatient Medications on File Prior to Visit  Medication Sig Dispense Refill   acetaminophen (TYLENOL) 500 MG tablet Take 1,500 mg by mouth daily as needed (pain).     albuterol (PROVENTIL HFA) 108 (90 Base) MCG/ACT inhaler Inhale 2 puffs by mouth every 4 hours as needed 6.7 g 5   albuterol (PROVENTIL) (2.5 MG/3ML) 0.083%  nebulizer solution INHALE 1 VIAL VIA NEBULIZER EVERY 6 HOURS AS NEEDED (Patient taking differently: Take 2.5 mg by nebulization every 6 (six) hours as needed for wheezing or shortness of breath.) 90 mL 11   aspirin 81 MG EC tablet Take 1 tablet (81 mg total) by mouth daily. 90 tablet 3   atorvastatin (LIPITOR) 80 MG tablet Take 1 tablet (80 mg total) by mouth daily. (Patient not taking: Reported on 08/31/2023) 90 tablet 2   cetirizine (ZYRTEC) 10 MG tablet TAKE 1 TABLET BY MOUTH ONCE A DAY AS NEEDED (Patient taking differently: Take 10 mg by mouth daily as needed for allergies.) 90 tablet 2   clopidogrel (PLAVIX) 75 MG tablet TAKE 1 TABLET (75 MG TOTAL) BY MOUTH DAILY (AM) (Patient taking differently: Take 75 mg by mouth daily.) 90 tablet 2   diclofenac Sodium (VOLTAREN) 1 % GEL Apply 4 g topically 4 (four) times daily as needed (pain).     ENTRESTO 97-103 MG TAKE 1 TABLET BY MOUTH 2 (TWO) TIMES DAILY (AM+BEDTIME) (Patient taking differently: Take 1 tablet by mouth 2 (two) times daily.) 180 tablet 3   Fluticasone-Umeclidin-Vilant (TRELEGY ELLIPTA) 200-62.5-25 MCG/INH AEPB Inhale 1 puff into the lungs daily as needed (wheezing/sob).     nitroGLYCERIN (NITROSTAT) 0.4 MG SL tablet DISSOLVE 1 TABLET UNDER THE TONGUE AS NEEDED AS DIRECTED (Patient taking differently: Place 0.4 mg under the tongue every 5 (five) minutes as needed for chest pain.) 25 tablet 5   potassium chloride (KLOR-CON) 10 MEQ tablet TAKE 2 TABLETS BY MOUTH 2 (TWO) TIMES DAILY. (AM+BEDTIME) 120 tablet 7   pregabalin (LYRICA) 100 MG capsule Take 100 mg by mouth 2 (two) times daily. (Patient not taking: Reported on 08/31/2023)     REPATHA SURECLICK 140 MG/ML SOAJ INJECT 1 DOSE INTO THE SKIN EVERY 14 (FOURTEEN) DAYS. (Patient not taking: Reported on 06/04/2023) 2 mL 11   spironolactone (ALDACTONE) 25 MG tablet TAKE 1 TABLET (25 MG TOTAL) BY MOUTH DAILY. (AM) 90 tablet 3   terconazole (TERAZOL 3) 0.8 % vaginal cream Place 1 applicator vaginally  at bedtime. Apply nightly for three nights. 20 g 0   terconazole (TERAZOL 3) 0.8 % vaginal cream Place 1 applicator vaginally at bedtime. Apply nightly for three nights. 20 g 0   triamcinolone cream (KENALOG) 0.1 % Apply 1 application topically 2 (two) times daily. 60 g 0   Vitamin D, Ergocalciferol, (DRISDOL) 1.25 MG (50000 UNIT) CAPS capsule  Take 50,000 Units by mouth once a week. (Patient not taking: Reported on 08/31/2023)     zolpidem (AMBIEN) 10 MG tablet Take 1 tablet (10 mg total) by mouth at bedtime as needed for sleep 30 tablet 0   No current facility-administered medications on file prior to visit.    Allergies  Allergen Reactions   Coreg [Carvedilol] Other (See Comments)    Headaches/migraine   Strawberry Extract Anaphylaxis, Hives, Swelling and Other (See Comments)    "Fresh strawberries" made the throat swell (2014)   Imdur [Isosorbide Nitrate] Other (See Comments)    Headaches    Amoxicillin Nausea And Vomiting    Did it involve swelling of the face/tongue/throat, SOB, or low BP? Yes Did it involve sudden or severe rash/hives, skin peeling, or any reaction on the inside of your mouth or nose? Yes Did you need to seek medical attention at a hospital or doctor's office? Yes When did it last happen?      within last 10 years If all above answers are "NO", may proceed with cephalosporin use.    Dilaudid [Hydromorphone Hcl] Nausea And Vomiting   Ibuprofen Other (See Comments)    Wheezing    Latex Swelling and Other (See Comments)    No reaction with Band-Aids, though   Metoprolol Other (See Comments)    Headaches and wheezing   Coconut Flavor Hives and Swelling    Throat swelling    Social History   Socioeconomic History   Marital status: Divorced    Spouse name: Not on file   Number of children: 2   Years of education: 12   Highest education level: High school graduate  Occupational History   Not on file  Tobacco Use   Smoking status: Every Day    Current  packs/day: 0.50    Average packs/day: 0.5 packs/day for 34.0 years (17.0 ttl pk-yrs)    Types: Cigarettes   Smokeless tobacco: Never  Vaping Use   Vaping status: Never Used  Substance and Sexual Activity   Alcohol use: Not Currently    Comment: seldom   Drug use: Yes    Types: Marijuana    Comment: 06/19/19   Sexual activity: Yes    Partners: Male    Birth control/protection: Post-menopausal  Other Topics Concern   Not on file  Social History Narrative   Not on file   Social Determinants of Health   Financial Resource Strain: Medium Risk (03/07/2021)   Overall Financial Resource Strain (CARDIA)    Difficulty of Paying Living Expenses: Somewhat hard  Food Insecurity: Food Insecurity Present (03/07/2021)   Hunger Vital Sign    Worried About Running Out of Food in the Last Year: Sometimes true    Ran Out of Food in the Last Year: Sometimes true  Transportation Needs: Unmet Transportation Needs (03/07/2021)   PRAPARE - Transportation    Lack of Transportation (Medical): No    Lack of Transportation (Non-Medical): Yes  Physical Activity: Inactive (07/25/2019)   Exercise Vital Sign    Days of Exercise per Week: 0 days    Minutes of Exercise per Session: 0 min  Stress: Stress Concern Present (07/25/2019)   [employer] of Occupational Health - Occupational Stress Questionnaire    Feeling of Stress : To some extent  Social Connections: Unknown (04/05/2022)   Received from [facility]   Social Network    Social Network: Not on file  Intimate Partner Violence: Unknown (02/25/2022)   Received from [facility],  [facility]   HITS    Physically Hurt: Not on file    Insult or Talk Down To: Not on file    Threaten Physical Harm: Not on file    Scream or Curse: Not on file    Family History  Problem Relation Age of Onset   Leukemia Mother    Clotting disorder Father        blood clot   Hypertension Sister    Diabetes Sister    Stroke Sister 49   Bleeding  Disorder Son        ITP "free bleeding disorder"   Colon cancer Paternal Grandmother    Diabetes Paternal Grandmother    Stomach cancer Neg Hx    Pancreatic cancer Neg Hx     The following portions of the patient's history were reviewed and updated as appropriate: allergies, current medications, past family history, past medical history, past social history, past surgical history and problem list.  Review of Systems Pertinent items are noted in HPI.   Objective:  BP 118/83   Pulse 69   Ht 5\' 1"  (1.549 m)   Wt 220 lb (99.8 kg)   LMP  (LMP Unknown)   BMI 41.57 kg/m  CONSTITUTIONAL: Well-developed, obese female in no acute distress.  HENT:  Normocephalic, atraumatic, External right and left ear normal. Oropharynx is clear and moist EYES: Conjunctivae and EOM are normal. Pupils are equal, round, and reactive to light. No scleral icterus.  NECK: Normal range of motion, supple, no masses.  Normal thyroid.  SKIN: Skin is warm and dry. No rash noted. Not diaphoretic. No erythema. No pallor. NEUROLGIC: Alert and oriented to person, place, and time. Normal reflexes, muscle tone coordination. No cranial nerve deficit noted. PSYCHIATRIC: Normal mood and affect. Normal behavior. Normal judgment and thought content. CARDIOVASCULAR: Normal heart rate noted, regular rhythm RESPIRATORY: Clear to auscultation bilaterally. Effort and breath sounds normal, no problems with respiration noted. BREASTS: Symmetric in size. No masses, skin changes, nipple drainage, or lymphadenopathy. ABDOMEN: Soft, normal bowel sounds, no distention noted.  No tenderness, rebound or guarding.  PELVIC: Normal appearing external genitalia; normal appearing vaginal mucosa and cervix.  No abnormal discharge noted.  Pap smear obtained.  Normal uterine size, no other palpable masses, no uterine or adnexal tenderness. MUSCULOSKELETAL: Normal range of motion. No tenderness.  No cyanosis, clubbing, or edema.    Assessment:   Annual gynecologic examination with pap smear   Plan:  Will follow up results of pap smear and manage accordingly. Mammogram scheduled Routine preventative health maintenance measures emphasized. Please refer to After Visit Summary for other counseling recommendations.    Scheryl Darter, MD Attending Obstetrician & Gynecologist Center for [employer], [facility]

## 2023-08-31 NOTE — Progress Notes (Addendum)
62 y.o. GYN presents for AEX/PAP/GCCT.  Pt will get FLU vaccine from her PCP.  Last Mammogram 01/14/2023

## 2023-09-01 LAB — CERVICOVAGINAL ANCILLARY ONLY
Bacterial Vaginitis (gardnerella): POSITIVE — AB
Chlamydia: NEGATIVE
Comment: NEGATIVE
Comment: NEGATIVE
Comment: NEGATIVE
Comment: NORMAL
Neisseria Gonorrhea: NEGATIVE
Trichomonas: NEGATIVE

## 2023-09-02 MED ORDER — CLEOCIN 100 MG VA SUPP: 100.0000 mg | Freq: Every day | VAGINAL | 0 refills | Status: DC

## 2023-09-03 LAB — CYTOLOGY - PAP
Adequacy: ABSENT
Comment: NEGATIVE
Diagnosis: NEGATIVE
High risk HPV: NEGATIVE

## 2023-09-13 ENCOUNTER — Other Ambulatory Visit: Payer: Self-pay

## 2023-09-13 DIAGNOSIS — Z01419 Encounter for gynecological examination (general) (routine) without abnormal findings: Secondary | ICD-10-CM

## 2023-09-13 DIAGNOSIS — Z780 Asymptomatic menopausal state: Secondary | ICD-10-CM

## 2023-09-13 MED ORDER — CLEOCIN 100 MG VA SUPP: 100.0000 mg | Freq: Every day | VAGINAL | 0 refills | Status: DC

## 2023-11-05 ENCOUNTER — Other Ambulatory Visit (HOSPITAL_COMMUNITY): Payer: Self-pay | Admitting: Cardiovascular Disease

## 2024-02-16 ENCOUNTER — Other Ambulatory Visit (HOSPITAL_COMMUNITY): Payer: Self-pay | Admitting: Cardiovascular Disease

## 2024-02-27 ENCOUNTER — Encounter (HOSPITAL_COMMUNITY): Payer: Self-pay | Admitting: Emergency Medicine

## 2024-02-27 ENCOUNTER — Emergency Department (HOSPITAL_COMMUNITY)

## 2024-02-27 ENCOUNTER — Emergency Department (HOSPITAL_COMMUNITY): Admission: EM | Admit: 2024-02-27 | Discharge: 2024-02-27 | Disposition: A | Discharge status: Home / Self Care

## 2024-02-27 DIAGNOSIS — J329 Chronic sinusitis, unspecified: Secondary | ICD-10-CM | POA: Insufficient documentation

## 2024-02-27 DIAGNOSIS — Z7902 Long term (current) use of antithrombotics/antiplatelets: Secondary | ICD-10-CM | POA: Diagnosis not present

## 2024-02-27 DIAGNOSIS — Z7982 Long term (current) use of aspirin: Secondary | ICD-10-CM | POA: Diagnosis not present

## 2024-02-27 DIAGNOSIS — I4581 Long QT syndrome: Secondary | ICD-10-CM | POA: Insufficient documentation

## 2024-02-27 DIAGNOSIS — Z79899 Other long term (current) drug therapy: Secondary | ICD-10-CM | POA: Diagnosis not present

## 2024-02-27 DIAGNOSIS — I2510 Atherosclerotic heart disease of native coronary artery without angina pectoris: Secondary | ICD-10-CM | POA: Diagnosis not present

## 2024-02-27 DIAGNOSIS — R9431 Abnormal electrocardiogram [ECG] [EKG]: Secondary | ICD-10-CM

## 2024-02-27 DIAGNOSIS — I10 Essential (primary) hypertension: Secondary | ICD-10-CM | POA: Insufficient documentation

## 2024-02-27 DIAGNOSIS — Z91040 Latex allergy status: Secondary | ICD-10-CM | POA: Insufficient documentation

## 2024-02-27 DIAGNOSIS — R059 Cough, unspecified: Secondary | ICD-10-CM | POA: Diagnosis present

## 2024-02-27 LAB — BASIC METABOLIC PANEL WITH GFR
Anion gap: 11 (ref 5–15)
BUN: 16 mg/dL (ref 8–23)
CO2: 23 mmol/L (ref 22–32)
Calcium: 9.6 mg/dL (ref 8.9–10.3)
Chloride: 103 mmol/L (ref 98–111)
Creatinine, Ser: 1.25 mg/dL — ABNORMAL HIGH (ref 0.44–1.00)
GFR, Estimated: 49 mL/min — ABNORMAL LOW (ref >60)
Glucose, Bld: 103 mg/dL — ABNORMAL HIGH (ref 70–99)
Potassium: 4.2 mmol/L (ref 3.5–5.1)
Sodium: 137 mmol/L (ref 135–145)

## 2024-02-27 LAB — CBC
HCT: 40.2 % (ref 36.0–46.0)
Hemoglobin: 13.5 g/dL (ref 12.0–15.0)
MCH: 28.9 pg (ref 26.0–34.0)
MCHC: 33.6 g/dL (ref 30.0–36.0)
MCV: 86.1 fL (ref 80.0–100.0)
Platelets: 216 10*3/uL (ref 150–400)
RBC: 4.67 MIL/uL (ref 3.87–5.11)
RDW: 16.8 % — ABNORMAL HIGH (ref 11.5–15.5)
WBC: 10.2 10*3/uL (ref 4.0–10.5)
nRBC: 0 % (ref 0.0–0.2)

## 2024-02-27 LAB — RESP PANEL BY RT-PCR (RSV, FLU A&B, COVID)  RVPGX2
Influenza A by PCR: NEGATIVE
Influenza B by PCR: NEGATIVE
Resp Syncytial Virus by PCR: NEGATIVE
SARS Coronavirus 2 by RT PCR: NEGATIVE

## 2024-02-27 LAB — TROPONIN I (HIGH SENSITIVITY)
Troponin I (High Sensitivity): 24 ng/L — ABNORMAL HIGH (ref <18)
Troponin I (High Sensitivity): 22 ng/L — ABNORMAL HIGH (ref <18)

## 2024-02-27 LAB — GROUP A STREP BY PCR: Group A Strep by PCR: NOT DETECTED

## 2024-02-27 MED ORDER — DOXYCYCLINE HYCLATE 100 MG PO CAPS: 100.0000 mg | ORAL_CAPSULE | Freq: Two times a day (BID) | ORAL | 0 refills | Status: DC

## 2024-02-27 MED ORDER — ACETAMINOPHEN-CODEINE 300-30 MG PO TABS
1.0000 | ORAL_TABLET | Freq: Once | ORAL | Status: AC
  Administered 2024-02-27: 1 via ORAL

## 2024-02-27 MED ORDER — OXYCODONE-ACETAMINOPHEN 5-325 MG PO TABS
1.0000 | ORAL_TABLET | Freq: Once | ORAL | Status: DC
  Filled 2024-02-27: qty 1

## 2024-02-27 MED ORDER — CLINDAMYCIN HCL 150 MG PO CAPS: 300.0000 mg | ORAL_CAPSULE | Freq: Once | ORAL | Status: DC

## 2024-02-27 MED ORDER — DOXYCYCLINE HYCLATE 100 MG PO TABS
100.0000 mg | ORAL_TABLET | Freq: Once | ORAL | Status: AC
  Administered 2024-02-27: 100 mg via ORAL
  Filled 2024-02-27: qty 1

## 2024-02-27 NOTE — ED Provider Notes (Signed)
 [place] EMERGENCY DEPARTMENT AT [facility] Provider Note   CSN: [id number] Arrival date & time: 02/27/24  4098     History  Chief Complaint  Patient presents with   Ear Pain   Eye Problem   Hypertension    Tracey Manning is a 63 y.o. female.  63 year old female with past medical history of hypertension, hyperlipidemia, and coronary artery disease presenting to the emergency department today with multiple complaints.  The patient states she has been having pain in her left ear as well as some left-sided dental pain now for the past week.  She also reports some nasal congestion with this.  She states that she has had a mild cough with this as well.  She does report myalgias as well.  Denies any fevers.  She states that she has seen ENT early on when she started developing the symptoms and her workup at that time was unremarkable.  There were some notes about floaters in her triage note.  In speaking with the patient she states that she had an episode earlier this week where she got up from her seat relatively quickly and felt lightheaded.  She states that she did have a short period of time where she thought she was seeing spots.  She states that this has resolved.  She denies this currently.  She denies any focal weakness, numbness, or tingling.  She is somewhat of a poor historian.   Eye Problem Hypertension       Home Medications Prior to Admission medications   Medication Sig Start Date End Date Taking? Authorizing Provider  doxycycline (VIBRAMYCIN) 100 MG capsule Take 1 capsule (100 mg total) by mouth 2 (two) times daily. 02/27/24  Yes Durwin Glaze, MD  acetaminophen (TYLENOL) 500 MG tablet Take 1,500 mg by mouth daily as needed (pain).    [provider]  albuterol (PROVENTIL HFA) 108 (90 Base) MCG/ACT inhaler Inhale 2 puffs by mouth every 4 hours as needed 05/02/21     albuterol (PROVENTIL) (2.5 MG/3ML) 0.083% nebulizer solution INHALE 1 VIAL VIA NEBULIZER  EVERY 6 HOURS AS NEEDED Patient taking differently: Take 2.5 mg by nebulization every 6 (six) hours as needed for wheezing or shortness of breath. 02/21/21 04/11/23  Fleet Contras, MD  aspirin 81 MG EC tablet Take 1 tablet (81 mg total) by mouth daily. 03/12/21   Kroeger, Ovidio Kin., PA-C  atorvastatin (LIPITOR) 80 MG tablet Take 1 tablet (80 mg total) by mouth daily. Patient not taking: Reported on 08/31/2023 05/02/21     cetirizine (ZYRTEC) 10 MG tablet TAKE 1 TABLET BY MOUTH ONCE A DAY AS NEEDED Patient taking differently: Take 10 mg by mouth daily as needed for allergies. 02/21/21 04/11/23  Fleet Contras, MD  clindamycin (CLEOCIN) 100 MG vaginal suppository Place 1 suppository (100 mg total) vaginally at bedtime. 09/13/23   Adam Phenix, MD  clopidogrel (PLAVIX) 75 MG tablet TAKE 1 TABLET (75 MG TOTAL) BY MOUTH DAILY (AM) Patient taking differently: Take 75 mg by mouth daily. 02/20/22   Bensimhon, Bevelyn Buckles, MD  diclofenac Sodium (VOLTAREN) 1 % GEL Apply 4 g topically 4 (four) times daily as needed (pain). 03/24/22   [provider]  ENTRESTO 97-103 MG TAKE 1 TABLET BY MOUTH 2 (TWO) TIMES DAILY (AM+BEDTIME) Patient taking differently: Take 1 tablet by mouth 2 (two) times daily. 04/23/22   Bensimhon, Bevelyn Buckles, MD  Fluticasone-Umeclidin-Vilant (TRELEGY ELLIPTA) 200-62.5-25 MCG/INH AEPB Inhale 1 puff into the lungs daily as needed (wheezing/sob).  [provider]  nitroGLYCERIN (NITROSTAT) 0.4 MG SL tablet DISSOLVE 1 TABLET UNDER THE TONGUE AS NEEDED AS DIRECTED Patient taking differently: Place 0.4 mg under the tongue every 5 (five) minutes as needed for chest pain. 03/12/21 04/11/23  Kroeger, Ovidio Kin., PA-C  potassium chloride (KLOR-CON) 10 MEQ tablet TAKE 2 TABLETS BY MOUTH 2 (TWO) TIMES DAILY. (2AM+2BEDTIME) 02/16/24   Runell Gess, MD  pregabalin (LYRICA) 100 MG capsule Take 100 mg by mouth 2 (two) times daily. Patient not taking: Reported on 08/31/2023 03/24/22   [provider]  REPATHA SURECLICK 140 MG/ML SOAJ INJECT 1 DOSE INTO THE SKIN EVERY 14 (FOURTEEN) DAYS. Patient not taking: Reported on 06/04/2023 11/26/21   Chrystie Nose, MD  spironolactone (ALDACTONE) 25 MG tablet TAKE 1 TABLET (25 MG TOTAL) BY MOUTH DAILY. (AM) 05/25/23   Bensimhon, Bevelyn Buckles, MD  terconazole (TERAZOL 3) 0.8 % vaginal cream Place 1 applicator vaginally at bedtime. Apply nightly for three nights. 08/01/22   Constant, Peggy, MD  terconazole (TERAZOL 3) 0.8 % vaginal cream Place 1 applicator vaginally at bedtime. Apply nightly for three nights. 08/01/22   Constant, Peggy, MD  triamcinolone cream (KENALOG) 0.1 % Apply 1 application topically 2 (two) times daily. 05/27/21   Wallis Bamberg, PA-C  Vitamin D, Ergocalciferol, (DRISDOL) 1.25 MG (50000 UNIT) CAPS capsule Take 50,000 Units by mouth once a week. Patient not taking: Reported on 08/31/2023 03/24/22   [provider]  zolpidem (AMBIEN) 10 MG tablet Take 1 tablet (10 mg total) by mouth at bedtime as needed for sleep 03/20/21         Allergies    Coreg [carvedilol], Strawberry extract, Imdur [isosorbide nitrate], Amoxicillin, Dilaudid [hydromorphone hcl], Ibuprofen, Latex, Metoprolol, and Coconut flavoring agent (non-screening)    Review of Systems   Review of Systems  HENT:  Positive for congestion, dental problem and ear pain.   All other systems reviewed and are negative.   Physical Exam Updated Vital Signs BP 123/76   Pulse 76   Temp 98.3 F (36.8 C)   Resp (!) 21   Ht 5\' 1"  (1.549 m)   Wt 100 kg   LMP  (LMP Unknown)   SpO2 98%   BMI 41.66 kg/m  Physical Exam Vitals and nursing note reviewed.   Gen: NAD Eyes: PERRL, EOMI HEENT: no oropharyngeal swelling, posterior oropharynx is mildly erythematous with no exudates noted, no significant swelling noted,, normal-appearing TMs bilaterally Neck: trachea midline, no stridor, no fullness of the anterior neck noted Resp: clear to auscultation bilaterally Card: RRR,  no murmurs, rubs, or gallops Abd: nontender, nondistended Extremities: no calf tenderness, no edema Vascular: 2+ radial pulses bilaterally, 2+ DP pulses bilaterally Skin: no rashes Psyc: acting appropriately   ED Results / Procedures / Treatments   Labs (all labs ordered are listed, but only abnormal results are displayed) Labs Reviewed  BASIC METABOLIC PANEL WITH GFR - Abnormal; Notable for the following components:      Result Value   Glucose, Bld 103 (*)    Creatinine, Ser 1.25 (*)    GFR, Estimated 49 (*)    All other components within normal limits  CBC - Abnormal; Notable for the following components:   RDW 16.8 (*)    All other components within normal limits  TROPONIN I (HIGH SENSITIVITY) - Abnormal; Notable for the following components:   Troponin I (High Sensitivity) 24 (*)    All other components within normal limits  TROPONIN I (HIGH SENSITIVITY) -  Abnormal; Notable for the following components:   Troponin I (High Sensitivity) 22 (*)    All other components within normal limits  RESP PANEL BY RT-PCR (RSV, FLU A&B, COVID)  RVPGX2  GROUP A STREP BY PCR  MAGNESIUM    EKG None  Radiology DG Chest 1 View Result Date: 02/27/2024 CLINICAL DATA:  Cough. EXAM: CHEST  1 VIEW COMPARISON:  November 25, 2022. FINDINGS: Stable cardiomediastinal silhouette. Both lungs are clear. The visualized skeletal structures are unremarkable. IMPRESSION: No active disease. Electronically Signed   By: Lupita Raider M.D.   On: 02/27/2024 08:17    Procedures Procedures    Medications Ordered in ED Medications  oxyCODONE-acetaminophen (PERCOCET/ROXICET) 5-325 MG per tablet 1 tablet (1 tablet Oral Not Given 02/27/24 0948)  acetaminophen-codeine (TYLENOL #3) 300-30 MG per tablet 1 tablet (has no administration in time range)  doxycycline (VIBRA-TABS) tablet 100 mg (has no administration in time range)    ED Course/ Medical Decision Making/ A&P                                 Medical  Decision Making 63 year old female with past medical history of hypertension, hyperlipidemia, and coronary artery disease presenting to the emergency department today with URI symptoms.  I will further evaluate her here with basic labs as well as an EKG in regards to the episode of lightheadedness earlier this week.  Her exam is reassuring and she does not have any focal deficits suggestive of stroke at this time.  I will reevaluate for ultimate disposition.  The patient's EKG interpreted by me shows a sinus rhythm with a rate of 83 with normal axis, normal intervals with exception of QTc prolongation of 515, no significant ST-T changes.  The patient's workup here is reassuring.  She had 2 troponins that were flat and just barely elevated.  I did call and discussed this with cardiology given the long QTc.  They recommended outpatient follow-up.  The patient be discharged with outpatient follow-up.  She is covered with doxycycline for likely sinusitis.  She tells me that she had nausea and vomiting and hives with amoxicillin so we will avoid this.  She is discharged with return precautions.  Amount and/or Complexity of Data Reviewed Labs: ordered. Radiology: ordered.  Risk Prescription drug management.           Final Clinical Impression(s) / ED Diagnoses Final diagnoses:  Sinusitis, unspecified chronicity, unspecified location  Long QT interval    Rx / DC Orders ED Discharge Orders          Ordered    Ambulatory referral to Cardiology       Comments: If you have not heard from the Cardiology office within the next 72 hours please call [phone].   02/27/24 1253    doxycycline (VIBRAMYCIN) 100 MG capsule  2 times daily        02/27/24 1253              Durwin Glaze, MD 02/27/24 1254

## 2024-02-27 NOTE — ED Notes (Signed)
 Nt took Pt for and walk. Pt stated that she was light headed when walking.

## 2024-02-27 NOTE — ED Triage Notes (Signed)
 Patient BIB EMS from home. Patient c/o bilateral ear pain, L sided body swelling, hypertension, and seeing floaters in her eye sight.

## 2024-02-27 NOTE — Discharge Instructions (Addendum)
 Please take the doxycycline which is to help for your sinus infection.  Take Tylenol at home as needed for pain.  Follow-up with your doctor.  I did place a referral to our cardiologist for further evaluation as an outpatient.  Return to the ER for worsening symptoms.

## 2024-02-27 NOTE — ED Notes (Signed)
 Patient transported to X-ray

## 2024-05-10 ENCOUNTER — Other Ambulatory Visit (HOSPITAL_COMMUNITY): Payer: Self-pay | Admitting: Cardiovascular Disease

## 2024-05-23 ENCOUNTER — Ambulatory Visit: Admitting: Cardiovascular Disease

## 2024-06-06 ENCOUNTER — Other Ambulatory Visit (HOSPITAL_COMMUNITY): Payer: Self-pay | Admitting: Cardiovascular Disease

## 2024-06-07 ENCOUNTER — Ambulatory Visit: Attending: Cardiovascular Disease | Admitting: Cardiovascular Disease

## 2024-06-07 ENCOUNTER — Encounter: Payer: Self-pay | Admitting: Cardiovascular Disease

## 2024-06-07 VITALS — BP 112/70 | HR 72 | Ht 61.0 in | Wt 215.0 lb

## 2024-06-07 DIAGNOSIS — Z9861 Coronary angioplasty status: Secondary | ICD-10-CM | POA: Diagnosis present

## 2024-06-07 DIAGNOSIS — I2510 Atherosclerotic heart disease of native coronary artery without angina pectoris: Secondary | ICD-10-CM | POA: Diagnosis not present

## 2024-06-07 DIAGNOSIS — I10 Essential (primary) hypertension: Secondary | ICD-10-CM | POA: Insufficient documentation

## 2024-06-07 DIAGNOSIS — I5021 Acute systolic (congestive) heart failure: Secondary | ICD-10-CM | POA: Insufficient documentation

## 2024-06-07 DIAGNOSIS — E785 Hyperlipidemia, unspecified: Secondary | ICD-10-CM | POA: Insufficient documentation

## 2024-06-07 NOTE — Progress Notes (Signed)
 06/07/2024 Tracey Manning   05-25-1961  [id number]  Primary Physician Shelda Atlas, MD Primary Cardiologist: Dorn [name] [name], [name]  HPI:  Tracey Manning is a 63 y.o.  moderately overweight single [place] American female mother of 2 children, grandmother and 2 grandchildren who currently is out of work on [employer] and disability. I last saw her in the office 09/29/2022.She was referred by Dr. Peder for cardiovascular evaluation because of chest pain. Her cardiac risk factor profile is positive for 20-pack-years of tobacco abuse currently smoking one pack per day, treated type of pressure and high cholesterol. She has had multiple heart attacks in the past dating back to 2004. She had a stent placed in her circumflex by Dr. Ladona December 2008 (Taxus 2.5 x 24 mm). She had a Medtronic stent placed in her proximal LAD by Dr. Burnard 06/11/11. Over the last 6 months she has developed recurrent chest pain as well as dyspnea on exertion. Since I saw her on 04/04/14 I obtained a Myoview stress test which was normal and a 2-D echo which revealed normal LV function. Since I saw her a year ago, she's done well clinically. She gets occasional chest pain which has not changed in frequency or severity. Her blood pressure is somewhat elevated due to back and abdominal pain however.   She was admitted in July with chest pain and shortness of breath.  Cardiac cath performed Dr. Claudene revealed an occluded RCA with left-to-right collaterals, moderate AV groove circumflex disease, patent proximal LAD stent with high-grade disease just proximal to this.  After she was evaluated by cardiothoracic surgery and turned down for bypass grafting I performed ostial/proximal LAD intervention 06/19/2019 with a 3.5 mm x 12 mm long Synergy drug-eluting stent.   Since I saw her in the office a year and a half ago she continues to do well.  She is did see Damien Boos, FNP last year.  She was seeing Dr.  Bensimhon in the past in the heart failure clinic.  Her EF has improved up to 50 to 55% by 2D echo 04/30/2022.  She is otherwise asymptomatic and denies chest pain or shortness of breath.  She still unfortunately continues to smoke 5 cigarettes a day.   Current Meds  Medication Sig   acetaminophen  (TYLENOL ) 500 MG tablet Take 1,500 mg by mouth daily as needed (pain).   albuterol  (PROVENTIL  HFA) 108 (90 Base) MCG/ACT inhaler Inhale 2 puffs by mouth every 4 hours as needed   albuterol  (PROVENTIL ) (2.5 MG/3ML) 0.083% nebulizer solution INHALE 1 VIAL VIA NEBULIZER EVERY 6 HOURS AS NEEDED (Patient taking differently: Take 2.5 mg by nebulization every 6 (six) hours as needed for wheezing or shortness of breath.)   aspirin  81 MG EC tablet Take 1 tablet (81 mg total) by mouth daily.   atorvastatin  (LIPITOR ) 80 MG tablet Take 1 tablet (80 mg total) by mouth daily.   cetirizine  (ZYRTEC ) 10 MG tablet TAKE 1 TABLET BY MOUTH ONCE A DAY AS NEEDED (Patient taking differently: Take 10 mg by mouth daily as needed for allergies.)   clindamycin  (CLEOCIN ) 100 MG vaginal suppository Place 1 suppository (100 mg total) vaginally at bedtime.   clopidogrel  (PLAVIX ) 75 MG tablet TAKE 1 TABLET (75 MG TOTAL) BY MOUTH DAILY (AM) (Patient taking differently: Take 75 mg by mouth daily.)   diclofenac  Sodium (VOLTAREN ) 1 % GEL Apply 4 g topically 4 (four) times daily as needed (pain).   doxycycline  (VIBRAMYCIN ) 100 MG capsule  Take 1 capsule (100 mg total) by mouth 2 (two) times daily.   ENTRESTO  97-103 MG TAKE 1 TABLET BY MOUTH 2 (TWO) TIMES DAILY (AM+BEDTIME) (Patient taking differently: Take 1 tablet by mouth 2 (two) times daily.)   Fluticasone -Umeclidin-Vilant (TRELEGY ELLIPTA) 200-62.5-25 MCG/INH AEPB Inhale 1 puff into the lungs daily as needed (wheezing/sob).   nitroGLYCERIN  (NITROSTAT ) 0.4 MG SL tablet DISSOLVE 1 TABLET UNDER THE TONGUE AS NEEDED AS DIRECTED (Patient taking differently: Place 0.4 mg under the tongue every 5  (five) minutes as needed for chest pain.)   potassium chloride  (KLOR-CON ) 10 MEQ tablet TAKE 2 TABLETS BY MOUTH 2 (TWO) TIMES DAILY. (2AM+2BEDTIME)   pregabalin  (LYRICA ) 100 MG capsule Take 100 mg by mouth 2 (two) times daily.   spironolactone  (ALDACTONE ) 25 MG tablet TAKE 1 TABLET (25 MG TOTAL) BY MOUTH DAILY. (AM)   terconazole  (TERAZOL 3 ) 0.8 % vaginal cream Place 1 applicator vaginally at bedtime. Apply nightly for three nights.   terconazole  (TERAZOL 3 ) 0.8 % vaginal cream Place 1 applicator vaginally at bedtime. Apply nightly for three nights.   triamcinolone  cream (KENALOG ) 0.1 % Apply 1 application topically 2 (two) times daily.   zolpidem  (AMBIEN ) 10 MG tablet Take 1 tablet (10 mg total) by mouth at bedtime as needed for sleep     Allergies  Allergen Reactions   Coreg  [Carvedilol ] Other (See Comments)    Headaches/migraine   Strawberry Extract Anaphylaxis, Hives, Swelling and Other (See Comments)    Fresh strawberries made the throat swell (2014)   Imdur  [Isosorbide  Nitrate] Other (See Comments)    Headaches    Amoxicillin Nausea And Vomiting    Did it involve swelling of the face/tongue/throat, SOB, or low BP? Yes Did it involve sudden or severe rash/hives, skin peeling, or any reaction on the inside of your mouth or nose? Yes Did you need to seek medical attention at a hospital or doctor's office? Yes When did it last happen?      within last 10 years If all above answers are NO, may proceed with cephalosporin use.    Dilaudid  [Hydromorphone  Hcl] Nausea And Vomiting   Ibuprofen  Other (See Comments)    Wheezing    Latex Swelling and Other (See Comments)    No reaction with Band-Aids, though   Metoprolol  Other (See Comments)    Headaches and wheezing   Coconut Flavoring Agent (Non-Screening) Hives and Swelling    Throat swelling    Social History   Socioeconomic History   Marital status: Divorced    Spouse name: Not on file   Number of children: 2   Years of  education: 12   Highest education level: High school graduate  Occupational History   Not on file  Tobacco Use   Smoking status: Every Day    Current packs/day: 0.50    Average packs/day: 0.5 packs/day for 34.0 years (17.0 ttl pk-yrs)    Types: Cigarettes   Smokeless tobacco: Never  Vaping Use   Vaping status: Never Used  Substance and Sexual Activity   Alcohol use: Not Currently    Comment: seldom   Drug use: Yes    Types: Marijuana    Comment: 06/19/19   Sexual activity: Yes    Partners: Male    Birth control/protection: Post-menopausal  Other Topics Concern   Not on file  Social History Narrative   Not on file   Social Drivers of Health   Financial Resource Strain: Medium Risk (03/07/2021)   Overall Financial Resource Strain (  CARDIA)    Difficulty of Paying Living Expenses: Somewhat hard  Food Insecurity: Low Risk  (02/29/2024)   Received from [facility]   Hunger Vital Sign    Within the past 12 months, you worried that your food would run out before you got money to buy more: Never true    Within the past 12 months, the food you bought just didn't last and you didn't have money to get more. : Never true  Transportation Needs: No Transportation Needs (02/29/2024)   Received from [facility]    In the past 12 months, has lack of reliable transportation kept you from medical appointments, meetings, work or from getting things needed for daily living? : No  Physical Activity: Inactive (07/25/2019)   Exercise Vital Sign    Days of Exercise per Week: 0 days    Minutes of Exercise per Session: 0 min  Stress: Stress Concern Present (07/25/2019)   [employer] of Occupational Health - Occupational Stress Questionnaire    Feeling of Stress : To some extent  Social Connections: Unknown (04/05/2022)   Received from [facility]   Social Network    Social Network: Not on file  Intimate Partner Violence: Unknown (02/25/2022)   Received from [facility]    HITS    Physically Hurt: Not on file    Insult or Talk Down To: Not on file    Threaten Physical Harm: Not on file    Scream or Curse: Not on file     Review of Systems: General: negative for chills, fever, night sweats or weight changes.  Cardiovascular: negative for chest pain, dyspnea on exertion, edema, orthopnea, palpitations, paroxysmal nocturnal dyspnea or shortness of breath Dermatological: negative for rash Respiratory: negative for cough or wheezing Urologic: negative for hematuria Abdominal: negative for nausea, vomiting, diarrhea, bright red blood per rectum, melena, or hematemesis Neurologic: negative for visual changes, syncope, or dizziness All other systems reviewed and are otherwise negative except as noted above.    Blood pressure 112/70, pulse 72, height 5' 1 (1.549 m), weight 215 lb (97.5 kg), SpO2 97%.  General appearance: alert and no distress Neck: no adenopathy, no carotid bruit, no JVD, supple, symmetrical, trachea midline, and thyroid not enlarged, symmetric, no tenderness/mass/nodules Lungs: clear to auscultation bilaterally Heart: regular rate and rhythm, S1, S2 normal, no murmur, click, rub or gallop Extremities: extremities normal, atraumatic, no cyanosis or edema Pulses: 2+ and symmetric Skin: Skin color, texture, turgor normal. No rashes or lesions Neurologic: Grossly normal  EKG not performed today      ASSESSMENT AND PLAN:   Essential hypertension History of essential hypertension blood pressure measured today at 112/70.  She is on Entresto  but not on a beta-blocker.  Dyslipidemia, goal LDL below 70 History of dyslipidemia on high-dose atorvastatin  with lipid profile performed 12/18/2022 revealing total cholesterol 120, LDL 57 HDL 40.    CAD S/P multiple PCIs History of CAD status post multiple stents in the past dating back to 2004.  She had circumflex stenting by Dr. Ladona in 2008, Dr. Burnard placed approximately the stent 06/11/2011.  I  intervened on her 06/19/2019 and placed a Synergy stent in the ostium/proximal LAD after she was turned down for bypass grafting.  She has remained asymptomatic.  Acute systolic CHF (congestive heart failure) (HCC) History of LV dysfunction.  She has seen Dr. Bensimhon in the past.  She is intolerant to carvedilol , metoprolol  and Imdur .  She is on Entresto  and spironolactone .  Her most recent echo for 04/30/2022 revealed EF of 50 to 55% with grade 1 diastolic dysfunction and no significant valvular abnormalities.     Dorn DOROTHA Lesches MD FACP,FACC,FAHA, [facility] 06/07/2024 11:53 AM

## 2024-06-07 NOTE — Patient Instructions (Signed)
 Medication Instructions:  Your physician recommends that you continue on your current medications as directed. Please refer to the Current Medication list given to you today.  *If you need a refill on your cardiac medications before your next appointment, please call your pharmacy*  Lab Work: Your physician recommends that you return for lab work in: the next week or 2 for FASTING Lipid/liver panel  If you have labs (blood work) drawn today and your tests are completely normal, you will receive your results only by: MyChart Message (if you have MyChart) OR A paper copy in the mail If you have any lab test that is abnormal or we need to change your treatment, we will call you to review the results.   Follow-Up: At [facility], you and your health needs are our priority.  As part of our continuing mission to provide you with exceptional heart care, our providers are all part of one team.  This team includes your primary Cardiologist (physician) and Advanced Practice Providers or APPs (Physician Assistants and Nurse Practitioners) who all work together to provide you with the care you need, when you need it.  Your next appointment:   12 month(s)  Provider:   Damien Braver, NP         Then, Dorn Lesches, MD will plan to see you again in 2 year(s).    We recommend signing up for the patient portal called MyChart.  Sign up information is provided on this After Visit Summary.  MyChart is used to connect with patients for Virtual Visits (Telemedicine).  Patients are able to view lab/test results, encounter notes, upcoming appointments, etc.  Non-urgent messages can be sent to your provider as well.   To learn more about what you can do with MyChart, go to [email or web address].

## 2024-06-07 NOTE — Assessment & Plan Note (Signed)
 History of essential hypertension blood pressure measured today at 112/70.  She is on Entresto  but not on a beta-blocker.

## 2024-06-07 NOTE — Assessment & Plan Note (Signed)
 History of CAD status post multiple stents in the past dating back to 2004.  She had circumflex stenting by Dr. Ladona in 2008, Dr. Burnard placed approximately the stent 06/11/2011.  I intervened on her 06/19/2019 and placed a Synergy stent in the ostium/proximal LAD after she was turned down for bypass grafting.  She has remained asymptomatic.

## 2024-06-07 NOTE — Assessment & Plan Note (Signed)
 History of dyslipidemia on high-dose atorvastatin  with lipid profile performed 12/18/2022 revealing total cholesterol 120, LDL 57 HDL 40.

## 2024-06-07 NOTE — Assessment & Plan Note (Signed)
 History of LV dysfunction.  She has seen Dr. Bensimhon in the past.  She is intolerant to carvedilol , metoprolol  and Imdur .  She is on Entresto  and spironolactone .  Her most recent echo for 04/30/2022 revealed EF of 50 to 55% with grade 1 diastolic dysfunction and no significant valvular abnormalities.

## 2024-06-18 ENCOUNTER — Encounter (HOSPITAL_COMMUNITY): Payer: Self-pay | Admitting: *Deleted

## 2024-06-18 ENCOUNTER — Emergency Department (HOSPITAL_COMMUNITY)

## 2024-06-18 ENCOUNTER — Emergency Department (HOSPITAL_COMMUNITY)
Admission: EM | Admit: 2024-06-18 | Discharge: 2024-06-18 | Disposition: A | Discharge status: Home / Self Care | Attending: Emergency Medicine | Admitting: Emergency Medicine

## 2024-06-18 DIAGNOSIS — I2510 Atherosclerotic heart disease of native coronary artery without angina pectoris: Secondary | ICD-10-CM | POA: Insufficient documentation

## 2024-06-18 DIAGNOSIS — I509 Heart failure, unspecified: Secondary | ICD-10-CM | POA: Insufficient documentation

## 2024-06-18 DIAGNOSIS — R079 Chest pain, unspecified: Secondary | ICD-10-CM | POA: Insufficient documentation

## 2024-06-18 LAB — BASIC METABOLIC PANEL WITH GFR
Anion gap: 11 (ref 5–15)
BUN: 13 mg/dL (ref 8–23)
CO2: 23 mmol/L (ref 22–32)
Calcium: 9.2 mg/dL (ref 8.9–10.3)
Chloride: 103 mmol/L (ref 98–111)
Creatinine, Ser: 1.07 mg/dL — ABNORMAL HIGH (ref 0.44–1.00)
GFR, Estimated: 59 mL/min — ABNORMAL LOW (ref >60)
Glucose, Bld: 100 mg/dL — ABNORMAL HIGH (ref 70–99)
Potassium: 4.3 mmol/L (ref 3.5–5.1)
Sodium: 137 mmol/L (ref 135–145)

## 2024-06-18 LAB — CBC
HCT: 37.3 % (ref 36.0–46.0)
Hemoglobin: 12.7 g/dL (ref 12.0–15.0)
MCH: 28.7 pg (ref 26.0–34.0)
MCHC: 34 g/dL (ref 30.0–36.0)
MCV: 84.2 fL (ref 80.0–100.0)
Platelets: 190 K/uL (ref 150–400)
RBC: 4.43 MIL/uL (ref 3.87–5.11)
RDW: 16.1 % — ABNORMAL HIGH (ref 11.5–15.5)
WBC: 6.8 K/uL (ref 4.0–10.5)
nRBC: 0 % (ref 0.0–0.2)

## 2024-06-18 LAB — TROPONIN I (HIGH SENSITIVITY)
Troponin I (High Sensitivity): 8 ng/L (ref <18)
Troponin I (High Sensitivity): 9 ng/L (ref <18)

## 2024-06-18 LAB — BRAIN NATRIURETIC PEPTIDE: B Natriuretic Peptide: 23.2 pg/mL (ref 0.0–100.0)

## 2024-06-18 MED ORDER — AZITHROMYCIN 250 MG PO TABS: 1000.0000 mg | ORAL_TABLET | Freq: Once | ORAL | Status: DC

## 2024-06-18 MED ORDER — MORPHINE SULFATE (PF) 4 MG/ML IV SOLN
4.0000 mg | Freq: Once | INTRAVENOUS | Status: AC
  Administered 2024-06-18: 4 mg via INTRAVENOUS
  Filled 2024-06-18: qty 1

## 2024-06-18 MED ORDER — CEFTRIAXONE SODIUM 1 G IJ SOLR
1.0000 g | Freq: Once | INTRAMUSCULAR | Status: DC
Start: 2024-06-18 — End: 2024-06-18

## 2024-06-18 NOTE — ED Triage Notes (Signed)
 Pt arrives via [facility] from home, went to sit down, sudden onset of chest pain, center, sharp, non radiating. Worse to deep breath. Previous stent placement. Took 1tab nitroglycerin  prior to EMS arrival without relief.  324 aspirin  given by EMS. NSR 70's with PVC's, 160/100, hx of CHF.

## 2024-06-18 NOTE — ED Provider Notes (Signed)
 [facility] Emergency Department Provider Note MRN:  [id number]  Arrival date & time: 06/18/24     Chief Complaint   Chest Pain   History of Present Illness   Tracey Manning is a 63 y.o. year-old female presents to the ED with chief complaint of chest pain.  Patient states that she was up watching a movie when the pain started suddenly.  She describes it as sharp and stabbing and on the right side of her chest.  She reports some associated shortness of breath, and states that it is hard to take a deep breath.  She tried taking nitroglycerin  without any relief.  She has had aspirin  tonight.  She has history of CHF as well as prior CAD with stents.  She still complains of severe central chest pain.  She denies fever, chills, cough..  History provided by patient.   Review of Systems  Pertinent positive and negative review of systems noted in HPI.    Physical Exam   Vitals:   06/18/24 0431 06/18/24 0433  BP: (!) 144/73   Pulse: 66 67  Resp: 18 19  Temp: 99.1 F (37.3 C)   SpO2:  100%    CONSTITUTIONAL:  non toxic-appearing, NAD NEURO:  Alert and oriented x 3, CN 3-12 grossly intact EYES:  eyes equal and reactive ENT/NECK:  Supple, no stridor  CARDIO:  normal rate, regular rhythm, appears well-perfused  PULM:  No respiratory distress, CTAB GI/GU:  non-distended,  MSK/SPINE:  No gross deformities, no edema, moves all extremities  SKIN:  no rash, atraumatic   *Additional and/or pertinent findings included in MDM below  Diagnostic and Interventional Summary    EKG Interpretation Date/Time:  Sunday June 18 2024 04:32:06 EDT Ventricular Rate:  58 PR Interval:  186 QRS Duration:  112 QT Interval:  452 QTC Calculation: 444 R Axis:   65  Text Interpretation: Sinus rhythm Borderline intraventricular conduction delay Low voltage with right axis deviation When compared with ECG of 11/26/2022, No significant change was found Confirmed by Raford Lenis  ([id number]) on 06/18/2024 5:10:42 AM       Labs Reviewed  BASIC METABOLIC PANEL WITH GFR - Abnormal; Notable for the following components:      Result Value   Glucose, Bld 100 (*)    Creatinine, Ser 1.07 (*)    GFR, Estimated 59 (*)    All other components within normal limits  CBC - Abnormal; Notable for the following components:   RDW 16.1 (*)    All other components within normal limits  BRAIN NATRIURETIC PEPTIDE  TROPONIN I (HIGH SENSITIVITY)  TROPONIN I (HIGH SENSITIVITY)    DG Chest 2 View  Final Result      Medications  morphine  (PF) 4 MG/ML injection 4 mg (4 mg Intravenous Given 06/18/24 0457)     Procedures  /  Critical Care Procedures  ED Course and Medical Decision Making  I have reviewed the triage vital signs, the nursing notes, and pertinent available records from the EMR.  Social Determinants Affecting Complexity of Care: Patient has no clinically significant social determinants affecting this chief complaint..   ED Course: Clinical Course as of 06/18/24 0608  Sun Jun 18, 2024  0606 Troponin I (High Sensitivity) First troponin is 8, repeat is due at 0700.  Will continue to monitor. [RB]  0607 Basic metabolic panel(!) No significant electrolyte abnormality. [RB]  0607 CBC(!) No leukocytosis or anemia. [RB]  0607 Brain natriuretic peptide BNP iw pending.  [RB]  9392 DG Chest 2 View No obvious infiltrate or edema. [RB]    Clinical Course User Index [RB] Vicky Charleston, PA-C    Medical Decision Making Amount and/or Complexity of Data Reviewed Labs: ordered. Decision-making details documented in ED Course. Radiology: ordered. Decision-making details documented in ED Course.  Risk Prescription drug management.         Consultants:    Treatment and Plan: Patient signed out to oncoming team, who will continue care.  Plan: Follow 2nd troponin and reassess.  If CP free and trops are negative, could consider discharge home with cardiology  follow-up.     Final Clinical Impressions(s) / ED Diagnoses  No diagnosis found.  ED Discharge Orders     None         Discharge Instructions Discussed with and Provided to Patient:   Discharge Instructions   None      Vicky Charleston, PA-C 06/18/24 9390    Raford Lenis, MD 06/18/24 [phone]

## 2024-06-18 NOTE — ED Provider Notes (Signed)
 Accepted handoff at shift change from [employer]. Please see prior provider note for more detail.   Briefly: Patient is 63 y.o. presents today for sudden onset central chest pain.  Patient has a history to heart stents and CHF.  DDX: concern for STEMI, NSTEMI, arrhythmia, electrolyte abnormality  Plan: Delta troponin which will determine disposition, likely discharge with cardiology follow-up.  Physical Exam  BP (!) 144/73 (BP Location: Right Arm)   Pulse 67   Temp 99.1 F (37.3 C) (Oral)   Resp 19   Ht 5' 1 (1.549 m)   Wt 97.5 kg   LMP  (LMP Unknown)   SpO2 100%   BMI 40.62 kg/m   Physical Exam  Procedures  Procedures  ED Course / MDM   Clinical Course as of 06/18/24 0758  Sun Jun 18, 2024  0606 Troponin I (High Sensitivity) First troponin is 8, repeat is due at 0700.  Will continue to monitor. [RB]  0607 Basic metabolic panel(!) No significant electrolyte abnormality. [RB]  0607 CBC(!) No leukocytosis or anemia. [RB]  0607 Brain natriuretic peptide BNP iw pending.  [RB]  0607 DG Chest 2 View No obvious infiltrate or edema. [RB]    Clinical Course User Index [RB] Vicky Charleston, PA-C   Medical Decision Making Amount and/or Complexity of Data Reviewed Labs: ordered. Decision-making details documented in ED Course. Radiology: ordered. Decision-making details documented in ED Course.  Risk Prescription drug management.   Delta troponins are flat.  Considered for admission further workup however patient's vital signs, physical exam, labs, and imaging and been reassuring.  Patient to follow-up with cardiology for further evaluation workup.  Patient given return precautions.  I feel patient safe for discharge at this time.     Francis Ileana SAILOR, PA-C 06/18/24 0758    Neysa Caron PARAS, DO 06/18/24 [phone]

## 2024-06-18 NOTE — Discharge Instructions (Signed)
 Today you were seen for chest pain.  Your workup while in the ED was reassuring, please follow-up with your cardiologist as soon as possible for further evaluation workup.  Please return to the ED if you have worsening pain, pain radiating up your jaw or down your left arm, or sudden shortness of breath.  Thank you for letting us  treat you today. After reviewing your labs and imaging, I feel you are safe to go home. Please follow up with your PCP in the next several days and provide them with your records from this visit. Return to the Emergency Room if pain becomes severe or symptoms worsen.

## 2024-07-07 LAB — AMB RESULTS CONSOLE CBG: Glucose: 186

## 2024-07-07 NOTE — Progress Notes (Signed)
 No SDOH needs

## 2024-08-18 ENCOUNTER — Encounter: Payer: Self-pay | Admitting: Gastroenterology

## 2024-08-23 ENCOUNTER — Telehealth: Payer: Self-pay

## 2024-08-23 NOTE — Telephone Encounter (Signed)
   Name: Tracey Manning  DOB: [birth year]  MRN: [id number]   Primary Cardiologist: Dorn Lesches, MD  Chart reviewed as part of pre-operative protocol coverage.   Case discussed with Dr. Lesches: Pt has extensive CAD and stenting history, including ostial LAD. OK to hold plavix , but continue ASA throughout perioperative procedure. Restart plavix  after procedure. She may proceed with tooth extractions without additional cardiology workup.   Therefore, based on ACC/AHA guidelines, the patient would be at acceptable risk for the planned procedure without further cardiovascular testing.    I will route this recommendation to the requesting party via Epic fax function and remove from pre-op pool. Please call with questions.  Jon Garre Lev Cervone, PA 08/23/2024, 1:03 PM

## 2024-08-23 NOTE — Telephone Encounter (Signed)
   Pre-operative Risk Assessment    Patient Name: Tracey Manning  DOB: 03-14-1961 MRN: [id number]   Date of last office visit: 06/07/24 DORN LESCHES, MD Date of next office visit: NONE  Request for Surgical Clearance    Procedure:  Dental Extraction - Amount of Teeth to be Pulled:  13 TEETH WITH ALVEOLOPLASTY  Date of Surgery:  Clearance TBD                                Surgeon:  GLENDIA CHRISTELLA PRIMROSE, DMD, PA Surgeon's Group or Practice Name:  GLENDIA CHRISTELLA PRIMROSE, DMD, PA [facility] Phone number:  [phone] Fax number:  [phone]   Type of Clearance Requested:   - Medical  - Pharmacy:  Hold Aspirin  and Clopidogrel  (Plavix )     Type of Anesthesia:  General    Additional requests/questions:    Signed, Lucie DELENA Ku   08/23/2024, 8:23 AM

## 2024-08-23 NOTE — Telephone Encounter (Signed)
 Dr. Court,  You saw this patient on 06/07/24 who has extensive stenting dating back to 2004, including ostial LAD, and was turned down for bypass surgery. She also has a history of cardiomyopathy, last echo 04/2022 with LVEF 50-55%.  We are now asked for preoperative risk evaluation prior to 13 tooth extractions and to hold ASA and plavix .   Since you saw her on 06/07/24, she had an ER visit 06/18/24 for chest pain, ruled out with negative troponin. She has not been seen back in the office and does not have any ischemic evaluation planned.  Given her long history of CAD/PCI, do you recommend holding plavix  5-7 days while continuing ASA for tooth extractions?   Does she need further workup prior to dental procedure?  Thanks Angie

## 2024-09-08 NOTE — Progress Notes (Unsigned)
 The patient attended a screening event on 8/152025, where her blood pressure was measured at 135/92 mmHg, and her non-fasting blood glucose was 185 mg/dl, placing her in the prediabetic range. During the event, the patient reported that she does smoke, has Medicaid for her insurance, is established with a primary care provider (Dr. Aliene Colon), and has no SDOH needs indicated at this time.   A chart review indicates Dr. Aliene Colon - [facility] as her PCP. There are currently no [facility] visible encounters with her PCP in the last 12 months.   Call Attempt #1: CHW asked pt if she is still being seen by her PCP. Pt shared she is still being seen by her PCP and has an appt on Nov 11 or 19, she was not looking at the calendar to remember. She shared that her PCP is aware of her results. CHW asked pt if she would be interested in education for bp, blood glucose, smoking cessation, and Healthy Cooking Class. Pt is interested in education materials but would not be able to come to cooking class due to mobility and is aware of medical transportation and other transportation resources. CHW included [facility]  medicaid transportation Equ[employer] trader with instructions to book transportation to Cooking Class.   CHW sent courtesy letter with bp, blood glucose, smoking cessation education, and Healthy Cooking Class.  At this time, no additional support from the Health [employer] Team is indicated.

## 2024-10-16 ENCOUNTER — Encounter (HOSPITAL_COMMUNITY): Payer: Self-pay | Admitting: Oral Surgery

## 2024-10-16 ENCOUNTER — Other Ambulatory Visit: Payer: Self-pay

## 2024-10-16 NOTE — Anesthesia Preprocedure Evaluation (Signed)
 Anesthesia Evaluation  Patient identified by MRN, date of birth, ID band Patient awake    Reviewed: Allergy & Precautions, NPO status , Patient's Chart, lab work & pertinent test results  History of Anesthesia Complications Negative for: history of anesthetic complications  Airway Mallampati: II  TM Distance: >3 FB Neck ROM: Full    Dental no notable dental hx. (+) Poor Dentition, Dental Advisory Given   Pulmonary asthma , sleep apnea and Continuous Positive Airway Pressure Ventilation , COPD, Current Smoker and Patient abstained from smoking.   Pulmonary exam normal breath sounds clear to auscultation       Cardiovascular hypertension, + CAD, + Past MI (Multiple dating back to 2004), + Cardiac Stents and +CHF  Normal cardiovascular exam Rhythm:Regular Rate:Normal  Echo 04/30/2022: IMPRESSIONS   1. Left ventricular ejection fraction, by estimation, is 50 to 55%. The  left ventricle has low normal function. The left ventricle has no regional  wall motion abnormalities. Left ventricular diastolic parameters are  consistent with Grade I diastolic  dysfunction (impaired relaxation).   2. Right ventricular systolic function is normal. The right ventricular  size is normal. There is normal pulmonary artery systolic pressure. The  estimated right ventricular systolic pressure is 21.0 mmHg.   3. The mitral valve is grossly normal. Trivial mitral valve  regurgitation. No evidence of mitral stenosis.   4. The aortic valve is tricuspid. There is mild calcification of the  aortic valve. There is mild thickening of the aortic valve. Aortic valve  regurgitation is not visualized. Aortic valve sclerosis/calcification is  present, without any evidence of  aortic stenosis.   5. The inferior vena cava is normal in size with greater than 50%  respiratory variability, suggesting right atrial pressure of 3 mmHg.  - Comparison(s): Compared to prior  TTE in 09/2021, there is no significant  change.     Neuro/Psych  Neuromuscular disease    GI/Hepatic ,GERD  ,,  Endo/Other    Class 3 obesity  Renal/GU Lab Results      Component                Value               Date                           K                        3.9                 10/18/2024                CO2                      23                  10/18/2024                BUN                      12                  10/18/2024                CREATININE               1.31 (H)  10/18/2024                GFRNONAA                 46 (L)              10/18/2024                  Musculoskeletal  (+) Arthritis ,    Abdominal   Peds  Hematology On Plavix    Lab Results      Component                Value               Date                      WBC                      6.9                 10/18/2024                HGB                      12.9                10/18/2024                HCT                      39.0                10/18/2024                MCV                      87.2                10/18/2024                PLT                      166                 10/18/2024              Anesthesia Other Findings All: latex see list intolerant to carvedilol  and metoprolol   Reproductive/Obstetrics                              Anesthesia Physical Anesthesia Plan  ASA: 4  Anesthesia Plan: General   Post-op Pain Management: Ofirmev  IV (intra-op)*   Induction: Intravenous  PONV Risk Score and Plan: 4 or greater and Treatment may vary due to age or medical condition and Midazolam   Airway Management Planned: Oral ETT and Nasal ETT  Additional Equipment: None  Intra-op Plan:   Post-operative Plan: Extubation in OR  Informed Consent:      Dental advisory given  Plan Discussed with: CRNA and Surgeon  Anesthesia Plan Comments: (PAT note written 10/16/2024 by Isaiah Ruder, PA-C.  )         Anesthesia  Quick Evaluation

## 2024-10-16 NOTE — Progress Notes (Signed)
 PCP - Dr Aliene Colon Cardiologist - Dr Dorn Lesches (clearance 08/23/24)  Chest x-ray - 06/18/24 EKG - 06/18/24 Stress Test - 04/17/14 ECHO - 04/30/22 Cardiac Cath - 06/19/19  ICD Pacemaker/Loop - n/a  Sleep Study -  Yes (08/2018) CPAP - does not use CPAP but uses oxygen  at bedtime  Diabetes - n/a  Plavix  :  Hold Plavix  5-7 days prior to procedure per MD.  Last dose on 10/14/24.  Aspirin  Instructions: Continue per MD  NPO   Anesthesia review: Yes  STOP now taking any Aspirin  (unless otherwise instructed by your surgeon), Aleve , Naproxen , Ibuprofen , Motrin , Advil , Goody's, BC's, all herbal medications, fish oil, and all vitamins.   Coronavirus Screening Do you have any of the following symptoms:  Cough yes/no: No Fever (>100.39F)  yes/no: No Runny nose yes/no: No Sore throat yes/no: No Difficulty breathing/shortness of breath  yes/no: No  Have you traveled in the last 14 days and where? yes/no: No  Patient verbalized understanding of instructions that were given via phone.

## 2024-10-16 NOTE — Progress Notes (Signed)
 Anesthesia Chart Review: Tracey Manning  Case: [id number] Date/Time: 10/18/24 1023   Procedure: DENTAL RESTORATION/EXTRACTIONS   Anesthesia type: General   Pre-op diagnosis: NON RESTORABLE   [facility]: [facility] ROOM 04 / [facility]   Surgeons: Sheryle Hamilton, DMD       DISCUSSION: Patient is a 63 year old female scheduled for the above procedure.  History includes smoking, CAD/chest pain (multiple MI dating back to 2004, Taxus stent to CX 2008, Medtronic stent pLAD 06/11/2011, Synergy stent to ostial-proximal LAD 06/19/2019), chronic systolic CHF, HTN, dyslipidemia, asthma, GERD, OSA (does not use CPAP, uses O2/[place]), oxygen  dependence (2L).   Preoperative cardiology input outlined on 08/23/2024 by Madie Slough, PA. Patient with known CAD with multiple PCI, last in 2020 after not being thought to be a good candidate for CABG. LVEF was newly depressed at that time at 25-30%. She has been intolerant to carvedilol , metoprolol , and Imdur . She has been on Entresto , Lipitor , and spironolactone . LVEF on 04/30/2022 showed LVEF 50-55%, grade 1 DD, no significant valvular abnormalities. She wrote , Case discussed with Dr. Court: Pt has extensive CAD and stenting history, including ostial LAD. OK to hold plavix , but continue ASA throughout perioperative procedure. Restart plavix  after procedure. She may proceed with tooth extractions without additional cardiology workup.    Therefore, based on ACC/AHA guidelines, the patient would be at acceptable risk for the planned procedure without further cardiovascular testing.   Reported last Plavix  10/14/2024. She is continuing ASA.   She is a same day work-up, so further evaluation by her anesthesia team on the day of surgery.    VS: Ht 5' 1 (1.549 m)   Wt 102.1 kg   LMP  (LMP Unknown)   BMI 42.51 kg/m   PROVIDERS: Shelda Atlas, MD is PCP    LABS: For day of surgery as indicated. Most recent labs in [facility] include: Lab Results  Component Value Date   WBC 6.8  06/18/2024   HGB 12.7 06/18/2024   HCT 37.3 06/18/2024   PLT 190 06/18/2024   GLUCOSE 100 (H) 06/18/2024   ALT 12 12/18/2022   AST 11 12/18/2022   NA 137 06/18/2024   K 4.3 06/18/2024   CL 103 06/18/2024   CREATININE 1.07 (H) 06/18/2024   BUN 13 06/18/2024   CO2 23 06/18/2024    IMAGES: CXR 06/18/2024: FINDINGS: The cardio pericardial silhouette is enlarged. The lungs are clear without focal pneumonia, edema, pneumothorax or pleural effusion. No acute bony abnormality. Telemetry leads overlie the chest.  IMPRESSION: Enlargement of the cardiopericardial silhouette without acute cardiopulmonary findings.   EKG: 06/19/2024: Sinus rhythm  Borderline intraventricular conduction delay  Low voltage with right axis deviation  When compared with ECG of 11/26/2022, No significant change was found Confirmed by Raford Lenis ([id number]) on 06/18/2024 5:10:42 AM   CV: Echo 04/30/2022: IMPRESSIONS   1. Left ventricular ejection fraction, by estimation, is 50 to 55%. The  left ventricle has low normal function. The left ventricle has no regional  wall motion abnormalities. Left ventricular diastolic parameters are  consistent with Grade I diastolic  dysfunction (impaired relaxation).   2. Right ventricular systolic function is normal. The right ventricular  size is normal. There is normal pulmonary artery systolic pressure. The  estimated right ventricular systolic pressure is 21.0 mmHg.   3. The mitral valve is grossly normal. Trivial mitral valve  regurgitation. No evidence of mitral stenosis.   4. The aortic valve is tricuspid. There is mild calcification of the  aortic valve. There is mild thickening of the aortic valve. Aortic valve  regurgitation is not visualized. Aortic valve sclerosis/calcification is  present, without any evidence of  aortic stenosis.   5. The inferior vena cava is normal in size with greater than 50%  respiratory variability, suggesting right atrial pressure of 3  mmHg.  - Comparison(s): Compared to prior TTE in 09/2021, there is no significant  change.    Cardiac cath/PCI 06/19/2019 (after 06/16/2019 cath showed occluded RCA with left to right collaterals, severe stenosis pLAD with ISR, moderate multifocal CX disease, newly depressed LVEF 25-30%, but ultimately not felt to be a good candidate for CABG given COPD, moderate pulmonary HTN, smoking, poor functional status): Ost RCA to Prox RCA lesion is 100% stenosed. Previously placed Prox Cx to Mid Cx stent (unknown type) is widely patent. Previously placed Prox LAD to Mid LAD stent (unknown type) is widely patent. Ost LAD lesion is 90% stenosed. Prox LAD lesion is 90% stenosed. A stent was successfully placed. Post intervention, there is a 0% residual stenosis. A stent was successfully placed. Post intervention, there is a 0% residual stenosis. IMPRESSION: Successful ostial/proximal LAD stenting in the setting of severe LV dysfunction and an occluded RCA with left-to-right collaterals after having been turned down for CABG.  Patient will be gently hydrated overnight and discharged home in the morning on DAPT.   Past Medical History:  Diagnosis Date   Anxiety    Arthritis    Asthma    Barrett's esophagus    Chest pain    Collagen vascular disease    Colon polyps    Coronary artery disease    Depression    Dyslipidemia    GERD (gastroesophageal reflux disease)    occ   Hyperlipidemia    Hypertension    Myocardial infarct (HCC) 7995,7987   Oxygen  dependent    2L via    Sciatica    Sleep apnea    no CPAP ordered but using oxygen  at bedtime   Tobacco abuse     Past Surgical History:  Procedure Laterality Date   CARDIAC CATHETERIZATION  10/09/2003   [facility] [place], [place]) - LAD with 30% prox narrowing, 50% stenosis in mid-portion of PLA; RCA with 40% narrowing proximally (Dr. Norleen Chol, [name])   CARDIAC CATHETERIZATION  10/10/2007   70% stenosis in first septal perforator  branch of LAD, 60-70% narrowing in mid LAD, 20% narrowing in mid AV groove Cfx with 80% diffuse narrowing in small distal marginal, total occlusion of mid RCA with L to R collaterals, 90% stenosis diffusely in prox branch of RCA followed by 70% stenosis in secondary curve & 80% stenosis in small marginal branch (Dr. IVAR Sor)   CARDIAC CATHETERIZATION  05/28/2008   normal L main, RCA with 100% prox lesion w/distal filling from LAD collaterals, LAD with 20% prox tubular lesion/ 40% mid LAD lesion/previous stent patent (Dr. DOROTHA Schwalbe)   CARDIAC CATHETERIZATION  09/15/2009   discrete 100% osital RCA lesion, 50% prox LAD lesion, non-obstructive disease in all coronaries (Dr. HILARIO Jester)   CESAREAN SECTION     COLONOSCOPY     COLONOSCOPY W/ POLYPECTOMY     CORONARY ANGIOPLASTY WITH STENT PLACEMENT  10/31/2007   PCI of distal Cfx with 2.25x58mm Taxus Adam DES, 60% narrowing of mid LAD (Dr. IVAR Sor)   CORONARY ANGIOPLASTY WITH STENT PLACEMENT  06/11/2011   PCI of prox-mid LAD with 3x43mm DES Resolute (Dr. IVAR Sor)   CORONARY STENT INTERVENTION N/A  06/19/2019   Procedure: CORONARY STENT INTERVENTION;  Surgeon: Court Dorn PARAS, MD;  Location: [facility] INVASIVE CV LAB;  Service: Cardiovascular;  Laterality: N/A;  LAD   DILATION AND CURETTAGE, DIAGNOSTIC / THERAPEUTIC     DILITATION & CURRETTAGE/HYSTROSCOPY WITH HYDROTHERMAL ABLATION N/A 04/03/2016   Procedure: DILATATION & CURETTAGE/HYSTEROSCOPY WITH HYDROTHERMAL ABLATION;  Surgeon: Carlin DELENA Centers, MD;  Location: [facility] ORS;  Service: Gynecology;  Laterality: N/A;   NM MYOCAR PERF WALL MOTION  08/2009   persantine myoview - normal perfusion in all regions, perfusion defect in anterior region (breast attenuation), EF 52%, low risk scan   RIGHT/LEFT HEART CATH AND CORONARY ANGIOGRAPHY N/A 06/15/2019   Procedure: RIGHT/LEFT HEART CATH AND CORONARY ANGIOGRAPHY;  Surgeon: Claudene Victory ORN, MD;  Location: [facility] INVASIVE CV LAB;  Service: Cardiovascular;  Laterality: N/A;     MEDICATIONS: No current facility-administered medications for this encounter.    acetaminophen  (TYLENOL ) 500 MG tablet   albuterol  (PROVENTIL  HFA) 108 (90 Base) MCG/ACT inhaler   albuterol  (PROVENTIL ) (2.5 MG/3ML) 0.083% nebulizer solution   aspirin  81 MG EC tablet   atorvastatin  (LIPITOR ) 80 MG tablet   benzonatate  (TESSALON ) 100 MG capsule   cetirizine  (ZYRTEC ) 10 MG tablet   diclofenac  Sodium (VOLTAREN ) 1 % GEL   ENTRESTO  97-103 MG   fluticasone  (FLONASE ) 50 MCG/ACT nasal spray   LINZESS 145 MCG CAPS capsule   methocarbamol  (ROBAXIN ) 750 MG tablet   nitroGLYCERIN  (NITROSTAT ) 0.4 MG SL tablet   potassium chloride  (KLOR-CON ) 10 MEQ tablet   pregabalin  (LYRICA ) 150 MG capsule   RESTASIS 0.05 % ophthalmic emulsion   spironolactone  (ALDACTONE ) 25 MG tablet   triamcinolone  cream (KENALOG ) 0.1 %   zolpidem  (AMBIEN ) 10 MG tablet   clopidogrel  (PLAVIX ) 75 MG tablet   Fluticasone -Umeclidin-Vilant (TRELEGY ELLIPTA) 200-62.5-25 MCG/INH AEPB    Isaiah Ruder, PA-C Surgical Short Stay/Anesthesiology [facility] Phone [phone] [facility] Phone [phone] 10/16/2024 1:28 PM

## 2024-10-16 NOTE — H&P (Signed)
  Patient: Tracey Manning   PID: [id number]  DOB: [birth year]  SEX: Female   Self referred   CC: Wants lower teeth out.   Past Medical History:  Heart Attack, High Blood Pressure, Asthma, Hay Fever or Sinus Problems, Snoring, Weed, Smoker, Night Sweats, High Cholesterol, Obese    Medications: Plavix , Gabapentin , Spironolactone , Atorvastatin , Potassium, Entresto , Flonase , Zyrtec     Allergies:     NKDA    Surgeries:   Stent placed, C Section     Social History       Smoking:            Alcohol: Drug use:                             Exam: BMI 42. Edentulous maxilla. Multiple caries teeth # 17, 20 - 30, 32.   No purulence, edema, fluctuance, trismus. Oral cancer screening negative. Pharynx clear. No lymphadenopathy.  Panorex:  Assessment: ASA 3. Non-restorable   teeth # 17, 20 - 30, 32.                Plan: 1. Cardiac consult received. 2. Extraction Teeth # 17, 20, 21, 22, 23, 24, 25, 26, 27, 28, 29 30, 32.  Alveoloplasty.       Hospital Day surgery.                 Rx: n               Risks and complications explained. Questions answered.   Tracey Manning, DMD

## 2024-10-18 ENCOUNTER — Ambulatory Visit (HOSPITAL_COMMUNITY)
Admission: RE | Admit: 2024-10-18 | Discharge: 2024-10-18 | Disposition: A | Discharge status: Home / Self Care | Attending: Oral Surgery | Admitting: Oral Surgery

## 2024-10-18 ENCOUNTER — Other Ambulatory Visit: Payer: Self-pay

## 2024-10-18 ENCOUNTER — Ambulatory Visit (HOSPITAL_BASED_OUTPATIENT_CLINIC_OR_DEPARTMENT_OTHER): Payer: Self-pay | Admitting: Vascular Surgery

## 2024-10-18 ENCOUNTER — Ambulatory Visit (HOSPITAL_COMMUNITY): Payer: Self-pay | Admitting: Vascular Surgery

## 2024-10-18 ENCOUNTER — Encounter (HOSPITAL_COMMUNITY)
Admission: RE | Disposition: A | Discharge status: Home / Self Care | Payer: Self-pay | Source: Home / Self Care | Attending: Oral Surgery

## 2024-10-18 ENCOUNTER — Encounter (HOSPITAL_COMMUNITY): Payer: Self-pay | Admitting: Oral Surgery

## 2024-10-18 DIAGNOSIS — J449 Chronic obstructive pulmonary disease, unspecified: Secondary | ICD-10-CM | POA: Diagnosis not present

## 2024-10-18 DIAGNOSIS — Z9981 Dependence on supplemental oxygen: Secondary | ICD-10-CM | POA: Insufficient documentation

## 2024-10-18 DIAGNOSIS — Z7902 Long term (current) use of antithrombotics/antiplatelets: Secondary | ICD-10-CM | POA: Insufficient documentation

## 2024-10-18 DIAGNOSIS — K029 Dental caries, unspecified: Secondary | ICD-10-CM | POA: Diagnosis present

## 2024-10-18 DIAGNOSIS — G4730 Sleep apnea, unspecified: Secondary | ICD-10-CM | POA: Insufficient documentation

## 2024-10-18 DIAGNOSIS — I2510 Atherosclerotic heart disease of native coronary artery without angina pectoris: Secondary | ICD-10-CM | POA: Diagnosis not present

## 2024-10-18 DIAGNOSIS — Z955 Presence of coronary angioplasty implant and graft: Secondary | ICD-10-CM | POA: Diagnosis not present

## 2024-10-18 DIAGNOSIS — M1990 Unspecified osteoarthritis, unspecified site: Secondary | ICD-10-CM | POA: Diagnosis not present

## 2024-10-18 DIAGNOSIS — K0889 Other specified disorders of teeth and supporting structures: Secondary | ICD-10-CM

## 2024-10-18 DIAGNOSIS — I509 Heart failure, unspecified: Secondary | ICD-10-CM | POA: Insufficient documentation

## 2024-10-18 DIAGNOSIS — F17210 Nicotine dependence, cigarettes, uncomplicated: Secondary | ICD-10-CM

## 2024-10-18 DIAGNOSIS — K219 Gastro-esophageal reflux disease without esophagitis: Secondary | ICD-10-CM | POA: Diagnosis not present

## 2024-10-18 DIAGNOSIS — K056 Periodontal disease, unspecified: Secondary | ICD-10-CM | POA: Insufficient documentation

## 2024-10-18 DIAGNOSIS — E66813 Obesity, class 3: Secondary | ICD-10-CM | POA: Diagnosis not present

## 2024-10-18 DIAGNOSIS — I110 Hypertensive heart disease with heart failure: Secondary | ICD-10-CM | POA: Diagnosis not present

## 2024-10-18 DIAGNOSIS — Z6841 Body Mass Index (BMI) 40.0 and over, adult: Secondary | ICD-10-CM | POA: Diagnosis not present

## 2024-10-18 DIAGNOSIS — I5023 Acute on chronic systolic (congestive) heart failure: Secondary | ICD-10-CM

## 2024-10-18 HISTORY — DX: Dependence on supplemental oxygen: Z99.81

## 2024-10-18 HISTORY — PX: TOOTH EXTRACTION: SHX859

## 2024-10-18 LAB — BASIC METABOLIC PANEL WITH GFR
Anion gap: 12 (ref 5–15)
BUN: 12 mg/dL (ref 8–23)
CO2: 23 mmol/L (ref 22–32)
Calcium: 9.4 mg/dL (ref 8.9–10.3)
Chloride: 104 mmol/L (ref 98–111)
Creatinine, Ser: 1.31 mg/dL — ABNORMAL HIGH (ref 0.44–1.00)
GFR, Estimated: 46 mL/min — ABNORMAL LOW (ref >60)
Glucose, Bld: 100 mg/dL — ABNORMAL HIGH (ref 70–99)
Potassium: 3.9 mmol/L (ref 3.5–5.1)
Sodium: 139 mmol/L (ref 135–145)

## 2024-10-18 LAB — CBC
HCT: 39 % (ref 36.0–46.0)
Hemoglobin: 12.9 g/dL (ref 12.0–15.0)
MCH: 28.9 pg (ref 26.0–34.0)
MCHC: 33.1 g/dL (ref 30.0–36.0)
MCV: 87.2 fL (ref 80.0–100.0)
Platelets: 166 K/uL (ref 150–400)
RBC: 4.47 MIL/uL (ref 3.87–5.11)
RDW: 16.7 % — ABNORMAL HIGH (ref 11.5–15.5)
WBC: 6.9 K/uL (ref 4.0–10.5)
nRBC: 0 % (ref 0.0–0.2)

## 2024-10-18 SURGERY — DENTAL RESTORATION/EXTRACTIONS
Anesthesia: General | Site: Mouth

## 2024-10-18 MED ORDER — FENTANYL CITRATE (PF) 250 MCG/5ML IJ SOLN
INTRAMUSCULAR | Status: DC | PRN
Start: 2024-10-18 — End: 2024-10-18
  Administered 2024-10-18: 100 ug via INTRAVENOUS

## 2024-10-18 MED ORDER — MIDAZOLAM HCL (PF) 2 MG/2ML IJ SOLN
INTRAMUSCULAR | Status: DC | PRN
  Administered 2024-10-18 (×2): 1 mg via INTRAVENOUS

## 2024-10-18 MED ORDER — ORAL CARE MOUTH RINSE: 15.0000 mL | Freq: Once | OROMUCOSAL | Status: AC

## 2024-10-18 MED ORDER — DEXMEDETOMIDINE HCL IN NACL 80 MCG/20ML IV SOLN
INTRAVENOUS | Status: DC | PRN
  Administered 2024-10-18: 4 ug via INTRAVENOUS
  Administered 2024-10-18: 8 ug via INTRAVENOUS

## 2024-10-18 MED ORDER — ESMOLOL HCL 100 MG/10ML IV SOLN
INTRAVENOUS | Status: DC | PRN
  Administered 2024-10-18: 20 mg via INTRAVENOUS

## 2024-10-18 MED ORDER — LIDOCAINE 2% (20 MG/ML) 5 ML SYRINGE
INTRAMUSCULAR | Status: DC | PRN
  Administered 2024-10-18: 100 mg via INTRAVENOUS

## 2024-10-18 MED ORDER — CLINDAMYCIN HCL 300 MG PO CAPS: 300.0000 mg | ORAL_CAPSULE | Freq: Three times a day (TID) | ORAL | 0 refills | Status: AC

## 2024-10-18 MED ORDER — DEXAMETHASONE SOD PHOSPHATE PF 10 MG/ML IJ SOLN
INTRAMUSCULAR | Status: DC | PRN
  Administered 2024-10-18: 5 mg via INTRAVENOUS

## 2024-10-18 MED ORDER — CEFAZOLIN SODIUM-DEXTROSE 2-4 GM/100ML-% IV SOLN
2.0000 g | INTRAVENOUS | Status: AC
  Administered 2024-10-18: 2 g via INTRAVENOUS
  Filled 2024-10-18: qty 100

## 2024-10-18 MED ORDER — MIDAZOLAM HCL 2 MG/2ML IJ SOLN
INTRAMUSCULAR | Status: AC
  Filled 2024-10-18: qty 2

## 2024-10-18 MED ORDER — OXYCODONE HCL 5 MG PO TABS
ORAL_TABLET | ORAL | Status: AC
  Filled 2024-10-18: qty 1

## 2024-10-18 MED ORDER — ROCURONIUM BROMIDE 10 MG/ML (PF) SYRINGE
PREFILLED_SYRINGE | INTRAVENOUS | Status: DC | PRN
  Administered 2024-10-18: 60 mg via INTRAVENOUS

## 2024-10-18 MED ORDER — 0.9 % SODIUM CHLORIDE (POUR BTL) OPTIME
TOPICAL | Status: DC | PRN
  Administered 2024-10-18: 1000 mL

## 2024-10-18 MED ORDER — LIDOCAINE-EPINEPHRINE 2 %-1:100000 IJ SOLN
INTRAMUSCULAR | Status: DC | PRN
  Administered 2024-10-18: 20 mL

## 2024-10-18 MED ORDER — PROPOFOL 10 MG/ML IV BOLUS
INTRAVENOUS | Status: AC
  Filled 2024-10-18: qty 20

## 2024-10-18 MED ORDER — LACTATED RINGERS IV SOLN: INTRAVENOUS | Status: DC

## 2024-10-18 MED ORDER — ROCURONIUM BROMIDE 10 MG/ML (PF) SYRINGE
PREFILLED_SYRINGE | INTRAVENOUS | Status: AC
  Filled 2024-10-18: qty 10

## 2024-10-18 MED ORDER — PROPOFOL 10 MG/ML IV BOLUS
INTRAVENOUS | Status: DC | PRN
  Administered 2024-10-18: 50 mg via INTRAVENOUS
  Administered 2024-10-18: 150 mg via INTRAVENOUS

## 2024-10-18 MED ORDER — ONDANSETRON HCL 4 MG/2ML IJ SOLN
INTRAMUSCULAR | Status: AC
  Filled 2024-10-18: qty 2

## 2024-10-18 MED ORDER — ACETAMINOPHEN 10 MG/ML IV SOLN: 1000.0000 mg | Freq: Once | INTRAVENOUS | Status: DC | PRN

## 2024-10-18 MED ORDER — ONDANSETRON HCL 4 MG/2ML IJ SOLN: 4.0000 mg | Freq: Once | INTRAMUSCULAR | Status: DC | PRN

## 2024-10-18 MED ORDER — OXYCODONE-ACETAMINOPHEN 5-325 MG PO TABS
1.0000 | ORAL_TABLET | ORAL | 0 refills | Status: AC | PRN
Start: 2024-10-18 — End: ?

## 2024-10-18 MED ORDER — OXYCODONE HCL 5 MG/5ML PO SOLN: 5.0000 mg | Freq: Once | ORAL | Status: AC | PRN

## 2024-10-18 MED ORDER — SODIUM CHLORIDE 0.9 % IR SOLN
Status: DC | PRN
  Administered 2024-10-18: 1000 mL

## 2024-10-18 MED ORDER — CHLORHEXIDINE GLUCONATE 0.12 % MT SOLN
15.0000 mL | Freq: Once | OROMUCOSAL | Status: AC
  Administered 2024-10-18: 15 mL via OROMUCOSAL
  Filled 2024-10-18: qty 15

## 2024-10-18 MED ORDER — LABETALOL HCL 5 MG/ML IV SOLN
INTRAVENOUS | Status: DC | PRN
  Administered 2024-10-18 (×2): 2.5 mg via INTRAVENOUS

## 2024-10-18 MED ORDER — LIDOCAINE-EPINEPHRINE 2 %-1:100000 IJ SOLN
INTRAMUSCULAR | Status: AC
  Filled 2024-10-18: qty 1

## 2024-10-18 MED ORDER — PROPOFOL 10 MG/ML IV BOLUS
INTRAVENOUS | Status: AC
Start: 2024-10-18 — End: 2024-10-18
  Filled 2024-10-18: qty 20

## 2024-10-18 MED ORDER — LIDOCAINE 2% (20 MG/ML) 5 ML SYRINGE
INTRAMUSCULAR | Status: AC
  Filled 2024-10-18: qty 5

## 2024-10-18 MED ORDER — AMISULPRIDE (ANTIEMETIC) 5 MG/2ML IV SOLN
10.0000 mg | Freq: Once | INTRAVENOUS | Status: DC | PRN
Start: 2024-10-18 — End: 2024-10-18

## 2024-10-18 MED ORDER — OXYCODONE HCL 5 MG PO TABS
5.0000 mg | ORAL_TABLET | Freq: Once | ORAL | Status: AC | PRN
  Administered 2024-10-18: 5 mg via ORAL

## 2024-10-18 MED ORDER — OXYMETAZOLINE HCL 0.05 % NA SOLN
NASAL | Status: DC | PRN
  Administered 2024-10-18: 2 via NASAL

## 2024-10-18 MED ORDER — FENTANYL CITRATE (PF) 100 MCG/2ML IJ SOLN
INTRAMUSCULAR | Status: AC
  Filled 2024-10-18: qty 2

## 2024-10-18 MED ORDER — SUGAMMADEX SODIUM 200 MG/2ML IV SOLN
INTRAVENOUS | Status: DC | PRN
  Administered 2024-10-18: 200 mg via INTRAVENOUS

## 2024-10-18 MED ORDER — FENTANYL CITRATE (PF) 100 MCG/2ML IJ SOLN: 25.0000 ug | INTRAMUSCULAR | Status: DC | PRN

## 2024-10-18 MED ORDER — ONDANSETRON HCL 4 MG/2ML IJ SOLN
INTRAMUSCULAR | Status: DC | PRN
  Administered 2024-10-18: 4 mg via INTRAVENOUS

## 2024-10-18 SURGICAL SUPPLY — 26 items
BAG COUNTER SPONGE SURGICOUNT (BAG) IMPLANT
BLADE SURG 15 STRL LF DISP TIS (BLADE) ×1 IMPLANT
BUR CROSS CUT FISSURE 1.6 (BURR) ×1 IMPLANT
BUR EGG ELITE 4.0 (BURR) IMPLANT
BUR SRG MED 1.2XXCUT FSSR (BURR) IMPLANT
CANISTER SUCTION 3000ML PPV (SUCTIONS) ×1 IMPLANT
COVER SURGICAL LIGHT HANDLE (MISCELLANEOUS) ×1 IMPLANT
GAUZE PACKING FOLDED 2 STR (GAUZE/BANDAGES/DRESSINGS) ×1 IMPLANT
GLOVE BIO SURGEON STRL SZ8 (GLOVE) ×1 IMPLANT
GOWN STRL REUS W/ TWL LRG LVL3 (GOWN DISPOSABLE) ×1 IMPLANT
GOWN STRL REUS W/ TWL XL LVL3 (GOWN DISPOSABLE) ×1 IMPLANT
IV 0.9% NACL 1000 ML (IV SOLUTION) ×1 IMPLANT
KIT BASIN OR (CUSTOM PROCEDURE TRAY) ×1 IMPLANT
KIT TURNOVER KIT B (KITS) ×1 IMPLANT
NDL HYPO 25GX1X1/2 BEV (NEEDLE) ×2 IMPLANT
PAD ARMBOARD POSITIONER FOAM (MISCELLANEOUS) ×1 IMPLANT
SLEEVE IRRIGATION ELITE 7 (MISCELLANEOUS) ×1 IMPLANT
SOLN 0.9% NACL POUR BTL 1000ML (IV SOLUTION) ×1 IMPLANT
SPIKE FLUID TRANSFER (MISCELLANEOUS) IMPLANT
SUT CHROMIC 3 0 SH 27 (SUTURE) IMPLANT
SUT CHROMIC 4 0 RB 1X27 (SUTURE) ×1 IMPLANT
SYR BULB IRRIG 60ML STRL (SYRINGE) ×1 IMPLANT
SYR CONTROL 10ML LL (SYRINGE) ×1 IMPLANT
TRAY ENT MC OR (CUSTOM PROCEDURE TRAY) ×1 IMPLANT
TUBING IRRIGATION (MISCELLANEOUS) ×1 IMPLANT
YANKAUER SUCT BULB TIP NO VENT (SUCTIONS) ×1 IMPLANT

## 2024-10-18 NOTE — Anesthesia Procedure Notes (Addendum)
 Procedure Name: Intubation Date/Time: 10/18/2024 11:27 AM  Performed by: Jolynn Mage, CRNAPre-anesthesia Checklist: Patient identified, Emergency Drugs available, Suction available and Patient being monitored Patient Re-evaluated:Patient Re-evaluated prior to induction Oxygen  Delivery Method: Circle system utilized Preoxygenation: Pre-oxygenation with 100% oxygen  Induction Type: IV induction Ventilation: Mask ventilation without difficulty Laryngoscope Size: Mac, Glidescope and 3 Grade View: Grade I Nasal Tubes: Nasal prep performed, Nasal Rae and Left Number of attempts: 1 Airway Equipment and Method: Video-laryngoscopy Placement Confirmation: ETT inserted through vocal cords under direct vision, positive ETCO2 and breath sounds checked- equal and bilateral Tube secured with: Tape Dental Injury: Teeth and Oropharynx as per pre-operative assessment  Difficulty Due To: Difficult Airway- due to dentition, Difficult Airway- due to limited oral opening and Difficult Airway- due to large tongue

## 2024-10-18 NOTE — H&P (Signed)
 H&P documentation  -History and Physical Reviewed  -Patient has been re-examined  -No change in the plan of care  Tracey Manning

## 2024-10-18 NOTE — Anesthesia Postprocedure Evaluation (Signed)
 Anesthesia Post Note  Patient: Tracey Manning  Procedure(s) Performed: EXTRACTION TEETH NUMBER 17, 20, 21, 22, 23, 24, 25, 26, 30, 32; ALVEOLOPLASTY (Mouth)     Patient location during evaluation: PACU Anesthesia Type: General Level of consciousness: awake and alert Pain management: pain level controlled Vital Signs Assessment: post-procedure vital signs reviewed and stable Respiratory status: spontaneous breathing, nonlabored ventilation, respiratory function stable and patient connected to nasal cannula oxygen  Cardiovascular status: blood pressure returned to baseline and stable Postop Assessment: no apparent nausea or vomiting Anesthetic complications: no   There were no known notable events for this encounter.  Last Vitals:  Vitals:   10/18/24 1217 10/18/24 1230  BP: 132/87 136/74  Pulse: 86 83  Resp: 18 20  Temp: 36.6 C   SpO2: 90% 92%    Last Pain:  Vitals:   10/18/24 1240  PainSc: 8                  Garnette DELENA Gab

## 2024-10-18 NOTE — Transfer of Care (Signed)
 Immediate Anesthesia Transfer of Care Note  Patient: Tracey Manning  Procedure(s) Performed: EXTRACTION TEETH NUMBER 17, 20, 21, 22, 23, 24, 25, 26, 30, 32; ALVEOLOPLASTY (Mouth)  Patient Location: PACU  Anesthesia Type:General  Level of Consciousness: awake, alert , and oriented  Airway & Oxygen  Therapy: Patient Spontanous Breathing and Patient connected to nasal cannula oxygen   Post-op Assessment: Report given to RN and Post -op Vital signs reviewed and stable  Post vital signs: Reviewed and stable  Last Vitals:  Vitals Value Taken Time  BP 132/87 10/18/24 12:17  Temp 36.6 C 10/18/24 12:17  Pulse 86 10/18/24 12:21  Resp 18 10/18/24 12:21  SpO2 92 % 10/18/24 12:21  Vitals shown include unfiled device data.  Last Pain:  Vitals:   10/18/24 0813  PainSc: 2       Patients Stated Pain Goal: 0 (10/18/24 0813)  Complications: No notable events documented.

## 2024-10-18 NOTE — Op Note (Signed)
 10/18/2024  12:05 PM  PATIENT:  Tracey Manning  63 y.o. female  PRE-OPERATIVE DIAGNOSIS:  NON RESTORABLE TEETH NUMBER 17, 20, 21, 22, 23, 24, 25, 26, 27, 28, 29, 30, 32; secondary to dental caries and periodontal disease  POST-OPERATIVE DIAGNOSIS:  SAME  PROCEDURE:  Procedure(s): EXTRACTION TEETH NUMBER 17, 20, 21, 22, 23, 24, 25, 26, 27, 28, 29, 30, 32; ALVEOLOPLASTY  SURGEON:  Surgeon(s): Sheryle Hamilton, DMD  ANESTHESIA:   local and general  EBL:  minimal  DRAINS: none   SPECIMEN:  No Specimen  COUNTS:  YES  PLAN OF CARE: Discharge to home after PACU  PATIENT DISPOSITION:  PACU - hemodynamically stable.   PROCEDURE DETAILS: Dictation # [id number]  Hamilton EMERSON Sheryle, DMD 10/18/2024 12:05 PM

## 2024-10-18 NOTE — Op Note (Signed)
 Tracey, Manning MEDICAL RECORD NO: [id number] ACCOUNT NO: [id number] DATE OF BIRTH: 06/19/1961 FACILITY: [facility] LOCATION: [facility] PHYSICIAN: [name], DDS  Operative Report   DATE OF PROCEDURE: 10/18/2024  PREOPERATIVE DIAGNOSIS:  Nonrestorable teeth numbers 17, 20, 21, 22, 23, 24, 25, 26, 27, 28, 29, 30, 32 secondary to dental caries and periodontal disease.  POSTOPERATIVE DIAGNOSIS:  Nonrestorable teeth numbers 17, 20, 21, 22, 23, 24, 25, 26, 27, 28, 29, 30, 32 secondary to dental caries and periodontal disease.  PROCEDURE: 1.  Extraction of teeth numbers 17, 20, 21, 22, 23, 24, 25, 26, 27, 28, 29, 30 and 32. 2.  Alveoloplasty, right and left mandible.  SURGEON:  [name], DDS  ANESTHESIA:  General nasal intubation, Dr. Jefm attending.  DESCRIPTION OF PROCEDURE:  The patient was taken to the operating room and placed on the table in the supine position.  General anesthesia was administered.  A nasal endotracheal tube was placed and secured.  The eyes were protected.  The patient was  draped for surgery.  Timeout was performed.  The posterior pharynx was suctioned and a throat pack was placed.  2% lidocaine  1:100,000 epinephrine  was infiltrated in an inferior alveolar block on the right and left sides with buccal infiltration in the anterior mandible.  The left side was operated first.  A 15 blade was used to make an incision around tooth #17 carried forward to tooth #20 and then an incision was created from tooth #20 to tooth #26 in the buccal and in the lingual sulcus of the teeth.  The periosteum  was reflected.  Teeth numbers 26, 25, 24, 23, 22, 21 and 20 were elevated with a dental elevator and removed from the mouth with the dental forceps.  Tooth #17 could not be luxated with the elevator.  The Stryker handpiece was used with a fissure bur to  remove buccal bone and interproximal bone around the tooth.  Then the tooth was elevated and removed with the dental  forceps.  The root of #17 fractured and was removed using the Stryker handpiece to remove additional bone around the root tip and then  the root was removed.  Then the sockets were curetted.  The periosteum was reflected to expose the alveolar crest, which is irregular in contour.  Alveoloplasty was then performed using the egg bur followed by the bone file and then the area was  irrigated and closed with 3-0 chromic.  Then attention was turned to the right side.  The 15 blade was used to make an incision beginning at tooth #32 in the gingival sulcus carried forward to tooth #30 and then the incision was carried forward to tooth #27 in the buccal and lingual gingival  sulcus.  The periosteum was reflected.  The teeth were elevated with the 301 elevator and removed from the mouth with the dental forceps.  The sockets were curetted and the periosteum was reflected to expose the alveolar crest.  Alveoloplasty was  performed using the egg bur followed by the bone file then the area was irrigated and closed with 3-0 chromic.  The oral cavity was then irrigated and suctioned.  The throat pack was removed.  The patient was left in care of anesthesia for extubation, transport to recovery, and discharge through day surgery.  ESTIMATED BLOOD LOSS:  Minimal.  COMPLICATIONS:  None.  SPECIMENS:  None.  COUNTS:  Correct.   PUS D: 10/18/2024 12:09:49 pm T: 10/18/2024 12:40:00 pm  JOB: [id number]/ [id number]

## 2024-10-19 ENCOUNTER — Encounter (HOSPITAL_COMMUNITY): Payer: Self-pay | Admitting: Oral Surgery
